# Patient Record
Sex: Female | Born: 1945
Health system: Southern US, Community
[De-identification: ages and names within clinical notes are randomized; demographics above are authoritative.]

## PROBLEM LIST (undated history)

## (undated) DIAGNOSIS — R7303 Prediabetes: Secondary | ICD-10-CM

## (undated) DIAGNOSIS — I219 Acute myocardial infarction, unspecified: Secondary | ICD-10-CM

## (undated) DIAGNOSIS — F419 Anxiety disorder, unspecified: Secondary | ICD-10-CM

## (undated) DIAGNOSIS — S92901A Unspecified fracture of right foot, initial encounter for closed fracture: Secondary | ICD-10-CM

## (undated) DIAGNOSIS — R42 Dizziness and giddiness: Secondary | ICD-10-CM

## (undated) DIAGNOSIS — G4730 Sleep apnea, unspecified: Secondary | ICD-10-CM

## (undated) DIAGNOSIS — K219 Gastro-esophageal reflux disease without esophagitis: Secondary | ICD-10-CM

## (undated) DIAGNOSIS — M1990 Unspecified osteoarthritis, unspecified site: Secondary | ICD-10-CM

## (undated) DIAGNOSIS — E039 Hypothyroidism, unspecified: Secondary | ICD-10-CM

## (undated) DIAGNOSIS — E079 Disorder of thyroid, unspecified: Secondary | ICD-10-CM

## (undated) DIAGNOSIS — I2510 Atherosclerotic heart disease of native coronary artery without angina pectoris: Secondary | ICD-10-CM

## (undated) DIAGNOSIS — H04123 Dry eye syndrome of bilateral lacrimal glands: Secondary | ICD-10-CM

## (undated) DIAGNOSIS — E785 Hyperlipidemia, unspecified: Secondary | ICD-10-CM

## (undated) DIAGNOSIS — Z9889 Other specified postprocedural states: Secondary | ICD-10-CM

## (undated) DIAGNOSIS — Z8639 Personal history of other endocrine, nutritional and metabolic disease: Secondary | ICD-10-CM

## (undated) DIAGNOSIS — I10 Essential (primary) hypertension: Secondary | ICD-10-CM

## (undated) DIAGNOSIS — M545 Low back pain: Secondary | ICD-10-CM

## (undated) DIAGNOSIS — J439 Emphysema, unspecified: Secondary | ICD-10-CM

## (undated) DIAGNOSIS — N200 Calculus of kidney: Secondary | ICD-10-CM

## (undated) DIAGNOSIS — Z98811 Dental restoration status: Secondary | ICD-10-CM

## (undated) DIAGNOSIS — M5136 Other intervertebral disc degeneration, lumbar region: Secondary | ICD-10-CM

## (undated) DIAGNOSIS — T7840XA Allergy, unspecified, initial encounter: Secondary | ICD-10-CM

## (undated) DIAGNOSIS — M797 Fibromyalgia: Secondary | ICD-10-CM

## (undated) DIAGNOSIS — M5432 Sciatica, left side: Secondary | ICD-10-CM

## (undated) DIAGNOSIS — M069 Rheumatoid arthritis, unspecified: Secondary | ICD-10-CM

## (undated) DIAGNOSIS — G8929 Other chronic pain: Secondary | ICD-10-CM

## (undated) DIAGNOSIS — M7581 Other shoulder lesions, right shoulder: Secondary | ICD-10-CM

## (undated) HISTORY — PX: DILATION AND CURETTAGE OF UTERUS: SHX78

## (undated) HISTORY — DX: Dry eye syndrome of bilateral lacrimal glands: H04.123

## (undated) HISTORY — DX: Rheumatoid arthritis, unspecified: M06.9

## (undated) HISTORY — DX: Calculus of kidney: N20.0

## (undated) HISTORY — PX: ABDOMINAL HYSTERECTOMY: SHX81

## (undated) HISTORY — PX: BREAST BIOPSY: SHX20

## (undated) HISTORY — DX: Hyperlipidemia, unspecified: E78.5

## (undated) HISTORY — PX: BREAST CYST ASPIRATION: SHX578

## (undated) HISTORY — PX: TOOTH EXTRACTION: SUR596

## (undated) HISTORY — DX: Emphysema, unspecified: J43.9

## (undated) HISTORY — DX: Disorder of thyroid, unspecified: E07.9

## (undated) HISTORY — PX: EYE SURGERY: SHX253

## (undated) HISTORY — DX: Other intervertebral disc degeneration, lumbar region: M51.36

## (undated) HISTORY — PX: BREAST EXCISIONAL BIOPSY: SUR124

## (undated) HISTORY — PX: APPENDECTOMY: SHX54

## (undated) HISTORY — PX: OTHER SURGICAL HISTORY: SHX169

## (undated) HISTORY — DX: Acute myocardial infarction, unspecified: I21.9

## (undated) HISTORY — DX: Other shoulder lesions, right shoulder: M75.81

## (undated) HISTORY — DX: Gastro-esophageal reflux disease without esophagitis: K21.9

## (undated) HISTORY — DX: Unspecified osteoarthritis, unspecified site: M19.90

## (undated) HISTORY — PX: BUNIONECTOMY: SHX129

## (undated) HISTORY — DX: Other chronic pain: G89.29

## (undated) HISTORY — DX: Fibromyalgia: M79.7

## (undated) HISTORY — DX: Other specified postprocedural states: Z98.890

## (undated) HISTORY — DX: Allergy, unspecified, initial encounter: T78.40XA

## (undated) HISTORY — DX: Essential (primary) hypertension: I10

## (undated) HISTORY — DX: Low back pain: M54.5

## (undated) HISTORY — DX: Unspecified fracture of right foot, initial encounter for closed fracture: S92.901A

---
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
## 2005-04-29 ENCOUNTER — Ambulatory Visit: Payer: Self-pay | Admitting: Internal Medicine

## 2006-04-15 HISTORY — PX: CORONARY ANGIOPLASTY WITH STENT PLACEMENT: SHX49

## 2006-04-22 ENCOUNTER — Other Ambulatory Visit: Payer: Self-pay

## 2006-04-22 ENCOUNTER — Emergency Department: Payer: Self-pay | Admitting: Emergency Medicine

## 2006-04-22 DIAGNOSIS — I219 Acute myocardial infarction, unspecified: Secondary | ICD-10-CM

## 2006-04-22 HISTORY — DX: Acute myocardial infarction, unspecified: I21.9

## 2006-07-12 ENCOUNTER — Encounter: Payer: Self-pay | Admitting: Internal Medicine

## 2008-03-15 HISTORY — PX: COLONOSCOPY: SHX174

## 2008-03-16 LAB — HM COLONOSCOPY: HM COLON: NORMAL

## 2008-04-27 ENCOUNTER — Observation Stay: Payer: Self-pay | Admitting: Internal Medicine

## 2009-07-08 ENCOUNTER — Ambulatory Visit: Payer: Self-pay | Admitting: Internal Medicine

## 2009-11-11 ENCOUNTER — Ambulatory Visit: Payer: Self-pay | Admitting: Internal Medicine

## 2009-11-27 ENCOUNTER — Ambulatory Visit: Payer: Self-pay | Admitting: Pain Medicine

## 2009-12-03 ENCOUNTER — Ambulatory Visit: Payer: Self-pay | Admitting: Pain Medicine

## 2010-01-08 ENCOUNTER — Ambulatory Visit: Payer: Self-pay | Admitting: Pain Medicine

## 2010-01-19 ENCOUNTER — Ambulatory Visit: Payer: Self-pay | Admitting: Pain Medicine

## 2010-02-16 ENCOUNTER — Ambulatory Visit: Payer: Self-pay | Admitting: Pain Medicine

## 2011-01-11 ENCOUNTER — Ambulatory Visit: Payer: Self-pay | Admitting: Pain Medicine

## 2011-02-11 ENCOUNTER — Ambulatory Visit: Payer: Self-pay | Admitting: Pain Medicine

## 2011-02-22 ENCOUNTER — Ambulatory Visit: Payer: Self-pay | Admitting: Pain Medicine

## 2011-05-10 DIAGNOSIS — R21 Rash and other nonspecific skin eruption: Secondary | ICD-10-CM | POA: Diagnosis not present

## 2011-05-10 DIAGNOSIS — L0100 Impetigo, unspecified: Secondary | ICD-10-CM | POA: Diagnosis not present

## 2011-06-07 DIAGNOSIS — R21 Rash and other nonspecific skin eruption: Secondary | ICD-10-CM | POA: Diagnosis not present

## 2011-06-07 DIAGNOSIS — L708 Other acne: Secondary | ICD-10-CM | POA: Diagnosis not present

## 2011-06-07 DIAGNOSIS — L408 Other psoriasis: Secondary | ICD-10-CM | POA: Diagnosis not present

## 2011-06-07 DIAGNOSIS — L723 Sebaceous cyst: Secondary | ICD-10-CM | POA: Diagnosis not present

## 2011-09-01 DIAGNOSIS — I10 Essential (primary) hypertension: Secondary | ICD-10-CM | POA: Diagnosis not present

## 2011-09-01 DIAGNOSIS — E782 Mixed hyperlipidemia: Secondary | ICD-10-CM | POA: Diagnosis not present

## 2011-09-01 DIAGNOSIS — Z0000 Encounter for general adult medical examination without abnormal findings: Secondary | ICD-10-CM | POA: Diagnosis not present

## 2011-09-08 DIAGNOSIS — Z23 Encounter for immunization: Secondary | ICD-10-CM | POA: Diagnosis not present

## 2011-09-08 DIAGNOSIS — G894 Chronic pain syndrome: Secondary | ICD-10-CM | POA: Diagnosis not present

## 2011-09-08 DIAGNOSIS — M19049 Primary osteoarthritis, unspecified hand: Secondary | ICD-10-CM | POA: Diagnosis not present

## 2011-09-08 DIAGNOSIS — Z0000 Encounter for general adult medical examination without abnormal findings: Secondary | ICD-10-CM | POA: Diagnosis not present

## 2011-11-22 DIAGNOSIS — R0602 Shortness of breath: Secondary | ICD-10-CM | POA: Diagnosis not present

## 2011-11-22 DIAGNOSIS — I2510 Atherosclerotic heart disease of native coronary artery without angina pectoris: Secondary | ICD-10-CM | POA: Diagnosis not present

## 2011-11-22 DIAGNOSIS — I252 Old myocardial infarction: Secondary | ICD-10-CM | POA: Diagnosis not present

## 2011-11-22 DIAGNOSIS — I119 Hypertensive heart disease without heart failure: Secondary | ICD-10-CM | POA: Diagnosis not present

## 2011-12-02 DIAGNOSIS — R0602 Shortness of breath: Secondary | ICD-10-CM | POA: Diagnosis not present

## 2011-12-20 DIAGNOSIS — I059 Rheumatic mitral valve disease, unspecified: Secondary | ICD-10-CM | POA: Diagnosis not present

## 2011-12-20 DIAGNOSIS — I2510 Atherosclerotic heart disease of native coronary artery without angina pectoris: Secondary | ICD-10-CM | POA: Diagnosis not present

## 2011-12-20 DIAGNOSIS — E782 Mixed hyperlipidemia: Secondary | ICD-10-CM | POA: Diagnosis not present

## 2011-12-20 DIAGNOSIS — I10 Essential (primary) hypertension: Secondary | ICD-10-CM | POA: Diagnosis not present

## 2012-01-31 DIAGNOSIS — H16229 Keratoconjunctivitis sicca, not specified as Sjogren's, unspecified eye: Secondary | ICD-10-CM | POA: Diagnosis not present

## 2012-01-31 DIAGNOSIS — H43399 Other vitreous opacities, unspecified eye: Secondary | ICD-10-CM | POA: Diagnosis not present

## 2012-06-14 DIAGNOSIS — H16299 Other keratoconjunctivitis, unspecified eye: Secondary | ICD-10-CM | POA: Diagnosis not present

## 2012-06-19 DIAGNOSIS — H16229 Keratoconjunctivitis sicca, not specified as Sjogren's, unspecified eye: Secondary | ICD-10-CM | POA: Diagnosis not present

## 2012-06-27 DIAGNOSIS — H16209 Unspecified keratoconjunctivitis, unspecified eye: Secondary | ICD-10-CM | POA: Diagnosis not present

## 2012-07-10 DIAGNOSIS — I119 Hypertensive heart disease without heart failure: Secondary | ICD-10-CM | POA: Diagnosis not present

## 2012-07-10 DIAGNOSIS — I2510 Atherosclerotic heart disease of native coronary artery without angina pectoris: Secondary | ICD-10-CM | POA: Diagnosis not present

## 2012-07-10 DIAGNOSIS — E782 Mixed hyperlipidemia: Secondary | ICD-10-CM | POA: Diagnosis not present

## 2012-07-18 ENCOUNTER — Ambulatory Visit: Payer: Self-pay | Admitting: Family Medicine

## 2012-07-18 DIAGNOSIS — M25579 Pain in unspecified ankle and joints of unspecified foot: Secondary | ICD-10-CM | POA: Diagnosis not present

## 2012-07-18 DIAGNOSIS — S93409A Sprain of unspecified ligament of unspecified ankle, initial encounter: Secondary | ICD-10-CM | POA: Diagnosis not present

## 2012-07-24 DIAGNOSIS — H16109 Unspecified superficial keratitis, unspecified eye: Secondary | ICD-10-CM | POA: Diagnosis not present

## 2012-08-09 DIAGNOSIS — H16109 Unspecified superficial keratitis, unspecified eye: Secondary | ICD-10-CM | POA: Diagnosis not present

## 2012-08-22 DIAGNOSIS — H16109 Unspecified superficial keratitis, unspecified eye: Secondary | ICD-10-CM | POA: Diagnosis not present

## 2012-09-08 DIAGNOSIS — E039 Hypothyroidism, unspecified: Secondary | ICD-10-CM | POA: Insufficient documentation

## 2012-09-20 DIAGNOSIS — E039 Hypothyroidism, unspecified: Secondary | ICD-10-CM | POA: Diagnosis not present

## 2012-09-22 DIAGNOSIS — Z23 Encounter for immunization: Secondary | ICD-10-CM | POA: Diagnosis not present

## 2012-09-22 DIAGNOSIS — E039 Hypothyroidism, unspecified: Secondary | ICD-10-CM | POA: Diagnosis not present

## 2012-09-22 DIAGNOSIS — M19049 Primary osteoarthritis, unspecified hand: Secondary | ICD-10-CM | POA: Insufficient documentation

## 2012-09-22 DIAGNOSIS — E782 Mixed hyperlipidemia: Secondary | ICD-10-CM | POA: Insufficient documentation

## 2012-09-22 DIAGNOSIS — I2510 Atherosclerotic heart disease of native coronary artery without angina pectoris: Secondary | ICD-10-CM | POA: Diagnosis not present

## 2012-09-22 DIAGNOSIS — Z0000 Encounter for general adult medical examination without abnormal findings: Secondary | ICD-10-CM | POA: Diagnosis not present

## 2012-09-22 DIAGNOSIS — E785 Hyperlipidemia, unspecified: Secondary | ICD-10-CM | POA: Diagnosis not present

## 2012-09-24 DIAGNOSIS — M1810 Unilateral primary osteoarthritis of first carpometacarpal joint, unspecified hand: Secondary | ICD-10-CM | POA: Insufficient documentation

## 2012-10-11 ENCOUNTER — Ambulatory Visit: Payer: Self-pay | Admitting: Internal Medicine

## 2012-10-11 DIAGNOSIS — Z1231 Encounter for screening mammogram for malignant neoplasm of breast: Secondary | ICD-10-CM | POA: Diagnosis not present

## 2013-01-02 DIAGNOSIS — I10 Essential (primary) hypertension: Secondary | ICD-10-CM | POA: Diagnosis not present

## 2013-01-02 DIAGNOSIS — I059 Rheumatic mitral valve disease, unspecified: Secondary | ICD-10-CM | POA: Diagnosis not present

## 2013-01-02 DIAGNOSIS — I2510 Atherosclerotic heart disease of native coronary artery without angina pectoris: Secondary | ICD-10-CM | POA: Diagnosis not present

## 2013-01-02 DIAGNOSIS — E785 Hyperlipidemia, unspecified: Secondary | ICD-10-CM | POA: Diagnosis not present

## 2013-07-03 DIAGNOSIS — H6120 Impacted cerumen, unspecified ear: Secondary | ICD-10-CM | POA: Diagnosis not present

## 2013-07-17 DIAGNOSIS — IMO0001 Reserved for inherently not codable concepts without codable children: Secondary | ICD-10-CM | POA: Diagnosis not present

## 2013-07-17 DIAGNOSIS — M159 Polyosteoarthritis, unspecified: Secondary | ICD-10-CM | POA: Diagnosis not present

## 2013-07-17 DIAGNOSIS — E559 Vitamin D deficiency, unspecified: Secondary | ICD-10-CM | POA: Diagnosis not present

## 2013-07-17 DIAGNOSIS — E039 Hypothyroidism, unspecified: Secondary | ICD-10-CM | POA: Diagnosis not present

## 2013-08-29 DIAGNOSIS — J018 Other acute sinusitis: Secondary | ICD-10-CM | POA: Diagnosis not present

## 2013-08-29 DIAGNOSIS — R05 Cough: Secondary | ICD-10-CM | POA: Diagnosis not present

## 2013-08-29 DIAGNOSIS — R059 Cough, unspecified: Secondary | ICD-10-CM | POA: Diagnosis not present

## 2013-09-25 DIAGNOSIS — M19049 Primary osteoarthritis, unspecified hand: Secondary | ICD-10-CM | POA: Diagnosis not present

## 2013-09-25 DIAGNOSIS — M2550 Pain in unspecified joint: Secondary | ICD-10-CM | POA: Diagnosis not present

## 2013-09-25 DIAGNOSIS — G8929 Other chronic pain: Secondary | ICD-10-CM | POA: Diagnosis not present

## 2013-11-12 DIAGNOSIS — H2510 Age-related nuclear cataract, unspecified eye: Secondary | ICD-10-CM | POA: Diagnosis not present

## 2013-11-12 DIAGNOSIS — H5210 Myopia, unspecified eye: Secondary | ICD-10-CM | POA: Diagnosis not present

## 2013-11-28 DIAGNOSIS — I10 Essential (primary) hypertension: Secondary | ICD-10-CM | POA: Diagnosis not present

## 2013-11-28 DIAGNOSIS — I2510 Atherosclerotic heart disease of native coronary artery without angina pectoris: Secondary | ICD-10-CM | POA: Diagnosis not present

## 2013-11-28 DIAGNOSIS — E785 Hyperlipidemia, unspecified: Secondary | ICD-10-CM | POA: Diagnosis not present

## 2013-12-18 DIAGNOSIS — I2510 Atherosclerotic heart disease of native coronary artery without angina pectoris: Secondary | ICD-10-CM | POA: Diagnosis not present

## 2013-12-18 DIAGNOSIS — E785 Hyperlipidemia, unspecified: Secondary | ICD-10-CM | POA: Diagnosis not present

## 2013-12-18 DIAGNOSIS — K219 Gastro-esophageal reflux disease without esophagitis: Secondary | ICD-10-CM | POA: Diagnosis not present

## 2013-12-18 DIAGNOSIS — N6019 Diffuse cystic mastopathy of unspecified breast: Secondary | ICD-10-CM | POA: Diagnosis not present

## 2013-12-18 DIAGNOSIS — M19041 Primary osteoarthritis, right hand: Secondary | ICD-10-CM | POA: Diagnosis not present

## 2013-12-18 DIAGNOSIS — M797 Fibromyalgia: Secondary | ICD-10-CM | POA: Diagnosis not present

## 2013-12-18 DIAGNOSIS — E890 Postprocedural hypothyroidism: Secondary | ICD-10-CM | POA: Diagnosis not present

## 2013-12-18 DIAGNOSIS — Z23 Encounter for immunization: Secondary | ICD-10-CM | POA: Diagnosis not present

## 2013-12-18 DIAGNOSIS — I10 Essential (primary) hypertension: Secondary | ICD-10-CM | POA: Diagnosis not present

## 2013-12-25 DIAGNOSIS — M150 Primary generalized (osteo)arthritis: Secondary | ICD-10-CM | POA: Diagnosis not present

## 2013-12-25 DIAGNOSIS — M797 Fibromyalgia: Secondary | ICD-10-CM | POA: Diagnosis not present

## 2014-01-02 DIAGNOSIS — M25512 Pain in left shoulder: Secondary | ICD-10-CM | POA: Diagnosis not present

## 2014-01-09 DIAGNOSIS — M6281 Muscle weakness (generalized): Secondary | ICD-10-CM | POA: Diagnosis not present

## 2014-01-09 DIAGNOSIS — M25512 Pain in left shoulder: Secondary | ICD-10-CM | POA: Diagnosis not present

## 2014-01-09 DIAGNOSIS — M25612 Stiffness of left shoulder, not elsewhere classified: Secondary | ICD-10-CM | POA: Diagnosis not present

## 2014-01-14 DIAGNOSIS — M6281 Muscle weakness (generalized): Secondary | ICD-10-CM | POA: Diagnosis not present

## 2014-01-14 DIAGNOSIS — M25512 Pain in left shoulder: Secondary | ICD-10-CM | POA: Diagnosis not present

## 2014-01-14 DIAGNOSIS — M25612 Stiffness of left shoulder, not elsewhere classified: Secondary | ICD-10-CM | POA: Diagnosis not present

## 2014-01-17 DIAGNOSIS — M25512 Pain in left shoulder: Secondary | ICD-10-CM | POA: Diagnosis not present

## 2014-01-17 DIAGNOSIS — M6281 Muscle weakness (generalized): Secondary | ICD-10-CM | POA: Diagnosis not present

## 2014-01-17 DIAGNOSIS — M25612 Stiffness of left shoulder, not elsewhere classified: Secondary | ICD-10-CM | POA: Diagnosis not present

## 2014-01-22 DIAGNOSIS — M6281 Muscle weakness (generalized): Secondary | ICD-10-CM | POA: Diagnosis not present

## 2014-01-22 DIAGNOSIS — M25512 Pain in left shoulder: Secondary | ICD-10-CM | POA: Diagnosis not present

## 2014-01-22 DIAGNOSIS — M25612 Stiffness of left shoulder, not elsewhere classified: Secondary | ICD-10-CM | POA: Diagnosis not present

## 2014-01-25 DIAGNOSIS — M6281 Muscle weakness (generalized): Secondary | ICD-10-CM | POA: Diagnosis not present

## 2014-01-25 DIAGNOSIS — M25612 Stiffness of left shoulder, not elsewhere classified: Secondary | ICD-10-CM | POA: Diagnosis not present

## 2014-01-25 DIAGNOSIS — M25512 Pain in left shoulder: Secondary | ICD-10-CM | POA: Diagnosis not present

## 2014-01-28 DIAGNOSIS — M25612 Stiffness of left shoulder, not elsewhere classified: Secondary | ICD-10-CM | POA: Diagnosis not present

## 2014-01-28 DIAGNOSIS — M6281 Muscle weakness (generalized): Secondary | ICD-10-CM | POA: Diagnosis not present

## 2014-01-28 DIAGNOSIS — M25512 Pain in left shoulder: Secondary | ICD-10-CM | POA: Diagnosis not present

## 2014-01-30 DIAGNOSIS — E785 Hyperlipidemia, unspecified: Secondary | ICD-10-CM | POA: Diagnosis not present

## 2014-01-30 DIAGNOSIS — K219 Gastro-esophageal reflux disease without esophagitis: Secondary | ICD-10-CM | POA: Diagnosis not present

## 2014-01-30 DIAGNOSIS — E890 Postprocedural hypothyroidism: Secondary | ICD-10-CM | POA: Diagnosis not present

## 2014-01-30 DIAGNOSIS — Z0000 Encounter for general adult medical examination without abnormal findings: Secondary | ICD-10-CM | POA: Diagnosis not present

## 2014-01-30 DIAGNOSIS — I10 Essential (primary) hypertension: Secondary | ICD-10-CM | POA: Diagnosis not present

## 2014-01-30 DIAGNOSIS — Z23 Encounter for immunization: Secondary | ICD-10-CM | POA: Diagnosis not present

## 2014-01-30 LAB — LIPID PANEL
Cholesterol: 148 mg/dL (ref 0–200)
HDL: 46 mg/dL (ref 35–70)
LDL CALC: 73 mg/dL
TRIGLYCERIDES: 143 mg/dL (ref 40–160)

## 2014-01-30 LAB — BASIC METABOLIC PANEL
BUN: 23 mg/dL — AB (ref 4–21)
CREATININE: 0.9 mg/dL (ref <=1.1)

## 2014-01-30 LAB — CBC AND DIFFERENTIAL: Hemoglobin: 13.3 g/dL (ref 12.0–16.0)

## 2014-01-30 LAB — TSH: TSH: 1.52 u[IU]/mL (ref <=5.90)

## 2014-01-31 DIAGNOSIS — M25612 Stiffness of left shoulder, not elsewhere classified: Secondary | ICD-10-CM | POA: Diagnosis not present

## 2014-01-31 DIAGNOSIS — M25512 Pain in left shoulder: Secondary | ICD-10-CM | POA: Diagnosis not present

## 2014-02-05 DIAGNOSIS — M6281 Muscle weakness (generalized): Secondary | ICD-10-CM | POA: Diagnosis not present

## 2014-02-05 DIAGNOSIS — M25512 Pain in left shoulder: Secondary | ICD-10-CM | POA: Diagnosis not present

## 2014-02-13 DIAGNOSIS — R05 Cough: Secondary | ICD-10-CM | POA: Diagnosis not present

## 2014-02-13 DIAGNOSIS — J329 Chronic sinusitis, unspecified: Secondary | ICD-10-CM | POA: Diagnosis not present

## 2014-02-13 DIAGNOSIS — A499 Bacterial infection, unspecified: Secondary | ICD-10-CM | POA: Diagnosis not present

## 2014-03-20 ENCOUNTER — Ambulatory Visit: Payer: Self-pay | Admitting: Internal Medicine

## 2014-03-20 DIAGNOSIS — Z1231 Encounter for screening mammogram for malignant neoplasm of breast: Secondary | ICD-10-CM | POA: Diagnosis not present

## 2014-03-20 LAB — HM MAMMOGRAPHY: HM MAMMO: NORMAL

## 2014-05-07 DIAGNOSIS — B9689 Other specified bacterial agents as the cause of diseases classified elsewhere: Secondary | ICD-10-CM | POA: Diagnosis not present

## 2014-05-07 DIAGNOSIS — J329 Chronic sinusitis, unspecified: Secondary | ICD-10-CM | POA: Diagnosis not present

## 2014-06-05 DIAGNOSIS — L0291 Cutaneous abscess, unspecified: Secondary | ICD-10-CM | POA: Diagnosis not present

## 2014-06-05 DIAGNOSIS — L720 Epidermal cyst: Secondary | ICD-10-CM | POA: Diagnosis not present

## 2014-06-05 DIAGNOSIS — L300 Nummular dermatitis: Secondary | ICD-10-CM | POA: Diagnosis not present

## 2014-06-11 DIAGNOSIS — M150 Primary generalized (osteo)arthritis: Secondary | ICD-10-CM | POA: Diagnosis not present

## 2014-06-11 DIAGNOSIS — M797 Fibromyalgia: Secondary | ICD-10-CM | POA: Diagnosis not present

## 2014-07-01 DIAGNOSIS — I10 Essential (primary) hypertension: Secondary | ICD-10-CM | POA: Insufficient documentation

## 2014-07-09 DIAGNOSIS — I6523 Occlusion and stenosis of bilateral carotid arteries: Secondary | ICD-10-CM | POA: Diagnosis not present

## 2014-07-09 DIAGNOSIS — E782 Mixed hyperlipidemia: Secondary | ICD-10-CM | POA: Diagnosis not present

## 2014-07-09 DIAGNOSIS — I6529 Occlusion and stenosis of unspecified carotid artery: Secondary | ICD-10-CM | POA: Insufficient documentation

## 2014-07-09 DIAGNOSIS — I10 Essential (primary) hypertension: Secondary | ICD-10-CM | POA: Diagnosis not present

## 2014-07-09 DIAGNOSIS — I2510 Atherosclerotic heart disease of native coronary artery without angina pectoris: Secondary | ICD-10-CM | POA: Diagnosis not present

## 2014-07-16 DIAGNOSIS — L300 Nummular dermatitis: Secondary | ICD-10-CM | POA: Diagnosis not present

## 2014-07-16 DIAGNOSIS — L720 Epidermal cyst: Secondary | ICD-10-CM | POA: Diagnosis not present

## 2014-07-16 DIAGNOSIS — D485 Neoplasm of uncertain behavior of skin: Secondary | ICD-10-CM | POA: Diagnosis not present

## 2014-07-30 DIAGNOSIS — J019 Acute sinusitis, unspecified: Secondary | ICD-10-CM | POA: Diagnosis not present

## 2014-07-30 DIAGNOSIS — M150 Primary generalized (osteo)arthritis: Secondary | ICD-10-CM | POA: Diagnosis not present

## 2014-07-30 DIAGNOSIS — M797 Fibromyalgia: Secondary | ICD-10-CM | POA: Diagnosis not present

## 2014-08-28 ENCOUNTER — Telehealth: Payer: Self-pay | Admitting: Pain Medicine

## 2014-08-28 NOTE — Telephone Encounter (Signed)
Left vm fpr pt to call to [facility] with dr crisp. Pt papers in crisp vm folder

## 2014-10-02 ENCOUNTER — Other Ambulatory Visit: Payer: Self-pay | Admitting: Pain Medicine

## 2014-10-02 ENCOUNTER — Encounter: Payer: Self-pay | Admitting: Pain Medicine

## 2014-10-02 ENCOUNTER — Ambulatory Visit: Payer: Self-pay | Admitting: Pain Medicine

## 2014-10-02 ENCOUNTER — Ambulatory Visit: Payer: Medicare Other | Attending: Pain Medicine | Admitting: Pain Medicine

## 2014-10-02 VITALS — BP 111/62 | HR 77 | Temp 97.5°F | Resp 14 | Ht 68.0 in | Wt 172.0 lb

## 2014-10-02 DIAGNOSIS — M5430 Sciatica, unspecified side: Secondary | ICD-10-CM | POA: Diagnosis not present

## 2014-10-02 DIAGNOSIS — E039 Hypothyroidism, unspecified: Secondary | ICD-10-CM | POA: Diagnosis not present

## 2014-10-02 DIAGNOSIS — M5416 Radiculopathy, lumbar region: Secondary | ICD-10-CM | POA: Diagnosis not present

## 2014-10-02 DIAGNOSIS — M069 Rheumatoid arthritis, unspecified: Secondary | ICD-10-CM | POA: Insufficient documentation

## 2014-10-02 DIAGNOSIS — K219 Gastro-esophageal reflux disease without esophagitis: Secondary | ICD-10-CM | POA: Diagnosis not present

## 2014-10-02 DIAGNOSIS — M51369 Other intervertebral disc degeneration, lumbar region without mention of lumbar back pain or lower extremity pain: Secondary | ICD-10-CM

## 2014-10-02 DIAGNOSIS — M47817 Spondylosis without myelopathy or radiculopathy, lumbosacral region: Secondary | ICD-10-CM | POA: Diagnosis not present

## 2014-10-02 DIAGNOSIS — G588 Other specified mononeuropathies: Secondary | ICD-10-CM | POA: Insufficient documentation

## 2014-10-02 DIAGNOSIS — M797 Fibromyalgia: Secondary | ICD-10-CM | POA: Diagnosis not present

## 2014-10-02 DIAGNOSIS — M47816 Spondylosis without myelopathy or radiculopathy, lumbar region: Secondary | ICD-10-CM | POA: Insufficient documentation

## 2014-10-02 DIAGNOSIS — M5136 Other intervertebral disc degeneration, lumbar region: Secondary | ICD-10-CM

## 2014-10-02 DIAGNOSIS — M792 Neuralgia and neuritis, unspecified: Secondary | ICD-10-CM | POA: Diagnosis not present

## 2014-10-02 DIAGNOSIS — I252 Old myocardial infarction: Secondary | ICD-10-CM | POA: Diagnosis not present

## 2014-10-02 DIAGNOSIS — M1990 Unspecified osteoarthritis, unspecified site: Secondary | ICD-10-CM | POA: Insufficient documentation

## 2014-10-02 DIAGNOSIS — M533 Sacrococcygeal disorders, not elsewhere classified: Secondary | ICD-10-CM | POA: Insufficient documentation

## 2014-10-02 HISTORY — DX: Other intervertebral disc degeneration, lumbar region without mention of lumbar back pain or lower extremity pain: M51.369

## 2014-10-02 HISTORY — DX: Other intervertebral disc degeneration, lumbar region: M51.36

## 2014-10-02 NOTE — Progress Notes (Signed)
Subjective:    Patient ID: Carrie Myers, female    DOB: 1946/01/10, 69 y.o.   MRN: [id number]  HPI  The patient is 69 year old female who comes to pain management Center at request of Dr. Sunday Shams further evaluation and treatment of pain involving multiple regions. The patient has diagnosis of fibromyalgia as well as arthritis and has known degenerative changes of the lumbar spine with prior history of sciatica. Patient states that her most bothersome pain involves multiple regions which she attributes to her fibromyalgia and arthritis as well as component of lower back and lower extremity pain which appeared to be due to her dentist degenerative disc disease of the lumbar spine. The patient described her pain as aching constant deep disabling exhausting pain that she she felt was becoming more sick severe. The patient stated the pain was nagging sharp shooting tender throbbing uncomfortable sensation that awakens patient from sleep and interferes with ability to go to sleep. Patient stated the pain also associated with numbness in ability to concentrate and fatigue. The patient stated the pain increased with motion sitting standing squatting stooping walking bending climbing kneeling lifting. Patient stated the pain decreased to some degree with resting sitting sleeping lying down and medications. We discussed patient's condition and will review additional records and consider patient for modifications of medications as well as interventional treatment. We will also consider further consultations. Patient stated that she had significant pain involving the right hand which involves the thumb. We discussed patient undergoing evaluation by hand surgeon to address this issue. At the present time we will consider performing lumbar facet, medial branch nerve, blocks at time of return appointment. The patient was understanding and in agreement status treatment plan.    Review of  Systems Cardiovascular Myocardial infarction 2008  Pulmonary Patient has been told she snores  Neurological Unremarkable  Psychological Unremarkable  Gastrointestinal  Gastroesophageal reflux disease  Genitourinary Kidney stones  Hematological Easy bruisability Patient denies bleeding disorder  Endocrine Hypothyroidism  Rheumatological Osteoarthritis Rheumatoid arthritis Fibromyalgia  Musculoskeletal Unremarkable  Other significant Unremarkable        Objective:   Physical Exam  There was tends to palpation of paraspinal musculature region cervical region cervical facet region palpation of which reproduced pain of mild to moderate degree. There was mild to moderate tends to palpation of the splenius capitis and occipitalis musculature region. Palpation over the thoracic facet thoracic paraspinal musculature region reproduced moderate discomfort. No crepitus of the thoracic region was noted. Palpation over the lower thoracic region was with moderate tends to palpation. Patient appeared to be with unremarkable Spurling's maneuver. Tinel and Phalen's maneuver associated with increased pain with nodules at the distal interphalangeal joints of the hand without increased warmth or erythema there was subluxation of joints with involvement of the thumb with tenderness to palpation at base of thumb warmth or erythema noted. There was decreased grip strength on the right especially compared to the left. Patient over the lumbar facet lumbar paraspinal musculature region was associated increased pain of moderate to moderately severe degree with lateral bending and rotation reproducing moderately severe discomfort. Palpation over the region of the PSIS and PII S region reproduced moderate discomfort. Palpation of the gluteal and piriformis musculature region reproduced mild discomfort. There was moderate tends to palpation of the greater trochanteric region iliotibial band region.  Straight leg raising tolerates approximately 30 without a definite increase of pain with dorsiflexion noted. There was negative clonus negative Homans. No definite sensory deficit of dermatomal  distribution detected. Abdomen nontender with no costovertebral angle tenderness noted      Assessment & Plan:   Degenerative disc disease lumbar spine  Lumbar facet syndrome  Sciatica (history of)  Fibromyalgia  Osteoarthritis  Rheumatoid arthritis   Plan  Continue present medications Ultram, diclofenac sodium misoprostol, Flexeril, Valium, Celexa and other medications as prescribed at this time   F/U PCP Dr. Halina Maidens for evaliation of  BP and general medical  Condition.  F/U rheumatological evaluation with Dr. Sunday Shams  F/U surgical evaluation. We discussed surgical evaluations including evaluations of the lumbar lower extremity region as well as evaluation of the hands   F/U neurological evaluation  May consider radiofrequency rhizolysis or intraspinal procedures pending response to present treatment and F/U evaluation.  Patient to call Pain Management Center should patient have concerns prior to scheduled return appointment.   Nephrolithiasis

## 2014-10-02 NOTE — Patient Instructions (Addendum)
Continue present medications tramadol, cyclobenzaprine, Celexa, and diclofenac  Lumbar facet, medial branch nerve, blocks to be performed at time of return appointment 10/23/2014  F/U PCP Dr.Belhorn   for evaliation of  BP and general medical  condition.  F/U surgical evaluation  F/u Dr Nehemiah Massed  F/U neurological evaluation as discussed  Rheumatological evaluation as discussed  May consider radiofrequency rhizolysis or intraspinal procedures pending response to present treatment and F/U evaluation.  Patient to call Pain Management Center should patient have concerns prior to scheduled return appointment.

## 2014-10-02 NOTE — Progress Notes (Signed)
Safety precautions to be maintained throughout the outpatient stay will include: orient to surroundings, keep bed in low position, maintain call bell within reach at all times, provide assistance with transfer out of bed and ambulation.  

## 2014-10-17 ENCOUNTER — Other Ambulatory Visit: Payer: Self-pay | Admitting: Pain Medicine

## 2014-10-22 DIAGNOSIS — H16223 Keratoconjunctivitis sicca, not specified as Sjogren's, bilateral: Secondary | ICD-10-CM | POA: Diagnosis not present

## 2014-10-23 ENCOUNTER — Ambulatory Visit: Payer: Medicare Other | Attending: Pain Medicine | Admitting: Pain Medicine

## 2014-10-23 ENCOUNTER — Encounter: Payer: Self-pay | Admitting: Pain Medicine

## 2014-10-23 VITALS — BP 129/78 | HR 78 | Temp 98.3°F | Resp 16 | Ht 68.0 in | Wt 173.0 lb

## 2014-10-23 DIAGNOSIS — M1990 Unspecified osteoarthritis, unspecified site: Secondary | ICD-10-CM

## 2014-10-23 DIAGNOSIS — G588 Other specified mononeuropathies: Secondary | ICD-10-CM

## 2014-10-23 DIAGNOSIS — M545 Low back pain: Secondary | ICD-10-CM | POA: Diagnosis present

## 2014-10-23 DIAGNOSIS — M47816 Spondylosis without myelopathy or radiculopathy, lumbar region: Secondary | ICD-10-CM | POA: Insufficient documentation

## 2014-10-23 DIAGNOSIS — M533 Sacrococcygeal disorders, not elsewhere classified: Secondary | ICD-10-CM

## 2014-10-23 DIAGNOSIS — M79605 Pain in left leg: Secondary | ICD-10-CM | POA: Diagnosis present

## 2014-10-23 DIAGNOSIS — M79604 Pain in right leg: Secondary | ICD-10-CM | POA: Diagnosis present

## 2014-10-23 DIAGNOSIS — M5136 Other intervertebral disc degeneration, lumbar region: Secondary | ICD-10-CM

## 2014-10-23 DIAGNOSIS — M797 Fibromyalgia: Secondary | ICD-10-CM

## 2014-10-23 DIAGNOSIS — M47817 Spondylosis without myelopathy or radiculopathy, lumbosacral region: Secondary | ICD-10-CM | POA: Diagnosis not present

## 2014-10-23 MED ORDER — MIDAZOLAM HCL 5 MG/5ML IJ SOLN
INTRAMUSCULAR | Status: AC
  Administered 2014-10-23: 2 mg via INTRAVENOUS
  Filled 2014-10-23: qty 5

## 2014-10-23 MED ORDER — DIAZEPAM 5 MG PO TABS
ORAL_TABLET | ORAL | Status: DC
Start: 2014-10-23 — End: 2014-12-03

## 2014-10-23 MED ORDER — ORPHENADRINE CITRATE 30 MG/ML IJ SOLN
INTRAMUSCULAR | Status: AC
  Administered 2014-10-23: 60 mg
  Filled 2014-10-23: qty 2

## 2014-10-23 MED ORDER — FENTANYL CITRATE (PF) 100 MCG/2ML IJ SOLN
INTRAMUSCULAR | Status: AC
  Administered 2014-10-23: 100 ug via INTRAVENOUS
  Filled 2014-10-23: qty 2

## 2014-10-23 MED ORDER — BUPIVACAINE HCL (PF) 0.25 % IJ SOLN
INTRAMUSCULAR | Status: AC
  Administered 2014-10-23: 30 mL
  Filled 2014-10-23: qty 30

## 2014-10-23 MED ORDER — TRIAMCINOLONE ACETONIDE 40 MG/ML IJ SUSP
INTRAMUSCULAR | Status: AC
  Administered 2014-10-23: 40 mg
  Filled 2014-10-23: qty 1

## 2014-10-23 MED ORDER — HYDROCODONE-ACETAMINOPHEN 5-325 MG PO TABS: ORAL_TABLET | ORAL | Status: DC

## 2014-10-23 NOTE — Progress Notes (Signed)
Subjective:    Patient ID: Carrie Myers, female    DOB: 1945/11/07, 69 y.o.   MRN: [id number]  HPI  PROCEDURE:  Block of nerves to the sacroiliac joint.   NOTE:  The patient is a 69 y.o. female who returns to the [facility] for further evaluation and treatment of pain involving the lower back and lower extremity region with pain in the region of the buttocks as well. Prior studies remultilevel degenerative changes of the lumbar spine. Patient is with reproduction of severe pain with palpation over the PSIS and PII S regions and is with evidence of positive Patrick's maneuver.   There is concern regarding a significant component of the patient's pain being due to sacroiliac joint dysfunction The risks, benefits, expectations of the procedure have been discussed and explained to the patient who is understanding and willing to proceed with interventional treatment in attempt to decrease severity of patient's symptoms, minimize the risk of medication escalation and  hopefully retard the progression of the patient's symptoms. We will proceed with what is felt to be a medically necessary procedure, block of nerves to the sacroiliac joint.   DESCRIPTION OF PROCEDURE:  Block of nerves to the sacroiliac joint.   The patient was taken to the fluoroscopy suite. With the patient in the prone position with EKG, blood pressure, pulse and pulse oximetry monitoring, IV Versed, IV fentanyl conscious sedation, Betadine prep of proposed entry site was performed.   Block of nerves at the L5 vertebral body level.   With the patient in prone position, under fluoroscopic guidance, a 22 -gauge needle was inserted at the L5 vertebral body level on thle left side. With 15 degrees oblique orientation a 22 -gauge needle was inserted in the region known as Burton's eye or eye of the Scotty dog. Following documentation of needle placement in the area of Burton's eye or eye of the Scotty dog under  fluoroscopic guidance, needle placement was then accomplished at the sacral ala level on left side.   Needle placement at the sacral ala.   With the patient in prone position under fluoroscopic guidance with AP view of the lumbosacral spine, a 22 -gauge needle was inserted in the region known as the sacral ala on thleft side. Following documentation of needle placement on the left side under fluoroscopic guidance needle placement was then accomplished at the S1 foramen level.   Needle placement at the S1 foramen level.   With the patient in prone position under fluoroscopic guidance with AP view of the lumbosacral spine and cephalad orientation, a 22 -gauge needle was inserted at the superior and lateral border of the S1 foramen on thleft side. Following documentation of needle placement at the S1 foramen level on the left side, needle placement was then accomplished at the S2 foramen level on thleft side.   Needle placement at the S2 foramen level.   With the patient in prone position with AP view of the lumbosacral spine with cephalad orientation, a 22 - gauge needle was inserted at the superior and lateral border of the S2 foramen under fluoroscopic guidance on thleft side. Following needle placement at the L5 vertebral body level, sacral ala, S1 foramen and S2 foramen on the left side, needle placement was verified on lateral view under fluoroscopic guidance.  Following needle placement documentation on lateral view, each needle was injected with 1 mL of 0.25% bupivacaine and Kenalog.   BLOCK OF THE NERVES TO SACROILIAC JOINT ON THE RIGHT  SIDE The procedure was performed on the right side at the same levels as was performed on the left side and utilizing the same technique as on the left side and was performed under fluoroscopic guidance as on the left side   A total of 10mg  of Kenalog was utilized for the procedure.   PLAN:  1. Medications: The patient will continue presently prescribed  medications the patient was given prescriptions for hydrocodone acetaminophen and Valium on today's visit.  2. The patient will be considered for modification of treatment regimen pending response to the procedure performed on today's visit.  3. The patient is to follow-up with primary care physician Dr. Army Melia  for evaluation of blood pressure and general medical condition following the procedure performed on today's visit.  4. Surgical evaluation as discussed.  5. Neurological evaluation as discussed.  6. The patient may be a candidate for radiofrequency procedures, implantation devices and other treatment pending response to treatment performed on today's visit and follow-up evaluation.  7. The patient has been advised to adhere to proper body mechanics and to avoid activities which may exacerbate the patient's symptoms.   Return appointment to Pain Management Center as scheduled.      Review of Systems     Objective:   Physical Exam        Assessment & Plan:

## 2014-10-23 NOTE — Progress Notes (Signed)
Safety precautions to be maintained throughout the outpatient stay will include: orient to surroundings, keep bed in low position, maintain call bell within reach at all times, provide assistance with transfer out of bed and ambulation.  

## 2014-10-23 NOTE — Patient Instructions (Addendum)
Continue present medications. I will have nurses verify your hydrocodone acetaminophen and Valium doses as well as obtain permission to prescribe your medications and will prescribe these medications today once we obtain permission and verify the doses of the medications  F/U PCP Dr.Berglund  for evaliation of  BP and general medical  condition  F/U surgical evaluation  F/U neurological evaluation  May consider radiofrequency rhizolysis or intraspinal procedures pending response to present treatment and F/U evaluation   Patient to call Pain Management Center should patient have concerns prior to scheduled return appointmen. Pain Management Discharge Instructions  General Discharge Instructions :  If you need to reach your doctor call: Monday-Friday 8:00 am - 4:00 pm at [phone] or toll free [phone].  After clinic hours [phone] to have operator reach doctor.  Bring all of your medication bottles to all your appointments in the pain clinic.  To cancel or reschedule your appointment with Pain Management please remember to call 24 hours in advance to avoid a fee.  Refer to the educational materials which you have been given on: General Risks, I had my Procedure. Discharge Instructions, Post Sedation.  Post Procedure Instructions:  The drugs you were given will stay in your system until tomorrow, so for the next 24 hours you should not drive, make any legal decisions or drink any alcoholic beverages.  You may eat anything you prefer, but it is better to start with liquids then soups and crackers, and gradually work up to solid foods.  Please notify your doctor immediately if you have any unusual bleeding, trouble breathing or pain that is not related to your normal pain.  Depending on the type of procedure that was done, some parts of your body may feel week and/or numb.  This usually clears up by tonight or the next day.  Walk with the use of an assistive device or  accompanied by an adult for the 24 hours.  You may use ice on the affected area for the first 24 hours.  Put ice in a Ziploc bag and cover with a towel and place against area 15 minutes on 15 minutes off.  You may switch to heat after 24 hours.GENERAL RISKS AND COMPLICATIONS  What are the risk, side effects and possible complications? Generally speaking, most procedures are safe.  However, with any procedure there are risks, side effects, and the possibility of complications.  The risks and complications are dependent upon the sites that are lesioned, or the type of nerve block to be performed.  The closer the procedure is to the spine, the more serious the risks are.  Great care is taken when placing the radio frequency needles, block needles or lesioning probes, but sometimes complications can occur. 1. Infection: Any time there is an injection through the skin, there is a risk of infection.  This is why sterile conditions are used for these blocks.  There are four possible types of infection. 1. Localized skin infection. 2. Central Nervous System Infection-This can be in the form of Meningitis, which can be deadly. 3. Epidural Infections-This can be in the form of an epidural abscess, which can cause pressure inside of the spine, causing compression of the spinal cord with subsequent paralysis. This would require an emergency surgery to decompress, and there are no guarantees that the patient would recover from the paralysis. 4. Discitis-This is an infection of the intervertebral discs.  It occurs in about 1% of discography procedures.  It is difficult to treat and it  may lead to surgery.        2. Pain: the needles have to go through skin and soft tissues, will cause soreness.       3. Damage to internal structures:  The nerves to be lesioned may be near blood vessels or    other nerves which can be potentially damaged.       4. Bleeding: Bleeding is more common if the patient is taking blood  thinners such as  aspirin, Coumadin, Ticiid, Plavix, etc., or if he/she have some genetic predisposition  such as hemophilia. Bleeding into the spinal canal can cause compression of the spinal  cord with subsequent paralysis.  This would require an emergency surgery to  decompress and there are no guarantees that the patient would recover from the  paralysis.       5. Pneumothorax:  Puncturing of a lung is a possibility, every time a needle is introduced in  the area of the chest or upper back.  Pneumothorax refers to free air around the  collapsed lung(s), inside of the thoracic cavity (chest cavity).  Another two possible  complications related to a similar event would include: Hemothorax and Chylothorax.   These are variations of the Pneumothorax, where instead of air around the collapsed  lung(s), you may have blood or chyle, respectively.       6. Spinal headaches: They may occur with any procedures in the area of the spine.       7. Persistent CSF (Cerebro-Spinal Fluid) leakage: This is a rare problem, but may occur  with prolonged intrathecal or epidural catheters either due to the formation of a fistulous  track or a dural tear.       8. Nerve damage: By working so close to the spinal cord, there is always a possibility of  nerve damage, which could be as serious as a permanent spinal cord injury with  paralysis.       9. Death:  Although rare, severe deadly allergic reactions known as "Anaphylactic  reaction" can occur to any of the medications used.      10. Worsening of the symptoms:  We can always make thing worse.  What are the chances of something like this happening? Chances of any of this occuring are extremely low.  By statistics, you have more of a chance of getting killed in a motor vehicle accident: while driving to the hospital than any of the above occurring .  Nevertheless, you should be aware that they are possibilities.  In general, it is similar to taking a shower.  Everybody knows  that you can slip, hit your head and get killed.  Does that mean that you should not shower again?  Nevertheless always keep in mind that statistics do not mean anything if you happen to be on the wrong side of them.  Even if a procedure has a 1 (one) in a 1,000,000 (million) chance of going wrong, it you happen to be that one..Also, keep in mind that by statistics, you have more of a chance of having something go wrong when taking medications.  Who should not have this procedure? If you are on a blood thinning medication (e.g. Coumadin, Plavix, see list of "Blood Thinners"), or if you have an active infection going on, you should not have the procedure.  If you are taking any blood thinners, please inform your physician.  How should I prepare for this procedure?  Do not eat or drink anything at  least six hours prior to the procedure.  Bring a driver with you .  It cannot be a taxi.  Come accompanied by an adult that can drive you back, and that is strong enough to help you if your legs get weak or numb from the local anesthetic.  Take all of your medicines the morning of the procedure with just enough water to swallow them.  If you have diabetes, make sure that you are scheduled to have your procedure done first thing in the morning, whenever possible.  If you have diabetes, take only half of your insulin dose and notify our nurse that you have done so as soon as you arrive at the clinic.  If you are diabetic, but only take blood sugar pills (oral hypoglycemic), then do not take them on the morning of your procedure.  You may take them after you have had the procedure.  Do not take aspirin or any aspirin-containing medications, at least eleven (11) days prior to the procedure.  They may prolong bleeding.  Wear loose fitting clothing that may be easy to take off and that you would not mind if it got stained with Betadine or blood.  Do not wear any jewelry or perfume  Remove any nail  coloring.  It will interfere with some of our monitoring equipment.  NOTE: Remember that this is not meant to be interpreted as a complete list of all possible complications.  Unforeseen problems may occur.  BLOOD THINNERS The following drugs contain aspirin or other products, which can cause increased bleeding during surgery and should not be taken for 2 weeks prior to and 1 week after surgery.  If you should need take something for relief of minor pain, you may take acetaminophen which is found in Tylenol,m Datril, Anacin-3 and Panadol. It is not blood thinner. The products listed below are.  Do not take any of the products listed below in addition to any listed on your instruction sheet.  A.P.C or A.P.C with Codeine Codeine Phosphate Capsules #3 Ibuprofen Ridaura  ABC compound Congesprin Imuran rimadil  Advil Cope Indocin Robaxisal  Alka-Seltzer Effervescent Pain Reliever and Antacid Coricidin or Coricidin-D  Indomethacin Rufen  Alka-Seltzer plus Cold Medicine Cosprin Ketoprofen S-A-C Tablets  Anacin Analgesic Tablets or Capsules Coumadin Korlgesic Salflex  Anacin Extra Strength Analgesic tablets or capsules CP-2 Tablets Lanoril Salicylate  Anaprox Cuprimine Capsules Levenox Salocol  Anexsia-D Dalteparin Magan Salsalate  Anodynos Darvon compound Magnesium Salicylate Sine-off  Ansaid Dasin Capsules Magsal Sodium Salicylate  Anturane Depen Capsules Marnal Soma  APF Arthritis pain formula Dewitt's Pills Measurin Stanback  Argesic Dia-Gesic Meclofenamic Sulfinpyrazone  Arthritis Bayer Timed Release Aspirin Diclofenac Meclomen Sulindac  Arthritis pain formula Anacin Dicumarol Medipren Supac  Analgesic (Safety coated) Arthralgen Diffunasal Mefanamic Suprofen  Arthritis Strength Bufferin Dihydrocodeine Mepro Compound Suprol  Arthropan liquid Dopirydamole Methcarbomol with Aspirin Synalgos  ASA tablets/Enseals Disalcid Micrainin Tagament  Ascriptin Doan's Midol Talwin  Ascriptin A/D Dolene  Mobidin Tanderil  Ascriptin Extra Strength Dolobid Moblgesic Ticlid  Ascriptin with Codeine Doloprin or Doloprin with Codeine Momentum Tolectin  Asperbuf Duoprin Mono-gesic Trendar  Aspergum Duradyne Motrin or Motrin IB Triminicin  Aspirin plain, buffered or enteric coated Durasal Myochrisine Trigesic  Aspirin Suppositories Easprin Nalfon Trillsate  Aspirin with Codeine Ecotrin Regular or Extra Strength Naprosyn Uracel  Atromid-S Efficin Naproxen Ursinus  Auranofin Capsules Elmiron Neocylate Vanquish  Axotal Emagrin Norgesic Verin  Azathioprine Empirin or Empirin with Codeine Normiflo Vitamin E  Azolid Emprazil Nuprin Voltaren  [employer]  Aspirin plain, buffered or children's or timed BC Tablets or powders Encaprin Orgaran Warfarin Sodium  Buff-a-Comp Enoxaparin Orudis Zorpin  Buff-a-Comp with Codeine Equegesic Os-Cal-Gesic   Buffaprin Excedrin plain, buffered or Extra Strength Oxalid   Bufferin Arthritis Strength Feldene Oxphenbutazone   Bufferin plain or Extra Strength Feldene Capsules Oxycodone with Aspirin   Bufferin with Codeine Fenoprofen Fenoprofen Pabalate or Pabalate-SF   Buffets II Flogesic Panagesic   Buffinol plain or Extra Strength Florinal or Florinal with Codeine Panwarfarin   Buf-Tabs Flurbiprofen Penicillamine   Butalbital Compound Four-way cold tablets Penicillin   Butazolidin Fragmin Pepto-Bismol   Carbenicillin Geminisyn Percodan   Carna Arthritis Reliever Geopen Persantine   Carprofen Gold's salt Persistin   Chloramphenicol Goody's Phenylbutazone   Chloromycetin Haltrain Piroxlcam   Clmetidine heparin Plaquenil   Cllnoril Hyco-pap Ponstel   Clofibrate Hydroxy chloroquine Propoxyphen         Before stopping any of these medications, be sure to consult the physician who ordered them.  Some, such as Coumadin (Warfarin) are ordered to prevent or treat serious conditions such as "deep thrombosis", "pumonary embolisms", and other heart problems.  The amount of time that  you may need off of the medication may also vary with the medication and the reason for which you were taking it.  If you are taking any of these medications, please make sure you notify your pain physician before you undergo any procedures.  Prescription given for hydrocodone and valium

## 2014-10-24 ENCOUNTER — Telehealth: Payer: Self-pay

## 2014-10-24 NOTE — Telephone Encounter (Signed)
Patient states she is doing great.  

## 2014-11-05 ENCOUNTER — Encounter: Payer: Self-pay | Admitting: Pain Medicine

## 2014-11-05 ENCOUNTER — Ambulatory Visit: Payer: Medicare Other | Attending: Pain Medicine | Admitting: Pain Medicine

## 2014-11-05 VITALS — BP 134/72 | HR 97 | Temp 97.9°F | Resp 16 | Ht 68.0 in | Wt 170.0 lb

## 2014-11-05 DIAGNOSIS — M7550 Bursitis of unspecified shoulder: Secondary | ICD-10-CM | POA: Insufficient documentation

## 2014-11-05 DIAGNOSIS — M5416 Radiculopathy, lumbar region: Secondary | ICD-10-CM | POA: Diagnosis not present

## 2014-11-05 DIAGNOSIS — M791 Myalgia: Secondary | ICD-10-CM | POA: Diagnosis not present

## 2014-11-05 DIAGNOSIS — M5136 Other intervertebral disc degeneration, lumbar region: Secondary | ICD-10-CM

## 2014-11-05 DIAGNOSIS — M19012 Primary osteoarthritis, left shoulder: Secondary | ICD-10-CM

## 2014-11-05 DIAGNOSIS — M150 Primary generalized (osteo)arthritis: Secondary | ICD-10-CM | POA: Diagnosis not present

## 2014-11-05 DIAGNOSIS — M542 Cervicalgia: Secondary | ICD-10-CM | POA: Diagnosis present

## 2014-11-05 DIAGNOSIS — M19019 Primary osteoarthritis, unspecified shoulder: Secondary | ICD-10-CM | POA: Insufficient documentation

## 2014-11-05 DIAGNOSIS — M797 Fibromyalgia: Secondary | ICD-10-CM | POA: Diagnosis not present

## 2014-11-05 DIAGNOSIS — M47817 Spondylosis without myelopathy or radiculopathy, lumbosacral region: Secondary | ICD-10-CM | POA: Diagnosis not present

## 2014-11-05 DIAGNOSIS — M533 Sacrococcygeal disorders, not elsewhere classified: Secondary | ICD-10-CM

## 2014-11-05 DIAGNOSIS — M47816 Spondylosis without myelopathy or radiculopathy, lumbar region: Secondary | ICD-10-CM

## 2014-11-05 DIAGNOSIS — M5030 Other cervical disc degeneration, unspecified cervical region: Secondary | ICD-10-CM

## 2014-11-05 DIAGNOSIS — M461 Sacroiliitis, not elsewhere classified: Secondary | ICD-10-CM | POA: Diagnosis not present

## 2014-11-05 DIAGNOSIS — M069 Rheumatoid arthritis, unspecified: Secondary | ICD-10-CM | POA: Insufficient documentation

## 2014-11-05 DIAGNOSIS — M5481 Occipital neuralgia: Secondary | ICD-10-CM

## 2014-11-05 DIAGNOSIS — M51369 Other intervertebral disc degeneration, lumbar region without mention of lumbar back pain or lower extremity pain: Secondary | ICD-10-CM

## 2014-11-05 DIAGNOSIS — M19011 Primary osteoarthritis, right shoulder: Secondary | ICD-10-CM

## 2014-11-05 DIAGNOSIS — M1990 Unspecified osteoarthritis, unspecified site: Secondary | ICD-10-CM

## 2014-11-05 DIAGNOSIS — G588 Other specified mononeuropathies: Secondary | ICD-10-CM

## 2014-11-05 MED ORDER — DIAZEPAM 5 MG PO TABS: ORAL_TABLET | ORAL | Status: DC

## 2014-11-05 MED ORDER — HYDROCODONE-ACETAMINOPHEN 5-325 MG PO TABS: ORAL_TABLET | ORAL | Status: DC

## 2014-11-05 NOTE — Progress Notes (Signed)
   Subjective:    Patient ID: Carrie Myers, female    DOB: [birth year], 69 y.o.   MRN: [id number]  HPI  Patient is 69 year old female returns to pain management for further evaluation and treatment of pain involving the neck and shoulders entire back upper and lower extremity regions. Patient has had significant improvement of her pain following [facility] which time patient underwent block of nerves to the sacroiliac joint. Patient states that she has pain involving the region of the shoulder upper back and neck at this time. We discussed patient's condition and will consider patient for interventional treatment at time return appointment consisting of shoulder injection. We will continue medications as prescribed and remain available to consider modification of treatment pending response to treatment and follow-up evaluation. The patient was with understanding and agreed suggested treatment plan.   Review of Systems     Objective:   Physical Exam  There was tenderness of the splenius capitis and occipitalis region of moderate degree. Palpation of the acromioclavicular and glenohumeral joint region reproduced moderate discomfort especially on the right. Patient was with minimal difficulty performing the drop test. There was question decreased grip strength on the right compared to left. Palpation of the acromioclavicular and glenohumeral joint regions reproduced pain on the right greater than the left as mentioned and with mild difficulty performing the drop test as mentioned there was muscle spasms noted in the paraspinal muscular region of the thoracic region without crepitus of the thoracic region noted. Palpation over the lumbar paraspinal muscles region lumbar facet region was with minimal discomfort. Lateral bending and rotation and extension to palpation of the lumbar facets reproduce mild discomfort. There was mild tennis to palpation of the  PSIS and PII S regions. There was mild tends to palpation of the greater trochanteric region and iliotibial band region. Palpation of the gluteal and piriformis musketry reproduced mild discomfort. Straight leg raising tolerates approximately 30 without an increase of pain with dorsiflexion noted.there was negative clonus and negative Homans Abdomen was soft nontender and no costovertebral angle tenderness was noted.      Assessment & Plan:    Degenerative disc disease lumbar spine  Lumbar facet syndrome  Degenerative joint disease of shoulder  Bursitis of shoulder  Sciatica (history of)  Fibromyalgia  Osteoarthritis  Rheumatoid arthritis    Plan   Continue present medications hydrocodone acetaminophen and Valium  Shoulder injection to be performed at time of return appointment as discussed with patient on today's visit  F/U PCP   Dr. Freda Munro  for evaliation of  BP and general medical  condition  F/U surgical evaluation The patient prefers to avoid at this time  F/U neurological evaluation . May be considered pending further evaluation  May consider radiofrequency rhizolysis or intraspinal procedures pending response to present treatment and F/U evaluation   [facility] to call [facility] should patient have concerns prior to scheduled return appointmen.

## 2014-11-05 NOTE — Patient Instructions (Addendum)
Continue present medications hydrocodone acetaminophen and diazepam  Shoulder injection to be performed at time return appointment as discussed  F/U PCP Dr.Berglund  for evaliation of  BP and general medical  condition  F/U surgical evaluation  F/U neurological evaluation  May consider radiofrequency rhizolysis or intraspinal procedures pending response to present treatment and F/U evaluation   Patient to call [facility] should patient have concerns prior to scheduled return appointmen. GENERAL RISKS AND COMPLICATIONS  What are the risk, side effects and possible complications? Generally speaking, most procedures are safe.  However, with any procedure there are risks, side effects, and the possibility of complications.  The risks and complications are dependent upon the sites that are lesioned, or the type of nerve block to be performed.  The closer the procedure is to the spine, the more serious the risks are.  Great care is taken when placing the radio frequency needles, block needles or lesioning probes, but sometimes complications can occur. 1. Infection: Any time there is an injection through the skin, there is a risk of infection.  This is why sterile conditions are used for these blocks.  There are four possible types of infection. 1. Localized skin infection. 2. Central Nervous System Infection-This can be in the form of Meningitis, which can be deadly. 3. Epidural Infections-This can be in the form of an epidural abscess, which can cause pressure inside of the spine, causing compression of the spinal cord with subsequent paralysis. This would require an emergency surgery to decompress, and there are no guarantees that the patient would recover from the paralysis. 4. Discitis-This is an infection of the intervertebral discs.  It occurs in about 1% of discography procedures.  It is difficult to treat and it may lead to surgery.        2. Pain: the needles have to go through  skin and soft tissues, will cause soreness.       3. Damage to internal structures:  The nerves to be lesioned may be near blood vessels or    other nerves which can be potentially damaged.       4. Bleeding: Bleeding is more common if the patient is taking blood thinners such as  aspirin, Coumadin, Ticiid, Plavix, etc., or if he/she have some genetic predisposition  such as hemophilia. Bleeding into the spinal canal can cause compression of the spinal  cord with subsequent paralysis.  This would require an emergency surgery to  decompress and there are no guarantees that the patient would recover from the  paralysis.       5. Pneumothorax:  Puncturing of a lung is a possibility, every time a needle is introduced in  the area of the chest or upper back.  Pneumothorax refers to free air around the  collapsed lung(s), inside of the thoracic cavity (chest cavity).  Another two possible  complications related to a similar event would include: Hemothorax and Chylothorax.   These are variations of the Pneumothorax, where instead of air around the collapsed  lung(s), you may have blood or chyle, respectively.       6. Spinal headaches: They may occur with any procedures in the area of the spine.       7. Persistent CSF (Cerebro-Spinal Fluid) leakage: This is a rare problem, but may occur  with prolonged intrathecal or epidural catheters either due to the formation of a fistulous  track or a dural tear.       8. Nerve damage: By  working so close to the spinal cord, there is always a possibility of  nerve damage, which could be as serious as a permanent spinal cord injury with  paralysis.       9. Death:  Although rare, severe deadly allergic reactions known as "Anaphylactic  reaction" can occur to any of the medications used.      10. Worsening of the symptoms:  We can always make thing worse.  What are the chances of something like this happening? Chances of any of this occuring are extremely low.  By  statistics, you have more of a chance of getting killed in a motor vehicle accident: while driving to the hospital than any of the above occurring .  Nevertheless, you should be aware that they are possibilities.  In general, it is similar to taking a shower.  Everybody knows that you can slip, hit your head and get killed.  Does that mean that you should not shower again?  Nevertheless always keep in mind that statistics do not mean anything if you happen to be on the wrong side of them.  Even if a procedure has a 1 (one) in a 1,000,000 (million) chance of going wrong, it you happen to be that one..Also, keep in mind that by statistics, you have more of a chance of having something go wrong when taking medications.  Who should not have this procedure? If you are on a blood thinning medication (e.g. Coumadin, Plavix, see list of "Blood Thinners"), or if you have an active infection going on, you should not have the procedure.  If you are taking any blood thinners, please inform your physician.  How should I prepare for this procedure?  Do not eat or drink anything at least six hours prior to the procedure.  Bring a driver with you .  It cannot be a taxi.  Come accompanied by an adult that can drive you back, and that is strong enough to help you if your legs get weak or numb from the local anesthetic.  Take all of your medicines the morning of the procedure with just enough water to swallow them.  If you have diabetes, make sure that you are scheduled to have your procedure done first thing in the morning, whenever possible.  If you have diabetes, take only half of your insulin dose and notify our nurse that you have done so as soon as you arrive at the clinic.  If you are diabetic, but only take blood sugar pills (oral hypoglycemic), then do not take them on the morning of your procedure.  You may take them after you have had the procedure.  Do not take aspirin or any aspirin-containing  medications, at least eleven (11) days prior to the procedure.  They may prolong bleeding.  Wear loose fitting clothing that may be easy to take off and that you would not mind if it got stained with Betadine or blood.  Do not wear any jewelry or perfume  Remove any nail coloring.  It will interfere with some of our monitoring equipment.  NOTE: Remember that this is not meant to be interpreted as a complete list of all possible complications.  Unforeseen problems may occur.  BLOOD THINNERS The following drugs contain aspirin or other products, which can cause increased bleeding during surgery and should not be taken for 2 weeks prior to and 1 week after surgery.  If you should need take something for relief of minor pain, you may take  acetaminophen which is found in Tylenol,m Datril, Anacin-3 and Panadol. It is not blood thinner. The products listed below are.  Do not take any of the products listed below in addition to any listed on your instruction sheet.  A.P.C or A.P.C with Codeine Codeine Phosphate Capsules #3 Ibuprofen Ridaura  ABC compound Congesprin Imuran rimadil  Advil Cope Indocin Robaxisal  Alka-Seltzer Effervescent Pain Reliever and Antacid Coricidin or Coricidin-D  Indomethacin Rufen  Alka-Seltzer plus Cold Medicine Cosprin Ketoprofen S-A-C Tablets  Anacin Analgesic Tablets or Capsules Coumadin Korlgesic Salflex  Anacin Extra Strength Analgesic tablets or capsules CP-2 Tablets Lanoril Salicylate  Anaprox Cuprimine Capsules Levenox Salocol  Anexsia-D Dalteparin Magan Salsalate  Anodynos Darvon compound Magnesium Salicylate Sine-off  Ansaid Dasin Capsules Magsal Sodium Salicylate  Anturane Depen Capsules Marnal Soma  APF Arthritis pain formula Dewitt's Pills Measurin Stanback  Argesic Dia-Gesic Meclofenamic Sulfinpyrazone  Arthritis Bayer Timed Release Aspirin Diclofenac Meclomen Sulindac  Arthritis pain formula Anacin Dicumarol Medipren Supac  Analgesic (Safety coated)  Arthralgen Diffunasal Mefanamic Suprofen  Arthritis Strength Bufferin Dihydrocodeine Mepro Compound Suprol  Arthropan liquid Dopirydamole Methcarbomol with Aspirin Synalgos  ASA tablets/Enseals Disalcid Micrainin Tagament  Ascriptin Doan's Midol Talwin  Ascriptin A/D Dolene Mobidin Tanderil  Ascriptin Extra Strength Dolobid Moblgesic Ticlid  Ascriptin with Codeine Doloprin or Doloprin with Codeine Momentum Tolectin  Asperbuf Duoprin Mono-gesic Trendar  Aspergum Duradyne Motrin or Motrin IB Triminicin  Aspirin plain, buffered or enteric coated Durasal Myochrisine Trigesic  Aspirin Suppositories Easprin Nalfon Trillsate  Aspirin with Codeine Ecotrin Regular or Extra Strength Naprosyn Uracel  Atromid-S Efficin Naproxen Ursinus  Auranofin Capsules Elmiron Neocylate Vanquish  Axotal Emagrin Norgesic Verin  Azathioprine Empirin or Empirin with Codeine Normiflo Vitamin E  Azolid Emprazil Nuprin Voltaren  Bayer Aspirin plain, buffered or children's or timed BC Tablets or powders Encaprin Orgaran Warfarin Sodium  Buff-a-Comp Enoxaparin Orudis Zorpin  Buff-a-Comp with Codeine Equegesic Os-Cal-Gesic   Buffaprin Excedrin plain, buffered or Extra Strength Oxalid   Bufferin Arthritis Strength Feldene Oxphenbutazone   Bufferin plain or Extra Strength Feldene Capsules Oxycodone with Aspirin   Bufferin with Codeine Fenoprofen Fenoprofen Pabalate or Pabalate-SF   Buffets II Flogesic Panagesic   Buffinol plain or Extra Strength Florinal or Florinal with Codeine Panwarfarin   Buf-Tabs Flurbiprofen Penicillamine   Butalbital Compound Four-way cold tablets Penicillin   Butazolidin Fragmin Pepto-Bismol   Carbenicillin Geminisyn Percodan   Carna Arthritis Reliever Geopen Persantine   Carprofen Gold's salt Persistin   Chloramphenicol Goody's Phenylbutazone   Chloromycetin Haltrain Piroxlcam   Clmetidine heparin Plaquenil   Cllnoril Hyco-pap Ponstel   Clofibrate Hydroxy chloroquine Propoxyphen          Before stopping any of these medications, be sure to consult the physician who ordered them.  Some, such as Coumadin (Warfarin) are ordered to prevent or treat serious conditions such as "deep thrombosis", "pumonary embolisms", and other heart problems.  The amount of time that you may need off of the medication may also vary with the medication and the reason for which you were taking it.  If you are taking any of these medications, please make sure you notify your pain physician before you undergo any procedures.         Trigger Point Injection Trigger points are areas where you have muscle pain. A trigger point injection is a shot given in the trigger point to relieve that pain. A trigger point might feel like a knot in your muscle. It hurts to press on a trigger point. Sometimes  the pain spreads out (radiates) to other parts of the body. For example, pressing on a trigger point in your shoulder might cause pain in your arm or neck. You might have one trigger point. Or, you might have more than one. People often have trigger points in their upper back and lower back. They also occur often in the neck and shoulders. Pain from a trigger point lasts for a long time. It can make it hard to keep moving. You might not be able to do the exercise or physical therapy that could help you deal with the pain. A trigger point injection may help. It does not work for everyone. But, it may relieve your pain for a few days or a few months. A trigger point injection does not cure long-lasting (chronic) pain. LET YOUR CAREGIVER KNOW ABOUT: 2. Any allergies (especially to latex, lidocaine, or steroids). 3. Blood-thinning medicines that you take. These drugs can lead to bleeding or bruising after an injection. They include: 1. Aspirin. 2. Ibuprofen. 3. Clopidogrel. 4. Warfarin. 4. Other medicines you take. This includes all vitamins, herbs, eyedrops, over-the-counter medicines, and creams. 5. Use of  steroids. 6. Recent infections. 7. Past problems with numbing medicines. 8. Bleeding problems. 9. Surgeries you have had. 10. Other health problems. RISKS AND COMPLICATIONS A trigger point injection is a safe treatment. However, problems may develop, such as:  Minor side effects usually go away in 1 to 2 days. These may include:  Soreness.  Bruising.  Stiffness.  More serious problems are rare. But, they may include:  Bleeding under the skin (hematoma).  Skin infection.  Breaking off of the needle under your skin.  Lung puncture.  The trigger point injection may not work for you. BEFORE THE PROCEDURE You may need to stop taking any medicine that thins your blood. This is to prevent bleeding and bruising. Usually these medicines are stopped several days before the injection. No other preparation is needed. PROCEDURE  A trigger point injection can be given in your caregiver's office or in a clinic. Each injection takes 2 minutes or less.  Your caregiver will feel for trigger points. The caregiver may use a marker to circle the area for the injection.  The skin over the trigger point will be washed with a germ-killing (antiseptic) solution.  The caregiver pinches the spot for the injection.  Then, a very thin needle is used for the shot. You may feel pain or a twitching feeling when the needle enters the trigger point.  A numbing solution may be injected into the trigger point. Sometimes a drug to keep down swelling, redness, and warmth (inflammation) is also injected.  Your caregiver moves the needle around the trigger zone until the tightness and twitching goes away.  After the injection, your caregiver may put gentle pressure over the injection site.  Then it is covered with a bandage. AFTER THE PROCEDURE  You can go right home after the injection.  The bandage can be taken off after a few hours.  You may feel sore and stiff for 1 to 2 days.  Go back to your  regular activities slowly. Your caregiver may ask you to stretch your muscles. Do not do anything that takes extra energy for a few days.  Follow your caregiver's instructions to manage and treat other pain. Document Released: 02/18/2011 Document Revised: 06/26/2012 Document Reviewed: 02/18/2011 [facility] Patient Information 2015 [place], [place]. This information is not intended to replace advice given to you by your health care  provider. Make sure you discuss any questions you have with your health care provider.

## 2014-11-05 NOTE — Progress Notes (Signed)
Safety precautions to be maintained throughout the outpatient stay will include: orient to surroundings, keep bed in low position, maintain call bell within reach at all times, provide assistance with transfer out of bed and ambulation.  

## 2014-11-13 ENCOUNTER — Encounter: Payer: Self-pay | Admitting: Pain Medicine

## 2014-11-13 ENCOUNTER — Ambulatory Visit: Payer: Medicare Other | Attending: Pain Medicine | Admitting: Pain Medicine

## 2014-11-13 VITALS — BP 119/69 | HR 79 | Temp 98.0°F | Resp 14 | Ht 68.0 in | Wt 170.0 lb

## 2014-11-13 DIAGNOSIS — M19011 Primary osteoarthritis, right shoulder: Secondary | ICD-10-CM

## 2014-11-13 DIAGNOSIS — G588 Other specified mononeuropathies: Secondary | ICD-10-CM

## 2014-11-13 DIAGNOSIS — M19019 Primary osteoarthritis, unspecified shoulder: Secondary | ICD-10-CM | POA: Diagnosis not present

## 2014-11-13 DIAGNOSIS — M19012 Primary osteoarthritis, left shoulder: Secondary | ICD-10-CM

## 2014-11-13 DIAGNOSIS — M25512 Pain in left shoulder: Secondary | ICD-10-CM | POA: Diagnosis present

## 2014-11-13 DIAGNOSIS — M797 Fibromyalgia: Secondary | ICD-10-CM

## 2014-11-13 DIAGNOSIS — M47816 Spondylosis without myelopathy or radiculopathy, lumbar region: Secondary | ICD-10-CM

## 2014-11-13 DIAGNOSIS — M25511 Pain in right shoulder: Secondary | ICD-10-CM | POA: Diagnosis present

## 2014-11-13 DIAGNOSIS — M533 Sacrococcygeal disorders, not elsewhere classified: Secondary | ICD-10-CM

## 2014-11-13 DIAGNOSIS — M1990 Unspecified osteoarthritis, unspecified site: Secondary | ICD-10-CM

## 2014-11-13 DIAGNOSIS — M5136 Other intervertebral disc degeneration, lumbar region: Secondary | ICD-10-CM

## 2014-11-13 MED ORDER — METHYLPREDNISOLONE ACETATE 40 MG/ML IJ SUSP
INTRAMUSCULAR | Status: AC
  Administered 2014-11-13: 10 mg
  Filled 2014-11-13: qty 1

## 2014-11-13 MED ORDER — DOXYCYCLINE HYCLATE 100 MG PO TABS: 100.0000 mg | ORAL_TABLET | Freq: Two times a day (BID) | ORAL | Status: DC

## 2014-11-13 MED ORDER — TRIAMCINOLONE ACETONIDE 40 MG/ML IJ SUSP
INTRAMUSCULAR | Status: AC
  Filled 2014-11-13: qty 1

## 2014-11-13 MED ORDER — SODIUM CHLORIDE 0.9 % IJ SOLN
INTRAMUSCULAR | Status: AC
  Filled 2014-11-13: qty 20

## 2014-11-13 MED ORDER — BUPIVACAINE HCL (PF) 0.25 % IJ SOLN
INTRAMUSCULAR | Status: AC
  Administered 2014-11-13: 10 mL
  Filled 2014-11-13: qty 30

## 2014-11-13 MED ORDER — ORPHENADRINE CITRATE 30 MG/ML IJ SOLN
INTRAMUSCULAR | Status: AC
  Filled 2014-11-13: qty 2

## 2014-11-13 NOTE — Patient Instructions (Addendum)
PLAN   Continue present medications hydrocodone acetaminophen and diazepam Please obtain your doxycycline antibiotic and began taking the doxycycline antibiotic today as prescribed  F/U PCP Dr.Berglund  for evaliation of  BP and general medical  condition  F/U surgical evaluation. May consider pending response to treatment and follow-up evaluation  F/U neurological evaluation. May consider [facility] EMG studies  May consider radiofrequency rhizolysis or intraspinal procedures pending response to present treatment and F/U evaluation   Patient to call [facility] should patient have concerns prior to scheduled return appointment. GENERAL RISKS AND COMPLICATIONS  What are the risk, side effects and possible complications? Generally speaking, most procedures are safe.  However, with any procedure there are risks, side effects, and the possibility of complications.  The risks and complications are dependent upon the sites that are lesioned, or the type of nerve block to be performed.  The closer the procedure is to the spine, the more serious the risks are.  Great care is taken when placing the radio frequency needles, block needles or lesioning probes, but sometimes complications can occur. 1. Infection: Any time there is an injection through the skin, there is a risk of infection.  This is why sterile conditions are used for these blocks.  There are four possible types of infection. 1. Localized skin infection. 2. Central Nervous System Infection-This can be in the form of Meningitis, which can be deadly. 3. Epidural Infections-This can be in the form of an epidural abscess, which can cause pressure inside of the spine, causing compression of the spinal cord with subsequent paralysis. This would require an emergency surgery to decompress, and there are no guarantees that the patient would recover from the paralysis. 4. Discitis-This is an infection of the intervertebral discs.  It occurs in  about 1% of discography procedures.  It is difficult to treat and it may lead to surgery.        2. Pain: the needles have to go through skin and soft tissues, will cause soreness.       3. Damage to internal structures:  The nerves to be lesioned may be near blood vessels or    other nerves which can be potentially damaged.       4. Bleeding: Bleeding is more common if the patient is taking blood thinners such as  aspirin, Coumadin, Ticiid, Plavix, etc., or if he/she have some genetic predisposition  such as hemophilia. Bleeding into the spinal canal can cause compression of the spinal  cord with subsequent paralysis.  This would require an emergency surgery to  decompress and there are no guarantees that the patient would recover from the  paralysis.       5. Pneumothorax:  Puncturing of a lung is a possibility, every time a needle is introduced in  the area of the chest or upper back.  Pneumothorax refers to free air around the  collapsed lung(s), inside of the thoracic cavity (chest cavity).  Another two possible  complications related to a similar event would include: Hemothorax and Chylothorax.   These are variations of the Pneumothorax, where instead of air around the collapsed  lung(s), you may have blood or chyle, respectively.       6. Spinal headaches: They may occur with any procedures in the area of the spine.       7. Persistent CSF (Cerebro-Spinal Fluid) leakage: This is a rare problem, but may occur  with prolonged intrathecal or epidural catheters either due to the formation  of a fistulous  track or a dural tear.       8. Nerve damage: By working so close to the spinal cord, there is always a possibility of  nerve damage, which could be as serious as a permanent spinal cord injury with  paralysis.       9. Death:  Although rare, severe deadly allergic reactions known as "Anaphylactic  reaction" can occur to any of the medications used.      10. Worsening of the symptoms:  We can always  make thing worse.  What are the chances of something like this happening? Chances of any of this occuring are extremely low.  By statistics, you have more of a chance of getting killed in a motor vehicle accident: while driving to the hospital than any of the above occurring .  Nevertheless, you should be aware that they are possibilities.  In general, it is similar to taking a shower.  Everybody knows that you can slip, hit your head and get killed.  Does that mean that you should not shower again?  Nevertheless always keep in mind that statistics do not mean anything if you happen to be on the wrong side of them.  Even if a procedure has a 1 (one) in a 1,000,000 (million) chance of going wrong, it you happen to be that one..Also, keep in mind that by statistics, you have more of a chance of having something go wrong when taking medications.  Who should not have this procedure? If you are on a blood thinning medication (e.g. Coumadin, Plavix, see list of "Blood Thinners"), or if you have an active infection going on, you should not have the procedure.  If you are taking any blood thinners, please inform your physician.  How should I prepare for this procedure?  Do not eat or drink anything at least six hours prior to the procedure.  Bring a driver with you .  It cannot be a taxi.  Come accompanied by an adult that can drive you back, and that is strong enough to help you if your legs get weak or numb from the local anesthetic.  Take all of your medicines the morning of the procedure with just enough water to swallow them.  If you have diabetes, make sure that you are scheduled to have your procedure done first thing in the morning, whenever possible.  If you have diabetes, take only half of your insulin dose and notify our nurse that you have done so as soon as you arrive at the clinic.  If you are diabetic, but only take blood sugar pills (oral hypoglycemic), then do not take them on the  morning of your procedure.  You may take them after you have had the procedure.  Do not take aspirin or any aspirin-containing medications, at least eleven (11) days prior to the procedure.  They may prolong bleeding.  Wear loose fitting clothing that may be easy to take off and that you would not mind if it got stained with Betadine or blood.  Do not wear any jewelry or perfume  Remove any nail coloring.  It will interfere with some of our monitoring equipment.  NOTE: Remember that this is not meant to be interpreted as a complete list of all possible complications.  Unforeseen problems may occur.  BLOOD THINNERS The following drugs contain aspirin or other products, which can cause increased bleeding during surgery and should not be taken for 2 weeks prior to and  1 week after surgery.  If you should need take something for relief of minor pain, you may take acetaminophen which is found in Tylenol,m Datril, Anacin-3 and Panadol. It is not blood thinner. The products listed below are.  Do not take any of the products listed below in addition to any listed on your instruction sheet.  A.P.C or A.P.C with Codeine Codeine Phosphate Capsules #3 Ibuprofen Ridaura  ABC compound Congesprin Imuran rimadil  Advil Cope Indocin Robaxisal  Alka-Seltzer Effervescent Pain Reliever and Antacid Coricidin or Coricidin-D  Indomethacin Rufen  Alka-Seltzer plus Cold Medicine Cosprin Ketoprofen S-A-C Tablets  Anacin Analgesic Tablets or Capsules Coumadin Korlgesic Salflex  Anacin Extra Strength Analgesic tablets or capsules CP-2 Tablets Lanoril Salicylate  Anaprox Cuprimine Capsules Levenox Salocol  Anexsia-D Dalteparin Magan Salsalate  Anodynos Darvon compound Magnesium Salicylate Sine-off  Ansaid Dasin Capsules Magsal Sodium Salicylate  Anturane Depen Capsules Marnal Soma  APF Arthritis pain formula Dewitt's Pills Measurin Stanback  Argesic Dia-Gesic Meclofenamic Sulfinpyrazone  Arthritis Bayer Timed  Release Aspirin Diclofenac Meclomen Sulindac  Arthritis pain formula Anacin Dicumarol Medipren Supac  Analgesic (Safety coated) Arthralgen Diffunasal Mefanamic Suprofen  Arthritis Strength Bufferin Dihydrocodeine Mepro Compound Suprol  Arthropan liquid Dopirydamole Methcarbomol with Aspirin Synalgos  ASA tablets/Enseals Disalcid Micrainin Tagament  Ascriptin Doan's Midol Talwin  Ascriptin A/D Dolene Mobidin Tanderil  Ascriptin Extra Strength Dolobid Moblgesic Ticlid  Ascriptin with Codeine Doloprin or Doloprin with Codeine Momentum Tolectin  Asperbuf Duoprin Mono-gesic Trendar  Aspergum Duradyne Motrin or Motrin IB Triminicin  Aspirin plain, buffered or enteric coated Durasal Myochrisine Trigesic  Aspirin Suppositories Easprin Nalfon Trillsate  Aspirin with Codeine Ecotrin Regular or Extra Strength Naprosyn Uracel  Atromid-S Efficin Naproxen Ursinus  Auranofin Capsules Elmiron Neocylate Vanquish  Axotal Emagrin Norgesic Verin  Azathioprine Empirin or Empirin with Codeine Normiflo Vitamin E  Azolid Emprazil Nuprin Voltaren  Bayer Aspirin plain, buffered or children's or timed BC Tablets or powders Encaprin Orgaran Warfarin Sodium  Buff-a-Comp Enoxaparin Orudis Zorpin  Buff-a-Comp with Codeine Equegesic Os-Cal-Gesic   Buffaprin Excedrin plain, buffered or Extra Strength Oxalid   Bufferin Arthritis Strength Feldene Oxphenbutazone   Bufferin plain or Extra Strength Feldene Capsules Oxycodone with Aspirin   Bufferin with Codeine Fenoprofen Fenoprofen Pabalate or Pabalate-SF   Buffets II Flogesic Panagesic   Buffinol plain or Extra Strength Florinal or Florinal with Codeine Panwarfarin   Buf-Tabs Flurbiprofen Penicillamine   Butalbital Compound Four-way cold tablets Penicillin   Butazolidin Fragmin Pepto-Bismol   Carbenicillin Geminisyn Percodan   Carna Arthritis Reliever Geopen Persantine   Carprofen Gold's salt Persistin   Chloramphenicol Goody's Phenylbutazone   Chloromycetin  [place] Piroxlcam   Clmetidine heparin Plaquenil   Cllnoril Hyco-pap Ponstel   Clofibrate Hydroxy chloroquine Propoxyphen         Before stopping any of these medications, be sure to consult the physician who ordered them.  Some, such as Coumadin (Warfarin) are ordered to prevent or treat serious conditions such as "deep thrombosis", "pumonary embolisms

## 2014-11-13 NOTE — Progress Notes (Signed)
Safety precautions to be maintained throughout the outpatient stay will include: orient to surroundings, keep bed in low position, maintain call bell within reach at all times, provide assistance with transfer out of bed and ambulation.  

## 2014-11-13 NOTE — Progress Notes (Signed)
   Subjective:    Patient ID: Carrie Myers, female    DOB: February 25, 1946, 69 y.o.   MRN: [id number]  HPI                                                        SHOULDER INJECTION   The patient is a 69 year old female who returns to [facility] for further evaluation and treatment of pain involving the region of the shoulder. The patient is with severe pain involving the region of the right shoulder. The patient states the pain is aggravated by reaching lifting pushing pulling maneuvers. The patient is without any definite trauma to contributing to patient's symptomatology. The patient is with known degenerative joint disease of the shoulder. We will proceed with interventional treatment at this time an attempt to decrease severity of patient's symptoms, minimize progression of symptoms, and avoid the need for more involved treatment. The patient was understanding and agreement with suggested treatment plan.   DESCRIPTION OF PROCEDURE:  Right Shoulder Injection  Patient was in sitting position in the procedure room. EKG, blood pressure, pulse, and pulse oximetry monitoring all in place. Betadine prep of proposed entry site was accomplished. Following Betadine prep of proposed entry site and identification of landmarks for shoulder injection procedure was performed  Right shoulder injection anterior approach. Following identification of landmarks for right shoulder injection 22-gauge needle was inserted in the region of the right shoulder anteriorly and a total of 2 cc of 0.25% bupivacaine with Depo-Medrol was injected for right shoulder injection anterior approach  Right shoulder injection posterior approach. Following identification of landmarks for right shoulder injection 22-gauge needle was inserted in the region of the right shoulder posteriorly and a total of 2 cc of 0.25% bupivacaine with Depo-Medrol was injected for right shoulder injection posterior approach  Myoneural  block injections of the deltoid musculature region Following Betadine prep proposed entry site 22-gauge needle was inserted in the region of the deltoid musculature region and following negative aspiration 2 cc of quarter percent bupivacaine with Depo-Medrol was injected for myoneural block injection of the deltoid musculature region. Needle was removed   The patient tolerated procedure well A total of 40 mg of Depo-Medrol was utilized for the procedure   PLAN   Continue present medications hydrocodone acetaminophen and diazepam  F/U PCP  Dr.Berglund  for evaliation of  BP and general medical  condition  F/U surgical evaluation to be considered  F/U neurological evaluation May be considered pending further evaluation  May consider radiofrequency rhizolysis or intraspinal procedures pending response to present treatment and F/U evaluation   Patient to call [facility] should patient have concerns prior to scheduled return appointment.      Review of Systems     Objective:   Physical Exam        Assessment & Plan:

## 2014-11-14 ENCOUNTER — Telehealth: Payer: Self-pay

## 2014-11-14 NOTE — Telephone Encounter (Signed)
Patient states she is doing fine.   

## 2014-12-03 ENCOUNTER — Ambulatory Visit: Payer: Medicare Other | Attending: Pain Medicine | Admitting: Pain Medicine

## 2014-12-03 ENCOUNTER — Encounter: Payer: Self-pay | Admitting: Pain Medicine

## 2014-12-03 VITALS — BP 128/79 | HR 83 | Temp 98.0°F | Resp 18

## 2014-12-03 DIAGNOSIS — M47816 Spondylosis without myelopathy or radiculopathy, lumbar region: Secondary | ICD-10-CM

## 2014-12-03 DIAGNOSIS — M7060 Trochanteric bursitis, unspecified hip: Secondary | ICD-10-CM | POA: Diagnosis not present

## 2014-12-03 DIAGNOSIS — M797 Fibromyalgia: Secondary | ICD-10-CM | POA: Diagnosis not present

## 2014-12-03 DIAGNOSIS — M19012 Primary osteoarthritis, left shoulder: Secondary | ICD-10-CM

## 2014-12-03 DIAGNOSIS — M533 Sacrococcygeal disorders, not elsewhere classified: Secondary | ICD-10-CM | POA: Diagnosis not present

## 2014-12-03 DIAGNOSIS — M5136 Other intervertebral disc degeneration, lumbar region: Secondary | ICD-10-CM | POA: Insufficient documentation

## 2014-12-03 DIAGNOSIS — M1990 Unspecified osteoarthritis, unspecified site: Secondary | ICD-10-CM

## 2014-12-03 DIAGNOSIS — G588 Other specified mononeuropathies: Secondary | ICD-10-CM

## 2014-12-03 DIAGNOSIS — M19011 Primary osteoarthritis, right shoulder: Secondary | ICD-10-CM

## 2014-12-03 DIAGNOSIS — M5416 Radiculopathy, lumbar region: Secondary | ICD-10-CM | POA: Diagnosis not present

## 2014-12-03 DIAGNOSIS — M542 Cervicalgia: Secondary | ICD-10-CM | POA: Diagnosis present

## 2014-12-03 DIAGNOSIS — M7550 Bursitis of unspecified shoulder: Secondary | ICD-10-CM | POA: Insufficient documentation

## 2014-12-03 DIAGNOSIS — M546 Pain in thoracic spine: Secondary | ICD-10-CM | POA: Diagnosis present

## 2014-12-03 DIAGNOSIS — M5481 Occipital neuralgia: Secondary | ICD-10-CM

## 2014-12-03 DIAGNOSIS — M461 Sacroiliitis, not elsewhere classified: Secondary | ICD-10-CM | POA: Diagnosis not present

## 2014-12-03 DIAGNOSIS — M545 Low back pain: Secondary | ICD-10-CM | POA: Diagnosis present

## 2014-12-03 DIAGNOSIS — M19019 Primary osteoarthritis, unspecified shoulder: Secondary | ICD-10-CM | POA: Insufficient documentation

## 2014-12-03 DIAGNOSIS — M069 Rheumatoid arthritis, unspecified: Secondary | ICD-10-CM | POA: Insufficient documentation

## 2014-12-03 DIAGNOSIS — M47817 Spondylosis without myelopathy or radiculopathy, lumbosacral region: Secondary | ICD-10-CM | POA: Diagnosis not present

## 2014-12-03 DIAGNOSIS — M5030 Other cervical disc degeneration, unspecified cervical region: Secondary | ICD-10-CM

## 2014-12-03 MED ORDER — HYDROCODONE-ACETAMINOPHEN 5-325 MG PO TABS: ORAL_TABLET | ORAL | Status: DC

## 2014-12-03 MED ORDER — DIAZEPAM 5 MG PO TABS: ORAL_TABLET | ORAL | Status: DC

## 2014-12-03 NOTE — Progress Notes (Signed)
Safety precautions to be maintained throughout the outpatient stay will include: orient to surroundings, keep bed in low position, maintain call bell within reach at all times, provide assistance with transfer out of bed and ambulation.  

## 2014-12-03 NOTE — Patient Instructions (Addendum)
PLAN   Continue present medication diazepam and hydrocodone acetaminophen  Block of nerves to sacroiliac joint to be performed at time of return appointment  Ask Carrie Myers date of MRI of the shoulder  F/U PCP Dr.Berglund  for evaliation of  BP and general medical  condition  F/U surgical evaluation. May consider pending follow-up evaluations  F/U neurological evaluation. May consider pending follow-up evaluations  May consider radiofrequency rhizolysis or intraspinal procedures pending response to present treatment and F/U evaluation   Patient to call [facility] should patient have concerns prior to scheduled return appointment. Sacroiliac (SI) Joint Injection Patient Information  Description: The sacroiliac joint connects the scrum (very low back and tailbone) to the ilium (a pelvic bone which also forms half of the hip joint).  Normally this joint experiences very little motion.  When this joint becomes inflamed or unstable low back and or hip and pelvis pain may result.  Injection of this joint with local anesthetics (numbing medicines) and steroids can provide diagnostic information and reduce pain.  This injection is performed with the aid of x-ray guidance into the tailbone area while you are lying on your stomach.   You may experience an electrical sensation down the leg while this is being done.  You may also experience numbness.  We also may ask if we are reproducing your normal pain during the injection.  Conditions which may be treated SI injection:   Low back, buttock, hip or leg pain  Preparation for the Injection:  1. Do not eat any solid food or dairy products within 6 hours of your appointment.  2. You may drink clear liquids up to 2 hours before appointment.  Clear liquids include water, black coffee, juice or soda.  No milk or cream please. 3. You may take your regular medications, including pain medications with a sip of water before your appointment.   Diabetics should hold regular insulin (if take separately) and take 1/2 normal NPH dose the morning of the procedure.  Carry some sugar containing items with you to your appointment. 4. A driver must accompany you and be prepared to drive you home after your procedure. 5. Bring all of your current medications with you. 6. An IV may be inserted and sedation may be given at the discretion of the physician. 7. A blood pressure cuff, EKG and other monitors will often be applied during the procedure.  Some patients may need to have extra oxygen administered for a short period.  8. You will be asked to provide medical information, including your allergies, prior to the procedure.  We must know immediately if you are taking blood thinners (like Coumadin/Warfarin) or if you are allergic to IV iodine contrast (dye).  We must know if you could possible be pregnant.  Possible side effects:   Bleeding from needle site  Infection (rare, may require surgery)  Nerve injury (rare)  Numbness & tingling (temporary)  A brief convulsion or seizure  Light-headedness (temporary)  Pain at injection site (several days)  Decreased blood pressure (temporary)  Weakness in the leg (temporary)   Call if you experience:   New onset weakness or numbness of an extremity below the injection site that last more than 8 hours.  Hives or difficulty breathing ( go to the emergency room)  Inflammation or drainage at the injection site  Any new symptoms which are concerning to you  Please note:  Although the local anesthetic injected can often make your back/ hip/ buttock/ leg  feel good for several hours after the injections, the pain will likely return.  It takes 3-7 days for steroids to work in the sacroiliac area.  You may not notice any pain relief for at least that one week.  If effective, we will often do a series of three injections spaced 3-6 weeks apart to maximally decrease your pain.  After the  initial series, we generally will wait some months before a repeat injection of the same type.  If you have any questions, please call [phone] [facility]

## 2014-12-03 NOTE — Progress Notes (Signed)
   Subjective:    Patient ID: Carrie Myers, female    DOB: 01-23-1946, 69 y.o.   MRN: [id number]  HPI  Patient is 69 year old female who [facility] and treatment of pain involving the neck upper mid and lower back and lower extremity regions. Patient states that the pain involving the upper mid back region is a moderate degree. Patient states most bothersome pain involves the lower back lower extremity regions. Patient has had significant improvement of pain with prior interventional treatment performed in [facility]. Patient return of pain of significant degree at this time. Patient states the pain involving the lower back and lower extremity region and is aggravated by standing and walking. Twisting and turning maneuvers also aggravate patient's pain. Patient has difficulty lying on her side in bed as well. We discussed patient's condition and will continue present medicationsIncluding diazepam and hydrocodone acetaminophen. We will proceed with block of nerves to the sacroiliac joint at time return appointment in attempt to decrease severity of symptoms, minimize progression of symptoms, and avoid the need for more involved treatment. The patient is understanding and agrees with suggested treatment plan      Review of Systems     Objective:   Physical Exam  There was mild tends to palpation of the splenius capitis and occipitalis musculature regions. Palpation of the acromioclavicular and glenohumeral joint regions reproduced mild to moderate discomfort. There was tends to palpation of the right shoulder greater than the left shoulder. Palpation over the region of thecervical facet cervical paraspinal muscular region and the thoracic facet thoracic paraspinal muscles region reproduced mild to moderate discomfort. There was tends to palpation of the thoracic region with no crepitus of the thoracic region noted. Region was with  without significant increase of pain with Tinel and Phalen's maneuver. Palpation over the lumbar paraspinal muscles lumbar facet region was associated with symptoms palpation of moderate degree. There was moderate moderately severe tends to palpation over the PSIS and PII S regions. There was moderate to moderately severe tends to palpation of the gluteal and piriformis musculature region as well as the greater trochanteric region and iliotibial band region. Straight leg raising was limited approximately 30 without an increase of pain with dorsiflexion noted. There was negative clonus and negative Homans. DTRs were difficult to elicit patient had difficulty relaxing. There was negative clonus negative Homans. Abdomen was nontender with no costovertebral angle tenderness noted.      Assessment & Plan:     Degenerative disc disease lumbar spine  Lumbar facet syndrome  Degenerative joint disease of shoulder  Sacroiliac joint dysfunction  Bursitis of shoulder  Sciatica (history of)  Greater trochanteric bursitis  Fibromyalgia  Osteoarthritis  Rheumatoid arthritis     PLAN   Continue present medication diazepam and hydrocodone acetaminophen  Block of nerves to the sacroiliac joint to be performed at time return appointment  F/U PCP  Dr.F Humphrey Rolls  for evaliation of  BP and general medical  condition  F/U surgical evaluation. May consider pending follow-up evaluations  F/U neurological evaluation. May consider pending follow-up evaluations  MRI of shoulder to be considered to be obtained as discussed pending follow-up evaluation  May consider radiofrequency rhizolysis or intraspinal procedures pending response to present treatment and F/U evaluation   Patient to call [facility] should patient have concerns prior to scheduled return appointment.

## 2014-12-16 ENCOUNTER — Encounter: Payer: Self-pay | Admitting: Pain Medicine

## 2014-12-16 ENCOUNTER — Ambulatory Visit: Payer: Medicare Other | Attending: Pain Medicine | Admitting: Pain Medicine

## 2014-12-16 VITALS — BP 156/85 | HR 66 | Temp 98.2°F | Resp 16 | Ht 68.0 in | Wt 173.0 lb

## 2014-12-16 DIAGNOSIS — M47816 Spondylosis without myelopathy or radiculopathy, lumbar region: Secondary | ICD-10-CM

## 2014-12-16 DIAGNOSIS — G588 Other specified mononeuropathies: Secondary | ICD-10-CM

## 2014-12-16 DIAGNOSIS — M79605 Pain in left leg: Secondary | ICD-10-CM | POA: Diagnosis present

## 2014-12-16 DIAGNOSIS — M545 Low back pain: Secondary | ICD-10-CM | POA: Diagnosis present

## 2014-12-16 DIAGNOSIS — M19011 Primary osteoarthritis, right shoulder: Secondary | ICD-10-CM | POA: Diagnosis not present

## 2014-12-16 DIAGNOSIS — M79604 Pain in right leg: Secondary | ICD-10-CM | POA: Diagnosis present

## 2014-12-16 DIAGNOSIS — M5136 Other intervertebral disc degeneration, lumbar region: Secondary | ICD-10-CM

## 2014-12-16 DIAGNOSIS — M533 Sacrococcygeal disorders, not elsewhere classified: Secondary | ICD-10-CM

## 2014-12-16 DIAGNOSIS — M797 Fibromyalgia: Secondary | ICD-10-CM

## 2014-12-16 DIAGNOSIS — M1990 Unspecified osteoarthritis, unspecified site: Secondary | ICD-10-CM

## 2014-12-16 DIAGNOSIS — M5481 Occipital neuralgia: Secondary | ICD-10-CM

## 2014-12-16 DIAGNOSIS — M5030 Other cervical disc degeneration, unspecified cervical region: Secondary | ICD-10-CM

## 2014-12-16 DIAGNOSIS — M19012 Primary osteoarthritis, left shoulder: Secondary | ICD-10-CM

## 2014-12-16 MED ORDER — FENTANYL CITRATE (PF) 100 MCG/2ML IJ SOLN: 100.0000 ug | Freq: Once | INTRAMUSCULAR | Status: DC

## 2014-12-16 MED ORDER — TRIAMCINOLONE ACETONIDE 40 MG/ML IJ SUSP: 40.0000 mg | Freq: Once | INTRAMUSCULAR | Status: DC

## 2014-12-16 MED ORDER — BUPIVACAINE HCL (PF) 0.25 % IJ SOLN
INTRAMUSCULAR | Status: AC
  Administered 2014-12-16: 12:00:00
  Filled 2014-12-16: qty 30

## 2014-12-16 MED ORDER — ORPHENADRINE CITRATE 30 MG/ML IJ SOLN
INTRAMUSCULAR | Status: AC
  Administered 2014-12-16: 60 mg via INTRAMUSCULAR
  Filled 2014-12-16: qty 2

## 2014-12-16 MED ORDER — MIDAZOLAM HCL 5 MG/5ML IJ SOLN: 5.0000 mg | Freq: Once | INTRAMUSCULAR | Status: DC

## 2014-12-16 MED ORDER — TRIAMCINOLONE ACETONIDE 40 MG/ML IJ SUSP
INTRAMUSCULAR | Status: AC
  Administered 2014-12-16: 12:00:00
  Filled 2014-12-16: qty 1

## 2014-12-16 MED ORDER — LACTATED RINGERS IV SOLN: 1000.0000 mL | INTRAVENOUS | Status: DC

## 2014-12-16 MED ORDER — SODIUM CHLORIDE 0.9 % IJ SOLN: 20.0000 mL | Freq: Once | INTRAMUSCULAR | Status: DC

## 2014-12-16 MED ORDER — FENTANYL CITRATE (PF) 100 MCG/2ML IJ SOLN
INTRAMUSCULAR | Status: AC
  Administered 2014-12-16: 100 ug via INTRAVENOUS
  Filled 2014-12-16: qty 2

## 2014-12-16 MED ORDER — MIDAZOLAM HCL 5 MG/5ML IJ SOLN
INTRAMUSCULAR | Status: AC
  Administered 2014-12-16: 4 mg via INTRAVENOUS
  Filled 2014-12-16: qty 5

## 2014-12-16 MED ORDER — LIDOCAINE HCL (PF) 1 % IJ SOLN: 10.0000 mL | Freq: Once | INTRAMUSCULAR | Status: DC

## 2014-12-16 MED ORDER — BUPIVACAINE HCL (PF) 0.25 % IJ SOLN: 30.0000 mL | Freq: Once | INTRAMUSCULAR | Status: DC

## 2014-12-16 MED ORDER — ORPHENADRINE CITRATE 30 MG/ML IJ SOLN
60.0000 mg | Freq: Once | INTRAMUSCULAR | Status: AC
Start: 2014-12-16 — End: 2014-12-16
  Administered 2014-12-16: 60 mg via INTRAMUSCULAR

## 2014-12-16 NOTE — Progress Notes (Signed)
   Subjective:    Patient ID: Carrie Myers, female    DOB: 02-21-1946, 69 y.o.   MRN: [id number]  HPI    Review of Systems     Objective:   Physical Exam        Assessment & Plan:

## 2014-12-16 NOTE — Progress Notes (Signed)
Subjective:    Patient ID: Carrie Myers, female    DOB: 1945/05/29, 69 y.o.   MRN: [id number]  HPI  NOTE:  The patient is a 69 y.o. female who returns to the [facility] for further evaluation and treatment of pain involving the lumbar and lower extremity region with pain involving the region of the hip and buttocks as well.  Prior studies reveal patient to be with evidence of MRI with multilevel degenerative changes of the lumbar spine. Patient is with reproduction of severe pain with palpation over the PSIS and PII S regions and is with positive Patrick's maneuver is well as tenderness to palpation of the greater trochanteric regions .  There is concern regarding patient's pain being due to significant component of sacroiliac joint dysfunction. We have discussed patient's condition.  The risks, benefits, and expectations of the procedure have been discussed and explained to the patient who is understanding and wishes to proceed with interventional treatment as planned.    PROCEDURE: Sacroiliac joint on the left side injection with IV Versed, IV Fentanyl conscious sedation, EKG, blood pressure, pulse, pulse oximetry monitoring.  Procedure was performed with patient in prone position under fluoroscopic guidance.    Inferior pole sacroiliac joint injection on the left side.  With patient in prone position and Betadine prep of proposed entry site of accomplished, 22 -gauge needle was inserted at the inferior pole of the sacroiliac joint on the left under fluoroscopic guidance.  Following documentation of needle placement at the inferior pole of the sacroiliac joint on the left, a total of 1cc 0.25% bupivacaine with kenalog was injected for left inferior pole sacroiliac joint injection.  Needle was removed.    Superior pole sacroiliac joint injection on the left side.  With patient in prone position and Betadine prep of proposed entry site of accomplished, 22-gauge needle was inserted  at the superior pole of the sacroiliac joint on the left under fluoroscopic guidance.  Following documentation of needle placement at the inferior pole of the sacroiliac joint on the left, a total of 1cc 0.25% bupivacaine with kenalog was injected for left superior pole sacroiliac joint injection.  Needle was removed.   SACROILIAC JOINT INJECTION ON THE RIGHT SIDE  The procedure was performed on the right side exactly as was performed on the left side utilizing the same technique and under fluoroscopic guidance for both superior pole sacroiliac joint injections and inferior pole sacroiliac joint injections on the right side  Myoneural block injections of the gluteal musculature region Following Betadine prep of proposed entry site a 22-gauge needle was inserted in the gluteal musculature region and following negative aspiration for cc of 0.25% bupivacaine with Norflex was injected for myoneural block injection of the gluteal musculature region 2     Patient tolerated procedure well.  A total of 10 mg Kenalog was utilized for the entire procedure.  PLAN:    1. Medications: Will continue presently prescribed medications Valium and hydrocodone acetaminophen. 2. Patient to follow up with primary care physician  Dr.Berglund for evaluation of blood pressure and general medical condition status post sacroiliac joint injection performed on today's visit. 3. Surgical evaluation is discussed. 4. Neurological evaluation with PNCV/EMG studies as discussed. 5. Patient may be candidate for radiofrequency procedures, Botox injections, implantation-type procedures, and other treatment pending response to treatment and follow-up evaluation. 6. Patient has been advised to adhere to proper body mechanics and to call Pain Management Center prior to scheduled return appointment should there  be significant change in condition or patient have other concerns regarding condition prior to scheduled return appointment.     Patient with understanding and in agreement with suggested treatment plan.    Review of Systems     Objective:   Physical Exam        Assessment & Plan:

## 2014-12-16 NOTE — Patient Instructions (Addendum)
PLAN  Continue present medication Valium and hydrocodone acetaminophen and begin taking antibiotic doxycycline as prescribed. Please obtain your antibiotic today and begin taking antibiotic today  F/U PCP Dr.Berglund  for evaliation of  BP and general medical  condition.  F/U surgical evaluation. May consider pending follow-up evaluations  F/U neurological evaluation. May consider pending follow-up evaluations  May consider radiofrequency rhizolysis or intraspinal procedures pending response to present treatment and F/U evaluation.  Patient to call Pain Management Center should patient have concerns prior to scheduled return appointment.  Pain Management Discharge Instructions  General Discharge Instructions :  If you need to reach your doctor call: Monday-Friday 8:00 am - 4:00 pm at [phone] or toll free [phone].  After clinic hours [phone] to have operator reach doctor.  Bring all of your medication bottles to all your appointments in the pain clinic.  To cancel or reschedule your appointment with Pain Management please remember to call 24 hours in advance to avoid a fee.  Refer to the educational materials which you have been given on: General Risks, I had my Procedure. Discharge Instructions, Post Sedation.  Post Procedure Instructions:  The drugs you were given will stay in your system until tomorrow, so for the next 24 hours you should not drive, make any legal decisions or drink any alcoholic beverages.  You may eat anything you prefer, but it is better to start with liquids then soups and crackers, and gradually work up to solid foods.  Please notify your doctor immediately if you have any unusual bleeding, trouble breathing or pain that is not related to your normal pain.  Depending on the type of procedure that was done, some parts of your body may feel week and/or numb.  This usually clears up by tonight or the next day.  Walk with the use of an assistive  device or accompanied by an adult for the 24 hours.  You may use ice on the affected area for the first 24 hours.  Put ice in a Ziploc bag and cover with a towel and place against area 15 minutes on 15 minutes off.  You may switch to heat after 24 hours.GENERAL RISKS AND COMPLICATIONS  What are the risk, side effects and possible complications? Generally speaking, most procedures are safe.  However, with any procedure there are risks, side effects, and the possibility of complications.  The risks and complications are dependent upon the sites that are lesioned, or the type of nerve block to be performed.  The closer the procedure is to the spine, the more serious the risks are.  Great care is taken when placing the radio frequency needles, block needles or lesioning probes, but sometimes complications can occur. 1. Infection: Any time there is an injection through the skin, there is a risk of infection.  This is why sterile conditions are used for these blocks.  There are four possible types of infection. 1. Localized skin infection. 2. Central Nervous System Infection-This can be in the form of Meningitis, which can be deadly. 3. Epidural Infections-This can be in the form of an epidural abscess, which can cause pressure inside of the spine, causing compression of the spinal cord with subsequent paralysis. This would require an emergency surgery to decompress, and there are no guarantees that the patient would recover from the paralysis. 4. Discitis-This is an infection of the intervertebral discs.  It occurs in about 1% of discography procedures.  It is difficult to treat and it may lead to surgery.  2. Pain: the needles have to go through skin and soft tissues, will cause soreness.       3. Damage to internal structures:  The nerves to be lesioned may be near blood vessels or    other nerves which can be potentially damaged.       4. Bleeding: Bleeding is more common if the patient is taking  blood thinners such as  aspirin, Coumadin, Ticiid, Plavix, etc., or if he/she have some genetic predisposition  such as hemophilia. Bleeding into the spinal canal can cause compression of the spinal  cord with subsequent paralysis.  This would require an emergency surgery to  decompress and there are no guarantees that the patient would recover from the  paralysis.       5. Pneumothorax:  Puncturing of a lung is a possibility, every time a needle is introduced in  the area of the chest or upper back.  Pneumothorax refers to free air around the  collapsed lung(s), inside of the thoracic cavity (chest cavity).  Another two possible  complications related to a similar event would include: Hemothorax and Chylothorax.   These are variations of the Pneumothorax, where instead of air around the collapsed  lung(s), you may have blood or chyle, respectively.       6. Spinal headaches: They may occur with any procedures in the area of the spine.       7. Persistent CSF (Cerebro-Spinal Fluid) leakage: This is a rare problem, but may occur  with prolonged intrathecal or epidural catheters either due to the formation of a fistulous  track or a dural tear.       8. Nerve damage: By working so close to the spinal cord, there is always a possibility of  nerve damage, which could be as serious as a permanent spinal cord injury with  paralysis.       9. Death:  Although rare, severe deadly allergic reactions known as "Anaphylactic  reaction" can occur to any of the medications used.      10. Worsening of the symptoms:  We can always make thing worse.  What are the chances of something like this happening? Chances of any of this occuring are extremely low.  By statistics, you have more of a chance of getting killed in a motor vehicle accident: while driving to the hospital than any of the above occurring .  Nevertheless, you should be aware that they are possibilities.  In general, it is similar to taking a shower.  Everybody  knows that you can slip, hit your head and get killed.  Does that mean that you should not shower again?  Nevertheless always keep in mind that statistics do not mean anything if you happen to be on the wrong side of them.  Even if a procedure has a 1 (one) in a 1,000,000 (million) chance of going wrong, it you happen to be that one..Also, keep in mind that by statistics, you have more of a chance of having something go wrong when taking medications.  Who should not have this procedure? If you are on a blood thinning medication (e.g. Coumadin, Plavix, see list of "Blood Thinners"), or if you have an active infection going on, you should not have the procedure.  If you are taking any blood thinners, please inform your physician.  How should I prepare for this procedure?  Do not eat or drink anything at least six hours prior to the procedure.  Bring a driver  with you .  It cannot be a taxi.  Come accompanied by an adult that can drive you back, and that is strong enough to help you if your legs get weak or numb from the local anesthetic.  Take all of your medicines the morning of the procedure with just enough water to swallow them.  If you have diabetes, make sure that you are scheduled to have your procedure done first thing in the morning, whenever possible.  If you have diabetes, take only half of your insulin dose and notify our nurse that you have done so as soon as you arrive at the clinic.  If you are diabetic, but only take blood sugar pills (oral hypoglycemic), then do not take them on the morning of your procedure.  You may take them after you have had the procedure.  Do not take aspirin or any aspirin-containing medications, at least eleven (11) days prior to the procedure.  They may prolong bleeding.  Wear loose fitting clothing that may be easy to take off and that you would not mind if it got stained with Betadine or blood.  Do not wear any jewelry or perfume  Remove any nail  coloring.  It will interfere with some of our monitoring equipment.  NOTE: Remember that this is not meant to be interpreted as a complete list of all possible complications.  Unforeseen problems may occur.  BLOOD THINNERS The following drugs contain aspirin or other products, which can cause increased bleeding during surgery and should not be taken for 2 weeks prior to and 1 week after surgery.  If you should need take something for relief of minor pain, you may take acetaminophen which is found in Tylenol,m Datril, Anacin-3 and Panadol. It is not blood thinner. The products listed below are.  Do not take any of the products listed below in addition to any listed on your instruction sheet.  A.P.C or A.P.C with Codeine Codeine Phosphate Capsules #3 Ibuprofen Ridaura  ABC compound Congesprin Imuran rimadil  Advil Cope Indocin Robaxisal  Alka-Seltzer Effervescent Pain Reliever and Antacid Coricidin or Coricidin-D  Indomethacin Rufen  Alka-Seltzer plus Cold Medicine Cosprin Ketoprofen S-A-C Tablets  Anacin Analgesic Tablets or Capsules Coumadin Korlgesic Salflex  Anacin Extra Strength Analgesic tablets or capsules CP-2 Tablets Lanoril Salicylate  Anaprox Cuprimine Capsules Levenox Salocol  Anexsia-D Dalteparin Magan Salsalate  Anodynos Darvon compound Magnesium Salicylate Sine-off  Ansaid Dasin Capsules Magsal Sodium Salicylate  Anturane Depen Capsules Marnal Soma  APF Arthritis pain formula Dewitt's Pills Measurin Stanback  Argesic Dia-Gesic Meclofenamic Sulfinpyrazone  Arthritis Bayer Timed Release Aspirin Diclofenac Meclomen Sulindac  Arthritis pain formula Anacin Dicumarol Medipren Supac  Analgesic (Safety coated) Arthralgen Diffunasal Mefanamic Suprofen  Arthritis Strength Bufferin Dihydrocodeine Mepro Compound Suprol  Arthropan liquid Dopirydamole Methcarbomol with Aspirin Synalgos  ASA tablets/Enseals Disalcid Micrainin Tagament  Ascriptin Doan's Midol Talwin  Ascriptin A/D Dolene  Mobidin Tanderil  Ascriptin Extra Strength Dolobid Moblgesic Ticlid  Ascriptin with Codeine Doloprin or Doloprin with Codeine Momentum Tolectin  Asperbuf Duoprin Mono-gesic Trendar  Aspergum Duradyne Motrin or Motrin IB Triminicin  Aspirin plain, buffered or enteric coated Durasal Myochrisine Trigesic  Aspirin Suppositories Easprin Nalfon Trillsate  Aspirin with Codeine Ecotrin Regular or Extra Strength Naprosyn Uracel  Atromid-S Efficin Naproxen Ursinus  Auranofin Capsules Elmiron Neocylate Vanquish  Axotal Emagrin Norgesic Verin  Azathioprine Empirin or Empirin with Codeine Normiflo Vitamin E  Azolid Emprazil Nuprin Voltaren  Bayer Aspirin plain, buffered or children's or timed BC Tablets or powders  Encaprin Orgaran Warfarin Sodium  Buff-a-Comp Enoxaparin Orudis Zorpin  Buff-a-Comp with Codeine Equegesic Os-Cal-Gesic   Buffaprin Excedrin plain, buffered or Extra Strength Oxalid   Bufferin Arthritis Strength Feldene Oxphenbutazone   Bufferin plain or Extra Strength Feldene Capsules Oxycodone with Aspirin   Bufferin with Codeine Fenoprofen Fenoprofen Pabalate or Pabalate-SF   Buffets II Flogesic Panagesic   Buffinol plain or Extra Strength Florinal or Florinal with Codeine Panwarfarin   Buf-Tabs Flurbiprofen Penicillamine   Butalbital Compound Four-way cold tablets Penicillin   Butazolidin Fragmin Pepto-Bismol   Carbenicillin Geminisyn Percodan   Carna Arthritis Reliever Geopen Persantine   Carprofen Gold's salt Persistin   Chloramphenicol Goody's Phenylbutazone   Chloromycetin Haltrain Piroxlcam   Clmetidine heparin Plaquenil   Cllnoril Hyco-pap Ponstel   Clofibrate Hydroxy chloroquine Propoxyphen         Before stopping any of these medications, be sure to consult the physician who ordered them.  Some, such as Coumadin (Warfarin) are ordered to prevent or treat serious conditions such as "deep thrombosis", "pumonary embolisms", and other heart problems.  The amount of time that  you may need off of the medication may also vary with the medication and the reason for which you were taking it.  If you are taking any of these medications, please make sure you notify your pain physician before you undergo any procedures.

## 2014-12-16 NOTE — Progress Notes (Signed)
Safety precautions to be maintained throughout the outpatient stay will include: orient to surroundings, keep bed in low position, maintain call bell within reach at all times, provide assistance with transfer out of bed and ambulation.  

## 2014-12-17 ENCOUNTER — Telehealth: Payer: Self-pay | Admitting: *Deleted

## 2014-12-17 ENCOUNTER — Telehealth: Payer: Self-pay | Admitting: Pain Medicine

## 2014-12-17 NOTE — Telephone Encounter (Signed)
Nurses and [name] Please call patient immediately for me to speak with patient I dailed  patient's number [phone] twice at 12:35 PM today and could not get number to go through Please call patient now and get patient on telephone so that I can speak with patient

## 2014-12-17 NOTE — Telephone Encounter (Signed)
Non working number listed

## 2014-12-17 NOTE — Telephone Encounter (Signed)
Pt wants to talk to you about how she was discharged. Pt thinks Dr Primus Bravo should have come in to see pt before she left . She states she could not stand on legs and her legs were "mush" . Pt states the discharge nurse didn't check her legs or see if ok for her leave with her legs being numb. Pt would like to talk with Dr Primus Bravo directly.  Thanks

## 2014-12-17 NOTE — Telephone Encounter (Signed)
Patient spoke with Dr. Primus Bravo and the situation was explained. Patient aware that blocks can cause transient weakness and numbness. Denies any complications post procedure.

## 2014-12-23 ENCOUNTER — Ambulatory Visit: Payer: Medicare Other | Admitting: Pain Medicine

## 2014-12-23 DIAGNOSIS — I6523 Occlusion and stenosis of bilateral carotid arteries: Secondary | ICD-10-CM | POA: Diagnosis not present

## 2014-12-23 DIAGNOSIS — I10 Essential (primary) hypertension: Secondary | ICD-10-CM | POA: Diagnosis not present

## 2014-12-23 DIAGNOSIS — I214 Non-ST elevation (NSTEMI) myocardial infarction: Secondary | ICD-10-CM | POA: Diagnosis not present

## 2014-12-23 DIAGNOSIS — E782 Mixed hyperlipidemia: Secondary | ICD-10-CM | POA: Diagnosis not present

## 2014-12-29 ENCOUNTER — Ambulatory Visit
Admission: EM | Admit: 2014-12-29 | Discharge: 2014-12-29 | Disposition: A | Discharge status: Home / Self Care | Payer: Medicare Other | Attending: Family Medicine | Admitting: Family Medicine

## 2014-12-29 ENCOUNTER — Encounter: Payer: Self-pay | Admitting: Gynecology

## 2014-12-29 ENCOUNTER — Ambulatory Visit: Payer: Medicare Other

## 2014-12-29 DIAGNOSIS — I2510 Atherosclerotic heart disease of native coronary artery without angina pectoris: Secondary | ICD-10-CM | POA: Insufficient documentation

## 2014-12-29 DIAGNOSIS — S92301A Fracture of unspecified metatarsal bone(s), right foot, initial encounter for closed fracture: Secondary | ICD-10-CM

## 2014-12-29 DIAGNOSIS — Z7982 Long term (current) use of aspirin: Secondary | ICD-10-CM | POA: Insufficient documentation

## 2014-12-29 DIAGNOSIS — S92351A Displaced fracture of fifth metatarsal bone, right foot, initial encounter for closed fracture: Secondary | ICD-10-CM | POA: Diagnosis not present

## 2014-12-29 DIAGNOSIS — E785 Hyperlipidemia, unspecified: Secondary | ICD-10-CM | POA: Insufficient documentation

## 2014-12-29 DIAGNOSIS — M797 Fibromyalgia: Secondary | ICD-10-CM | POA: Diagnosis not present

## 2014-12-29 DIAGNOSIS — K219 Gastro-esophageal reflux disease without esophagitis: Secondary | ICD-10-CM | POA: Insufficient documentation

## 2014-12-29 DIAGNOSIS — I10 Essential (primary) hypertension: Secondary | ICD-10-CM | POA: Insufficient documentation

## 2014-12-29 DIAGNOSIS — I252 Old myocardial infarction: Secondary | ICD-10-CM | POA: Insufficient documentation

## 2014-12-29 DIAGNOSIS — X58XXXA Exposure to other specified factors, initial encounter: Secondary | ICD-10-CM | POA: Diagnosis not present

## 2014-12-29 DIAGNOSIS — Z87891 Personal history of nicotine dependence: Secondary | ICD-10-CM | POA: Diagnosis not present

## 2014-12-29 DIAGNOSIS — M069 Rheumatoid arthritis, unspecified: Secondary | ICD-10-CM | POA: Diagnosis not present

## 2014-12-29 DIAGNOSIS — S99921A Unspecified injury of right foot, initial encounter: Secondary | ICD-10-CM | POA: Diagnosis present

## 2014-12-29 MED ORDER — HYDROCODONE-ACETAMINOPHEN 5-325 MG PO TABS: 1.0000 | ORAL_TABLET | Freq: Four times a day (QID) | ORAL | Status: DC | PRN

## 2014-12-29 NOTE — ED Provider Notes (Signed)
CSN: [id number]     Arrival date & time 12/29/14  3300 History   First MD Initiated Contact with Patient 12/29/14 1011     Chief Complaint  Patient presents with  . Foot Injury   (Consider location/radiation/quality/duration/timing/severity/associated sxs/prior Treatment) HPI   69 year old female presents with a twisted right foot that occurred yesterday while she was trying on wedge high heels. She states that her foot twisted sideways into inversion and having pain over the lateral foot mostly at the base of the fifth metatarsal where she states that initially was a large egg has since subsided with elevation ice. However she still has significant pain and is finding that is on enteral to bear any weight on that foot and has been using 2 canes at home to around. She is also found that she if she drags her foot on a wooden floor also easier to move.  Past Medical History  Diagnosis Date  . Allergy   . Arthritis   . GERD (gastroesophageal reflux disease)   . Hyperlipidemia   . Hypertension   . Fibromyalgia   . Osteoarthritis   . Thyroid disease   . Kidney stones   . Myocardial infarction ([place]) 2008  . Rheumatoid arthritis [facility])    Past Surgical History  Procedure Laterality Date  . Coronary angioplasty with stent placement    . Appendectomy    . Cesarean section    . Abdominal hysterectomy    . Bunionectomy Bilateral   . Dental implant      [age]   Family History  Problem Relation Age of Onset  . Stroke Mother   . Heart disease Father   . Cancer Sister   . Diabetes Sister    Social History  Substance Use Topics  . Smoking status: Former Smoker    Types: Cigarettes  . Smokeless tobacco: None  . Alcohol Use: No   OB History    No data available     Review of Systems  Constitutional: Positive for activity change. Negative for fever, chills and fatigue.  Musculoskeletal: Positive for joint swelling and gait problem.  Skin: Positive for color change.  All other  systems reviewed and are negative.   Allergies  Augmentin; Codeine; Shellfish allergy; and Sulfa antibiotics  Home Medications   Prior to Admission medications   Medication Sig Start Date End Date Taking? Authorizing Provider  aspirin EC 81 MG tablet Take 81 mg by mouth daily.   Yes Historical Provider, MD  citalopram (CELEXA) 20 MG tablet Take 20 mg by mouth daily.   Yes Historical Provider, MD  cyclobenzaprine (FLEXERIL) 10 MG tablet Take 10 mg by mouth at bedtime.    Yes Historical Provider, MD  diazepam (VALIUM) 5 MG tablet Limit one tablet by mouth per day if tolerated 11/05/14  Yes Mohammed Kindle, MD  diazepam (VALIUM) 5 MG tablet Limit one tab by mouth per day or twice per day if tolerated 12/03/14  Yes Mohammed Kindle, MD  Diclofenac-Misoprostol 75-0.2 MG TBEC Take 1 tablet by mouth 2 (two) times daily.   Yes Historical Provider, MD  doxycycline (VIBRA-TABS) 100 MG tablet Take 1 tablet (100 mg total) by mouth 2 (two) times daily. 11/13/14  Yes Mohammed Kindle, MD  esomeprazole (NEXIUM) 20 MG capsule Take 40 mg by mouth daily.   Yes Historical Provider, MD  HYDROcodone-acetaminophen (NORCO/VICODIN) 5-325 MG tablet Take 1 tablet by mouth every 6 (six) hours as needed for moderate pain.   Yes Historical Provider, MD  levothyroxine (  SYNTHROID, LEVOTHROID) 88 MCG tablet Take 88 mcg by mouth daily before breakfast.   Yes Historical Provider, MD  losartan (COZAAR) 50 MG tablet Take 50 mg by mouth daily.   Yes Historical Provider, MD  Multiple Vitamins-Minerals (CENTRUM SILVER ADULT 50+) TABS Take 1 tablet by mouth daily.   Yes Historical Provider, MD  pravastatin (PRAVACHOL) 80 MG tablet Take 80 mg by mouth daily.   Yes Historical Provider, MD  traMADol (ULTRAM) 50 MG tablet Take 50 mg by mouth 2 (two) times daily.    Yes Historical Provider, MD  HYDROcodone-acetaminophen (NORCO/VICODIN) 5-325 MG tablet Take 1-2 tablets by mouth every 6 (six) hours as needed for severe pain. 12/29/14   Lorin Picket, PA-C   Meds Ordered and Administered this Visit  Medications - No data to display  BP 129/92 mmHg  Pulse 74  Temp(Src) 97.9 F (36.6 C) (Oral)  Resp 16  Ht 5\' 8"  (1.727 m)  Wt 175 lb (79.379 kg)  BMI 26.61 kg/m2  SpO2 98% No data found.   Physical Exam  Constitutional: She is oriented to person, place, and time. She appears well-developed and well-nourished. No distress.  HENT:  Head: Normocephalic and atraumatic.  Eyes: Pupils are equal, round, and reactive to light.  Neck: Neck supple.  Musculoskeletal:  Examination of the right foot shows some swelling at the base of the fifth metatarsal. Tenderness is localized to this area. She has no midfoot tenderness no malleoli tenderness and has good range of motion of her ankle. Distal metatarsals and tarsals are not painful. His well-healed scars over the lateral great toes from previous bunionectomies  Neurological: She is alert and oriented to person, place, and time.  Skin: Skin is warm and dry. She is not diaphoretic.  Psychiatric: She has a normal mood and affect. Her behavior is normal. Judgment and thought content normal.  Nursing note and vitals reviewed.   ED Course  Procedures (including critical care time)  Labs Review Labs Reviewed - No data to display  Imaging Review Dg Foot Complete Right  12/29/2014  CLINICAL DATA:  Pt with right foot pain at the base of the 5th after falling while wearing a pair of wedge shoes yesterday. Pt has hx of trauma to right ankle and foot 43 years ago. Pt states that the lateral side of her right foot had bone piec.*comment was truncated* EXAM: RIGHT FOOT COMPLETE - 3+ VIEW COMPARISON:  None. FINDINGS: Prior osteotomy involving the first metatarsal. Mild osteoarthritis involving the first metatarsal phalangeal joint. Nondisplaced fracture at the base of the fifth metatarsal. IMPRESSION: Fracture at the base of fifth metatarsal. Electronically Signed   By: Abigail Miyamoto M.D.   On:  12/29/2014 11:04     Visual Acuity Review  Right Eye Distance:   Left Eye Distance:   Bilateral Distance:    Right Eye Near:   Left Eye Near:    Bilateral Near:      CAM walker boot applied and crutches provided.    MDM   1. Fracture of 5th metatarsal, right, closed, initial encounter    Discharge Medication List as of 12/29/2014 11:27 AM    Plan: 1. Test/x-ray results and diagnosis reviewed with patient 2. rx as per orders; risks, benefits, potential side effects reviewed with patient 3. Recommend supportive treatment with elevation/ice. 4. F/u Dr. Vickki Muff next week   Lorin Picket, PA-C 12/29/14 1714

## 2014-12-29 NOTE — ED Notes (Signed)
Patient c/o twisted right foot ankle x yesterday while  trying on a high wedge shoe at the store. Per patient painfull to walk on and swollen.

## 2014-12-29 NOTE — Discharge Instructions (Signed)
Cast or Splint Care °Casts and splints support injured limbs and keep bones from moving while they heal. It is important to care for your cast or splint at home.   °HOME CARE INSTRUCTIONS °· Keep the cast or splint uncovered during the drying period. It can take 24 to 48 hours to dry if it is made of plaster. A fiberglass cast will dry in less than 1 hour. °· Do not rest the cast on anything harder than a pillow for the first 24 hours. °· Do not put weight on your injured limb or apply pressure to the cast until your health care provider gives you permission. °· Keep the cast or splint dry. Wet casts or splints can lose their shape and may not support the limb as well. A wet cast that has lost its shape can also create harmful pressure on your skin when it dries. Also, wet skin can become infected. °· Cover the cast or splint with a plastic bag when bathing or when out in the rain or snow. If the cast is on the trunk of the body, take sponge baths until the cast is removed. °· If your cast does become wet, dry it with a towel or a blow dryer on the cool setting only. °· Keep your cast or splint clean. Soiled casts may be wiped with a moistened cloth. °· Do not place any hard or soft foreign objects under your cast or splint, such as cotton, toilet paper, lotion, or powder. °· Do not try to scratch the skin under the cast with any object. The object could get stuck inside the cast. Also, scratching could lead to an infection. If itching is a problem, use a blow dryer on a cool setting to relieve discomfort. °· Do not trim or cut your cast or remove padding from inside of it. °· Exercise all joints next to the injury that are not immobilized by the cast or splint. For example, if you have a long leg cast, exercise the hip joint and toes. If you have an arm cast or splint, exercise the shoulder, elbow, thumb, and fingers. °· Elevate your injured arm or leg on 1 or 2 pillows for the first 1 to 3 days to decrease  swelling and pain. It is best if you can comfortably elevate your cast so it is higher than your heart. °SEEK MEDICAL CARE IF:  °· Your cast or splint cracks. °· Your cast or splint is too tight or too loose. °· You have unbearable itching inside the cast. °· Your cast becomes wet or develops a soft spot or area. °· You have a bad smell coming from inside your cast. °· You get an object stuck under your cast. °· Your skin around the cast becomes red or raw. °· You have new pain or worsening pain after the cast has been applied. °SEEK IMMEDIATE MEDICAL CARE IF:  °· You have fluid leaking through the cast. °· You are unable to move your fingers or toes. °· You have discolored (blue or white), cool, painful, or very swollen fingers or toes beyond the cast. °· You have tingling or numbness around the injured area. °· You have severe pain or pressure under the cast. °· You have any difficulty with your breathing or have shortness of breath. °· You have chest pain. °  °This information is not intended to replace advice given to you by your health care provider. Make sure you discuss any questions you have with your health care   provider.   Document Released: 02/27/2000 Document Revised: 12/20/2012 Document Reviewed: 09/07/2012 Elsevier Interactive Patient Education 2016 [employer]  Metatarsal Fracture A metatarsal fracture is a break in a metatarsal bone. Metatarsal bones connect your toe bones to your ankle bones. CAUSES This type of fracture may be caused by:  A sudden twisting of your foot.  A fall onto your foot.  Overuse or repetitive exercise. RISK FACTORS This condition is more likely to develop in people who:  Play contact sports.  Have a bone disease.  Have a low calcium level. SYMPTOMS Symptoms of this condition include:  Pain that is worse when walking or standing.  Pain when pressing on the foot or moving the toes.  Swelling.  Bruising on the top or bottom of the foot.  A  foot that appears shorter than the other one. DIAGNOSIS This condition is diagnosed with a physical exam. You may also have imaging tests, such as:  X-rays.  A CT scan.  MRI. TREATMENT Treatment for this condition depends on its severity and whether a bone has moved out of place. Treatment may involve:  Rest.  Wearing foot support such as a cast, splint, or boot for several weeks.  Using crutches.  Surgery to move bones back into the right position. Surgery is usually needed if there are many pieces of broken bone or bones that are very out of place (displaced fracture).  Physical therapy. This may be needed to help you regain full movement and strength in your foot. You will need to return to your health care provider to have X-rays taken until your bones heal. Your health care provider will look at the X-rays to make sure that your foot is healing well. HOME CARE INSTRUCTIONS  If You Have a Cast:  Do not stick anything inside the cast to scratch your skin. Doing that increases your risk of infection.  Check the skin around the cast every day. Report any concerns to your health care provider. You may put lotion on dry skin around the edges of the cast. Do not apply lotion to the skin underneath the cast.  Keep the cast clean and dry. If You Have a Splint or a Supportive Boot:  Wear it as directed by your health care provider. Remove it only as directed by your health care provider.  Loosen it if your toes become numb and tingle, or if they turn cold and blue.  Keep it clean and dry. Bathing  Do not take baths, swim, or use a hot tub until your health care provider approves. Ask your health care provider if you can take showers. You may only be allowed to take sponge baths for bathing.  If your health care provider approves bathing and showering, cover the cast or splint with a watertight plastic bag to protect it from water. Do not let the cast or splint get wet. Managing  Pain, Stiffness, and Swelling  If directed, apply ice to the injured area (if you have a splint, not a cast).  Put ice in a plastic bag.  Place a towel between your skin and the bag.  Leave the ice on for 20 minutes, 2-3 times per day.  Move your toes often to avoid stiffness and to lessen swelling.  Raise (elevate) the injured area above the level of your heart while you are sitting or lying down. Driving  Do not drive or operate heavy machinery while taking pain medicine.  Do not drive while wearing foot support  on a foot that you use for driving. Activity  Return to your normal activities as directed by your health care provider. Ask your health care provider what activities are safe for you.  Perform exercises as directed by your health care provider or physical therapist. Safety  Do not use the injured foot to support your body weight until your health care provider says that you can. Use crutches as directed by your health care provider. General Instructions  Do not put pressure on any part of the cast or splint until it is fully hardened. This may take several hours.  Do not use any tobacco products, including cigarettes, chewing tobacco, or e-cigarettes. Tobacco can delay bone healing. If you need help quitting, ask your health care provider.  Take medicines only as directed by your health care provider.  Keep all follow-up visits as directed by your health care provider. This is important. SEEK MEDICAL CARE IF:  You have a fever.  Your cast, splint, or boot is too loose or too tight.  Your cast, splint, or boot is damaged.  Your pain medicine is not helping.  You have pain, tingling, or numbness in your foot that is not going away. SEEK IMMEDIATE MEDICAL CARE IF:  You have severe pain.  You have tingling or numbness in your foot that is getting worse.  Your foot feels cold or becomes numb.  Your foot changes color.   This information is not intended to  replace advice given to you by your health care provider. Make sure you discuss any questions you have with your health care provider.   Document Released: 11/21/2001 Document Revised: 07/16/2014 Document Reviewed: 12/26/2013 Elsevier Interactive Patient Education [employer].

## 2014-12-30 DIAGNOSIS — S92354A Nondisplaced fracture of fifth metatarsal bone, right foot, initial encounter for closed fracture: Secondary | ICD-10-CM | POA: Diagnosis not present

## 2015-01-14 ENCOUNTER — Ambulatory Visit: Payer: Medicare Other | Attending: Pain Medicine | Admitting: Pain Medicine

## 2015-01-14 ENCOUNTER — Encounter: Payer: Self-pay | Admitting: Pain Medicine

## 2015-01-14 VITALS — BP 108/70 | HR 84 | Temp 98.1°F | Resp 15 | Ht 68.0 in | Wt 175.0 lb

## 2015-01-14 DIAGNOSIS — M7060 Trochanteric bursitis, unspecified hip: Secondary | ICD-10-CM | POA: Diagnosis not present

## 2015-01-14 DIAGNOSIS — M533 Sacrococcygeal disorders, not elsewhere classified: Secondary | ICD-10-CM | POA: Insufficient documentation

## 2015-01-14 DIAGNOSIS — S92901A Unspecified fracture of right foot, initial encounter for closed fracture: Secondary | ICD-10-CM

## 2015-01-14 DIAGNOSIS — M545 Low back pain: Secondary | ICD-10-CM | POA: Diagnosis present

## 2015-01-14 DIAGNOSIS — M5136 Other intervertebral disc degeneration, lumbar region: Secondary | ICD-10-CM | POA: Insufficient documentation

## 2015-01-14 DIAGNOSIS — M79605 Pain in left leg: Secondary | ICD-10-CM | POA: Diagnosis present

## 2015-01-14 DIAGNOSIS — X58XXXA Exposure to other specified factors, initial encounter: Secondary | ICD-10-CM | POA: Insufficient documentation

## 2015-01-14 DIAGNOSIS — G588 Other specified mononeuropathies: Secondary | ICD-10-CM

## 2015-01-14 DIAGNOSIS — M47816 Spondylosis without myelopathy or radiculopathy, lumbar region: Secondary | ICD-10-CM

## 2015-01-14 DIAGNOSIS — M19011 Primary osteoarthritis, right shoulder: Secondary | ICD-10-CM | POA: Diagnosis not present

## 2015-01-14 DIAGNOSIS — M19019 Primary osteoarthritis, unspecified shoulder: Secondary | ICD-10-CM | POA: Insufficient documentation

## 2015-01-14 DIAGNOSIS — M5430 Sciatica, unspecified side: Secondary | ICD-10-CM | POA: Diagnosis not present

## 2015-01-14 DIAGNOSIS — M797 Fibromyalgia: Secondary | ICD-10-CM

## 2015-01-14 DIAGNOSIS — S92909A Unspecified fracture of unspecified foot, initial encounter for closed fracture: Secondary | ICD-10-CM | POA: Diagnosis not present

## 2015-01-14 DIAGNOSIS — M47817 Spondylosis without myelopathy or radiculopathy, lumbosacral region: Secondary | ICD-10-CM | POA: Diagnosis not present

## 2015-01-14 DIAGNOSIS — M79604 Pain in right leg: Secondary | ICD-10-CM | POA: Diagnosis present

## 2015-01-14 DIAGNOSIS — M7550 Bursitis of unspecified shoulder: Secondary | ICD-10-CM | POA: Insufficient documentation

## 2015-01-14 DIAGNOSIS — M5481 Occipital neuralgia: Secondary | ICD-10-CM

## 2015-01-14 DIAGNOSIS — M5030 Other cervical disc degeneration, unspecified cervical region: Secondary | ICD-10-CM

## 2015-01-14 DIAGNOSIS — M5416 Radiculopathy, lumbar region: Secondary | ICD-10-CM | POA: Diagnosis not present

## 2015-01-14 HISTORY — DX: Unspecified fracture of right foot, initial encounter for closed fracture: S92.901A

## 2015-01-14 MED ORDER — DIAZEPAM 5 MG PO TABS: ORAL_TABLET | ORAL | Status: DC

## 2015-01-14 MED ORDER — HYDROCODONE-ACETAMINOPHEN 5-325 MG PO TABS: ORAL_TABLET | ORAL | Status: DC

## 2015-01-14 NOTE — Patient Instructions (Signed)
PLAN   Continue present medication diazepam and hydrocodone acetaminophen  Ask [name] date of MRI of the shoulder  F/U PCP Dr.Berglund  for evaliation of  BP and general medical  condition  F/U surgical evaluation. Follow-up evaluation of fracture of right foot  F/U neurological evaluation. May consider pending follow-up evaluations

## 2015-01-14 NOTE — Progress Notes (Signed)
   Subjective:    Patient ID: Carrie Myers, female    DOB: Apr 28, 1945, 69 y.o.   MRN: [id number]  HPI   The patient is 69 year old female who returns to pain management for further evaluation and treatment of pain involving the lower back and lower extremity region.. The patient has significant improvement of her pain following previous block of nerves to the sacroiliac joint. Patient has done remarkably well. Patient has had a fall when patient was trying on a with shoe at which time patient failed fracturing her foot. We discussed patient's condition and will avoid additional interventional treatment at this time. We will continue medications as prescribed and will remain available to consider modification of treatment pending follow-up evaluation. Patient has had some pain involving the lower back which is been of less degree than pain had been prior to procedure. Patient states that overall she is doing fairly well considering the fracture of the foot. We will continue medications and will consider modification of treatment regimen pending follow-up evaluation. The patient was understanding and in agreement with suggested treatment plan.         Review of Systems     Objective:   Physical Exam  There was tenderness to palpation of paraspinal musculature region cervical region cervical facet region a minimal degree. There was mild tinnitus of the splenius capitis and occipitalis region with mild tenderness over the region of the trapezius levator scapula and rhomboid musculature regions. Palpation over the thoracic facet thoracic paraspinal musculature region was with mild discomfort. Patient appeared to be with bilaterally equal grip strength and was with unremarkable Spurling's maneuver and Tinel and Phalen's maneuver without increased pain of significant degree. There was some tenderness to palpation of the acromioclavicular and glenohumeral joint region. Palpation over the lumbar  paraspinal muscular region lumbar facet region was a tends to palpation with lateral bending and rotation extension and palpation of the lumbar facets reproducing mild to moderate discomfort. There was mild to moderate t tenderness o palpation over the PSIS and PII S region. Straight leg raising was tolerates approximately 30 without a definite increase of pain with dorsiflexion of the left foot. The right foot was with Velcro boot. DTRs were difficult to elicit patient had difficulty relaxing. There was mild tenderness of the PSIS and PII S region. There was mild tinnitus of the greater trochanteric region and iliotibial band region. No sensory deficit of dermatomal distribution of the lower extremities noted. Abdomen soft nontender with no costovertebral angle tenderness noted      Assessment & Plan:   Degenerative disc disease lumbar spine  Lumbar facet syndrome  Degenerative joint disease of shoulder  Sacroiliac joint dysfunction  Bursitis of shoulder  Sciatica (history of)  Greater trochanteric bursitis  Fracture of foot     PLAN   Continue present medication diazepam and hydrocodone acetaminophen  F/U PCP  Dr.F Humphrey Rolls  for evaliation of  BP and general medical  condition  F/U surgical evaluation. Follow-up evaluation of fracture of foot as discussed  F/U neurological evaluation. May consider pending follow-up evaluations  MRI of shoulder to be considered to be obtained as discussed pending follow-up evaluation  May consider radiofrequency rhizolysis or intraspinal procedures pending response to present treatment and F/U evaluation

## 2015-01-14 NOTE — Progress Notes (Signed)
Safety precautions to be maintained throughout the outpatient stay will include: orient to surroundings, keep bed in low position, maintain call bell within reach at all times, provide assistance with transfer out of bed and ambulation.  

## 2015-01-18 ENCOUNTER — Encounter: Payer: Self-pay | Admitting: Internal Medicine

## 2015-01-18 ENCOUNTER — Other Ambulatory Visit: Payer: Self-pay | Admitting: Internal Medicine

## 2015-01-18 DIAGNOSIS — I2510 Atherosclerotic heart disease of native coronary artery without angina pectoris: Secondary | ICD-10-CM | POA: Insufficient documentation

## 2015-01-18 DIAGNOSIS — K219 Gastro-esophageal reflux disease without esophagitis: Secondary | ICD-10-CM | POA: Insufficient documentation

## 2015-01-18 DIAGNOSIS — E890 Postprocedural hypothyroidism: Secondary | ICD-10-CM | POA: Insufficient documentation

## 2015-01-18 DIAGNOSIS — N6019 Diffuse cystic mastopathy of unspecified breast: Secondary | ICD-10-CM | POA: Insufficient documentation

## 2015-01-20 DIAGNOSIS — S92354D Nondisplaced fracture of fifth metatarsal bone, right foot, subsequent encounter for fracture with routine healing: Secondary | ICD-10-CM | POA: Diagnosis not present

## 2015-01-20 DIAGNOSIS — M79671 Pain in right foot: Secondary | ICD-10-CM | POA: Diagnosis not present

## 2015-02-10 DIAGNOSIS — S92354D Nondisplaced fracture of fifth metatarsal bone, right foot, subsequent encounter for fracture with routine healing: Secondary | ICD-10-CM | POA: Diagnosis not present

## 2015-02-10 DIAGNOSIS — M79671 Pain in right foot: Secondary | ICD-10-CM | POA: Diagnosis not present

## 2015-02-11 ENCOUNTER — Encounter: Payer: Medicare Other | Admitting: Pain Medicine

## 2015-02-15 ENCOUNTER — Ambulatory Visit
Admission: EM | Admit: 2015-02-15 | Discharge: 2015-02-15 | Disposition: A | Discharge status: Home / Self Care | Payer: Medicare Other | Attending: Family Medicine | Admitting: Family Medicine

## 2015-02-15 ENCOUNTER — Encounter: Payer: Self-pay | Admitting: *Deleted

## 2015-02-15 DIAGNOSIS — J0100 Acute maxillary sinusitis, unspecified: Secondary | ICD-10-CM

## 2015-02-15 DIAGNOSIS — R059 Cough, unspecified: Secondary | ICD-10-CM

## 2015-02-15 DIAGNOSIS — R05 Cough: Secondary | ICD-10-CM | POA: Diagnosis not present

## 2015-02-15 MED ORDER — LEVOFLOXACIN 500 MG PO TABS: 500.0000 mg | ORAL_TABLET | Freq: Every day | ORAL | Status: DC

## 2015-02-15 MED ORDER — HYDROCODONE-HOMATROPINE 5-1.5 MG/5ML PO SYRP: 5.0000 mL | ORAL_SOLUTION | Freq: Four times a day (QID) | ORAL | Status: DC | PRN

## 2015-02-15 NOTE — ED Notes (Signed)
Pt states that she has had a cough, chest pain, green mucus and raspy voice since for about 3 weeks

## 2015-02-15 NOTE — ED Provider Notes (Signed)
CSN: [id number]     Arrival date & time 02/15/15  0845 History   First MD Initiated Contact with Patient 02/15/15 [phone]     Chief Complaint  Patient presents with  . Cough   (Consider location/radiation/quality/duration/timing/severity/associated sxs/prior Treatment) HPI 69 year old female with a complicated past medical history including CAD status post MI, hypertension, hyperlipidemia presents to urgent care today with complaints of sinus congestion, sinus pain/pressure, and productive cough.  Patient reports that she's had sinus pressure and pain as well as congestion for 3 weeks. She reports that when she blows her nose the mucus is dark green in color. She states that her sinus pressure and pain is quite severe. She's had no relief with over-the-counter remedies at home. No associated fevers or chills. Additionally, earlier this week she developed cough as well. Cough is productive and unrelenting. Cough is also quite severe. She reports associated chest tightness and back pain from all the coughing. No known inciting factor. No relieving factors. No known exacerbating factors.   Past Medical History  Diagnosis Date  . Allergy   . Arthritis   . GERD (gastroesophageal reflux disease)   . Hyperlipidemia   . Hypertension   . Fibromyalgia   . Osteoarthritis   . Thyroid disease   . Kidney stones   . Myocardial infarction ([place]) 2008  . Rheumatoid arthritis [facility])    Past Surgical History  Procedure Laterality Date  . Coronary angioplasty with stent placement  2007  . Appendectomy    . Cesarean section    . Abdominal hysterectomy      total  . Bunionectomy Bilateral   . Dental implant      [age]  . Colonoscopy  2010    normal   Family History  Problem Relation Age of Onset  . Stroke Mother   . Heart disease Father   . Pancreatic cancer Sister   . Diabetes Sister    Social History  Substance Use Topics  . Smoking status: Former Smoker    Types: Cigarettes  . Smokeless  tobacco: None  . Alcohol Use: No   OB History    No data available     Review of Systems  Constitutional: Negative for fever.  HENT: Positive for congestion, sinus pressure and sore throat.   Respiratory: Positive for chest tightness.   Musculoskeletal: Positive for back pain.   Allergies  Augmentin; Codeine; Shellfish allergy; and Sulfa antibiotics  Home Medications   Prior to Admission medications   Medication Sig Start Date End Date Taking? Authorizing Provider  aspirin 81 MG tablet Take 1 tablet by mouth daily.    Historical Provider, MD  citalopram (CELEXA) 20 MG tablet Take 20 mg by mouth daily.    Historical Provider, MD  cyclobenzaprine (FLEXERIL) 10 MG tablet Take 10 mg by mouth at bedtime.     Historical Provider, MD  diazepam (VALIUM) 5 MG tablet Limit one tablet by mouth per day if tolerated 11/05/14   Mohammed Kindle, MD  Diclofenac-Misoprostol 75-0.2 MG TBEC Take 1 tablet by mouth 2 (two) times daily.    Historical Provider, MD  esomeprazole (NEXIUM) 20 MG capsule Take 40 mg by mouth daily.    Historical Provider, MD  HYDROcodone-acetaminophen (NORCO/VICODIN) 5-325 MG tablet Limit 3-6 tablets by mouth per day if tolerated 01/14/15   Mohammed Kindle, MD  levothyroxine (SYNTHROID, LEVOTHROID) 88 MCG tablet Take 88 mcg by mouth daily before breakfast.    Historical Provider, MD  losartan (COZAAR) 50 MG tablet Take 50  mg by mouth daily.    Historical Provider, MD  Multiple Vitamins-Minerals (CENTRUM SILVER ADULT 50+) TABS Take 1 tablet by mouth daily.    Historical Provider, MD  pravastatin (PRAVACHOL) 80 MG tablet Take 80 mg by mouth daily.    Historical Provider, MD  traMADol (ULTRAM) 50 MG tablet Take 50 mg by mouth 2 (two) times daily.     Historical Provider, MD   Meds Ordered and Administered this Visit  Medications - No data to display  BP 134/74 mmHg  Pulse 87  Temp(Src) 98.7 F (37.1 C) (Oral)  Ht 5\' 8"  (1.727 m)  Wt 177 lb (80.287 kg)  BMI 26.92 kg/m2  SpO2  98% No data found.  Physical Exam  Constitutional: She appears well-developed. No distress.  Appears fatigued.  HENT:  Head: Normocephalic and atraumatic.  Right Ear: External ear normal.  Left Ear: External ear normal.  Nose: Mucosal edema present.  Normal TMs bilaterally. Oropharynx with mild erythema. No exudate. Maxillary sinuses exquisitely tender to palpation.  Eyes: Conjunctivae are normal.  Neck: Neck supple.  Cardiovascular: Normal rate and regular rhythm.   No murmur heard. Pulmonary/Chest: Effort normal and breath sounds normal. No respiratory distress. She has no wheezes. She has no rales.  Psychiatric: She has a normal mood and affect.  Vitals reviewed.   ED Course  Procedures (including critical care time)  Labs Review Labs Reviewed - No data to display  Imaging Review No results found.   MDM   1. Acute maxillary sinusitis, recurrence not specified   2. Cough    69 year old female presents to urgent care with history and exam consistent with acute sinusitis (New problem). Additionally, patient also has a productive cough. Treating with Levaquin as it has coverage for both upper & lower respiratory tract pathogens (patient allergic to Augmentin). Treating associated cough with Hycodan.    Coral Spikes, DO 02/15/15 [phone]

## 2015-02-15 NOTE — Discharge Instructions (Signed)
Take the antibiotic daily as indicated.  Use the cough medicine as needed.  Follow up as needed  Take care  Dr. Lacinda Axon

## 2015-02-24 ENCOUNTER — Encounter: Payer: Medicare Other | Admitting: Pain Medicine

## 2015-03-04 ENCOUNTER — Ambulatory Visit: Payer: Medicare Other | Attending: Pain Medicine | Admitting: Pain Medicine

## 2015-03-04 ENCOUNTER — Encounter: Payer: Self-pay | Admitting: Pain Medicine

## 2015-03-04 VITALS — BP 125/77 | HR 82 | Resp 18 | Ht 68.0 in | Wt 178.0 lb

## 2015-03-04 DIAGNOSIS — M7550 Bursitis of unspecified shoulder: Secondary | ICD-10-CM | POA: Insufficient documentation

## 2015-03-04 DIAGNOSIS — M5416 Radiculopathy, lumbar region: Secondary | ICD-10-CM | POA: Diagnosis not present

## 2015-03-04 DIAGNOSIS — M47817 Spondylosis without myelopathy or radiculopathy, lumbosacral region: Secondary | ICD-10-CM | POA: Diagnosis not present

## 2015-03-04 DIAGNOSIS — M5136 Other intervertebral disc degeneration, lumbar region: Secondary | ICD-10-CM | POA: Insufficient documentation

## 2015-03-04 DIAGNOSIS — M79605 Pain in left leg: Secondary | ICD-10-CM | POA: Diagnosis present

## 2015-03-04 DIAGNOSIS — M19019 Primary osteoarthritis, unspecified shoulder: Secondary | ICD-10-CM | POA: Diagnosis not present

## 2015-03-04 DIAGNOSIS — M5481 Occipital neuralgia: Secondary | ICD-10-CM

## 2015-03-04 DIAGNOSIS — M7060 Trochanteric bursitis, unspecified hip: Secondary | ICD-10-CM | POA: Diagnosis not present

## 2015-03-04 DIAGNOSIS — M533 Sacrococcygeal disorders, not elsewhere classified: Secondary | ICD-10-CM | POA: Diagnosis not present

## 2015-03-04 DIAGNOSIS — S92901A Unspecified fracture of right foot, initial encounter for closed fracture: Secondary | ICD-10-CM

## 2015-03-04 DIAGNOSIS — M19011 Primary osteoarthritis, right shoulder: Secondary | ICD-10-CM

## 2015-03-04 DIAGNOSIS — M79604 Pain in right leg: Secondary | ICD-10-CM | POA: Diagnosis present

## 2015-03-04 DIAGNOSIS — M797 Fibromyalgia: Secondary | ICD-10-CM | POA: Insufficient documentation

## 2015-03-04 DIAGNOSIS — M5030 Other cervical disc degeneration, unspecified cervical region: Secondary | ICD-10-CM

## 2015-03-04 DIAGNOSIS — M47816 Spondylosis without myelopathy or radiculopathy, lumbar region: Secondary | ICD-10-CM

## 2015-03-04 DIAGNOSIS — G588 Other specified mononeuropathies: Secondary | ICD-10-CM

## 2015-03-04 DIAGNOSIS — M545 Low back pain: Secondary | ICD-10-CM | POA: Diagnosis present

## 2015-03-04 MED ORDER — LEVOFLOXACIN 500 MG PO TABS: 500.0000 mg | ORAL_TABLET | Freq: Every day | ORAL | Status: DC

## 2015-03-04 MED ORDER — DIAZEPAM 5 MG PO TABS: ORAL_TABLET | ORAL | Status: DC

## 2015-03-04 MED ORDER — HYDROCODONE-ACETAMINOPHEN 5-325 MG PO TABS: ORAL_TABLET | ORAL | Status: DC

## 2015-03-04 NOTE — Progress Notes (Signed)
Safety precautions to be maintained throughout the outpatient stay will include: orient to surroundings, keep bed in low position, maintain call bell within reach at all times, provide assistance with transfer out of bed and ambulation.  

## 2015-03-04 NOTE — Progress Notes (Signed)
Subjective:    Patient ID: Carrie Myers, female    DOB: 02/01/1946, 69 y.o.   MRN: [id number]  HPI  The patient is a 69 year old female who returns to pain management for further evaluation and treatment of pain involving the lower back lower extremity region predominantly with pain occurring in the neck upper mid lower back and lower extremity region. Patient carries diagnosis of fibromyalgia and is with multiple arthralgias and myalgias. The patient states she has had significant improvement of prior procedures performed in pain management Center with block of nerves to the sacroiliac joint providing patient with significant relief of pain. Patient continues medication hydrocodone acetaminophen and diazepam. We will continue medications as prescribed this time and we'll remain available to consider additional modifications of treatment regimen pending response to treatment and follow-up evaluation. The patient was with understanding and agreed to suggested treatment plan. Patient also has had pain involving the region of the shoulder we will proceed with scheduling MRI of the shoulder as previously had been scheduled and will consider patient for modification of treatment regimen pending follow-up evaluation. We will also discuss surgical evaluation of the shoulder and patient will proceed with surgical evaluation pending follow-up evaluation results of MRI of the shoulder as discussed. The patient was with understanding and agreed with suggested treatment plan.     Review of Systems     Objective:   Physical Exam  There was tends to palpation over the paraspinal musculatures and cervical region cervical facet region palpation which reproduces pain of mild-to-moderate degree. There was tenderness to palpation of the acromioclavicular and glenohumeral joint regions of mild to moderate degree. Palpation of the splenius capitis and occipitalis musculature region reproduced mild to moderate  discomfort. There was tends to palpation of the trapezius levator scapula and rhomboid musculature regions a moderate degree with moderate muscle spasms noted in the paraspinal muscular region of the cervical thoracic region. There was tenderness over the thoracic facet thoracic paraspinal musculature region with no crepitus of the thoracic region noted. Palpation over the lumbar paraspinal musculature region lumbar facet region was attends to palpation of moderate degree with lateral bending rotation extension and palpation over the lumbar facets reproducing moderate discomfort. Palpation over the PSIS and PII S region as well as the gluteal and piriformis musculature regions reproduce mild to moderate discomfort. There was tends to palpation of the greater trochanteric region iliotibial band region a moderate degree. Straight leg raising was tolerates approximately 20 without increased pain with dorsiflexion noted. There was negative clonus negative Homans. No definite sensory deficit or dermatomal dystrophy detected. Abdomen nontender with no costovertebral tenderness noted.      Assessment & Plan:    Degenerative disc disease lumbar spine  Lumbar facet syndrome  Degenerative joint disease of shoulder  Sacroiliac joint dysfunction  Bursitis of shoulder  Sciatica (history of)  Greater trochanteric bursitis  Fibromyalgia   PLAN    Continue present medication diazepam and hydrocodone acetaminophen and begin Levaquin as prescribe and follow-up with Dr.Berglund for further evaluation of sinus condition   Ask receptionist date of MRI of the shoulder  F/U PCP Dr.Berglund  for evaliation of  BP sinus condition and general medical  condition  F/U surgical evaluation. May consider pending follow-up evaluations  F/U neurological evaluation. May consider pending follow-up evaluations  May consider radiofrequency rhizolysis or intraspinal procedures pending response to present treatment  and F/U evaluation   Patient to call Pain Management Center should patient have concerns prior to  scheduled return appointment.

## 2015-03-04 NOTE — Patient Instructions (Addendum)
PLAN    Continue present medication diazepam and hydrocodone acetaminophen and begin Levaquin as prescribe and follow-up with Dr.Berglund for further evaluation of sinus condition   Ask receptionist date of MRI of the shoulder  F/U PCP Dr.Berglund  for evaliation of  BP sinus condition and general medical  condition  F/U surgical evaluation. May consider pending follow-up evaluations  F/U neurological evaluation. May consider pending follow-up evaluations  May consider radiofrequency rhizolysis or intraspinal procedures pending response to present treatment and F/U evaluation   Patient to call Pain Management Center should patient have concerns prior to scheduled return appointment.

## 2015-03-21 ENCOUNTER — Other Ambulatory Visit: Payer: Self-pay | Admitting: Pain Medicine

## 2015-03-27 ENCOUNTER — Telehealth: Payer: Self-pay | Admitting: *Deleted

## 2015-03-27 ENCOUNTER — Ambulatory Visit
Admission: RE | Admit: 2015-03-27 | Discharge: 2015-03-27 | Disposition: A | Discharge status: Home / Self Care | Payer: Medicare Other | Source: Ambulatory Visit | Attending: Pain Medicine | Admitting: Pain Medicine

## 2015-03-27 DIAGNOSIS — M5136 Other intervertebral disc degeneration, lumbar region: Secondary | ICD-10-CM

## 2015-03-27 DIAGNOSIS — M533 Sacrococcygeal disorders, not elsewhere classified: Secondary | ICD-10-CM

## 2015-03-27 DIAGNOSIS — M25511 Pain in right shoulder: Secondary | ICD-10-CM | POA: Diagnosis not present

## 2015-03-27 DIAGNOSIS — M25411 Effusion, right shoulder: Secondary | ICD-10-CM | POA: Diagnosis not present

## 2015-03-27 DIAGNOSIS — M19011 Primary osteoarthritis, right shoulder: Secondary | ICD-10-CM

## 2015-03-27 DIAGNOSIS — G588 Other specified mononeuropathies: Secondary | ICD-10-CM

## 2015-03-27 DIAGNOSIS — M5030 Other cervical disc degeneration, unspecified cervical region: Secondary | ICD-10-CM

## 2015-03-27 DIAGNOSIS — M797 Fibromyalgia: Secondary | ICD-10-CM

## 2015-03-27 DIAGNOSIS — S92901A Unspecified fracture of right foot, initial encounter for closed fracture: Secondary | ICD-10-CM

## 2015-03-27 DIAGNOSIS — M5481 Occipital neuralgia: Secondary | ICD-10-CM

## 2015-03-27 DIAGNOSIS — M47816 Spondylosis without myelopathy or radiculopathy, lumbar region: Secondary | ICD-10-CM

## 2015-03-27 DIAGNOSIS — M51369 Other intervertebral disc degeneration, lumbar region without mention of lumbar back pain or lower extremity pain: Secondary | ICD-10-CM

## 2015-03-27 NOTE — Progress Notes (Signed)
Left message for patient to call re MRI results.

## 2015-03-27 NOTE — Progress Notes (Signed)
Left voicemail for patient to call re; her MRI results and plan per Dr Primus Bravo

## 2015-03-28 ENCOUNTER — Other Ambulatory Visit: Payer: Self-pay

## 2015-03-28 DIAGNOSIS — M25511 Pain in right shoulder: Secondary | ICD-10-CM

## 2015-03-28 NOTE — Telephone Encounter (Signed)
Notified patient of Mri results and recommendations from Dr Primus Bravo.  Pt. States that she would like a surgical eval asap with Dr Marry Guan.  Dr crisp notified of patient wishes and he instructed me to put in a surgical referral to Dr Marry Guan.

## 2015-04-01 ENCOUNTER — Ambulatory Visit: Payer: Medicare Other | Admitting: Pain Medicine

## 2015-04-03 ENCOUNTER — Encounter: Payer: Self-pay | Admitting: Pain Medicine

## 2015-04-03 ENCOUNTER — Ambulatory Visit: Payer: Medicare Other | Attending: Pain Medicine | Admitting: Pain Medicine

## 2015-04-03 VITALS — BP 144/71 | HR 81 | Temp 97.8°F | Resp 16 | Ht 68.0 in | Wt 180.0 lb

## 2015-04-03 DIAGNOSIS — M47817 Spondylosis without myelopathy or radiculopathy, lumbosacral region: Secondary | ICD-10-CM | POA: Diagnosis not present

## 2015-04-03 DIAGNOSIS — M5136 Other intervertebral disc degeneration, lumbar region: Secondary | ICD-10-CM

## 2015-04-03 DIAGNOSIS — M5481 Occipital neuralgia: Secondary | ICD-10-CM

## 2015-04-03 DIAGNOSIS — M5030 Other cervical disc degeneration, unspecified cervical region: Secondary | ICD-10-CM

## 2015-04-03 DIAGNOSIS — M533 Sacrococcygeal disorders, not elsewhere classified: Secondary | ICD-10-CM | POA: Diagnosis not present

## 2015-04-03 DIAGNOSIS — M19012 Primary osteoarthritis, left shoulder: Secondary | ICD-10-CM

## 2015-04-03 DIAGNOSIS — M25419 Effusion, unspecified shoulder: Secondary | ICD-10-CM | POA: Diagnosis not present

## 2015-04-03 DIAGNOSIS — M19019 Primary osteoarthritis, unspecified shoulder: Secondary | ICD-10-CM | POA: Diagnosis not present

## 2015-04-03 DIAGNOSIS — M7060 Trochanteric bursitis, unspecified hip: Secondary | ICD-10-CM | POA: Diagnosis not present

## 2015-04-03 DIAGNOSIS — M19011 Primary osteoarthritis, right shoulder: Secondary | ICD-10-CM

## 2015-04-03 DIAGNOSIS — S92901A Unspecified fracture of right foot, initial encounter for closed fracture: Secondary | ICD-10-CM

## 2015-04-03 DIAGNOSIS — M5416 Radiculopathy, lumbar region: Secondary | ICD-10-CM | POA: Diagnosis not present

## 2015-04-03 DIAGNOSIS — G588 Other specified mononeuropathies: Secondary | ICD-10-CM

## 2015-04-03 DIAGNOSIS — M25519 Pain in unspecified shoulder: Secondary | ICD-10-CM | POA: Diagnosis present

## 2015-04-03 DIAGNOSIS — M47816 Spondylosis without myelopathy or radiculopathy, lumbar region: Secondary | ICD-10-CM

## 2015-04-03 DIAGNOSIS — M797 Fibromyalgia: Secondary | ICD-10-CM

## 2015-04-03 MED ORDER — HYDROCODONE-ACETAMINOPHEN 5-325 MG PO TABS: ORAL_TABLET | ORAL | Status: DC

## 2015-04-03 MED ORDER — DIAZEPAM 5 MG PO TABS: ORAL_TABLET | ORAL | Status: DC

## 2015-04-03 NOTE — Progress Notes (Signed)
Subjective:    Patient ID: Carrie Myers, female    DOB: April 11, 1945, 70 y.o.   MRN: [id number]  HPI  The patient is a 69 year old female who returns to pain management Center for further evaluation and treatment of pain. The patient has had significant pain involving the region of the shoulder and has had exacerbation of pain of the lower back lower extremity region. The patient states that she was pushing her grandchildren and shopping cart and had exacerbation of pain of the lower back lower extremity region and shoulder. We reviewed MRI findings of the shoulder and following evaluation of patient's lumbar lower extremity pain noted patient to be with significant pain occurring in the region of the sacroiliac joint and the greater trochanteric iliotibial band region. We will discussed patient's condition and decision was made to proceed with interventional treatment at time return appointment in attempt to decrease severity disabling symptoms. We have discussed patient undergoing follow-up surgical evaluation and patient will proceed with further surgical evaluation of the shoulder as discussed. The patient was with understanding and in agreement with suggested treatment plan.  Review of Systems     Objective:   Physical Exam   There was tends to palpation of paraspinal musculature region of mild degree with mild tenderness of the splenius capitis and occipitalis musculature region. Palpation of the acromioclavicular and glenohumeral joint regions reproduce moderate to moderately severe discomfort. There was severe tenderness to palpation on the right acromioclavicular and glenohumeral joint region with limited range of motion of the right shoulder compared to the left shoulder with compression a decreased grip strength on the right compared to the left and unremarkable Spurling's maneuver. There was tenderness of the trapezius levator scapula rhomboid musculature region left greater than the  right. No crepitus of the thoracic region was noted. Phalen's maneuver were without increased pain of significant degree. Palpation over the thoracic facet thoracic paraspinal musculature region was evidence of muscle spasms in the lower thoracic region on the left as well as on the right. Palpation over the lumbar paraspinal must reason lumbar facet region was with increased pain with lateral bending rotation extension and palpation of the lumbar facets reproducing mild to moderate discomfort. There was moderate to moderately severe tenderness to palpation in the PSIS and PII S regions. There was moderate to moderately severe tenderness to palpation of the greater trochanteric region iliotibial band region. Straight leg raise was tolerates approximately 30 without increased pain with dorsiflexion noted. DTRs were difficult to elicit patient had difficulty relaxing. No sensory deficit or dermatomal dystrophy detected. Negative clonus negative Homans. Abdomen nontender with no costovertebral maintenance noted.     Assessment & Plan:   Sacroiliac joint dysfunction  Degenerative disc disease lumbar spine  Lumbar facet syndrome  Degenerative joint disease of shoulder 1. Mild to moderate supraspinatus tendinopathy and small shoulder joint effusion. 2. Moderate degenerative glenohumeral arthropathy and mild degenerative AC joint arthropathy.  Degenerative joint disease of shoulder  Sciatica (history of)  Greater trochanteric bursitis    PLAN   Continue present medication diazepam and hydrocodone acetaminophen   Block of nerves to the sacroiliac joint to be performed at time return appointment  F/U PCP Dr.Berglund  for evaliation of  BP and general medical  condition  F/U surgical evaluation. Evaluation of shoulder as discussed  Ask nurses and receptionist the date of your appointment with Dr.Poggi for evaluation of shoulder   F/U neurological evaluation. May consider pending  follow-up evaluations  May consider  radiofrequency rhizolysis or intraspinal procedures pending response to present treatment and F/U evaluation   Patient to call Pain Management Center should patient have concerns prior to scheduled return appointment Fibromyalgia

## 2015-04-03 NOTE — Progress Notes (Signed)
Safety precautions to be maintained throughout the outpatient stay will include: orient to surroundings, keep bed in low position, maintain call bell within reach at all times, provide assistance with transfer out of bed and ambulation.  

## 2015-04-03 NOTE — Patient Instructions (Addendum)
Continue present medication diazepam and hydrocodone acetaminophen   Block of nerves to the sacroiliac joint to be performed at time return appointment  F/U PCP Dr.Berglund  for evaliation of  BP and general medical  condition  F/U surgical evaluation. Evaluation of shoulder as discussed  Ask nurses and receptionist the date of your appointment with Dr.Poggi for evaluation of shoulder   F/U neurological evaluation. May consider pending follow-up evaluations  May consider radiofrequency rhizolysis or intraspinal procedures pending response to present treatment and F/U evaluation   Patient to call Pain Management Center should patient have concerns prior to scheduled return appointment.Pain Management Discharge Instructions  General Discharge Instructions :  If you need to reach your doctor call: Monday-Friday 8:00 am - 4:00 pm at [phone] or toll free [phone].  After clinic hours [phone] to have operator reach doctor.  Bring all of your medication bottles to all your appointments in the pain clinic.  To cancel or reschedule your appointment with Pain Management please remember to call 24 hours in advance to avoid a fee.  Refer to the educational materials which you have been given on: General Risks, I had my Procedure. Discharge Instructions, Post Sedation.  Post Procedure Instructions:  The drugs you were given will stay in your system until tomorrow, so for the next 24 hours you should not drive, make any legal decisions or drink any alcoholic beverages.  You may eat anything you prefer, but it is better to start with liquids then soups and crackers, and gradually work up to solid foods.  Please notify your doctor immediately if you have any unusual bleeding, trouble breathing or pain that is not related to your normal pain.  Depending on the type of procedure that was done, some parts of your body may feel week and/or numb.  This usually clears up by tonight or  the next day.  Walk with the use of an assistive device or accompanied by an adult for the 24 hours.  You may use ice on the affected area for the first 24 hours.  Put ice in a Ziploc bag and cover with a towel and place against area 15 minutes on 15 minutes off.  You may switch to heat after 24 hours.Sacroiliac (SI) Joint Injection Patient Information  Description: The sacroiliac joint connects the scrum (very low back and tailbone) to the ilium (a pelvic bone which also forms half of the hip joint).  Normally this joint experiences very little motion.  When this joint becomes inflamed or unstable low back and or hip and pelvis pain may result.  Injection of this joint with local anesthetics (numbing medicines) and steroids can provide diagnostic information and reduce pain.  This injection is performed with the aid of x-ray guidance into the tailbone area while you are lying on your stomach.   You may experience an electrical sensation down the leg while this is being done.  You may also experience numbness.  We also may ask if we are reproducing your normal pain during the injection.  Conditions which may be treated SI injection:   Low back, buttock, hip or leg pain  Preparation for the Injection:  1. Do not eat any solid food or dairy products within 6 hours of your appointment.  2. You may drink clear liquids up to 2 hours before appointment.  Clear liquids include water, black coffee, juice or soda.  No milk or cream please. 3. You may take your regular medications, including pain medications with  a sip of water before your appointment.  Diabetics should hold regular insulin (if take separately) and take 1/2 normal NPH dose the morning of the procedure.  Carry some sugar containing items with you to your appointment. 4. A driver must accompany you and be prepared to drive you home after your procedure. 5. Bring all of your current medications with you. 6. An IV may be inserted and  sedation may be given at the discretion of the physician. 7. A blood pressure cuff, EKG and other monitors will often be applied during the procedure.  Some patients may need to have extra oxygen administered for a short period.  8. You will be asked to provide medical information, including your allergies, prior to the procedure.  We must know immediately if you are taking blood thinners (like Coumadin/Warfarin) or if you are allergic to IV iodine contrast (dye).  We must know if you could possible be pregnant.  Possible side effects:   Bleeding from needle site  Infection (rare, may require surgery)  Nerve injury (rare)  Numbness & tingling (temporary)  A brief convulsion or seizure  Light-headedness (temporary)  Pain at injection site (several days)  Decreased blood pressure (temporary)  Weakness in the leg (temporary)   Call if you experience:   New onset weakness or numbness of an extremity below the injection site that last more than 8 hours.  Hives or difficulty breathing ( go to the emergency room)  Inflammation or drainage at the injection site  Any new symptoms which are concerning to you  Please note:  Although the local anesthetic injected can often make your back/ hip/ buttock/ leg feel good for several hours after the injections, the pain will likely return.  It takes 3-7 days for steroids to work in the sacroiliac area.  You may not notice any pain relief for at least that one week.  If effective, we will often do a series of three injections spaced 3-6 weeks apart to maximally decrease your pain.  After the initial series, we generally will wait some months before a repeat injection of the same type.  If you have any questions, please call [phone] [place]  What are the risk, side effects and possible complications? Generally speaking, most procedures are safe.  However, with  any procedure there are risks, side effects, and the possibility of complications.  The risks and complications are dependent upon the sites that are lesioned, or the type of nerve block to be performed.  The closer the procedure is to the spine, the more serious the risks are.  Great care is taken when placing the radio frequency needles, block needles or lesioning probes, but sometimes complications can occur. 1. Infection: Any time there is an injection through the skin, there is a risk of infection.  This is why sterile conditions are used for these blocks.  There are four possible types of infection. 1. Localized skin infection. 2. Central Nervous System Infection-This can be in the form of Meningitis, which can be deadly. 3. Epidural Infections-This can be in the form of an epidural abscess, which can cause pressure inside of the spine, causing compression of the spinal cord with subsequent paralysis. This would require an emergency surgery to decompress, and there are no guarantees that the patient would recover from the paralysis. 4. Discitis-This is an infection of the intervertebral discs.  It occurs in about 1% of discography procedures.  It is difficult to treat and it may lead to surgery.        2. Pain: the needles have to go through skin and soft tissues, will cause soreness.       3. Damage to internal structures:  The nerves to be lesioned may be near blood vessels or    other nerves which can be potentially damaged.       4. Bleeding: Bleeding is more common if the patient is taking blood thinners such as  aspirin, Coumadin, Ticiid, Plavix, etc., or if he/she have some genetic predisposition  such as hemophilia. Bleeding into the spinal canal can cause compression of the spinal  cord with subsequent paralysis.  This would require an emergency surgery to  decompress and there are no guarantees that the patient would recover from the  paralysis.       5. Pneumothorax:  Puncturing of a lung  is a possibility, every time a needle is introduced in  the area of the chest or upper back.  Pneumothorax refers to free air around the  collapsed lung(s), inside of the thoracic cavity (chest cavity).  Another two possible  complications related to a similar event would include: Hemothorax and Chylothorax.   These are variations of the Pneumothorax, where instead of air around the collapsed  lung(s), you may have blood or chyle, respectively.       6. Spinal headaches: They may occur with any procedures in the area of the spine.       7. Persistent CSF (Cerebro-Spinal Fluid) leakage: This is a rare problem, but may occur  with prolonged intrathecal or epidural catheters either due to the formation of a fistulous  track or a dural tear.       8. Nerve damage: By working so close to the spinal cord, there is always a possibility of  nerve damage, which could be as serious as a permanent spinal cord injury with  paralysis.       9. Death:  Although rare, severe deadly allergic reactions known as "Anaphylactic  reaction" can occur to any of the medications used.      10. Worsening of the symptoms:  We can always make thing worse.  What are the chances of something like this happening? Chances of any of this occuring are extremely low.  By statistics, you have more of a chance of getting killed in a motor vehicle accident: while driving to the hospital than any of the above occurring .  Nevertheless, you should be aware that they are possibilities.  In general, it is similar to taking a shower.  Everybody knows that you can slip, hit your head and get killed.  Does that mean that you should not shower again?  Nevertheless always keep in mind that statistics do not mean anything if you happen to be on the wrong side of them.  Even if a procedure has a 1 (one) in a 1,000,000 (million) chance of going wrong, it you happen to be that one..Also, keep in mind that by statistics, you have more of a chance of having  something go wrong when taking medications.  Who should not have this procedure? If you are on a blood thinning medication (e.g. Coumadin, Plavix, see list of "Blood Thinners"), or if you have an active infection going on, you should not have the procedure.  If you are taking any blood thinners, please inform your physician.  How should I prepare for this procedure?  Do not eat or drink anything at least six hours prior to the procedure.  Bring a driver with you .  It cannot be a taxi.  Come accompanied by an adult that can drive you back, and that is strong enough to help you if your legs get weak or numb from the local anesthetic.  Take all of your medicines the morning of the procedure with just enough water to swallow them.  If you have diabetes, make sure that you are scheduled to have your procedure done first thing in the morning, whenever possible.  If you have diabetes, take only half of your insulin dose and notify our nurse that you have done so as soon as you arrive at the clinic.  If you are diabetic, but only take blood sugar pills (oral hypoglycemic), then do not take them on the morning of your procedure.  You may take them after you have had the procedure.  Do not take aspirin or any aspirin-containing medications, at least eleven (11) days prior to the procedure.  They may prolong bleeding.  Wear loose fitting clothing that may be easy to take off and that you would not mind if it got stained with Betadine or blood.  Do not wear any jewelry or perfume  Remove any nail coloring.  It will interfere with some of our monitoring equipment.  NOTE: Remember that this is not meant to be interpreted as a complete list of all possible complications.  Unforeseen problems may occur.  BLOOD THINNERS The following drugs contain aspirin or other products, which can cause increased bleeding during surgery and should not be taken for 2 weeks prior to and 1 week after surgery.  If you  should need take something for relief of minor pain, you may take acetaminophen which is found in Tylenol,m Datril, Anacin-3 and Panadol. It is not blood thinner. The products listed below are.  Do not take any of the products listed below in addition to any listed on your instruction sheet.  A.P.C or A.P.C with Codeine Codeine Phosphate Capsules #3 Ibuprofen Ridaura  ABC compound Congesprin Imuran rimadil  Advil Cope Indocin Robaxisal  Alka-Seltzer Effervescent Pain Reliever and Antacid Coricidin or Coricidin-D  Indomethacin Rufen  Alka-Seltzer plus Cold Medicine Cosprin Ketoprofen S-A-C Tablets  Anacin Analgesic Tablets or Capsules Coumadin Korlgesic Salflex  Anacin Extra Strength Analgesic tablets or capsules CP-2 Tablets Lanoril Salicylate  Anaprox Cuprimine Capsules Levenox Salocol  Anexsia-D Dalteparin Magan Salsalate  Anodynos Darvon compound Magnesium Salicylate Sine-off  Ansaid Dasin Capsules Magsal Sodium Salicylate  Anturane Depen Capsules Marnal Soma  APF Arthritis pain formula Dewitt's Pills Measurin Stanback  Argesic Dia-Gesic Meclofenamic Sulfinpyrazone  Arthritis Bayer Timed Release Aspirin Diclofenac Meclomen Sulindac  Arthritis pain formula Anacin Dicumarol Medipren Supac  Analgesic (Safety coated) Arthralgen Diffunasal Mefanamic Suprofen  Arthritis Strength Bufferin Dihydrocodeine Mepro Compound Suprol  Arthropan liquid Dopirydamole Methcarbomol with Aspirin Synalgos  ASA tablets/Enseals Disalcid Micrainin Tagament  Ascriptin Doan's Midol Talwin  Ascriptin A/D Dolene Mobidin Tanderil  Ascriptin Extra Strength Dolobid Moblgesic Ticlid  Ascriptin with Codeine Doloprin or Doloprin with Codeine Momentum Tolectin  Asperbuf Duoprin Mono-gesic Trendar  Aspergum Duradyne Motrin or Motrin IB Triminicin  Aspirin plain, buffered or enteric coated Durasal Myochrisine Trigesic  Aspirin Suppositories Easprin Nalfon Trillsate  Aspirin with Codeine Ecotrin Regular or Extra Strength  Naprosyn Uracel  Atromid-S Efficin Naproxen Ursinus  Auranofin Capsules Elmiron Neocylate Vanquish  Axotal Emagrin Norgesic Verin  Azathioprine Empirin or Empirin with Codeine Normiflo Vitamin E  Azolid Emprazil Nuprin Voltaren  Bayer Aspirin plain, buffered or children's or timed BC Tablets or powders Encaprin Orgaran Warfarin Sodium  Buff-a-Comp Enoxaparin Orudis Zorpin  Buff-a-Comp with Codeine Equegesic Os-Cal-Gesic   Buffaprin Excedrin plain, buffered or Extra Strength Oxalid   Bufferin Arthritis Strength Feldene Oxphenbutazone   Bufferin plain or Extra Strength Feldene Capsules Oxycodone with Aspirin   Bufferin with Codeine Fenoprofen Fenoprofen Pabalate or Pabalate-SF   Buffets II Flogesic Panagesic   Buffinol plain or Extra Strength Florinal or Florinal with Codeine Panwarfarin   Buf-Tabs Flurbiprofen Penicillamine   Butalbital Compound Four-way cold tablets Penicillin   Butazolidin Fragmin Pepto-Bismol   Carbenicillin Geminisyn Percodan   Carna Arthritis Reliever Geopen Persantine   Carprofen Gold's salt Persistin   Chloramphenicol Goody's Phenylbutazone   Chloromycetin Haltrain Piroxlcam   Clmetidine heparin Plaquenil   Cllnoril Hyco-pap Ponstel   Clofibrate Hydroxy chloroquine Propoxyphen         Before stopping any of these medications, be sure to consult the physician who ordered them.  Some, such as Coumadin (Warfarin) are ordered to prevent or treat serious conditions such as "deep thrombosis", "pumonary embolisms", and other heart problems.  The amount of time that you may need off of the medication may also vary with the medication and the reason for which you were taking it.  If you are taking any of these medications, please make sure you notify your pain physician before you undergo any procedures.

## 2015-04-07 ENCOUNTER — Encounter: Payer: Self-pay | Admitting: Pain Medicine

## 2015-04-07 ENCOUNTER — Ambulatory Visit: Payer: Medicare Other | Attending: Pain Medicine | Admitting: Pain Medicine

## 2015-04-07 VITALS — BP 157/103 | HR 81 | Temp 97.9°F | Resp 16 | Ht 68.0 in | Wt 180.0 lb

## 2015-04-07 DIAGNOSIS — M5030 Other cervical disc degeneration, unspecified cervical region: Secondary | ICD-10-CM

## 2015-04-07 DIAGNOSIS — M79606 Pain in leg, unspecified: Secondary | ICD-10-CM | POA: Diagnosis present

## 2015-04-07 DIAGNOSIS — M5481 Occipital neuralgia: Secondary | ICD-10-CM

## 2015-04-07 DIAGNOSIS — M47816 Spondylosis without myelopathy or radiculopathy, lumbar region: Secondary | ICD-10-CM

## 2015-04-07 DIAGNOSIS — M533 Sacrococcygeal disorders, not elsewhere classified: Secondary | ICD-10-CM | POA: Diagnosis not present

## 2015-04-07 DIAGNOSIS — M47817 Spondylosis without myelopathy or radiculopathy, lumbosacral region: Secondary | ICD-10-CM | POA: Diagnosis not present

## 2015-04-07 DIAGNOSIS — M545 Low back pain: Secondary | ICD-10-CM | POA: Insufficient documentation

## 2015-04-07 DIAGNOSIS — G588 Other specified mononeuropathies: Secondary | ICD-10-CM

## 2015-04-07 DIAGNOSIS — M19011 Primary osteoarthritis, right shoulder: Secondary | ICD-10-CM | POA: Diagnosis not present

## 2015-04-07 DIAGNOSIS — M19012 Primary osteoarthritis, left shoulder: Secondary | ICD-10-CM

## 2015-04-07 DIAGNOSIS — M797 Fibromyalgia: Secondary | ICD-10-CM

## 2015-04-07 DIAGNOSIS — S92901A Unspecified fracture of right foot, initial encounter for closed fracture: Secondary | ICD-10-CM

## 2015-04-07 DIAGNOSIS — M5136 Other intervertebral disc degeneration, lumbar region: Secondary | ICD-10-CM | POA: Diagnosis not present

## 2015-04-07 DIAGNOSIS — M5416 Radiculopathy, lumbar region: Secondary | ICD-10-CM | POA: Diagnosis not present

## 2015-04-07 MED ORDER — ORPHENADRINE CITRATE 30 MG/ML IJ SOLN: 60.0000 mg | Freq: Once | INTRAMUSCULAR | Status: DC

## 2015-04-07 MED ORDER — BUPIVACAINE HCL (PF) 0.25 % IJ SOLN: 30.0000 mL | Freq: Once | INTRAMUSCULAR | Status: DC

## 2015-04-07 MED ORDER — FENTANYL CITRATE (PF) 100 MCG/2ML IJ SOLN
INTRAMUSCULAR | Status: AC
  Administered 2015-04-07: 100 ug via INTRAVENOUS
  Filled 2015-04-07: qty 2

## 2015-04-07 MED ORDER — MIDAZOLAM HCL 5 MG/5ML IJ SOLN: 5.0000 mg | Freq: Once | INTRAMUSCULAR | Status: DC

## 2015-04-07 MED ORDER — BUPIVACAINE HCL (PF) 0.25 % IJ SOLN
INTRAMUSCULAR | Status: AC
  Administered 2015-04-07: 12:00:00
  Filled 2015-04-07: qty 30

## 2015-04-07 MED ORDER — FENTANYL CITRATE (PF) 100 MCG/2ML IJ SOLN: 100.0000 ug | Freq: Once | INTRAMUSCULAR | Status: DC

## 2015-04-07 MED ORDER — SODIUM CHLORIDE 0.9 % IJ SOLN: 20.0000 mL | Freq: Once | INTRAMUSCULAR | Status: DC

## 2015-04-07 MED ORDER — DOXYCYCLINE HYCLATE 100 MG PO TABS: 100.0000 mg | ORAL_TABLET | Freq: Two times a day (BID) | ORAL | Status: DC

## 2015-04-07 MED ORDER — ORPHENADRINE CITRATE 30 MG/ML IJ SOLN
INTRAMUSCULAR | Status: AC
  Administered 2015-04-07: 12:00:00
  Filled 2015-04-07: qty 2

## 2015-04-07 MED ORDER — LACTATED RINGERS IV SOLN
1000.0000 mL | INTRAVENOUS | Status: DC
Start: 2015-04-07 — End: 2015-05-02

## 2015-04-07 MED ORDER — TRIAMCINOLONE ACETONIDE 40 MG/ML IJ SUSP
INTRAMUSCULAR | Status: AC
  Administered 2015-04-07: 12:00:00
  Filled 2015-04-07: qty 1

## 2015-04-07 MED ORDER — TRIAMCINOLONE ACETONIDE 40 MG/ML IJ SUSP: 40.0000 mg | Freq: Once | INTRAMUSCULAR | Status: DC

## 2015-04-07 MED ORDER — MIDAZOLAM HCL 5 MG/5ML IJ SOLN
INTRAMUSCULAR | Status: AC
  Administered 2015-04-07: 5 mg via INTRAVENOUS
  Filled 2015-04-07: qty 5

## 2015-04-07 MED ORDER — SODIUM CHLORIDE 0.9 % IJ SOLN
INTRAMUSCULAR | Status: AC
  Administered 2015-04-07: 12:00:00
  Filled 2015-04-07: qty 10

## 2015-04-07 NOTE — Progress Notes (Signed)
Safety precautions to be maintained throughout the outpatient stay will include: orient to surroundings, keep bed in low position, maintain call bell within reach at all times, provide assistance with transfer out of bed and ambulation.  

## 2015-04-07 NOTE — Progress Notes (Signed)
Subjective:    Patient ID: Carrie Myers, female    DOB: [birth year], 70 y.o.   MRN: [id number]  HPI PROCEDURE:  Block of nerves to the sacroiliac joint.   NOTE:  The patient is a 70 y.o. female who returns to the [facility] for further evaluation and treatment of pain involving the lower back and lower extremity region with pain in the region of the buttocks as well. Prior  MRI studies reveal Degenerative disc disease lumbar spine. The patient has severe tnederness of the PSIS and PIIS regions.   There is concern regarding a significant component of the patient's pain being due to sacroiliac joint dysfunction The risks, benefits, expectations of the procedure have been discussed and explained to the patient who is understanding and willing to proceed with interventional treatment in attempt to decrease severity of patient's symptoms, minimize the risk of medication escalation and  hopefully retard the progression of the patient's symptoms. We will proceed with what is felt to be a medically necessary procedure, block of nerves to the sacroiliac joint.   DESCRIPTION OF PROCEDURE:  Block of nerves to the sacroiliac joint.   The patient was taken to the fluoroscopy suite. With the patient in the prone position with EKG, blood pressure, pulse and pulse oximetry monitoring, IV Versed, IV fentanyl conscious sedation, Betadine prep of proposed entry site was performed.   Block of nerves at the L5 vertebral body level.   With the patient in prone position, under fluoroscopic guidance, a 22 -gauge needle was inserted at the L5 vertebral body level on the left side. With 15 degrees oblique orientation a 22 -gauge needle was inserted in the region known as Burton's eye or eye of the Scotty dog. Following documentation of needle placement in the area of Burton's eye or eye of the Scotty dog under fluoroscopic guidance, needle placement was then accomplished at the sacral ala level on the   lleftside.   Needle placement at the sacral ala.   With the patient in prone position under fluoroscopic guidance with AP view of the lumbosacral spine, a 22 -gauge needle was inserted in the region known as the sacral ala on the left side. Following documentation of needle placement on the  left side under fluoroscopic guidance needle placement was then accomplished at the S1 foramen level.   Needle placement at the S1 foramen level.   With the patient in prone position under fluoroscopic guidance with AP view of the lumbosacral spine and cephalad orientation, a 22 -gauge needle was inserted at the superior and lateral border of the S1 foramen on the  left side. Following documentation of needle placement at the S1 foramen level on the left side, needle placement was then accomplished at the S2 foramen level on the  Left  side.   Needle placement at the S2 foramen level.   With the patient in prone position with AP view of the lumbosacral spine with cephalad orientation, a 22 - gauge needle was inserted at the superior and lateral border of the S2 foramen under fluoroscopic guidance on the  Left side. Following needle placement at the L5 vertebral body level, sacral ala, S1 foramen and S2 foramen on the left side, needle placement was verified on lateral view under fluoroscopic guidance.  Following needle placement documentation on lateral view, each needle was injected with 1 mL of preservative-free normal saline and Kenalog.   BLOCK OF THE NERVES TO SACROILIAC JOINT ON THE RIGHT SIDE The  procedure was performed on the right side at the same levels as was performed on the left side and utilizing the same technique as on the left side and was performed under fluoroscopic guidance as on the left side  Myoneural block injections of the gluteal musculature region Following Betadine prep of proposed entry site a 22-gauge needle was inserted in the gluteal musculature region and following negative  aspiration 2 cc of 0.25% bupivacaine with Norflex was injected for myoneural block injection of the gluteal musculature region 4  The patient tolerated procedure well    A total of 10mg  of Kenalog was utilized for the procedure.   PLAN:  1. Medications: The patient will continue presently prescribed medications. Hydrocodone acetaminophen and Valium  2. The patient will be considered for modification of treatment regimen pending response to the procedure performed on today's visit.  3. The patient is to follow-up with primary care physician Dr.Berglund  for evaluation of blood pressure and general medical condition following the procedure performed on today's visit.  4. Surgical evaluation as discussed.  5. Neurological evaluation as discussed.  6. The patient may be a candidate for radiofrequency procedures, implantation devices and other treatment pending response to treatment performed on today's visit and follow-up evaluation.  7. The patient has been advised to adhere to proper body mechanics and to avoid activities which may exacerbate the patient's symptoms.   Return appointment to [facility] as scheduled.     Review of Systems     Objective:   Physical Exam        Assessment & Plan:

## 2015-04-07 NOTE — Patient Instructions (Addendum)
PLAN  Continue present medication Valium and hydrocodone acetaminophen and begin taking antibiotic doxycycline as prescribed. Please obtain your antibiotic today and begin taking antibiotic today  F/U PCP Dr.Berglund  for evaliation of  BP and general medical  condition.  F/U surgical evaluation. May consider pending follow-up evaluations  F/U neurological evaluation. May consider pending follow-up evaluations  May consider radiofrequency rhizolysis or intraspinal procedures pending response to present treatment and F/U evaluation.  Patient to call Pain Management Center should patient have concerns prior to scheduled return appointment. Selective Nerve Root Block Patient Information  Description: Specific nerve roots exit the spinal canal and these nerves can be compressed and inflamed by a bulging disc and bone spurs.  By injecting steroids on the nerve root, we can potentially decrease the inflammation surrounding these nerves, which often leads to decreased pain.  Also, by injecting local anesthesia on the nerve root, this can provide [place] helpful information to give to your referring doctor if it decreases your pain.  Selective nerve root blocks can be done along the spine from the neck to the low back depending on the location of your pain.   After numbing the skin with local anesthesia, a small needle is passed to the nerve root and the position of the needle is verified using x-ray pictures.  After the needle is in correct position, we then deposit the medication.  You may experience a pressure sensation while this is being done.  The entire block usually lasts less than 15 minutes.  Conditions that may be treated with selective nerve root blocks:  Low back and leg pain  Spinal stenosis  Diagnostic block prior to potential surgery  Neck and arm pain  Post laminectomy syndrome  Preparation for the injection:  1. Do not eat any solid food or dairy products within 6 hours of your  appointment. 2. You may drink clear liquids up to 2 hours before an appointment.  Clear liquids include water, black coffee, juice or soda.  No milk or cream please. 3. You may take your regular medications, including pain medications, with a sip of water before your appointment.  Diabetics should hold regular insulin (if taken separately) and take 1/2 normal NPH dose the morning of the procedure.  Carry some sugar containing items with you to your appointment. 4. A driver must accompany you and be prepared to drive you home after your procedure. 5. Bring all your current medications with you. 6. An IV may be inserted and sedation may be given at the discretion of the physician. 7. A blood pressure cuff, EKG, and other monitors will often be applied during the procedure.  Some patients may need to have extra oxygen administered for a short period. 8. You will be asked to provide medical information, including allergies, prior to the procedure.  We must know immediately if you are taking blood  Thinners (like Coumadin) or if you are allergic to IV iodine contrast (dye).  Possible side-effects: All are usually temporary  Bleeding from needle site  Light headedness  Numbness and tingling  Decreased blood pressure  Weakness in arms/legs  Pressure sensation in back/neck  Pain at injection site (several days)  Possible complications: All are extremely rare  Infection  Nerve injury  Spinal headache (a headache wore with upright position)  Call if you experience:  Fever/chills associated with headache or increased back/neck pain  Headache worsened by an upright position  New onset weakness or numbness of an extremity below the injection  site  Hives or difficulty breathing (go to the emergency room)  Inflammation or drainage at the injection site(s)  Severe back/neck pain greater than usual  New symptoms which are concerning to you  Please note:  Although the local  anesthetic injected can often make your back or neck feel good for several hours after the injection the pain will likely return.  It takes 3-5 days for steroids to work on the nerve root. You may not notice any pain relief for at least one week.  If effective, we will often do a series of 3 injections spaced 3-6 weeks apart to maximally decrease your pain.    If you have any questions, please call [phone] [facility] Discharge Inst[facility]  General Discharge Instructions :  If you need to reach your doctor call: Monday-Friday 8:00 am - 4:00 pm at [phone] or toll free [phone].  After clinic hours [phone] to have operator reach doctor.  Bring all of your medication bottles to all your appointments in the pain clinic.  To cancel or reschedule your appointment with Pain Management please remember to call 24 hours in advance to avoid a fee.  Refer to the educational materials which you have been given on: General Risks, I had my Procedure. Discharge Instructions, Post Sedation.  Post Procedure Instructions:  The drugs you were given will stay in your system until tomorrow, so for the next 24 hours you should not drive, make any legal decisions or drink any alcoholic beverages.  You may eat anything you prefer, but it is better to start with liquids then soups and crackers, and gradually work up to solid foods.  Please notify your doctor immediately if you have any unusual bleeding, trouble breathing or pain that is not related to your normal pain.  Depending on the type of procedure that was done, some parts of your body may feel week and/or numb.  This usually clears up by tonight or the next day.  Walk with the use of an assistive device or accompanied by an adult for the 24 hours.  You may use ice on the affected area for the first 24 hours.  Put ice in a Ziploc bag and cover with a towel and place against area 15  minutes on 15 minutes off.  You may switch to heat after 24 hours.

## 2015-04-08 ENCOUNTER — Telehealth: Payer: Self-pay | Admitting: *Deleted

## 2015-04-08 NOTE — Telephone Encounter (Signed)
"  call cannot be completed as dialed"

## 2015-04-17 DIAGNOSIS — M7541 Impingement syndrome of right shoulder: Secondary | ICD-10-CM | POA: Insufficient documentation

## 2015-04-17 DIAGNOSIS — M7581 Other shoulder lesions, right shoulder: Secondary | ICD-10-CM

## 2015-04-17 HISTORY — DX: Other shoulder lesions, right shoulder: M75.81

## 2015-04-30 ENCOUNTER — Encounter: Payer: Self-pay | Admitting: Pain Medicine

## 2015-04-30 ENCOUNTER — Ambulatory Visit: Payer: Medicare Other | Attending: Pain Medicine | Admitting: Pain Medicine

## 2015-04-30 VITALS — BP 132/70 | HR 81 | Temp 98.0°F | Resp 17 | Ht 68.0 in | Wt 180.0 lb

## 2015-04-30 DIAGNOSIS — M79606 Pain in leg, unspecified: Secondary | ICD-10-CM | POA: Diagnosis present

## 2015-04-30 DIAGNOSIS — M25419 Effusion, unspecified shoulder: Secondary | ICD-10-CM | POA: Insufficient documentation

## 2015-04-30 DIAGNOSIS — M7060 Trochanteric bursitis, unspecified hip: Secondary | ICD-10-CM | POA: Diagnosis not present

## 2015-04-30 DIAGNOSIS — M1990 Unspecified osteoarthritis, unspecified site: Secondary | ICD-10-CM

## 2015-04-30 DIAGNOSIS — M47817 Spondylosis without myelopathy or radiculopathy, lumbosacral region: Secondary | ICD-10-CM | POA: Diagnosis not present

## 2015-04-30 DIAGNOSIS — M5416 Radiculopathy, lumbar region: Secondary | ICD-10-CM | POA: Diagnosis not present

## 2015-04-30 DIAGNOSIS — G588 Other specified mononeuropathies: Secondary | ICD-10-CM

## 2015-04-30 DIAGNOSIS — S92901A Unspecified fracture of right foot, initial encounter for closed fracture: Secondary | ICD-10-CM

## 2015-04-30 DIAGNOSIS — M5030 Other cervical disc degeneration, unspecified cervical region: Secondary | ICD-10-CM

## 2015-04-30 DIAGNOSIS — M5136 Other intervertebral disc degeneration, lumbar region: Secondary | ICD-10-CM | POA: Diagnosis not present

## 2015-04-30 DIAGNOSIS — M533 Sacrococcygeal disorders, not elsewhere classified: Secondary | ICD-10-CM | POA: Diagnosis not present

## 2015-04-30 DIAGNOSIS — M5481 Occipital neuralgia: Secondary | ICD-10-CM

## 2015-04-30 DIAGNOSIS — M19019 Primary osteoarthritis, unspecified shoulder: Secondary | ICD-10-CM | POA: Insufficient documentation

## 2015-04-30 DIAGNOSIS — M7580 Other shoulder lesions, unspecified shoulder: Secondary | ICD-10-CM | POA: Diagnosis not present

## 2015-04-30 DIAGNOSIS — M19012 Primary osteoarthritis, left shoulder: Secondary | ICD-10-CM

## 2015-04-30 DIAGNOSIS — M47816 Spondylosis without myelopathy or radiculopathy, lumbar region: Secondary | ICD-10-CM

## 2015-04-30 DIAGNOSIS — M797 Fibromyalgia: Secondary | ICD-10-CM

## 2015-04-30 DIAGNOSIS — M19011 Primary osteoarthritis, right shoulder: Secondary | ICD-10-CM

## 2015-04-30 DIAGNOSIS — M545 Low back pain: Secondary | ICD-10-CM | POA: Diagnosis present

## 2015-04-30 DIAGNOSIS — M791 Myalgia: Secondary | ICD-10-CM | POA: Diagnosis not present

## 2015-04-30 MED ORDER — HYDROCODONE-ACETAMINOPHEN 5-325 MG PO TABS: ORAL_TABLET | ORAL | Status: DC

## 2015-04-30 MED ORDER — DIAZEPAM 5 MG PO TABS: ORAL_TABLET | ORAL | Status: DC

## 2015-04-30 NOTE — Patient Instructions (Addendum)
PLAN  Continue present medication Valium and hydrocodone acetaminophen   F/U PCP Dr.Berglund  for evaliation of  BP and general medical  condition.  F/U surgical evaluation. Surgery of shoulder as planned with Dr. Roland Rack  F/U neurological evaluation. May consider pending follow-up evaluations  May consider radiofrequency rhizolysis or intraspinal procedures pending response to present treatment and F/U evaluation.  Patient to call Pain Management Center should patient have concerns prior to scheduled return appointment.

## 2015-04-30 NOTE — Progress Notes (Signed)
   Subjective:    Patient ID: Carrie Myers, female    DOB: [birth year], 70 y.o.   MRN: [id number]  HPI The patient is a 70 year old female who returns to pain management for further evaluation and treatment of pain involving the region of the lower back lower extremity region and region of the left shoulder. The patient states that the present time she is planning to undergo surgery of the shoulder with Dr.Poggi . We will avoid interventional treatment at this time and may consider patient for interventional treatment of pain of the lumbar lower extremity region was patient has recovered from her surgery of the shoulder. The patient will continue medications consisting of diazepam and hydrocodone acetaminophen at this time. The patient is to call pain management should there be significant change in condition prior to scheduled return appointment. The patient agreed to suggested treatment plan. The patient denied any recent trauma change in events of daily living the call significant change in symptomatology.    Review of Systems     Objective:   Physical Exam  Palpation of the splenius capitis and occipitalis musculature region reproduced pain of moderate degree. There was moderate tenderness to moderately severe tenderness of the acromioclavicular renal humeral joint region. The patient was with significantly decreased range of motion of the shoulder. Tinel and Phalen's maneuver were without increase of pain of significant degree.. The patient was with unremarkable Spurling's maneuver. The patient was with what appeared to be decreased grip strength on the left compared to the right. Palpation over the thoracic facet thoracic paraspinal must reason was associated with muscle spasms in the thoracic region. No crepitus of the thoracic region was noted. Palpation of the lumbar paraspinal must reason lumbar facet region was with moderate tenderness to palpation with lateral bending rotation extension  and palpation of the lumbar facets reproducing moderate discomfort on the right greater than on the left. There was tenderness of the PSIS and PII S region a mild to moderate degree. Palpation of the gluteal and piriformis muscular region associated with mild to moderate discomfort. There was tends to palpation of the greater trochanteric region and iliotibial band region of moderate degree as well. EHL strength appeared to be slightly decreased. Straight leg raise was tolerates approximately 30 without a definite increase of pain with dorsiflexion noted. There was negative clonus negative Homans. Abdomen nontender with no costovertebral angle tenderness noted      Assessment & Plan:    Sacroiliac joint dysfunction  Degenerative disc disease lumbar spine  Lumbar facet syndrome  Degenerative joint disease of shoulder 1. Mild to moderate supraspinatus tendinopathy and small shoulder joint effusion. 2. Moderate degenerative glenohumeral arthropathy and mild degenerative AC joint arthropathy.  Degenerative joint disease of shoulder  Sciatica (history of)  Greater trochanteric bursitis     PLAN  Continue present medication Valium and hydrocodone acetaminophen   F/U PCP Dr.Berglund  for evaliation of  BP and general medical  condition.  F/U surgical evaluation. Surgery of shoulder as planned with Dr. Roland Rack  F/U neurological evaluation. May consider pending follow-up evaluations  May consider radiofrequency rhizolysis or intraspinal procedures pending response to present treatment and F/U evaluation.  Patient to call Pain Management Center should patient have concerns prior to scheduled return appointment.

## 2015-04-30 NOTE — Progress Notes (Signed)
Safety precautions to be maintained throughout the outpatient stay will include: orient to surroundings, keep bed in low position, maintain call bell within reach at all times, provide assistance with transfer out of bed and ambulation.  

## 2015-05-02 ENCOUNTER — Encounter: Payer: Self-pay | Admitting: *Deleted

## 2015-05-05 ENCOUNTER — Encounter: Payer: Self-pay | Admitting: Anesthesiology

## 2015-05-07 ENCOUNTER — Encounter
Admission: RE | Disposition: A | Discharge status: Home / Self Care | Payer: Self-pay | Source: Ambulatory Visit | Attending: Surgery

## 2015-05-07 ENCOUNTER — Ambulatory Visit
Admission: RE | Admit: 2015-05-07 | Discharge: 2015-05-07 | Disposition: A | Discharge status: Home / Self Care | Payer: Medicare Other | Source: Ambulatory Visit | Attending: Surgery | Admitting: Surgery

## 2015-05-07 ENCOUNTER — Ambulatory Visit: Payer: Medicare Other | Admitting: Anesthesiology

## 2015-05-07 DIAGNOSIS — M19041 Primary osteoarthritis, right hand: Secondary | ICD-10-CM | POA: Diagnosis not present

## 2015-05-07 DIAGNOSIS — Z881 Allergy status to other antibiotic agents status: Secondary | ICD-10-CM | POA: Diagnosis not present

## 2015-05-07 DIAGNOSIS — M19011 Primary osteoarthritis, right shoulder: Secondary | ICD-10-CM | POA: Diagnosis not present

## 2015-05-07 DIAGNOSIS — M7541 Impingement syndrome of right shoulder: Secondary | ICD-10-CM | POA: Insufficient documentation

## 2015-05-07 DIAGNOSIS — Z79899 Other long term (current) drug therapy: Secondary | ICD-10-CM | POA: Insufficient documentation

## 2015-05-07 DIAGNOSIS — I10 Essential (primary) hypertension: Secondary | ICD-10-CM | POA: Diagnosis not present

## 2015-05-07 DIAGNOSIS — M4800 Spinal stenosis, site unspecified: Secondary | ICD-10-CM | POA: Insufficient documentation

## 2015-05-07 DIAGNOSIS — Z91013 Allergy to seafood: Secondary | ICD-10-CM | POA: Diagnosis not present

## 2015-05-07 DIAGNOSIS — M24111 Other articular cartilage disorders, right shoulder: Secondary | ICD-10-CM | POA: Diagnosis not present

## 2015-05-07 DIAGNOSIS — M94211 Chondromalacia, right shoulder: Secondary | ICD-10-CM | POA: Insufficient documentation

## 2015-05-07 DIAGNOSIS — Z87891 Personal history of nicotine dependence: Secondary | ICD-10-CM | POA: Diagnosis not present

## 2015-05-07 DIAGNOSIS — Z90710 Acquired absence of both cervix and uterus: Secondary | ICD-10-CM | POA: Diagnosis not present

## 2015-05-07 DIAGNOSIS — M7581 Other shoulder lesions, right shoulder: Secondary | ICD-10-CM | POA: Diagnosis not present

## 2015-05-07 DIAGNOSIS — Z8249 Family history of ischemic heart disease and other diseases of the circulatory system: Secondary | ICD-10-CM | POA: Diagnosis not present

## 2015-05-07 DIAGNOSIS — Z882 Allergy status to sulfonamides status: Secondary | ICD-10-CM | POA: Diagnosis not present

## 2015-05-07 DIAGNOSIS — I2510 Atherosclerotic heart disease of native coronary artery without angina pectoris: Secondary | ICD-10-CM | POA: Diagnosis not present

## 2015-05-07 DIAGNOSIS — Z823 Family history of stroke: Secondary | ICD-10-CM | POA: Insufficient documentation

## 2015-05-07 DIAGNOSIS — Z8261 Family history of arthritis: Secondary | ICD-10-CM | POA: Diagnosis not present

## 2015-05-07 DIAGNOSIS — Z885 Allergy status to narcotic agent status: Secondary | ICD-10-CM | POA: Insufficient documentation

## 2015-05-07 DIAGNOSIS — Z955 Presence of coronary angioplasty implant and graft: Secondary | ICD-10-CM | POA: Insufficient documentation

## 2015-05-07 DIAGNOSIS — Z7982 Long term (current) use of aspirin: Secondary | ICD-10-CM | POA: Diagnosis not present

## 2015-05-07 DIAGNOSIS — Z888 Allergy status to other drugs, medicaments and biological substances status: Secondary | ICD-10-CM | POA: Diagnosis not present

## 2015-05-07 DIAGNOSIS — M19071 Primary osteoarthritis, right ankle and foot: Secondary | ICD-10-CM | POA: Diagnosis not present

## 2015-05-07 DIAGNOSIS — M1990 Unspecified osteoarthritis, unspecified site: Secondary | ICD-10-CM | POA: Diagnosis present

## 2015-05-07 DIAGNOSIS — M797 Fibromyalgia: Secondary | ICD-10-CM | POA: Insufficient documentation

## 2015-05-07 DIAGNOSIS — M7521 Bicipital tendinitis, right shoulder: Secondary | ICD-10-CM | POA: Diagnosis not present

## 2015-05-07 DIAGNOSIS — E039 Hypothyroidism, unspecified: Secondary | ICD-10-CM | POA: Insufficient documentation

## 2015-05-07 DIAGNOSIS — I252 Old myocardial infarction: Secondary | ICD-10-CM | POA: Diagnosis not present

## 2015-05-07 HISTORY — PX: SHOULDER ARTHROSCOPY: SHX128

## 2015-05-07 HISTORY — DX: Dental restoration status: Z98.811

## 2015-05-07 HISTORY — DX: Anxiety disorder, unspecified: F41.9

## 2015-05-07 SURGERY — ARTHROSCOPY, SHOULDER
Anesthesia: Regional | Laterality: Right | Wound class: Clean

## 2015-05-07 MED ORDER — FENTANYL CITRATE (PF) 100 MCG/2ML IJ SOLN: 50.0000 ug | INTRAMUSCULAR | Status: DC | PRN

## 2015-05-07 MED ORDER — ONDANSETRON HCL 4 MG/2ML IJ SOLN: 4.0000 mg | Freq: Four times a day (QID) | INTRAMUSCULAR | Status: DC | PRN

## 2015-05-07 MED ORDER — OXYCODONE HCL 5 MG PO TABS
5.0000 mg | ORAL_TABLET | ORAL | Status: DC | PRN
Start: 2015-05-07 — End: 2015-05-20

## 2015-05-07 MED ORDER — LACTATED RINGERS IV SOLN
INTRAVENOUS | Status: DC
  Administered 2015-05-07: 14:00:00 via INTRAVENOUS

## 2015-05-07 MED ORDER — OXYCODONE HCL 5 MG PO TABS: 5.0000 mg | ORAL_TABLET | ORAL | Status: DC | PRN

## 2015-05-07 MED ORDER — LIDOCAINE HCL (CARDIAC) 20 MG/ML IV SOLN
INTRAVENOUS | Status: DC | PRN
  Administered 2015-05-07: 40 mg via INTRATRACHEAL

## 2015-05-07 MED ORDER — DEXAMETHASONE SODIUM PHOSPHATE 4 MG/ML IJ SOLN
INTRAMUSCULAR | Status: DC | PRN
  Administered 2015-05-07: 4 mg via INTRAVENOUS

## 2015-05-07 MED ORDER — EPHEDRINE SULFATE 50 MG/ML IJ SOLN
INTRAMUSCULAR | Status: DC | PRN
  Administered 2015-05-07: 10 mg via INTRAVENOUS
  Administered 2015-05-07: 5 mg via INTRAVENOUS

## 2015-05-07 MED ORDER — MIDAZOLAM HCL 2 MG/2ML IJ SOLN: 1.0000 mg | INTRAMUSCULAR | Status: DC | PRN

## 2015-05-07 MED ORDER — POTASSIUM CHLORIDE IN NACL 20-0.9 MEQ/L-% IV SOLN: INTRAVENOUS | Status: DC

## 2015-05-07 MED ORDER — TRIAMCINOLONE ACETONIDE 40 MG/ML IJ SUSP
INTRAMUSCULAR | Status: DC | PRN
  Administered 2015-05-07: 2.5 mL

## 2015-05-07 MED ORDER — PROPOFOL 10 MG/ML IV BOLUS
INTRAVENOUS | Status: DC | PRN
  Administered 2015-05-07: 160 mg via INTRAVENOUS

## 2015-05-07 MED ORDER — METOCLOPRAMIDE HCL 5 MG/ML IJ SOLN: 5.0000 mg | Freq: Three times a day (TID) | INTRAMUSCULAR | Status: DC | PRN

## 2015-05-07 MED ORDER — SODIUM CHLORIDE 0.9 % IV SOLN
900.0000 mg | Freq: Once | INTRAVENOUS | Status: AC
  Administered 2015-05-07: 900 mg via INTRAVENOUS

## 2015-05-07 MED ORDER — ONDANSETRON HCL 4 MG/2ML IJ SOLN
INTRAMUSCULAR | Status: DC | PRN
  Administered 2015-05-07: 4 mg via INTRAVENOUS

## 2015-05-07 MED ORDER — FENTANYL CITRATE (PF) 100 MCG/2ML IJ SOLN: 25.0000 ug | INTRAMUSCULAR | Status: DC | PRN

## 2015-05-07 MED ORDER — ONDANSETRON HCL 4 MG PO TABS: 4.0000 mg | ORAL_TABLET | Freq: Four times a day (QID) | ORAL | Status: DC | PRN

## 2015-05-07 MED ORDER — METOCLOPRAMIDE HCL 5 MG PO TABS: 5.0000 mg | ORAL_TABLET | Freq: Three times a day (TID) | ORAL | Status: DC | PRN

## 2015-05-07 SURGICAL SUPPLY — 34 items
BIT DRILL JUGRKNT W/NDL BIT2.9 (DRILL) IMPLANT
BLADE FULL RADIUS 3.5 (BLADE) ×3 IMPLANT
BUR ACROMIONIZER 4.0 (BURR) ×3 IMPLANT
CHLORAPREP W/TINT 26ML (MISCELLANEOUS) ×6 IMPLANT
COVER LIGHT HANDLE UNIVERSAL (MISCELLANEOUS) ×6 IMPLANT
COVER MAYO STAND STRL (DRAPES) ×3 IMPLANT
DRAPE IMP U-DRAPE 54X76 (DRAPES) ×6 IMPLANT
DRILL JUGGERKNOT W/NDL BIT 2.9 (DRILL)
GAUZE PETRO XEROFOAM 1X8 (MISCELLANEOUS) ×3 IMPLANT
GAUZE SPONGE 4X4 12PLY STRL (GAUZE/BANDAGES/DRESSINGS) ×3 IMPLANT
GLOVE BIO SURGEON STRL SZ8 (GLOVE) ×6 IMPLANT
GLOVE INDICATOR 8.0 STRL GRN (GLOVE) ×3 IMPLANT
GOWN STRL REUS W/ TWL LRG LVL3 (GOWN DISPOSABLE) ×1 IMPLANT
GOWN STRL REUS W/ TWL XL LVL3 (GOWN DISPOSABLE) ×1 IMPLANT
GOWN STRL REUS W/TWL LRG LVL3 (GOWN DISPOSABLE) ×3
GOWN STRL REUS W/TWL XL LVL3 (GOWN DISPOSABLE) ×3
IV LACTATED RINGER IRRG 3000ML (IV SOLUTION) ×6
IV LR IRRIG 3000ML ARTHROMATIC (IV SOLUTION) ×2 IMPLANT
KIT CANNULA 8X76-LX IN CANNULA (CANNULA) ×3 IMPLANT
KIT ROOM TURNOVER OR (KITS) ×3 IMPLANT
MANIFOLD 4PT FOR NEPTUNE1 (MISCELLANEOUS) ×3 IMPLANT
MAT BLUE FLOOR 46X72 FLO (MISCELLANEOUS) ×3 IMPLANT
NEEDLE HYPO 21X1.5 SAFETY (NEEDLE) ×3 IMPLANT
PACK ARTHROSCOPY SHOULDER (MISCELLANEOUS) ×3 IMPLANT
PAD GROUND ADULT SPLIT (MISCELLANEOUS) IMPLANT
SLING ARM M TX990204 (SOFTGOODS) ×3 IMPLANT
STAPLER SKIN PROX 35W (STAPLE) ×3 IMPLANT
STRAP BODY AND KNEE 60X3 (MISCELLANEOUS) ×6 IMPLANT
SUT ETHIBOND 0 MO6 C/R (SUTURE) IMPLANT
SUT VIC AB 2-0 CT1 27 (SUTURE)
SUT VIC AB 2-0 CT1 TAPERPNT 27 (SUTURE) IMPLANT
TAPE MICROFOAM 4IN (TAPE) ×3 IMPLANT
TUBING ARTHRO INFLOW-ONLY STRL (TUBING) ×3 IMPLANT
WAND HAND CNTRL MULTIVAC 90 (MISCELLANEOUS) ×3 IMPLANT

## 2015-05-07 NOTE — Op Note (Signed)
05/07/2015  2:59 PM  Patient:   Carrie Myers  Pre-Op Diagnosis:   1.  Impingement/tendinopathy with probable biceps tendinopathy, right shoulder.    2.  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  Postoperative diagnosis: 1.  Impingement/tendinopathy with degenerative labral fraying and moderate degenerative joint disease, right shoulder.                                                 2.  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  Procedure: 1.  Extensive arthroscopic debridement and arthroscopic subacromial decompression, right shoulder.                        2.  Steroid injection right thumb CMC joint.  Anesthesia: General LMA with interscalene block placed preoperatively by the anesthesiologist.  Surgeon:   Pascal Lux, MD  Assistant:   None  Findings: As above. There were extensive grade 3 chondromalacial changes involving both the glenoid and humeral head. The rotator cuff was in satisfactory condition, other than some minor articular surface fraying of the anterior insertional fibers of the supraspinatus involving less than 10% of the footprint. The biceps tendon itself was in satisfactory condition. There was extensive degenerative fraying of the labrum anteriorly, superiorly, and postero-superiorly.  Complications: None  Fluids:   850 cc  Estimated blood loss: 10 cc  Tourniquet time: None  Drains: None  Closure: Staples   Brief clinical note: The patient is a 70 year old female with a history of right shoulder pain. The patient's symptoms have progressed despite medications, activity modification, etc. The patient's history and examination are consistent with impingement/tendinopathy. An MRI scan confirms these findings but showed no obvious tearing of the rotator cuff. The patient presents at this time for definitive management of her shoulder symptoms.  Procedure: The patient underwent placement of an  interscalene block by the anesthesiologist in the preoperative holding area before she was brought into the operating room and lain in the supine position. The patient then underwent general laryngeal mask anesthesia before being repositioned in the beach chair position using the beach chair positioner. The right shoulder and upper extremity were prepped with ChloraPrep solution before being draped sterilely. Preoperative antibiotics were administered. A timeout was performed to confirm the proper surgical site before the expected portal sites and incision site were injected with 0.5% Sensorcaine with epinephrine. A posterior portal was created and the glenohumeral joint thoroughly inspected with the findings as described above. An anterior portal was created using an outside-in technique. The labrum and rotator cuff were further probed, again confirming the above-noted findings. The areas of loose articular cartilage on both the glenoid and humeral head were debrided back to stable margins using the full-radius resector. In addition, the areas of labral fraying, as well as the area of degenerative changes of the articular surface of the anterior insertional fibers of the supraspinatus were debrided back to stable margins using the full-radius resector. Finally, areas of extensive synovitis also were debrided using the full-radius resector. The biceps tendon itself was inspected and found to be in good condition without evidence for significant tendinopathy changes or tenosynovitis. The ArthroCare wand was inserted and used to obtain hemostasis as well as to "anneal" the labrum superiorly and anteriorly. The instruments were removed from the joint after suctioning the excess  fluid.  The camera was repositioned through the posterior portal into the subacromial space. A separate lateral portal was created using an outside-in technique. The 3.5 mm full-radius resector was introduced and used to perform a subtotal  bursectomy. The ArthroCare wand was then inserted and used to remove the periosteal tissue off the undersurface of the anterior third of the acromion as well as to recess the coracoacromial ligament from its attachment along the anterior and lateral margins of the acromion. The 4.0 mm acromionizing bur was introduced and used to complete the decompression by removing the undersurface of the anterior third of the acromion. The full radius resector was reintroduced to remove any residual bony debris before the ArthroCare wand was reintroduced to obtain hemostasis. The instruments were then removed from the subacromial space after suctioning the excess fluid.  The portal sites were closed using staples. A sterile bulky dressing was applied to the shoulder before the arm was placed into a shoulder sling. The drapes were then removed. The right thumb CMC joint was injected using a solution of 0.5 cc of Kenalog-40 (20 mg) and 1.5 cc of 0.5% Sensorcaine. The patient was then awakened, extubated, and returned to the recovery room in satisfactory condition after tolerating the procedures well.

## 2015-05-07 NOTE — Anesthesia Preprocedure Evaluation (Addendum)
Anesthesia Evaluation  Patient identified by MRN, date of birth, ID band  Airway Mallampati: II  TM Distance: >3 FB     Dental   Pulmonary former smoker,    breath sounds clear to auscultation       Cardiovascular hypertension, Pt. on medications + CAD, + Past MI and + Peripheral Vascular Disease   Rate:Normal  Pt is seen by cardiology every 6 months, last office visit reviewed. Pt optimized.   Neuro/Psych Anxiety    GI/Hepatic GERD  ,  Endo/Other  Hypothyroidism   Renal/GU Renal disease     Musculoskeletal  (+) Arthritis , Osteoarthritis,  Fibromyalgia -  Abdominal   Peds  Hematology   Anesthesia Other Findings   Reproductive/Obstetrics                          Anesthesia Physical Anesthesia Plan  ASA: III  Anesthesia Plan: General and Regional   Post-op Pain Management: GA combined w/ Regional for post-op pain   Induction: Intravenous  Airway Management Planned: LMA  Additional Equipment:   Intra-op Plan:   Post-operative Plan:   Informed Consent: I have reviewed the patients History and Physical, chart, labs and discussed the procedure including the risks, benefits and alternatives for the proposed anesthesia with the patient or authorized representative who has indicated his/her understanding and acceptance.     Plan Discussed with: CRNA  Anesthesia Plan Comments:         Anesthesia Quick Evaluation

## 2015-05-07 NOTE — Transfer of Care (Signed)
Immediate Anesthesia Transfer of Care Note  Patient: Carrie Myers  Procedure(s) Performed: Procedure(s): ARTHROSCOPY SHOULDER WITH DEBRIDEMENT DECOMPRESSION AND POSSIBLE BICEPS TENODESIS (Right)  Patient Location: PACU  Anesthesia Type: General, Regional  Level of Consciousness: awake, alert  and patient cooperative  Airway and Oxygen Therapy: Patient Spontanous Breathing and Patient connected to supplemental oxygen  Post-op Assessment: Post-op Vital signs reviewed, Patient's Cardiovascular Status Stable, Respiratory Function Stable, Patent Airway and No signs of Nausea or vomiting  Post-op Vital Signs: Reviewed and stable  Complications: No apparent anesthesia complications

## 2015-05-07 NOTE — Discharge Instructions (Signed)
General Anesthesia, Adult, Care After Refer to this sheet in the next few weeks. These instructions provide you with information on caring for yourself after your procedure. Your health care provider may also give you more specific instructions. Your treatment has been planned according to current medical practices, but problems sometimes occur. Call your health care provider if you have any problems or questions after your procedure. WHAT TO EXPECT AFTER THE PROCEDURE After the procedure, it is typical to experience:  Sleepiness.  Nausea and vomiting. HOME CARE INSTRUCTIONS  For the first 24 hours after general anesthesia:  Have a responsible person with you.  Do not drive a car. If you are alone, do not take public transportation.  Do not drink alcohol.  Do not take medicine that has not been prescribed by your health care provider.  Do not sign important papers or make important decisions.  You may resume a normal diet and activities as directed by your health care provider.  Change bandages (dressings) as directed.  If you have questions or problems that seem related to general anesthesia, call the hospital and ask for the anesthetist or anesthesiologist on call. SEEK MEDICAL CARE IF:  You have nausea and vomiting that continue the day after anesthesia.  You develop a rash. SEEK IMMEDIATE MEDICAL CARE IF:   You have difficulty breathing.  You have chest pain.  You have any allergic problems.   This information is not intended to replace advice given to you by your health care provider. Make sure you discuss any questions you have with your health care provider.   Document Released: 06/07/2000 Document Revised: 03/22/2014 Document Reviewed: 06/30/2011 Elsevier Interactive Patient Education 2016 [employer].  Keep dressing dry and intact.  May shower after dressing changed on post-op day #4 (Sunday).  Cover staples with Band-Aids after drying off. Apply ice  frequently to shoulder. Keep shoulder immobilizer on at all times except may remove for bathing purposes. Follow-up in 10-14 days or as scheduled.

## 2015-05-07 NOTE — H&P (Signed)
Paper H&P to be scanned into permanent record. H&P reviewed. No changes. 

## 2015-05-07 NOTE — Anesthesia Postprocedure Evaluation (Signed)
Anesthesia Post Note  Patient: Carrie Myers  Procedure(s) Performed: Procedure(s) (LRB): Extensive arthroscopic debridement and arthroscopic subacromial decompression, right shoulder.  2. Steroid injection right thumb CMC joint. (Right)  Patient location during evaluation: PACU Anesthesia Type: General and Regional Level of consciousness: awake and alert Pain management: pain level controlled Vital Signs Assessment: post-procedure vital signs reviewed and stable Respiratory status: spontaneous breathing, nonlabored ventilation and respiratory function stable Cardiovascular status: blood pressure returned to baseline and stable Postop Assessment: no signs of nausea or vomiting Anesthetic complications: no    DANIEL D KOVACS

## 2015-05-07 NOTE — Anesthesia Procedure Notes (Signed)
Procedure Name: LMA Insertion Performed by: Nat Christen Pre-anesthesia Checklist: Patient identified, Emergency Drugs available, Suction available and Patient being monitored Patient Re-evaluated:Patient Re-evaluated prior to inductionOxygen Delivery Method: Circle system utilized Preoxygenation: Pre-oxygenation with 100% oxygen Intubation Type: IV induction LMA: LMA inserted LMA Size: 4.0 Number of attempts: 1 Placement Confirmation: positive ETCO2 and breath sounds checked- equal and bilateral Tube secured with: Tape

## 2015-05-08 ENCOUNTER — Encounter: Payer: Self-pay | Admitting: Surgery

## 2015-05-09 ENCOUNTER — Encounter: Payer: Self-pay | Admitting: Surgery

## 2015-05-09 DIAGNOSIS — S43439A Superior glenoid labrum lesion of unspecified shoulder, initial encounter: Secondary | ICD-10-CM | POA: Insufficient documentation

## 2015-05-13 DIAGNOSIS — G8929 Other chronic pain: Secondary | ICD-10-CM | POA: Diagnosis not present

## 2015-05-13 DIAGNOSIS — M24111 Other articular cartilage disorders, right shoulder: Secondary | ICD-10-CM | POA: Diagnosis not present

## 2015-05-13 DIAGNOSIS — M6281 Muscle weakness (generalized): Secondary | ICD-10-CM | POA: Diagnosis not present

## 2015-05-13 DIAGNOSIS — M25511 Pain in right shoulder: Secondary | ICD-10-CM | POA: Diagnosis not present

## 2015-05-13 DIAGNOSIS — M25611 Stiffness of right shoulder, not elsewhere classified: Secondary | ICD-10-CM | POA: Diagnosis not present

## 2015-05-15 DIAGNOSIS — M25511 Pain in right shoulder: Secondary | ICD-10-CM | POA: Diagnosis not present

## 2015-05-15 DIAGNOSIS — G8929 Other chronic pain: Secondary | ICD-10-CM | POA: Diagnosis not present

## 2015-05-20 ENCOUNTER — Other Ambulatory Visit: Payer: Self-pay

## 2015-05-20 ENCOUNTER — Encounter: Payer: Self-pay | Admitting: Internal Medicine

## 2015-05-20 ENCOUNTER — Ambulatory Visit (INDEPENDENT_AMBULATORY_CARE_PROVIDER_SITE_OTHER): Payer: Medicare Other | Admitting: Internal Medicine

## 2015-05-20 VITALS — BP 156/90 | HR 85 | Temp 98.1°F | Ht 68.0 in | Wt 180.0 lb

## 2015-05-20 DIAGNOSIS — Z1231 Encounter for screening mammogram for malignant neoplasm of breast: Secondary | ICD-10-CM | POA: Diagnosis not present

## 2015-05-20 DIAGNOSIS — J0140 Acute pansinusitis, unspecified: Secondary | ICD-10-CM

## 2015-05-20 DIAGNOSIS — I219 Acute myocardial infarction, unspecified: Secondary | ICD-10-CM | POA: Insufficient documentation

## 2015-05-20 MED ORDER — LEVOFLOXACIN 500 MG PO TABS: 500.0000 mg | ORAL_TABLET | Freq: Every day | ORAL | Status: DC

## 2015-05-20 NOTE — Patient Instructions (Signed)
Breast Self-Awareness Practicing breast self-awareness may pick up problems early, prevent significant medical complications, and possibly save your life. By practicing breast self-awareness, you can become familiar with how your breasts look and feel and if your breasts are changing. This allows you to notice changes early. It can also offer you some reassurance that your breast health is good. One way to learn what is normal for your breasts and whether your breasts are changing is to do a breast self-exam. If you find a lump or something that was not present in the past, it is best to contact your caregiver right away. Other findings that should be evaluated by your caregiver include nipple discharge, especially if it is bloody; skin changes or reddening; areas where the skin seems to be pulled in (retracted); or new lumps and bumps. Breast pain is seldom associated with cancer (malignancy), but should also be evaluated by a caregiver. HOW TO PERFORM A BREAST SELF-EXAM The best time to examine your breasts is 5-7 days after your menstrual period is over. During menstruation, the breasts are lumpier, and it may be more difficult to pick up changes. If you do not menstruate, have reached menopause, or had your uterus removed (hysterectomy), you should examine your breasts at regular intervals, such as monthly. If you are breastfeeding, examine your breasts after a feeding or after using a breast pump. Breast implants do not decrease the risk for lumps or tumors, so continue to perform breast self-exams as recommended. Talk to your caregiver about how to determine the difference between the implant and breast tissue. Also, talk about the amount of pressure you should use during the exam. Over time, you will become more familiar with the variations of your breasts and more comfortable with the exam. A breast self-exam requires you to remove all your clothes above the waist. 1. Look at your breasts and nipples.  Stand in front of a mirror in a room with good lighting. With your hands on your hips, push your hands firmly downward. Look for a difference in shape, contour, and size from one breast to the other (asymmetry). Asymmetry includes puckers, dips, or bumps. Also, look for skin changes, such as reddened or scaly areas on the breasts. Look for nipple changes, such as discharge, dimpling, repositioning, or redness. 2. Carefully feel your breasts. This is best done either in the shower or tub while using soapy water or when flat on your back. Place the arm (on the side of the breast you are examining) above your head. Use the pads (not the fingertips) of your three middle fingers on your opposite hand to feel your breasts. Start in the underarm area and use  inch (2 cm) overlapping circles to feel your breast. Use 3 different levels of pressure (light, medium, and firm pressure) at each circle before moving to the next circle. The light pressure is needed to feel the tissue closest to the skin. The medium pressure will help to feel breast tissue a little deeper, while the firm pressure is needed to feel the tissue close to the ribs. Continue the overlapping circles, moving downward over the breast until you feel your ribs below your breast. Then, move one finger-width towards the center of the body. Continue to use the  inch (2 cm) overlapping circles to feel your breast as you move slowly up toward the collar bone (clavicle) near the base of the neck. Continue the up and down exam using all 3 pressures until you reach the   middle of the chest. Do this with each breast, carefully feeling for lumps or changes. 3.  Keep a written record with breast changes or normal findings for each breast. By writing this information down, you do not need to depend only on memory for size, tenderness, or location. Write down where you are in your menstrual cycle, if you are still menstruating. Breast tissue can have some lumps or  thick tissue. However, see your caregiver if you find anything that concerns you.  SEEK MEDICAL CARE IF:  You see a change in shape, contour, or size of your breasts or nipples.   You see skin changes, such as reddened or scaly areas on the breasts or nipples.   You have an unusual discharge from your nipples.   You feel a new lump or unusually thick areas.    This information is not intended to replace advice given to you by your health care provider. Make sure you discuss any questions you have with your health care provider.   Document Released: 03/01/2005 Document Revised: 02/16/2012 Document Reviewed: 06/16/2011 Elsevier Interactive Patient Education 2016 [employer]vier Inc[employer]  

## 2015-05-20 NOTE — Progress Notes (Signed)
Date:  05/20/2015   Name:  Carrie Myers   DOB:  [birth year]   MRN:  [id number]   Chief Complaint: Sinusitis and Cough Sinusitis This is a new problem. The current episode started in the past 7 days. The problem has been gradually worsening since onset. There has been no fever. She is experiencing no pain. Associated symptoms include congestion and coughing. Pertinent negatives include no chills or sore throat. Past treatments include oral decongestants. The treatment provided mild relief.  Cough This is a new problem. The current episode started in the past 7 days. The problem has been unchanged. The problem occurs hourly. Pertinent negatives include no chest pain, chills, fever, sore throat or wheezing.      Review of Systems  Constitutional: Negative for fever, chills and fatigue.  HENT: Positive for congestion. Negative for sore throat, tinnitus, trouble swallowing and voice change.   Respiratory: Positive for cough. Negative for chest tightness and wheezing.   Cardiovascular: Negative for chest pain and palpitations.    Patient Active Problem List   Diagnosis Date Noted  . Myocardial infarction ([place]) 05/20/2015  . Impingement syndrome of right shoulder 04/17/2015  . Other shoulder lesions, right shoulder 04/17/2015  . CAD in native artery 01/18/2015  . Fibrocystic breast 01/18/2015  . Acid reflux 01/18/2015  . Hypothyroidism, postablative 01/18/2015  . Fracture of right foot 01/14/2015  . Arteriosclerosis of coronary artery 12/29/2014  . DJD of shoulder 11/13/2014  . Fibromyalgia 10/02/2014  . Intercostal neuralgia 10/02/2014  . DDD (degenerative disc disease), lumbar 10/02/2014  . Facet syndrome, lumbar 10/02/2014  . Sacroiliac joint disease 10/02/2014  . Carotid artery narrowing 07/09/2014  . Benign essential HTN 07/01/2014  . HLD (hyperlipidemia) 09/22/2012  . Arthritis of hand, degenerative 09/22/2012    Prior to Admission medications   Medication Sig Start  Date End Date Taking? Authorizing Provider  citalopram (CELEXA) 20 MG tablet Take 20 mg by mouth daily.   Yes Historical Provider, MD  cyclobenzaprine (FLEXERIL) 10 MG tablet  07/17/13  Yes Historical Provider, MD  diazepam (VALIUM) 5 MG tablet Limit one tablet by mouth per day if tolerated 04/30/15  Yes Mohammed Kindle, MD  Diclofenac-Misoprostol 75-0.2 MG TBEC Take 1 tablet by mouth 2 (two) times daily.   Yes Historical Provider, MD  esomeprazole (NEXIUM) 20 MG capsule Take 40 mg by mouth daily.   Yes Historical Provider, MD  HYDROcodone-acetaminophen (NORCO/VICODIN) 5-325 MG tablet Limit 3-6 tablets by mouth per day if tolerated 04/30/15  Yes Mohammed Kindle, MD  levothyroxine (SYNTHROID, LEVOTHROID) 88 MCG tablet Take 88 mcg by mouth daily before breakfast.   Yes Historical Provider, MD  losartan (COZAAR) 50 MG tablet Take 50 mg by mouth daily.   Yes Historical Provider, MD  Multiple Vitamins-Minerals (CENTRUM SILVER ADULT 50+) TABS Take 1 tablet by mouth daily.   Yes Historical Provider, MD  Nutritional Supplements (NUTRITIONAL DRINK PO) Take by mouth. herba life   Yes Historical Provider, MD  pravastatin (PRAVACHOL) 80 MG tablet Take 80 mg by mouth daily.   Yes Historical Provider, MD  traMADol (ULTRAM) 50 MG tablet Take 50 mg by mouth 2 (two) times daily.    Yes Historical Provider, MD    Allergies  Allergen Reactions  . Augmentin [Amoxicillin-Pot Clavulanate] Nausea And Vomiting  . Codeine Nausea And Vomiting  . Shellfish Allergy Nausea And Vomiting    No issues with iodine  . Sulfa Antibiotics Nausea And Vomiting    Past Surgical History  Procedure  Laterality Date  . Appendectomy    . Cesarean section    . Abdominal hysterectomy      total  . Bunionectomy Bilateral   . Dental implant      [age]  . Colonoscopy  2010    normal  . Coronary angioplasty with stent placement  04/2006  . Shoulder arthroscopy Right 05/07/2015    Procedure: Extensive arthroscopic debridement and  arthroscopic subacromial decompression, right shoulder.  2. Steroid injection right thumb CMC joint.;  Surgeon: Corky Mull, MD;  Location: [facility];  Service: Orthopedics;  Laterality: Right;    Social History  Substance Use Topics  . Smoking status: Former Smoker    Types: Cigarettes  . Smokeless tobacco: Former Systems developer    Quit date: 04/22/2006  . Alcohol Use: No     Medication list has been reviewed and updated.   Physical Exam  Constitutional: She is oriented to person, place, and time. She appears well-developed and well-nourished.  HENT:  Right Ear: External ear and ear canal normal. Tympanic membrane is not erythematous and not retracted.  Left Ear: External ear and ear canal normal. Tympanic membrane is not erythematous and not retracted.  Nose: Right sinus exhibits maxillary sinus tenderness and frontal sinus tenderness. Left sinus exhibits maxillary sinus tenderness and frontal sinus tenderness.  Mouth/Throat: Uvula is midline and mucous membranes are normal. No oral lesions. Posterior oropharyngeal erythema present. No oropharyngeal exudate.  Cardiovascular: Normal rate, regular rhythm and normal heart sounds.   Pulmonary/Chest: Breath sounds normal. She has no wheezes. She has no rales.  Lymphadenopathy:    She has no cervical adenopathy.  Neurological: She is alert and oriented to person, place, and time.    BP 156/90 mmHg  Pulse 85  Temp(Src) 98.1 F (36.7 C) (Oral)  Ht 5\' 8"  (1.727 m)  Wt 180 lb (81.647 kg)  BMI 27.38 kg/m2  SpO2 96%  Assessment and Plan: 1. Acute pansinusitis, recurrence not specified Continue claritin; resume Flonase - levofloxacin (LEVAQUIN) 500 MG tablet; Take 1 tablet (500 mg total) by mouth daily.  Dispense: 10 tablet; Refill: 0  2. Encounter for screening mammogram for breast cancer - MM DIGITAL SCREENING BILATERAL; Future   Halina Maidens, MD [facility]  05/20/2015

## 2015-05-21 ENCOUNTER — Other Ambulatory Visit: Payer: Self-pay | Admitting: Internal Medicine

## 2015-05-22 DIAGNOSIS — M25511 Pain in right shoulder: Secondary | ICD-10-CM | POA: Diagnosis not present

## 2015-05-22 DIAGNOSIS — M24111 Other articular cartilage disorders, right shoulder: Secondary | ICD-10-CM | POA: Diagnosis not present

## 2015-05-22 DIAGNOSIS — M25611 Stiffness of right shoulder, not elsewhere classified: Secondary | ICD-10-CM | POA: Diagnosis not present

## 2015-05-22 DIAGNOSIS — G8929 Other chronic pain: Secondary | ICD-10-CM | POA: Diagnosis not present

## 2015-05-26 DIAGNOSIS — M6281 Muscle weakness (generalized): Secondary | ICD-10-CM | POA: Diagnosis not present

## 2015-05-26 DIAGNOSIS — G8929 Other chronic pain: Secondary | ICD-10-CM | POA: Diagnosis not present

## 2015-05-26 DIAGNOSIS — M25611 Stiffness of right shoulder, not elsewhere classified: Secondary | ICD-10-CM | POA: Diagnosis not present

## 2015-05-26 DIAGNOSIS — M24111 Other articular cartilage disorders, right shoulder: Secondary | ICD-10-CM | POA: Diagnosis not present

## 2015-05-26 DIAGNOSIS — M25511 Pain in right shoulder: Secondary | ICD-10-CM | POA: Diagnosis not present

## 2015-05-27 ENCOUNTER — Ambulatory Visit: Payer: Medicare Other | Attending: Pain Medicine | Admitting: Pain Medicine

## 2015-05-27 ENCOUNTER — Encounter: Payer: Self-pay | Admitting: Pain Medicine

## 2015-05-27 VITALS — BP 107/85 | HR 88 | Temp 97.9°F | Resp 16 | Ht 68.0 in | Wt 180.0 lb

## 2015-05-27 DIAGNOSIS — M47816 Spondylosis without myelopathy or radiculopathy, lumbar region: Secondary | ICD-10-CM

## 2015-05-27 DIAGNOSIS — M19011 Primary osteoarthritis, right shoulder: Secondary | ICD-10-CM

## 2015-05-27 DIAGNOSIS — M25419 Effusion, unspecified shoulder: Secondary | ICD-10-CM | POA: Insufficient documentation

## 2015-05-27 DIAGNOSIS — M47817 Spondylosis without myelopathy or radiculopathy, lumbosacral region: Secondary | ICD-10-CM | POA: Diagnosis not present

## 2015-05-27 DIAGNOSIS — M5030 Other cervical disc degeneration, unspecified cervical region: Secondary | ICD-10-CM

## 2015-05-27 DIAGNOSIS — Z9889 Other specified postprocedural states: Secondary | ICD-10-CM | POA: Insufficient documentation

## 2015-05-27 DIAGNOSIS — M5136 Other intervertebral disc degeneration, lumbar region: Secondary | ICD-10-CM | POA: Diagnosis not present

## 2015-05-27 DIAGNOSIS — M5416 Radiculopathy, lumbar region: Secondary | ICD-10-CM | POA: Diagnosis not present

## 2015-05-27 DIAGNOSIS — M791 Myalgia: Secondary | ICD-10-CM | POA: Diagnosis not present

## 2015-05-27 DIAGNOSIS — M549 Dorsalgia, unspecified: Secondary | ICD-10-CM | POA: Diagnosis present

## 2015-05-27 DIAGNOSIS — M1990 Unspecified osteoarthritis, unspecified site: Secondary | ICD-10-CM | POA: Insufficient documentation

## 2015-05-27 DIAGNOSIS — G588 Other specified mononeuropathies: Secondary | ICD-10-CM

## 2015-05-27 DIAGNOSIS — M25519 Pain in unspecified shoulder: Secondary | ICD-10-CM | POA: Diagnosis present

## 2015-05-27 DIAGNOSIS — M533 Sacrococcygeal disorders, not elsewhere classified: Secondary | ICD-10-CM

## 2015-05-27 DIAGNOSIS — M5481 Occipital neuralgia: Secondary | ICD-10-CM

## 2015-05-27 DIAGNOSIS — M797 Fibromyalgia: Secondary | ICD-10-CM

## 2015-05-27 DIAGNOSIS — M19012 Primary osteoarthritis, left shoulder: Secondary | ICD-10-CM

## 2015-05-27 DIAGNOSIS — S92901A Unspecified fracture of right foot, initial encounter for closed fracture: Secondary | ICD-10-CM

## 2015-05-27 HISTORY — DX: Other specified postprocedural states: Z98.890

## 2015-05-27 MED ORDER — HYDROCODONE-ACETAMINOPHEN 5-325 MG PO TABS: ORAL_TABLET | ORAL | Status: DC

## 2015-05-27 MED ORDER — DIAZEPAM 5 MG PO TABS: ORAL_TABLET | ORAL | Status: DC

## 2015-05-27 NOTE — Progress Notes (Signed)
   Subjective:    Patient ID: Carrie Myers, female    DOB: May 03, 1945, 70 y.o.   MRN: [id number]  HPI    The patient is a 70 year old female who returns to pain management for further evaluation and treatment of pain involving shoulder entire back upper and lower extremity region. The patient is status post surgery of the shoulder at the present time is with pain back and lower extremity region fairly well-controlled. We discussed patient's condition and will avoid modification of medications at this time. We explained to patient that if there is any increased pain of the shoulder that we will for to avoid masking the pain by providing patient with additional medications. The patient will continue medications as prescribed and we will notify her surgeon as well as pain management should there be exacerbation of her pain. We will continue medications as discussed. The patient was in agreement with suggested treatment plan.  Review of Systems     Objective:   Physical Exam  There was tenderness of the splenius capitis and occipitalis musculature regions of mild degree with mild tenderness of the cervical facet cervical paraspinal musculature region. Palpation of the acromioclavicular and glenohumeral joint region was attends to palpation of moderate degree on the right compared to the left. There was decreased grip strength on the right compared to the left. Tinel and Phalen's maneuver were without increase of pain of significant degree. Palpation of the cervical facet cervical paraspinal musculature region was increased on the right compared to the left with tenderness of the trapezius levator scapula and rhomboid musculature regions a more significant degree on the right compared to the left. Palpation over the thoracic region was with no crepitus of the thoracic region noted. Palpation over the lumbar paraspinal must reason lumbar facet region was attends to palpation of mild to moderate degree  with lateral bending rotation extension and palpation over the lumbar facets reproducing mild to moderate discomfort. Straight leg raising was tolerated to 30 without increased pain with dorsiflexion noted. Patient with mild to moderate difficulty attempted to stand on tiptoes and heels. There was tenderness of the PSIS and PII S region a moderate degree. There was negative clonus negative Homans. No definite sensory deficit or dermatomal distribution detected. There was mild tenderness of the greater trochanteric region and iliotibial band region. Abdomen nontender with no costovertebral tenderness noted.      Assessment & Plan:     Sacroiliac joint dysfunction  Degenerative disc disease lumbar spine  Lumbar facet syndrome  Degenerative joint disease of shoulder 1. Mild to moderate supraspinatus tendinopathy and small shoulder joint effusion. 2. Moderate degenerative glenohumeral arthropathy and mild degenerative AC joint arthropathy.  Status post surgery of shoulder  Degenerative joint disease of shoulder  Sciatica (history of)     PLAN  Continue present medication Valium and hydrocodone acetaminophen   F/U PCP Dr.Berglund  for evaliation of  BP and general medical  condition.  F/U surgical evaluation with Dr. Roland Rack as discussed and as planned  F/U neurological evaluation. May consider PNCV/EMG studies and other studies pending follow-up evaluations  May consider radiofrequency rhizolysis or intraspinal procedures pending response to present treatment and F/U evaluation.. We will avoid such procedures at this time  Patient to call [facility] should patient have concerns prior to scheduled return appointment.

## 2015-05-27 NOTE — Progress Notes (Signed)
Patient here for medication management, refills.   Safety precautions to be maintained throughout the outpatient stay will include: orient to surroundings, keep bed in low position, maintain call bell within reach at all times, provide assistance with transfer out of bed and ambulation.

## 2015-05-27 NOTE — Patient Instructions (Addendum)
PLAN  Continue present medication Valium and hydrocodone acetaminophen   F/U PCP Dr.Berglund  for evaliation of  BP and general medical  condition.  F/U surgical evaluation with Dr. Roland Rack as discussed and as planned  F/U neurological evaluation. May consider PNCV/EMG studies and other studies pending follow-up evaluations  May consider radiofrequency rhizolysis or intraspinal procedures pending response to present treatment and F/U evaluation.. We will avoid such procedures at this time  Patient to call [facility] Center should patient have concerns prior to scheduled return appointment.

## 2015-05-28 DIAGNOSIS — M25511 Pain in right shoulder: Secondary | ICD-10-CM | POA: Diagnosis not present

## 2015-05-28 DIAGNOSIS — M25611 Stiffness of right shoulder, not elsewhere classified: Secondary | ICD-10-CM | POA: Diagnosis not present

## 2015-05-28 DIAGNOSIS — G8929 Other chronic pain: Secondary | ICD-10-CM | POA: Diagnosis not present

## 2015-05-28 DIAGNOSIS — M6281 Muscle weakness (generalized): Secondary | ICD-10-CM | POA: Diagnosis not present

## 2015-06-02 DIAGNOSIS — M6281 Muscle weakness (generalized): Secondary | ICD-10-CM | POA: Diagnosis not present

## 2015-06-02 DIAGNOSIS — M25611 Stiffness of right shoulder, not elsewhere classified: Secondary | ICD-10-CM | POA: Diagnosis not present

## 2015-06-02 DIAGNOSIS — G8929 Other chronic pain: Secondary | ICD-10-CM | POA: Diagnosis not present

## 2015-06-02 DIAGNOSIS — M25511 Pain in right shoulder: Secondary | ICD-10-CM | POA: Diagnosis not present

## 2015-06-04 DIAGNOSIS — M7581 Other shoulder lesions, right shoulder: Secondary | ICD-10-CM | POA: Diagnosis not present

## 2015-06-04 DIAGNOSIS — G8929 Other chronic pain: Secondary | ICD-10-CM | POA: Diagnosis not present

## 2015-06-04 DIAGNOSIS — M25611 Stiffness of right shoulder, not elsewhere classified: Secondary | ICD-10-CM | POA: Diagnosis not present

## 2015-06-04 DIAGNOSIS — M6281 Muscle weakness (generalized): Secondary | ICD-10-CM | POA: Diagnosis not present

## 2015-06-04 DIAGNOSIS — M25511 Pain in right shoulder: Secondary | ICD-10-CM | POA: Diagnosis not present

## 2015-06-09 DIAGNOSIS — M25611 Stiffness of right shoulder, not elsewhere classified: Secondary | ICD-10-CM | POA: Diagnosis not present

## 2015-06-09 DIAGNOSIS — M24111 Other articular cartilage disorders, right shoulder: Secondary | ICD-10-CM | POA: Diagnosis not present

## 2015-06-09 DIAGNOSIS — M6281 Muscle weakness (generalized): Secondary | ICD-10-CM | POA: Diagnosis not present

## 2015-06-09 DIAGNOSIS — G8929 Other chronic pain: Secondary | ICD-10-CM | POA: Diagnosis not present

## 2015-06-09 DIAGNOSIS — M25511 Pain in right shoulder: Secondary | ICD-10-CM | POA: Diagnosis not present

## 2015-06-11 DIAGNOSIS — M25511 Pain in right shoulder: Secondary | ICD-10-CM | POA: Diagnosis not present

## 2015-06-11 DIAGNOSIS — M25611 Stiffness of right shoulder, not elsewhere classified: Secondary | ICD-10-CM | POA: Diagnosis not present

## 2015-06-11 DIAGNOSIS — G8929 Other chronic pain: Secondary | ICD-10-CM | POA: Diagnosis not present

## 2015-06-11 DIAGNOSIS — M6281 Muscle weakness (generalized): Secondary | ICD-10-CM | POA: Diagnosis not present

## 2015-06-24 ENCOUNTER — Ambulatory Visit: Payer: Medicare Other | Attending: Pain Medicine | Admitting: Pain Medicine

## 2015-06-24 ENCOUNTER — Encounter: Payer: Self-pay | Admitting: Pain Medicine

## 2015-06-24 VITALS — BP 92/67 | HR 89 | Temp 97.7°F | Resp 18 | Ht 68.0 in | Wt 183.0 lb

## 2015-06-24 DIAGNOSIS — M25511 Pain in right shoulder: Secondary | ICD-10-CM | POA: Diagnosis present

## 2015-06-24 DIAGNOSIS — M25512 Pain in left shoulder: Secondary | ICD-10-CM | POA: Diagnosis present

## 2015-06-24 DIAGNOSIS — M4328 Fusion of spine, sacral and sacrococcygeal region: Secondary | ICD-10-CM | POA: Diagnosis not present

## 2015-06-24 DIAGNOSIS — M1288 Other specific arthropathies, not elsewhere classified, other specified site: Secondary | ICD-10-CM | POA: Insufficient documentation

## 2015-06-24 DIAGNOSIS — M19011 Primary osteoarthritis, right shoulder: Secondary | ICD-10-CM

## 2015-06-24 DIAGNOSIS — M5136 Other intervertebral disc degeneration, lumbar region: Secondary | ICD-10-CM | POA: Insufficient documentation

## 2015-06-24 DIAGNOSIS — M1990 Unspecified osteoarthritis, unspecified site: Secondary | ICD-10-CM | POA: Diagnosis not present

## 2015-06-24 DIAGNOSIS — M797 Fibromyalgia: Secondary | ICD-10-CM

## 2015-06-24 DIAGNOSIS — M25419 Effusion, unspecified shoulder: Secondary | ICD-10-CM | POA: Diagnosis not present

## 2015-06-24 DIAGNOSIS — Z9889 Other specified postprocedural states: Secondary | ICD-10-CM | POA: Diagnosis not present

## 2015-06-24 DIAGNOSIS — S92901A Unspecified fracture of right foot, initial encounter for closed fracture: Secondary | ICD-10-CM

## 2015-06-24 DIAGNOSIS — M545 Low back pain: Secondary | ICD-10-CM | POA: Diagnosis not present

## 2015-06-24 DIAGNOSIS — M5030 Other cervical disc degeneration, unspecified cervical region: Secondary | ICD-10-CM | POA: Diagnosis not present

## 2015-06-24 DIAGNOSIS — M5416 Radiculopathy, lumbar region: Secondary | ICD-10-CM | POA: Diagnosis not present

## 2015-06-24 DIAGNOSIS — M533 Sacrococcygeal disorders, not elsewhere classified: Secondary | ICD-10-CM | POA: Diagnosis not present

## 2015-06-24 DIAGNOSIS — M5481 Occipital neuralgia: Secondary | ICD-10-CM | POA: Diagnosis not present

## 2015-06-24 DIAGNOSIS — M47817 Spondylosis without myelopathy or radiculopathy, lumbosacral region: Secondary | ICD-10-CM | POA: Diagnosis not present

## 2015-06-24 DIAGNOSIS — M791 Myalgia: Secondary | ICD-10-CM | POA: Diagnosis not present

## 2015-06-24 DIAGNOSIS — M47816 Spondylosis without myelopathy or radiculopathy, lumbar region: Secondary | ICD-10-CM

## 2015-06-24 DIAGNOSIS — M19019 Primary osteoarthritis, unspecified shoulder: Secondary | ICD-10-CM | POA: Diagnosis not present

## 2015-06-24 DIAGNOSIS — G548 Other nerve root and plexus disorders: Secondary | ICD-10-CM | POA: Diagnosis not present

## 2015-06-24 DIAGNOSIS — G588 Other specified mononeuropathies: Secondary | ICD-10-CM

## 2015-06-24 DIAGNOSIS — M19012 Primary osteoarthritis, left shoulder: Secondary | ICD-10-CM | POA: Diagnosis not present

## 2015-06-24 MED ORDER — HYDROCODONE-ACETAMINOPHEN 5-325 MG PO TABS: ORAL_TABLET | ORAL | Status: DC

## 2015-06-24 MED ORDER — DIAZEPAM 5 MG PO TABS: ORAL_TABLET | ORAL | Status: DC

## 2015-06-24 NOTE — Progress Notes (Signed)
   Subjective:    Patient ID: Carrie Myers, female    DOB: 1945-03-23, 70 y.o.   MRN: [id number]  HPI  The patient is 70 year old female who returns to pain management for further evaluation and treatment of pain involving the region of the shoulder as well as the lower back and lower extremity region. The patient is status post surgery of the right shoulder and is with significant improvement. The patient is continuing exercising with caution to avoid aggravation of symptomatology. The patient will follow-up with Dr.Poggi for further orthopedic evaluation and treatment of shoulder. We discussed patient's pain of the lower back and lower extremity region as well as pain involving the region of the shoulder and remain available to consider patient for interventional treatment should settle to be necessary. The patient is without significant pain interfering with activities of daily living involving the shoulder of the lower back and lower extremity region at this time. We will continue hydrocodone acetaminophen and Valium and will consider modification of treatment regimen should they be significant change in patient's condition. All agreed to suggested treatment plan.      Review of Systems     Objective:   Physical Exam  There was tenderness of the splenius capitis and occipitalis musculature region a mild degree. Palpation over the trapezius levator scapula and rhomboid musculature region reproduces moderate discomfort on the right compared to the left. There appeared to be tenderness of the acromioclavicular and glenohumeral joint region a moderate degree on the right compared to the left and patient appeared to be with minimal difficulty performing drop test. There was tenderness to palpation over the region of the lower thoracic paraspinal musculature region of mild to moderate degree with mild to moderate muscle spasms. There was slightly decreased grip strength and Tinel and Phalen's  maneuver were without increased pain of significant degree. Palpation over the lumbar paraspinal musculatures and lumbar facet region was with mild discomfort with mild tends to palpation over the PSIS and PII S region as well as the gluteal and piriformis musculature region. Patient was with mild difficulty attending to stand on tiptoes and heels. Lateral bending rotation extension and palpation of the lumbar facets reproduce mild discomfort. There was mild tenderness of the greater trochanteric region and iliotibial band region. Straight leg raising was tolerated to 30 without increase of pain with dorsiflexion noted. There was negative clonus negative Homans. DTRs were difficult to elicit patient had difficulty relaxing.Abdomen nontender with no costovertebral tenderness noted      Assessment & Plan:     Degenerative disc disease lumbar spine  Lumbar facet syndrome  Sacroiliac joint dysfunction  Degenerative joint disease of shoulder 1. Mild to moderate supraspinatus tendinopathy and small shoulder joint effusion. 2. Moderate degenerative glenohumeral arthropathy and mild degenerative AC joint arthropathy.  Status post surgery of shoulder       PLAN  Continue present medication Valium and hydrocodone acetaminophen   F/U PCP Dr.Berglund  for evaliation of  BP and general medical  condition.  F/U surgical evaluation. Surgery of shoulder as planned with Dr. Roland Rack  F/U neurological evaluation. May consider pending follow-up evaluations  May consider radiofrequency rhizolysis or intraspinal procedures pending response to present treatment and F/U evaluation.  Patient to call [facility] Management Center should patient have concerns prior to scheduled return appointment.     Degenerative joint disease of shoulder

## 2015-06-24 NOTE — Progress Notes (Signed)
Safety precautions to be maintained throughout the outpatient stay will include: orient to surroundings, keep bed in low position, maintain call bell within reach at all times, provide assistance with transfer out of bed and ambulation.  

## 2015-06-24 NOTE — Patient Instructions (Addendum)
PLAN  Continue present medication Valium and hydrocodone acetaminophen   F/U PCP Dr.Berglund  for evaliation of  BP and general medical  condition.  F/U surgical evaluation. Surgery of shoulder as planned with Dr. Roland Rack  F/U neurological evaluation. May consider pending follow-up evaluations  May consider radiofrequency rhizolysis or intraspinal procedures pending response to present treatment and F/U evaluation.  Patient to call Pain Management Center should patient have concerns prior to scheduled return appointment.

## 2015-06-28 LAB — TOXASSURE SELECT 13 (MW), URINE: PDF: 0

## 2015-07-08 DIAGNOSIS — M6281 Muscle weakness (generalized): Secondary | ICD-10-CM | POA: Diagnosis not present

## 2015-07-08 DIAGNOSIS — M25511 Pain in right shoulder: Secondary | ICD-10-CM | POA: Diagnosis not present

## 2015-07-08 DIAGNOSIS — M25611 Stiffness of right shoulder, not elsewhere classified: Secondary | ICD-10-CM | POA: Diagnosis not present

## 2015-07-08 DIAGNOSIS — G8929 Other chronic pain: Secondary | ICD-10-CM | POA: Diagnosis not present

## 2015-07-16 DIAGNOSIS — M6281 Muscle weakness (generalized): Secondary | ICD-10-CM | POA: Diagnosis not present

## 2015-07-16 DIAGNOSIS — M25511 Pain in right shoulder: Secondary | ICD-10-CM | POA: Diagnosis not present

## 2015-07-16 DIAGNOSIS — G8929 Other chronic pain: Secondary | ICD-10-CM | POA: Diagnosis not present

## 2015-07-16 DIAGNOSIS — M25611 Stiffness of right shoulder, not elsewhere classified: Secondary | ICD-10-CM | POA: Diagnosis not present

## 2015-07-18 DIAGNOSIS — M25611 Stiffness of right shoulder, not elsewhere classified: Secondary | ICD-10-CM | POA: Diagnosis not present

## 2015-07-18 DIAGNOSIS — G8929 Other chronic pain: Secondary | ICD-10-CM | POA: Diagnosis not present

## 2015-07-18 DIAGNOSIS — M6281 Muscle weakness (generalized): Secondary | ICD-10-CM | POA: Diagnosis not present

## 2015-07-18 DIAGNOSIS — M25511 Pain in right shoulder: Secondary | ICD-10-CM | POA: Diagnosis not present

## 2015-07-22 ENCOUNTER — Ambulatory Visit: Payer: Medicare Other | Attending: Pain Medicine | Admitting: Pain Medicine

## 2015-07-22 ENCOUNTER — Encounter: Payer: Self-pay | Admitting: Pain Medicine

## 2015-07-22 VITALS — BP 135/61 | HR 91 | Temp 98.0°F | Resp 16 | Ht 68.0 in | Wt 182.0 lb

## 2015-07-22 DIAGNOSIS — M47817 Spondylosis without myelopathy or radiculopathy, lumbosacral region: Secondary | ICD-10-CM | POA: Diagnosis not present

## 2015-07-22 DIAGNOSIS — M47816 Spondylosis without myelopathy or radiculopathy, lumbar region: Secondary | ICD-10-CM

## 2015-07-22 DIAGNOSIS — M1990 Unspecified osteoarthritis, unspecified site: Secondary | ICD-10-CM

## 2015-07-22 DIAGNOSIS — M25419 Effusion, unspecified shoulder: Secondary | ICD-10-CM | POA: Insufficient documentation

## 2015-07-22 DIAGNOSIS — Z9889 Other specified postprocedural states: Secondary | ICD-10-CM | POA: Diagnosis not present

## 2015-07-22 DIAGNOSIS — M5030 Other cervical disc degeneration, unspecified cervical region: Secondary | ICD-10-CM

## 2015-07-22 DIAGNOSIS — M19019 Primary osteoarthritis, unspecified shoulder: Secondary | ICD-10-CM | POA: Diagnosis not present

## 2015-07-22 DIAGNOSIS — M19012 Primary osteoarthritis, left shoulder: Secondary | ICD-10-CM

## 2015-07-22 DIAGNOSIS — M62838 Other muscle spasm: Secondary | ICD-10-CM | POA: Insufficient documentation

## 2015-07-22 DIAGNOSIS — M25511 Pain in right shoulder: Secondary | ICD-10-CM | POA: Diagnosis present

## 2015-07-22 DIAGNOSIS — M533 Sacrococcygeal disorders, not elsewhere classified: Secondary | ICD-10-CM | POA: Insufficient documentation

## 2015-07-22 DIAGNOSIS — M797 Fibromyalgia: Secondary | ICD-10-CM

## 2015-07-22 DIAGNOSIS — G588 Other specified mononeuropathies: Secondary | ICD-10-CM

## 2015-07-22 DIAGNOSIS — M5481 Occipital neuralgia: Secondary | ICD-10-CM

## 2015-07-22 DIAGNOSIS — M791 Myalgia: Secondary | ICD-10-CM | POA: Diagnosis not present

## 2015-07-22 DIAGNOSIS — M5136 Other intervertebral disc degeneration, lumbar region: Secondary | ICD-10-CM | POA: Diagnosis not present

## 2015-07-22 DIAGNOSIS — M19011 Primary osteoarthritis, right shoulder: Secondary | ICD-10-CM

## 2015-07-22 DIAGNOSIS — S92901A Unspecified fracture of right foot, initial encounter for closed fracture: Secondary | ICD-10-CM

## 2015-07-22 DIAGNOSIS — M5416 Radiculopathy, lumbar region: Secondary | ICD-10-CM | POA: Diagnosis not present

## 2015-07-22 DIAGNOSIS — M25512 Pain in left shoulder: Secondary | ICD-10-CM | POA: Diagnosis present

## 2015-07-22 MED ORDER — HYDROCODONE-ACETAMINOPHEN 5-325 MG PO TABS: ORAL_TABLET | ORAL | Status: DC

## 2015-07-22 MED ORDER — DIAZEPAM 5 MG PO TABS: ORAL_TABLET | ORAL | Status: DC

## 2015-07-22 NOTE — Progress Notes (Signed)
   Subjective:    Patient ID: Carrie Myers, female    DOB: Jul 02, 1945, 70 y.o.   MRN: [id number]  HPI   The patient is a 70 year old female who returns to pain management for further evaluation and treatment of pain involving the shoulders entire back upper and lower extremity regions. The patient stated that she had to do some mild lifting after her shoulder surgery. The patient is status post surgery of the shoulder by Dr.Poggi . The patient will follow-up with Dr.Poggi as planned. The patient also is with lower back lower extremity pain. We have discussed additional modifications of treatment regimen and we'll remain available to consider interventional treatment should there be persistent pain despite noninterventional treatment measures. We have caution patient regarding her activity to avoid aggravation of symptomatology. We will consider additional treatment modifications as discussed pending follow-up evaluation and we'll continue medications as prescribed at this time. We will also discussed decreasing and eliminating diazepam. The patient call pain management for any concerns regarding condition prior to scheduled return appointment. All agreed to suggested treatment plan     Review of Systems     Objective:   Physical Exam  There was tenderness to palpation of the paraspinal muscular treat and cervical region cervical facet region palpation which reproduces pain of mild-to-moderate degree with mild to moderate tenderness of the trapezius levator scapula and rhomboid musculature regions. There was tends to palpation of the acromioclavicular and glenohumeral joint regions a moderate degree and patient was with some limited range of motion of the shoulders. The patient was able to perform drop test without severe difficulty. Grip strength appeared to be equal with Tinel and Phalen's maneuver reproducing minimal discomfort. There was tenderness over the thoracic facet thoracic paraspinal  musculature region with no crepitus of the thoracic region noted. Palpation of the lower thoracic paraspinal musculature region was with tenderness to palpation evidence of muscle spasms as well. Palpation of the PSIS and PII S region reproduces moderate discomfort with moderate tenderness of the greater trochanteric region iliotibial band region. There was straight leg raising tolerate to 30 without increased pain with dorsiflexion noted. There was negative clonus negative Homans. No sensory deficit or dermatomal distribution detected. Abdomen nontender with no costovertebral tenderness noted      Assessment & Plan:      Degenerative disc disease lumbar spine  Lumbar facet syndrome  Sacroiliac joint dysfunction  Degenerative joint disease of shoulder 1. Mild to moderate supraspinatus tendinopathy and small shoulder joint effusion. 2. Moderate degenerative glenohumeral arthropathy and mild degenerative AC joint arthropathy.  Status post surgery of shoulder      PLAN  Continue present medication Valium and hydrocodone acetaminophen   F/U PCP Dr.Berglund  for evaliation of  BP and general medical  condition.  F/U surgical evaluation. Patient will follow-up with  Dr. Roland Rack status post surgery of shoulder as discussed  F/U neurological evaluation. May consider pending follow-up evaluations  May consider radiofrequency rhizolysis or intraspinal procedures pending response to present treatment and F/U evaluation.  Patient to call Pain Management Center should patient have concerns prior to scheduled return appointment.

## 2015-07-22 NOTE — Patient Instructions (Addendum)
PLAN  Continue present medication Valium and hydrocodone acetaminophen   F/U PCP Dr.Berglund  for evaliation of  BP and general medical  condition.  F/U surgical evaluation. Surgery of shoulder as planned with Dr. Roland Rack  F/U neurological evaluation. May consider pending follow-up evaluations  May consider radiofrequency rhizolysis or intraspinal procedures pending response to present treatment and F/U evaluation.  Patient to call Pain Management Center should patient have concerns prior to scheduled return appointment. Pain Management Discharge Instructions  General Discharge Instructions :  If you need to reach your doctor call: Monday-Friday 8:00 am - 4:00 pm at [phone] or toll free [phone].  After clinic hours [phone] to have operator reach doctor.  Bring all of your medication bottles to all your appointments in the pain clinic.  To cancel or reschedule your appointment with Pain Management please remember to call 24 hours in advance to avoid a fee.  Refer to the educational materials which you have been given on: General Risks, I had my Procedure. Discharge Instructions, Post Sedation.  Post Procedure Instructions:  The drugs you were given will stay in your system until tomorrow, so for the next 24 hours you should not drive, make any legal decisions or drink any alcoholic beverages.  You may eat anything you prefer, but it is better to start with liquids then soups and crackers, and gradually work up to solid foods.  Please notify your doctor immediately if you have any unusual bleeding, trouble breathing or pain that is not related to your normal pain.  Depending on the type of procedure that was done, some parts of your body may feel week and/or numb.  This usually clears up by tonight or the next day.  Walk with the use of an assistive device or accompanied by an adult for the 24 hours.  You may use ice on the affected area for the first 24 hours.  Put  ice in a Ziploc bag and cover with a towel and place against area 15 minutes on 15 minutes off.  You may switch to heat after 24 hours.

## 2015-07-22 NOTE — Progress Notes (Signed)
Safety precautions to be maintained throughout the outpatient stay will include: orient to surroundings, keep bed in low position, maintain call bell within reach at all times, provide assistance with transfer out of bed and ambulation.  

## 2015-07-23 DIAGNOSIS — G8929 Other chronic pain: Secondary | ICD-10-CM | POA: Diagnosis not present

## 2015-07-23 DIAGNOSIS — M6281 Muscle weakness (generalized): Secondary | ICD-10-CM | POA: Diagnosis not present

## 2015-07-23 DIAGNOSIS — M25511 Pain in right shoulder: Secondary | ICD-10-CM | POA: Diagnosis not present

## 2015-07-23 DIAGNOSIS — M25611 Stiffness of right shoulder, not elsewhere classified: Secondary | ICD-10-CM | POA: Diagnosis not present

## 2015-07-25 DIAGNOSIS — M25511 Pain in right shoulder: Secondary | ICD-10-CM | POA: Diagnosis not present

## 2015-07-25 DIAGNOSIS — G8929 Other chronic pain: Secondary | ICD-10-CM | POA: Diagnosis not present

## 2015-07-25 DIAGNOSIS — M25611 Stiffness of right shoulder, not elsewhere classified: Secondary | ICD-10-CM | POA: Diagnosis not present

## 2015-07-25 DIAGNOSIS — M6281 Muscle weakness (generalized): Secondary | ICD-10-CM | POA: Diagnosis not present

## 2015-07-29 ENCOUNTER — Ambulatory Visit (INDEPENDENT_AMBULATORY_CARE_PROVIDER_SITE_OTHER): Payer: Medicare Other | Admitting: Internal Medicine

## 2015-07-29 ENCOUNTER — Encounter: Payer: Self-pay | Admitting: Internal Medicine

## 2015-07-29 VITALS — BP 128/86 | HR 74 | Temp 98.9°F | Resp 16 | Ht 68.0 in | Wt 179.0 lb

## 2015-07-29 DIAGNOSIS — J0100 Acute maxillary sinusitis, unspecified: Secondary | ICD-10-CM

## 2015-07-29 MED ORDER — LEVOFLOXACIN 500 MG PO TABS: 500.0000 mg | ORAL_TABLET | Freq: Every day | ORAL | Status: DC

## 2015-07-29 NOTE — Patient Instructions (Signed)

## 2015-07-29 NOTE — Progress Notes (Signed)
Date:  07/29/2015   Name:  Carrie Myers   DOB:  March 01, 1946   MRN:  [id number]   Chief Complaint: Sinus Problem Sinus Problem This is a new problem. The current episode started yesterday. The problem has been rapidly worsening since onset. There has been no fever. Associated symptoms include chills, congestion, coughing, a hoarse voice, sinus pressure and a sore throat. Past treatments include oral decongestants (and flonase). The treatment provided mild relief.      Review of Systems  Constitutional: Positive for chills.  HENT: Positive for congestion, hoarse voice, postnasal drip, sinus pressure and sore throat.   Eyes: Negative for visual disturbance.  Respiratory: Positive for cough. Negative for chest tightness and wheezing.   Cardiovascular: Negative for chest pain, palpitations and leg swelling.    Patient Active Problem List   Diagnosis Date Noted  . Status post shoulder surgery 05/27/2015  . Myocardial infarction ([place]) 05/20/2015  . Glenoid labral tear 05/09/2015  . Impingement syndrome of right shoulder 04/17/2015  . Other shoulder lesions, right shoulder 04/17/2015  . CAD in native artery 01/18/2015  . Fibrocystic breast 01/18/2015  . Acid reflux 01/18/2015  . Hypothyroidism, postablative 01/18/2015  . Fracture of right foot 01/14/2015  . Arteriosclerosis of coronary artery 12/29/2014  . DJD of shoulder 11/13/2014  . Fibromyalgia 10/02/2014  . Intercostal neuralgia 10/02/2014  . DDD (degenerative disc disease), lumbar 10/02/2014  . Facet syndrome, lumbar 10/02/2014  . Sacroiliac joint disease 10/02/2014  . Carotid artery narrowing 07/09/2014  . Benign essential HTN 07/01/2014  . HLD (hyperlipidemia) 09/22/2012  . Arthritis of hand, degenerative 09/22/2012    Prior to Admission medications   Medication Sig Start Date End Date Taking? Authorizing Provider  citalopram (CELEXA) 20 MG tablet Take 20 mg by mouth daily.   Yes Historical Provider, MD    cyclobenzaprine (FLEXERIL) 10 MG tablet Reported on 05/27/2015 07/17/13  Yes Historical Provider, MD  diazepam (VALIUM) 5 MG tablet Limit one tablet by mouth per day if tolerated 07/22/15  Yes Mohammed Kindle, MD  Diclofenac-Misoprostol 75-0.2 MG TBEC Take 1 tablet by mouth 2 (two) times daily.   Yes Historical Provider, MD  esomeprazole (NEXIUM) 20 MG capsule Take 40 mg by mouth daily.   Yes Historical Provider, MD  HYDROcodone-acetaminophen (NORCO/VICODIN) 5-325 MG tablet Limit 3-6 tablets by mouth per day if tolerated 07/22/15  Yes Mohammed Kindle, MD  levothyroxine (SYNTHROID, LEVOTHROID) 88 MCG tablet Take 1 tablet by mouth  daily 05/21/15  Yes [name], MD  losartan (COZAAR) 50 MG tablet Take 50 mg by mouth daily.   Yes Historical Provider, MD  Multiple Vitamins-Minerals (CENTRUM SILVER ADULT 50+) TABS Take 1 tablet by mouth daily.   Yes Historical Provider, MD  Nutritional Supplements (NUTRITIONAL DRINK PO) Take by mouth. herba life   Yes Historical Provider, MD  pravastatin (PRAVACHOL) 80 MG tablet Take 80 mg by mouth daily.   Yes Historical Provider, MD  traMADol (ULTRAM) 50 MG tablet Take 50 mg by mouth 2 (two) times daily.    Yes Historical Provider, MD    Allergies  Allergen Reactions  . Augmentin [Amoxicillin-Pot Clavulanate] Nausea And Vomiting  . Codeine Nausea And Vomiting  . Sulfa Antibiotics Nausea And Vomiting  . Shellfish Allergy Nausea And Vomiting, Nausea Only and Other (See Comments)    No issues with iodine No issues with iodine    Past Surgical History  Procedure Laterality Date  . Appendectomy    . Cesarean section    .  Abdominal hysterectomy      total  . Bunionectomy Bilateral   . Dental implant      [age]  . Colonoscopy  2010    normal  . Coronary angioplasty with stent placement  04/2006  . Shoulder arthroscopy Right 05/07/2015    Procedure: Extensive arthroscopic debridement and arthroscopic subacromial decompression, right shoulder.   2. Steroid injection right thumb CMC joint.;  Surgeon: Corky Mull, MD;  Location: [facility];  Service: Orthopedics;  Laterality: Right;    Social History  Substance Use Topics  . Smoking status: Former Smoker    Types: Cigarettes  . Smokeless tobacco: Former Systems developer    Quit date: 04/22/2006  . Alcohol Use: No     Medication list has been reviewed and updated.   Physical Exam  Constitutional: She is oriented to person, place, and time. She appears well-developed and well-nourished.  HENT:  Right Ear: External ear and ear canal normal. Tympanic membrane is not erythematous and not retracted.  Left Ear: External ear and ear canal normal. Tympanic membrane is not erythematous and not retracted.  Nose: Right sinus exhibits maxillary sinus tenderness and frontal sinus tenderness. Left sinus exhibits maxillary sinus tenderness and frontal sinus tenderness.  Mouth/Throat: Uvula is midline and mucous membranes are normal. No oral lesions. No oropharyngeal exudate or posterior oropharyngeal erythema.  Cardiovascular: Normal rate, regular rhythm and normal heart sounds.   Pulmonary/Chest: Breath sounds normal. She has no wheezes. She has no rales.  Lymphadenopathy:    She has no cervical adenopathy.  Neurological: She is alert and oriented to person, place, and time.    BP 128/86 mmHg  Pulse 74  Temp(Src) 98.9 F (37.2 C) (Oral)  Resp 16  Ht 5\' 8"  (1.727 m)  Wt 179 lb (81.194 kg)  BMI 27.22 kg/m2  SpO2 95%  Assessment and Plan: 1. Acute maxillary sinusitis, recurrence not specified Continue Flonase NS and otc cough syrup as needed - levofloxacin (LEVAQUIN) 500 MG tablet; Take 1 tablet (500 mg total) by mouth daily.  Dispense: 10 tablet; Refill: 0   Halina Maidens, MD [facility]  07/29/2015

## 2015-08-06 DIAGNOSIS — M25511 Pain in right shoulder: Secondary | ICD-10-CM | POA: Diagnosis not present

## 2015-08-06 DIAGNOSIS — G8929 Other chronic pain: Secondary | ICD-10-CM | POA: Diagnosis not present

## 2015-08-06 DIAGNOSIS — M25611 Stiffness of right shoulder, not elsewhere classified: Secondary | ICD-10-CM | POA: Diagnosis not present

## 2015-08-06 DIAGNOSIS — M6281 Muscle weakness (generalized): Secondary | ICD-10-CM | POA: Diagnosis not present

## 2015-08-18 ENCOUNTER — Ambulatory Visit: Payer: Medicare Other | Attending: Pain Medicine | Admitting: Pain Medicine

## 2015-08-18 ENCOUNTER — Encounter: Payer: Self-pay | Admitting: Pain Medicine

## 2015-08-18 VITALS — BP 135/79 | HR 87 | Temp 96.8°F | Resp 16 | Ht 68.0 in | Wt 183.0 lb

## 2015-08-18 DIAGNOSIS — M5136 Other intervertebral disc degeneration, lumbar region: Secondary | ICD-10-CM | POA: Diagnosis not present

## 2015-08-18 DIAGNOSIS — M19011 Primary osteoarthritis, right shoulder: Secondary | ICD-10-CM

## 2015-08-18 DIAGNOSIS — Z9889 Other specified postprocedural states: Secondary | ICD-10-CM

## 2015-08-18 DIAGNOSIS — M1288 Other specific arthropathies, not elsewhere classified, other specified site: Secondary | ICD-10-CM | POA: Diagnosis not present

## 2015-08-18 DIAGNOSIS — M5481 Occipital neuralgia: Secondary | ICD-10-CM

## 2015-08-18 DIAGNOSIS — M7580 Other shoulder lesions, unspecified shoulder: Secondary | ICD-10-CM | POA: Insufficient documentation

## 2015-08-18 DIAGNOSIS — M19019 Primary osteoarthritis, unspecified shoulder: Secondary | ICD-10-CM | POA: Diagnosis not present

## 2015-08-18 DIAGNOSIS — M51369 Other intervertebral disc degeneration, lumbar region without mention of lumbar back pain or lower extremity pain: Secondary | ICD-10-CM

## 2015-08-18 DIAGNOSIS — S92901A Unspecified fracture of right foot, initial encounter for closed fracture: Secondary | ICD-10-CM

## 2015-08-18 DIAGNOSIS — M19012 Primary osteoarthritis, left shoulder: Secondary | ICD-10-CM

## 2015-08-18 DIAGNOSIS — G588 Other specified mononeuropathies: Secondary | ICD-10-CM

## 2015-08-18 DIAGNOSIS — M533 Sacrococcygeal disorders, not elsewhere classified: Secondary | ICD-10-CM

## 2015-08-18 DIAGNOSIS — M5416 Radiculopathy, lumbar region: Secondary | ICD-10-CM | POA: Diagnosis not present

## 2015-08-18 DIAGNOSIS — M47817 Spondylosis without myelopathy or radiculopathy, lumbosacral region: Secondary | ICD-10-CM | POA: Diagnosis not present

## 2015-08-18 DIAGNOSIS — M797 Fibromyalgia: Secondary | ICD-10-CM

## 2015-08-18 DIAGNOSIS — M791 Myalgia: Secondary | ICD-10-CM | POA: Diagnosis not present

## 2015-08-18 DIAGNOSIS — M5030 Other cervical disc degeneration, unspecified cervical region: Secondary | ICD-10-CM

## 2015-08-18 DIAGNOSIS — M1990 Unspecified osteoarthritis, unspecified site: Secondary | ICD-10-CM

## 2015-08-18 DIAGNOSIS — M47816 Spondylosis without myelopathy or radiculopathy, lumbar region: Secondary | ICD-10-CM

## 2015-08-18 MED ORDER — DIAZEPAM 5 MG PO TABS: ORAL_TABLET | ORAL | Status: DC

## 2015-08-18 MED ORDER — HYDROCODONE-ACETAMINOPHEN 5-325 MG PO TABS: ORAL_TABLET | ORAL | Status: DC

## 2015-08-18 NOTE — Patient Instructions (Signed)
PLAN  Continue present medication Valium and hydrocodone acetaminophen . Most decrease and discontinue Valium as discussed  F/U PCP Dr.Berglund  for evaliation of  BP and general medical  condition.  F/U surgical evaluation. Surgery of shoulder as planned with Dr. Roland Rack  F/U neurological evaluation. May consider pending follow-up evaluations  May consider radiofrequency rhizolysis or intraspinal procedures pending response to present treatment and F/U evaluation.  Patient to call Pain Management Center should patient have concerns prior to scheduled return appointment.

## 2015-08-18 NOTE — Progress Notes (Signed)
   Subjective:    Patient ID: Carrie Myers, female    DOB: 1946-02-14, 70 y.o.   MRN: [id number]  HPI  Patient is 70 year old female who returns to pain management for further evaluation and treatment of pain involving the shoulder entire back upper and lower extremity regions. The patient is pain involving the lower back with pain radiating to the buttocks. The patient states that the pain is without significant interference of activities of daily living at this time. We discussed patient's condition and will continue present medications with a decrease in plan to discontinue Valium. Patient will continue hydrocodone acetaminophen as discussed. The patient denies trauma change in events of daily living the call significant change in symptomatology. The patient states that she has been exercising and has had improved range of motion of the shoulder and that the lower back and lower extremity pain is fairly well-controlled as well we will continue present medications with a decrease in the eventual discontinuation of Valium as discussed and as explained to patient on today's visit who was understanding and in agreement with suggested treatment plan       Review of Systems     Objective:   Physical Exam  There was tends to palpation of paraspinal musculature region of the cervical region cervical facet region palpation which reproduces mild discomfort with mild tenderness of the splenius capitis and occipitalis musculature regions. There was mild tenderness over the thoracic facet thoracic paraspinal musculature region is well. Palpation over the acromioclavicular and glenohumeral joint regions reproduce moderate discomfort with limited range of motion of the shoulder noted Tinel and Phalen's maneuver were without increased pain of significant degree and patient was with slightly decreased grip strength the patient was with mild difficulty performing drop test. Palpation of the thoracic region  was without crepitus of the thoracic region noted. Palpation over the lumbar paraspinal must reason lumbar facet region was with increased pain with lateral bending rotation extension and palpation of the lumbar facets reproducing mild to moderate discomfort. There was mild to moderate tenderness of the PSIS and PII S region as well as the gluteal and piriformis musculature region with mild tenderness along the greater trochanteric region iliotibial band region. No sensory deficit or dermatomal distribution was detected. Straight leg raise was tolerated to 30 without increased pain with dorsiflexion noted. There was negative clonus negative Homans. Abdomen nontender with no costovertebral tenderness noted      Assessment & Plan:     Degenerative disc disease lumbar spine  Lumbar facet syndrome  Sacroiliac joint dysfunction  Degenerative joint disease of shoulder 1. Mild to moderate supraspinatus tendinopathy and small shoulder joint effusion. 2. Moderate degenerative glenohumeral arthropathy and mild degenerative AC joint arthropathy.  Status post surgery of shoulder       PLAN  Continue present medication Valium and hydrocodone acetaminophen . Most decrease and discontinue Valium as discussed  F/U PCP Dr.Berglund  for evaliation of  BP and general medical  condition.  F/U surgical evaluation. Surgery of shoulder as planned with Dr. Roland Rack  F/U neurological evaluation. May consider pending follow-up evaluations  May consider radiofrequency rhizolysis or intraspinal procedures pending response to present treatment and F/U evaluation.  Patient to call Pain Management Center should patient have concerns prior to scheduled return appointment.

## 2015-08-19 ENCOUNTER — Ambulatory Visit: Payer: Medicare Other | Admitting: Pain Medicine

## 2015-09-18 ENCOUNTER — Ambulatory Visit: Payer: Medicare Other | Admitting: Pain Medicine

## 2015-09-19 ENCOUNTER — Encounter: Payer: Self-pay | Admitting: Pain Medicine

## 2015-09-19 ENCOUNTER — Ambulatory Visit: Payer: Medicare Other | Attending: Pain Medicine | Admitting: Pain Medicine

## 2015-09-19 VITALS — BP 110/73 | HR 89 | Temp 98.0°F | Resp 16 | Ht 68.0 in | Wt 180.0 lb

## 2015-09-19 DIAGNOSIS — G588 Other specified mononeuropathies: Secondary | ICD-10-CM

## 2015-09-19 DIAGNOSIS — M5136 Other intervertebral disc degeneration, lumbar region: Secondary | ICD-10-CM | POA: Diagnosis not present

## 2015-09-19 DIAGNOSIS — M79604 Pain in right leg: Secondary | ICD-10-CM | POA: Diagnosis present

## 2015-09-19 DIAGNOSIS — M5030 Other cervical disc degeneration, unspecified cervical region: Secondary | ICD-10-CM

## 2015-09-19 DIAGNOSIS — M545 Low back pain: Secondary | ICD-10-CM | POA: Diagnosis present

## 2015-09-19 DIAGNOSIS — M1288 Other specific arthropathies, not elsewhere classified, other specified site: Secondary | ICD-10-CM | POA: Diagnosis not present

## 2015-09-19 DIAGNOSIS — M19012 Primary osteoarthritis, left shoulder: Secondary | ICD-10-CM

## 2015-09-19 DIAGNOSIS — M533 Sacrococcygeal disorders, not elsewhere classified: Secondary | ICD-10-CM

## 2015-09-19 DIAGNOSIS — M51369 Other intervertebral disc degeneration, lumbar region without mention of lumbar back pain or lower extremity pain: Secondary | ICD-10-CM

## 2015-09-19 DIAGNOSIS — M1990 Unspecified osteoarthritis, unspecified site: Secondary | ICD-10-CM

## 2015-09-19 DIAGNOSIS — M19019 Primary osteoarthritis, unspecified shoulder: Secondary | ICD-10-CM | POA: Insufficient documentation

## 2015-09-19 DIAGNOSIS — M25419 Effusion, unspecified shoulder: Secondary | ICD-10-CM | POA: Insufficient documentation

## 2015-09-19 DIAGNOSIS — M5481 Occipital neuralgia: Secondary | ICD-10-CM

## 2015-09-19 DIAGNOSIS — M797 Fibromyalgia: Secondary | ICD-10-CM

## 2015-09-19 DIAGNOSIS — Z9889 Other specified postprocedural states: Secondary | ICD-10-CM | POA: Diagnosis not present

## 2015-09-19 DIAGNOSIS — M79605 Pain in left leg: Secondary | ICD-10-CM | POA: Diagnosis present

## 2015-09-19 DIAGNOSIS — S92901A Unspecified fracture of right foot, initial encounter for closed fracture: Secondary | ICD-10-CM

## 2015-09-19 DIAGNOSIS — M47817 Spondylosis without myelopathy or radiculopathy, lumbosacral region: Secondary | ICD-10-CM | POA: Diagnosis not present

## 2015-09-19 DIAGNOSIS — M47816 Spondylosis without myelopathy or radiculopathy, lumbar region: Secondary | ICD-10-CM

## 2015-09-19 DIAGNOSIS — M5416 Radiculopathy, lumbar region: Secondary | ICD-10-CM | POA: Diagnosis not present

## 2015-09-19 DIAGNOSIS — M791 Myalgia: Secondary | ICD-10-CM | POA: Diagnosis not present

## 2015-09-19 DIAGNOSIS — M19011 Primary osteoarthritis, right shoulder: Secondary | ICD-10-CM

## 2015-09-19 MED ORDER — HYDROCODONE-ACETAMINOPHEN 5-325 MG PO TABS: ORAL_TABLET | ORAL | Status: DC

## 2015-09-19 MED ORDER — DIAZEPAM 5 MG PO TABS: ORAL_TABLET | ORAL | Status: DC

## 2015-09-19 NOTE — Progress Notes (Signed)
Safety precautions to be maintained throughout the outpatient stay will include: orient to surroundings, keep bed in low position, maintain call bell within reach at all times, provide assistance with transfer out of bed and ambulation.  

## 2015-09-19 NOTE — Progress Notes (Signed)
   Subjective:    Patient ID: Carrie Myers, female    DOB: [birth year], 70 y.o.   MRN: [id number]  HPI   The patient is a 70 year old gentleman who returns to pain management for further evaluation and treatment of pain involving the region of the lumbar and lower extremity regions.. The patient is status post surgery of the shoulder and has been able to swim and perform of activities without any significant pain. The patient is without significant pain involving the neck mid back lower back lower extremity regions. We will continue patient's hydrocodone acetaminophen and patient will call pain management should there be any change in condition or have other concerns regarding condition prior to scheduled return appointment. All agreed to suggested treatment plan  Review of Systems     Objective:   Physical Exam  There was tenderness of the splenius capitis and occipitalis region a moderate degree with moderate tenderness over the cervical facet cervical paraspinal musculature region. Palpation of the acromioclavicular and glenohumeral joint regions reproduced pain reproduced pain of moderate discomfort. The patient was able to perform drop test without significant difficulty. There was tenderness of the thoracic region without crepitus of the thoracic region with tenderness over the lumbar region lumbar facet region reproducing mild to moderate discomfort. Lateral bending rotation extension and palpation over the lumbar facets reproduce mild to moderate discomfort as well. Palpation of the PSIS and PII S region reproduced pain of moderate degree Straight leg raising was tolerated to 30 without increased pain with dorsiflexion noted. DTRs appeared to be trace at the knees. There was no sensory deficit or dermatomal distribution detected. There was negative clonus negative Homans. Abdomen nontender with no costovertebral tenderness noted    Assessment & Plan:     Degenerative disc disease  lumbar spine  Lumbar facet syndrome  Sacroiliac joint dysfunction  Degenerative joint disease of shoulder 1. Mild to moderate supraspinatus tendinopathy and small shoulder joint effusion. 2. Moderate degenerative glenohumeral arthropathy and mild degenerative AC joint arthropathy.  Status post surgery of shoulder      PLAN  Continue present medication Valium and hydrocodone acetaminophen . Must decrease and discontinue Valium as discussed  F/U PCP Dr.Berglund  for evaliation of  BP and general medical  condition.  F/U surgical evaluation with Dr. Roland Rack  F/U neurological evaluation. May consider pending follow-up evaluations  Exercise with caution to avoid aggravation of shoulder and other symptoms  May consider radiofrequency rhizolysis or intraspinal procedures pending response to present treatment and F/U evaluation.  Patient to call Pain Management Center should patient have concerns prior to scheduled return appointment.

## 2015-09-19 NOTE — Patient Instructions (Signed)
PLAN  Continue present medication Valium and hydrocodone acetaminophen . Must decrease and discontinue Valium as discussed  F/U PCP Dr.Berglund  for evaliation of  BP and general medical  condition.  F/U surgical evaluation with Dr. Roland Rack  F/U neurological evaluation. May consider pending follow-up evaluations  Exercise with caution to avoid aggravation of shoulder and other symptoms  May consider radiofrequency rhizolysis or intraspinal procedures pending response to present treatment and F/U evaluation.  Patient to call Pain Management Center should patient have concerns prior to scheduled return appointment.

## 2015-10-09 ENCOUNTER — Encounter: Payer: Self-pay | Admitting: Internal Medicine

## 2015-10-10 ENCOUNTER — Encounter: Payer: Self-pay | Admitting: Internal Medicine

## 2015-10-10 ENCOUNTER — Ambulatory Visit (INDEPENDENT_AMBULATORY_CARE_PROVIDER_SITE_OTHER): Payer: Medicare Other | Admitting: Internal Medicine

## 2015-10-10 VITALS — BP 118/78 | HR 97 | Resp 16 | Ht 68.0 in | Wt 176.0 lb

## 2015-10-10 DIAGNOSIS — Z1239 Encounter for other screening for malignant neoplasm of breast: Secondary | ICD-10-CM | POA: Diagnosis not present

## 2015-10-10 DIAGNOSIS — E890 Postprocedural hypothyroidism: Secondary | ICD-10-CM | POA: Diagnosis not present

## 2015-10-10 DIAGNOSIS — M797 Fibromyalgia: Secondary | ICD-10-CM

## 2015-10-10 DIAGNOSIS — E785 Hyperlipidemia, unspecified: Secondary | ICD-10-CM

## 2015-10-10 DIAGNOSIS — I10 Essential (primary) hypertension: Secondary | ICD-10-CM

## 2015-10-10 MED ORDER — DICLOFENAC-MISOPROSTOL 75-0.2 MG PO TBEC: 1.0000 | DELAYED_RELEASE_TABLET | Freq: Two times a day (BID) | ORAL | 1 refills | Status: DC

## 2015-10-10 MED ORDER — CITALOPRAM HYDROBROMIDE 20 MG PO TABS: 20.0000 mg | ORAL_TABLET | Freq: Every day | ORAL | 1 refills | Status: DC

## 2015-10-10 NOTE — Patient Instructions (Signed)
Breast Self-Awareness Practicing breast self-awareness may pick up problems early, prevent significant medical complications, and possibly save your life. By practicing breast self-awareness, you can become familiar with how your breasts look and feel and if your breasts are changing. This allows you to notice changes early. It can also offer you some reassurance that your breast health is good. One way to learn what is normal for your breasts and whether your breasts are changing is to do a breast self-exam. If you find a lump or something that was not present in the past, it is best to contact your caregiver right away. Other findings that should be evaluated by your caregiver include nipple discharge, especially if it is bloody; skin changes or reddening; areas where the skin seems to be pulled in (retracted); or new lumps and bumps. Breast pain is seldom associated with cancer (malignancy), but should also be evaluated by a caregiver. HOW TO PERFORM A BREAST SELF-EXAM The best time to examine your breasts is 5-7 days after your menstrual period is over. During menstruation, the breasts are lumpier, and it may be more difficult to pick up changes. If you do not menstruate, have reached menopause, or had your uterus removed (hysterectomy), you should examine your breasts at regular intervals, such as monthly. If you are breastfeeding, examine your breasts after a feeding or after using a breast pump. Breast implants do not decrease the risk for lumps or tumors, so continue to perform breast self-exams as recommended. Talk to your caregiver about how to determine the difference between the implant and breast tissue. Also, talk about the amount of pressure you should use during the exam. Over time, you will become more familiar with the variations of your breasts and more comfortable with the exam. A breast self-exam requires you to remove all your clothes above the waist. 1. Look at your breasts and nipples.  Stand in front of a mirror in a room with good lighting. With your hands on your hips, push your hands firmly downward. Look for a difference in shape, contour, and size from one breast to the other (asymmetry). Asymmetry includes puckers, dips, or bumps. Also, look for skin changes, such as reddened or scaly areas on the breasts. Look for nipple changes, such as discharge, dimpling, repositioning, or redness. 2. Carefully feel your breasts. This is best done either in the shower or tub while using soapy water or when flat on your back. Place the arm (on the side of the breast you are examining) above your head. Use the pads (not the fingertips) of your three middle fingers on your opposite hand to feel your breasts. Start in the underarm area and use  inch (2 cm) overlapping circles to feel your breast. Use 3 different levels of pressure (light, medium, and firm pressure) at each circle before moving to the next circle. The light pressure is needed to feel the tissue closest to the skin. The medium pressure will help to feel breast tissue a little deeper, while the firm pressure is needed to feel the tissue close to the ribs. Continue the overlapping circles, moving downward over the breast until you feel your ribs below your breast. Then, move one finger-width towards the center of the body. Continue to use the  inch (2 cm) overlapping circles to feel your breast as you move slowly up toward the collar bone (clavicle) near the base of the neck. Continue the up and down exam using all 3 pressures until you reach the   middle of the chest. Do this with each breast, carefully feeling for lumps or changes. 3.  Keep a written record with breast changes or normal findings for each breast. By writing this information down, you do not need to depend only on memory for size, tenderness, or location. Write down where you are in your menstrual cycle, if you are still menstruating. Breast tissue can have some lumps or  thick tissue. However, see your caregiver if you find anything that concerns you.  SEEK MEDICAL CARE IF:  You see a change in shape, contour, or size of your breasts or nipples.   You see skin changes, such as reddened or scaly areas on the breasts or nipples.   You have an unusual discharge from your nipples.   You feel a new lump or unusually thick areas.    This information is not intended to replace advice given to you by your health care provider. Make sure you discuss any questions you have with your health care provider.   Document Released: 03/01/2005 Document Revised: 02/16/2012 Document Reviewed: 06/16/2011 Elsevier Interactive Patient Education 2016 [employer]vier Inc[employer]  

## 2015-10-10 NOTE — Progress Notes (Signed)
Date:  10/10/2015   Name:  Carrie Myers   DOB:  1946/02/02   MRN:  [id number]   Chief Complaint: Hyperlipidemia and Hypertension Hyperlipidemia  This is a chronic problem. The current episode started more than 1 year ago. Associated symptoms include myalgias. Pertinent negatives include no chest pain or shortness of breath. Current antihyperlipidemic treatment includes statins. The current treatment provides moderate improvement of lipids.  Hypertension  This is a chronic problem. The current episode started more than 1 year ago. The problem is controlled. Pertinent negatives include no chest pain, headaches, palpitations or shortness of breath.  Fibromyalgia - had been going to Dr. Sunday Shams.  She is on tramadol and arthrotec as well as citalpram.  She wants to come here for PCP which Dr. Sunday Shams has been doing. CAD - followed by Cardiology. Back pain - followed by Dr. Primus Bravo - he prescribes Norco and valium.  We discussed asking him to also prescribe the Tramadol.  Review of Systems  Constitutional: Negative for unexpected weight change.  HENT: Negative for tinnitus, trouble swallowing and voice change.   Respiratory: Negative for chest tightness, shortness of breath and wheezing.   Cardiovascular: Negative for chest pain, palpitations and leg swelling.  Gastrointestinal: Negative for abdominal pain and blood in stool.  Genitourinary: Negative for dysuria and frequency.  Musculoskeletal: Positive for arthralgias, back pain and myalgias.  Skin: Negative for rash.  Neurological: Negative for dizziness, tremors, weakness and headaches.  Hematological: Negative for adenopathy.  Psychiatric/Behavioral: Positive for sleep disturbance. Negative for decreased concentration, dysphoric mood and hallucinations.    Patient Active Problem List   Diagnosis Date Noted  . Status post shoulder surgery 05/27/2015  . Myocardial infarction ([place]) 05/20/2015  . Glenoid labral tear 05/09/2015  .  Impingement syndrome of right shoulder 04/17/2015  . Other shoulder lesions, right shoulder 04/17/2015  . CAD in native artery 01/18/2015  . Fibrocystic breast 01/18/2015  . Acid reflux 01/18/2015  . Hypothyroidism, postablative 01/18/2015  . Fracture of right foot 01/14/2015  . Arteriosclerosis of coronary artery 12/29/2014  . DJD of shoulder 11/13/2014  . Fibromyalgia 10/02/2014  . Intercostal neuralgia 10/02/2014  . DDD (degenerative disc disease), lumbar 10/02/2014  . Facet syndrome, lumbar 10/02/2014  . Sacroiliac joint disease 10/02/2014  . Carotid artery narrowing 07/09/2014  . Benign essential HTN 07/01/2014  . HLD (hyperlipidemia) 09/22/2012  . Arthritis of hand, degenerative 09/22/2012    Prior to Admission medications   Medication Sig Start Date End Date Taking? Authorizing Provider  aspirin EC 81 MG tablet Take by mouth.   Yes Historical Provider, MD  diazepam (VALIUM) 5 MG tablet Limit  1/2  tablet by mouth per day or every other day if tolerated 09/19/15  Yes Mohammed Kindle, MD  Diclofenac-Misoprostol 75-0.2 MG TBEC  07/17/13  Yes Historical Provider, MD  esomeprazole (NEXIUM) 20 MG capsule Take 40 mg by mouth daily. Over the Counter   Yes Historical Provider, MD  Eszopiclone 3 MG TABS  09/14/13  Yes Historical Provider, MD  HYDROcodone-acetaminophen (NORCO/VICODIN) 5-325 MG tablet Limit 3-6 tablets by mouth per day if tolerated 09/19/15  Yes Mohammed Kindle, MD  levothyroxine (SYNTHROID, LEVOTHROID) 88 MCG tablet Take 1 tablet by mouth  daily 05/21/15  Yes [name], MD  losartan (COZAAR) 50 MG tablet Take 50 mg by mouth daily.   Yes Historical Provider, MD  pravastatin (PRAVACHOL) 80 MG tablet Take 80 mg by mouth daily.   Yes Historical Provider, MD  traMADol Veatrice Bourbon) 50  MG tablet Take 50 mg by mouth 2 (two) times daily.    Yes Historical Provider, MD    Allergies  Allergen Reactions  . Augmentin [Amoxicillin-Pot Clavulanate] Nausea And Vomiting  . Codeine Nausea And  Vomiting  . Sulfa Antibiotics Nausea And Vomiting  . Shellfish Allergy Nausea And Vomiting, Nausea Only and Other (See Comments)    No issues with iodine     Past Surgical History:  Procedure Laterality Date  . ABDOMINAL HYSTERECTOMY     total  . APPENDECTOMY    . BUNIONECTOMY Bilateral   . CESAREAN SECTION    . COLONOSCOPY  2010   normal  . CORONARY ANGIOPLASTY WITH STENT PLACEMENT  04/2006  . dental implant     [age]  . SHOULDER ARTHROSCOPY Right 05/07/2015   Procedure: Extensive arthroscopic debridement and arthroscopic subacromial decompression, right shoulder.  2. Steroid injection right thumb CMC joint.;  Surgeon: Corky Mull, MD;  Location: [facility];  Service: Orthopedics;  Laterality: Right;    Social History  Substance Use Topics  . Smoking status: Former Smoker    Types: Cigarettes  . Smokeless tobacco: Former Systems developer    Quit date: 04/22/2006  . Alcohol use No     Medication list has been reviewed and updated.   Physical Exam  Constitutional: She is oriented to person, place, and time. She appears well-developed. No distress.  HENT:  Head: Normocephalic and atraumatic.  Neck: Normal range of motion. No thyromegaly present.  Cardiovascular: Normal rate, regular rhythm and normal heart sounds.   Pulmonary/Chest: Effort normal and breath sounds normal. No respiratory distress.  Musculoskeletal: She exhibits tenderness. She exhibits no edema.  Tender over upper back, SI region, lateral hips and lower legs  Lymphadenopathy:    She has no cervical adenopathy.  Neurological: She is alert and oriented to person, place, and time.  Skin: Skin is warm and dry. No rash noted.  Psychiatric: She has a normal mood and affect. Her speech is normal and behavior is normal. Thought content normal. Cognition and memory are normal.  Nursing note and vitals reviewed.   BP 118/78 (BP Location: Right Arm, Patient Position: Sitting, Cuff Size: Normal)    Pulse 97   Resp 16   Ht 5\' 8"  (1.727 m)   Wt 176 lb (79.8 kg)   SpO2 99%   BMI 26.76 kg/m   Assessment and Plan: 1. Benign essential HTN Controlled; followed by Cardiology  2. HLD (hyperlipidemia) meds prescribed by Cardiology  3. Hypothyroidism, postablative supplemented  4. Fibromyalgia Will prescribe the meds below but explained that I can not prescribe Tramadol since it is controlled and for chronic pain management - should discuss this with Dr. Primus Bravo - citalopram (CELEXA) 20 MG tablet; Take 1 tablet (20 mg total) by mouth daily.  Dispense: 90 tablet; Refill: 1 - Diclofenac-Misoprostol 75-0.2 MG TBEC; Take 1 tablet by mouth 2 (two) times daily.  Dispense: 180 tablet; Refill: 1  5. Breast cancer screening Orders placed - patient will schedule   Halina Maidens, MD [facility]  10/10/2015

## 2015-10-14 ENCOUNTER — Encounter: Payer: Self-pay | Admitting: Pain Medicine

## 2015-10-14 ENCOUNTER — Ambulatory Visit: Payer: Medicare Other | Attending: Pain Medicine | Admitting: Pain Medicine

## 2015-10-14 VITALS — BP 125/81 | HR 86 | Temp 98.3°F | Resp 16 | Ht 68.0 in | Wt 180.0 lb

## 2015-10-14 DIAGNOSIS — M25419 Effusion, unspecified shoulder: Secondary | ICD-10-CM | POA: Insufficient documentation

## 2015-10-14 DIAGNOSIS — G588 Other specified mononeuropathies: Secondary | ICD-10-CM

## 2015-10-14 DIAGNOSIS — Z9889 Other specified postprocedural states: Secondary | ICD-10-CM | POA: Diagnosis not present

## 2015-10-14 DIAGNOSIS — M5136 Other intervertebral disc degeneration, lumbar region: Secondary | ICD-10-CM | POA: Insufficient documentation

## 2015-10-14 DIAGNOSIS — M19011 Primary osteoarthritis, right shoulder: Secondary | ICD-10-CM

## 2015-10-14 DIAGNOSIS — M542 Cervicalgia: Secondary | ICD-10-CM | POA: Diagnosis present

## 2015-10-14 DIAGNOSIS — M47817 Spondylosis without myelopathy or radiculopathy, lumbosacral region: Secondary | ICD-10-CM | POA: Diagnosis not present

## 2015-10-14 DIAGNOSIS — M19019 Primary osteoarthritis, unspecified shoulder: Secondary | ICD-10-CM | POA: Insufficient documentation

## 2015-10-14 DIAGNOSIS — M791 Myalgia: Secondary | ICD-10-CM | POA: Diagnosis not present

## 2015-10-14 DIAGNOSIS — S92901D Unspecified fracture of right foot, subsequent encounter for fracture with routine healing: Secondary | ICD-10-CM

## 2015-10-14 DIAGNOSIS — M533 Sacrococcygeal disorders, not elsewhere classified: Secondary | ICD-10-CM | POA: Diagnosis not present

## 2015-10-14 DIAGNOSIS — M47816 Spondylosis without myelopathy or radiculopathy, lumbar region: Secondary | ICD-10-CM

## 2015-10-14 DIAGNOSIS — M5416 Radiculopathy, lumbar region: Secondary | ICD-10-CM | POA: Diagnosis not present

## 2015-10-14 DIAGNOSIS — M19012 Primary osteoarthritis, left shoulder: Secondary | ICD-10-CM

## 2015-10-14 MED ORDER — DIAZEPAM 5 MG PO TABS: ORAL_TABLET | ORAL | 0 refills | Status: DC

## 2015-10-14 MED ORDER — HYDROCODONE-ACETAMINOPHEN 5-325 MG PO TABS: ORAL_TABLET | ORAL | 0 refills | Status: DC

## 2015-10-14 MED ORDER — TRAMADOL HCL 50 MG PO TABS: ORAL_TABLET | ORAL | Status: DC

## 2015-10-14 NOTE — Progress Notes (Signed)
    The patient is a 70 year old female who returns to pain management for further evaluation and treatment of pain involving the region of the neck shoulders into back upper and lower extremity regions. The patient is with surgical intervention of the shoulder with significant improvement of pain involving the shoulder. The patient is status post surgery of the shoulder by Dr.Poggi . The patient has engaged in swimming and has significantly improved range of motion of the shoulder without aggravation of pain with range of motion maneuvers of the shoulder being performed. The patient continues medications without excessive drowsiness confusion and other side effects. We discussed patient's overall condition and we will continue presently prescribed medications and patient is to decrease and discontinue the use of Valium over gradual period of time. The patient was with understanding and agreed to suggested treatment plan. We will continue patient's tramadol hydrocodone acetaminophen and we we'll continue tapering the use of Valium as discussed. All agreed to suggested treatment plan.     Physical examination  There was tenderness of the splenius capitis and occipitalis region palpation which reproduces mild to moderate discomfort. Palpation of the acromioclavicular and glenohumeral joint regions reproduce mild discomfort and patient was able to perform drop test without significant difficulty.  The patient appeared to be with bilaterally equal grip strength without increase of pain of significant degree with Tinel and Phalen's maneuver. Palpation over the region of the thoracic region was attends to palpation with crepitus of the thoracic region. Palpation over the lumbar region was with tenderness to palpation with lateral bending rotation extension and palpation over the lumbar facets reproducing mild to moderate discomfort. There was mild to moderate tenderness over the greater trochanteric region  iliotibial band region as well as the PSIS and PII S regions. Straight leg raise was tolerates approximately 30 without a definite increase of pain with dorsiflexion noted. There was negative clonus negative Homans. The knees were with crepitus of the knees with negative anterior and posterior drawer signs. Abdomen was nontender with no costovertebral tenderness noted.      Assessment    Degenerative disc disease lumbar spine  Lumbar facet syndrome  Sacroiliac joint dysfunction  Degenerative joint disease of shoulder 1. Mild to moderate supraspinatus tendinopathy and small shoulder joint effusion. 2. Moderate degenerative glenohumeral arthropathy and mild degenerative AC joint arthropathy.  Status post surgery of shoulder   PLAN  Continue present medications tramadol, hydrocodone acetaminophen, and Valium .Must decrease and discontinue Valium as discussed    CAUTION    Medications can cause respiratory depression and cause you to stop breathing, cause excessive sedation, cause confusion and other side effects.  Exercise extreme caution when taking medication and call EMS or go to the Emergency Department immediately if you develop any of these symptoms   F/U PCP Dr.Berglund  for evaliation of  BP and general medical  condition.  F/U surgical evaluation with Dr. Roland Rack  F/U neurological evaluation. May consider pending follow-up evaluations  Exercise with caution to avoid aggravation of shoulder and other symptoms  May consider radiofrequency rhizolysis or intraspinal procedures pending response to present treatment and F/U evaluation.  Patient to call Pain Management Center should patient have concerns prior to scheduled return appointment.

## 2015-10-14 NOTE — Patient Instructions (Addendum)
PLAN  Continue present medications tramadol, hydrocodone acetaminophen, and Valium . Must decrease and discontinue Valium as discussed    CAUTION    Medications can cause respiratory depression and cause you to stop breathing, cause excessive sedation, cause confusion and other side effects.  Exercise extreme caution when taking medication and call EMS or go to the Emergency Department immediately if you develop any of these symptoms   F/U PCP Dr.Berglund  for evaliation of  BP and general medical  condition.  F/U surgical evaluation with Dr. Roland Rack  F/U neurological evaluation. May consider pending follow-up evaluations  Exercise with caution to avoid aggravation of shoulder and other symptoms  May consider radiofrequency rhizolysis or intraspinal procedures pending response to present treatment and F/U evaluation.  Patient to call Pain Management Center should patient have concerns prior to scheduled return appointment.

## 2015-10-16 ENCOUNTER — Encounter: Payer: Medicare Other | Admitting: Pain Medicine

## 2015-10-28 DIAGNOSIS — I2510 Atherosclerotic heart disease of native coronary artery without angina pectoris: Secondary | ICD-10-CM | POA: Diagnosis not present

## 2015-10-28 DIAGNOSIS — I6523 Occlusion and stenosis of bilateral carotid arteries: Secondary | ICD-10-CM | POA: Diagnosis not present

## 2015-10-28 DIAGNOSIS — I10 Essential (primary) hypertension: Secondary | ICD-10-CM | POA: Diagnosis not present

## 2015-10-28 DIAGNOSIS — K219 Gastro-esophageal reflux disease without esophagitis: Secondary | ICD-10-CM | POA: Diagnosis not present

## 2015-11-11 ENCOUNTER — Encounter: Payer: Self-pay | Admitting: Pain Medicine

## 2015-11-11 ENCOUNTER — Ambulatory Visit: Payer: Medicare Other | Attending: Pain Medicine | Admitting: Pain Medicine

## 2015-11-11 VITALS — BP 149/89 | HR 79 | Temp 98.1°F | Resp 16 | Ht 68.0 in | Wt 180.0 lb

## 2015-11-11 DIAGNOSIS — M79604 Pain in right leg: Secondary | ICD-10-CM | POA: Diagnosis present

## 2015-11-11 DIAGNOSIS — M5136 Other intervertebral disc degeneration, lumbar region: Secondary | ICD-10-CM | POA: Diagnosis not present

## 2015-11-11 DIAGNOSIS — M62830 Muscle spasm of back: Secondary | ICD-10-CM | POA: Insufficient documentation

## 2015-11-11 DIAGNOSIS — Z9889 Other specified postprocedural states: Secondary | ICD-10-CM | POA: Insufficient documentation

## 2015-11-11 DIAGNOSIS — M19042 Primary osteoarthritis, left hand: Secondary | ICD-10-CM

## 2015-11-11 DIAGNOSIS — M12819 Other specific arthropathies, not elsewhere classified, unspecified shoulder: Secondary | ICD-10-CM | POA: Diagnosis not present

## 2015-11-11 DIAGNOSIS — M797 Fibromyalgia: Secondary | ICD-10-CM

## 2015-11-11 DIAGNOSIS — M25419 Effusion, unspecified shoulder: Secondary | ICD-10-CM | POA: Diagnosis not present

## 2015-11-11 DIAGNOSIS — M19041 Primary osteoarthritis, right hand: Secondary | ICD-10-CM

## 2015-11-11 DIAGNOSIS — G588 Other specified mononeuropathies: Secondary | ICD-10-CM

## 2015-11-11 DIAGNOSIS — M546 Pain in thoracic spine: Secondary | ICD-10-CM | POA: Diagnosis present

## 2015-11-11 DIAGNOSIS — M47817 Spondylosis without myelopathy or radiculopathy, lumbosacral region: Secondary | ICD-10-CM | POA: Diagnosis not present

## 2015-11-11 DIAGNOSIS — M533 Sacrococcygeal disorders, not elsewhere classified: Secondary | ICD-10-CM | POA: Insufficient documentation

## 2015-11-11 DIAGNOSIS — S92901D Unspecified fracture of right foot, subsequent encounter for fracture with routine healing: Secondary | ICD-10-CM

## 2015-11-11 DIAGNOSIS — M19019 Primary osteoarthritis, unspecified shoulder: Secondary | ICD-10-CM | POA: Diagnosis not present

## 2015-11-11 DIAGNOSIS — M19011 Primary osteoarthritis, right shoulder: Secondary | ICD-10-CM

## 2015-11-11 DIAGNOSIS — M79605 Pain in left leg: Secondary | ICD-10-CM | POA: Diagnosis present

## 2015-11-11 DIAGNOSIS — S43431D Superior glenoid labrum lesion of right shoulder, subsequent encounter: Secondary | ICD-10-CM

## 2015-11-11 DIAGNOSIS — M19012 Primary osteoarthritis, left shoulder: Secondary | ICD-10-CM

## 2015-11-11 DIAGNOSIS — M791 Myalgia: Secondary | ICD-10-CM | POA: Diagnosis not present

## 2015-11-11 DIAGNOSIS — M5416 Radiculopathy, lumbar region: Secondary | ICD-10-CM | POA: Diagnosis not present

## 2015-11-11 MED ORDER — HYDROCODONE-ACETAMINOPHEN 5-325 MG PO TABS: ORAL_TABLET | ORAL | 0 refills | Status: DC

## 2015-11-11 MED ORDER — DIAZEPAM 5 MG PO TABS: ORAL_TABLET | ORAL | 0 refills | Status: DC

## 2015-11-11 NOTE — Progress Notes (Signed)
The patient is a 70 year old female who returns to pain management for further evaluation and treatment of pain involving the neck shoulders entire back upper and lower extremity regions. The patient states that overall the pain is well-controlled at this time. The patient is status post surgery of the shoulder by Dr.Poggi it appears to be with pain of the shoulder well-controlled at this time. The patient states that the lower back lower extremity pain is also fairly well-controlled. The patient is with pain occurring at the base of the right thumb which interferes with activities of daily living. We discussed patient's condition and will consider interventional treatment in the region of the thumb should patient symptoms persist and patient wished to proceed with such treatment. At the present time we will continue presently prescribed medications and we will remain available to consider modification of treatment regimen pending follow-up evaluation. We also can palpation consider topical application of medications to the area of the hand to determine if this will be beneficial to the patient in addition to consider interventional treatment. At the present time we'll continue hydrocodone acetaminophen and diazepam. The patient denies trauma change in events of daily living the call significant change in symptomatology.     Physical examination   There was tenderness to palpation of the paraspinal muscular treat the cervical region cervical facet region of mild to moderate degree with mild to moderate tenderness of the splenius capitis and occipitalis region. There was unremarkable Spurling's maneuver and palpation of the acromioclavicular and glenohumeral joint regions reproduces minimal discomfort. Palpation of the thoracic region was attends to palpation in moderate muscle spasm with no crepitus of the thoracic region noted. The patient appeared to be with decreased grip strength on the right  compared to the left with tenderness to palpation at the base of the right thumb with no increased warmth and erythema in the region of the thumb. Tinel and Phalen's maneuver reproduces moderate discomfort. There was tenderness over the lumbar paraspinal musculature region lumbar facet region palpation which reproduces pain of moderate degree with lateral bending rotation extension and palpation over the lumbar facets reproducing moderate discomfort. Palpation over the PSIS and PII S region reproduced mild to moderate discomfort with moderate tenderness to palpation of the greater trochanteric region iliotibial band region. Straight leg raise was tolerated to 30 without increased pain with dorsiflexion noted. No definite sensory deficit or dermatomal distribution detected. There was negative clonus negative Homans. Abdomen without excessive tenderness to palpation and no costovertebral angle tenderness noted     Assessment    Degenerative disc disease lumbar spine  Lumbar facet syndrome  Sacroiliac joint dysfunction  Degenerative joint disease of shoulder 1. Mild to moderate supraspinatus tendinopathy and small shoulder joint effusion. 2. Moderate degenerative glenohumeral arthropathy and mild degenerative AC joint arthropathy.  Status post surgery of shoulder      PLAN  Continue present medications tramadol, hydrocodone acetaminophen, and Valium . Must decrease and discontinue Valium as discussed    CAUTION    Medications can cause respiratory depression and cause you to stop breathing, cause excessive sedation, cause confusion and other side effects.  Exercise extreme caution when taking medication and call EMS or go to the Emergency Department immediately if you develop any of these symptoms   We will consider injection of the thumb as discussed  F/U PCP Dr.Berglund  for evaliation of  BP and general medical  condition.  F/U surgical evaluation with Dr. Roland Rack status post  surgery  of shoulder as needed  F/U neurological evaluation. May consider pending follow-up evaluations. We will avoid such studies at this time  Exercise with caution to avoid aggravation of shoulder and other symptoms  May consider radiofrequency rhizolysis or intraspinal procedures pending response to present treatment and F/U evaluation.  Patient to call Pain Management Center should patient have concerns prior to scheduled return appointment.

## 2015-11-11 NOTE — Progress Notes (Signed)
Safety precautions to be maintained throughout the outpatient stay will include: orient to surroundings, keep bed in low position, maintain call bell within reach at all times, provide assistance with transfer out of bed and ambulation.  

## 2015-11-11 NOTE — Patient Instructions (Addendum)
PLAN  Continue present medications tramadol, hydrocodone acetaminophen, and Valium . Must decrease and discontinue Valium as discussed    CAUTION    Medications can cause respiratory depression and cause you to stop breathing, cause excessive sedation, cause confusion and other side effects.  Exercise extreme caution when taking medication and call EMS or go to the Emergency Department immediately if you develop any of these symptoms   We will consider injection of the thumb as discussed  F/U PCP Dr.Berglund  for evaliation of  BP and general medical  condition.  F/U surgical evaluation with Dr. Roland Rack status post surgery of shoulder as needed  F/U neurological evaluation. May consider pending follow-up evaluations. We will avoid such studies at this time  Exercise with caution to avoid aggravation of shoulder and other symptoms  May consider radiofrequency rhizolysis or intraspinal procedures pending response to present treatment and F/U evaluation.  Patient to call Pain Management Center should patient have concerns prior to scheduled return appointment.

## 2015-11-12 ENCOUNTER — Telehealth: Payer: Self-pay

## 2015-11-12 ENCOUNTER — Other Ambulatory Visit: Payer: Self-pay | Admitting: Pain Medicine

## 2015-11-12 MED ORDER — TRAMADOL HCL 50 MG PO TABS: ORAL_TABLET | ORAL | Status: DC

## 2015-11-12 NOTE — Telephone Encounter (Signed)
Dr. Primus Bravo notified, Tramadol called in.

## 2015-11-12 NOTE — Telephone Encounter (Signed)
Pt says Dr Primus Bravo did not give her a prescription for tramadol and she needs it.

## 2015-12-02 ENCOUNTER — Encounter: Payer: Self-pay | Admitting: Internal Medicine

## 2015-12-02 ENCOUNTER — Ambulatory Visit (INDEPENDENT_AMBULATORY_CARE_PROVIDER_SITE_OTHER): Payer: Medicare Other | Admitting: Internal Medicine

## 2015-12-02 VITALS — BP 128/82 | HR 95 | Temp 98.4°F | Resp 16 | Ht 68.0 in | Wt 173.0 lb

## 2015-12-02 DIAGNOSIS — J0100 Acute maxillary sinusitis, unspecified: Secondary | ICD-10-CM

## 2015-12-02 MED ORDER — LEVOFLOXACIN 500 MG PO TABS: 500.0000 mg | ORAL_TABLET | Freq: Every day | ORAL | 0 refills | Status: DC

## 2015-12-02 NOTE — Patient Instructions (Signed)

## 2015-12-02 NOTE — Progress Notes (Signed)
Date:  12/02/2015   Name:  Carrie Myers   DOB:  04-12-1945   MRN:  [id number]   Chief Complaint: Sinusitis Sinusitis  This is a new problem. The current episode started in the past 7 days. The problem has been gradually worsening since onset. There has been no fever. The pain is moderate. Associated symptoms include coughing, sinus pressure, a sore throat and swollen glands. Pertinent negatives include no chills, ear pain or headaches. Past treatments include oral decongestants. The treatment provided mild relief.      Review of Systems  Constitutional: Negative for chills, fatigue and fever.  HENT: Positive for sinus pressure and sore throat. Negative for ear pain, trouble swallowing and voice change.   Respiratory: Positive for cough and chest tightness.   Cardiovascular: Negative for chest pain and palpitations.  Gastrointestinal: Positive for nausea. Negative for abdominal pain.  Neurological: Negative for dizziness and headaches.  Hematological: Negative for adenopathy.    Patient Active Problem List   Diagnosis Date Noted  . Status post shoulder surgery 05/27/2015  . Myocardial infarction ([place]) 05/20/2015  . Glenoid labral tear 05/09/2015  . Impingement syndrome of right shoulder 04/17/2015  . Other shoulder lesions, right shoulder 04/17/2015  . CAD in native artery 01/18/2015  . Fibrocystic breast 01/18/2015  . Gastroesophageal reflux disease without esophagitis 01/18/2015  . Hypothyroidism, postablative 01/18/2015  . Fracture of right foot 01/14/2015  . Arteriosclerosis of coronary artery 12/29/2014  . DJD of shoulder 11/13/2014  . Fibromyalgia 10/02/2014  . Intercostal neuralgia 10/02/2014  . DDD (degenerative disc disease), lumbar 10/02/2014  . Facet syndrome, lumbar 10/02/2014  . Sacroiliac joint disease 10/02/2014  . Carotid artery narrowing 07/09/2014  . Benign essential HTN 07/01/2014  . HLD (hyperlipidemia) 09/22/2012  . Arthritis of hand,  degenerative 09/22/2012    Prior to Admission medications   Medication Sig Start Date End Date Taking? Authorizing Provider  citalopram (CELEXA) 20 MG tablet Take 1 tablet (20 mg total) by mouth daily. 10/10/15   [name], MD  diazepam (VALIUM) 5 MG tablet Limit  1/2  tablet by mouth per day or every other day if tolerated 11/11/15   Mohammed Kindle, MD  Diclofenac-Misoprostol 75-0.2 MG TBEC Take 1 tablet by mouth 2 (two) times daily. 10/10/15   [name], MD  esomeprazole (NEXIUM) 20 MG capsule Take 40 mg by mouth daily. Over the Counter    Historical Provider, MD  HYDROcodone-acetaminophen (NORCO/VICODIN) 5-325 MG tablet Limit 3-6 tablets by mouth per day if tolerated 11/11/15   Mohammed Kindle, MD  levothyroxine (SYNTHROID, LEVOTHROID) 88 MCG tablet Take 1 tablet by mouth  daily 05/21/15   [name], MD  losartan (COZAAR) 50 MG tablet Take 50 mg by mouth daily.    Historical Provider, MD  pravastatin (PRAVACHOL) 80 MG tablet Take 80 mg by mouth daily.    Historical Provider, MD  traMADol (ULTRAM) 50 MG tablet Limit 1 tablet by mouth per day or twice per day for breakthrough pain while taking hydrocodone acetaminophen if tolerated 11/12/15   Mohammed Kindle, MD    Allergies  Allergen Reactions  . Augmentin [Amoxicillin-Pot Clavulanate] Nausea And Vomiting  . Codeine Nausea And Vomiting  . Sulfa Antibiotics Nausea And Vomiting  . Shellfish Allergy Nausea And Vomiting, Nausea Only and Other (See Comments)    No issues with iodine     Past Surgical History:  Procedure Laterality Date  . ABDOMINAL HYSTERECTOMY     total  . APPENDECTOMY    .  BUNIONECTOMY Bilateral   . CESAREAN SECTION    . COLONOSCOPY  2010   normal  . CORONARY ANGIOPLASTY WITH STENT PLACEMENT  04/2006  . dental implant     [age]  . SHOULDER ARTHROSCOPY Right 05/07/2015   Procedure: Extensive arthroscopic debridement and arthroscopic subacromial decompression, right shoulder.  2.  Steroid injection right thumb CMC joint.;  Surgeon: Corky Mull, MD;  Location: [facility];  Service: Orthopedics;  Laterality: Right;    Social History  Substance Use Topics  . Smoking status: Former Smoker    Types: Cigarettes  . Smokeless tobacco: Former Systems developer    Quit date: 04/22/2006  . Alcohol use No     Medication list has been reviewed and updated.   Physical Exam  Constitutional: She is oriented to person, place, and time. She appears well-developed and well-nourished.  HENT:  Right Ear: External ear and ear canal normal. Tympanic membrane is not erythematous and not retracted.  Left Ear: External ear and ear canal normal. Tympanic membrane is not erythematous and not retracted.  Nose: Right sinus exhibits maxillary sinus tenderness and frontal sinus tenderness. Left sinus exhibits maxillary sinus tenderness and frontal sinus tenderness.  Mouth/Throat: Uvula is midline and mucous membranes are normal. No oral lesions. Posterior oropharyngeal erythema present. No oropharyngeal exudate.  Neck: Normal range of motion. Neck supple. No thyromegaly present.  Cardiovascular: Normal rate, regular rhythm and normal heart sounds.   Pulmonary/Chest: Breath sounds normal. She has no wheezes. She has no rales.  Abdominal: Soft.  Lymphadenopathy:    She has no cervical adenopathy.  Neurological: She is alert and oriented to person, place, and time.    BP 128/82   Pulse 95   Temp 98.4 F (36.9 C) (Oral)   Resp 16   Ht 5\' 8"  (1.727 m)   Wt 173 lb (78.5 kg)   SpO2 97%   BMI 26.30 kg/m   Assessment and Plan: 1. Acute maxillary sinusitis, recurrence not specified Begin flonase and increase fluids - levofloxacin (LEVAQUIN) 500 MG tablet; Take 1 tablet (500 mg total) by mouth daily.  Dispense: 10 tablet; Refill: 0   Halina Maidens, MD [facility]  12/02/2015

## 2015-12-11 ENCOUNTER — Other Ambulatory Visit: Payer: Self-pay | Admitting: Pain Medicine

## 2016-01-11 ENCOUNTER — Other Ambulatory Visit: Payer: Self-pay | Admitting: Pain Medicine

## 2016-01-14 ENCOUNTER — Encounter: Payer: Self-pay | Admitting: Internal Medicine

## 2016-01-14 ENCOUNTER — Ambulatory Visit (INDEPENDENT_AMBULATORY_CARE_PROVIDER_SITE_OTHER): Payer: Medicare Other | Admitting: Internal Medicine

## 2016-01-14 VITALS — BP 120/82 | HR 84 | Temp 98.0°F | Resp 16 | Ht 68.0 in | Wt 177.0 lb

## 2016-01-14 DIAGNOSIS — J40 Bronchitis, not specified as acute or chronic: Secondary | ICD-10-CM | POA: Diagnosis not present

## 2016-01-14 MED ORDER — CEFDINIR 300 MG PO CAPS: 300.0000 mg | ORAL_CAPSULE | Freq: Two times a day (BID) | ORAL | 0 refills | Status: DC

## 2016-01-14 NOTE — Progress Notes (Signed)
Date:  01/14/2016   Name:  Carrie Myers   DOB:  10-10-1945   MRN:  [id number]   Chief Complaint: Cough (Started last week at beach. Has had coughing and wheezing and feels cold all week. ) Cough  This is a new problem. The current episode started in the past 7 days. The problem has been gradually worsening. The problem occurs hourly. The cough is non-productive. Associated symptoms include chills, a fever, nasal congestion and shortness of breath. Pertinent negatives include no chest pain, headaches or wheezing. She has tried OTC cough suppressant for the symptoms.    Review of Systems  Constitutional: Positive for chills and fever. Negative for fatigue.  Eyes: Negative for visual disturbance.  Respiratory: Positive for cough and shortness of breath. Negative for chest tightness and wheezing.   Cardiovascular: Negative for chest pain, palpitations and leg swelling.  Gastrointestinal: Negative for abdominal pain.  Neurological: Negative for dizziness and headaches.    Patient Active Problem List   Diagnosis Date Noted  . Status post shoulder surgery 05/27/2015  . Myocardial infarction 05/20/2015  . Glenoid labral tear 05/09/2015  . Impingement syndrome of right shoulder 04/17/2015  . Other shoulder lesions, right shoulder 04/17/2015  . CAD in native artery 01/18/2015  . Fibrocystic breast 01/18/2015  . Gastroesophageal reflux disease without esophagitis 01/18/2015  . Hypothyroidism, postablative 01/18/2015  . Fracture of right foot 01/14/2015  . Arteriosclerosis of coronary artery 12/29/2014  . DJD of shoulder 11/13/2014  . Fibromyalgia 10/02/2014  . Intercostal neuralgia 10/02/2014  . DDD (degenerative disc disease), lumbar 10/02/2014  . Facet syndrome, lumbar 10/02/2014  . Sacroiliac joint disease 10/02/2014  . Carotid artery narrowing 07/09/2014  . Benign essential HTN 07/01/2014  . HLD (hyperlipidemia) 09/22/2012  . Arthritis of hand, degenerative 09/22/2012     Prior to Admission medications   Medication Sig Start Date End Date Taking? Authorizing Provider  citalopram (CELEXA) 20 MG tablet Take 1 tablet (20 mg total) by mouth daily. 10/10/15   [name], MD  diazepam (VALIUM) 5 MG tablet Limit  1/2  tablet by mouth per day or every other day if tolerated 11/11/15   Mohammed Kindle, MD  Diclofenac-Misoprostol 75-0.2 MG TBEC Take 1 tablet by mouth 2 (two) times daily. 10/10/15   [name], MD  esomeprazole (NEXIUM) 20 MG capsule Take 40 mg by mouth daily. Over the Counter    Historical Provider, MD  HYDROcodone-acetaminophen (NORCO/VICODIN) 5-325 MG tablet Limit 3-6 tablets by mouth per day if tolerated 11/11/15   Mohammed Kindle, MD  levofloxacin (LEVAQUIN) 500 MG tablet Take 1 tablet (500 mg total) by mouth daily. 12/02/15   [name], MD  levothyroxine (SYNTHROID, LEVOTHROID) 37 MCG tablet Take 1 tablet by mouth  daily 05/21/15   [name], MD  losartan (COZAAR) 50 MG tablet Take 50 mg by mouth daily.    Historical Provider, MD  pravastatin (PRAVACHOL) 80 MG tablet Take 80 mg by mouth daily.    Historical Provider, MD  traMADol (ULTRAM) 50 MG tablet Limit 1 tablet by mouth per day or twice per day for breakthrough pain while taking hydrocodone acetaminophen if tolerated 11/12/15   Mohammed Kindle, MD    Allergies  Allergen Reactions  . Augmentin [Amoxicillin-Pot Clavulanate] Nausea And Vomiting  . Codeine Nausea And Vomiting  . Sulfa Antibiotics Nausea And Vomiting  . Shellfish Allergy Nausea And Vomiting, Nausea Only and Other (See Comments)    No issues with iodine  Past Surgical History:  Procedure Laterality Date  . ABDOMINAL HYSTERECTOMY     total  . APPENDECTOMY    . BUNIONECTOMY Bilateral   . CESAREAN SECTION    . COLONOSCOPY  2010   normal  . CORONARY ANGIOPLASTY WITH STENT PLACEMENT  04/2006  . dental implant     [age]  . SHOULDER ARTHROSCOPY Right 05/07/2015   Procedure: Extensive arthroscopic debridement  and arthroscopic subacromial decompression, right shoulder.  2. Steroid injection right thumb CMC joint.;  Surgeon: Corky Mull, MD;  Location: [facility];  Service: Orthopedics;  Laterality: Right;    Social History  Substance Use Topics  . Smoking status: Former Smoker    Types: Cigarettes  . Smokeless tobacco: Former Systems developer    Quit date: 04/22/2006  . Alcohol use No     Medication list has been reviewed and updated.   Physical Exam  Constitutional: She is oriented to person, place, and time. She appears well-developed. No distress.  HENT:  Head: Normocephalic and atraumatic.  Right Ear: Tympanic membrane is not erythematous and not retracted.  Left Ear: Tympanic membrane is not erythematous and not retracted.  Nose: Right sinus exhibits no maxillary sinus tenderness. Left sinus exhibits no maxillary sinus tenderness.  Mouth/Throat: Oropharynx is clear and moist.  Cardiovascular: Normal rate, regular rhythm and normal heart sounds.   Pulmonary/Chest: Effort normal. No respiratory distress. She has decreased breath sounds. She has no wheezes. She has rhonchi.  Musculoskeletal: Normal range of motion.  Neurological: She is alert and oriented to person, place, and time.  Skin: Skin is warm and dry. No rash noted.  Psychiatric: She has a normal mood and affect. Her speech is normal and behavior is normal. Thought content normal.  Nursing note and vitals reviewed.   BP 120/82   Pulse 84   Temp 98 F (36.7 C) (Oral)   Resp 16   Ht 5\' 8"  (1.727 m)   Wt 177 lb (80.3 kg)   SpO2 95%   BMI 26.91 kg/m   Assessment and Plan: 1. Bronchitis Continue Tussin and Tessalon perles - cefdinir (OMNICEF) 300 MG capsule; Take 1 capsule (300 mg total) by mouth 2 (two) times daily.  Dispense: 20 capsule; Refill: 0   Halina Maidens, MD [facility]  01/14/2016

## 2016-01-14 NOTE — Patient Instructions (Signed)

## 2016-01-16 ENCOUNTER — Telehealth: Payer: Self-pay | Admitting: Internal Medicine

## 2016-01-16 NOTE — Telephone Encounter (Signed)
Patient has been seeing Dr. Primus Bravo for pain management but he is moving practice to [place]. Pt had an appt on Monday but was at the beach so did not get her Tramadol refilled. Her next appt with the new pain MD is 11/27.  She spoke to [facility] at Dr. Primus Bravo office who said that it would be okay for me to write her Tramadol rx once time until her appointment.

## 2016-01-16 NOTE — Telephone Encounter (Signed)
Patient to call them and get them to call us or send [place] message in portal that says they will still see her on 11/27 if we write the Rx./jh

## 2016-01-17 ENCOUNTER — Other Ambulatory Visit: Payer: Self-pay | Admitting: Internal Medicine

## 2016-01-17 DIAGNOSIS — M797 Fibromyalgia: Secondary | ICD-10-CM

## 2016-01-20 ENCOUNTER — Other Ambulatory Visit: Payer: Self-pay | Admitting: Internal Medicine

## 2016-01-20 ENCOUNTER — Telehealth: Payer: Self-pay

## 2016-01-20 MED ORDER — TRAMADOL HCL 50 MG PO TABS: ORAL_TABLET | ORAL | Status: DC

## 2016-01-20 NOTE — Telephone Encounter (Signed)
Until seen by new Dr at Pain clinic Dr. Primus Bravo and no one else at clinic will be willing to write anything related to if they are ok with [place] filling pain meds.She explained why we need this and that we can not do pain contracts or write for more than acute issues. Patient wants to know can we write for even just a week to help until she can see them again. Nov 27 is next appt.

## 2016-02-09 ENCOUNTER — Encounter: Payer: Self-pay | Admitting: Anesthesiology

## 2016-02-09 ENCOUNTER — Ambulatory Visit: Payer: Medicare Other | Attending: Anesthesiology | Admitting: Anesthesiology

## 2016-02-09 VITALS — BP 124/74 | HR 78 | Temp 97.8°F | Resp 16 | Ht 68.0 in | Wt 180.0 lb

## 2016-02-09 DIAGNOSIS — M19012 Primary osteoarthritis, left shoulder: Secondary | ICD-10-CM | POA: Diagnosis not present

## 2016-02-09 DIAGNOSIS — M797 Fibromyalgia: Secondary | ICD-10-CM | POA: Diagnosis not present

## 2016-02-09 DIAGNOSIS — M533 Sacrococcygeal disorders, not elsewhere classified: Secondary | ICD-10-CM

## 2016-02-09 DIAGNOSIS — M5386 Other specified dorsopathies, lumbar region: Secondary | ICD-10-CM | POA: Insufficient documentation

## 2016-02-09 DIAGNOSIS — R52 Pain, unspecified: Secondary | ICD-10-CM | POA: Diagnosis present

## 2016-02-09 DIAGNOSIS — M2550 Pain in unspecified joint: Secondary | ICD-10-CM | POA: Diagnosis present

## 2016-02-09 DIAGNOSIS — M19042 Primary osteoarthritis, left hand: Secondary | ICD-10-CM | POA: Diagnosis not present

## 2016-02-09 DIAGNOSIS — M19041 Primary osteoarthritis, right hand: Secondary | ICD-10-CM

## 2016-02-09 DIAGNOSIS — M19011 Primary osteoarthritis, right shoulder: Secondary | ICD-10-CM | POA: Diagnosis not present

## 2016-02-09 DIAGNOSIS — M5382 Other specified dorsopathies, cervical region: Secondary | ICD-10-CM | POA: Insufficient documentation

## 2016-02-09 DIAGNOSIS — M25552 Pain in left hip: Secondary | ICD-10-CM | POA: Diagnosis present

## 2016-02-09 DIAGNOSIS — M5136 Other intervertebral disc degeneration, lumbar region: Secondary | ICD-10-CM | POA: Diagnosis not present

## 2016-02-09 DIAGNOSIS — M51369 Other intervertebral disc degeneration, lumbar region without mention of lumbar back pain or lower extremity pain: Secondary | ICD-10-CM

## 2016-02-09 DIAGNOSIS — M47812 Spondylosis without myelopathy or radiculopathy, cervical region: Secondary | ICD-10-CM

## 2016-02-09 DIAGNOSIS — M1288 Other specific arthropathies, not elsewhere classified, other specified site: Secondary | ICD-10-CM

## 2016-02-09 DIAGNOSIS — M47816 Spondylosis without myelopathy or radiculopathy, lumbar region: Secondary | ICD-10-CM

## 2016-02-09 MED ORDER — HYDROCODONE-ACETAMINOPHEN 5-325 MG PO TABS: 1.0000 | ORAL_TABLET | Freq: Two times a day (BID) | ORAL | 0 refills | Status: DC

## 2016-02-09 MED ORDER — TRAMADOL HCL 50 MG PO TABS: ORAL_TABLET | ORAL | 3 refills | Status: DC

## 2016-02-09 MED ORDER — DIAZEPAM 5 MG PO TABS
ORAL_TABLET | ORAL | 0 refills | Status: DC
Start: 2016-02-09 — End: 2016-04-14

## 2016-02-09 NOTE — Patient Instructions (Signed)

## 2016-02-11 NOTE — Progress Notes (Signed)
Subjective:  Patient ID: Carrie Myers, female    DOB: [birth year]  Age: 70 y.o. MRN: [id number]  CC: Joint Pain (arthritis); Hip Pain (left); and Generalized Body Aches (fibromyalgia)      PROCEDURE:None  HPI Carrie Myers presents for a new patient evaluation. She is a previous patient of Dr. Belenda Cruise crisp. She desires to keep her pain management here in town and has asked to be referred to me for evaluation. She has a long-standing history of low back pain and diffuse systemic body pain for 17 years and is on medication management for that. She describes pain in the low back which is her primary pain and diffuse shoulder posterior shoulder hip buttock and lower extremity pain consistent with fibromyalgia as well. Her low back pain radiates into the left lower extremity and she has had previous epidural steroid injections for that which has effectively relieved her sciatica. She has some of the symptoms now but not severe and currently manageable. She also has intermittent neck and shoulder pain and has had previous injection therapy for that without significant relief. She describes pain that's active throughout the day and aggravated by bending kneeling lifting motion and standing. Twisting also aggravates the pain and alleviating factors include rest and medication management and epidural steroids for her back pain. The pain is agonizing generally consistent throughout the day and only managed well with her medication management and occasionally with some of her exercises. She has associated intermittent weakness to the lower extremities as well. As mentioned she has had previous epidural steroid injections and a previous MRI with no significant changes in her symptoms since her MRI.  History Cambry has a past medical history of Allergy; Anxiety; Arthritis; Bilateral dry eyes; Dental crowns present; Fibromyalgia; GERD (gastroesophageal reflux disease); Hyperlipidemia; Hypertension;  Kidney stones; Myocardial infarction (04/22/2006); Osteoarthritis; Rheumatoid arthritis ([place]); and Thyroid disease.   She has a past surgical history that includes Appendectomy; Cesarean section; Abdominal hysterectomy; Bunionectomy (Bilateral); dental implant; Colonoscopy (2010); Coronary angioplasty with stent (04/2006); and Shoulder arthroscopy (Right, 05/07/2015).   Her family history includes Diabetes in her sister; Heart disease in her father; Pancreatic cancer in her sister; Stroke in her mother.She reports that she has quit smoking. Her smoking use included Cigarettes. She quit smokeless tobacco use about 9 years ago. She reports that she does not drink alcohol or use drugs.  Results for orders placed in visit on 11/11/09  MR [place] W/O Cm   Narrative * PRIOR REPORT IMPORTED FROM AN EXTERNAL SYSTEM *   PRIOR REPORT IMPORTED FROM THE [facility] WORKFLOW SYSTEM   REASON FOR EXAM:    sciatica pain  COMMENTS:   PROCEDURE:     MMR - MMR LUMBAR SPINE WO CONTRAST  - Nov 11 2009  9:50AM   RESULT:     MRI LUMBAR SPINE WITHOUT CONTRAST   HISTORY: Sciatic pain   COMPARISON: None   TECHNIQUE: Multiplanar and multisequence MRI of the lumbar spine were  obtained, without administration of IV contrast.   FINDINGS:   The vertebral body heights are maintained. There is grade 1  anterolisthesis  of L4 on L5. There is normal bone marrow signal demonstrated throughout  the  vertebra. The intervertebral disc spaces are well-maintained.   The spinal cord is of normal volume and contour. The cord terminates  normally at L1 . The nerve roots of the cauda equina and the filum  terminale  have the usual appearance.   The visualized portions  of the SI joints are unremarkable.   The imaged intra-abdominal contents are unremarkable.   T11-T12: There is a small left paracentral disc protrusion.   T12-L1: No significant disc bulge. No evidence of neural foraminal or  central stenosis.   L1-L2:  No significant disc bulge. No evidence of neural foraminal or  central  stenosis.   L2-L3: No significant disc bulge. No evidence of neural foraminal or  central  stenosis.   L3-L4: Mild broad-based disc bulge with an annular tear. No evidence of  neural foraminal or central stenosis.   L4-L5: Mild broad-based disc bulge. Severe bilateral facet arthropathy  with  ligamentum flavum infolding resulting in moderate spinal stenosis. There  is  moderate left foraminal stenosis.   L5-S1: A tiny central disc protrusion without significant impression on  the  ventral thecal sac. No evidence of neural foraminal or central stenosis.   IMPRESSION:   1. Lumbar spine spondylosis as described above.        ToxAssure Select 13  Date Value Ref Range Status  06/24/2015 FINAL  Final    Comment:    ==================================================================== TOXASSURE SELECT 13 (MW) ==================================================================== Test                             Result       Flag       Units Drug Present and Declared for Prescription Verification   Desmethyldiazepam              477          EXPECTED   ng/mg creat   Oxazepam                       >3425        EXPECTED   ng/mg creat   Temazepam                      737          EXPECTED   ng/mg creat    Desmethyldiazepam, oxazepam, and temazepam are expected    metabolites of diazepam. Desmethyldiazepam and oxazepam are also    expected metabolites of other drugs, including chlordiazepoxide,    prazepam, clorazepate, and halazepam. Oxazepam is an expected    metabolite of temazepam. Oxazepam and temazepam are also    available as scheduled prescription medications.   Hydrocodone                    208          EXPECTED   ng/mg creat   Norhydrocodone                 84           EXPECTED   ng/mg creat    Sources of hydrocodone include scheduled prescription    medications. Norhydrocodone is an expected  metabolite of    hydrocodone.   Tramadol                       PRESENT      EXPECTED   O-Desmethyltramadol            PRESENT      EXPECTED   N-Desmethyltramadol            PRESENT      EXPECTED    Source of tramadol is a prescription medication.  O-desmethyltramadol and N-desmethyltramadol are expected    metabolites of tramadol. ==================================================================== Test                      Result    Flag   Units      Ref Range   Creatinine              146              mg/dL      >=20 ==================================================================== Declared Medications:  The flagging and interpretation on this report are based on the  following declared medications.  Unexpected results may arise from  inaccuracies in the declared medications.  **Note: The testing scope of this panel includes these medications:  Diazepam (Valium)  Hydrocodone (Norco)  Hydrocodone (Vicodin)  Tramadol (Ultram)  **Note: The testing scope of this panel does not include following  reported medications:  Acetaminophen (Norco)  Acetaminophen (Vicodin)  Citalopram (Celexa)  Cyclobenzaprine (Flexeril)  Diclofenac (Diclofenac-Misoprostol)  Levofloxacin (Levaquin)  Levothyroxine (Synthroid)  Losartan (Cozaar)  Misoprostol (Diclofenac-Misoprostol)  Multivitamin (Centrum Silver)  Omeprazole (Nexium)  Pravastatin (Pravachol)  Supplement  Supplement (Centrum Silver) ==================================================================== For clinical consultation, please call [phone]. ====================================================================     Outpatient Medications Prior to Visit  Medication Sig Dispense Refill  . citalopram (CELEXA) 20 MG tablet TAKE 1 TABLET BY MOUTH  DAILY 90 tablet 3  . Diclofenac-Misoprostol 75-0.2 MG TBEC TAKE 1 TABLET BY MOUTH TWO  TIMES DAILY 180 tablet 3  . esomeprazole (NEXIUM) 20 MG capsule Take 40 mg by mouth daily.  Over the Counter    . levothyroxine (SYNTHROID, LEVOTHROID) 88 MCG tablet Take 1 tablet by mouth  daily 90 tablet 3  . losartan (COZAAR) 50 MG tablet Take 50 mg by mouth daily.    . pravastatin (PRAVACHOL) 80 MG tablet Take 80 mg by mouth daily.    . diazepam (VALIUM) 5 MG tablet Limit  1/2  tablet by mouth per day or every other day if tolerated 10 tablet 0  . HYDROcodone-acetaminophen (NORCO/VICODIN) 5-325 MG tablet Limit 3-6 tablets by mouth per day if tolerated 160 tablet 0  . traMADol (ULTRAM) 50 MG tablet Limit 1 tablet by mouth per day or twice per day for breakthrough pain while taking hydrocodone acetaminophen if tolerated 40 tablet No refill  . cefdinir (OMNICEF) 300 MG capsule Take 1 capsule (300 mg total) by mouth 2 (two) times daily. (Patient not taking: Reported on 02/09/2016) 20 capsule 0   No facility-administered medications prior to visit.    Lab Results  Component Value Date   HGB 13.3 01/30/2014   CHOL 148 01/30/2014   TRIG 143 01/30/2014   HDL 46 01/30/2014   LDLCALC 73 01/30/2014   CREATININE 0.9 01/30/2014   BUN 23 (A) 01/30/2014   TSH 1.52 01/30/2014    --------------------------------------------------------------------------------------------------------------------- No results found.     ---------------------------------------------------------------------------------------------------------------------- Past Medical History:  Diagnosis Date  . Allergy   . Anxiety   . Arthritis    neck, hands, lower back  . Bilateral dry eyes   . Dental crowns present    Implants  . Fibromyalgia   . GERD (gastroesophageal reflux disease)   . Hyperlipidemia   . Hypertension   . Kidney stones   . Myocardial infarction 04/22/2006  . Osteoarthritis   . Rheumatoid arthritis ([place])   . Thyroid disease     Past Surgical History:  Procedure Laterality Date  . ABDOMINAL HYSTERECTOMY     total  .  APPENDECTOMY    . BUNIONECTOMY Bilateral   . CESAREAN SECTION    .  COLONOSCOPY  2010   normal  . CORONARY ANGIOPLASTY WITH STENT PLACEMENT  04/2006  . dental implant     [age]  . SHOULDER ARTHROSCOPY Right 05/07/2015   Procedure: Extensive arthroscopic debridement and arthroscopic subacromial decompression, right shoulder.  2. Steroid injection right thumb CMC joint.;  Surgeon: Corky Mull, MD;  Location: [facility];  Service: Orthopedics;  Laterality: Right;    Family History  Problem Relation Age of Onset  . Stroke Mother   . Heart disease Father   . Pancreatic cancer Sister   . Diabetes Sister     Social History  Substance Use Topics  . Smoking status: Former Smoker    Types: Cigarettes  . Smokeless tobacco: Former Systems developer    Quit date: 04/22/2006  . Alcohol use No    ---------------------------------------------------------------------------------------------------------------------- Social History   Social History  . Marital status: Widowed    Spouse name: N/A  . Number of children: N/A  . Years of education: N/A   Social History Main Topics  . Smoking status: Former Smoker    Types: Cigarettes  . Smokeless tobacco: Former Systems developer    Quit date: 04/22/2006  . Alcohol use No  . Drug use: No  . Sexual activity: Not Asked   Other Topics Concern  . None   Social History Narrative  . None    Scheduled Meds: Continuous Infusions: PRN Meds:.   BP 124/74 (BP Location: Left Arm, Patient Position: Sitting, Cuff Size: Normal)   Pulse 78   Temp 97.8 F (36.6 C) (Oral)   Resp 16   Ht 5' 8"  (1.727 m)   Wt 180 lb (81.6 kg)   SpO2 94%   BMI 27.37 kg/m    BP Readings from Last 3 Encounters:  02/09/16 124/74  01/14/16 120/82  12/02/15 128/82     Wt Readings from Last 3 Encounters:  02/09/16 180 lb (81.6 kg)  01/14/16 177 lb (80.3 kg)  12/02/15 173 lb (78.5 kg)      ----------------------------------------------------------------------------------------------------------------------  ROS Review of Systems  Cardiac: High blood pressure previous heart attack in 2008 and previous stent. Pulmonary: Bronchitis Neurologic: As above GI: Reflux Endocrine: Thyroid Rheumatologic: Rheumatoid arthritis fibromyalgia   Objective:  BP 124/74 (BP Location: Left Arm, Patient Position: Sitting, Cuff Size: Normal)   Pulse 78   Temp 97.8 F (36.6 C) (Oral)   Resp 16   Ht 5' 8"  (1.727 m)   Wt 180 lb (81.6 kg)   SpO2 94%   BMI 27.37 kg/m   Physical Exam Patient is alert oriented cooperative compliant and a good historian. Her heart is regular rate and rhythm without murmur Lungs are clear  Inspection of the neck reveals some mild paraspinous muscle tenderness and tenderness in the trapezius but no trigger points. She has some paraspinous muscle tenderness in the lumbar region. Some pain with extension and left and right lateral rotation in the supine position she has a positive straight leg raise on the left side negative on the right but her muscle tone and bulk is good and strength appears to be intact.     Assessment & Plan:   Derita was seen today for joint pain, hip pain and generalized body aches.  Diagnoses and all orders for this visit:  Fibromyalgia  DDD (degenerative disc disease), lumbar  Sacroiliac joint disease  Primary osteoarthritis of both shoulders  Primary osteoarthritis of both  hands  Facet syndrome, lumbar  Cervical facet joint syndrome  Other orders -     HYDROcodone-acetaminophen (NORCO/VICODIN) 5-325 MG tablet; Take 1 tablet by mouth 2 (two) times daily at 10 AM and 5 PM. Limit 1-2 tablets by mouth per day if tolerated -     traMADol (ULTRAM) 50 MG tablet; Limit 1 tablet by mouth up to three times per day for breakthrough pain while taking hydrocodone acetaminophen as needed for severe pain -     diazepam (VALIUM)  5 MG tablet; Limit  1/2  tablet by mouth per day or every other day if tolerated     ----------------------------------------------------------------------------------------------------------------------  Problem List Items Addressed This Visit      Musculoskeletal and Integument   Arthritis of hand, degenerative   Relevant Medications   HYDROcodone-acetaminophen (NORCO/VICODIN) 5-325 MG tablet   traMADol (ULTRAM) 50 MG tablet   DDD (degenerative disc disease), lumbar   Relevant Medications   HYDROcodone-acetaminophen (NORCO/VICODIN) 5-325 MG tablet   traMADol (ULTRAM) 50 MG tablet   DJD of shoulder   Relevant Medications   HYDROcodone-acetaminophen (NORCO/VICODIN) 5-325 MG tablet   traMADol (ULTRAM) 50 MG tablet   Facet syndrome, lumbar   Relevant Medications   HYDROcodone-acetaminophen (NORCO/VICODIN) 5-325 MG tablet   traMADol (ULTRAM) 50 MG tablet   Sacroiliac joint disease     Other   Fibromyalgia - Primary    Other Visit Diagnoses    Cervical facet joint syndrome          ----------------------------------------------------------------------------------------------------------------------  1. Fibromyalgia Based on her discussion today she would like to increase her Ultram 2 3 times a day dosing. Previously she was at one up to 2 per day and reduce her Vicodin dosing to 1 or 2 a day maximum. I think this is reasonable and have made per prescription is for this with a return to clinic for evaluation one month.  2. DDD (degenerative disc disease), lumbar We may ultimately consider an epidural steroid for any refractory sciatica  3. Sacroiliac joint disease   4. Primary osteoarthritis of both shoulders   5. Primary osteoarthritis of both hands   6. Facet syndrome, lumbar   7. Cervical facet joint syndrome     ----------------------------------------------------------------------------------------------------------------------  I have changed Ms.  Alves's HYDROcodone-acetaminophen and traMADol. I am also having her maintain her esomeprazole, losartan, pravastatin, levothyroxine, cefdinir, citalopram, Diclofenac-Misoprostol, and diazepam.   Meds ordered this encounter  Medications  . HYDROcodone-acetaminophen (NORCO/VICODIN) 5-325 MG tablet    Sig: Take 1 tablet by mouth 2 (two) times daily at 10 AM and 5 PM. Limit 1-2 tablets by mouth per day if tolerated    Dispense:  45 tablet    Refill:  0  . traMADol (ULTRAM) 50 MG tablet    Sig: Limit 1 tablet by mouth up to three times per day for breakthrough pain while taking hydrocodone acetaminophen as needed for severe pain    Dispense:  90 tablet    Refill:  3  . diazepam (VALIUM) 5 MG tablet    Sig: Limit  1/2  tablet by mouth per day or every other day if tolerated    Dispense:  15 tablet    Refill:  0       Follow-up: Return in about 1 month (around 03/10/2016) for evaluation, med refill.    Molli Barrows, MD  This dictation was performed utilizing Dragon voice recognition software.  Please excuse any unintentional or mistaken typographical errors as a result of its unedited utilization.

## 2016-03-09 ENCOUNTER — Other Ambulatory Visit: Payer: Self-pay | Admitting: Internal Medicine

## 2016-03-10 ENCOUNTER — Ambulatory Visit (INDEPENDENT_AMBULATORY_CARE_PROVIDER_SITE_OTHER): Payer: Medicare Other | Admitting: Internal Medicine

## 2016-03-10 ENCOUNTER — Encounter: Payer: Self-pay | Admitting: Internal Medicine

## 2016-03-10 ENCOUNTER — Ambulatory Visit: Payer: Medicare Other | Admitting: Anesthesiology

## 2016-03-10 VITALS — BP 146/88 | HR 101 | Temp 100.2°F | Wt 180.0 lb

## 2016-03-10 DIAGNOSIS — J101 Influenza due to other identified influenza virus with other respiratory manifestations: Secondary | ICD-10-CM

## 2016-03-10 LAB — POCT INFLUENZA A/B
INFLUENZA A, POC: POSITIVE — AB
Influenza B, POC: POSITIVE — AB

## 2016-03-10 MED ORDER — HYDROCODONE-HOMATROPINE 5-1.5 MG/5ML PO SYRP: 5.0000 mL | ORAL_SOLUTION | Freq: Four times a day (QID) | ORAL | 0 refills | Status: DC | PRN

## 2016-03-10 MED ORDER — OSELTAMIVIR PHOSPHATE 75 MG PO CAPS: 75.0000 mg | ORAL_CAPSULE | Freq: Two times a day (BID) | ORAL | 0 refills | Status: DC

## 2016-03-10 NOTE — Progress Notes (Signed)
Date:  03/10/2016   Name:  Carrie Myers   DOB:  1945-05-13   MRN:  [id number]   Chief Complaint: Headache (sinus pressure, fever, body ache, vomiting for 2 days) Headache   This is a new problem. The current episode started yesterday. The problem occurs constantly. The problem has been unchanged. Associated symptoms include coughing, drainage, a fever, muscle aches, nausea, sinus pressure and vomiting. She has tried nothing for the symptoms.    Review of Systems  Constitutional: Positive for chills, fatigue and fever.  HENT: Positive for sinus pressure.   Respiratory: Positive for cough and shortness of breath. Negative for chest tightness and wheezing.   Cardiovascular: Negative for chest pain and palpitations.  Gastrointestinal: Positive for nausea and vomiting.  Genitourinary: Negative for dysuria.  Musculoskeletal: Positive for myalgias.  Neurological: Positive for headaches.    Patient Active Problem List   Diagnosis Date Noted  . Status post shoulder surgery 05/27/2015  . Impingement syndrome of right shoulder 04/17/2015  . Other shoulder lesions, right shoulder 04/17/2015  . Fibrocystic breast 01/18/2015  . Gastroesophageal reflux disease without esophagitis 01/18/2015  . Hypothyroidism, postablative 01/18/2015  . Fracture of right foot 01/14/2015  . Arteriosclerosis of coronary artery 12/29/2014  . DJD of shoulder 11/13/2014  . Fibromyalgia 10/02/2014  . Intercostal neuralgia 10/02/2014  . DDD (degenerative disc disease), lumbar 10/02/2014  . Facet syndrome, lumbar 10/02/2014  . Sacroiliac joint disease 10/02/2014  . Carotid artery narrowing 07/09/2014  . Benign essential HTN 07/01/2014  . HLD (hyperlipidemia) 09/22/2012  . Arthritis of hand, degenerative 09/22/2012    Prior to Admission medications   Medication Sig Start Date End Date Taking? Authorizing Provider  citalopram (CELEXA) 20 MG tablet TAKE 1 TABLET BY MOUTH  DAILY 01/18/16  Yes [name], MD  diazepam (VALIUM) 5 MG tablet Limit  1/2  tablet by mouth per day or every other day if tolerated 02/09/16  Yes Molli Barrows, MD  Diclofenac-Misoprostol 75-0.2 MG TBEC TAKE 1 TABLET BY MOUTH TWO  TIMES DAILY 01/18/16  Yes [name], MD  esomeprazole (NEXIUM) 20 MG capsule Take 40 mg by mouth daily. Over the Counter   Yes Historical Provider, MD  HYDROcodone-acetaminophen (NORCO/VICODIN) 5-325 MG tablet Take 1 tablet by mouth 2 (two) times daily at 10 AM and 5 PM. Limit 1-2 tablets by mouth per day if tolerated 02/09/16  Yes Molli Barrows, MD  levothyroxine (SYNTHROID, LEVOTHROID) 88 MCG tablet Take 1 tablet by mouth  daily 05/21/15  Yes [name], MD  losartan (COZAAR) 50 MG tablet Take 50 mg by mouth daily.   Yes Historical Provider, MD  pravastatin (PRAVACHOL) 80 MG tablet Take 80 mg by mouth daily.   Yes Historical Provider, MD  traMADol (ULTRAM) 50 MG tablet Limit 1 tablet by mouth up to three times per day for breakthrough pain while taking hydrocodone acetaminophen as needed for severe pain 02/09/16  Yes Molli Barrows, MD    Allergies  Allergen Reactions  . Augmentin [Amoxicillin-Pot Clavulanate] Nausea And Vomiting  . Codeine Nausea And Vomiting  . Sulfa Antibiotics Nausea And Vomiting  . Shellfish Allergy Nausea And Vomiting, Nausea Only and Other (See Comments)    No issues with iodine     Past Surgical History:  Procedure Laterality Date  . ABDOMINAL HYSTERECTOMY     total  . APPENDECTOMY    . BUNIONECTOMY Bilateral   . CESAREAN SECTION    . COLONOSCOPY  2010  normal  . CORONARY ANGIOPLASTY WITH STENT PLACEMENT  04/2006  . dental implant     [age]  . SHOULDER ARTHROSCOPY Right 05/07/2015   Procedure: Extensive arthroscopic debridement and arthroscopic subacromial decompression, right shoulder.  2. Steroid injection right thumb CMC joint.;  Surgeon: Corky Mull, MD;  Location: [facility];  Service: Orthopedics;   Laterality: Right;    Social History  Substance Use Topics  . Smoking status: Former Smoker    Types: Cigarettes  . Smokeless tobacco: Former Systems developer    Quit date: 04/22/2006  . Alcohol use No     Medication list has been reviewed and updated.   Physical Exam  Constitutional: She is oriented to person, place, and time. She appears well-developed. She has a sickly appearance.  HENT:  Head: Normocephalic and atraumatic.  Right Ear: Tympanic membrane and ear canal normal.  Left Ear: Tympanic membrane and ear canal normal.  Nose: Right sinus exhibits no maxillary sinus tenderness. Left sinus exhibits no maxillary sinus tenderness.  Mouth/Throat: No posterior oropharyngeal edema or posterior oropharyngeal erythema.  Cardiovascular: Normal rate and regular rhythm.   Pulmonary/Chest: Effort normal and breath sounds normal. No respiratory distress. She has no decreased breath sounds. She has no wheezes.  Abdominal: Soft. Normal appearance. There is no tenderness.  Musculoskeletal: Normal range of motion.  Neurological: She is alert and oriented to person, place, and time.  Skin: Skin is warm and dry. No rash noted.  Psychiatric: She has a normal mood and affect. Her behavior is normal. Thought content normal.  Nursing note and vitals reviewed.   BP (!) 146/88   Pulse (!) 101   Temp 100.2 F (37.9 C)   Wt 180 lb (81.6 kg)   SpO2 97%   BMI 27.37 kg/m   Assessment and Plan: 1. Influenza A Take tylenol every 4-6 hours Increase fluids - POCT Influenza A/B - oseltamivir (TAMIFLU) 75 MG capsule; Take 1 capsule (75 mg total) by mouth 2 (two) times daily.  Dispense: 10 capsule; Refill: 0 - HYDROcodone-homatropine (HYCODAN) 5-1.5 MG/5ML syrup; Take 5 mLs by mouth every 6 (six) hours as needed for cough.  Dispense: 180 mL; Refill: 0   Halina Maidens, MD [facility]  03/10/2016

## 2016-03-10 NOTE — Patient Instructions (Signed)

## 2016-03-11 ENCOUNTER — Telehealth: Payer: Self-pay | Admitting: Anesthesiology

## 2016-03-11 NOTE — Telephone Encounter (Signed)
Patient went to pcp and she has Type A flu, no appts available to reschedule her, can she pick up a script Friday or next week until we can get her in to clinic for appt.

## 2016-03-11 NOTE — Telephone Encounter (Signed)
No answer and no way to leave voicemail.

## 2016-03-11 NOTE — Telephone Encounter (Signed)
Patient called and informed that she could pick up script for hydrocodone today before 4 or tomorrow ( if staff here) patient to call before picking up to ensure that staff would be here.

## 2016-03-11 NOTE — Telephone Encounter (Signed)
Patient diagnosed with flu at PCP yesterday. Will ask Dr. Andree Elk if he will cover her meds until she can reschedule.

## 2016-03-15 HISTORY — PX: OTHER SURGICAL HISTORY: SHX169

## 2016-03-16 ENCOUNTER — Ambulatory Visit (INDEPENDENT_AMBULATORY_CARE_PROVIDER_SITE_OTHER): Payer: Medicare Other | Admitting: Internal Medicine

## 2016-03-16 ENCOUNTER — Encounter: Payer: Self-pay | Admitting: Internal Medicine

## 2016-03-16 VITALS — BP 142/86 | HR 88 | Temp 97.7°F | Ht 68.0 in | Wt 177.0 lb

## 2016-03-16 DIAGNOSIS — E890 Postprocedural hypothyroidism: Secondary | ICD-10-CM | POA: Diagnosis not present

## 2016-03-16 DIAGNOSIS — J101 Influenza due to other identified influenza virus with other respiratory manifestations: Secondary | ICD-10-CM

## 2016-03-16 DIAGNOSIS — M797 Fibromyalgia: Secondary | ICD-10-CM | POA: Diagnosis not present

## 2016-03-16 DIAGNOSIS — Z0001 Encounter for general adult medical examination with abnormal findings: Secondary | ICD-10-CM

## 2016-03-16 DIAGNOSIS — I10 Essential (primary) hypertension: Secondary | ICD-10-CM

## 2016-03-16 DIAGNOSIS — E782 Mixed hyperlipidemia: Secondary | ICD-10-CM

## 2016-03-16 DIAGNOSIS — K219 Gastro-esophageal reflux disease without esophagitis: Secondary | ICD-10-CM | POA: Diagnosis not present

## 2016-03-16 DIAGNOSIS — Z1159 Encounter for screening for other viral diseases: Secondary | ICD-10-CM | POA: Diagnosis not present

## 2016-03-16 DIAGNOSIS — Z0000 Encounter for general adult medical examination without abnormal findings: Secondary | ICD-10-CM

## 2016-03-16 LAB — POCT URINALYSIS DIPSTICK
Bilirubin, UA: NEGATIVE
Blood, UA: NEGATIVE
Glucose, UA: NEGATIVE
KETONES UA: NEGATIVE
Leukocytes, UA: NEGATIVE
Nitrite, UA: NEGATIVE
PH UA: 5
PROTEIN UA: NEGATIVE
SPEC GRAV UA: 1.015
Urobilinogen, UA: 0.2

## 2016-03-16 MED ORDER — PREDNISONE 10 MG PO TABS: ORAL_TABLET | ORAL | 0 refills | Status: DC

## 2016-03-16 NOTE — Patient Instructions (Signed)
Health Maintenance  Topic Date Due  . Hepatitis C Screening  [birth year]  . ZOSTAVAX  04/27/2007  . DEXA SCAN  04/26/2012  . INFLUENZA VACCINE  11/14/2016 (Originally 10/14/2015)  . MAMMOGRAM  03/20/2016  . COLONOSCOPY  03/16/2018  . TETANUS/TDAP  09/07/2021  . PNA vac Low Risk Adult  Completed

## 2016-03-16 NOTE — Progress Notes (Signed)
Patient: Carrie Myers, Female    DOB: 05-Apr-1945, 71 y.o.   MRN: [id number] Visit Date: 03/16/2016  Today's Provider: Halina Maidens, MD   Chief Complaint  Patient presents with  . Medicare Wellness   Subjective:    Annual wellness visit Carrie Myers is a 70 y.o. female who presents today for her Subsequent Annual Wellness Visit. She feels fairly well - recovering from the flu. She reports exercising some. She reports she is sleeping fairly well.  She still has some cough, sinus pressure and fatigue.  Tamiflu is completed.  Fever has resolved. ----------------------------------------------------------- Hypertension  The problem is controlled. Pertinent negatives include no chest pain, headaches, palpitations or shortness of breath. Past treatments include angiotensin blockers. The current treatment provides significant improvement. Hypertensive end-organ damage includes a thyroid problem.  Gastroesophageal Reflux  She complains of coughing (recovering from flu), heartburn and wheezing. She reports no abdominal pain, no chest pain, no dysphagia or no sore throat. The heartburn is of moderate intensity. Pertinent negatives include no fatigue. She has tried a PPI for the symptoms. The treatment provided significant relief.  Hyperlipidemia  The problem is controlled. Associated symptoms include myalgias. Pertinent negatives include no chest pain or shortness of breath. Current antihyperlipidemic treatment includes statins.  Thyroid Problem  Presents for follow-up visit. Patient reports no anxiety, constipation, diarrhea, fatigue, palpitations or tremors. The symptoms have been stable. Her past medical history is significant for hyperlipidemia.  Mood disorder - she is on Celexa for fibromyalgia.  Her best friend has a stroke last year and her personality has changed significantly.  Carrie Myers is having trouble coping with the change and loss of her old friend.  Review of Systems    Constitutional: Negative for chills, fatigue and fever.  HENT: Positive for congestion and postnasal drip. Negative for hearing loss, sore throat, tinnitus, trouble swallowing and voice change.   Eyes: Negative for visual disturbance.  Respiratory: Positive for cough (recovering from flu) and wheezing. Negative for chest tightness and shortness of breath.   Cardiovascular: Negative for chest pain, palpitations and leg swelling.  Gastrointestinal: Positive for heartburn. Negative for abdominal pain, constipation, diarrhea, dysphagia and vomiting.  Endocrine: Negative for polydipsia and polyuria.  Genitourinary: Negative for dysuria, frequency, genital sores, vaginal bleeding and vaginal discharge.  Musculoskeletal: Positive for back pain, gait problem and myalgias. Negative for joint swelling.  Skin: Negative for color change and rash.  Neurological: Negative for dizziness, tremors, light-headedness and headaches.  Hematological: Negative for adenopathy. Does not bruise/bleed easily.  Psychiatric/Behavioral: Positive for dysphoric mood. Negative for sleep disturbance and suicidal ideas. The patient is not nervous/anxious.     Social History   Social History  . Marital status: Widowed    Spouse name: N/A  . Number of children: N/A  . Years of education: N/A   Occupational History  . Not on file.   Social History Main Topics  . Smoking status: Former Smoker    Types: Cigarettes  . Smokeless tobacco: Former Systems developer    Quit date: 04/22/2006  . Alcohol use No  . Drug use: No  . Sexual activity: Not on file   Other Topics Concern  . Not on file   Social History Narrative  . No narrative on file    Patient Active Problem List   Diagnosis Date Noted  . Status post shoulder surgery 05/27/2015  . Impingement syndrome of right shoulder 04/17/2015  . Other shoulder lesions, right shoulder 04/17/2015  . Fibrocystic breast 01/18/2015  .  Gastroesophageal reflux disease without  esophagitis 01/18/2015  . Hypothyroidism, postablative 01/18/2015  . Fracture of right foot 01/14/2015  . Arteriosclerosis of coronary artery 12/29/2014  . DJD of shoulder 11/13/2014  . Fibromyalgia 10/02/2014  . Intercostal neuralgia 10/02/2014  . DDD (degenerative disc disease), lumbar 10/02/2014  . Facet syndrome, lumbar 10/02/2014  . Sacroiliac joint disease 10/02/2014  . Carotid artery narrowing 07/09/2014  . Benign essential HTN 07/01/2014  . Mixed hyperlipidemia 09/22/2012  . Arthritis of hand, degenerative 09/22/2012    Past Surgical History:  Procedure Laterality Date  . ABDOMINAL HYSTERECTOMY     total  . APPENDECTOMY    . BUNIONECTOMY Bilateral   . CESAREAN SECTION    . COLONOSCOPY  2010   normal  . CORONARY ANGIOPLASTY WITH STENT PLACEMENT  04/2006  . dental implant     [age]  . SHOULDER ARTHROSCOPY Right 05/07/2015   Procedure: Extensive arthroscopic debridement and arthroscopic subacromial decompression, right shoulder.  2. Steroid injection right thumb CMC joint.;  Surgeon: Corky Mull, MD;  Location: [facility];  Service: Orthopedics;  Laterality: Right;    Her family history includes Diabetes in her sister; Heart disease in her father; Pancreatic cancer in her sister; Stroke in her mother.     Previous Medications   CITALOPRAM (CELEXA) 20 MG TABLET    TAKE 1 TABLET BY MOUTH  DAILY   DIAZEPAM (VALIUM) 5 MG TABLET    Limit  1/2  tablet by mouth per day or every other day if tolerated   DICLOFENAC-MISOPROSTOL 75-0.2 MG TBEC    TAKE 1 TABLET BY MOUTH TWO  TIMES DAILY   ESOMEPRAZOLE (NEXIUM) 20 MG CAPSULE    Take 40 mg by mouth daily. Over the Counter   HYDROCODONE-ACETAMINOPHEN (NORCO/VICODIN) 5-325 MG TABLET    Take 1 tablet by mouth 2 (two) times daily at 10 AM and 5 PM. Limit 1-2 tablets by mouth per day if tolerated   HYDROCODONE-HOMATROPINE (HYCODAN) 5-1.5 MG/5ML SYRUP    Take 5 mLs by mouth every 6 (six) hours as needed for  cough.   LEVOTHYROXINE (SYNTHROID, LEVOTHROID) 88 MCG TABLET    Take 1 tablet by mouth  daily   LOSARTAN (COZAAR) 50 MG TABLET    Take 50 mg by mouth daily.   OSELTAMIVIR (TAMIFLU) 75 MG CAPSULE    Take 1 capsule (75 mg total) by mouth 2 (two) times daily.   PRAVASTATIN (PRAVACHOL) 80 MG TABLET    Take 80 mg by mouth daily.   TRAMADOL (ULTRAM) 50 MG TABLET    Limit 1 tablet by mouth up to three times per day for breakthrough pain while taking hydrocodone acetaminophen as needed for severe pain    Patient Care Team: [name], MD as PCP - General (Family Medicine) Hoyle Sauer, MD as Referring Physician (Rheumatology) Corey Skains, MD as Consulting Physician (Cardiology) Mohammed Kindle, MD as Attending Physician (Pain Medicine) Molli Barrows, MD as Consulting Physician (Pain Medicine)      Objective:   Vitals: BP (!) 142/86   Pulse 88   Temp 97.7 F (36.5 C)   Ht 5\' 8"  (1.727 m)   Wt 177 lb (80.3 kg)   SpO2 97%   BMI 26.91 kg/m   Physical Exam  Constitutional: She is oriented to person, place, and time. She appears well-developed and well-nourished. No distress.  HENT:  Head: Normocephalic and atraumatic.  Right Ear: Tympanic membrane and ear canal normal.  Left Ear: Tympanic membrane and ear  canal normal.  Nose: Right sinus exhibits no maxillary sinus tenderness. Left sinus exhibits no maxillary sinus tenderness.  Mouth/Throat: Uvula is midline and oropharynx is clear and moist.  Eyes: Conjunctivae and EOM are normal. Right eye exhibits no discharge. Left eye exhibits no discharge. No scleral icterus.  Neck: Normal range of motion. Carotid bruit is not present. No erythema present. No thyromegaly present.  Cardiovascular: Normal rate, regular rhythm, normal heart sounds and normal pulses.   Pulmonary/Chest: Effort normal. No respiratory distress. She has no wheezes. Right breast exhibits no mass, no nipple discharge, no skin change and no tenderness. Left breast  exhibits no mass, no nipple discharge, no skin change and no tenderness.  Abdominal: Soft. Bowel sounds are normal. There is no hepatosplenomegaly. There is no tenderness. There is no CVA tenderness.  Musculoskeletal: Normal range of motion.  General MSK tenderness without active inflammation noted  Lymphadenopathy:    She has no cervical adenopathy.    She has no axillary adenopathy.  Neurological: She is alert and oriented to person, place, and time. She has normal reflexes. No cranial nerve deficit or sensory deficit.  Skin: Skin is warm, dry and intact. No rash noted.  Psychiatric: She has a normal mood and affect. Her speech is normal and behavior is normal. Thought content normal.  Nursing note and vitals reviewed.   Activities of Daily Living In your present state of health, do you have any difficulty performing the following activities: 03/16/2016 07/29/2015  Hearing? N N  Vision? Y N  Difficulty concentrating or making decisions? N N  Walking or climbing stairs? N N  Dressing or bathing? N N  Doing errands, shopping? N N  Preparing Food and eating ? Y -  Using the Toilet? Y -  In the past six months, have you accidently leaked urine? N -  Do you have problems with loss of bowel control? N -  Managing your Medications? Y -  Managing your Finances? Y -  Housekeeping or managing your Housekeeping? Y -  Some recent data might be hidden    Fall Risk Assessment Fall Risk  03/16/2016 02/09/2016 11/11/2015 09/19/2015 08/18/2015  Falls in the past year? No No No No No  Number falls in past yr: - - - - -  Injury with Fall? - - - - -  Follow up - - - - -      Depression Screen PHQ 2/9 Scores 03/16/2016 02/09/2016 11/11/2015 09/19/2015  PHQ - 2 Score 2 0 0 1  PHQ- 9 Score 6 - - -  Exception Documentation - - - -   6CIT Screen 03/16/2016  What Year? 0 points  What month? 0 points  What time? 0 points  Count back from 20 0 points  Months in reverse 0 points  Repeat phrase 0 points   Total Score 0    Medicare Annual Wellness Visit Summary:  Reviewed patient's Family Medical History Reviewed and updated list of patient's medical providers Assessment of cognitive impairment was done Assessed patient's functional ability Established a written schedule for health screening [place] Completed and Reviewed  Exercise Activities and Dietary recommendations Goals    . Weight (lb) < 200 lb (90.7 kg)       Immunization History  Administered Date(s) Administered  . Pneumococcal Conjugate-13 01/30/2014  . Pneumococcal Polysaccharide-23 09/22/2012  . Tdap 09/08/2011    Health Maintenance  Topic Date Due  . Hepatitis C Screening  May 08, 1945  . ZOSTAVAX  04/27/2007  .  DEXA SCAN  04/26/2012  . INFLUENZA VACCINE  11/14/2016 (Originally 10/14/2015)  . MAMMOGRAM  03/20/2016  . COLONOSCOPY  03/16/2018  . TETANUS/TDAP  09/07/2021  . PNA vac Low Risk Adult  Completed    Discussed health benefits of physical activity, and encouraged her to engage in regular exercise appropriate for her age and condition.    ------------------------------------------------------------------------------------------------------------  Assessment & Plan:  1. Medicare annual wellness visit, subsequent Measures satisfied Mammogram ordered for 03/23/16 Will give Rx for Zostavax Recommend support group or similar for grief issues - POCT urinalysis dipstick  2. Benign essential HTN Elevated today due to illness/cough - Comprehensive metabolic panel  3. Gastroesophageal reflux disease without esophagitis Stable in PPI - CBC with Differential/Platelet  4. Hypothyroidism, postablative Supplemented; change dose if needed - TSH  5. Mixed hyperlipidemia On statin therapy - Lipid panel  6. Need for hepatitis C screening test - Hepatitis C antibody  7. Fibromyalgia Followed by Rheumatology On pain medications and SSRI  8. Influenza A Resolving but with  residual chest sx Will give prednisone taper  Halina Maidens, MD [facility]  03/16/2016

## 2016-03-17 LAB — CBC WITH DIFFERENTIAL/PLATELET
BASOS ABS: 0 10*3/uL (ref 0.0–0.2)
Basos: 1 %
EOS (ABSOLUTE): 0.1 10*3/uL (ref 0.0–0.4)
Eos: 2 %
Hematocrit: 39.4 % (ref 34.0–46.6)
Hemoglobin: 13 g/dL (ref 11.1–15.9)
IMMATURE GRANS (ABS): 0 10*3/uL (ref 0.0–0.1)
IMMATURE GRANULOCYTES: 0 %
LYMPHS: 32 %
Lymphocytes Absolute: 1.9 10*3/uL (ref 0.7–3.1)
MCH: 28.9 pg (ref 26.6–33.0)
MCHC: 33 g/dL (ref 31.5–35.7)
MCV: 88 fL (ref 79–97)
Monocytes Absolute: 0.5 10*3/uL (ref 0.1–0.9)
Monocytes: 8 %
NEUTROS ABS: 3.3 10*3/uL (ref 1.4–7.0)
NEUTROS PCT: 57 %
PLATELETS: 215 10*3/uL (ref 150–379)
RBC: 4.5 x10E6/uL (ref 3.77–5.28)
RDW: 13.3 % (ref 12.3–15.4)
WBC: 5.8 10*3/uL (ref 3.4–10.8)

## 2016-03-17 LAB — COMPREHENSIVE METABOLIC PANEL
A/G RATIO: 1.4 (ref 1.2–2.2)
ALK PHOS: 114 IU/L (ref 39–117)
ALT: 30 IU/L (ref 0–32)
AST: 27 IU/L (ref 0–40)
Albumin: 4.4 g/dL (ref 3.6–4.8)
BUN/Creatinine Ratio: 17 (ref 12–28)
BUN: 15 mg/dL (ref 8–27)
Bilirubin Total: 0.4 mg/dL (ref 0.0–1.2)
CALCIUM: 9.2 mg/dL (ref 8.7–10.3)
CO2: 27 mmol/L (ref 18–29)
Chloride: 99 mmol/L (ref 96–106)
Creatinine, Ser: 0.86 mg/dL (ref 0.57–1.00)
GFR calc Af Amer: 80 mL/min/{1.73_m2} (ref >59)
GFR, EST NON AFRICAN AMERICAN: 70 mL/min/{1.73_m2} (ref >59)
Globulin, Total: 3.2 g/dL (ref 1.5–4.5)
Glucose: 92 mg/dL (ref 65–99)
POTASSIUM: 4.6 mmol/L (ref 3.5–5.2)
Sodium: 141 mmol/L (ref 134–144)
Total Protein: 7.6 g/dL (ref 6.0–8.5)

## 2016-03-17 LAB — LIPID PANEL
CHOL/HDL RATIO: 2.9 ratio (ref 0.0–4.4)
CHOLESTEROL TOTAL: 119 mg/dL (ref 100–199)
HDL: 41 mg/dL (ref >39)
LDL Calculated: 47 mg/dL (ref 0–99)
TRIGLYCERIDES: 154 mg/dL — AB (ref 0–149)
VLDL Cholesterol Cal: 31 mg/dL (ref 5–40)

## 2016-03-17 LAB — TSH: TSH: 6.14 u[IU]/mL — ABNORMAL HIGH (ref 0.450–4.500)

## 2016-03-17 LAB — HEPATITIS C ANTIBODY: Hep C Virus Ab: <0.1 s/co ratio (ref 0.0–0.9)

## 2016-03-22 ENCOUNTER — Telehealth: Payer: Self-pay | Admitting: Anesthesiology

## 2016-03-22 NOTE — Telephone Encounter (Signed)
Spoke back with patient to let her know that after having discussion with Anderson Malta and the information that was given between her and patient during last phone call, we will be unable to prescribe Valium at this time.  Dr Andree Elk is also aware of phone call from last refill of Hydrocodone-APAP and is in agreement that he will need to see the patient prior to prescribing additional valium for her. Patient verbalizes u/o information.

## 2016-03-22 NOTE — Telephone Encounter (Signed)
Patient will be out of valium or ultram, no appts available sooner than 04-20-16

## 2016-03-22 NOTE — Telephone Encounter (Signed)
Spoke with patient re; her request for medications.  Patient educated that she has refills left on her Tramadol of which she was not aware.  She still would like to have her Valium called in for her.  Her request is that she takes, Valium 5 mg 1/2 tab qhs for sleep.  This was last prescribed on 02/09/16.  Told her I would speak with Dr Andree Elk this afternoon and inform her of his response.

## 2016-03-23 ENCOUNTER — Ambulatory Visit
Admission: RE | Admit: 2016-03-23 | Discharge: 2016-03-23 | Disposition: A | Discharge status: Home / Self Care | Payer: Medicare Other | Source: Ambulatory Visit | Attending: Internal Medicine | Admitting: Internal Medicine

## 2016-03-23 DIAGNOSIS — Z1231 Encounter for screening mammogram for malignant neoplasm of breast: Secondary | ICD-10-CM | POA: Diagnosis not present

## 2016-03-26 ENCOUNTER — Other Ambulatory Visit: Payer: Self-pay | Admitting: Internal Medicine

## 2016-04-05 ENCOUNTER — Encounter: Payer: Self-pay | Admitting: *Deleted

## 2016-04-05 ENCOUNTER — Ambulatory Visit
Admission: EM | Admit: 2016-04-05 | Discharge: 2016-04-05 | Disposition: A | Discharge status: Home / Self Care | Payer: Medicare Other | Attending: Family Medicine | Admitting: Family Medicine

## 2016-04-05 DIAGNOSIS — J0100 Acute maxillary sinusitis, unspecified: Secondary | ICD-10-CM

## 2016-04-05 MED ORDER — CEFDINIR 300 MG PO CAPS: 300.0000 mg | ORAL_CAPSULE | Freq: Two times a day (BID) | ORAL | 0 refills | Status: DC

## 2016-04-05 NOTE — ED Provider Notes (Signed)
[facility]    CSN: [id number] Arrival date & time: 04/05/16  1412     History   Chief Complaint Chief Complaint  Patient presents with  . Nasal Congestion  . Facial Pain  . Headache    HPI Carrie Myers is a 71 y.o. female.    Headache  Associated symptoms: congestion, facial pain and URI   URI  Presenting symptoms: congestion and facial pain   Severity:  Moderate Onset quality:  Sudden Duration:  7 days Timing:  Constant Progression:  Worsening Chronicity:  New Relieved by:  Nothing Ineffective treatments:  OTC medications Associated symptoms: headaches   Risk factors: being elderly, chronic cardiac disease and sick contacts   Risk factors: no immunosuppression     Past Medical History:  Diagnosis Date  . Allergy   . Anxiety   . Arthritis    neck, hands, lower back  . Bilateral dry eyes   . Dental crowns present    Implants  . Fibromyalgia   . GERD (gastroesophageal reflux disease)   . Hyperlipidemia   . Hypertension   . Kidney stones   . Myocardial infarction 04/22/2006  . Osteoarthritis   . Rheumatoid arthritis ([place])   . Thyroid disease     Patient Active Problem List   Diagnosis Date Noted  . Status post shoulder surgery 05/27/2015  . Impingement syndrome of right shoulder 04/17/2015  . Other shoulder lesions, right shoulder 04/17/2015  . Fibrocystic breast 01/18/2015  . Gastroesophageal reflux disease without esophagitis 01/18/2015  . Hypothyroidism, postablative 01/18/2015  . Fracture of right foot 01/14/2015  . Arteriosclerosis of coronary artery 12/29/2014  . DJD of shoulder 11/13/2014  . Fibromyalgia 10/02/2014  . Intercostal neuralgia 10/02/2014  . DDD (degenerative disc disease), lumbar 10/02/2014  . Facet syndrome, lumbar 10/02/2014  . Sacroiliac joint disease 10/02/2014  . Carotid artery narrowing 07/09/2014  . Benign essential HTN 07/01/2014  . Mixed hyperlipidemia 09/22/2012  . Arthritis of hand, degenerative  09/22/2012    Past Surgical History:  Procedure Laterality Date  . ABDOMINAL HYSTERECTOMY     total  . APPENDECTOMY    . BREAST BIOPSY Right    neg  . BREAST CYST ASPIRATION Left    neg  . BUNIONECTOMY Bilateral   . CESAREAN SECTION    . COLONOSCOPY  2010   normal  . CORONARY ANGIOPLASTY WITH STENT PLACEMENT  04/2006  . dental implant     [age]  . SHOULDER ARTHROSCOPY Right 05/07/2015   Procedure: Extensive arthroscopic debridement and arthroscopic subacromial decompression, right shoulder.  2. Steroid injection right thumb CMC joint.;  Surgeon: Corky Mull, MD;  Location: [facility];  Service: Orthopedics;  Laterality: Right;    OB History    No data available       Home Medications    Prior to Admission medications   Medication Sig Start Date End Date Taking? Authorizing Provider  citalopram (CELEXA) 20 MG tablet TAKE 1 TABLET BY MOUTH  DAILY 01/18/16  Yes [name], MD  Diclofenac-Misoprostol 75-0.2 MG TBEC TAKE 1 TABLET BY MOUTH TWO  TIMES DAILY 01/18/16  Yes [name], MD  esomeprazole (NEXIUM) 20 MG capsule Take 40 mg by mouth daily. Over the Counter   Yes Historical Provider, MD  HYDROcodone-acetaminophen (NORCO/VICODIN) 5-325 MG tablet Take 1 tablet by mouth 2 (two) times daily at 10 AM and 5 PM. Limit 1-2 tablets by mouth per day if tolerated 02/09/16  Yes Molli Barrows, MD  HYDROcodone-homatropine (HYCODAN) 5-1.5 MG/5ML syrup Take 5 mLs by mouth every 6 (six) hours as needed for cough. 03/10/16  Yes [name], MD  levothyroxine (SYNTHROID, LEVOTHROID) 88 MCG tablet TAKE 1 TABLET BY MOUTH  DAILY 03/26/16  Yes [name], MD  losartan (COZAAR) 50 MG tablet Take 50 mg by mouth daily.   Yes Historical Provider, MD  pravastatin (PRAVACHOL) 80 MG tablet Take 80 mg by mouth daily.   Yes Historical Provider, MD  traMADol (ULTRAM) 50 MG tablet Limit 1 tablet by mouth up to three times per day for breakthrough pain while  taking hydrocodone acetaminophen as needed for severe pain 02/09/16  Yes Molli Barrows, MD  cefdinir (OMNICEF) 300 MG capsule Take 1 capsule (300 mg total) by mouth 2 (two) times daily. 04/05/16   Norval Gable, MD  diazepam (VALIUM) 5 MG tablet Limit  1/2  tablet by mouth per day or every other day if tolerated 02/09/16   Molli Barrows, MD  oseltamivir (TAMIFLU) 75 MG capsule Take 1 capsule (75 mg total) by mouth 2 (two) times daily. 03/10/16   [name], MD  predniSONE (DELTASONE) 10 MG tablet Take 6 on day 1, 5 on day 2, 4 on day 3, 3 on day 4, 2 on day 5 and 1 on day 1 then stop. 03/16/16   [name], MD    Family History Family History  Problem Relation Age of Onset  . Stroke Mother   . Heart disease Father   . Pancreatic cancer Sister   . Diabetes Sister   . Breast cancer Neg Hx     Social History Social History  Substance Use Topics  . Smoking status: Former Smoker    Types: Cigarettes  . Smokeless tobacco: Former Systems developer    Quit date: 04/22/2006  . Alcohol use No     Allergies   Augmentin [amoxicillin-pot clavulanate]; Codeine; Sulfa antibiotics; and Shellfish allergy   Review of Systems Review of Systems  HENT: Positive for congestion.   Neurological: Positive for headaches.     Physical Exam Triage Vital Signs ED Triage Vitals  Enc Vitals Group     BP 04/05/16 1435 131/88     Pulse Rate 04/05/16 1435 82     Resp 04/05/16 1435 16     Temp 04/05/16 1435 98.2 F (36.8 C)     Temp Source 04/05/16 1435 Oral     SpO2 04/05/16 1435 96 %     Weight 04/05/16 1438 180 lb (81.6 kg)     Height 04/05/16 1438 5\' 8"  (1.727 m)     Head Circumference --      Peak Flow --      Pain Score --      Pain Loc --      Pain Edu? --      Excl. in [place]? --    No data found.   Updated Vital Signs BP 131/88 (BP Location: Left Arm)   Pulse 82   Temp 98.2 F (36.8 C) (Oral)   Resp 16   Ht 5\' 8"  (1.727 m)   Wt 180 lb (81.6 kg)   SpO2 96%   BMI 27.37 kg/m    Visual Acuity Right Eye Distance:   Left Eye Distance:   Bilateral Distance:    Right Eye Near:   Left Eye Near:    Bilateral Near:     Physical Exam  Constitutional: She appears well-developed and well-nourished. No distress.  HENT:  Head: Normocephalic and atraumatic.  Right Ear: Tympanic membrane, external ear and ear canal normal.  Left Ear: Tympanic membrane, external ear and ear canal normal.  Nose: Mucosal edema and rhinorrhea present. No nose lacerations, sinus tenderness, nasal deformity, septal deviation or nasal septal hematoma. No epistaxis.  No foreign bodies. Right sinus exhibits maxillary sinus tenderness and frontal sinus tenderness. Left sinus exhibits maxillary sinus tenderness and frontal sinus tenderness.  Mouth/Throat: Uvula is midline, oropharynx is clear and moist and mucous membranes are normal. No oropharyngeal exudate.  Eyes: Conjunctivae and EOM are normal. Pupils are equal, round, and reactive to light. Right eye exhibits no discharge. Left eye exhibits no discharge. No scleral icterus.  Neck: Normal range of motion. Neck supple. No thyromegaly present.  Cardiovascular: Normal rate, regular rhythm and normal heart sounds.   Pulmonary/Chest: Effort normal and breath sounds normal. No respiratory distress. She has no wheezes. She has no rales.  Lymphadenopathy:    She has no cervical adenopathy.  Skin: She is not diaphoretic.  Nursing note and vitals reviewed.    UC Treatments / Results  Labs (all labs ordered are listed, but only abnormal results are displayed) Labs Reviewed - No data to display  EKG  EKG Interpretation None       Radiology No results found.  Procedures Procedures (including critical care time)  Medications Ordered in UC Medications - No data to display   Initial Impression / Assessment and Plan / UC Course  I have reviewed the triage vital signs and the nursing notes.  Pertinent labs & imaging results that were  available during my care of the patient were reviewed by me and considered in my medical decision making (see chart for details).       Final Clinical Impressions(s) / UC Diagnoses   Final diagnoses:  Acute maxillary sinusitis, recurrence not specified    New Prescriptions New Prescriptions   CEFDINIR (OMNICEF) 300 MG CAPSULE    Take 1 capsule (300 mg total) by mouth 2 (two) times daily.   1. diagnosis reviewed with patient 2. rx as per orders above; reviewed possible side effects, interactions, risks and benefits  3. Follow-up prn if symptoms worsen or don't improve   Norval Gable, MD 04/05/16 1530

## 2016-04-05 NOTE — ED Triage Notes (Signed)
Multiple URI problems recently. Today c/o nasal discharge- green, facial pain, headaches, x4-6 days.

## 2016-04-13 ENCOUNTER — Ambulatory Visit: Payer: Medicare Other | Admitting: Anesthesiology

## 2016-04-14 ENCOUNTER — Ambulatory Visit: Payer: Medicare Other | Attending: Anesthesiology | Admitting: Anesthesiology

## 2016-04-14 ENCOUNTER — Encounter: Payer: Self-pay | Admitting: Anesthesiology

## 2016-04-14 VITALS — BP 144/78 | HR 91 | Temp 97.7°F | Resp 18 | Ht 68.0 in | Wt 180.0 lb

## 2016-04-14 DIAGNOSIS — M47816 Spondylosis without myelopathy or radiculopathy, lumbar region: Secondary | ICD-10-CM

## 2016-04-14 DIAGNOSIS — M5030 Other cervical disc degeneration, unspecified cervical region: Secondary | ICD-10-CM | POA: Diagnosis not present

## 2016-04-14 DIAGNOSIS — M2550 Pain in unspecified joint: Secondary | ICD-10-CM | POA: Insufficient documentation

## 2016-04-14 DIAGNOSIS — M5136 Other intervertebral disc degeneration, lumbar region: Secondary | ICD-10-CM | POA: Diagnosis not present

## 2016-04-14 DIAGNOSIS — Z79899 Other long term (current) drug therapy: Secondary | ICD-10-CM | POA: Insufficient documentation

## 2016-04-14 DIAGNOSIS — E079 Disorder of thyroid, unspecified: Secondary | ICD-10-CM | POA: Insufficient documentation

## 2016-04-14 DIAGNOSIS — M1288 Other specific arthropathies, not elsewhere classified, other specified site: Secondary | ICD-10-CM

## 2016-04-14 DIAGNOSIS — M19012 Primary osteoarthritis, left shoulder: Secondary | ICD-10-CM | POA: Insufficient documentation

## 2016-04-14 DIAGNOSIS — I252 Old myocardial infarction: Secondary | ICD-10-CM | POA: Diagnosis not present

## 2016-04-14 DIAGNOSIS — I10 Essential (primary) hypertension: Secondary | ICD-10-CM | POA: Diagnosis not present

## 2016-04-14 DIAGNOSIS — G588 Other specified mononeuropathies: Secondary | ICD-10-CM | POA: Insufficient documentation

## 2016-04-14 DIAGNOSIS — M25559 Pain in unspecified hip: Secondary | ICD-10-CM | POA: Diagnosis not present

## 2016-04-14 DIAGNOSIS — M19011 Primary osteoarthritis, right shoulder: Secondary | ICD-10-CM | POA: Insufficient documentation

## 2016-04-14 DIAGNOSIS — M549 Dorsalgia, unspecified: Secondary | ICD-10-CM | POA: Diagnosis present

## 2016-04-14 DIAGNOSIS — Z87891 Personal history of nicotine dependence: Secondary | ICD-10-CM | POA: Diagnosis not present

## 2016-04-14 DIAGNOSIS — M47812 Spondylosis without myelopathy or radiculopathy, cervical region: Secondary | ICD-10-CM

## 2016-04-14 DIAGNOSIS — K219 Gastro-esophageal reflux disease without esophagitis: Secondary | ICD-10-CM | POA: Insufficient documentation

## 2016-04-14 DIAGNOSIS — E785 Hyperlipidemia, unspecified: Secondary | ICD-10-CM | POA: Diagnosis not present

## 2016-04-14 DIAGNOSIS — M797 Fibromyalgia: Secondary | ICD-10-CM | POA: Insufficient documentation

## 2016-04-14 DIAGNOSIS — M069 Rheumatoid arthritis, unspecified: Secondary | ICD-10-CM | POA: Diagnosis not present

## 2016-04-14 DIAGNOSIS — F419 Anxiety disorder, unspecified: Secondary | ICD-10-CM | POA: Insufficient documentation

## 2016-04-14 DIAGNOSIS — G2581 Restless legs syndrome: Secondary | ICD-10-CM | POA: Insufficient documentation

## 2016-04-14 MED ORDER — HYDROCODONE-ACETAMINOPHEN 5-325 MG PO TABS: 1.0000 | ORAL_TABLET | Freq: Two times a day (BID) | ORAL | 0 refills | Status: DC

## 2016-04-14 MED ORDER — DIAZEPAM 5 MG PO TABS: ORAL_TABLET | ORAL | 0 refills | Status: DC

## 2016-04-16 NOTE — Progress Notes (Signed)
Subjective:  Patient ID: Carrie Myers, female    DOB: 1945-06-10  Age: 71 y.o. MRN: [id number]  CC: Back Pain; Hip Pain; Neck Pain; and Joint Pain   Service Provided on Last Visit: Med Refill  PROCEDURE:None  HPI Carrie Myers presents for reevaluation. She was last seen approximately 2 months ago and has been doing reasonably well. The quality characteristic and distribution of her pain remains stable with no significant changes in lower extremity strength or function. Her back pain has been at baseline. For that, she is using Vicodin approximately one tablet per day on average. She tries to space these out to take the medication only on her worst days. In the past she has had 45 tablets per month but is trying to reduce that usage. She also has had Valium for bedtime at night to help with restless leg syndrome and takes one half of a 5 mg tablet and generally this keeps the pain from her low back spasms and restless leg irritation to a minimum. No change in bowel or bladder function is noted at this time  By history she presents as a previous patient of Dr. Belenda Cruise crisp. She desires to keep her pain management here in town and has asked to be referred to me for evaluation. She has a long-standing history of low back pain and diffuse systemic body pain for 17 years and is on medication management for that. She describes pain in the low back which is her primary pain and diffuse shoulder posterior shoulder hip buttock and lower extremity pain consistent with fibromyalgia as well. Her low back pain radiates into the left lower extremity and she has had previous epidural steroid injections for that which has effectively relieved her sciatica. She has some of the symptoms now but not severe and currently manageable. She also has intermittent neck and shoulder pain and has had previous injection therapy for that without significant relief. She describes pain that's active throughout the day  and aggravated by bending kneeling lifting motion and standing. Twisting also aggravates the pain and alleviating factors include rest and medication management and epidural steroids for her back pain. The pain is agonizing generally consistent throughout the day and only managed well with her medication management and occasionally with some of her exercises. She has associated intermittent weakness to the lower extremities as well. As mentioned she has had previous epidural steroid injections and a previous MRI with no significant changes in her symptoms since her MRI.  History Carrie Myers has a past medical history of Allergy; Anxiety; Arthritis; Bilateral dry eyes; Dental crowns present; Fibromyalgia; GERD (gastroesophageal reflux disease); Hyperlipidemia; Hypertension; Kidney stones; Myocardial infarction (04/22/2006); Osteoarthritis; Rheumatoid arthritis ([place]); and Thyroid disease.   She has a past surgical history that includes Appendectomy; Cesarean section; Abdominal hysterectomy; Bunionectomy (Bilateral); dental implant; Colonoscopy (2010); Coronary angioplasty with stent (04/2006); Shoulder arthroscopy (Right, 05/07/2015); Breast biopsy (Right); and Breast cyst aspiration (Left).   Her family history includes Diabetes in her sister; Heart disease in her father; Pancreatic cancer in her sister; Stroke in her mother.She reports that she has quit smoking. Her smoking use included Cigarettes. She quit smokeless tobacco use about 9 years ago. She reports that she does not drink alcohol or use drugs.  Results for orders placed in visit on 11/11/09  MR [place] W/O Cm   Narrative * PRIOR REPORT IMPORTED FROM AN EXTERNAL SYSTEM *   PRIOR REPORT IMPORTED FROM THE [facility]  FOR EXAM:    sciatica pain  COMMENTS:   PROCEDURE:     MMR - MMR LUMBAR SPINE WO CONTRAST  - Nov 11 2009  9:50AM   RESULT:     MRI LUMBAR SPINE WITHOUT CONTRAST   HISTORY: Sciatic pain   COMPARISON: None    TECHNIQUE: Multiplanar and multisequence MRI of the lumbar spine were  obtained, without administration of IV contrast.   FINDINGS:   The vertebral body heights are maintained. There is grade 1  anterolisthesis  of L4 on L5. There is normal bone marrow signal demonstrated throughout  the  vertebra. The intervertebral disc spaces are well-maintained.   The spinal cord is of normal volume and contour. The cord terminates  normally at L1 . The nerve roots of the cauda equina and the filum  terminale  have the usual appearance.   The visualized portions of the SI joints are unremarkable.   The imaged intra-abdominal contents are unremarkable.   T11-T12: There is a small left paracentral disc protrusion.   T12-L1: No significant disc bulge. No evidence of neural foraminal or  central stenosis.   L1-L2: No significant disc bulge. No evidence of neural foraminal or  central  stenosis.   L2-L3: No significant disc bulge. No evidence of neural foraminal or  central  stenosis.   L3-L4: Mild broad-based disc bulge with an annular tear. No evidence of  neural foraminal or central stenosis.   L4-L5: Mild broad-based disc bulge. Severe bilateral facet arthropathy  with  ligamentum flavum infolding resulting in moderate spinal stenosis. There  is  moderate left foraminal stenosis.   L5-S1: A tiny central disc protrusion without significant impression on  the  ventral thecal sac. No evidence of neural foraminal or central stenosis.   IMPRESSION:   1. Lumbar spine spondylosis as described above.        Tox[employer] Select 13  Date Value Ref Range Status  06/24/2015 FINAL  Final    Comment:    ==================================================================== TOX[employer] SELECT 13 (MW) ==================================================================== Test                             Result       Flag       Units Drug Present and Declared for Prescription Verification    Desmethyldiazepam              477          EXPECTED   ng/mg creat   Oxazepam                       >3425        EXPECTED   ng/mg creat   Temazepam                      737          EXPECTED   ng/mg creat    Desmethyldiazepam, oxazepam, and temazepam are expected    metabolites of diazepam. Desmethyldiazepam and oxazepam are also    expected metabolites of other drugs, including chlordiazepoxide,    prazepam, clorazepate, and halazepam. Oxazepam is an expected    metabolite of temazepam. Oxazepam and temazepam are also    available as scheduled prescription medications.   Hydrocodone                    208  EXPECTED   ng/mg creat   Norhydrocodone                 84           EXPECTED   ng/mg creat    Sources of hydrocodone include scheduled prescription    medications. Norhydrocodone is an expected metabolite of    hydrocodone.   Tramadol                       PRESENT      EXPECTED   O-Desmethyltramadol            PRESENT      EXPECTED   N-Desmethyltramadol            PRESENT      EXPECTED    Source of tramadol is a prescription medication.    O-desmethyltramadol and N-desmethyltramadol are expected    metabolites of tramadol. ==================================================================== Test                      Result    Flag   Units      Ref Range   Creatinine              146              mg/dL      >=20 ==================================================================== Declared Medications:  The flagging and interpretation on this report are based on the  following declared medications.  Unexpected results may arise from  inaccuracies in the declared medications.  **Note: The testing scope of this panel includes these medications:  Diazepam (Valium)  Hydrocodone (Norco)  Hydrocodone (Vicodin)  Tramadol (Ultram)  **Note: The testing scope of this panel does not include following  reported medications:  Acetaminophen (Norco)  Acetaminophen (Vicodin)   Citalopram (Celexa)  Cyclobenzaprine (Flexeril)  Diclofenac (Diclofenac-Misoprostol)  Levofloxacin (Levaquin)  Levothyroxine (Synthroid)  Losartan (Cozaar)  Misoprostol (Diclofenac-Misoprostol)  Multivitamin (Centrum Silver)  Omeprazole (Nexium)  Pravastatin (Pravachol)  Supplement  Supplement (Centrum Silver) ==================================================================== For clinical consultation, please call [phone]. ====================================================================     Outpatient Medications Prior to Visit  Medication Sig Dispense Refill  . citalopram (CELEXA) 20 MG tablet TAKE 1 TABLET BY MOUTH  DAILY 90 tablet 3  . Diclofenac-Misoprostol 75-0.2 MG TBEC TAKE 1 TABLET BY MOUTH TWO  TIMES DAILY 180 tablet 3  . esomeprazole (NEXIUM) 20 MG capsule Take 40 mg by mouth daily. Over the Counter    . levothyroxine (SYNTHROID, LEVOTHROID) 88 MCG tablet TAKE 1 TABLET BY MOUTH  DAILY 90 tablet 3  . losartan (COZAAR) 50 MG tablet Take 50 mg by mouth daily.    . pravastatin (PRAVACHOL) 80 MG tablet Take 80 mg by mouth daily.    . traMADol (ULTRAM) 50 MG tablet Limit 1 tablet by mouth up to three times per day for breakthrough pain while taking hydrocodone acetaminophen as needed for severe pain 90 tablet 3  . HYDROcodone-acetaminophen (NORCO/VICODIN) 5-325 MG tablet Take 1 tablet by mouth 2 (two) times daily at 10 AM and 5 PM. Limit 1-2 tablets by mouth per day if tolerated 45 tablet 0  . cefdinir (OMNICEF) 300 MG capsule Take 1 capsule (300 mg total) by mouth 2 (two) times daily. (Patient not taking: Reported on 04/14/2016) 20 capsule 0  . HYDROcodone-homatropine (HYCODAN) 5-1.5 MG/5ML syrup Take 5 mLs by mouth every 6 (six) hours as needed for cough. (Patient not taking: Reported on 04/14/2016) 180 mL 0  . oseltamivir (  TAMIFLU) 75 MG capsule Take 1 capsule (75 mg total) by mouth 2 (two) times daily. (Patient not taking: Reported on 04/14/2016) 10 capsule 0  .  predniSONE (DELTASONE) 10 MG tablet Take 6 on day 1, 5 on day 2, 4 on day 3, 3 on day 4, 2 on day 5 and 1 on day 1 then stop. (Patient not taking: Reported on 04/14/2016) 21 tablet 0  . diazepam (VALIUM) 5 MG tablet Limit  1/2  tablet by mouth per day or every other day if tolerated (Patient not taking: Reported on 04/14/2016) 15 tablet 0   No facility-administered medications prior to visit.    Lab Results  Component Value Date   WBC 5.8 03/16/2016   HGB 13.3 01/30/2014   HCT 39.4 03/16/2016   PLT 215 03/16/2016   GLUCOSE 92 03/16/2016   CHOL 119 03/16/2016   TRIG 154 (H) 03/16/2016   HDL 41 03/16/2016   LDLCALC 47 03/16/2016   ALT 30 03/16/2016   AST 27 03/16/2016   NA 141 03/16/2016   K 4.6 03/16/2016   CL 99 03/16/2016   CREATININE 0.86 03/16/2016   BUN 15 03/16/2016   CO2 27 03/16/2016   TSH 6.140 (H) 03/16/2016    --------------------------------------------------------------------------------------------------------------------- No results found.     ---------------------------------------------------------------------------------------------------------------------- Past Medical History:  Diagnosis Date  . Allergy   . Anxiety   . Arthritis    neck, hands, lower back  . Bilateral dry eyes   . Dental crowns present    Implants  . Fibromyalgia   . GERD (gastroesophageal reflux disease)   . Hyperlipidemia   . Hypertension   . Kidney stones   . Myocardial infarction 04/22/2006  . Osteoarthritis   . Rheumatoid arthritis ([place])   . Thyroid disease     Past Surgical History:  Procedure Laterality Date  . ABDOMINAL HYSTERECTOMY     total  . APPENDECTOMY    . BREAST BIOPSY Right    neg  . BREAST CYST ASPIRATION Left    neg  . BUNIONECTOMY Bilateral   . CESAREAN SECTION    . COLONOSCOPY  2010   normal  . CORONARY ANGIOPLASTY WITH STENT PLACEMENT  04/2006  . dental implant     [age]  . SHOULDER ARTHROSCOPY Right 05/07/2015   Procedure: Extensive  arthroscopic debridement and arthroscopic subacromial decompression, right shoulder.  2. Steroid injection right thumb CMC joint.;  Surgeon: Corky Mull, MD;  Location: [facility];  Service: Orthopedics;  Laterality: Right;    Family History  Problem Relation Age of Onset  . Stroke Mother   . Heart disease Father   . Pancreatic cancer Sister   . Diabetes Sister   . Breast cancer Neg Hx     Social History  Substance Use Topics  . Smoking status: Former Smoker    Types: Cigarettes  . Smokeless tobacco: Former Systems developer    Quit date: 04/22/2006  . Alcohol use No    ---------------------------------------------------------------------------------------------------------------------- Social History   Social History  . Marital status: Widowed    Spouse name: N/A  . Number of children: N/A  . Years of education: N/A   Social History Main Topics  . Smoking status: Former Smoker    Types: Cigarettes  . Smokeless tobacco: Former Systems developer    Quit date: 04/22/2006  . Alcohol use No  . Drug use: No  . Sexual activity: Not Asked   Other Topics Concern  . None   Social History Narrative  . None  Scheduled Meds: Continuous Infusions: PRN Meds:.   BP (!) 144/78 (BP Location: Left Arm, Patient Position: Sitting, Cuff Size: Normal)   Pulse 91   Temp 97.7 F (36.5 C) (Oral)   Resp 18   Ht _0  (1.727 m)   Wt 180 lb (81.6 kg)   SpO2 97%   BMI 27.37 kg/m    BP Readings from Last 3 Encounters:  04/14/16 (!) 144/78  04/05/16 131/88  03/16/16 (!) 142/86     Wt Readings from Last 3 Encounters:  04/14/16 180 lb (81.6 kg)  04/05/16 180 lb (81.6 kg)  03/16/16 177 lb (80.3 kg)     ----------------------------------------------------------------------------------------------------------------------  ROS Review of Systems  Cardiac: High blood pressure previous heart attack in 2008 and previous stent. GI: Minimal constipation  Objective:  BP  (!) 144/78 (BP Location: Left Arm, Patient Position: Sitting, Cuff Size: Normal)   Pulse 91   Temp 97.7 F (36.5 C) (Oral)   Resp 18   Ht _1  (1.727 m)   Wt 180 lb (81.6 kg)   SpO2 97%   BMI 27.37 kg/m   Physical Exam Patient is alert oriented cooperative compliant and a good historian. Her heart is regular rate and rhythm without murmur Lungs are clear  Inspection of the neck reveals some mild paraspinous muscle tenderness and tenderness in the trapezius but no trigger points. She continues to have some mild lumbar paraspinous spasm but otherwise her exam is unchanged   Assessment & Plan:   Carrie Myers was seen today for back pain, hip pain, neck pain and joint pain.  Diagnoses and all orders for this visit:  Fibromyalgia  Facet syndrome, lumbar  Cervical facet joint syndrome  Primary osteoarthritis of right shoulder  DDD (degenerative disc disease), cervical  Intercostal neuralgia  Other orders -     HYDROcodone-acetaminophen (NORCO/VICODIN) 5-325 MG tablet; Take 1 tablet by mouth 2 (two) times daily at 10 AM and 5 PM. Limit 1-2 tablets by mouth per day if tolerated -     diazepam (VALIUM) 5 MG tablet; Limit  1/2  tablet by mouth per day or every other day if tolerated     ----------------------------------------------------------------------------------------------------------------------  Problem List Items Addressed This Visit      Nervous and Auditory   Intercostal neuralgia   Relevant Medications   diazepam (VALIUM) 5 MG tablet     Musculoskeletal and Integument   DJD of shoulder   Relevant Medications   HYDROcodone-acetaminophen (NORCO/VICODIN) 5-325 MG tablet   Facet syndrome, lumbar   Relevant Medications   HYDROcodone-acetaminophen (NORCO/VICODIN) 5-325 MG tablet     Other   Fibromyalgia - Primary    Other Visit Diagnoses    Cervical facet joint syndrome       DDD (degenerative disc disease), cervical       Relevant Medications    HYDROcodone-acetaminophen (NORCO/VICODIN) 5-325 MG tablet      ----------------------------------------------------------------------------------------------------------------------  1. Fibromyalgia She can continue her Ultram and has asked that we reduce her Vicodin to 30 tablets per month. Based on her narcotic assessment sheet this seems to be working well and we have reviewed the practitioner database and it is appropriate.  2. DDD (degenerative disc disease), lumbar We may ultimately consider an epidural steroid for any refractory sciatica  3. Sacroiliac joint disease   4. Primary osteoarthritis of both shoulders   5. Primary osteoarthritis of both hands   6. Facet syndrome, lumbar   7. Cervical facet joint syndrome  8.  Restless leg syndrome.  I'm going to prescribe Valium 5 mg tablets one half daily at bedtime to assist. She is to return to clinic in 1-2 months for reevaluation.     ----------------------------------------------------------------------------------------------------------------------  I am having Carrie Myers maintain her esomeprazole, losartan, pravastatin, citalopram, Diclofenac-Misoprostol, traMADol, oseltamivir, HYDROcodone-homatropine, predniSONE, levothyroxine, cefdinir, HYDROcodone-acetaminophen, and diazepam.   Meds ordered this encounter  Medications  . HYDROcodone-acetaminophen (NORCO/VICODIN) 5-325 MG tablet    Sig: Take 1 tablet by mouth 2 (two) times daily at 10 AM and 5 PM. Limit 1-2 tablets by mouth per day if tolerated    Dispense:  30 tablet    Refill:  0  . diazepam (VALIUM) 5 MG tablet    Sig: Limit  1/2  tablet by mouth per day or every other day if tolerated    Dispense:  15 tablet    Refill:  0       Follow-up: Return in about 1 month (around 05/12/2016) for evaluation, med refill.    Molli Barrows, MD  This dictation was performed utilizing Dragon voice recognition software.  Please excuse any unintentional or  mistaken typographical errors as a result of its unedited utilization.

## 2016-04-20 ENCOUNTER — Ambulatory Visit: Payer: Medicare Other | Admitting: Anesthesiology

## 2016-05-12 ENCOUNTER — Encounter: Payer: Self-pay | Admitting: Anesthesiology

## 2016-05-12 ENCOUNTER — Ambulatory Visit: Payer: Medicare Other | Attending: Anesthesiology | Admitting: Anesthesiology

## 2016-05-12 VITALS — BP 141/79 | Temp 97.5°F | Resp 16 | Ht 68.0 in | Wt 180.0 lb

## 2016-05-12 DIAGNOSIS — M5136 Other intervertebral disc degeneration, lumbar region: Secondary | ICD-10-CM | POA: Diagnosis not present

## 2016-05-12 DIAGNOSIS — M19041 Primary osteoarthritis, right hand: Secondary | ICD-10-CM | POA: Diagnosis not present

## 2016-05-12 DIAGNOSIS — M797 Fibromyalgia: Secondary | ICD-10-CM | POA: Insufficient documentation

## 2016-05-12 DIAGNOSIS — Z87891 Personal history of nicotine dependence: Secondary | ICD-10-CM | POA: Diagnosis not present

## 2016-05-12 DIAGNOSIS — M19012 Primary osteoarthritis, left shoulder: Secondary | ICD-10-CM | POA: Insufficient documentation

## 2016-05-12 DIAGNOSIS — M19011 Primary osteoarthritis, right shoulder: Secondary | ICD-10-CM | POA: Insufficient documentation

## 2016-05-12 DIAGNOSIS — M19042 Primary osteoarthritis, left hand: Secondary | ICD-10-CM | POA: Diagnosis not present

## 2016-05-12 DIAGNOSIS — Z79899 Other long term (current) drug therapy: Secondary | ICD-10-CM | POA: Diagnosis not present

## 2016-05-12 DIAGNOSIS — G2581 Restless legs syndrome: Secondary | ICD-10-CM | POA: Insufficient documentation

## 2016-05-12 MED ORDER — HYDROCODONE-ACETAMINOPHEN 5-325 MG PO TABS: 1.0000 | ORAL_TABLET | Freq: Two times a day (BID) | ORAL | 0 refills | Status: DC

## 2016-05-12 MED ORDER — DIAZEPAM 5 MG PO TABS: ORAL_TABLET | ORAL | 0 refills | Status: DC

## 2016-05-12 NOTE — Progress Notes (Signed)
Safety precautions to be maintained throughout the outpatient stay will include: orient to surroundings, keep bed in low position, maintain call bell within reach at all times, provide assistance with transfer out of bed and ambulation.  Patient informed that she will need to bring pill bottles for count from now on.

## 2016-05-12 NOTE — Progress Notes (Signed)
Subjective:  Patient ID: Carrie Myers, female    DOB: Jul 13, 1945  Age: 71 y.o. MRN: [id number]  CC: Back Pain (low); Arm Pain (bilateral); and Neck Pain (left side is worse)   Service Provided on Last Visit: Med Refill, Evaluation  PROCEDURE:None  HPI ZORAIDA HAVRILLA presents for reevaluation last seen in January. Since that time her pain has persisted but has been under reasonable control with her medication management. She currently takes a hydrocodone tablet once a day with some when necessary Ultram and this combination seems to work well to keep her low back and upper extremity and neck pain under control. She has recently had some problems with some muscular type pain involving the left hip that is worse when she goes from seated to standing but no changes in strength affecting the lower extremities or problems with bowel or bladder dysfunction. The pain that she has in the left hip is primarily a spasming pain occasionally gnawing in nature and also improving with the medications.  By history she presents as a previous patient of Dr. Belenda Cruise crisp. She desires to keep her pain management here in town and has asked to be referred to me for evaluation. She has a long-standing history of low back pain and diffuse systemic body pain for 17 years and is on medication management for that. She describes pain in the low back which is her primary pain and diffuse shoulder posterior shoulder hip buttock and lower extremity pain consistent with fibromyalgia as well. Her low back pain radiates into the left lower extremity and she has had previous epidural steroid injections for that which has effectively relieved her sciatica. She has some of the symptoms now but not severe and currently manageable. She also has intermittent neck and shoulder pain and has had previous injection therapy for that without significant relief. She describes pain that's active throughout the day and aggravated by  bending kneeling lifting motion and standing. Twisting also aggravates the pain and alleviating factors include rest and medication management and epidural steroids for her back pain. The pain is agonizing generally consistent throughout the day and only managed well with her medication management and occasionally with some of her exercises. She has associated intermittent weakness to the lower extremities as well. As mentioned she has had previous epidural steroid injections and a previous MRI with no significant changes in her symptoms since her MRI.  History Damita has a past medical history of Allergy; Anxiety; Arthritis; Bilateral dry eyes; Dental crowns present; Fibromyalgia; GERD (gastroesophageal reflux disease); Hyperlipidemia; Hypertension; Kidney stones; Myocardial infarction (04/22/2006); Osteoarthritis; Rheumatoid arthritis ([place]); and Thyroid disease.   She has a past surgical history that includes Appendectomy; Cesarean section; Abdominal hysterectomy; Bunionectomy (Bilateral); dental implant; Colonoscopy (2010); Coronary angioplasty with stent (04/2006); Shoulder arthroscopy (Right, 05/07/2015); Breast biopsy (Right); and Breast cyst aspiration (Left).   Her family history includes Diabetes in her sister; Heart disease in her father; Pancreatic cancer in her sister; Stroke in her mother.She reports that she has quit smoking. Her smoking use included Cigarettes. She quit smokeless tobacco use about 10 years ago. She reports that she does not drink alcohol or use drugs.  Results for orders placed in visit on 11/11/09  MR [place] W/O Cm   Narrative * PRIOR REPORT IMPORTED FROM AN EXTERNAL SYSTEM *   PRIOR REPORT IMPORTED FROM THE [facility] WORKFLOW SYSTEM   REASON FOR EXAM:    sciatica pain  COMMENTS:   PROCEDURE:  MMR - MMR LUMBAR SPINE WO CONTRAST  - Nov 11 2009  9:50AM   RESULT:     MRI LUMBAR SPINE WITHOUT CONTRAST   HISTORY: Sciatic pain   COMPARISON: None   TECHNIQUE:  Multiplanar and multisequence MRI of the lumbar spine were  obtained, without administration of IV contrast.   FINDINGS:   The vertebral body heights are maintained. There is grade 1  anterolisthesis  of L4 on L5. There is normal bone marrow signal demonstrated throughout  the  vertebra. The intervertebral disc spaces are well-maintained.   The spinal cord is of normal volume and contour. The cord terminates  normally at L1 . The nerve roots of the cauda equina and the filum  terminale  have the usual appearance.   The visualized portions of the SI joints are unremarkable.   The imaged intra-abdominal contents are unremarkable.   T11-T12: There is a small left paracentral disc protrusion.   T12-L1: No significant disc bulge. No evidence of neural foraminal or  central stenosis.   L1-L2: No significant disc bulge. No evidence of neural foraminal or  central  stenosis.   L2-L3: No significant disc bulge. No evidence of neural foraminal or  central  stenosis.   L3-L4: Mild broad-based disc bulge with an annular tear. No evidence of  neural foraminal or central stenosis.   L4-L5: Mild broad-based disc bulge. Severe bilateral facet arthropathy  with  ligamentum flavum infolding resulting in moderate spinal stenosis. There  is  moderate left foraminal stenosis.   L5-S1: A tiny central disc protrusion without significant impression on  the  ventral thecal sac. No evidence of neural foraminal or central stenosis.   IMPRESSION:   1. Lumbar spine spondylosis as described above.        [employer] Select 13  Date Value Ref Range Status  06/24/2015 FINAL  Final    Comment:    ==================================================================== [employer] SELECT 13 (MW) ==================================================================== Test                             Result       Flag       Units Drug Present and Declared for Prescription Verification    Desmethyldiazepam              477          EXPECTED   ng/mg creat   Oxazepam                       >3425        EXPECTED   ng/mg creat   Temazepam                      737          EXPECTED   ng/mg creat    Desmethyldiazepam, oxazepam, and temazepam are expected    metabolites of diazepam. Desmethyldiazepam and oxazepam are also    expected metabolites of other drugs, including chlordiazepoxide,    prazepam, clorazepate, and halazepam. Oxazepam is an expected    metabolite of temazepam. Oxazepam and temazepam are also    available as scheduled prescription medications.   Hydrocodone                    208          EXPECTED   ng/mg creat   Norhydrocodone  84           EXPECTED   ng/mg creat    Sources of hydrocodone include scheduled prescription    medications. Norhydrocodone is an expected metabolite of    hydrocodone.   Tramadol                       PRESENT      EXPECTED   O-Desmethyltramadol            PRESENT      EXPECTED   N-Desmethyltramadol            PRESENT      EXPECTED    Source of tramadol is a prescription medication.    O-desmethyltramadol and N-desmethyltramadol are expected    metabolites of tramadol. ==================================================================== Test                      Result    Flag   Units      Ref Range   Creatinine              146              mg/dL      >=20 ==================================================================== Declared Medications:  The flagging and interpretation on this report are based on the  following declared medications.  Unexpected results may arise from  inaccuracies in the declared medications.  **Note: The testing scope of this panel includes these medications:  Diazepam (Valium)  Hydrocodone (Norco)  Hydrocodone (Vicodin)  Tramadol (Ultram)  **Note: The testing scope of this panel does not include following  reported medications:  Acetaminophen (Norco)  Acetaminophen (Vicodin)   Citalopram (Celexa)  Cyclobenzaprine (Flexeril)  Diclofenac (Diclofenac-Misoprostol)  Levofloxacin (Levaquin)  Levothyroxine (Synthroid)  Losartan (Cozaar)  Misoprostol (Diclofenac-Misoprostol)  Multivitamin (Centrum Silver)  Omeprazole (Nexium)  Pravastatin (Pravachol)  Supplement  Supplement (Centrum Silver) ==================================================================== For clinical consultation, please call [phone]. ====================================================================     Outpatient Medications Prior to Visit  Medication Sig Dispense Refill  . citalopram (CELEXA) 20 MG tablet TAKE 1 TABLET BY MOUTH  DAILY 90 tablet 3  . Diclofenac-Misoprostol 75-0.2 MG TBEC TAKE 1 TABLET BY MOUTH TWO  TIMES DAILY 180 tablet 3  . esomeprazole (NEXIUM) 20 MG capsule Take 40 mg by mouth daily. Over the Counter    . HYDROcodone-homatropine (HYCODAN) 5-1.5 MG/5ML syrup Take 5 mLs by mouth every 6 (six) hours as needed for cough. 180 mL 0  . levothyroxine (SYNTHROID, LEVOTHROID) 88 MCG tablet TAKE 1 TABLET BY MOUTH  DAILY 90 tablet 3  . losartan (COZAAR) 50 MG tablet Take 50 mg by mouth daily.    Marland Kitchen oseltamivir (TAMIFLU) 75 MG capsule Take 1 capsule (75 mg total) by mouth 2 (two) times daily. 10 capsule 0  . pravastatin (PRAVACHOL) 80 MG tablet Take 80 mg by mouth daily.    . predniSONE (DELTASONE) 10 MG tablet Take 6 on day 1, 5 on day 2, 4 on day 3, 3 on day 4, 2 on day 5 and 1 on day 1 then stop. 21 tablet 0  . traMADol (ULTRAM) 50 MG tablet Limit 1 tablet by mouth up to three times per day for breakthrough pain while taking hydrocodone acetaminophen as needed for severe pain 90 tablet 3  . diazepam (VALIUM) 5 MG tablet Limit  1/2  tablet by mouth per day or every other day if tolerated 15 tablet 0  . HYDROcodone-acetaminophen (NORCO/VICODIN) 5-325 MG tablet Take 1 tablet  by mouth 2 (two) times daily at 10 AM and 5 PM. Limit 1-2 tablets by mouth per day if tolerated 30  tablet 0  . cefdinir (OMNICEF) 300 MG capsule Take 1 capsule (300 mg total) by mouth 2 (two) times daily. (Patient not taking: Reported on 04/14/2016) 20 capsule 0   No facility-administered medications prior to visit.    Lab Results  Component Value Date   WBC 5.8 03/16/2016   HGB 13.3 01/30/2014   HCT 39.4 03/16/2016   PLT 215 03/16/2016   GLUCOSE 92 03/16/2016   CHOL 119 03/16/2016   TRIG 154 (H) 03/16/2016   HDL 41 03/16/2016   LDLCALC 47 03/16/2016   ALT 30 03/16/2016   AST 27 03/16/2016   NA 141 03/16/2016   K 4.6 03/16/2016   CL 99 03/16/2016   CREATININE 0.86 03/16/2016   BUN 15 03/16/2016   CO2 27 03/16/2016   TSH 6.140 (H) 03/16/2016    --------------------------------------------------------------------------------------------------------------------- No results found.     ---------------------------------------------------------------------------------------------------------------------- Past Medical History:  Diagnosis Date  . Allergy   . Anxiety   . Arthritis    neck, hands, lower back  . Bilateral dry eyes   . Dental crowns present    Implants  . Fibromyalgia   . GERD (gastroesophageal reflux disease)   . Hyperlipidemia   . Hypertension   . Kidney stones   . Myocardial infarction 04/22/2006  . Osteoarthritis   . Rheumatoid arthritis ([place])   . Thyroid disease     Past Surgical History:  Procedure Laterality Date  . ABDOMINAL HYSTERECTOMY     total  . APPENDECTOMY    . BREAST BIOPSY Right    neg  . BREAST CYST ASPIRATION Left    neg  . BUNIONECTOMY Bilateral   . CESAREAN SECTION    . COLONOSCOPY  2010   normal  . CORONARY ANGIOPLASTY WITH STENT PLACEMENT  04/2006  . dental implant     [age]  . SHOULDER ARTHROSCOPY Right 05/07/2015   Procedure: Extensive arthroscopic debridement and arthroscopic subacromial decompression, right shoulder.  2. Steroid injection right thumb CMC joint.;  Surgeon: Corky Mull, MD;   Location: [facility];  Service: Orthopedics;  Laterality: Right;    Family History  Problem Relation Age of Onset  . Stroke Mother   . Heart disease Father   . Pancreatic cancer Sister   . Diabetes Sister   . Breast cancer Neg Hx     Social History  Substance Use Topics  . Smoking status: Former Smoker    Types: Cigarettes  . Smokeless tobacco: Former Systems developer    Quit date: 04/22/2006  . Alcohol use No    ---------------------------------------------------------------------------------------------------------------------- Social History   Social History  . Marital status: Widowed    Spouse name: N/A  . Number of children: N/A  . Years of education: N/A   Social History Main Topics  . Smoking status: Former Smoker    Types: Cigarettes  . Smokeless tobacco: Former Systems developer    Quit date: 04/22/2006  . Alcohol use No  . Drug use: No  . Sexual activity: Not Asked   Other Topics Concern  . None   Social History Narrative  . None    Scheduled Meds: Continuous Infusions: PRN Meds:.   BP (!) 141/79   Temp 97.5 F (36.4 C) (Oral)   Resp 16   Ht 5' 8"  (1.727 m)   Wt 180 lb (81.6 kg)   SpO2 98%   BMI 27.37  kg/m    BP Readings from Last 3 Encounters:  05/12/16 (!) 141/79  04/14/16 (!) 144/78  04/05/16 131/88     Wt Readings from Last 3 Encounters:  05/12/16 180 lb (81.6 kg)  04/14/16 180 lb (81.6 kg)  04/05/16 180 lb (81.6 kg)     ----------------------------------------------------------------------------------------------------------------------  ROS Review of Systems  Cardiac: High blood pressure previous heart attack in 2008 and previous stent. GI: Minimal constipation  Objective:  BP (!) 141/79   Temp 97.5 F (36.4 C) (Oral)   Resp 16   Ht 5' 8"  (1.727 m)   Wt 180 lb (81.6 kg)   SpO2 98%   BMI 27.37 kg/m   Physical Exam Patient is alert oriented cooperative compliant and a good historian. Her heart is regular rate and rhythm without  murmur Lungs are clear To auscultation with no rales or wheezing. Inspection of the neck reveals some mild paraspinous muscle tenderness and tenderness in the trapezius but no trigger points. She continues to have some mild lumbar paraspinous spasm but otherwise her exam is unchanged.. She does have mild pain with abduction and abduction moves about the left hip joint.   Assessment & Plan:   There are no diagnoses linked to this encounter.   ----------------------------------------------------------------------------------------------------------------------  Problem List Items Addressed This Visit    None      ----------------------------------------------------------------------------------------------------------------------  1. Fibromyalgia We have reviewed the practitioner database and it is appropriate we will refill her medication for her Vicodin and Ultram with a return to clinic in 1 month. She has had a recent upper respiratory infection but her lungs are clear and I have encouraged her to follow-up with her primary care physician for further management.  2. DDD (degenerative disc disease), lumbar We may ultimately consider an epidural steroid for any refractory sciatica  3. Sacroiliac joint disease   4. Primary osteoarthritis of both shoulders I've given her a few exercises for her left hip but informed her that she needs to follow-up with an orthopedic doctor for possible x-rays if this is not better within a month.   5. Primary osteoarthritis of both hands   6. Facet syndrome, lumbar   7. Cervical facet joint syndrome  8.  Restless leg syndrome. I'm going to prescribe Valium 5 mg tablets one half daily at bedtime to assist. She is to return to clinic in 1-2 months for reevaluation.     ----------------------------------------------------------------------------------------------------------------------  I am having Ms. Jersey maintain her esomeprazole,  losartan, pravastatin, citalopram, Diclofenac-Misoprostol, traMADol, oseltamivir, HYDROcodone-homatropine, predniSONE, levothyroxine, cefdinir, HYDROcodone-acetaminophen, and diazepam.   Meds ordered this encounter  Medications  . HYDROcodone-acetaminophen (NORCO/VICODIN) 5-325 MG tablet    Sig: Take 1 tablet by mouth 2 (two) times daily at 10 AM and 5 PM. Limit 1-2 tablets by mouth per day if tolerated    Dispense:  30 tablet    Refill:  0    Do not fill until [id number]  . diazepam (VALIUM) 5 MG tablet    Sig: Limit  1/2  tablet by mouth per day or every other day if tolerated    Dispense:  15 tablet    Refill:  0       Follow-up: Return in about 1 month (around 06/09/2016) for evaluation, med refill.    Molli Barrows, MD  This dictation was performed utilizing Dragon voice recognition software.  Please excuse any unintentional or mistaken typographical errors as a result of its unedited utilization.

## 2016-05-20 LAB — TOXASSURE SELECT 13 (MW), URINE

## 2016-06-09 ENCOUNTER — Ambulatory Visit: Payer: Medicare Other | Attending: Anesthesiology | Admitting: Anesthesiology

## 2016-06-09 ENCOUNTER — Encounter: Payer: Self-pay | Admitting: Anesthesiology

## 2016-06-09 VITALS — BP 120/78 | HR 100 | Temp 98.0°F | Resp 16 | Ht 68.0 in | Wt 180.0 lb

## 2016-06-09 DIAGNOSIS — M1288 Other specific arthropathies, not elsewhere classified, other specified site: Secondary | ICD-10-CM | POA: Diagnosis not present

## 2016-06-09 DIAGNOSIS — M19012 Primary osteoarthritis, left shoulder: Secondary | ICD-10-CM | POA: Insufficient documentation

## 2016-06-09 DIAGNOSIS — Z87891 Personal history of nicotine dependence: Secondary | ICD-10-CM | POA: Diagnosis not present

## 2016-06-09 DIAGNOSIS — M19041 Primary osteoarthritis, right hand: Secondary | ICD-10-CM | POA: Diagnosis not present

## 2016-06-09 DIAGNOSIS — M19042 Primary osteoarthritis, left hand: Secondary | ICD-10-CM | POA: Insufficient documentation

## 2016-06-09 DIAGNOSIS — J09X2 Influenza due to identified novel influenza A virus with other respiratory manifestations: Secondary | ICD-10-CM | POA: Diagnosis not present

## 2016-06-09 DIAGNOSIS — J101 Influenza due to other identified influenza virus with other respiratory manifestations: Secondary | ICD-10-CM | POA: Diagnosis not present

## 2016-06-09 DIAGNOSIS — M47816 Spondylosis without myelopathy or radiculopathy, lumbar region: Secondary | ICD-10-CM

## 2016-06-09 DIAGNOSIS — M19011 Primary osteoarthritis, right shoulder: Secondary | ICD-10-CM | POA: Insufficient documentation

## 2016-06-09 DIAGNOSIS — M797 Fibromyalgia: Secondary | ICD-10-CM

## 2016-06-09 DIAGNOSIS — Z79899 Other long term (current) drug therapy: Secondary | ICD-10-CM | POA: Diagnosis not present

## 2016-06-09 DIAGNOSIS — G2581 Restless legs syndrome: Secondary | ICD-10-CM | POA: Insufficient documentation

## 2016-06-09 DIAGNOSIS — M5136 Other intervertebral disc degeneration, lumbar region: Secondary | ICD-10-CM | POA: Insufficient documentation

## 2016-06-09 MED ORDER — DIAZEPAM 5 MG PO TABS: ORAL_TABLET | ORAL | 0 refills | Status: DC

## 2016-06-09 MED ORDER — TRAMADOL HCL 50 MG PO TABS: ORAL_TABLET | ORAL | 3 refills | Status: DC

## 2016-06-09 MED ORDER — HYDROCODONE-ACETAMINOPHEN 5-325 MG PO TABS: 1.0000 | ORAL_TABLET | Freq: Two times a day (BID) | ORAL | 0 refills | Status: DC

## 2016-06-09 NOTE — Progress Notes (Signed)
Safety precautions to be maintained throughout the outpatient stay will include: orient to surroundings, keep bed in low position, maintain call bell within reach at all times, provide assistance with transfer out of bed and ambulation.  

## 2016-06-09 NOTE — Progress Notes (Signed)
Patient reminded that she will need to bring pain medicine for count at next visit.

## 2016-06-11 NOTE — Progress Notes (Signed)
Subjective:  Patient ID: Carrie Myers, female    DOB: [birth year]  Age: 71 y.o. MRN: [id number]  CC: Back Pain (lower ); Joint Pain (arthritis); and Fibromyalgia   Service Provided on Last Visit: Med Refill  PROCEDURE:None  HPI Carrie Myers reports for reevaluation today. She has been doing well with the regimen and has been L to reduce her Vicodin usage. She is presently utilizing Vicodin approximately once a day and uses half of a Valium tablet daily. She relies on Ultram to 2 mg tablets 3 times a day to help with her diffuse pain and fibromyalgia. This regimen seems to be working well for her. Based on her narcotic assessment sheet she is deriving good benefit and relief with this. We have reviewed to [facility] practitioner database and it is appropriate.   By history she presents as a previous patient of Dr. Belenda Cruise crisp. She desires to keep her pain management here in town and has asked to be referred to me for evaluation. She has a long-standing history of low back pain and diffuse systemic body pain for 17 years and is on medication management for that. She describes pain in the low back which is her primary pain and diffuse shoulder posterior shoulder hip buttock and lower extremity pain consistent with fibromyalgia as well. Her low back pain radiates into the left lower extremity and she has had previous epidural steroid injections for that which has effectively relieved her sciatica. She has some of the symptoms now but not severe and currently manageable. She also has intermittent neck and shoulder pain and has had previous injection therapy for that without significant relief. She describes pain that's active throughout the day and aggravated by bending kneeling lifting motion and standing. Twisting also aggravates the pain and alleviating factors include rest and medication management and epidural steroids for her back pain. The pain is agonizing generally consistent  throughout the day and only managed well with her medication management and occasionally with some of her exercises. She has associated intermittent weakness to the lower extremities as well. As mentioned she has had previous epidural steroid injections and a previous MRI with no significant changes in her symptoms since her MRI.  History Carrie Myers has a past medical history of Allergy; Anxiety; Arthritis; Bilateral dry eyes; Dental crowns present; Fibromyalgia; GERD (gastroesophageal reflux disease); Hyperlipidemia; Hypertension; Kidney stones; Myocardial infarction (04/22/2006); Osteoarthritis; Rheumatoid arthritis ([place]); and Thyroid disease.   She has a past surgical history that includes Appendectomy; Cesarean section; Abdominal hysterectomy; Bunionectomy (Bilateral); dental implant; Colonoscopy (2010); Coronary angioplasty with stent (04/2006); Shoulder arthroscopy (Right, 05/07/2015); Breast biopsy (Right); and Breast cyst aspiration (Left).   Her family history includes Diabetes in her sister; Heart disease in her father; Pancreatic cancer in her sister; Stroke in her mother.She reports that she has quit smoking. Her smoking use included Cigarettes. She quit smokeless tobacco use about 10 years ago. She reports that she does not drink alcohol or use drugs.  Results for orders placed in visit on 11/11/09  MR Spreckels W/O Cm   Narrative * PRIOR REPORT IMPORTED FROM AN EXTERNAL SYSTEM *   PRIOR REPORT IMPORTED FROM THE SYNGO WORKFLOW SYSTEM   REASON FOR EXAM:    sciatica pain  COMMENTS:   PROCEDURE:     MMR - MMR LUMBAR SPINE WO CONTRAST  - Nov 11 2009  9:50AM   RESULT:     MRI LUMBAR SPINE WITHOUT CONTRAST   HISTORY: Sciatic pain  COMPARISON: None   TECHNIQUE: Multiplanar and multisequence MRI of the lumbar spine were  obtained, without administration of IV contrast.   FINDINGS:   The vertebral body heights are maintained. There is grade 1  anterolisthesis  of L4 on L5. There is  normal bone marrow signal demonstrated throughout  the  vertebra. The intervertebral disc spaces are well-maintained.   The spinal cord is of normal volume and contour. The cord terminates  normally at L1 . The nerve roots of the cauda equina and the filum  terminale  have the usual appearance.   The visualized portions of the SI joints are unremarkable.   The imaged intra-abdominal contents are unremarkable.   T11-T12: There is a small left paracentral disc protrusion.   T12-L1: No significant disc bulge. No evidence of neural foraminal or  central stenosis.   L1-L2: No significant disc bulge. No evidence of neural foraminal or  central  stenosis.   L2-L3: No significant disc bulge. No evidence of neural foraminal or  central  stenosis.   L3-L4: Mild broad-based disc bulge with an annular tear. No evidence of  neural foraminal or central stenosis.   L4-L5: Mild broad-based disc bulge. Severe bilateral facet arthropathy  with  ligamentum flavum infolding resulting in moderate spinal stenosis. There  is  moderate left foraminal stenosis.   L5-S1: A tiny central disc protrusion without significant impression on  the  ventral thecal sac. No evidence of neural foraminal or central stenosis.   IMPRESSION:   1. Lumbar spine spondylosis as described above.        ToxAssure Select 13  Date Value Ref Range Status  05/12/2016 FINAL  Final    Comment:    ==================================================================== TOXASSURE SELECT 13 (MW) ==================================================================== Test                             Result       Flag       Units Drug Present and Declared for Prescription Verification   Desmethyldiazepam              220          EXPECTED   ng/mg creat   Oxazepam                       417          EXPECTED   ng/mg creat   Temazepam                      286          EXPECTED   ng/mg creat    Desmethyldiazepam, oxazepam, and  temazepam are expected    metabolites of diazepam. Desmethyldiazepam and oxazepam are also    expected metabolites of other drugs, including chlordiazepoxide,    prazepam, clorazepate, and halazepam. Oxazepam is an expected    metabolite of temazepam. Oxazepam and temazepam are also    available as scheduled prescription medications.   Tramadol                       PRESENT      EXPECTED   O-Desmethyltramadol            PRESENT      EXPECTED   N-Desmethyltramadol            PRESENT      EXPECTED    Source  of tramadol is a prescription medication.    O-desmethyltramadol and N-desmethyltramadol are expected    metabolites of tramadol. Drug Absent but Declared for Prescription Verification   Hydrocodone                    Not Detected UNEXPECTED ng/mg creat ==================================================================== Test                      Result    Flag   Units      Ref Range   Creatinine              35               mg/dL      >=20 ==================================================================== Declared Medications:  The flagging and interpretation on this report are based on the  following declared medications.  Unexpected results may arise from  inaccuracies in the declared medications.  **Note: The testing scope of this panel includes these medications:  Diazepam (Valium)  Hydrocodone (Hycodan)  Hydrocodone (Hydrocodone-Acetaminophen)  Tramadol  **Note: The testing scope of this panel does not include following  reported medications:  Acetaminophen (Hydrocodone-Acetaminophen)  Cefdinir  Citalopram (Celexa)  Diclofenac  Homatropine (Hycodan)  Levothyroxine  Losartan (Cozaar)  Omeprazole (Nexium)  Oseltamivir (Tamiflu)  Pravastatin  Prednisone (Deltasone) ==================================================================== For clinical consultation, please call [phone]. ====================================================================      Outpatient Medications Prior to Visit  Medication Sig Dispense Refill  . citalopram (CELEXA) 20 MG tablet TAKE 1 TABLET BY MOUTH  DAILY 90 tablet 3  . Diclofenac-Misoprostol 75-0.2 MG TBEC TAKE 1 TABLET BY MOUTH TWO  TIMES DAILY 180 tablet 3  . esomeprazole (NEXIUM) 20 MG capsule Take 40 mg by mouth daily. Over the Counter    . HYDROcodone-homatropine (HYCODAN) 5-1.5 MG/5ML syrup Take 5 mLs by mouth every 6 (six) hours as needed for cough. 180 mL 0  . levothyroxine (SYNTHROID, LEVOTHROID) 88 MCG tablet TAKE 1 TABLET BY MOUTH  DAILY 90 tablet 3  . losartan (COZAAR) 50 MG tablet Take 50 mg by mouth daily.    Marland Kitchen oseltamivir (TAMIFLU) 75 MG capsule Take 1 capsule (75 mg total) by mouth 2 (two) times daily. 10 capsule 0  . pravastatin (PRAVACHOL) 80 MG tablet Take 80 mg by mouth daily.    . predniSONE (DELTASONE) 10 MG tablet Take 6 on day 1, 5 on day 2, 4 on day 3, 3 on day 4, 2 on day 5 and 1 on day 1 then stop. 21 tablet 0  . diazepam (VALIUM) 5 MG tablet Limit  1/2  tablet by mouth per day or every other day if tolerated 15 tablet 0  . HYDROcodone-acetaminophen (NORCO/VICODIN) 5-325 MG tablet Take 1 tablet by mouth 2 (two) times daily at 10 AM and 5 PM. Limit 1-2 tablets by mouth per day if tolerated 30 tablet 0  . traMADol (ULTRAM) 50 MG tablet Limit 1 tablet by mouth up to three times per day for breakthrough pain while taking hydrocodone acetaminophen as needed for severe pain 90 tablet 3  . cefdinir (OMNICEF) 300 MG capsule Take 1 capsule (300 mg total) by mouth 2 (two) times daily. (Patient not taking: Reported on 04/14/2016) 20 capsule 0   No facility-administered medications prior to visit.    Lab Results  Component Value Date   WBC 5.8 03/16/2016   HGB 13.3 01/30/2014   HCT 39.4 03/16/2016   PLT 215 03/16/2016   GLUCOSE 92 03/16/2016   CHOL  119 03/16/2016   TRIG 154 (H) 03/16/2016   HDL 41 03/16/2016   LDLCALC 47 03/16/2016   ALT 30 03/16/2016   AST 27 03/16/2016   NA 141  03/16/2016   K 4.6 03/16/2016   CL 99 03/16/2016   CREATININE 0.86 03/16/2016   BUN 15 03/16/2016   CO2 27 03/16/2016   TSH 6.140 (H) 03/16/2016    --------------------------------------------------------------------------------------------------------------------- No results found.     ---------------------------------------------------------------------------------------------------------------------- Past Medical History:  Diagnosis Date  . Allergy   . Anxiety   . Arthritis    neck, hands, lower back  . Bilateral dry eyes   . Dental crowns present    Implants  . Fibromyalgia   . GERD (gastroesophageal reflux disease)   . Hyperlipidemia   . Hypertension   . Kidney stones   . Myocardial infarction 04/22/2006  . Osteoarthritis   . Rheumatoid arthritis ([place])   . Thyroid disease     Past Surgical History:  Procedure Laterality Date  . ABDOMINAL HYSTERECTOMY     total  . APPENDECTOMY    . BREAST BIOPSY Right    neg  . BREAST CYST ASPIRATION Left    neg  . BUNIONECTOMY Bilateral   . CESAREAN SECTION    . COLONOSCOPY  2010   normal  . CORONARY ANGIOPLASTY WITH STENT PLACEMENT  04/2006  . dental implant     [age]  . SHOULDER ARTHROSCOPY Right 05/07/2015   Procedure: Extensive arthroscopic debridement and arthroscopic subacromial decompression, right shoulder.  2. Steroid injection right thumb CMC joint.;  Surgeon: Corky Mull, MD;  Location: [facility];  Service: Orthopedics;  Laterality: Right;    Family History  Problem Relation Age of Onset  . Stroke Mother   . Heart disease Father   . Pancreatic cancer Sister   . Diabetes Sister   . Breast cancer Neg Hx     Social History  Substance Use Topics  . Smoking status: Former Smoker    Types: Cigarettes  . Smokeless tobacco: Former Systems developer    Quit date: 04/22/2006  . Alcohol use No     ---------------------------------------------------------------------------------------------------------------------- Social History   Social History  . Marital status: Widowed    Spouse name: N/A  . Number of children: N/A  . Years of education: N/A   Social History Main Topics  . Smoking status: Former Smoker    Types: Cigarettes  . Smokeless tobacco: Former Systems developer    Quit date: 04/22/2006  . Alcohol use No  . Drug use: No  . Sexual activity: Not Asked   Other Topics Concern  . None   Social History Narrative  . None    Scheduled Meds: Continuous Infusions: PRN Meds:.   BP 120/78 (BP Location: Right Arm, Patient Position: Sitting, Cuff Size: Normal)   Pulse 100   Temp 98 F (36.7 C) (Oral)   Resp 16   Ht '5\' 8"'$  (1.727 m)   Wt 180 lb (81.6 kg)   SpO2 96%   BMI 27.37 kg/m    BP Readings from Last 3 Encounters:  06/09/16 120/78  05/12/16 (!) 141/79  04/14/16 (!) 144/78     Wt Readings from Last 3 Encounters:  06/09/16 180 lb (81.6 kg)  05/12/16 180 lb (81.6 kg)  04/14/16 180 lb (81.6 kg)     ----------------------------------------------------------------------------------------------------------------------  ROS Review of Systems  Cardiac: High blood pressure previous heart attack in 2008 and previous stent. GI: Minimal constipation  Objective:  BP 120/78 (BP Location: Right Arm,  Patient Position: Sitting, Cuff Size: Normal)   Pulse 100   Temp 98 F (36.7 C) (Oral)   Resp 16   Ht '5\' 8"'$  (1.727 m)   Wt 180 lb (81.6 kg)   SpO2 96%   BMI 27.37 kg/m   Physical Exam Patient is alert oriented cooperative compliant and a good historian. Her heart is regular rate and rhythm without murmur  she continues to have some mild lumbar paraspinous spasm but otherwise her exam is unchanged.. She does have mild pain with abduction and abduction moves about the left hip joint.   Assessment & Plan:   Carrie Myers was seen today for back pain, joint pain and  fibromyalgia.  Diagnoses and all orders for this visit:  DDD (degenerative disc disease), lumbar  Fibromyalgia  Facet syndrome, lumbar  Influenza A  Other orders -     traMADol (ULTRAM) 50 MG tablet; Limit 1 tablet by mouth up to three times per day for breakthrough pain while taking hydrocodone acetaminophen as needed for severe pain -     diazepam (VALIUM) 5 MG tablet; Limit  1/2  tablet by mouth per day or every other day if tolerated -     HYDROcodone-acetaminophen (NORCO/VICODIN) 5-325 MG tablet; Take 1 tablet by mouth 2 (two) times daily at 10 AM and 5 PM. Limit 1-2 tablets by mouth per day if tolerated     ----------------------------------------------------------------------------------------------------------------------  Problem List Items Addressed This Visit      Musculoskeletal and Integument   DDD (degenerative disc disease), lumbar - Primary   Relevant Medications   traMADol (ULTRAM) 50 MG tablet   HYDROcodone-acetaminophen (NORCO/VICODIN) 5-325 MG tablet   Facet syndrome, lumbar   Relevant Medications   traMADol (ULTRAM) 50 MG tablet   HYDROcodone-acetaminophen (NORCO/VICODIN) 5-325 MG tablet     Other   Fibromyalgia    Other Visit Diagnoses    Influenza A          ----------------------------------------------------------------------------------------------------------------------  1. Fibromyalgia We will keep her on the same medication regimen today. She is doing well with the Valium at one half tablet per day and her combination of medication seems to keep her pain under good control. She is to return to clinic in 1 month and we may ultimately consider a repeat series of epidural steroids in approximately 3 months. These have helped to keep her pain under generally good control and reduce her muscle spasms. She has these series periodically and she does report a mild worsening of her symptoms however she does not feel she is in need of initiating this  therapy yet. 2. DDD (degenerative disc disease), lumbar We may ultimately consider an epidural steroid for any refractory sciatica  3. Sacroiliac joint disease   4. Primary osteoarthritis of both shoulders I've given her a few exercises for her left hip but informed her that she needs to follow-up with an orthopedic doctor for possible x-rays if this is not better within a month.   5. Primary osteoarthritis of both hands   6. Facet syndrome, lumbar   7. Cervical facet joint syndrome  8.  Restless leg syndrome. I'm going to prescribe Valium 5 mg tablets one half daily at bedtime to assist. She is to return to clinic in 1-2 months for reevaluation.     ----------------------------------------------------------------------------------------------------------------------  I am having Carrie Myers maintain her esomeprazole, losartan, pravastatin, citalopram, Diclofenac-Misoprostol, oseltamivir, HYDROcodone-homatropine, predniSONE, levothyroxine, cefdinir, traMADol, diazepam, and HYDROcodone-acetaminophen.   Meds ordered this encounter  Medications  . traMADol (ULTRAM) 50  MG tablet    Sig: Limit 1 tablet by mouth up to three times per day for breakthrough pain while taking hydrocodone acetaminophen as needed for severe pain    Dispense:  90 tablet    Refill:  3  . diazepam (VALIUM) 5 MG tablet    Sig: Limit  1/2  tablet by mouth per day or every other day if tolerated    Dispense:  15 tablet    Refill:  0  . HYDROcodone-acetaminophen (NORCO/VICODIN) 5-325 MG tablet    Sig: Take 1 tablet by mouth 2 (two) times daily at 10 AM and 5 PM. Limit 1-2 tablets by mouth per day if tolerated    Dispense:  30 tablet    Refill:  0       Follow-up: Return in about 1 month (around 07/10/2016) for evaluation.    Molli Barrows, MD   The [facility] practitioner database for opioids was reviewed today and her findings were appropriate.  This dictation was performed utilizing Actor.  Please excuse any unintentional or mistaken typographical errors as a result of its unedited utilization.

## 2016-06-24 ENCOUNTER — Other Ambulatory Visit: Payer: Self-pay | Admitting: Anesthesiology

## 2016-06-28 ENCOUNTER — Telehealth: Payer: Self-pay | Admitting: Anesthesiology

## 2016-06-28 NOTE — Telephone Encounter (Signed)
Pharmacy pulled up wrong script for refill. Back before she was coming to see Dr. Andree Elk, she received meds from Dr. Army Melia. Pharmacy had sent fax to Dr. Army Melia for a refill and they called patient. She wanted to make sure we knew she was not trying to fill this script and the pharmacy is supposed to call Dr. Oneal Deputy office and tell them is was a mistake. Just Fyi

## 2016-06-29 ENCOUNTER — Ambulatory Visit: Payer: Medicare Other | Admitting: Anesthesiology

## 2016-07-02 ENCOUNTER — Ambulatory Visit: Payer: Medicare Other | Attending: Anesthesiology | Admitting: Anesthesiology

## 2016-07-02 ENCOUNTER — Encounter: Payer: Self-pay | Admitting: Anesthesiology

## 2016-07-02 VITALS — BP 139/73 | HR 78 | Temp 97.5°F | Resp 16 | Ht 68.0 in | Wt 180.0 lb

## 2016-07-02 DIAGNOSIS — Z87891 Personal history of nicotine dependence: Secondary | ICD-10-CM | POA: Insufficient documentation

## 2016-07-02 DIAGNOSIS — M545 Low back pain: Secondary | ICD-10-CM | POA: Insufficient documentation

## 2016-07-02 DIAGNOSIS — M19011 Primary osteoarthritis, right shoulder: Secondary | ICD-10-CM | POA: Diagnosis not present

## 2016-07-02 DIAGNOSIS — R531 Weakness: Secondary | ICD-10-CM | POA: Diagnosis not present

## 2016-07-02 DIAGNOSIS — M5136 Other intervertebral disc degeneration, lumbar region: Secondary | ICD-10-CM | POA: Diagnosis not present

## 2016-07-02 DIAGNOSIS — M19042 Primary osteoarthritis, left hand: Secondary | ICD-10-CM | POA: Insufficient documentation

## 2016-07-02 DIAGNOSIS — M533 Sacrococcygeal disorders, not elsewhere classified: Secondary | ICD-10-CM | POA: Diagnosis not present

## 2016-07-02 DIAGNOSIS — Z79899 Other long term (current) drug therapy: Secondary | ICD-10-CM | POA: Diagnosis not present

## 2016-07-02 DIAGNOSIS — M069 Rheumatoid arthritis, unspecified: Secondary | ICD-10-CM | POA: Insufficient documentation

## 2016-07-02 DIAGNOSIS — M4692 Unspecified inflammatory spondylopathy, cervical region: Secondary | ICD-10-CM

## 2016-07-02 DIAGNOSIS — M19012 Primary osteoarthritis, left shoulder: Secondary | ICD-10-CM | POA: Insufficient documentation

## 2016-07-02 DIAGNOSIS — M539 Dorsopathy, unspecified: Secondary | ICD-10-CM

## 2016-07-02 DIAGNOSIS — M19041 Primary osteoarthritis, right hand: Secondary | ICD-10-CM | POA: Insufficient documentation

## 2016-07-02 DIAGNOSIS — G2581 Restless legs syndrome: Secondary | ICD-10-CM | POA: Diagnosis not present

## 2016-07-02 DIAGNOSIS — M5387 Other specified dorsopathies, lumbosacral region: Secondary | ICD-10-CM

## 2016-07-02 DIAGNOSIS — M797 Fibromyalgia: Secondary | ICD-10-CM | POA: Insufficient documentation

## 2016-07-02 DIAGNOSIS — M47812 Spondylosis without myelopathy or radiculopathy, cervical region: Secondary | ICD-10-CM

## 2016-07-02 MED ORDER — DIAZEPAM 5 MG PO TABS: ORAL_TABLET | ORAL | 1 refills | Status: DC

## 2016-07-02 MED ORDER — HYDROCODONE-ACETAMINOPHEN 5-325 MG PO TABS: 1.0000 | ORAL_TABLET | Freq: Two times a day (BID) | ORAL | 0 refills | Status: DC

## 2016-07-02 NOTE — Progress Notes (Signed)
Subjective:  Patient ID: Carrie Myers, female    DOB: November 07, 1945  Age: 71 y.o. MRN: [id number]  CC: Back Pain; Neck Pain; Hip Pain (bilateral); and Fibromyalgia      PROCEDURE:None  HPI VANCE BELCOURT this is shown some intensification recently. She's taking her medications as prescribed and doing well with the regimen that combines Vicodin with Ultram for breakthrough pain and one half of a Valium tablet at bedtime. This keeps much of her low back spasming and pain under control. Otherwise she is in her usual state of health with no new changes in lower extremity strength or bowel or bladder dysfunction.   By history she presents as a previous patient of Dr. Belenda Cruise crisp. She desires to keep her pain management here in town and has asked to be referred to me for evaluation. She has a long-standing history of low back pain and diffuse systemic body pain for 17 years and is on medication management for that. She describes pain in the low back which is her primary pain and diffuse shoulder posterior shoulder hip buttock and lower extremity pain consistent with fibromyalgia as well. Her low back pain radiates into the left lower extremity and she has had previous epidural steroid injections for that which has effectively relieved her sciatica. She has some of the symptoms now but not severe and currently manageable. She also has intermittent neck and shoulder pain and has had previous injection therapy for that without significant relief. She describes pain that's active throughout the day and aggravated by bending kneeling lifting motion and standing. Twisting also aggravates the pain and alleviating factors include rest and medication management and epidural steroids for her back pain. The pain is agonizing generally consistent throughout the day and only managed well with her medication management and occasionally with some of her exercises. She has associated intermittent weakness to the  lower extremities as well. As mentioned she has had previous epidural steroid injections and a previous MRI with no significant changes in her symptoms since her MRI.  History Wynne has a past medical history of Allergy; Anxiety; Arthritis; Bilateral dry eyes; Dental crowns present; Fibromyalgia; GERD (gastroesophageal reflux disease); Hyperlipidemia; Hypertension; Kidney stones; Myocardial infarction ([place]) (04/22/2006); Osteoarthritis; Rheumatoid arthritis ([place]); and Thyroid disease.   She has a past surgical history that includes Appendectomy; Cesarean section; Abdominal hysterectomy; Bunionectomy (Bilateral); dental implant; Colonoscopy (2010); Coronary angioplasty with stent (04/2006); Shoulder arthroscopy (Right, 05/07/2015); Breast biopsy (Right); and Breast cyst aspiration (Left).   Her family history includes Diabetes in her sister; Heart disease in her father; Pancreatic cancer in her sister; Stroke in her mother.She reports that she has quit smoking. Her smoking use included Cigarettes. She quit smokeless tobacco use about 10 years ago. She reports that she does not drink alcohol or use drugs.  Results for orders placed in visit on 11/11/09  MR [place] W/O Cm   Narrative * PRIOR REPORT IMPORTED FROM AN EXTERNAL SYSTEM *   PRIOR REPORT IMPORTED FROM THE [facility] WORKFLOW SYSTEM   REASON FOR EXAM:    sciatica pain  COMMENTS:   PROCEDURE:     MMR - MMR LUMBAR SPINE WO CONTRAST  - Nov 11 2009  9:50AM   RESULT:     MRI LUMBAR SPINE WITHOUT CONTRAST   HISTORY: Sciatic pain   COMPARISON: None   TECHNIQUE: Multiplanar and multisequence MRI of the lumbar spine were  obtained, without administration of IV contrast.   FINDINGS:   The vertebral  body heights are maintained. There is grade 1  anterolisthesis  of L4 on L5. There is normal bone marrow signal demonstrated throughout  the  vertebra. The intervertebral disc spaces are well-maintained.   The spinal cord is of normal volume  and contour. The cord terminates  normally at L1 . The nerve roots of the cauda equina and the filum  terminale  have the usual appearance.   The visualized portions of the SI joints are unremarkable.   The imaged intra-abdominal contents are unremarkable.   T11-T12: There is a small left paracentral disc protrusion.   T12-L1: No significant disc bulge. No evidence of neural foraminal or  central stenosis.   L1-L2: No significant disc bulge. No evidence of neural foraminal or  central  stenosis.   L2-L3: No significant disc bulge. No evidence of neural foraminal or  central  stenosis.   L3-L4: Mild broad-based disc bulge with an annular tear. No evidence of  neural foraminal or central stenosis.   L4-L5: Mild broad-based disc bulge. Severe bilateral facet arthropathy  with  ligamentum flavum infolding resulting in moderate spinal stenosis. There  is  moderate left foraminal stenosis.   L5-S1: A tiny central disc protrusion without significant impression on  the  ventral thecal sac. No evidence of neural foraminal or central stenosis.   IMPRESSION:   1. Lumbar spine spondylosis as described above.        [employer]ox[employer] Select 13  Date Value Ref Range Status  05/12/2016 FINAL  Final    Comment:    ==================================================================== TOX[employer]SU[employer] SELECT 13 (MW) ==================================================================== Test                             Result       Flag       Units Drug Present and Declared for Prescription Verification   Desmethyldiazepam              220          EXPECTED   ng/mg creat   Oxazepam                       417          EXPECTED   ng/mg creat   Temazepam                      286          EXPECTED   ng/mg creat    Desmethyldiazepam, oxazepam, and temazepam are expected    metabolites of diazepam. Desmethyldiazepam and oxazepam are also    expected metabolites of other drugs, including  chlordiazepoxide,    prazepam, clorazepate, and halazepam. Oxazepam is an expected    metabolite of temazepam. Oxazepam and temazepam are also    available as scheduled prescription medications.   Tramadol                       PRESENT      EXPECTED   O-Desmethyltramadol            PRESENT      EXPECTED   N-Desmethyltramadol            PRESENT      EXPECTED    Source of tramadol is a prescription medication.    O-desmethyltramadol and N-desmethyltramadol are expected    metabolites of tramadol. Drug Absent but Declared for Prescription Verification  Hydrocodone                    Not Detected UNEXPECTED ng/mg creat ==================================================================== Test                      Result    Flag   Units      Ref Range   Creatinine              35               mg/dL      >=20 ==================================================================== Declared Medications:  The flagging and interpretation on this report are based on the  following declared medications.  Unexpected results may arise from  inaccuracies in the declared medications.  **Note: The testing scope of this panel includes these medications:  Diazepam (Valium)  Hydrocodone (Hycodan)  Hydrocodone (Hydrocodone-Acetaminophen)  Tramadol  **Note: The testing scope of this panel does not include following  reported medications:  Acetaminophen (Hydrocodone-Acetaminophen)  Cefdinir  Citalopram (Celexa)  Diclofenac  Homatropine (Hycodan)  Levothyroxine  Losartan (Cozaar)  Omeprazole (Nexium)  Oseltamivir (Tamiflu)  Pravastatin  Prednisone (Deltasone) ==================================================================== For clinical consultation, please call [phone]. ====================================================================     Outpatient Medications Prior to Visit  Medication Sig Dispense Refill  . citalopram (CELEXA) 20 MG tablet TAKE 1 TABLET BY MOUTH  DAILY 90 tablet  3  . Diclofenac-Misoprostol 75-0.2 MG TBEC TAKE 1 TABLET BY MOUTH TWO  TIMES DAILY 180 tablet 3  . esomeprazole (NEXIUM) 20 MG capsule Take 40 mg by mouth daily. Over the Counter    . HYDROcodone-homatropine (HYCODAN) 5-1.5 MG/5ML syrup Take 5 mLs by mouth every 6 (six) hours as needed for cough. 180 mL 0  . levothyroxine (SYNTHROID, LEVOTHROID) 88 MCG tablet TAKE 1 TABLET BY MOUTH  DAILY 90 tablet 3  . losartan (COZAAR) 50 MG tablet Take 50 mg by mouth daily.    . pravastatin (PRAVACHOL) 80 MG tablet Take 80 mg by mouth daily.    . predniSONE (DELTASONE) 10 MG tablet Take 6 on day 1, 5 on day 2, 4 on day 3, 3 on day 4, 2 on day 5 and 1 on day 1 then stop. 21 tablet 0  . traMADol (ULTRAM) 50 MG tablet TAKE 1 TABLET BY MOUTH UP TO THREE TIMES DAILY AS NEEDED FOR BREAKTHROUGH PAIN WHILE TAKING HYDROCODONE 90 tablet 0  . diazepam (VALIUM) 5 MG tablet Limit  1/2  tablet by mouth per day or every other day if tolerated 15 tablet 0  . HYDROcodone-acetaminophen (NORCO/VICODIN) 5-325 MG tablet Take 1 tablet by mouth 2 (two) times daily at 10 AM and 5 PM. Limit 1-2 tablets by mouth per day if tolerated 30 tablet 0  . cefdinir (OMNICEF) 300 MG capsule Take 1 capsule (300 mg total) by mouth 2 (two) times daily. (Patient not taking: Reported on 04/14/2016) 20 capsule 0  . oseltamivir (TAMIFLU) 75 MG capsule Take 1 capsule (75 mg total) by mouth 2 (two) times daily. (Patient not taking: Reported on 07/02/2016) 10 capsule 0   No facility-administered medications prior to visit.    Lab Results  Component Value Date   WBC 5.8 03/16/2016   HGB 13.3 01/30/2014   HCT 39.4 03/16/2016   PLT 215 03/16/2016   GLUCOSE 92 03/16/2016   CHOL 119 03/16/2016   TRIG 154 (H) 03/16/2016   HDL 41 03/16/2016   LDLCALC 47 03/16/2016   ALT 30 03/16/2016   AST 27 03/16/2016  NA 141 03/16/2016   K 4.6 03/16/2016   CL 99 03/16/2016   CREATININE 0.86 03/16/2016   BUN 15 03/16/2016   CO2 27 03/16/2016   TSH 6.140 (H)  03/16/2016    --------------------------------------------------------------------------------------------------------------------- No results found.     ---------------------------------------------------------------------------------------------------------------------- Past Medical History:  Diagnosis Date  . Allergy   . Anxiety   . Arthritis    neck, hands, lower back  . Bilateral dry eyes   . Dental crowns present    Implants  . Fibromyalgia   . GERD (gastroesophageal reflux disease)   . Hyperlipidemia   . Hypertension   . Kidney stones   . Myocardial infarction ([place]) 04/22/2006  . Osteoarthritis   . Rheumatoid arthritis ([place])   . Thyroid disease     Past Surgical History:  Procedure Laterality Date  . ABDOMINAL HYSTERECTOMY     total  . APPENDECTOMY    . BREAST BIOPSY Right    neg  . BREAST CYST ASPIRATION Left    neg  . BUNIONECTOMY Bilateral   . CESAREAN SECTION    . COLONOSCOPY  2010   normal  . CORONARY ANGIOPLASTY WITH STENT PLACEMENT  04/2006  . dental implant     [age]  . SHOULDER ARTHROSCOPY Right 05/07/2015   Procedure: Extensive arthroscopic debridement and arthroscopic subacromial decompression, right shoulder.  2. Steroid injection right thumb CMC joint.;  Surgeon: Corky Mull, MD;  Location: [facility];  Service: Orthopedics;  Laterality: Right;    Family History  Problem Relation Age of Onset  . Stroke Mother   . Heart disease Father   . Pancreatic cancer Sister   . Diabetes Sister   . Breast cancer Neg Hx     Social History  Substance Use Topics  . Smoking status: Former Smoker    Types: Cigarettes  . Smokeless tobacco: Former Systems developer    Quit date: 04/22/2006  . Alcohol use No    ---------------------------------------------------------------------------------------------------------------------- Social History   Social History  . Marital status: Widowed    Spouse name: N/A  . Number of children:  N/A  . Years of education: N/A   Social History Main Topics  . Smoking status: Former Smoker    Types: Cigarettes  . Smokeless tobacco: Former Systems developer    Quit date: 04/22/2006  . Alcohol use No  . Drug use: No  . Sexual activity: Not Asked   Other Topics Concern  . None   Social History Narrative  . None    Scheduled Meds: Continuous Infusions: PRN Meds:.   BP 139/73 (BP Location: Left Arm, Patient Position: Sitting, Cuff Size: Normal)   Pulse 78   Temp 97.5 F (36.4 C) (Oral)   Resp 16   Ht 5' 8"  (1.727 m)   Wt 180 lb (81.6 kg)   SpO2 99%   BMI 27.37 kg/m    BP Readings from Last 3 Encounters:  07/02/16 139/73  06/09/16 120/78  05/12/16 (!) 141/79     Wt Readings from Last 3 Encounters:  07/02/16 180 lb (81.6 kg)  06/09/16 180 lb (81.6 kg)  05/12/16 180 lb (81.6 kg)     ----------------------------------------------------------------------------------------------------------------------  ROS Review of Systems  Cardiac: High blood pressure previous heart attack in 2008 and previous stent. GI: Minimal constipation  Objective:  BP 139/73 (BP Location: Left Arm, Patient Position: Sitting, Cuff Size: Normal)   Pulse 78   Temp 97.5 F (36.4 C) (Oral)   Resp 16   Ht 5' 8"  (1.727 m)  Wt 180 lb (81.6 kg)   SpO2 99%   BMI 27.37 kg/m   Physical Exam Patient is alert oriented cooperative compliant and a good historian. Her heart is regular rate and rhythm without murmurShe ambulates with a minimally antalgic gait but her strength appears to be well-preserved   Assessment & Plan:   Danayah was seen today for back pain, neck pain, hip pain and fibromyalgia.  Diagnoses and all orders for this visit:  DDD (degenerative disc disease), lumbar -     Lumbar Epidural Injection; Future  Fibromyalgia  Cervical facet joint syndrome (HCC)  Primary osteoarthritis of both shoulders  Sacroiliac joint disease  Primary osteoarthritis of both hands  Sciatica  associated with disorder of lumbosacral spine -     Lumbar Epidural Injection; Future  Other orders -     diazepam (VALIUM) 5 MG tablet; Limit  1/2  tablet by mouth per day or every other day if tolerated -     Discontinue: HYDROcodone-acetaminophen (NORCO/VICODIN) 5-325 MG tablet; Take 1 tablet by mouth 2 (two) times daily at 10 AM and 5 PM. Limit 1-2 tablets by mouth per day if tolerated -     HYDROcodone-acetaminophen (NORCO/VICODIN) 5-325 MG tablet; Take 1 tablet by mouth 2 (two) times daily at 10 AM and 5 PM. Limit 1-2 tablets by mouth per day if tolerated     ----------------------------------------------------------------------------------------------------------------------  Problem List Items Addressed This Visit      Musculoskeletal and Integument   Arthritis of hand, degenerative   Relevant Medications   HYDROcodone-acetaminophen (NORCO/VICODIN) 5-325 MG tablet   DDD (degenerative disc disease), lumbar - Primary   Relevant Medications   HYDROcodone-acetaminophen (NORCO/VICODIN) 5-325 MG tablet   Other Relevant Orders   Lumbar Epidural Injection   DJD of shoulder   Relevant Medications   HYDROcodone-acetaminophen (NORCO/VICODIN) 5-325 MG tablet   Sacroiliac joint disease     Other   Fibromyalgia    Other Visit Diagnoses    Cervical facet joint syndrome (HCC)       Relevant Medications   HYDROcodone-acetaminophen (NORCO/VICODIN) 5-325 MG tablet   Sciatica associated with disorder of lumbosacral spine       Relevant Medications   diazepam (VALIUM) 5 MG tablet   Other Relevant Orders   Lumbar Epidural Injection      ----------------------------------------------------------------------------------------------------------------------  1. Fibromyalgia Refills for her medications were given today. We will have her return to clinic in 2 months at which point we will schedule her for an epidural steroid injection for her degenerative disc disease and L5 bilateral  radiculitis. Gone over the risks and benefits of the procedure with her in detail and her questions about answered. We have also reviewed the practitioner database per [facility] and it is appropriate. 2. DDD (degenerative disc disease), lumbar As above  3. Sacroiliac joint disease   4. Primary osteoarthritis of both shoulders  5. Primary osteoarthritis of both hands   6. Facet syndrome, lumbar   7. Cervical facet joint syndrome  8.  Restless leg syndrome. I'm going to prescribe Valium 5 mg tablets one half daily at bedtime to assist. She is to return to clinic in 1-2 months for reevaluation.     ----------------------------------------------------------------------------------------------------------------------  I am having Ms. Weimer maintain her esomeprazole, losartan, pravastatin, citalopram, Diclofenac-Misoprostol, oseltamivir, HYDROcodone-homatropine, predniSONE, levothyroxine, cefdinir, traMADol, diazepam, and HYDROcodone-acetaminophen.   Meds ordered this encounter  Medications  . diazepam (VALIUM) 5 MG tablet    Sig: Limit  1/2  tablet by mouth per day or  every other day if tolerated    Dispense:  15 tablet    Refill:  1    Do not fill until [id number]  . DISCONTD: HYDROcodone-acetaminophen (NORCO/VICODIN) 5-325 MG tablet    Sig: Take 1 tablet by mouth 2 (two) times daily at 10 AM and 5 PM. Limit 1-2 tablets by mouth per day if tolerated    Dispense:  30 tablet    Refill:  0    [id number]  . HYDROcodone-acetaminophen (NORCO/VICODIN) 5-325 MG tablet    Sig: Take 1 tablet by mouth 2 (two) times daily at 10 AM and 5 PM. Limit 1-2 tablets by mouth per day if tolerated    Dispense:  30 tablet    Refill:  0    Do not fill until [id number]       Follow-up: Return in about 2 months (around 09/01/2016) for evaluation, procedure.    Molli Barrows, MD   The [facility] practitioner database for opioids was reviewed today and her findings were  appropriate.  This dictation was performed utilizing Systems analyst.  Please excuse any unintentional or mistaken typographical errors as a result of its unedited utilization.

## 2016-07-02 NOTE — Patient Instructions (Signed)
Epidural Steroid Injection Patient Information  Description: The epidural space surrounds the nerves as they exit the spinal cord.  In some patients, the nerves can be compressed and inflamed by a bulging disc or a tight spinal canal (spinal stenosis).  By injecting steroids into the epidural space, we can bring irritated nerves into direct contact with a potentially helpful medication.  These steroids act directly on the irritated nerves and can reduce swelling and inflammation which often leads to decreased pain.  Epidural steroids may be injected anywhere along the spine and from the neck to the low back depending upon the location of your pain.   After numbing the skin with local anesthetic (like Novocaine), a small needle is passed into the epidural space slowly.  You may experience a sensation of pressure while this is being done.  The entire block usually last less than 10 minutes.  Conditions which may be treated by epidural steroids:   Low back and leg pain  Neck and arm pain  Spinal stenosis  Post-laminectomy syndrome  Herpes zoster (shingles) pain  Pain from compression fractures  Preparation for the injection:  1. Do not eat any solid food or dairy products within 8 hours of your appointment.  2. You may drink clear liquids up to 3 hours before appointment.  Clear liquids include water, black coffee, juice or soda.  No milk or cream please. 3. You may take your regular medication, including pain medications, with a sip of water before your appointment  Diabetics should hold regular insulin (if taken separately) and take 1/2 normal NPH dos the morning of the procedure.  Carry some sugar containing items with you to your appointment. 4. A driver must accompany you and be prepared to drive you home after your procedure.  5. Bring all your current medications with your. 6. An IV may be inserted and sedation may be given at the discretion of the physician.   7. A blood pressure  cuff, EKG and other monitors will often be applied during the procedure.  Some patients may need to have extra oxygen administered for a short period. 8. You will be asked to provide medical information, including your allergies, prior to the procedure.  We must know immediately if you are taking blood thinners (like Coumadin/Warfarin)  Or if you are allergic to IV iodine contrast (dye). We must know if you could possible be pregnant.  Possible side-effects:  Bleeding from needle site  Infection (rare, may require surgery)  Nerve injury (rare)  Numbness & tingling (temporary)  Difficulty urinating (rare, temporary)  Spinal headache ( a headache worse with upright posture)  Light -headedness (temporary)  Pain at injection site (several days)  Decreased blood pressure (temporary)  Weakness in arm/leg (temporary)  Pressure sensation in back/neck (temporary)  Call if you experience:  Fever/chills associated with headache or increased back/neck pain.  Headache worsened by an upright position.  New onset weakness or numbness of an extremity below the injection site  Hives or difficulty breathing (go to the emergency room)  Inflammation or drainage at the infection site  Severe back/neck pain  Any new symptoms which are concerning to you  Please note:  Although the local anesthetic injected can often make your back or neck feel good for several hours after the injection, the pain will likely return.  It takes 3-7 days for steroids to work in the epidural space.  You may not notice any pain relief for at least that one week.    If effective, we will often do a series of three injections spaced 3-6 weeks apart to maximally decrease your pain.  After the initial series, we generally will wait several months before considering a repeat injection of the same type.  If you have any questions, please call [phone] [facility] Clinic[facility] RISKS AND  COMPLICATIONS  What are the risk, side effects and possible complications? Generally speaking, most procedures are safe.  However, with any procedure there are risks, side effects, and the possibility of complications.  The risks and complications are dependent upon the sites that are lesioned, or the type of nerve block to be performed.  The closer the procedure is to the spine, the more serious the risks are.  Great care is taken when placing the radio frequency needles, block needles or lesioning probes, but sometimes complications can occur. 1. Infection: Any time there is an injection through the skin, there is a risk of infection.  This is why sterile conditions are used for these blocks.  There are four possible types of infection. 1. Localized skin infection. 2. Central Nervous System Infection-This can be in the form of Meningitis, which can be deadly. 3. Epidural Infections-This can be in the form of an epidural abscess, which can cause pressure inside of the spine, causing compression of the spinal cord with subsequent paralysis. This would require an emergency surgery to decompress, and there are no guarantees that the patient would recover from the paralysis. 4. Discitis-This is an infection of the intervertebral discs.  It occurs in about 1% of discography procedures.  It is difficult to treat and it may lead to surgery.        2. Pain: the needles have to go through skin and soft tissues, will cause soreness.       3. Damage to internal structures:  The nerves to be lesioned may be near blood vessels or    other nerves which can be potentially damaged.       4. Bleeding: Bleeding is more common if the patient is taking blood thinners such as  aspirin, Coumadin, Ticiid, Plavix, etc., or if he/she have some genetic predisposition  such as hemophilia. Bleeding into the spinal canal can cause compression of the spinal  cord with subsequent paralysis.  This would require an emergency surgery  to  decompress and there are no guarantees that the patient would recover from the  paralysis.       5. Pneumothorax:  Puncturing of a lung is a possibility, every time a needle is introduced in  the area of the chest or upper back.  Pneumothorax refers to free air around the  collapsed lung(s), inside of the thoracic cavity (chest cavity).  Another two possible  complications related to a similar event would include: Hemothorax and Chylothorax.   These are variations of the Pneumothorax, where instead of air around the collapsed  lung(s), you may have blood or chyle, respectively.       6. Spinal headaches: They may occur with any procedures in the area of the spine.       7. Persistent CSF (Cerebro-Spinal Fluid) leakage: This is a rare problem, but may occur  with prolonged intrathecal or epidural catheters either due to the formation of a fistulous  track or a dural tear.       8. Nerve damage: By working so close to the spinal cord, there is always a possibility of  nerve damage, which could be   as serious as a permanent spinal cord injury with  paralysis.       9. Death:  Although rare, severe deadly allergic reactions known as "Anaphylactic  reaction" can occur to any of the medications used.      10. Worsening of the symptoms:  We can always make thing worse.  What are the chances of something like this happening? Chances of any of this occuring are extremely low.  By statistics, you have more of a chance of getting killed in a motor vehicle accident: while driving to the hospital than any of the above occurring .  Nevertheless, you should be aware that they are possibilities.  In general, it is similar to taking a shower.  Everybody knows that you can slip, hit your head and get killed.  Does that mean that you should not shower again?  Nevertheless always keep in mind that statistics do not mean anything if you happen to be on the wrong side of them.  Even if a procedure has a 1 (one) in a 1,000,000  (million) chance of going wrong, it you happen to be that one..Also, keep in mind that by statistics, you have more of a chance of having something go wrong when taking medications.  Who should not have this procedure? If you are on a blood thinning medication (e.g. Coumadin, Plavix, see list of "Blood Thinners"), or if you have an active infection going on, you should not have the procedure.  If you are taking any blood thinners, please inform your physician.  How should I prepare for this procedure?  Do not eat or drink anything at least six hours prior to the procedure.  Bring a driver with you .  It cannot be a taxi.  Come accompanied by an adult that can drive you back, and that is strong enough to help you if your legs get weak or numb from the local anesthetic.  Take all of your medicines the morning of the procedure with just enough water to swallow them.  If you have diabetes, make sure that you are scheduled to have your procedure done first thing in the morning, whenever possible.  If you have diabetes, take only half of your insulin dose and notify our nurse that you have done so as soon as you arrive at the clinic.  If you are diabetic, but only take blood sugar pills (oral hypoglycemic), then do not take them on the morning of your procedure.  You may take them after you have had the procedure.  Do not take aspirin or any aspirin-containing medications, at least eleven (11) days prior to the procedure.  They may prolong bleeding.  Wear loose fitting clothing that may be easy to take off and that you would not mind if it got stained with Betadine or blood.  Do not wear any jewelry or perfume  Remove any nail coloring.  It will interfere with some of our monitoring equipment.  NOTE: Remember that this is not meant to be interpreted as a complete list of all possible complications.  Unforeseen problems may occur.  BLOOD THINNERS The following drugs contain aspirin or other  products, which can cause increased bleeding during surgery and should not be taken for 2 weeks prior to and 1 week after surgery.  If you should need take something for relief of minor pain, you may take acetaminophen which is found in Tylenol,m Datril, Anacin-3 and Panadol. It is not blood thinner. The products listed below   are.  Do not take any of the products listed below in addition to any listed on your instruction sheet.  A.P.C or A.P.C with Codeine Codeine Phosphate Capsules #3 Ibuprofen Ridaura  ABC compound Congesprin Imuran rimadil  Advil Cope Indocin Robaxisal  Alka-Seltzer Effervescent Pain Reliever and Antacid Coricidin or Coricidin-D  Indomethacin Rufen  Alka-Seltzer plus Cold Medicine Cosprin Ketoprofen S-A-C Tablets  Anacin Analgesic Tablets or Capsules Coumadin Korlgesic Salflex  Anacin Extra Strength Analgesic tablets or capsules CP-2 Tablets Lanoril Salicylate  Anaprox Cuprimine Capsules Levenox Salocol  Anexsia-D Dalteparin Magan Salsalate  Anodynos Darvon compound Magnesium Salicylate Sine-off  Ansaid Dasin Capsules Magsal Sodium Salicylate  Anturane Depen Capsules Marnal Soma  APF Arthritis pain formula Dewitt's Pills Measurin Stanback  Argesic Dia-Gesic Meclofenamic Sulfinpyrazone  Arthritis Bayer Timed Release Aspirin Diclofenac Meclomen Sulindac  Arthritis pain formula Anacin Dicumarol Medipren Supac  Analgesic (Safety coated) Arthralgen Diffunasal Mefanamic Suprofen  Arthritis Strength Bufferin Dihydrocodeine Mepro Compound Suprol  Arthropan liquid Dopirydamole Methcarbomol with Aspirin Synalgos  ASA tablets/Enseals Disalcid Micrainin Tagament  Ascriptin Doan's Midol Talwin  Ascriptin A/D Dolene Mobidin Tanderil  Ascriptin Extra Strength Dolobid Moblgesic Ticlid  Ascriptin with Codeine Doloprin or Doloprin with Codeine Momentum Tolectin  Asperbuf Duoprin Mono-gesic Trendar  Aspergum Duradyne Motrin or Motrin IB Triminicin  Aspirin plain, buffered or enteric  coated Durasal Myochrisine Trigesic  Aspirin Suppositories Easprin Nalfon Trillsate  Aspirin with Codeine Ecotrin Regular or Extra Strength Naprosyn Uracel  Atromid-S Efficin Naproxen Ursinus  Auranofin Capsules Elmiron Neocylate Vanquish  Axotal Emagrin Norgesic Verin  Azathioprine Empirin or Empirin with Codeine Normiflo Vitamin E  Azolid Emprazil Nuprin Voltaren  Bayer Aspirin plain, buffered or children's or timed BC Tablets or powders Encaprin Orgaran Warfarin Sodium  Buff-a-Comp Enoxaparin Orudis Zorpin  Buff-a-Comp with Codeine Equegesic Os-Cal-Gesic   Buffaprin Excedrin plain, buffered or Extra Strength Oxalid   Bufferin Arthritis Strength Feldene Oxphenbutazone   Bufferin plain or Extra Strength Feldene Capsules Oxycodone with Aspirin   Bufferin with Codeine Fenoprofen Fenoprofen Pabalate or Pabalate-SF   Buffets II Flogesic Panagesic   Buffinol plain or Extra Strength Florinal or Florinal with Codeine Panwarfarin   Buf-Tabs Flurbiprofen Penicillamine   Butalbital Compound Four-way cold tablets Penicillin   Butazolidin Fragmin Pepto-Bismol   Carbenicillin Geminisyn Percodan   Carna Arthritis Reliever Geopen Persantine   Carprofen Gold's salt Persistin   Chloramphenicol Goody's Phenylbutazone   Chloromycetin Haltrain Piroxlcam   Clmetidine heparin Plaquenil   Cllnoril Hyco-pap Ponstel   Clofibrate Hydroxy chloroquine Propoxyphen         Before stopping any of these medications, be sure to consult the physician who ordered them.  Some, such as Coumadin (Warfarin) are ordered to prevent or treat serious conditions such as "deep thrombosis", "pumonary embolisms", and other heart problems.  The amount of time that you may need off of the medication may also vary with the medication and the reason for which you were taking it.  If you are taking any of these medications, please make sure you notify your pain physician before you undergo any procedures.          

## 2016-07-02 NOTE — Progress Notes (Signed)
Nursing Pain Medication Assessment:  Safety precautions to be maintained throughout the outpatient stay will include: orient to surroundings, keep bed in low position, maintain call bell within reach at all times, provide assistance with transfer out of bed and ambulation.  Medication Inspection Compliance: Pill count conducted under aseptic conditions, in front of the patient. Neither the pills nor the bottle was removed from the patient's sight at any time. Once count was completed pills were immediately returned to the patient in their original bottle.  Medication: Tramadol (Ultram) Pill/Patch Count: 26 of 90 pills remain Pill/Patch Appearance: Markings consistent with prescribed medication Bottle Appearance: Standard pharmacy container. Clearly labeled. Filled Date: 03/ / 30 / 2018 Last Medication intake:  Today

## 2016-07-13 DIAGNOSIS — H04123 Dry eye syndrome of bilateral lacrimal glands: Secondary | ICD-10-CM | POA: Diagnosis not present

## 2016-08-02 DIAGNOSIS — H2513 Age-related nuclear cataract, bilateral: Secondary | ICD-10-CM | POA: Diagnosis not present

## 2016-08-02 DIAGNOSIS — H1859 Other hereditary corneal dystrophies: Secondary | ICD-10-CM | POA: Diagnosis not present

## 2016-08-02 DIAGNOSIS — H524 Presbyopia: Secondary | ICD-10-CM | POA: Diagnosis not present

## 2016-08-03 DIAGNOSIS — I2510 Atherosclerotic heart disease of native coronary artery without angina pectoris: Secondary | ICD-10-CM | POA: Diagnosis not present

## 2016-08-03 DIAGNOSIS — I214 Non-ST elevation (NSTEMI) myocardial infarction: Secondary | ICD-10-CM | POA: Diagnosis not present

## 2016-08-03 DIAGNOSIS — I6523 Occlusion and stenosis of bilateral carotid arteries: Secondary | ICD-10-CM | POA: Diagnosis not present

## 2016-08-03 DIAGNOSIS — E782 Mixed hyperlipidemia: Secondary | ICD-10-CM | POA: Diagnosis not present

## 2016-08-03 DIAGNOSIS — I10 Essential (primary) hypertension: Secondary | ICD-10-CM | POA: Diagnosis not present

## 2016-08-03 DIAGNOSIS — R079 Chest pain, unspecified: Secondary | ICD-10-CM | POA: Diagnosis not present

## 2016-08-31 DIAGNOSIS — I2510 Atherosclerotic heart disease of native coronary artery without angina pectoris: Secondary | ICD-10-CM | POA: Diagnosis not present

## 2016-08-31 DIAGNOSIS — E782 Mixed hyperlipidemia: Secondary | ICD-10-CM | POA: Diagnosis not present

## 2016-08-31 DIAGNOSIS — R079 Chest pain, unspecified: Secondary | ICD-10-CM | POA: Diagnosis not present

## 2016-08-31 DIAGNOSIS — I10 Essential (primary) hypertension: Secondary | ICD-10-CM | POA: Diagnosis not present

## 2016-09-07 ENCOUNTER — Ambulatory Visit
Admission: RE | Admit: 2016-09-07 | Discharge: 2016-09-07 | Disposition: A | Discharge status: Home / Self Care | Payer: Medicare Other | Source: Ambulatory Visit | Attending: Anesthesiology | Admitting: Anesthesiology

## 2016-09-07 ENCOUNTER — Ambulatory Visit (HOSPITAL_BASED_OUTPATIENT_CLINIC_OR_DEPARTMENT_OTHER): Payer: Medicare Other | Admitting: Anesthesiology

## 2016-09-07 ENCOUNTER — Encounter: Payer: Self-pay | Admitting: Anesthesiology

## 2016-09-07 ENCOUNTER — Other Ambulatory Visit: Payer: Self-pay | Admitting: Anesthesiology

## 2016-09-07 VITALS — BP 134/84 | HR 87 | Temp 98.0°F | Resp 18 | Ht 68.0 in | Wt 180.0 lb

## 2016-09-07 DIAGNOSIS — M539 Dorsopathy, unspecified: Secondary | ICD-10-CM

## 2016-09-07 DIAGNOSIS — M5441 Lumbago with sciatica, right side: Secondary | ICD-10-CM | POA: Diagnosis not present

## 2016-09-07 DIAGNOSIS — M5136 Other intervertebral disc degeneration, lumbar region: Secondary | ICD-10-CM | POA: Diagnosis not present

## 2016-09-07 DIAGNOSIS — M5432 Sciatica, left side: Secondary | ICD-10-CM | POA: Insufficient documentation

## 2016-09-07 DIAGNOSIS — R52 Pain, unspecified: Secondary | ICD-10-CM

## 2016-09-07 DIAGNOSIS — M5387 Other specified dorsopathies, lumbosacral region: Secondary | ICD-10-CM

## 2016-09-07 MED ORDER — HYDROCODONE-ACETAMINOPHEN 5-325 MG PO TABS: 1.0000 | ORAL_TABLET | Freq: Two times a day (BID) | ORAL | 0 refills | Status: DC

## 2016-09-07 MED ORDER — DEXAMETHASONE SODIUM PHOSPHATE 10 MG/ML IJ SOLN
10.0000 mg | Freq: Once | INTRAMUSCULAR | Status: AC
  Administered 2016-09-07: 10 mg via INTRAMUSCULAR

## 2016-09-07 MED ORDER — ROPIVACAINE HCL 2 MG/ML IJ SOLN
10.0000 mL | Freq: Once | INTRAMUSCULAR | Status: AC
  Administered 2016-09-07: 10 mL via EPIDURAL

## 2016-09-07 MED ORDER — IOPAMIDOL (ISOVUE-M 200) INJECTION 41%: 20.0000 mL | Freq: Once | INTRAMUSCULAR | Status: DC | PRN

## 2016-09-07 MED ORDER — DIAZEPAM 5 MG PO TABS: ORAL_TABLET | ORAL | 1 refills | Status: DC

## 2016-09-07 MED ORDER — LIDOCAINE HCL (PF) 1 % IJ SOLN
INTRAMUSCULAR | Status: AC
  Filled 2016-09-07: qty 5

## 2016-09-07 MED ORDER — LIDOCAINE HCL (PF) 1 % IJ SOLN
5.0000 mL | Freq: Once | INTRAMUSCULAR | Status: AC
  Administered 2016-09-07: 5 mL via SUBCUTANEOUS

## 2016-09-07 MED ORDER — TRIAMCINOLONE ACETONIDE 40 MG/ML IJ SUSP
INTRAMUSCULAR | Status: AC
  Filled 2016-09-07: qty 1

## 2016-09-07 MED ORDER — TRIAMCINOLONE ACETONIDE 40 MG/ML IJ SUSP
40.0000 mg | Freq: Once | INTRAMUSCULAR | Status: AC
  Administered 2016-09-07: 40 mg

## 2016-09-07 MED ORDER — ROPIVACAINE HCL 2 MG/ML IJ SOLN
INTRAMUSCULAR | Status: AC
  Filled 2016-09-07: qty 10

## 2016-09-07 MED ORDER — DEXAMETHASONE SODIUM PHOSPHATE 10 MG/ML IJ SOLN
INTRAMUSCULAR | Status: AC
  Filled 2016-09-07: qty 1

## 2016-09-07 MED ORDER — TRAMADOL HCL 50 MG PO TABS: 50.0000 mg | ORAL_TABLET | Freq: Four times a day (QID) | ORAL | 5 refills | Status: DC | PRN

## 2016-09-07 MED ORDER — SODIUM CHLORIDE 0.9 % IJ SOLN
INTRAMUSCULAR | Status: AC
  Filled 2016-09-07: qty 10

## 2016-09-07 MED ORDER — SODIUM CHLORIDE 0.9% FLUSH
10.0000 mL | Freq: Once | INTRAVENOUS | Status: AC
  Administered 2016-09-07: 5 mL

## 2016-09-07 NOTE — Patient Instructions (Signed)
Epidural Steroid Injection Patient Information  Description: The epidural space surrounds the nerves as they exit the spinal cord.  In some patients, the nerves can be compressed and inflamed by a bulging disc or a tight spinal canal (spinal stenosis).  By injecting steroids into the epidural space, we can bring irritated nerves into direct contact with a potentially helpful medication.  These steroids act directly on the irritated nerves and can reduce swelling and inflammation which often leads to decreased pain.  Epidural steroids may be injected anywhere along the spine and from the neck to the low back depending upon the location of your pain.   After numbing the skin with local anesthetic (like Novocaine), a small needle is passed into the epidural space slowly.  You may experience a sensation of pressure while this is being done.  The entire block usually last less than 10 minutes.  Conditions which may be treated by epidural steroids:   Low back and leg pain  Neck and arm pain  Spinal stenosis  Post-laminectomy syndrome  Herpes zoster (shingles) pain  Pain from compression fractures  Preparation for the injection:  1. Do not eat any solid food or dairy products within 8 hours of your appointment.  2. You may drink clear liquids up to 3 hours before appointment.  Clear liquids include water, black coffee, juice or soda.  No milk or cream please. 3. You may take your regular medication, including pain medications, with a sip of water before your appointment  Diabetics should hold regular insulin (if taken separately) and take 1/2 normal NPH dos the morning of the procedure.  Carry some sugar containing items with you to your appointment. 4. A driver must accompany you and be prepared to drive you home after your procedure.  5. Bring all your current medications with your. 6. An IV may be inserted and sedation may be given at the discretion of the physician.   7. A blood pressure  cuff, EKG and other monitors will often be applied during the procedure.  Some patients may need to have extra oxygen administered for a short period. 8. You will be asked to provide medical information, including your allergies, prior to the procedure.  We must know immediately if you are taking blood thinners (like Coumadin/Warfarin)  Or if you are allergic to IV iodine contrast (dye). We must know if you could possible be pregnant.  Possible side-effects:  Bleeding from needle site  Infection (rare, may require surgery)  Nerve injury (rare)  Numbness & tingling (temporary)  Difficulty urinating (rare, temporary)  Spinal headache ( a headache worse with upright posture)  Light -headedness (temporary)  Pain at injection site (several days)  Decreased blood pressure (temporary)  Weakness in arm/leg (temporary)  Pressure sensation in back/neck (temporary)  Call if you experience:  Fever/chills associated with headache or increased back/neck pain.  Headache worsened by an upright position.  New onset weakness or numbness of an extremity below the injection site  Hives or difficulty breathing (go to the emergency room)  Inflammation or drainage at the infection site  Severe back/neck pain  Any new symptoms which are concerning to you  Please note:  Although the local anesthetic injected can often make your back or neck feel good for several hours after the injection, the pain will likely return.  It takes 3-7 days for steroids to work in the epidural space.  You may not notice any pain relief for at least that one week.    If effective, we will often do a series of three injections spaced 3-6 weeks apart to maximally decrease your pain.  After the initial series, we generally will wait several months before considering a repeat injection of the same type.  If you have any questions, please call [phone] [facility] Clinic[facility]  Discharge Instructions  General Discharge Instructions :  If you need to reach your doctor call: Monday-Friday 8:00 am - 4:00 pm at [phone] or toll free [phone].  After clinic hours [phone] to have operator reach doctor.  Bring all of your medication bottles to all your appointments in the pain clinic.  To cancel or reschedule your appointment with Pain Management please remember to call 24 hours in advance to avoid a fee.  Refer to the educational materials which you have been given on: General Risks, I had my Procedure. Discharge Instructions, Post Sedation.  Post Procedure Instructions:  The drugs you were given will stay in your system until tomorrow, so for the next 24 hours you should not drive, make any legal decisions or drink any alcoholic beverages.  You may eat anything you prefer, but it is better to start with liquids then soups and crackers, and gradually work up to solid foods.  Please notify your doctor immediately if you have any unusual bleeding, trouble breathing or pain that is not related to your normal pain.  Depending on the type of procedure that was done, some parts of your body may feel week and/or numb.  This usually clears up by tonight or the next day.  Walk with the use of an assistive device or accompanied by an adult for the 24 hours.  You may use ice on the affected area for the first 24 hours.  Put ice in a Ziploc bag and cover with a towel and place against area 15 minutes on 15 minutes off.  You may switch to heat after 24 hours. 

## 2016-09-07 NOTE — Progress Notes (Signed)
Subjective:  Patient ID: Carrie Myers, female    DOB: November 06, 1945  Age: 71 y.o. MRN: [id number]  CC: Fibromyalgia and Back Pain (arthritis)   Service Provided on Last Visit: Med Refill  PROCEDURE:L5-S1 epidural steroid under fluoroscopic guidance without sedation and right lumbar trigger point injection  HPI Carrie Myers presents for reevaluation last seen in April. She's been having more back spasming primarily in the right side with some left posterior and lateral leg pain. Otherwise she is been stable in regards to her symptom complex taking her Vicodin Valium and tramadol as prescribed without difficulty. Based on her narcotic assessment sheet she seems to be doing well with good improvement with the medications. No change in bowel or bladder function is noted.   By history she presents as a previous patient of Dr. Belenda Cruise crisp. She desires to keep her pain management here in town and has asked to be referred to me for evaluation. She has a long-standing history of low back pain and diffuse systemic body pain for 17 years and is on medication management for that. She describes pain in the low back which is her primary pain and diffuse shoulder posterior shoulder hip buttock and lower extremity pain consistent with fibromyalgia as well. Her low back pain radiates into the left lower extremity and she has had previous epidural steroid injections for that which has effectively relieved her sciatica. She has some of the symptoms now but not severe and currently manageable. She also has intermittent neck and shoulder pain and has had previous injection therapy for that without significant relief. She describes pain that's active throughout the day and aggravated by bending kneeling lifting motion and standing. Twisting also aggravates the pain and alleviating factors include rest and medication management and epidural steroids for her back pain. The pain is agonizing generally consistent  throughout the day and only managed well with her medication management and occasionally with some of her exercises. She has associated intermittent weakness to the lower extremities as well. As mentioned she has had previous epidural steroid injections and a previous MRI with no significant changes in her symptoms since her MRI.  History Carrie Myers has a past medical history of Allergy; Anxiety; Arthritis; Bilateral dry eyes; Dental crowns present; Fibromyalgia; GERD (gastroesophageal reflux disease); Hyperlipidemia; Hypertension; Kidney stones; Myocardial infarction ([place]) (04/22/2006); Osteoarthritis; Rheumatoid arthritis ([place]); and Thyroid disease.   She has a past surgical history that includes Appendectomy; Cesarean section; Abdominal hysterectomy; Bunionectomy (Bilateral); dental implant; Colonoscopy (2010); Coronary angioplasty with stent (04/2006); Shoulder arthroscopy (Right, 05/07/2015); Breast biopsy (Right); and Breast cyst aspiration (Left).   Her family history includes Diabetes in her sister; Heart disease in her father; Pancreatic cancer in her sister; Stroke in her mother.She reports that she has quit smoking. Her smoking use included Cigarettes. She quit smokeless tobacco use about 10 years ago. She reports that she does not drink alcohol or use drugs.  Results for orders placed in visit on 11/11/09  MR [place] W[facility] C[facility]   Narrative * PRIOR REPORT IMPORTED FROM AN EXTERNAL SYSTEM *   PRIOR REPORT IMPORTED FROM THE [facility] WORKFLOW SYSTEM   REASON FOR EXAM:    sciatica pain  COMMENTS:   PROCEDURE:     MMR - MMR LUMBAR SPINE WO CONTRAST  - Nov 11 2009  9:50AM   RESULT:     MRI LUMBAR SPINE WITHOUT CONTRAST   HISTORY: Sciatic pain   COMPARISON: None   TECHNIQUE: Multiplanar and multisequence MRI  of the lumbar spine were  obtained, without administration of IV contrast.   FINDINGS:   The vertebral body heights are maintained. There is grade 1  anterolisthesis  of L4 on L5. There  is normal bone marrow signal demonstrated throughout  the  vertebra. The intervertebral disc spaces are well-maintained.   The spinal cord is of normal volume and contour. The cord terminates  normally at L1 . The nerve roots of the cauda equina and the filum  terminale  have the usual appearance.   The visualized portions of the SI joints are unremarkable.   The imaged intra-abdominal contents are unremarkable.   T11-T12: There is a small left paracentral disc protrusion.   T12-L1: No significant disc bulge. No evidence of neural foraminal or  central stenosis.   L1-L2: No significant disc bulge. No evidence of neural foraminal or  central  stenosis.   L2-L3: No significant disc bulge. No evidence of neural foraminal or  central  stenosis.   L3-L4: Mild broad-based disc bulge with an annular tear. No evidence of  neural foraminal or central stenosis.   L4-L5: Mild broad-based disc bulge. Severe bilateral facet arthropathy  with  ligamentum flavum infolding resulting in moderate spinal stenosis. There  is  moderate left foraminal stenosis.   L5-S1: A tiny central disc protrusion without significant impression on  the  ventral thecal sac. No evidence of neural foraminal or central stenosis.   IMPRESSION:   1. Lumbar spine spondylosis as described above.        [employer]ox[employer] Select 13  Date Value Ref Range Status  05/12/2016 FINAL  Final    Comment:    ==================================================================== TOX[employer] SELECT 13 (MW) ==================================================================== Test                             Result       Flag       Units Drug Present and Declared for Prescription Verification   Desmethyldiazepam              220          EXPECTED   ng/mg creat   Oxazepam                       417          EXPECTED   ng/mg creat   Temazepam                      286          EXPECTED   ng/mg creat    Desmethyldiazepam, oxazepam,  and temazepam are expected    metabolites of diazepam. Desmethyldiazepam and oxazepam are also    expected metabolites of other drugs, including chlordiazepoxide,    prazepam, clorazepate, and halazepam. Oxazepam is an expected    metabolite of temazepam. Oxazepam and temazepam are also    available as scheduled prescription medications.   Tramadol                       PRESENT      EXPECTED   O-Desmethyltramadol            PRESENT      EXPECTED   N-Desmethyltramadol            PRESENT      EXPECTED    Source of tramadol is a prescription medication.  O-desmethyltramadol and N-desmethyltramadol are expected    metabolites of tramadol. Drug Absent but Declared for Prescription Verification   Hydrocodone                    Not Detected UNEXPECTED ng/mg creat ==================================================================== Test                      Result    Flag   Units      Ref Range   Creatinine              35               mg/dL      >=20 ==================================================================== Declared Medications:  The flagging and interpretation on this report are based on the  following declared medications.  Unexpected results may arise from  inaccuracies in the declared medications.  **Note: The testing scope of this panel includes these medications:  Diazepam (Valium)  Hydrocodone (Hycodan)  Hydrocodone (Hydrocodone-Acetaminophen)  Tramadol  **Note: The testing scope of this panel does not include following  reported medications:  Acetaminophen (Hydrocodone-Acetaminophen)  Cefdinir  Citalopram (Celexa)  Diclofenac  Homatropine (Hycodan)  Levothyroxine  Losartan (Cozaar)  Omeprazole (Nexium)  Oseltamivir (Tamiflu)  Pravastatin  Prednisone (Deltasone) ==================================================================== For clinical consultation, please call [phone]. ====================================================================      Outpatient Medications Prior to Visit  Medication Sig Dispense Refill  . citalopram (CELEXA) 20 MG tablet TAKE 1 TABLET BY MOUTH  DAILY 90 tablet 3  . Diclofenac-Misoprostol 75-0.2 MG TBEC TAKE 1 TABLET BY MOUTH TWO  TIMES DAILY 180 tablet 3  . esomeprazole (NEXIUM) 20 MG capsule Take 40 mg by mouth daily. Over the Counter    . levothyroxine (SYNTHROID, LEVOTHROID) 88 MCG tablet TAKE 1 TABLET BY MOUTH  DAILY 90 tablet 3  . losartan (COZAAR) 50 MG tablet Take 50 mg by mouth daily.    . pravastatin (PRAVACHOL) 80 MG tablet Take 80 mg by mouth daily.    . diazepam (VALIUM) 5 MG tablet Limit  1/2  tablet by mouth per day or every other day if tolerated 15 tablet 1  . HYDROcodone-acetaminophen (NORCO/VICODIN) 5-325 MG tablet Take 1 tablet by mouth 2 (two) times daily at 10 AM and 5 PM. Limit 1-2 tablets by mouth per day if tolerated 30 tablet 0  . traMADol (ULTRAM) 50 MG tablet TAKE 1 TABLET BY MOUTH UP TO THREE TIMES DAILY AS NEEDED FOR BREAKTHROUGH PAIN WHILE TAKING HYDROCODONE 90 tablet 0  . cefdinir (OMNICEF) 300 MG capsule Take 1 capsule (300 mg total) by mouth 2 (two) times daily. (Patient not taking: Reported on 04/14/2016) 20 capsule 0  . HYDROcodone-homatropine (HYCODAN) 5-1.5 MG/5ML syrup Take 5 mLs by mouth every 6 (six) hours as needed for cough. (Patient not taking: Reported on 09/07/2016) 180 mL 0  . oseltamivir (TAMIFLU) 75 MG capsule Take 1 capsule (75 mg total) by mouth 2 (two) times daily. (Patient not taking: Reported on 07/02/2016) 10 capsule 0  . predniSONE (DELTASONE) 10 MG tablet Take 6 on day 1, 5 on day 2, 4 on day 3, 3 on day 4, 2 on day 5 and 1 on day 1 then stop. (Patient not taking: Reported on 09/07/2016) 21 tablet 0   No facility-administered medications prior to visit.    Lab Results  Component Value Date   WBC 5.8 03/16/2016   HGB 13.0 03/16/2016   HCT 39.4 03/16/2016   PLT 215 03/16/2016   GLUCOSE 92  03/16/2016   CHOL 119 03/16/2016   TRIG 154 (H) 03/16/2016    HDL 41 03/16/2016   LDLCALC 47 03/16/2016   ALT 30 03/16/2016   AST 27 03/16/2016   NA 141 03/16/2016   K 4.6 03/16/2016   CL 99 03/16/2016   CREATININE 0.86 03/16/2016   BUN 15 03/16/2016   CO2 27 03/16/2016   TSH 6.140 (H) 03/16/2016    --------------------------------------------------------------------------------------------------------------------- No results found.     ---------------------------------------------------------------------------------------------------------------------- Past Medical History:  Diagnosis Date  . Allergy   . Anxiety   . Arthritis    neck, hands, lower back  . Bilateral dry eyes   . Dental crowns present    Implants  . Fibromyalgia   . GERD (gastroesophageal reflux disease)   . Hyperlipidemia   . Hypertension   . Kidney stones   . Myocardial infarction ([place]) 04/22/2006  . Osteoarthritis   . Rheumatoid arthritis ([place])   . Thyroid disease     Past Surgical History:  Procedure Laterality Date  . ABDOMINAL HYSTERECTOMY     total  . APPENDECTOMY    . BREAST BIOPSY Right    neg  . BREAST CYST ASPIRATION Left    neg  . BUNIONECTOMY Bilateral   . CESAREAN SECTION    . COLONOSCOPY  2010   normal  . CORONARY ANGIOPLASTY WITH STENT PLACEMENT  04/2006  . dental implant     [age]  . SHOULDER ARTHROSCOPY Right 05/07/2015   Procedure: Extensive arthroscopic debridement and arthroscopic subacromial decompression, right shoulder.  2. Steroid injection right thumb CMC joint.;  Surgeon: Corky Mull, MD;  Location: [facility];  Service: Orthopedics;  Laterality: Right;    Family History  Problem Relation Age of Onset  . Stroke Mother   . Heart disease Father   . Pancreatic cancer Sister   . Diabetes Sister   . Breast cancer Neg Hx     Social History  Substance Use Topics  . Smoking status: Former Smoker    Types: Cigarettes  . Smokeless tobacco: Former Systems developer    Quit date: 04/22/2006  . Alcohol use  No    ---------------------------------------------------------------------------------------------------------------------- Social History   Social History  . Marital status: Widowed    Spouse name: N/A  . Number of children: N/A  . Years of education: N/A   Social History Main Topics  . Smoking status: Former Smoker    Types: Cigarettes  . Smokeless tobacco: Former Systems developer    Quit date: 04/22/2006  . Alcohol use No  . Drug use: No  . Sexual activity: Not Asked   Other Topics Concern  . None   Social History Narrative  . None    BP 134/84   Pulse 87   Temp 98 F (36.7 C) (Oral)   Resp 18   Ht 5' 8"  (1.727 m)   Wt 180 lb (81.6 kg)   SpO2 97%   BMI 27.37 kg/m    BP Readings from Last 3 Encounters:  09/07/16 134/84  07/02/16 139/73  06/09/16 120/78     Wt Readings from Last 3 Encounters:  09/07/16 180 lb (81.6 kg)  07/02/16 180 lb (81.6 kg)  06/09/16 180 lb (81.6 kg)     ----------------------------------------------------------------------------------------------------------------------  ROS Review of Systems  Cardiac: High blood pressure previous heart attack in 2008 and previous stent. GI: Minimal constipation  Objective:  BP 134/84   Pulse 87   Temp 98 F (36.7 C) (Oral)   Resp 18   Ht  5' 8"  (1.727 m)   Wt 180 lb (81.6 kg)   SpO2 97%   BMI 27.37 kg/m   Physical Exam Patient is alert oriented cooperative compliant and a good historian.  Her heart is regular rate and rhythm without murmur She ambulates with a minimally antalgic gait but her strength appears to be well-preserved and she does have a trigger point in the right lumbar spinous musculature.   Assessment & Plan:   Carrie Myers was seen today for fibromyalgia and back pain.  Diagnoses and all orders for this visit:  DDD (degenerative disc disease), lumbar -     Lumbar Epidural Injection  Sciatica of left side -     triamcinolone acetonide (KENALOG-40) injection 40 mg; 1 mL (40 mg  total) by Other route once. -     sodium chloride flush (NS) 0.9 % injection 10 mL; 10 mLs by Other route once. -     ropivacaine (PF) 2 mg/mL (0.2%) (NAROPIN) injection 10 mL; 10 mLs by Epidural route once. -     lidocaine (PF) (XYLOCAINE) 1 % injection 5 mL; Inject 5 mLs into the skin once. -     iopamidol (ISOVUE-M) 41 % intrathecal injection 20 mL; 20 mLs by Other route once as needed for contrast. -     Lumbar Epidural Injection; Future  Acute right-sided low back pain with right-sided sciatica -     dexamethasone (DECADRON) injection 10 mg; Inject 1 mL (10 mg total) into the muscle once. -     Lumbar Epidural Injection; Future  Sciatica associated with disorder of lumbosacral spine -     Lumbar Epidural Injection  Other orders -     Discontinue: HYDROcodone-acetaminophen (NORCO/VICODIN) 5-325 MG tablet; Take 1 tablet by mouth 2 (two) times daily at 10 AM and 5 PM. Limit 1-2 tablets by mouth per day if tolerated -     HYDROcodone-acetaminophen (NORCO/VICODIN) 5-325 MG tablet; Take 1 tablet by mouth 2 (two) times daily at 10 AM and 5 PM. Limit 1-2 tablets by mouth per day if tolerated -     traMADol (ULTRAM) 50 MG tablet; Take 1 tablet (50 mg total) by mouth every 6 (six) hours as needed. -     diazepam (VALIUM) 5 MG tablet; Limit  1/2  tablet by mouth per day or every other day if tolerated     ----------------------------------------------------------------------------------------------------------------------  Problem List Items Addressed This Visit      Musculoskeletal and Integument   DDD (degenerative disc disease), lumbar - Primary   Relevant Medications   triamcinolone acetonide (KENALOG-40) injection 40 mg (Completed)   dexamethasone (DECADRON) injection 10 mg (Completed)   HYDROcodone-acetaminophen (NORCO/VICODIN) 5-325 MG tablet   traMADol (ULTRAM) 50 MG tablet    Other Visit Diagnoses    Sciatica of left side       Relevant Medications   triamcinolone acetonide  (KENALOG-40) injection 40 mg (Completed)   sodium chloride flush (NS) 0.9 % injection 10 mL (Completed)   ropivacaine (PF) 2 mg/mL (0.2%) (NAROPIN) injection 10 mL (Completed)   lidocaine (PF) (XYLOCAINE) 1 % injection 5 mL (Completed)   iopamidol (ISOVUE-M) 41 % intrathecal injection 20 mL   diazepam (VALIUM) 5 MG tablet   Other Relevant Orders   Lumbar Epidural Injection   Acute right-sided low back pain with right-sided sciatica       Relevant Medications   triamcinolone acetonide (KENALOG-40) injection 40 mg (Completed)   dexamethasone (DECADRON) injection 10 mg (Completed)   HYDROcodone-acetaminophen (NORCO/VICODIN) 5-325 MG  tablet   traMADol (ULTRAM) 50 MG tablet   Other Relevant Orders   Lumbar Epidural Injection   Sciatica associated with disorder of lumbosacral spine       Relevant Medications   diazepam (VALIUM) 5 MG tablet      ----------------------------------------------------------------------------------------------------------------------  1. Fibromyalgia Refills were given for her Ultram. Also her Valium was refilled for some of the muscle spasms and she's taking this sparingly. 2. DDD (degenerative disc disease), lumbar we will proceed with an L5-S1 lumbar epidural steroid secondary to the left posterior lateral radicular leg pain. She is to return to clinic in 2 months for reevaluation possible repeat injection at that time. Furthermore we will refill her Vicodin for sparing use for next 2 months. We have reviewed her narcotic assessment sheet and her [facility] practitioner database information and it is appropriate.  3. Sacroiliac joint disease   4. Primary osteoarthritis of both shoulders  5. Primary osteoarthritis of both hands   6. Facet syndrome, lumbar   7. Cervical facet joint syndrome  8.  Restless leg syndrome. I'm going to prescribe Valium 5 mg tablets one half daily at bedtime to assist. She is to return to clinic in 1-2 months for  reevaluation.     ----------------------------------------------------------------------------------------------------------------------  I have changed Carrie Myers's traMADol. I am also having her maintain her esomeprazole, losartan, pravastatin, citalopram, Diclofenac-Misoprostol, oseltamivir, HYDROcodone-homatropine, predniSONE, levothyroxine, cefdinir, HYDROcodone-acetaminophen, and diazepam. We administered triamcinolone acetonide, sodium chloride flush, ropivacaine (PF) 2 mg/mL (0.2%), lidocaine (PF), and dexamethasone.   Meds ordered this encounter  Medications  . triamcinolone acetonide (KENALOG-40) injection 40 mg  . sodium chloride flush (NS) 0.9 % injection 10 mL  . ropivacaine (PF) 2 mg/mL (0.2%) (NAROPIN) injection 10 mL  . lidocaine (PF) (XYLOCAINE) 1 % injection 5 mL  . iopamidol (ISOVUE-M) 41 % intrathecal injection 20 mL  . dexamethasone (DECADRON) injection 10 mg  . DISCONTD: HYDROcodone-acetaminophen (NORCO/VICODIN) 5-325 MG tablet    Sig: Take 1 tablet by mouth 2 (two) times daily at 10 AM and 5 PM. Limit 1-2 tablets by mouth per day if tolerated    Dispense:  30 tablet    Refill:  0    Do not fill until 41660630  . HYDROcodone-acetaminophen (NORCO/VICODIN) 5-325 MG tablet    Sig: Take 1 tablet by mouth 2 (two) times daily at 10 AM and 5 PM. Limit 1-2 tablets by mouth per day if tolerated    Dispense:  30 tablet    Refill:  0    Do not fill until [id number]  . traMADol (ULTRAM) 50 MG tablet    Sig: Take 1 tablet (50 mg total) by mouth every 6 (six) hours as needed.    Dispense:  90 tablet    Refill:  5  . diazepam (VALIUM) 5 MG tablet    Sig: Limit  1/2  tablet by mouth per day or every other day if tolerated    Dispense:  15 tablet    Refill:  1    Procedure: L5-S1 epidural steroid injection under fluoroscopic guidance without sedation   Procedure: L5-S1 LESI with fluoroscopic guidance   NOTE: The risks, benefits, and expectations of the procedure have  been discussed and explained to the patient who was understanding and in agreement with suggested treatment plan. No guarantees were made.  DESCRIPTION OF PROCEDURE: Lumbar epidural steroid injection with no Versed, EKG, blood pressure, pulse, and pulse oximetry monitoring. The procedure was performed with the patient in the prone position under  fluoroscopic guidance. I injected subcutaneous lidocaine overlying the L5-S1 site after its fluoroscopic identifictation.  Using strict aseptic technique, I then advanced an 18-gauge Tuohy epidural needle in the midline using interlaminar approach via loss-of-resistance to saline technique. There was negative aspiration for heme or  CSF.  I then confirmed position with both AP and Lateral fluoroscan. 0 cc of Isovue were injected and a  total of 5 mL of Preservative-Free normal saline mixed with 40 mg of Kenalog and 1cc Ropicaine 0.2 percent were injected incrementally via the  epidurally placed needle. The needle was removed. The patient tolerated the injection well and was convalesced and discharged to home in stable condition. Should the patient have any post procedure difficulty they have been instructed on how to contact us for assistance.   Right lumbar trigger point injection  Trigger point injection: The area overlying the aforementioned trigger points were prepped with alcohol. They were then injected with a 25-gauge needle with 9 cc of ropivacaine 0.2% and Decadron10 mg at each site after negative aspiration for heme. This was performed after informed consent was obtained and risks and benefits reviewed. She tolerated this procedure without difficulty and was convalesced and discharged to home in stable condition for follow-up as mentioned.  @Abisai Coble  Andree Elk, MD@    Follow-up: Return for evaluation, procedure.    Molli Barrows, MD   [facility] practitioner database for opioids was reviewed today and her findings were appropriate.  This  dictation was performed utilizing Systems analyst.  Please excuse any unintentional or mistaken typographical errors as a result of its unedited utilization.

## 2016-09-07 NOTE — Progress Notes (Deleted)
Safety precautions to be maintained throughout the outpatient stay will include: orient to surroundings, keep bed in low position, maintain call bell within reach at all times, provide assistance with transfer out of bed and ambulation.  

## 2016-09-07 NOTE — Progress Notes (Signed)
Nursing Pain Medication Assessment:  Safety precautions to be maintained throughout the outpatient stay will include: orient to surroundings, keep bed in low position, maintain call bell within reach at all times, provide assistance with transfer out of bed and ambulation.  Medication Inspection Compliance: Pill count conducted under aseptic conditions, in front of the patient. Neither the pills nor the bottle was removed from the patient's sight at any time. Once count was completed pills were immediately returned to the patient in their original bottle.  Medication #1: Hydrocodone/APAP Pill/Patch Count: 12 of 30 pills remain Pill/Patch Appearance: Markings consistent with prescribed medication Bottle Appearance: Standard pharmacy container. Clearly labeled. Filled Date: 06 / 07 / 2018 Last Medication intake:  Yesterday  Medication #2: Tramadol (Ultram) Pill/Patch Count: 36 of 90 pills remain Pill/Patch Appearance: Markings consistent with prescribed medication Bottle Appearance: Standard pharmacy container. Clearly labeled. Filled Date: 06 / 07 / 2018 Last Medication intake:  Today

## 2016-09-08 ENCOUNTER — Telehealth: Payer: Self-pay | Admitting: *Deleted

## 2016-09-08 NOTE — Telephone Encounter (Signed)
Denies any needs at this time. Instructed to call  If needed.

## 2016-09-08 NOTE — Telephone Encounter (Signed)
Spoke with patient re; procedure on yesterday states she didn't sleep well last evening because she was hurting and has to take more pain medicine than prescribed.  Told Shylie this was okay as long as she compensates for this the rest of the month.  Patient denies any bleeding, fever but does have slight headache.  States she feels that this is from applying ointment to legs and getting it around her nose and the odor from this was very strong.  Encouraged patient to eat and drink plenty of fluids today and to get some rest and see how the day goes.  She will call if she doesn't feel she is improving.

## 2016-09-11 ENCOUNTER — Encounter: Payer: Self-pay | Admitting: Emergency Medicine

## 2016-09-11 ENCOUNTER — Ambulatory Visit
Admission: EM | Admit: 2016-09-11 | Discharge: 2016-09-11 | Disposition: A | Discharge status: Home / Self Care | Payer: Medicare Other | Attending: Family Medicine | Admitting: Family Medicine

## 2016-09-11 DIAGNOSIS — J0100 Acute maxillary sinusitis, unspecified: Secondary | ICD-10-CM

## 2016-09-11 MED ORDER — LEVOFLOXACIN 500 MG PO TABS: 500.0000 mg | ORAL_TABLET | Freq: Every day | ORAL | 0 refills | Status: DC

## 2016-09-11 NOTE — ED Provider Notes (Signed)
[facility]    CSN: [id number] Arrival date & time: 09/11/16  1143     History   Chief Complaint Chief Complaint  Patient presents with  . sinus problem    HPI Carrie Myers is a 71 y.o. female.   The history is provided by the patient.  URI  Presenting symptoms: congestion, cough, ear pain, facial pain and rhinorrhea   Presenting symptoms: no fatigue, no fever and no sore throat   Congestion:    Location:  Nasal   Interferes with sleep: yes   Cough:    Cough characteristics:  Non-productive   Severity:  Mild Ear pain:    Location:  Right   Severity:  Mild   Chronicity:  New Rhinorrhea:    Quality:  Clear Severity:  Mild Onset quality:  Gradual Timing:  Intermittent Progression:  Waxing and waning Relieved by:  Nothing Associated symptoms: headaches and sinus pain   Associated symptoms: no arthralgias, no myalgias, no sneezing and no wheezing   Risk factors: being elderly     Past Medical History:  Diagnosis Date  . Allergy   . Anxiety   . Arthritis    neck, hands, lower back  . Bilateral dry eyes   . Dental crowns present    Implants  . Fibromyalgia   . GERD (gastroesophageal reflux disease)   . Hyperlipidemia   . Hypertension   . Kidney stones   . Myocardial infarction ([place]) 04/22/2006  . Osteoarthritis   . Rheumatoid arthritis ([place])   . Thyroid disease     Patient Active Problem List   Diagnosis Date Noted  . Status post shoulder surgery 05/27/2015  . Impingement syndrome of right shoulder 04/17/2015  . Other shoulder lesions, right shoulder 04/17/2015  . Fibrocystic breast 01/18/2015  . Gastroesophageal reflux disease without esophagitis 01/18/2015  . Hypothyroidism, postablative 01/18/2015  . Fracture of right foot 01/14/2015  . Arteriosclerosis of coronary artery 12/29/2014  . DJD of shoulder 11/13/2014  . Fibromyalgia 10/02/2014  . Intercostal neuralgia 10/02/2014  . DDD (degenerative disc disease), lumbar 10/02/2014    . Facet syndrome, lumbar ([place]) 10/02/2014  . Sacroiliac joint disease 10/02/2014  . Carotid artery narrowing 07/09/2014  . Benign essential HTN 07/01/2014  . Mixed hyperlipidemia 09/22/2012  . Arthritis of hand, degenerative 09/22/2012    Past Surgical History:  Procedure Laterality Date  . ABDOMINAL HYSTERECTOMY     total  . APPENDECTOMY    . BREAST BIOPSY Right    neg  . BREAST CYST ASPIRATION Left    neg  . BUNIONECTOMY Bilateral   . CESAREAN SECTION    . COLONOSCOPY  2010   normal  . CORONARY ANGIOPLASTY WITH STENT PLACEMENT  04/2006  . dental implant     [age]  . SHOULDER ARTHROSCOPY Right 05/07/2015   Procedure: Extensive arthroscopic debridement and arthroscopic subacromial decompression, right shoulder.  2. Steroid injection right thumb CMC joint.;  Surgeon: Corky Mull, MD;  Location: [place];  Service: Orthopedics;  Laterality: Right;    OB History    No data available       Home Medications    Prior to Admission medications   Medication Sig Start Date End Date Taking? Authorizing Provider  citalopram (CELEXA) 20 MG tablet TAKE 1 TABLET BY MOUTH  DAILY 01/18/16   [name], MD  diazepam (VALIUM) 5 MG tablet Limit  1/2  tablet by mouth per day or every other day if tolerated 09/07/16  Molli Barrows, MD  Diclofenac-Misoprostol 75-0.2 MG TBEC TAKE 1 TABLET BY MOUTH TWO  TIMES DAILY 01/18/16   [name], MD  esomeprazole (NEXIUM) 20 MG capsule Take 40 mg by mouth daily. Over the Counter    [provider]  HYDROcodone-acetaminophen (NORCO/VICODIN) 5-325 MG tablet Take 1 tablet by mouth 2 (two) times daily at 10 AM and 5 PM. Limit 1-2 tablets by mouth per day if tolerated 09/07/16   Molli Barrows, MD  levofloxacin (LEVAQUIN) 500 MG tablet Take 1 tablet (500 mg total) by mouth daily. 09/11/16   Juline Patch, MD  levothyroxine (SYNTHROID, LEVOTHROID) 88 MCG tablet TAKE 1 TABLET BY MOUTH  DAILY 03/26/16    [name], MD  losartan (COZAAR) 50 MG tablet Take 50 mg by mouth daily.    [provider]  pravastatin (PRAVACHOL) 80 MG tablet Take 80 mg by mouth daily.    [provider]  traMADol (ULTRAM) 50 MG tablet Take 1 tablet (50 mg total) by mouth every 6 (six) hours as needed. 09/07/16   Molli Barrows, MD    Family History Family History  Problem Relation Age of Onset  . Stroke Mother   . Heart disease Father   . Pancreatic cancer Sister   . Diabetes Sister   . Breast cancer Neg Hx     Social History Social History  Substance Use Topics  . Smoking status: Former Smoker    Types: Cigarettes  . Smokeless tobacco: Former Systems developer    Quit date: 04/22/2006  . Alcohol use No     Allergies   Augmentin [amoxicillin-pot clavulanate]; Codeine; Sulfa antibiotics; and Shellfish allergy   Review of Systems Review of Systems  Constitutional: Negative.  Negative for chills, diaphoresis, fatigue, fever and unexpected weight change.  HENT: Positive for congestion, ear pain, postnasal drip, rhinorrhea and sinus pain. Negative for ear discharge, sinus pressure, sneezing and sore throat.   Eyes: Negative for photophobia, pain, discharge, redness and itching.  Respiratory: Positive for cough. Negative for shortness of breath, wheezing and stridor.   Gastrointestinal: Negative for abdominal pain, blood in stool, constipation, diarrhea, nausea and vomiting.  Endocrine: Negative for cold intolerance, heat intolerance, polydipsia, polyphagia and polyuria.  Genitourinary: Negative for dysuria, flank pain, frequency, hematuria, menstrual problem, pelvic pain, urgency, vaginal bleeding and vaginal discharge.  Musculoskeletal: Negative for arthralgias, back pain and myalgias.  Skin: Negative for rash.  Allergic/Immunologic: Negative for environmental allergies and food allergies.  Neurological: Positive for headaches. Negative for dizziness, weakness, light-headedness and numbness.    Hematological: Negative for adenopathy. Does not bruise/bleed easily.  Psychiatric/Behavioral: Negative for dysphoric mood. The patient is not nervous/anxious.      Physical Exam Triage Vital Signs ED Triage Vitals  Enc Vitals Group     BP 09/11/16 1208 (!) 148/89     Pulse Rate 09/11/16 1208 85     Resp 09/11/16 1208 16     Temp 09/11/16 1208 98.5 F (36.9 C)     Temp Source 09/11/16 1208 Oral     SpO2 09/11/16 1208 97 %     Weight 09/11/16 1206 180 lb (81.6 kg)     Height 09/11/16 1206 5\' 8"  (1.727 m)     Head Circumference --      Peak Flow --      Pain Score 09/11/16 1206 5     Pain Loc --      Pain Edu? --      Excl. in  GC? --    No data found.   Updated Vital Signs BP (!) 148/89 (BP Location: Left Arm)   Pulse 85   Temp 98.5 F (36.9 C) (Oral)   Resp 16   Ht 5\' 8"  (1.727 m)   Wt 180 lb (81.6 kg)   SpO2 97%   BMI 27.37 kg/m   Visual Acuity Right Eye Distance:   Left Eye Distance:   Bilateral Distance:    Right Eye Near:   Left Eye Near:    Bilateral Near:     Physical Exam  Constitutional: No distress.  HENT:  Head: Normocephalic and atraumatic.  Right Ear: Tympanic membrane, external ear and ear canal normal.  Left Ear: Tympanic membrane, external ear and ear canal normal.  Nose: Mucosal edema present. Right sinus exhibits maxillary sinus tenderness. Left sinus exhibits maxillary sinus tenderness.  Mouth/Throat: Oropharynx is clear and moist. No oropharyngeal exudate, posterior oropharyngeal edema or posterior oropharyngeal erythema.  Eyes: Conjunctivae and EOM are normal. Pupils are equal, round, and reactive to light. Right eye exhibits no discharge. Left eye exhibits no discharge.  Neck: Normal range of motion. Neck supple. No JVD present. No thyromegaly present.  Cardiovascular: Normal rate, regular rhythm, normal heart sounds and intact distal pulses.  Exam reveals no gallop and no friction rub.   No murmur heard. Pulmonary/Chest: Effort  normal and breath sounds normal. She has no wheezes. She has no rales.  Abdominal: Soft. Bowel sounds are normal. She exhibits no mass. There is no tenderness. There is no guarding.  Musculoskeletal: Normal range of motion. She exhibits no edema.  Lymphadenopathy:    She has no cervical adenopathy.  Neurological: She is alert. She has normal reflexes.  Skin: Skin is warm and dry. She is not diaphoretic.  Nursing note and vitals reviewed.    UC Treatments / Results  Labs (all labs ordered are listed, but only abnormal results are displayed) Labs Reviewed - No data to display  EKG  EKG Interpretation None       Radiology No results found.  Procedures Procedures (including critical care time)  Medications Ordered in UC Medications - No data to display   Initial Impression / Assessment and Plan / UC Course  I have reviewed the triage vital signs and the nursing notes.  Pertinent labs & imaging results that were available during my care of the patient were reviewed by me and considered in my medical decision making (see chart for details).       Final Clinical Impressions(s) / UC Diagnoses   Final diagnoses:  Acute non-recurrent maxillary sinusitis    New Prescriptions New Prescriptions   LEVOFLOXACIN (LEVAQUIN) 500 MG TABLET    Take 1 tablet (500 mg total) by mouth daily.     Juline Patch, MD 09/11/16 [phone]

## 2016-09-11 NOTE — ED Triage Notes (Signed)
Patient c/o sinus pain and pressure and right ear pain that started on Thursday.

## 2016-10-08 ENCOUNTER — Encounter: Payer: Self-pay | Admitting: Physician Assistant

## 2016-10-08 ENCOUNTER — Ambulatory Visit (INDEPENDENT_AMBULATORY_CARE_PROVIDER_SITE_OTHER): Payer: Medicare Other | Admitting: Physician Assistant

## 2016-10-08 VITALS — BP 103/68 | HR 86 | Ht 68.0 in | Wt 181.0 lb

## 2016-10-08 DIAGNOSIS — N393 Stress incontinence (female) (male): Secondary | ICD-10-CM

## 2016-10-08 DIAGNOSIS — M797 Fibromyalgia: Secondary | ICD-10-CM | POA: Diagnosis not present

## 2016-10-08 DIAGNOSIS — R350 Frequency of micturition: Secondary | ICD-10-CM

## 2016-10-08 DIAGNOSIS — N3281 Overactive bladder: Secondary | ICD-10-CM | POA: Diagnosis not present

## 2016-10-08 LAB — POCT URINALYSIS DIPSTICK
Bilirubin, UA: NEGATIVE
Blood, UA: NEGATIVE
Glucose, UA: NEGATIVE
Ketones, UA: NEGATIVE
Nitrite, UA: NEGATIVE
Spec Grav, UA: 1.02 (ref 1.010–1.025)
Urobilinogen, UA: 0.2 E.U./dL
pH, UA: 5 (ref 5.0–8.0)

## 2016-10-08 NOTE — Progress Notes (Signed)
   Subjective:    Patient ID: Carrie Myers, female    DOB: Jun 27, 1945, 71 y.o.   MRN: [id number]  Carrie Myers is a 71 y.o. female presenting on 10/08/2016 for Urinary Frequency (Constantly having to go the bathroom all night and now all day. Having to wear a pad because she leaks small bits of urine throughout the day. This frequency has been going on for years but becoming worse than normal. Wakes up every two hours to use the bathroom. Not sleeping well because of this.  )   HPI   Patient is a 71 y/o woman presenting today with urinary frequency. She has had one child. She has a history of urinary incontinence since age 37, she has been wearing a pad since then. She has urinary incontinence especially when she coughs, sneezes, laughs. More recently, she has been having nocturia, going to the bathroom at night every 2 hours. This is disruptive towards her sleep and she feels poorly rested. She does not have any dysuria or hematuria. No SOB or lower extremity edema. She does have a history of fibromyalgia for which she takes Vicodin and tramadol chronically as prescribed by the pain clinic. Patient partakes in bathroom mapping and double voiding.   Social History  Substance Use Topics  . Smoking status: Former Smoker    Types: Cigarettes  . Smokeless tobacco: Former Systems developer    Quit date: 04/22/2006  . Alcohol use No    Review of Systems Per HPI unless specifically indicated above     Objective:    BP 103/68   Pulse 86   Ht 5\' 8"  (1.727 m)   Wt 181 lb (82.1 kg)   SpO2 96%   BMI 27.52 kg/m   Wt Readings from Last 3 Encounters:  10/08/16 181 lb (82.1 kg)  09/11/16 180 lb (81.6 kg)  09/07/16 180 lb (81.6 kg)    Physical Exam  Constitutional: She is oriented to person, place, and time. She appears well-developed and well-nourished.  Abdominal: Soft. Bowel sounds are normal. She exhibits no distension and no mass. There is no tenderness. There is no rebound, no guarding and  no CVA tenderness.  Musculoskeletal: She exhibits no edema.  Neurological: She is alert and oriented to person, place, and time.  Skin: Skin is warm and dry.  Psychiatric: She has a normal mood and affect. Her behavior is normal.   Results for orders placed or performed in visit on 05/12/16  ToxASSURE Select 13 (MW), Urine  Result Value Ref Range   ToxAssure Select 13 FINAL       Assessment & Plan:   1. Urinary frequency  Urine dipstick only with proteins and small leuks. Will send for culture. Patient with some stress incontinence and possible overactive bladder. Think this is compounded with her chronic opioid use causing some urinary retention. Hesitate to prescribe medication today for fear of worsening any baseline urinary retention. Will refer to urology.  - POCT Urinalysis Dipstick - Ambulatory referral to Urology - CULTURE, URINE COMPREHENSIVE  2. Stress incontinence  - Ambulatory referral to Urology  3. Overactive bladder  - Ambulatory referral to Urology  Return if symptoms worsen or fail to improve.    Carles Collet, PA-C  [facility] 10/08/2016, 2:09 PM

## 2016-10-08 NOTE — Patient Instructions (Signed)
Urinary Incontinence Urinary incontinence is the involuntary loss of urine from your bladder. What are the causes? There are many causes of urinary incontinence. They include:  Medicines.  Infections.  Prostatic enlargement, leading to overflow of urine from your bladder.  Surgery.  Neurological diseases.  Emotional factors.  What are the signs or symptoms? Urinary Incontinence can be divided into four types: 1. Urge incontinence. Urge incontinence is the involuntary loss of urine before you have the opportunity to go to the bathroom. There is a sudden urge to void but not enough time to reach a bathroom. 2. Stress incontinence. Stress incontinence is the sudden loss of urine with any activity that forces urine to pass. It is commonly caused by anatomical changes to the pelvis and sphincter areas of your body. 3. Overflow incontinence. Overflow incontinence is the loss of urine from an obstructed opening to your bladder. This results in a backup of urine and a resultant buildup of pressure within the bladder. When the pressure within the bladder exceeds the closing pressure of the sphincter, the urine overflows, which causes incontinence, similar to water overflowing a dam. 4. Total incontinence. Total incontinence is the loss of urine as a result of the inability to store urine within your bladder.  How is this diagnosed? Evaluating the cause of incontinence may require:  A thorough and complete medical and obstetric history.  A complete physical exam.  Laboratory tests such as a urine culture and sensitivities.  When additional tests are indicated, they can include:  An ultrasound exam.  Kidney and bladder X-rays.  Cystoscopy. This is an exam of the bladder using a narrow scope.  Urodynamic testing to test the nerve function to the bladder and sphincter areas.  How is this treated? Treatment for urinary incontinence depends on the cause:  For urge incontinence caused  by a bacterial infection, antibiotics will be prescribed. If the urge incontinence is related to medicines you take, your health care provider may have you change the medicine.  For stress incontinence, surgery to re-establish anatomical support to the bladder or sphincter, or both, will often correct the condition.  For overflow incontinence caused by an enlarged prostate, an operation to open the channel through the enlarged prostate will allow the flow of urine out of the bladder. In women with fibroids, a hysterectomy may be recommended.  For total incontinence, surgery on your urinary sphincter may help. An artificial urinary sphincter (an inflatable cuff placed around the urethra) may be required. In women who have developed a hole-like passage between their bladder and vagina (vesicovaginal fistula), surgery to close the fistula often is required.  Follow these instructions at home:  Normal daily hygiene and the use of pads or adult diapers that are changed regularly will help prevent odors and skin damage.  Avoid caffeine. It can overstimulate your bladder.  Use the bathroom regularly. Try about every 2-3 hours to go to the bathroom, even if you do not feel the need to do so. Take time to empty your bladder completely. After urinating, wait a minute. Then try to urinate again.  For causes involving nerve dysfunction, keep a log of the medicines you take and a journal of the times you go to the bathroom. Contact a health care provider if:  You experience worsening of pain instead of improvement in pain after your procedure.  Your incontinence becomes worse instead of better. Get help right away if:  You experience fever or shaking chills.  You are unable to   pass your urine.  You have redness spreading into your groin or down into your thighs. This information is not intended to replace advice given to you by your health care provider. Make sure you discuss any questions you have  with your health care provider. Document Released: 04/08/2004 Document Revised: 10/10/2015 Document Reviewed: 08/08/2012 [employer] Interactive Patient Education  2018 [employer]  

## 2016-10-12 LAB — CULTURE, URINE COMPREHENSIVE

## 2016-10-14 NOTE — Progress Notes (Signed)
10/15/2016 11:24 AM   Carrie Myers Mar 06, 1946 [id number]  Referring provider: [name], MD [address] [place],  [address]  Chief Complaint  Patient presents with  . New Patient (Initial Visit)    Urinary frequency referred by Dr. Seward Meth    HPI: Patient is a 71 -year-old Caucasian female who is referred to [place] by, Carrie Redwood, PA-C, for urinary incontinence.  Patient states that she has had urinary incontinence for years.  The night time symptoms over the last years.    Patient has incontinence with SUI and UI.   She is experiencing not a lot of  incontinent episodes during the day. She is experiencing incontinent episodes during the night when coughing.    Her incontinence volume is moderate.   She is wearing 1 to 3 pads/depends daily.    She is having associated urinary frequency x 2 hours, nocturia x 2-4, intermittency, hesitancy and straining to urinate.    She does not have a history of urinary tract infections, STI's or injury to the bladder.   She denies dysuria, gross hematuria, suprapubic pain, back pain, abdominal pain or flank pain.  She has not had any recent fevers, chills, nausea or vomiting.  She has not had any recent fevers, chills, nausea or vomiting.   She had kidney stones 20 years ago.  She does not have a history of GU surgery or GU trauma.   She is not sexually active.  She is post menopausal.  She admits to constipation and/or diarrhea.   She is not having pain with bladder filling.    She has not had any recent imaging studies.    She is drinking 10 to 16 ounces of water daily.   She is drinking 2 to 3 caffeinated beverages daily.  She is not drinking alcoholic beverages daily.    Her risk factors for incontinence are a family history of incontinence, age, caffeine, depression, fecal incontinence, vaginal atrophy and pelvic surgery.    She is taking benzo's, opioids, ACE inhibitors and antidepressants.  PVR is  18 mL.    PMH: Past Medical History:  Diagnosis Date  . Allergy   . Anxiety   . Arthritis    neck, hands, lower back  . Bilateral dry eyes   . Dental crowns present    Implants  . Fibromyalgia   . GERD (gastroesophageal reflux disease)   . Hyperlipidemia   . Hypertension   . Kidney stones   . Myocardial infarction ([place]) 04/22/2006  . Osteoarthritis   . Rheumatoid arthritis ([place])   . Thyroid disease     Surgical History: Past Surgical History:  Procedure Laterality Date  . ABDOMINAL HYSTERECTOMY     total  . APPENDECTOMY    . BREAST BIOPSY Right    neg  . BREAST CYST ASPIRATION Left    neg  . BUNIONECTOMY Bilateral   . CESAREAN SECTION    . COLONOSCOPY  2010   normal  . CORONARY ANGIOPLASTY WITH STENT PLACEMENT  04/2006  . dental implant     [age]  . SHOULDER ARTHROSCOPY Right 05/07/2015   Procedure: Extensive arthroscopic debridement and arthroscopic subacromial decompression, right shoulder.  2. Steroid injection right thumb CMC joint.;  Surgeon: Corky Mull, MD;  Location: [facility];  Service: Orthopedics;  Laterality: Right;    Home Medications:  Allergies as of 10/15/2016      Reactions   Augmentin [amoxicillin-pot Clavulanate] Nausea And Vomiting  Codeine Nausea And Vomiting   Sulfa Antibiotics Nausea And Vomiting   Shellfish Allergy Nausea And Vomiting, Nausea Only, Other (See Comments)   No issues with iodine      Medication List       Accurate as of 10/15/16 11:24 AM. Always use your most recent med list.          aspirin EC 81 MG tablet Take 81 mg by mouth daily.   citalopram 20 MG tablet Commonly known as:  CELEXA TAKE 1 TABLET BY MOUTH  DAILY   diazepam 5 MG tablet Commonly known as:  VALIUM Limit  1/2  tablet by mouth per day or every other day if tolerated   Diclofenac-Misoprostol 75-0.2 MG Tbec TAKE 1 TABLET BY MOUTH TWO  TIMES DAILY   esomeprazole 20 MG capsule Commonly known as:  NEXIUM Take 40 mg  by mouth daily. Over the Counter   HYDROcodone-acetaminophen 5-325 MG tablet Commonly known as:  NORCO/VICODIN Take 1 tablet by mouth 2 (two) times daily at 10 AM and 5 PM. Limit 1-2 tablets by mouth per day if tolerated   levothyroxine 88 MCG tablet Commonly known as:  SYNTHROID, LEVOTHROID TAKE 1 TABLET BY MOUTH  DAILY   losartan 50 MG tablet Commonly known as:  COZAAR Take 50 mg by mouth daily.   pravastatin 80 MG tablet Commonly known as:  PRAVACHOL Take 80 mg by mouth daily.   traMADol 50 MG tablet Commonly known as:  ULTRAM Take 1 tablet (50 mg total) by mouth every 6 (six) hours as needed.       Allergies:  Allergies  Allergen Reactions  . Augmentin [Amoxicillin-Pot Clavulanate] Nausea And Vomiting  . Codeine Nausea And Vomiting  . Sulfa Antibiotics Nausea And Vomiting  . Shellfish Allergy Nausea And Vomiting, Nausea Only and Other (See Comments)    No issues with iodine     Family History: Family History  Problem Relation Age of Onset  . Stroke Mother   . Heart disease Father   . Pancreatic cancer Sister   . Diabetes Sister   . Prostate cancer Brother   . Breast cancer Neg Hx   . Kidney cancer Neg Hx   . Bladder Cancer Neg Hx     Social History:  reports that she has quit smoking. Her smoking use included Cigarettes. She quit smokeless tobacco use about 10 years ago. She reports that she does not drink alcohol or use drugs.  ROS: UROLOGY Frequent Urination?: Yes Hard to postpone urination?: No Burning/pain with urination?: No Get up at night to urinate?: Yes Leakage of urine?: Yes Urine stream starts and stops?: Yes Trouble starting stream?: Yes Do you have to strain to urinate?: Yes Blood in urine?: No Urinary tract infection?: No Sexually transmitted disease?: No Injury to kidneys or bladder?: No Painful intercourse?: No Weak stream?: No Currently pregnant?: No Vaginal bleeding?: No Last menstrual period?: n  Gastrointestinal Nausea?:  No Vomiting?: No Indigestion/heartburn?: No Diarrhea?: No Constipation?: No  Constitutional Fever: No Night sweats?: No Weight loss?: No Fatigue?: Yes  Skin Skin rash/lesions?: No Itching?: No  Eyes Blurred vision?: No Double vision?: No  Ears/Nose/Throat Sore throat?: No Sinus problems?: Yes  Hematologic/Lymphatic Swollen glands?: No Easy bruising?: No  Cardiovascular Leg swelling?: No Chest pain?: No  Respiratory Cough?: No Shortness of breath?: No  Endocrine Excessive thirst?: No  Musculoskeletal Back pain?: Yes Joint pain?: Yes  Neurological Headaches?: No Dizziness?: No  Psychologic Depression?: No Anxiety?: No  Physical Exam: BP  122/74   Pulse 86   Ht 5\' 8"  (1.727 m)   Wt 181 lb (82.1 kg)   BMI 27.52 kg/m   Constitutional: Well nourished. Alert and oriented, No acute distress. HEENT: [place] AT, moist mucus membranes. Trachea midline, no masses. Cardiovascular: No clubbing, cyanosis, or edema. Respiratory: Normal respiratory effort, no increased work of breathing. GI: Abdomen is soft, non tender, non distended, no abdominal masses. Liver and spleen not palpable.  No hernias appreciated.  Stool sample for occult testing is not indicated.   GU: No CVA tenderness.  No bladder fullness or masses.  Atrophic external genitalia, normal pubic hair distribution, no lesions.  Normal urethral meatus, no lesions, no prolapse, no discharge.   No urethral masses, tenderness and/or tenderness. No bladder fullness, tenderness or masses. Pale vagina mucosa, good estrogen effect, no discharge, no lesions, good pelvic support, Grade II cystocele is noted.  Leakage with Valsalva.  No rectocele noted.  Cervix, uterus and adnexa are surgically absent.  Anus and perineum are without rashes or lesions.    Skin: No rashes, bruises or suspicious lesions. Lymph: No cervical or inguinal adenopathy. Neurologic: Grossly intact, no focal deficits, moving all 4  extremities. Psychiatric: Normal mood and affect.  Laboratory Data: Lab Results  Component Value Date   WBC 5.8 03/16/2016   HGB 13.0 03/16/2016   HCT 39.4 03/16/2016   MCV 88 03/16/2016   PLT 215 03/16/2016    Lab Results  Component Value Date   CREATININE 0.86 03/16/2016    Lab Results  Component Value Date   TSH 6.140 (H) 03/16/2016       Component Value Date/Time   CHOL 119 03/16/2016 1039   HDL 41 03/16/2016 1039   CHOLHDL 2.9 03/16/2016 1039   LDLCALC 47 03/16/2016 1039    Lab Results  Component Value Date   AST 27 03/16/2016   Lab Results  Component Value Date   ALT 30 03/16/2016    Urinalysis    Component Value Date/Time   BILIRUBINUR neg 10/08/2016 1437   PROTEINUR trace 10/08/2016 1437   UROBILINOGEN 0.2 10/08/2016 1437   NITRITE neg 10/08/2016 1437   LEUKOCYTESUR Negative 03/16/2016 1029    I have reviewed the labs.   Pertinent Imaging: Results for SHATONA, ANDUJAR (MRN [id number]) as of 10/15/2016 11:21  Ref. Range 10/15/2016 10:40  Scan Result Unknown 18   I have independently reviewed the films.    Assessment & Plan:    1. Stress incontinence  - offered behavioral therapies, bladder training and bladder control strategies  - pelvic floor muscle training - Kegel exercises  - fluid management - good water intake  - offered refer to gynecology for a pessary fitting - patient would like a referral  - RTC in one week for PVR and symptom recheck   2. Nocturia  - I explained to the patient that nocturia is often multi-factorial and difficult to treat.  Sleeping disorders, heart conditions, peripheral vascular disease, diabetes, an enlarged prostate for men, an urethral stricture causing bladder outlet obstruction and/or certain medications can contribute to nocturia.  - I have suggested that the patient avoid caffeine after noon and alcohol in the evening.  He or she may also benefit from fluid restrictions after 6:00 in the evening and  voiding just prior to bedtime.  - I have explained that research studies have showed that over 84% of patients with sleep apnea reported frequent nighttime urination.   With sleep apnea, oxygen decreases, carbon dioxide increases,  the blood become more acidic, the heart rate drops and blood vessels in the lung constrict.  The body is then alerted that something is very wrong. The sleeper must wake enough to reopen the airway. By this time, the heart is racing and experiences a false signal of fluid overload. The heart excretes a hormone-like protein that tells the body to get rid of sodium and water, resulting in nocturia.  -  I also informed the patient that a recent study noted that decreasing sodium intake to 2.3 grams daily, if they don't have issues with hyponatremia, can also reduce the number of nightly voids  - There is also an increased incidence in sleep apnea with menopause, symptoms include night sweats, daytime sleepiness, depressed mood, and cognitive complaints like poor concentration or problems with short-term memory   - The patient may benefit from a discussion with his or her primary care physician to see if he or she has risk factors for sleep apnea or other sleep disturbances and obtaining a sleep study.  3. Cystocele  - see above  Return in about 1 month (around 11/15/2016) for PVR and OAB questionnaire.  These notes generated with voice recognition software. I apologize for typographical errors.  Zara Council, [facility] [address], [place], [place] [address] [phone]

## 2016-10-15 ENCOUNTER — Encounter: Payer: Self-pay | Admitting: Urology

## 2016-10-15 ENCOUNTER — Ambulatory Visit (INDEPENDENT_AMBULATORY_CARE_PROVIDER_SITE_OTHER): Payer: Medicare Other | Admitting: Urology

## 2016-10-15 VITALS — BP 122/74 | HR 86 | Ht 68.0 in | Wt 181.0 lb

## 2016-10-15 DIAGNOSIS — R351 Nocturia: Secondary | ICD-10-CM | POA: Diagnosis not present

## 2016-10-15 DIAGNOSIS — N8111 Cystocele, midline: Secondary | ICD-10-CM

## 2016-10-15 DIAGNOSIS — N393 Stress incontinence (female) (male): Secondary | ICD-10-CM | POA: Diagnosis not present

## 2016-10-15 LAB — BLADDER SCAN AMB NON-IMAGING: SCAN RESULT: 18

## 2016-10-18 ENCOUNTER — Ambulatory Visit: Payer: Medicare Other | Admitting: Internal Medicine

## 2016-10-18 ENCOUNTER — Telehealth: Payer: Self-pay | Admitting: *Deleted

## 2016-10-18 NOTE — Telephone Encounter (Signed)
Spoke to patient re; procedure questions.  presedation instructions reviewed,  Patient verbalizes u/o information.

## 2016-10-28 DIAGNOSIS — H04123 Dry eye syndrome of bilateral lacrimal glands: Secondary | ICD-10-CM | POA: Diagnosis not present

## 2016-10-28 DIAGNOSIS — H1852 Epithelial (juvenile) corneal dystrophy: Secondary | ICD-10-CM | POA: Diagnosis not present

## 2016-10-28 DIAGNOSIS — H2513 Age-related nuclear cataract, bilateral: Secondary | ICD-10-CM | POA: Diagnosis not present

## 2016-10-28 DIAGNOSIS — H18833 Recurrent erosion of cornea, bilateral: Secondary | ICD-10-CM | POA: Diagnosis not present

## 2016-10-28 DIAGNOSIS — H018 Other specified inflammations of eyelid: Secondary | ICD-10-CM | POA: Diagnosis not present

## 2016-10-29 ENCOUNTER — Encounter: Payer: Self-pay | Admitting: Obstetrics & Gynecology

## 2016-10-29 ENCOUNTER — Ambulatory Visit (INDEPENDENT_AMBULATORY_CARE_PROVIDER_SITE_OTHER): Payer: Medicare Other | Admitting: Obstetrics & Gynecology

## 2016-10-29 VITALS — BP 130/80 | HR 86 | Ht 68.0 in | Wt 184.0 lb

## 2016-10-29 DIAGNOSIS — N8111 Cystocele, midline: Secondary | ICD-10-CM

## 2016-10-29 DIAGNOSIS — R32 Unspecified urinary incontinence: Secondary | ICD-10-CM | POA: Diagnosis not present

## 2016-10-29 NOTE — Progress Notes (Signed)
Cystocele/Rectocele Patient complains of a cystocele. Problem started several years ago. Symptoms include: urinary incontinence: moderate and discomfort: mild. Symptoms have gradually worsened. Pt mostly has NOCTURIA as her concern, also has frequency and dryness.  Not sexually active (10 yrs widow).  No recent UTI.  No vag bleeding.  Does not desire surgery if possible.  PMHx: She  has a past medical history of Allergy; Anxiety; Arthritis; Bilateral dry eyes; Dental crowns present; Fibromyalgia; GERD (gastroesophageal reflux disease); Hyperlipidemia; Hypertension; Kidney stones; Myocardial infarction ([place]) (04/22/2006); Osteoarthritis; Rheumatoid arthritis ([place]); and Thyroid disease. Also,  has a past surgical history that includes Appendectomy; Cesarean section; Abdominal hysterectomy; Bunionectomy (Bilateral); dental implant; Colonoscopy (2010); Coronary angioplasty with stent (04/2006); Shoulder arthroscopy (Right, 05/07/2015); Breast biopsy (Right); and Breast cyst aspiration (Left)., family history includes Diabetes in her sister; Heart disease in her father; Pancreatic cancer in her sister; Prostate cancer in her brother; Stroke in her mother.,  reports that she has quit smoking. Her smoking use included Cigarettes. She quit smokeless tobacco use about 10 years ago. She reports that she does not drink alcohol or use drugs.  She has a current medication list which includes the following prescription(s): aspirin ec, citalopram, diazepam, diclofenac-misoprostol, esomeprazole, hydrocodone-acetaminophen, levothyroxine, losartan, pravastatin, and tramadol. Also, is allergic to augmentin [amoxicillin-pot clavulanate]; codeine; sulfa antibiotics; and shellfish allergy.  Review of Systems  Constitutional: Negative for chills, fever and malaise/fatigue.  HENT: Negative for congestion, sinus pain and sore throat.   Eyes: Negative for blurred vision and pain.  Respiratory: Negative for cough and wheezing.     Cardiovascular: Negative for chest pain and leg swelling.  Gastrointestinal: Negative for abdominal pain, constipation, diarrhea, heartburn, nausea and vomiting.  Genitourinary: Negative for dysuria, frequency, hematuria and urgency.  Musculoskeletal: Negative for back pain, joint pain, myalgias and neck pain.  Skin: Negative for itching and rash.  Neurological: Negative for dizziness, tremors and weakness.  Endo/Heme/Allergies: Does not bruise/bleed easily.  Psychiatric/Behavioral: Negative for depression. The patient is not nervous/anxious and does not have insomnia.     Objective: BP 130/80   Pulse 86   Ht 5\' 8"  (1.727 m)   Wt 184 lb (83.5 kg)   BMI 27.98 kg/m  Physical Exam  Constitutional: She is oriented to person, place, and time. She appears well-developed and well-nourished. No distress.  Genitourinary: Vagina normal and uterus normal. Pelvic exam was performed with patient supine. There is no rash, tenderness or lesion on the right labia. There is no rash, tenderness or lesion on the left labia. No erythema or bleeding in the vagina. Right adnexum does not display mass and does not display tenderness. Left adnexum does not display mass and does not display tenderness. Cervix does not exhibit motion tenderness, discharge, polyp or nabothian cyst.   Uterus is mobile and midaxial. Uterus is not enlarged or exhibiting a mass.  Genitourinary Comments: Gr 1 cystocele Gr 2 rectocele  Abdominal: Soft. She exhibits no distension. There is no tenderness.  Musculoskeletal: Normal range of motion.  Neurological: She is alert and oriented to person, place, and time. No cranial nerve deficit.  Skin: Skin is warm and dry.  Psychiatric: She has a normal mood and affect.  Back- No CVAT  ASSESSMENT/PLAN:   Problem List Items Addressed This Visit        Visit Diagnoses    Cystocele, midline    -  Primary   Urinary incontinence, unspecified type        Pessary fitting today Ring #2  placed/  tolerated but fell out.  Will order ring #3. F/u one month May benefit from OAB meds which is being assessed by urology  Barnett Applebaum, MD, [facility], [facility] 10/29/2016  12:21 PM

## 2016-10-29 NOTE — Patient Instructions (Signed)
Pelvic Organ Prolapse Pelvic organ prolapse is the stretching, bulging, or dropping of pelvic organs into an abnormal position. It happens when the muscles and tissues that surround and support pelvic structures are stretched or weak. Pelvic organ prolapse can involve:  Vagina (vaginal prolapse).  Uterus (uterine prolapse).  Bladder (cystocele).  Rectum (rectocele).  Intestines (enterocele).  When organs other than the vagina are involved, they often bulge into the vagina or protrude from the vagina, depending on how severe the prolapse is. What are the causes? Causes of this condition include:  Pregnancy, labor, and childbirth.  Long-lasting (chronic) cough.  Chronic constipation.  Obesity.  Past pelvic surgery.  Aging. During and after menopause, a decreased production of the hormone estrogen can weaken pelvic ligaments and muscles.  Consistently lifting more than 50 lb (23 kg).  Buildup of fluid in the abdomen due to certain diseases and other conditions.  What are the signs or symptoms? Symptoms of this condition include:  Loss of bladder control when you cough, sneeze, strain, and exercise (stress incontinence). This may be worse immediately following childbirth, and it may gradually improve over time.  Feeling pressure in your pelvis or vagina. This pressure may increase when you cough or when you are having a bowel movement.  A bulge that protrudes from the opening of your vagina or against your vaginal wall. If your uterus protrudes through the opening of your vagina and rubs against your clothing, you may also experience soreness, ulcers, infection, pain, and bleeding.  Increased effort to have a bowel movement or urinate.  Pain in your low back.  Pain, discomfort, or disinterest in sexual intercourse.  Repeated bladder infections (urinary tract infections).  Difficulty inserting or inability to insert a tampon or applicator.  In some people, this  condition does not cause any symptoms. How is this diagnosed? Your health care provider may perform an internal and external vaginal and rectal exam. During the exam, you may be asked to cough and strain while you are lying down, sitting, and standing up. Your health care provider will determine if other tests are required, such as bladder function tests. How is this treated? In most cases, this condition needs to be treated only if it produces symptoms. No treatment is guaranteed to correct the prolapse or relieve the symptoms completely. Treatment may include:  Lifestyle changes, such as: ? Avoiding drinking beverages that contain caffeine. ? Increasing your intake of high-fiber foods. This can help to decrease constipation and straining during bowel movements. ? Emptying your bladder at scheduled times (bladder training therapy). This can help to reduce or avoid urinary incontinence. ? Losing weight if you are overweight or obese.  Estrogen. Estrogen may help mild prolapse by increasing the strength and tone of pelvic floor muscles.  Kegel exercises. These may help mild cases of prolapse by strengthening and tightening the muscles of the pelvic floor.  Pessary insertion. A pessary is a soft, flexible device that is placed into your vagina by your health care provider to help support the vaginal walls and keep pelvic organs in place.  Surgery. This is often the only form of treatment for severe prolapse. Different types of surgeries are available.  Follow these instructions at home:  Wear a sanitary pad or absorbent product if you have urinary incontinence.  Avoid heavy lifting and straining with exercise and work. Do not hold your breath when you perform mild to moderate lifting and exercise activities. Limit your activities as directed by your health care   provider.  Take medicines only as directed by your health care provider.  Perform Kegel exercises as directed by your health care  provider.  If you have a pessary, take care of it as directed by your health care provider. Contact a health care provider if:  Your symptoms interfere with your daily activities or sex life.  You need medicine to help with the discomfort.  You notice bleeding from the vagina that is not related to your period.  You have a fever.  You have pain or bleeding when you urinate.  You have bleeding when you have a bowel movement.  You lose urine when you have sex.  You have chronic constipation.  You have a pessary that falls out.  You have vaginal discharge that has a bad smell.  You have low abdominal pain or cramping that is unusual for you. This information is not intended to replace advice given to you by your health care provider. Make sure you discuss any questions you have with your health care provider. Document Released: 09/26/2013 Document Revised: 08/07/2015 Document Reviewed: 05/14/2013 Elsevier Interactive Patient Education  2018 [employer]  

## 2016-11-03 ENCOUNTER — Ambulatory Visit: Payer: Medicare Other | Admitting: Internal Medicine

## 2016-11-05 ENCOUNTER — Ambulatory Visit (INDEPENDENT_AMBULATORY_CARE_PROVIDER_SITE_OTHER): Payer: Medicare Other | Admitting: Internal Medicine

## 2016-11-05 ENCOUNTER — Encounter: Payer: Self-pay | Admitting: Internal Medicine

## 2016-11-05 VITALS — BP 108/62 | HR 80 | Ht 68.0 in | Wt 184.0 lb

## 2016-11-05 DIAGNOSIS — G4730 Sleep apnea, unspecified: Secondary | ICD-10-CM

## 2016-11-05 DIAGNOSIS — M47816 Spondylosis without myelopathy or radiculopathy, lumbar region: Secondary | ICD-10-CM

## 2016-11-05 DIAGNOSIS — M4696 Unspecified inflammatory spondylopathy, lumbar region: Secondary | ICD-10-CM | POA: Diagnosis not present

## 2016-11-05 DIAGNOSIS — I10 Essential (primary) hypertension: Secondary | ICD-10-CM | POA: Diagnosis not present

## 2016-11-05 NOTE — Progress Notes (Signed)
Date:  11/05/2016   Name:  Carrie Myers   DOB:  [birth year]   MRN:  [id number]   Chief Complaint: sleep study (Urology told her she needs sleep study. She has a bladder issue where she constantly goes to the bathroom at night and they want her to have a study. )  Frequent urination - being followed by urology. They're considering starting her on some new medication but wanted to rule out sleep apnea. Patient reports that she does snore but is not aware of gasping or pauses in breathing during sleep.  Snoring and fatigue - patient does suffer from snoring as well as fibromyalgia. Discussed well account for her fatigue but would like to rule out sleep apnea. She does score an 11 on the Epworth sleep scale.  Back pain -  This is chronic.  She takes chronic narcotic pain medications regularly. She is followed by the pain clinic. She recently had a steroid injection in her back.  Review of Systems  Constitutional: Positive for fatigue. Negative for chills and diaphoresis.  HENT: Negative for tinnitus.   Respiratory: Negative for chest tightness, shortness of breath and wheezing.        Snoring  Cardiovascular: Negative for chest pain, palpitations and leg swelling.  Gastrointestinal: Negative for abdominal pain.  Endocrine: Positive for polyuria.  Genitourinary: Positive for frequency and urgency.  Neurological: Negative for dizziness, tremors and headaches.  Psychiatric/Behavioral: Negative for dysphoric mood and sleep disturbance.    Patient Active Problem List   Diagnosis Date Noted  . Status post shoulder surgery 05/27/2015  . Impingement syndrome of right shoulder 04/17/2015  . Other shoulder lesions, right shoulder 04/17/2015  . Fibrocystic breast 01/18/2015  . Gastroesophageal reflux disease without esophagitis 01/18/2015  . Hypothyroidism, postablative 01/18/2015  . Fracture of right foot 01/14/2015  . Arteriosclerosis of coronary artery 12/29/2014  . DJD of shoulder  11/13/2014  . Fibromyalgia 10/02/2014  . Intercostal neuralgia 10/02/2014  . DDD (degenerative disc disease), lumbar 10/02/2014  . Facet syndrome, lumbar ([place]) 10/02/2014  . Sacroiliac joint disease 10/02/2014  . Carotid artery narrowing 07/09/2014  . Benign essential HTN 07/01/2014  . Mixed hyperlipidemia 09/22/2012  . Arthritis of hand, degenerative 09/22/2012    Prior to Admission medications   Medication Sig Start Date End Date Taking? Authorizing Provider  aspirin EC 81 MG tablet Take 81 mg by mouth daily.   Yes [provider]  citalopram (CELEXA) 20 MG tablet TAKE 1 TABLET BY MOUTH  DAILY 01/18/16  Yes [name], MD  diazepam (VALIUM) 5 MG tablet Limit  1/2  tablet by mouth per day or every other day if tolerated 09/07/16  Yes Molli Barrows, MD  Diclofenac-Misoprostol 75-0.2 MG TBEC TAKE 1 TABLET BY MOUTH TWO  TIMES DAILY 01/18/16  Yes [name], MD  esomeprazole (NEXIUM) 20 MG capsule Take 40 mg by mouth daily. Over the Counter   Yes [provider]  HYDROcodone-acetaminophen (NORCO/VICODIN) 5-325 MG tablet Take 1 tablet by mouth 2 (two) times daily at 10 AM and 5 PM. Limit 1-2 tablets by mouth per day if tolerated 09/07/16  Yes Molli Barrows, MD  levothyroxine (SYNTHROID, LEVOTHROID) 88 MCG tablet TAKE 1 TABLET BY MOUTH  DAILY 03/26/16  Yes [name], MD  losartan (COZAAR) 50 MG tablet Take 50 mg by mouth daily.   Yes [provider]  pravastatin (PRAVACHOL) 80 MG tablet Take 80 mg by mouth daily.   Yes [provider]  traMADol (ULTRAM) 50 MG tablet Take 1 tablet (50 mg total) by mouth every 6 (six) hours as needed. 09/07/16  Yes Molli Barrows, MD  RESTASIS 0.05 % ophthalmic emulsion Take 1 drop by mouth 2 (two) times daily. 10/28/16   [provider]    Allergies  Allergen Reactions  . Augmentin [Amoxicillin-Pot Clavulanate] Nausea And Vomiting  . Codeine Nausea And Vomiting  . Sulfa Antibiotics Nausea And  Vomiting  . Shellfish Allergy Nausea And Vomiting, Nausea Only and Other (See Comments)    No issues with iodine     Past Surgical History:  Procedure Laterality Date  . ABDOMINAL HYSTERECTOMY     total  . APPENDECTOMY    . BREAST BIOPSY Right    neg  . BREAST CYST ASPIRATION Left    neg  . BUNIONECTOMY Bilateral   . CESAREAN SECTION    . COLONOSCOPY  2010   normal  . CORONARY ANGIOPLASTY WITH STENT PLACEMENT  04/2006  . dental implant     [age]  . SHOULDER ARTHROSCOPY Right 05/07/2015   Procedure: Extensive arthroscopic debridement and arthroscopic subacromial decompression, right shoulder.  2. Steroid injection right thumb CMC joint.;  Surgeon: Corky Mull, MD;  Location: [facility];  Service: Orthopedics;  Laterality: Right;    Social History  Substance Use Topics  . Smoking status: Former Smoker    Types: Cigarettes  . Smokeless tobacco: Former Systems developer    Quit date: 04/22/2006     Comment: quit 8 years  . Alcohol use No     Medication list has been reviewed and updated.   Physical Exam  Constitutional: She is oriented to person, place, and time. She appears well-developed. No distress.  HENT:  Head: Normocephalic and atraumatic.  Cardiovascular: Normal rate, regular rhythm and normal heart sounds.   Pulmonary/Chest: Effort normal and breath sounds normal. No respiratory distress. She has no wheezes.  Musculoskeletal: Normal range of motion.  Neurological: She is alert and oriented to person, place, and time.  Skin: Skin is warm and dry. No rash noted.  Psychiatric: She has a normal mood and affect. Her behavior is normal. Thought content normal.  Nursing note and vitals reviewed.   BP 108/62   Pulse 80   Ht 5\' 8"  (1.727 m)   Wt 184 lb (83.5 kg)   SpO2 96%   BMI 27.98 kg/m   Assessment and Plan: 1. Sleep disorder breathing Epworth scale = 11 - Nocturnal polysomnography; Future  2. Benign essential HTN controlled  3.  Facet syndrome, lumbar ([place]) Followed by pain management   No orders of the defined types were placed in this encounter. Partially dictated using Editor, commissioning. Any errors are unintentional.   Halina Maidens, MD [facility]  11/05/2016

## 2016-11-17 ENCOUNTER — Ambulatory Visit
Admission: RE | Admit: 2016-11-17 | Discharge: 2016-11-17 | Disposition: A | Discharge status: Home / Self Care | Payer: Medicare Other | Source: Ambulatory Visit | Attending: Anesthesiology | Admitting: Anesthesiology

## 2016-11-17 ENCOUNTER — Ambulatory Visit (HOSPITAL_BASED_OUTPATIENT_CLINIC_OR_DEPARTMENT_OTHER): Payer: Medicare Other | Admitting: Anesthesiology

## 2016-11-17 ENCOUNTER — Other Ambulatory Visit: Payer: Self-pay | Admitting: Anesthesiology

## 2016-11-17 ENCOUNTER — Encounter: Payer: Self-pay | Admitting: Anesthesiology

## 2016-11-17 VITALS — BP 162/81 | HR 74 | Temp 97.7°F | Resp 16 | Ht 68.0 in | Wt 184.0 lb

## 2016-11-17 DIAGNOSIS — I10 Essential (primary) hypertension: Secondary | ICD-10-CM | POA: Diagnosis not present

## 2016-11-17 DIAGNOSIS — Z823 Family history of stroke: Secondary | ICD-10-CM | POA: Insufficient documentation

## 2016-11-17 DIAGNOSIS — M4696 Unspecified inflammatory spondylopathy, lumbar region: Secondary | ICD-10-CM

## 2016-11-17 DIAGNOSIS — M19011 Primary osteoarthritis, right shoulder: Secondary | ICD-10-CM | POA: Diagnosis not present

## 2016-11-17 DIAGNOSIS — M5441 Lumbago with sciatica, right side: Secondary | ICD-10-CM

## 2016-11-17 DIAGNOSIS — Z9889 Other specified postprocedural states: Secondary | ICD-10-CM | POA: Insufficient documentation

## 2016-11-17 DIAGNOSIS — Z8249 Family history of ischemic heart disease and other diseases of the circulatory system: Secondary | ICD-10-CM | POA: Diagnosis not present

## 2016-11-17 DIAGNOSIS — M5432 Sciatica, left side: Secondary | ICD-10-CM

## 2016-11-17 DIAGNOSIS — Z87442 Personal history of urinary calculi: Secondary | ICD-10-CM | POA: Diagnosis not present

## 2016-11-17 DIAGNOSIS — M5386 Other specified dorsopathies, lumbar region: Secondary | ICD-10-CM | POA: Insufficient documentation

## 2016-11-17 DIAGNOSIS — M797 Fibromyalgia: Secondary | ICD-10-CM | POA: Insufficient documentation

## 2016-11-17 DIAGNOSIS — Z800 Family history of malignant neoplasm of digestive organs: Secondary | ICD-10-CM | POA: Insufficient documentation

## 2016-11-17 DIAGNOSIS — M19012 Primary osteoarthritis, left shoulder: Secondary | ICD-10-CM | POA: Insufficient documentation

## 2016-11-17 DIAGNOSIS — Z7982 Long term (current) use of aspirin: Secondary | ICD-10-CM | POA: Insufficient documentation

## 2016-11-17 DIAGNOSIS — F419 Anxiety disorder, unspecified: Secondary | ICD-10-CM | POA: Insufficient documentation

## 2016-11-17 DIAGNOSIS — K219 Gastro-esophageal reflux disease without esophagitis: Secondary | ICD-10-CM | POA: Diagnosis not present

## 2016-11-17 DIAGNOSIS — E079 Disorder of thyroid, unspecified: Secondary | ICD-10-CM | POA: Insufficient documentation

## 2016-11-17 DIAGNOSIS — Z955 Presence of coronary angioplasty implant and graft: Secondary | ICD-10-CM | POA: Diagnosis not present

## 2016-11-17 DIAGNOSIS — M5136 Other intervertebral disc degeneration, lumbar region: Secondary | ICD-10-CM | POA: Insufficient documentation

## 2016-11-17 DIAGNOSIS — Z833 Family history of diabetes mellitus: Secondary | ICD-10-CM | POA: Diagnosis not present

## 2016-11-17 DIAGNOSIS — I219 Acute myocardial infarction, unspecified: Secondary | ICD-10-CM | POA: Insufficient documentation

## 2016-11-17 DIAGNOSIS — Z90710 Acquired absence of both cervix and uterus: Secondary | ICD-10-CM | POA: Insufficient documentation

## 2016-11-17 DIAGNOSIS — E785 Hyperlipidemia, unspecified: Secondary | ICD-10-CM | POA: Diagnosis not present

## 2016-11-17 DIAGNOSIS — G2581 Restless legs syndrome: Secondary | ICD-10-CM | POA: Diagnosis not present

## 2016-11-17 DIAGNOSIS — I252 Old myocardial infarction: Secondary | ICD-10-CM | POA: Insufficient documentation

## 2016-11-17 DIAGNOSIS — Z87891 Personal history of nicotine dependence: Secondary | ICD-10-CM | POA: Insufficient documentation

## 2016-11-17 DIAGNOSIS — R52 Pain, unspecified: Secondary | ICD-10-CM

## 2016-11-17 DIAGNOSIS — M5382 Other specified dorsopathies, cervical region: Secondary | ICD-10-CM | POA: Insufficient documentation

## 2016-11-17 DIAGNOSIS — Z79899 Other long term (current) drug therapy: Secondary | ICD-10-CM | POA: Insufficient documentation

## 2016-11-17 DIAGNOSIS — M47816 Spondylosis without myelopathy or radiculopathy, lumbar region: Secondary | ICD-10-CM

## 2016-11-17 DIAGNOSIS — M069 Rheumatoid arthritis, unspecified: Secondary | ICD-10-CM | POA: Diagnosis not present

## 2016-11-17 MED ORDER — LACTATED RINGERS IV SOLN
1000.0000 mL | INTRAVENOUS | Status: DC
  Administered 2016-11-17: 1000 mL via INTRAVENOUS

## 2016-11-17 MED ORDER — IOPAMIDOL (ISOVUE-M 200) INJECTION 41%
20.0000 mL | Freq: Once | INTRAMUSCULAR | Status: DC | PRN
  Administered 2016-11-17: 10 mL
  Filled 2016-11-17: qty 20

## 2016-11-17 MED ORDER — MIDAZOLAM HCL 5 MG/5ML IJ SOLN
5.0000 mg | Freq: Once | INTRAMUSCULAR | Status: DC
Start: 2016-11-17 — End: 2016-11-18
  Filled 2016-11-17: qty 5

## 2016-11-17 MED ORDER — SODIUM CHLORIDE 0.9% FLUSH
10.0000 mL | Freq: Once | INTRAVENOUS | Status: AC
  Administered 2016-11-17: 10 mL

## 2016-11-17 MED ORDER — ROPIVACAINE HCL 2 MG/ML IJ SOLN
10.0000 mL | Freq: Once | INTRAMUSCULAR | Status: AC
  Administered 2016-11-17: 10 mL via EPIDURAL
  Filled 2016-11-17: qty 10

## 2016-11-17 MED ORDER — LIDOCAINE HCL (PF) 1 % IJ SOLN
5.0000 mL | Freq: Once | INTRAMUSCULAR | Status: AC
  Administered 2016-11-17: 5 mL via SUBCUTANEOUS
  Filled 2016-11-17: qty 5

## 2016-11-17 MED ORDER — TRIAMCINOLONE ACETONIDE 40 MG/ML IJ SUSP
40.0000 mg | Freq: Once | INTRAMUSCULAR | Status: AC
  Administered 2016-11-17: 40 mg
  Filled 2016-11-17: qty 1

## 2016-11-17 MED ORDER — DIAZEPAM 5 MG PO TABS: ORAL_TABLET | ORAL | 1 refills | Status: DC

## 2016-11-17 MED ORDER — HYDROCODONE-ACETAMINOPHEN 5-325 MG PO TABS: 1.0000 | ORAL_TABLET | Freq: Two times a day (BID) | ORAL | 0 refills | Status: DC

## 2016-11-17 NOTE — Progress Notes (Signed)
Nursing Pain Medication Assessment:  ?Safety precautions to be maintained throughout the outpatient stay will include: orient to surroundings, keep bed in low position, maintain call bell within reach at all times, provide assistance with transfer out of bed and ambulation.  ?Medication Inspection Compliance: Carrie Myers did not comply with our request to bring her pills to be counted. She was reminded that bringing the medication bottles, even when empty, is a requirement. ? ?Medication: None brought in. ?Pill/Patch Count: None available to be counted. ?Bottle Appearance: No container available. Did not bring bottle(s) to appointment. ?Filled Date: N/A ?Last Medication intake:  Today ?

## 2016-11-17 NOTE — Patient Instructions (Signed)
Pain Management Discharge Instructions  General Discharge Instructions :  If you need to reach your doctor call: Monday-Friday 8:00 am - 4:00 pm at [phone] or toll free [phone].  After clinic hours [phone] to have operator reach doctor.  Bring all of your medication bottles to all your appointments in the pain clinic.  To cancel or reschedule your appointment with Pain Management please remember to call 24 hours in advance to avoid a fee.  Refer to the educational materials which you have been given on: General Risks, I had my Procedure. Discharge Instructions, Post Sedation.  Post Procedure Instructions:  The drugs you were given will stay in your system until tomorrow, so for the next 24 hours you should not drive, make any legal decisions or drink any alcoholic beverages.  You may eat anything you prefer, but it is better to start with liquids then soups and crackers, and gradually work up to solid foods.  Please notify your doctor immediately if you have any unusual bleeding, trouble breathing or pain that is not related to your normal pain.  Depending on the type of procedure that was done, some parts of your body may feel week and/or numb.  This usually clears up by tonight or the next day.  Walk with the use of an assistive device or accompanied by an adult for the 24 hours.  You may use ice on the affected area for the first 24 hours.  Put ice in a Ziploc bag and cover with a towel and place against area 15 minutes on 15 minutes off.  You may switch to heat after 24 hours.Epidural Steroid Injection An epidural steroid injection is a shot of steroid medicine and numbing medicine that is given into the space between the spinal cord and the bones in your back (epidural space). The shot helps relieve pain caused by an irritated or swollen nerve root. The amount of pain relief you get from the injection depends on what is causing the nerve to be swollen and irritated,  and how long your pain lasts. You are more likely to benefit from this injection if your pain is strong and comes on suddenly rather than if you have had pain for a long time. Tell a health care provider about:  Any allergies you have.  All medicines you are taking, including vitamins, herbs, eye drops, creams, and over-the-counter medicines.  Any problems you or family members have had with anesthetic medicines.  Any blood disorders you have.  Any surgeries you have had.  Any medical conditions you have.  Whether you are pregnant or may be pregnant. What are the risks? Generally, this is a safe procedure. However, problems may occur, including:  Headache.  Bleeding.  Infection.  Allergic reaction to medicines.  Damage to your nerves.  What happens before the procedure? Staying hydrated Follow instructions from your health care provider about hydration, which may include:  Up to 2 hours before the procedure - you may continue to drink clear liquids, such as water, clear fruit juice, black coffee, and plain tea.  Eating and drinking restrictions Follow instructions from your health care provider about eating and drinking, which may include:  8 hours before the procedure - stop eating heavy meals or foods such as meat, fried foods, or fatty foods.  6 hours before the procedure - stop eating light meals or foods, such as toast or cereal.  6 hours before the procedure - stop drinking milk or drinks that contain milk.    2 hours before the procedure - stop drinking clear liquids. Medicine  You may be given medicines to lower anxiety.  Ask your health care provider about:  Changing or stopping your regular medicines. This is especially important if you are taking diabetes medicines or blood thinners.  Taking medicines such as aspirin and ibuprofen. These medicines can thin your blood. Do not take these medicines before your procedure if your health care provider instructs  you not to. General instructions  Plan to have someone take you home from the hospital or clinic. What happens during the procedure?  You may receive a medicine to help you relax (sedative).  You will be asked to lie on your abdomen.  The injection site will be cleaned.  A numbing medicine (local anesthetic) will be used to numb the injection site.  A needle will be inserted through your skin into the epidural space. You may feel some discomfort when this happens. An X-ray machine will be used to make sure the needle is put as close as possible to the affected nerve.  A steroid medicine and a local anesthetic will be injected into the epidural space.  The needle will be removed.  A bandage (dressing) will be put over the injection site. What happens after the procedure?  Your blood pressure, heart rate, breathing rate, and blood oxygen level will be monitored until the medicines you were given have worn off.  Your arm or leg may feel weak or numb for a few hours.  The injection site may feel sore.  Do not drive for 24 hours if you received a sedative. This information is not intended to replace advice given to you by your health care provider. Make sure you discuss any questions you have with your health care provider. Document Released: 06/08/2007 Document Revised: 08/13/2015 Document Reviewed: 06/17/2015 [employer] Interactive Patient Education  2017 [employer]  

## 2016-11-18 ENCOUNTER — Telehealth: Payer: Self-pay

## 2016-11-18 NOTE — Progress Notes (Signed)
Subjective:  Patient ID: Carrie Myers, female    DOB: 04-09-1945  Age: 71 y.o. MRN: [id number]  CC: Back Pain (lower)   Service Provided on Last Visit: Procedure  PROCEDURE:L5-S1 lumbar epidural steroid under fluoroscopic guidance with moderate sedation  HPI this is Carrie Myers presents today for reevaluation. She was last seen a few months ago which point she had her first epidural steroid injection. She reports that she received approximately 50% reduction in her low back pain and lower extremity pain. At this point she is doing reasonably well and desires to proceed with a repeat injection. The quality characteristic and distribution of her pain remains stable in nature with no change in lower extremity strength or function. Her bowel and bladder function been stable as well. She still utilizing Vicodin for pain relief and breakthrough pain during the day which works well for her. Based on her narcotic assessment sheet she continues to derive good lifestyle functional improvement with medications as they help her sleep better at night and have less frequent and severe pain during the days. She is currently using 1 tablet a day on average and using Valium periodically at night to help with muscle relaxation and sleep. This is also been well tolerated with minimal side effects.   By history she presents as a previous patient of Dr. Belenda Cruise crisp. She desires to keep her pain management here in town and has asked to be referred to me for evaluation. She has a long-standing history of low back pain and diffuse systemic body pain for 17 years and is on medication management for that. She describes pain in the low back which is her primary pain and diffuse shoulder posterior shoulder hip buttock and lower extremity pain consistent with fibromyalgia as well. Her low back pain radiates into the left lower extremity and she has had previous epidural steroid injections for that which has effectively  relieved her sciatica. She has some of the symptoms now but not severe and currently manageable. She also has intermittent neck and shoulder pain and has had previous injection therapy for that without significant relief. She describes pain that's active throughout the day and aggravated by bending kneeling lifting motion and standing. Twisting also aggravates the pain and alleviating factors include rest and medication management and epidural steroids for her back pain. The pain is agonizing generally consistent throughout the day and only managed well with her medication management and occasionally with some of her exercises. She has associated intermittent weakness to the lower extremities as well. As mentioned she has had previous epidural steroid injections and a previous MRI with no significant changes in her symptoms since her MRI.  History Kindal has a past medical history of Allergy; Anxiety; Arthritis; Bilateral dry eyes; Dental crowns present; Fibromyalgia; GERD (gastroesophageal reflux disease); Hyperlipidemia; Hypertension; Kidney stones; Myocardial infarction ([place]) (04/22/2006); Osteoarthritis; Rheumatoid arthritis ([place]); and Thyroid disease.   She has a past surgical history that includes Appendectomy; Cesarean section; Abdominal hysterectomy; Bunionectomy (Bilateral); dental implant; Colonoscopy (2010); Coronary angioplasty with stent (04/2006); Shoulder arthroscopy (Right, 05/07/2015); Breast biopsy (Right); Breast cyst aspiration (Left); and epidural steroid injection (2018).   Her family history includes Diabetes in her sister; Heart disease in her father; Pancreatic cancer in her sister; Prostate cancer in her brother; Stroke in her mother.She reports that she has quit smoking. Her smoking use included Cigarettes. She quit smokeless tobacco use about 10 years ago. She reports that she does not drink alcohol or use drugs.  Results  for orders placed in visit on 11/11/09  MR [place] W/O Cm    Narrative * PRIOR REPORT IMPORTED FROM AN EXTERNAL SYSTEM *   PRIOR REPORT IMPORTED FROM THE [facility] WORKFLOW SYSTEM   REASON FOR EXAM:    sciatica pain  COMMENTS:   PROCEDURE:     MMR - MMR LUMBAR SPINE WO CONTRAST  - Nov 11 2009  9:50AM   RESULT:     MRI LUMBAR SPINE WITHOUT CONTRAST   HISTORY: Sciatic pain   COMPARISON: None   TECHNIQUE: Multiplanar and multisequence MRI of the lumbar spine were  obtained, without administration of IV contrast.   FINDINGS:   The vertebral body heights are maintained. There is grade 1  anterolisthesis  of L4 on L5. There is normal bone marrow signal demonstrated throughout  the  vertebra. The intervertebral disc spaces are well-maintained.   The spinal cord is of normal volume and contour. The cord terminates  normally at L1 . The nerve roots of the cauda equina and the filum  terminale  have the usual appearance.   The visualized portions of the SI joints are unremarkable.   The imaged intra-abdominal contents are unremarkable.   T11-T12: There is a small left paracentral disc protrusion.   T12-L1: No significant disc bulge. No evidence of neural foraminal or  central stenosis.   L1-L2: No significant disc bulge. No evidence of neural foraminal or  central  stenosis.   L2-L3: No significant disc bulge. No evidence of neural foraminal or  central  stenosis.   L3-L4: Mild broad-based disc bulge with an annular tear. No evidence of  neural foraminal or central stenosis.   L4-L5: Mild broad-based disc bulge. Severe bilateral facet arthropathy  with  ligamentum flavum infolding resulting in moderate spinal stenosis. There  is  moderate left foraminal stenosis.   L5-S1: A tiny central disc protrusion without significant impression on  the  ventral thecal sac. No evidence of neural foraminal or central stenosis.   IMPRESSION:   1. Lumbar spine spondylosis as described above.        ToxAssure Select 13  Date Value Ref  Range Status  05/12/2016 FINAL  Final    Comment:    ==================================================================== TOXASSURE SELECT 13 (MW) ==================================================================== Test                             Result       Flag       Units Drug Present and Declared for Prescription Verification   Desmethyldiazepam              220          EXPECTED   ng/mg creat   Oxazepam                       417          EXPECTED   ng/mg creat   Temazepam                      286          EXPECTED   ng/mg creat    Desmethyldiazepam, oxazepam, and temazepam are expected    metabolites of diazepam. Desmethyldiazepam and oxazepam are also    expected metabolites of other drugs, including chlordiazepoxide,    prazepam, clorazepate, and halazepam. Oxazepam is an expected    metabolite of temazepam. Oxazepam and temazepam  are also    available as scheduled prescription medications.   Tramadol                       PRESENT      EXPECTED   O-Desmethyltramadol            PRESENT      EXPECTED   N-Desmethyltramadol            PRESENT      EXPECTED    Source of tramadol is a prescription medication.    O-desmethyltramadol and N-desmethyltramadol are expected    metabolites of tramadol. Drug Absent but Declared for Prescription Verification   Hydrocodone                    Not Detected UNEXPECTED ng/mg creat ==================================================================== Test                      Result    Flag   Units      Ref Range   Creatinine              35               mg/dL      >=20 ==================================================================== Declared Medications:  The flagging and interpretation on this report are based on the  following declared medications.  Unexpected results may arise from  inaccuracies in the declared medications.  **Note: The testing scope of this panel includes these medications:  Diazepam (Valium)  Hydrocodone (Hycodan)   Hydrocodone (Hydrocodone-Acetaminophen)  Tramadol  **Note: The testing scope of this panel does not include following  reported medications:  Acetaminophen (Hydrocodone-Acetaminophen)  Cefdinir  Citalopram (Celexa)  Diclofenac  Homatropine (Hycodan)  Levothyroxine  Losartan (Cozaar)  Omeprazole (Nexium)  Oseltamivir (Tamiflu)  Pravastatin  Prednisone (Deltasone) ==================================================================== For clinical consultation, please call [phone]. ====================================================================     Outpatient Medications Prior to Visit  Medication Sig Dispense Refill  . aspirin EC 81 MG tablet Take 81 mg by mouth daily.    . citalopram (CELEXA) 20 MG tablet TAKE 1 TABLET BY MOUTH  DAILY 90 tablet 3  . Diclofenac-Misoprostol 75-0.2 MG TBEC TAKE 1 TABLET BY MOUTH TWO  TIMES DAILY 180 tablet 3  . esomeprazole (NEXIUM) 20 MG capsule Take 40 mg by mouth daily. Over the Counter    . levothyroxine (SYNTHROID, LEVOTHROID) 88 MCG tablet TAKE 1 TABLET BY MOUTH  DAILY 90 tablet 3  . losartan (COZAAR) 50 MG tablet Take 50 mg by mouth daily.    . pravastatin (PRAVACHOL) 80 MG tablet Take 80 mg by mouth daily.    . RESTASIS 0.05 % ophthalmic emulsion Take 1 drop by mouth 2 (two) times daily.  12  . traMADol (ULTRAM) 50 MG tablet Take 1 tablet (50 mg total) by mouth every 6 (six) hours as needed. 90 tablet 5  . diazepam (VALIUM) 5 MG tablet Limit  1/2  tablet by mouth per day or every other day if tolerated 15 tablet 1  . HYDROcodone-acetaminophen (NORCO/VICODIN) 5-325 MG tablet Take 1 tablet by mouth 2 (two) times daily at 10 AM and 5 PM. Limit 1-2 tablets by mouth per day if tolerated 30 tablet 0   No facility-administered medications prior to visit.    Lab Results  Component Value Date   WBC 5.8 03/16/2016   HGB 13.0 03/16/2016   HCT 39.4 03/16/2016   PLT 215 03/16/2016   GLUCOSE 92 03/16/2016  CHOL 119 03/16/2016   TRIG 154  (H) 03/16/2016   HDL 41 03/16/2016   LDLCALC 47 03/16/2016   ALT 30 03/16/2016   AST 27 03/16/2016   NA 141 03/16/2016   K 4.6 03/16/2016   CL 99 03/16/2016   CREATININE 0.86 03/16/2016   BUN 15 03/16/2016   CO2 27 03/16/2016   TSH 6.140 (H) 03/16/2016    --------------------------------------------------------------------------------------------------------------------- No results found.     ---------------------------------------------------------------------------------------------------------------------- Past Medical History:  Diagnosis Date  . Allergy   . Anxiety   . Arthritis    neck, hands, lower back  . Bilateral dry eyes   . Dental crowns present    Implants  . Fibromyalgia   . GERD (gastroesophageal reflux disease)   . Hyperlipidemia   . Hypertension   . Kidney stones   . Myocardial infarction ([place]) 04/22/2006  . Osteoarthritis   . Rheumatoid arthritis ([place])   . Thyroid disease     Past Surgical History:  Procedure Laterality Date  . ABDOMINAL HYSTERECTOMY     total  . APPENDECTOMY    . BREAST BIOPSY Right    neg  . BREAST CYST ASPIRATION Left    neg  . BUNIONECTOMY Bilateral   . CESAREAN SECTION    . COLONOSCOPY  2010   normal  . CORONARY ANGIOPLASTY WITH STENT PLACEMENT  04/2006  . dental implant     [age]  . epidural steroid injection  2018  . SHOULDER ARTHROSCOPY Right 05/07/2015   Procedure: Extensive arthroscopic debridement and arthroscopic subacromial decompression, right shoulder.  2. Steroid injection right thumb CMC joint.;  Surgeon: Corky Mull, MD;  Location: [place];  Service: Orthopedics;  Laterality: Right;    Family History  Problem Relation Age of Onset  . Stroke Mother   . Heart disease Father   . Pancreatic cancer Sister   . Diabetes Sister   . Prostate cancer Brother   . Breast cancer Neg Hx   . Kidney cancer Neg Hx   . Bladder Cancer Neg Hx     Social History  Substance Use  Topics  . Smoking status: Former Smoker    Types: Cigarettes  . Smokeless tobacco: Former Systems developer    Quit date: 04/22/2006     Comment: quit 8 years  . Alcohol use No    ---------------------------------------------------------------------------------------------------------------------- Social History   Social History  . Marital status: Widowed    Spouse name: N/A  . Number of children: N/A  . Years of education: N/A   Social History Main Topics  . Smoking status: Former Smoker    Types: Cigarettes  . Smokeless tobacco: Former Systems developer    Quit date: 04/22/2006     Comment: quit 8 years  . Alcohol use No  . Drug use: No  . Sexual activity: Not Asked   Other Topics Concern  . None   Social History Narrative  . None     BP (!) 162/81   Pulse 74   Temp 97.7 F (36.5 C) (Oral)   Resp 16   Ht 5' 8"  (1.727 m)   Wt 184 lb (83.5 kg)   SpO2 93%   BMI 27.98 kg/m    BP Readings from Last 3 Encounters:  11/17/16 (!) 162/81  11/05/16 108/62  10/29/16 130/80     Wt Readings from Last 3 Encounters:  11/17/16 184 lb (83.5 kg)  11/05/16 184 lb (83.5 kg)  10/29/16 184 lb (83.5 kg)     ----------------------------------------------------------------------------------------------------------------------  ROS  Review of Systems  Cardiac: High blood pressure previous heart attack in 2008 and previous stent. GI: Minimal constipation Pulmonary: No shortness of breath CNS: No sedation  Objective:  BP (!) 162/81   Pulse 74   Temp 97.7 F (36.5 C) (Oral)   Resp 16   Ht 5' 8"  (1.727 m)   Wt 184 lb (83.5 kg)   SpO2 93%   BMI 27.98 kg/m   Physical Exam Patient is alert oriented cooperative compliant and a good historian.  Her heart is regular rate and rhythm without murmur She ambulates with a minimally antalgic gait but her strength appears to be well-preserved.. she does have some mild paraspinous muscle tenderness bilaterally   Assessment & Plan:   Nioma was seen  today for back pain.  Diagnoses and all orders for this visit:  DDD (degenerative disc disease), lumbar -     Lumbar Epidural Injection; Future  Sciatica of left side -     Lumbar Epidural Injection -     Lumbar Epidural Injection; Future -     triamcinolone acetonide (KENALOG-40) injection 40 mg; 1 mL (40 mg total) by Other route once. -     sodium chloride flush (NS) 0.9 % injection 10 mL; 10 mLs by Other route once. -     ropivacaine (PF) 2 mg/mL (0.2%) (NAROPIN) injection 10 mL; 10 mLs by Epidural route once. -     midazolam (VERSED) 5 MG/5ML injection 5 mg; Inject 5 mLs (5 mg total) into the vein once. -     lidocaine (PF) (XYLOCAINE) 1 % injection 5 mL; Inject 5 mLs into the skin once. -     lactated ringers infusion 1,000 mL; Inject 1,000 mLs into the vein continuous. -     iopamidol (ISOVUE-M) 41 % intrathecal injection 20 mL; 20 mLs by Other route once as needed for contrast.  Fibromyalgia  Facet syndrome, lumbar (HCC)  Acute right-sided low back pain with right-sided sciatica -     Lumbar Epidural Injection  Other orders -     HYDROcodone-acetaminophen (NORCO/VICODIN) 5-325 MG tablet; Take 1 tablet by mouth 2 (two) times daily at 10 AM and 5 PM. Limit 1-2 tablets by mouth per day if tolerated -     diazepam (VALIUM) 5 MG tablet; Limit  1/2  tablet by mouth per day or every other day if tolerated     ----------------------------------------------------------------------------------------------------------------------  Problem List Items Addressed This Visit      Musculoskeletal and Integument   DDD (degenerative disc disease), lumbar - Primary   Relevant Medications   triamcinolone acetonide (KENALOG-40) injection 40 mg (Completed)   HYDROcodone-acetaminophen (NORCO/VICODIN) 5-325 MG tablet   Other Relevant Orders   Lumbar Epidural Injection   Facet syndrome, lumbar (HCC)   Relevant Medications   triamcinolone acetonide (KENALOG-40) injection 40 mg (Completed)    HYDROcodone-acetaminophen (NORCO/VICODIN) 5-325 MG tablet     Other   Fibromyalgia    Other Visit Diagnoses    Sciatica of left side       Relevant Medications   triamcinolone acetonide (KENALOG-40) injection 40 mg (Completed)   sodium chloride flush (NS) 0.9 % injection 10 mL (Completed)   ropivacaine (PF) 2 mg/mL (0.2%) (NAROPIN) injection 10 mL (Completed)   midazolam (VERSED) 5 MG/5ML injection 5 mg   lidocaine (PF) (XYLOCAINE) 1 % injection 5 mL (Completed)   lactated ringers infusion 1,000 mL   iopamidol (ISOVUE-M) 41 % intrathecal injection 20 mL   diazepam (VALIUM) 5 MG tablet  Other Relevant Orders   Lumbar Epidural Injection   Acute right-sided low back pain with right-sided sciatica       Relevant Medications   triamcinolone acetonide (KENALOG-40) injection 40 mg (Completed)   HYDROcodone-acetaminophen (NORCO/VICODIN) 5-325 MG tablet      ----------------------------------------------------------------------------------------------------------------------  1. Fibromyalgia Refills for her medications were given today.She is doing well with the Valium at nighttime. Seems to help with muscle relaxation and sleep with minimal side effects. Appears to be well tolerated and this will be refilled today. She is to return to clinic in 2 months for reevaluation and we have reviewed the [place] Affected [place] and It Is Appropriate.  2. DDD (degenerative disc disease), lumbar We will proceed with her second lumbar epidural steroid injection today. We have gone over the risks and benefits of the procedure once again and all questions have been answered. She is to return to clinic in 2 months for possible repeat injection for her sciatica.  3. Sacroiliac joint disease   4. Primary osteoarthritis of both shoulders  5. Primary osteoarthritis of both hands   6. Facet syndrome, lumbar continue with core stretching strengthening exercises   7. Cervical facet  joint syndrome  8.  Restless leg syndrome.     ----------------------------------------------------------------------------------------------------------------------  I am having Ms. Uselman maintain her esomeprazole, losartan, pravastatin, citalopram, Diclofenac-Misoprostol, levothyroxine, traMADol, aspirin EC, RESTASIS, HYDROcodone-acetaminophen, and diazepam. We administered triamcinolone acetonide, sodium chloride flush, ropivacaine (PF) 2 mg/mL (0.2%), lidocaine (PF), lactated ringers, and iopamidol.   Meds ordered this encounter  Medications  . triamcinolone acetonide (KENALOG-40) injection 40 mg  . sodium chloride flush (NS) 0.9 % injection 10 mL  . ropivacaine (PF) 2 mg/mL (0.2%) (NAROPIN) injection 10 mL  . midazolam (VERSED) 5 MG/5ML injection 5 mg  . lidocaine (PF) (XYLOCAINE) 1 % injection 5 mL  . lactated ringers infusion 1,000 mL  . iopamidol (ISOVUE-M) 41 % intrathecal injection 20 mL  . HYDROcodone-acetaminophen (NORCO/VICODIN) 5-325 MG tablet    Sig: Take 1 tablet by mouth 2 (two) times daily at 10 AM and 5 PM. Limit 1-2 tablets by mouth per day if tolerated    Dispense:  30 tablet    Refill:  0    Do not fill until [id number]  . diazepam (VALIUM) 5 MG tablet    Sig: Limit  1/2  tablet by mouth per day or every other day if tolerated    Dispense:  15 tablet    Refill:  1    Procedure: L5-S1 lumbar epidural steroid under fluoroscopic guidance with moderate sedation   Procedure: L5-S1 LESI with fluoroscopic guidance and moderate sedation  NOTE: The risks, benefits, and expectations of the procedure have been discussed and explained to the patient who was understanding and in agreement with suggested treatment plan. No guarantees were made.  DESCRIPTION OF PROCEDURE: Lumbar epidural steroid injection with IV Versed, EKG, blood pressure, pulse, and pulse oximetry monitoring. The procedure was performed with the patient in the prone position under fluoroscopic  guidance. I injected subcutaneous lidocaine overlying the L5-S1 site after its fluoroscopic identifictation.  Using strict aseptic technique, I then advanced an 18-gauge Tuohy epidural needle in the midline using interlaminar approach via loss-of-resistance to saline technique. There was negative aspiration for heme or  CSF.  I then confirmed position with both AP and Lateral fluoroscan. 2 cc of Isovue were injected and a  total of 5 mL of Preservative-Free normal saline mixed with 40 mg of Kenalog and 1cc Ropicaine 0.2  percent were injected incrementally via the  epidurally placed needle. The needle was removed. The patient tolerated the injection well and was convalesced and discharged to home in stable condition. Should the patient have any post procedure difficulty they have been instructed on how to contact us for assistance.     Follow-up: Return for evaluation, procedure.    Molli Barrows, MD   The [facility] practitioner database for opioids was reviewed today and her findings were appropriate.  This dictation was performed utilizing Systems analyst.  Please excuse any unintentional or mistaken typographical errors as a result of its unedited utilization.

## 2016-11-18 NOTE — Telephone Encounter (Signed)
No answer, left message  To call if needed.

## 2016-11-19 ENCOUNTER — Ambulatory Visit: Payer: Medicare Other | Admitting: Urology

## 2016-11-22 ENCOUNTER — Ambulatory Visit: Payer: Medicare Other | Attending: Anesthesiology

## 2016-11-22 ENCOUNTER — Telehealth: Payer: Self-pay | Admitting: Obstetrics & Gynecology

## 2016-11-22 NOTE — Telephone Encounter (Signed)
Called and left voice mail for patient to call back to be schedule °

## 2016-11-22 NOTE — Progress Notes (Unsigned)
Nursing Pain Medication Assessment:  Safety precautions to be maintained throughout the outpatient stay will include: orient to surroundings, keep bed in low position, maintain call bell within reach at all times, provide assistance with transfer out of bed and ambulation.  Medication Inspection Compliance: Pill count conducted under aseptic conditions, in front of the patient. Neither the pills nor the bottle was removed from the patient's sight at any time. Once count was completed pills were immediately returned to the patient in their original bottle.  Medication #1: Hydrocodone/APAP Pill/Patch Count: 0 of 30 pills remain Pill/Patch Appearance: Markings consistent with prescribed medication Bottle Appearance: Standard pharmacy container. Clearly labeled. Filled Date:08/07/ 2018 Last Medication intake:  Ran out of medicine more than 48 hours ago  Medication #2: Tramadol (Ultram) Pill/Patch Count: 14 of 90 pills remain Pill/Patch Appearance: Markings consistent with prescribed medication Bottle Appearance: Standard pharmacy container. Clearly labeled. Filled Date:08/07/ 2018 Last Medication intake:  Yesterday   Nurse visit for narcotic pill count and to receive prescription. Prescriptions for Hydrocodone and Valium given.

## 2016-11-22 NOTE — Telephone Encounter (Signed)
-----   Message from [place], LPN sent at 0/09/1217  3:51 PM EDT ----- Regarding: FW: pessary Both Pessaries requested are now in stock. Clarise Cruz please schedule pt for pessary placement apt.  Thanks! ----- Message ----- From: Gae Dry, MD Sent: 10/29/2016  12:38 PM To: Otila Kluver, LPN Subject: pessary                                        Plz order ring w support #3  And 4 to have in stock and will use one of these for this pt.  Plan to call patient once arrives for placement

## 2016-11-23 NOTE — Telephone Encounter (Signed)
Pt is schedule 9/21/18with [facility]PH

## 2016-11-30 ENCOUNTER — Ambulatory Visit: Payer: Medicare Other | Attending: Neurology

## 2016-11-30 DIAGNOSIS — F5101 Primary insomnia: Secondary | ICD-10-CM | POA: Diagnosis not present

## 2016-11-30 DIAGNOSIS — G4733 Obstructive sleep apnea (adult) (pediatric): Secondary | ICD-10-CM | POA: Insufficient documentation

## 2016-12-03 ENCOUNTER — Ambulatory Visit: Payer: Medicare Other | Admitting: Obstetrics & Gynecology

## 2016-12-06 ENCOUNTER — Encounter: Payer: Self-pay | Admitting: Obstetrics & Gynecology

## 2016-12-06 ENCOUNTER — Ambulatory Visit (INDEPENDENT_AMBULATORY_CARE_PROVIDER_SITE_OTHER): Payer: Medicare Other | Admitting: Obstetrics & Gynecology

## 2016-12-06 VITALS — BP 120/80 | HR 87 | Ht 68.0 in | Wt 183.0 lb

## 2016-12-06 DIAGNOSIS — N8111 Cystocele, midline: Secondary | ICD-10-CM | POA: Diagnosis not present

## 2016-12-06 DIAGNOSIS — R32 Unspecified urinary incontinence: Secondary | ICD-10-CM | POA: Diagnosis not present

## 2016-12-06 NOTE — Progress Notes (Signed)
  HPI:      [name] is a 71 y.o. No obstetric history on file. who presents today for her pessary follow up and examination related to her pelvic floor weakening.  Pt reports continued sx's.  Pessary #3 arrived and ready for placement.  PMHx: She  has a past medical history of Allergy; Anxiety; Arthritis; Bilateral dry eyes; Dental crowns present; Fibromyalgia; GERD (gastroesophageal reflux disease); Hyperlipidemia; Hypertension; Kidney stones; Myocardial infarction ([place]) (04/22/2006); Osteoarthritis; Rheumatoid arthritis ([place]); and Thyroid disease. Also,  has a past surgical history that includes Appendectomy; Cesarean section; Abdominal hysterectomy; Bunionectomy (Bilateral); dental implant; Colonoscopy (2010); Coronary angioplasty with stent (04/2006); Shoulder arthroscopy (Right, 05/07/2015); Breast biopsy (Right); Breast cyst aspiration (Left); and epidural steroid injection (2018)., family history includes Diabetes in her sister; Heart disease in her father; Pancreatic cancer in her sister; Prostate cancer in her brother; Stroke in her mother.,  reports that she has quit smoking. Her smoking use included Cigarettes. She quit smokeless tobacco use about 10 years ago. She reports that she does not drink alcohol or use drugs.  She has a current medication list which includes the following prescription(s): aspirin ec, citalopram, diazepam, diclofenac-misoprostol, esomeprazole, hydrocodone-acetaminophen, levothyroxine, losartan, pravastatin, restasis, and tramadol. Also, is allergic to augmentin [amoxicillin-pot clavulanate]; codeine; sulfa antibiotics; and shellfish allergy.  Review of Systems  All other systems reviewed and are negative.  Objective: BP 120/80   Pulse 87   Ht 5\' 8"  (1.727 m)   Wt 183 lb (83 kg)   BMI 27.83 kg/m  Physical Exam  Constitutional: She is oriented to person, place, and time. She appears well-developed and well-nourished. No distress.  Genitourinary: Vagina  normal. Pelvic exam was performed with patient supine. There is no rash, tenderness or lesion on the right labia. There is no rash, tenderness or lesion on the left labia. No erythema or bleeding in the vagina.  Genitourinary Comments: Cuff intact/ no lesions Gr 1 cystocele Absent uterus and cervix  Abdominal: Soft. She exhibits no distension. There is no tenderness.  Musculoskeletal: Normal range of motion.  Neurological: She is alert and oriented to person, place, and time. No cranial nerve deficit.  Skin: Skin is warm and dry.  Psychiatric: She has a normal mood and affect.   A/P: Pessary was placed today with good fit and no expulsion in room. Instructions given for care. Concerning symptoms to observe for are counseled to patient. Follow up scheduled for 1  months.  Barnett Applebaum, MD, [facility], [facility] 12/06/2016  2:21 PM

## 2016-12-22 ENCOUNTER — Encounter: Payer: Self-pay | Admitting: Nurse Practitioner

## 2016-12-22 ENCOUNTER — Ambulatory Visit: Payer: Medicare Other | Attending: Nurse Practitioner | Admitting: Nurse Practitioner

## 2016-12-22 VITALS — BP 121/70 | HR 85 | Temp 97.9°F | Resp 18 | Ht 68.0 in | Wt 183.0 lb

## 2016-12-22 DIAGNOSIS — Z880 Allergy status to penicillin: Secondary | ICD-10-CM | POA: Insufficient documentation

## 2016-12-22 DIAGNOSIS — I10 Essential (primary) hypertension: Secondary | ICD-10-CM | POA: Diagnosis not present

## 2016-12-22 DIAGNOSIS — F419 Anxiety disorder, unspecified: Secondary | ICD-10-CM | POA: Insufficient documentation

## 2016-12-22 DIAGNOSIS — M5136 Other intervertebral disc degeneration, lumbar region: Secondary | ICD-10-CM | POA: Insufficient documentation

## 2016-12-22 DIAGNOSIS — K219 Gastro-esophageal reflux disease without esophagitis: Secondary | ICD-10-CM | POA: Diagnosis not present

## 2016-12-22 DIAGNOSIS — I219 Acute myocardial infarction, unspecified: Secondary | ICD-10-CM | POA: Diagnosis not present

## 2016-12-22 DIAGNOSIS — Z9889 Other specified postprocedural states: Secondary | ICD-10-CM | POA: Insufficient documentation

## 2016-12-22 DIAGNOSIS — M189 Osteoarthritis of first carpometacarpal joint, unspecified: Secondary | ICD-10-CM | POA: Diagnosis not present

## 2016-12-22 DIAGNOSIS — G8929 Other chronic pain: Secondary | ICD-10-CM | POA: Diagnosis not present

## 2016-12-22 DIAGNOSIS — G894 Chronic pain syndrome: Secondary | ICD-10-CM | POA: Insufficient documentation

## 2016-12-22 DIAGNOSIS — I252 Old myocardial infarction: Secondary | ICD-10-CM | POA: Insufficient documentation

## 2016-12-22 DIAGNOSIS — M159 Polyosteoarthritis, unspecified: Secondary | ICD-10-CM

## 2016-12-22 DIAGNOSIS — M545 Low back pain, unspecified: Secondary | ICD-10-CM

## 2016-12-22 DIAGNOSIS — M797 Fibromyalgia: Secondary | ICD-10-CM | POA: Diagnosis not present

## 2016-12-22 DIAGNOSIS — M47816 Spondylosis without myelopathy or radiculopathy, lumbar region: Secondary | ICD-10-CM

## 2016-12-22 DIAGNOSIS — M19011 Primary osteoarthritis, right shoulder: Secondary | ICD-10-CM | POA: Diagnosis not present

## 2016-12-22 DIAGNOSIS — Z7982 Long term (current) use of aspirin: Secondary | ICD-10-CM | POA: Diagnosis not present

## 2016-12-22 DIAGNOSIS — Z823 Family history of stroke: Secondary | ICD-10-CM | POA: Insufficient documentation

## 2016-12-22 DIAGNOSIS — Z90710 Acquired absence of both cervix and uterus: Secondary | ICD-10-CM | POA: Insufficient documentation

## 2016-12-22 DIAGNOSIS — Z833 Family history of diabetes mellitus: Secondary | ICD-10-CM | POA: Diagnosis not present

## 2016-12-22 DIAGNOSIS — M069 Rheumatoid arthritis, unspecified: Secondary | ICD-10-CM | POA: Diagnosis not present

## 2016-12-22 DIAGNOSIS — Z79899 Other long term (current) drug therapy: Secondary | ICD-10-CM | POA: Insufficient documentation

## 2016-12-22 DIAGNOSIS — Z87891 Personal history of nicotine dependence: Secondary | ICD-10-CM | POA: Diagnosis not present

## 2016-12-22 DIAGNOSIS — Z87442 Personal history of urinary calculi: Secondary | ICD-10-CM | POA: Insufficient documentation

## 2016-12-22 DIAGNOSIS — Z885 Allergy status to narcotic agent status: Secondary | ICD-10-CM | POA: Diagnosis not present

## 2016-12-22 DIAGNOSIS — Z5181 Encounter for therapeutic drug level monitoring: Secondary | ICD-10-CM | POA: Insufficient documentation

## 2016-12-22 DIAGNOSIS — Z79891 Long term (current) use of opiate analgesic: Secondary | ICD-10-CM | POA: Insufficient documentation

## 2016-12-22 DIAGNOSIS — M19012 Primary osteoarthritis, left shoulder: Secondary | ICD-10-CM | POA: Diagnosis not present

## 2016-12-22 DIAGNOSIS — Z8249 Family history of ischemic heart disease and other diseases of the circulatory system: Secondary | ICD-10-CM | POA: Diagnosis not present

## 2016-12-22 MED ORDER — HYDROCODONE-ACETAMINOPHEN 5-325 MG PO TABS: 1.0000 | ORAL_TABLET | Freq: Two times a day (BID) | ORAL | 0 refills | Status: DC

## 2016-12-22 NOTE — Patient Instructions (Signed)

## 2016-12-22 NOTE — Progress Notes (Signed)
Patient's Name: Carrie Myers  MRN: [id number]  Referring Provider: [name], MD  DOB: Oct 24, 1945  PCP: [name], MD  DOS: 12/22/2016  Note by: Vevelyn Francois NP  Service setting: Ambulatory outpatient  Specialty: Interventional Pain Management  Location: [facility] (AMB) Pain Management Facility    Patient type: Established    Primary Reason(s) for Visit: Encounter for prescription drug management & post-procedure evaluation of chronic illness with mild to moderate exacerbation(Level of risk: moderate) CC: Back Pain (low)  HPI  Carrie Myers is a 71 y.o. year old, female patient, who comes today for a post-procedure evaluation and medication management. She has Fibromyalgia; Intercostal neuralgia; DDD (degenerative disc disease), lumbar; Facet syndrome, lumbar; Sacroiliac joint disease; DJD of shoulder; Benign essential HTN; Carotid artery narrowing; Arteriosclerosis of coronary artery; Mixed hyperlipidemia; Arthritis of hand, degenerative; Fracture of right foot; Fibrocystic breast; Gastroesophageal reflux disease without esophagitis; Hypothyroidism, postablative; Impingement syndrome of right shoulder; Other shoulder lesions, right shoulder; Status post shoulder surgery; Cystocele, midline; Urinary incontinence; Generalized osteoarthritis; Osteoarthritis of thumb; Chronic pain syndrome; and Chronic low back pain on her problem list. Her primarily concern today is the Back Pain (low)  Pain Assessment: Location: Lower Back Radiating: radiates into left hip at times Onset: More than a month ago Duration: Chronic pain Quality: Stabbing, Burning Severity: 5 /10 (self-reported pain score)  Note: Reported level is compatible with observation.                    Effect on ADL:   Timing: Constant Modifying factors: rest, medications  Carrie Myers was last seen on Visit date not found for a procedure. During today's appointment we reviewed Carrie Myers's post-procedure  results, as well as her outpatient medication regimen. She has constant back and hip pain. She admits that she has overall pain in fibromyalgia, and OA. She states that she feels like will always be in pain. She denies any numbness, tingling or weakness in any area. She denies any side effects of her medications. She feels like her pain is stable.   Further details on both, my assessment(s), as well as the proposed treatment plan, please see below.  Controlled Substance Pharmacotherapy Assessment REMS (Risk Evaluation and Mitigation Strategy)  Analgesic: Hydrocodone/APAP 5/394m 1-2 tabs daily prn + Tramadol 50 mg TID prn  MME/day: 30 mg/day.  TDewayne Shorter RN  12/22/2016  1:50 PM  Signed Nursing Pain Medication Assessment:  Safety precautions to be maintained throughout the outpatient stay will include: orient to surroundings, keep bed in low position, maintain call bell within reach at all times, provide assistance with transfer out of bed and ambulation.  Medication Inspection Compliance: Pill count conducted under aseptic conditions, in front of the patient. Neither the pills nor the bottle was removed from the patient's sight at any time. Once count was completed pills were immediately returned to the patient in their original bottle.  Medication #1: Hydrocodone/APAP Pill/Patch Count: 0 of 30 pills remain Pill/Patch Appearance: Markings consistent with prescribed medication Bottle Appearance: Standard pharmacy container. Clearly labeled. Filled Date: 09 / 10 / 2018 Last Medication intake:  Yesterday  Medication #2: Tramadol (Ultram) Pill/Patch Count: 0 of 90 pills remain Pill/Patch Appearance: Markings consistent with prescribed medication Bottle Appearance: Standard pharmacy container. Clearly labeled. Filled Date: 09 / 10 / 2018 Last Medication intake:  Today   Pharmacokinetics: Liberation and absorption (onset of action): WNL Distribution (time to peak effect): WNL Metabolism  and excretion (duration of action): WNL  Pharmacodynamics: Desired effects: Analgesia: Carrie Myers reports >50% benefit. Functional ability: Patient reports that medication allows her to accomplish basic ADLs Clinically meaningful improvement in function (CMIF): Sustained CMIF goals met Perceived effectiveness: Described as relatively effective, allowing for increase in activities of daily living (ADL) Undesirable effects: Side-effects or Adverse reactions: None reported Monitoring: [place] PMP: Online review of the past 98-monthperiod conducted. Compliant with practice rules and regulations Last UDS on record: No results found for: SUMMARY UDS interpretation: Compliant          Medication Assessment Form: Reviewed. Patient indicates being compliant with therapy Treatment compliance: Compliant Risk Assessment Profile: Aberrant behavior: See prior evaluations. None observed or detected today Comorbid factors increasing risk of overdose: See prior notes. No additional risks detected today Risk of substance use disorder (SUD): Low     Opioid Risk Tool - 11/17/16 1404      Family History of Substance Abuse   Alcohol Positive Female   Illegal Drugs Positive Female   Rx Drugs Negative     Personal History of Substance Abuse   Alcohol Negative   Illegal Drugs Negative   Rx Drugs Negative     Age   Age between 169-45years  No     History of Preadolescent Sexual Abuse   History of Preadolescent Sexual Abuse Negative or Female     Psychological Disease   Psychological Disease Negative   Depression Negative     Total Score   Opioid Risk Tool Scoring 3   Opioid Risk Interpretation Low Risk     ORT Scoring interpretation table:  Score <3 = Low Risk for SUD  Score between 4-7 = Moderate Risk for SUD  Score >8 = High Risk for Opioid Abuse   Risk Mitigation Strategies:  Patient Counseling: Covered Patient-Prescriber Agreement (PPA): Present and active  Notification to  other healthcare providers: Done  Pharmacologic Plan: No change in therapy, at this time  Post-Procedure Assessment  Visit date not found Procedure: 11/17/16 Lumbar ESI Pre-procedure pain score:        /10  Post-procedure pain score: 0/10         Influential Factors: BMI: 27.83 kg/m Intra-procedural challenges: None observed.         Assessment challenges: None detected.              Reported side-effects: None.        Post-procedural adverse reactions or complications: None reported         Sedation: Please see nurses note. When no sedatives are used, the analgesic levels obtained are directly associated to the effectiveness of the local anesthetics. However, when sedation is provided, the level of analgesia obtained during the initial 1 hour following the intervention, is believed to be the result of a combination of factors. These factors may include, but are not limited to: 1. The effectiveness of the local anesthetics used. 2. The effects of the analgesic(s) and/or anxiolytic(s) used. 3. The degree of discomfort experienced by the patient at the time of the procedure. 4. The patients ability and reliability in recalling and recording the events. 5. The presence and influence of possible secondary gains and/or psychosocial factors. Reported result: Relief experienced during the 1st hour after the procedure: 100 % (Ultra-Short Term Relief)            Interpretative annotation: Clinically appropriate result. Analgesia during this period is likely to be Local Anesthetic and/or IV Sedative (Analgesic/Anxiolytic) related.  Effects of local anesthetic: The analgesic effects attained during this period are directly associated to the localized infiltration of local anesthetics and therefore cary significant diagnostic value as to the etiological location, or anatomical origin, of the pain. Expected duration of relief is directly dependent on the pharmacodynamics of the local anesthetic  used. Long-acting (4-6 hours) anesthetics used.  Reported result: Relief during the next 4 to 6 hour after the procedure: 50 % (Short-Term Relief)            Interpretative annotation: Clinically appropriate result. Analgesia during this period is likely to be Local Anesthetic-related.          Long-term benefit: Defined as the period of time past the expected duration of local anesthetics (1 hour for short-acting and 4-6 hours for long-acting). With the possible exception of prolonged sympathetic blockade from the local anesthetics, benefits during this period are typically attributed to, or associated with, other factors such as analgesic sensory neuropraxia, antiinflammatory effects, or beneficial biochemical changes provided by agents other than the local anesthetics.  Reported result: Extended relief following procedure:  (50) (Long-Term Relief)            Interpretative annotation: Clinically appropriate result. Good relief. No permanent benefit expected. Inflammation plays a part in the etiology to the pain.          Current benefits: Defined as reported results that persistent at this point in time.   Analgesia: 25-50 %            Function: Somewhat improved ROM: Somewhat improved Interpretative annotation: Recurrence of symptoms. No permanent benefit expected. Effective diagnostic intervention. Benefit could be steroid-related.  Interpretation: Results would suggest that repeating the procedure may be necessary,                  Plan:  Please see "Plan of Care" for details.       Laboratory Chemistry  Inflammation Markers (CRP: Acute Phase) (ESR: Chronic Phase) No results found for: CRP, ESRSEDRATE               Renal Function Markers Lab Results  Component Value Date   BUN 15 03/16/2016   CREATININE 0.86 03/16/2016   GFRAA 80 03/16/2016   GFRNONAA 70 03/16/2016                 Hepatic Function Markers Lab Results  Component Value Date   AST 27 03/16/2016   ALT 30  03/16/2016   ALBUMIN 4.4 03/16/2016   ALKPHOS 114 03/16/2016                 Electrolytes Lab Results  Component Value Date   NA 141 03/16/2016   K 4.6 03/16/2016   CL 99 03/16/2016   CALCIUM 9.2 03/16/2016                 Neuropathy Markers No results found for: [id number]               Bone Pathology Markers Lab Results  Component Value Date   ALKPHOS 114 03/16/2016   CALCIUM 9.2 03/16/2016                 Coagulation Parameters Lab Results  Component Value Date   PLT 215 03/16/2016                 Cardiovascular Markers Lab Results  Component Value Date   HGB 13.0 03/16/2016   HCT 39.4 03/16/2016  Note: Lab results reviewed.  Recent Diagnostic Imaging Results  DG C-Arm 1-60 Min-No Report Fluoroscopy was utilized by the requesting physician.  No radiographic  interpretation.   Complexity Note: Imaging results reviewed. Results shared with Ms. Labarbera, using Layman's terms.                         Meds   Current Outpatient Prescriptions:  .  aspirin EC 81 MG tablet, Take 81 mg by mouth daily., Disp: , Rfl:  .  chlorhexidine (PERIDEX) 0.12 % solution, RINSE 1/2 OUNCE BID FOR 30 SECONDS, Disp: , Rfl: 0 .  citalopram (CELEXA) 20 MG tablet, TAKE 1 TABLET BY MOUTH  DAILY, Disp: 90 tablet, Rfl: 3 .  diazepam (VALIUM) 5 MG tablet, Limit  1/2  tablet by mouth per day or every other day if tolerated, Disp: 15 tablet, Rfl: 1 .  Diclofenac-Misoprostol 75-0.2 MG TBEC, TAKE 1 TABLET BY MOUTH TWO  TIMES DAILY, Disp: 180 tablet, Rfl: 3 .  esomeprazole (NEXIUM) 20 MG capsule, Take 40 mg by mouth daily. Over the Counter, Disp: , Rfl:  .  [START ON 01/21/2017] HYDROcodone-acetaminophen (NORCO/VICODIN) 5-325 MG tablet, Take 1 tablet by mouth 2 (two) times daily at 10 AM and 5 PM. Limit 1-2 tablets by mouth per day if tolerated, Disp: 30 tablet, Rfl: 0 .  levothyroxine (SYNTHROID, LEVOTHROID) 88 MCG tablet, TAKE 1 TABLET BY MOUTH  DAILY, Disp: 90 tablet, Rfl:  3 .  LORazepam (ATIVAN) 2 MG tablet, TK 1 T PO 2 HOURS PRIOR TO APPOINTMENT, Disp: , Rfl: 0 .  losartan (COZAAR) 50 MG tablet, Take 50 mg by mouth daily., Disp: , Rfl:  .  pravastatin (PRAVACHOL) 80 MG tablet, Take 80 mg by mouth daily., Disp: , Rfl:  .  RESTASIS 0.05 % ophthalmic emulsion, Take 1 drop by mouth 2 (two) times daily., Disp: , Rfl: 12 .  traMADol (ULTRAM) 50 MG tablet, Take 1 tablet (50 mg total) by mouth every 6 (six) hours as needed., Disp: 90 tablet, Rfl: 5  ROS  Constitutional: Denies any fever or chills Gastrointestinal: No reported hemesis, hematochezia, vomiting, or acute GI distress Musculoskeletal: Denies any acute onset joint swelling, redness, loss of ROM, or weakness Neurological: No reported episodes of acute onset apraxia, aphasia, dysarthria, agnosia, amnesia, paralysis, loss of coordination, or loss of consciousness  Allergies  Ms. Hartsough is allergic to augmentin [amoxicillin-pot clavulanate]; codeine; sulfa antibiotics; and shellfish allergy.  [place]  Drug: Ms. Telford  reports that she does not use drugs. Alcohol:  reports that she does not drink alcohol. Tobacco:  reports that she has quit smoking. Her smoking use included Cigarettes. She quit smokeless tobacco use about 10 years ago. Medical:  has a past medical history of Allergy; Anxiety; Arthritis; Bilateral dry eyes; Dental crowns present; Fibromyalgia; GERD (gastroesophageal reflux disease); Hyperlipidemia; Hypertension; Kidney stones; Myocardial infarction ([place]) (04/22/2006); Osteoarthritis; Rheumatoid arthritis ([place]); and Thyroid disease. Surgical: Ms. Lichtenwalner  has a past surgical history that includes Appendectomy; Cesarean section; Abdominal hysterectomy; Bunionectomy (Bilateral); dental implant; Colonoscopy (2010); Coronary angioplasty with stent (04/2006); Shoulder arthroscopy (Right, 05/07/2015); Breast biopsy (Right); Breast cyst aspiration (Left); and epidural steroid injection (2018). Family:  family history includes Diabetes in her sister; Heart disease in her father; Pancreatic cancer in her sister; Prostate cancer in her brother; Stroke in her mother.  Constitutional Exam  General appearance: Well nourished, well developed, and well hydrated. In no apparent acute distress Vitals:   12/22/16 1334  BP: 121/70  Pulse: 85  Resp: 18  Temp: 97.9 F (36.6 C)  SpO2: 96%  Weight: 183 lb (83 kg)  Height: 5' 8"  (1.727 m)   BMI Assessment: Estimated body mass index is 27.83 kg/m as calculated from the following:   Height as of this encounter: 5' 8"  (1.727 m).   Weight as of this encounter: 183 lb (83 kg).  Psych/Mental status: Alert, oriented x 3 (person, place, & time)       Eyes: PERLA Respiratory: No evidence of acute respiratory distress  Lumbar Spine Area Exam  Skin & Axial Inspection: No masses, redness, or swelling Alignment: Symmetrical Functional ROM: Unrestricted ROM      Stability: No instability detected Muscle Tone/Strength: Functionally intact. No obvious neuro-muscular anomalies detected. Sensory (Neurological): Unimpaired Palpation: Complains of area being tender to palpation       Provocative Tests: Lumbar Hyperextension and rotation test: Positive bilaterally for facet joint pain. Lumbar Lateral bending test: evaluation deferred today       Patrick's Maneuver: evaluation deferred today                    Gait & Posture Assessment  Ambulation: Unassisted Gait: Relatively normal for age and body habitus Posture: WNL   Lower Extremity Exam    Side: Right lower extremity  Side: Left lower extremity  Skin & Extremity Inspection: Skin color, temperature, and hair growth are WNL. No peripheral edema or cyanosis. No masses, redness, swelling, asymmetry, or associated skin lesions. No contractures.  Skin & Extremity Inspection: Skin color, temperature, and hair growth are WNL. No peripheral edema or cyanosis. No masses, redness, swelling, asymmetry, or  associated skin lesions. No contractures.  Functional ROM: Unrestricted ROM          Functional ROM: Unrestricted ROM          Muscle Tone/Strength: Able to Toe-walk & Heel-walk without problems  Muscle Tone/Strength: Able to Toe-walk & Heel-walk without problems  Sensory (Neurological): Unimpaired  Sensory (Neurological): Unimpaired  Palpation: No palpable anomalies  Palpation: No palpable anomalies   Assessment  Primary Diagnosis & Pertinent Problem List: The primary encounter diagnosis was Chronic bilateral low back pain without sciatica. Diagnoses of Generalized osteoarthritis, Chronic pain syndrome, DDD (degenerative disc disease), lumbar, Primary osteoarthritis of both shoulders, and Facet syndrome, lumbar were also pertinent to this visit.  Status Diagnosis  Controlled Controlled Controlled 1. Chronic bilateral low back pain without sciatica   2. Generalized osteoarthritis   3. Chronic pain syndrome   4. DDD (degenerative disc disease), lumbar   5. Primary osteoarthritis of both shoulders   6. Facet syndrome, lumbar     Problems updated and reviewed during this visit: Problem  Chronic Pain Syndrome  Chronic Low Back Pain  Generalized Osteoarthritis  Osteoarthritis of Thumb   Plan of Care  Pharmacotherapy (Medications Ordered): Meds ordered this encounter  Medications  . HYDROcodone-acetaminophen (NORCO/VICODIN) 5-325 MG tablet    Sig: Take 1 tablet by mouth 2 (two) times daily at 10 AM and 5 PM. Limit 1-2 tablets by mouth per day if tolerated    Dispense:  30 tablet    Refill:  0    May refill today    Order Specific Question:   Supervising Provider    Answer:   Milinda Pointer [phone]  . HYDROcodone-acetaminophen (NORCO/VICODIN) 5-325 MG tablet    Sig: Take 1 tablet by mouth 2 (two) times daily at 10 AM and 5 PM. Limit 1-2 tablets by mouth  per day if tolerated    Dispense:  30 tablet    Refill:  0    Do Not Fill Until 01/21/17    Order Specific Question:    Supervising Provider    Answer:   Milinda Pointer [phone]   New Prescriptions   No medications on file   Medications administered today: Ms. Meikle had no medications administered during this visit. Lab-work, procedure(s), and/or referral(s): No orders of the defined types were placed in this encounter.  Imaging and/or referral(s): None  Interventional therapies: Planned, scheduled, and/or pending:   Not at this time.   Provider-requested follow-up: Return in about 2 months (around 02/21/2017) for MedMgmt.  Future Appointments Date Time Provider [place]  12/31/2016 11:00 AM Gae Dry, MD [facility] None  02/21/2017 12:45 PM Vevelyn Francois, NP [facility] None  03/18/2017 9:30 AM [name], MD [facility] None   Primary Care Physician: [name], MD Location: [facility] Out[facility] Management Facility Note by: Vevelyn Francois NP Date: 12/22/2016; Time: 8:26 AM  Pain Score Disclaimer: We use the NRS-11 scale. This is a self-reported, subjective measurement of pain severity with only modest accuracy. It is used primarily to identify changes within a particular patient. It must be understood that outpatient pain scales are significantly less accurate that those used for research, where they can be applied under ideal controlled circumstances with minimal exposure to variables. In reality, the score is likely to be a combination of pain intensity and pain affect, where pain affect describes the degree of emotional arousal or changes in action readiness caused by the sensory experience of pain. Factors such as social and work situation, setting, emotional state, anxiety levels, expectation, and prior pain experience may influence pain perception and show large inter-individual differences that may also be affected by time variables.  Patient instructions provided during this appointment: Patient Instructions    ____________________________________________________________________________________________  Medication Rules  Applies to: All patients receiving prescriptions (written or electronic).  Pharmacy of record: Pharmacy where electronic prescriptions will be sent. If written prescriptions are taken to a different pharmacy, please inform the nursing staff. The pharmacy listed in the electronic medical record should be the one where you would like electronic prescriptions to be sent.  Prescription refills: Only during scheduled appointments. Applies to both, written and electronic prescriptions.  NOTE: The following applies primarily to controlled substances (Opioid* Pain Medications).   Patient's responsibilities: 1. Pain Pills: Bring all pain pills to every appointment (except for procedure appointments). 2. Pill Bottles: Bring pills in original pharmacy bottle. Always bring newest bottle. Bring bottle, even if empty. 3. Medication refills: You are responsible for knowing and keeping track of what medications you need refilled. The day before your appointment, write a list of all prescriptions that need to be refilled. Bring that list to your appointment and give it to the admitting nurse. Prescriptions will be written only during appointments. If you forget a medication, it will not be "Called in", "Faxed", or "electronically sent". You will need to get another appointment to get these prescribed. 4. Prescription Accuracy: You are responsible for carefully inspecting your prescriptions before leaving our office. Have the discharge nurse carefully go over each prescription with you, before taking them home. Make sure that your name is accurately spelled, that your address is correct. Check the name and dose of your medication to make sure it is accurate. Check the number of pills, and the written instructions to make sure they are clear and  accurate. Make sure that you are given enough medication to  last until your next medication refill appointment. 5. Taking Medication: Take medication as prescribed. Never take more pills than instructed. Never take medication more frequently than prescribed. Taking less pills or less frequently is permitted and encouraged, when it comes to controlled substances (written prescriptions).  6. Inform other Doctors: Always inform, all of your healthcare providers, of all the medications you take. 7. Pain Medication from other Providers: You are not allowed to accept any additional pain medication from any other Doctor or Healthcare provider. There are two exceptions to this rule. (see below) In the event that you require additional pain medication, you are responsible for notifying us, as stated below. 8. Medication Agreement: You are responsible for carefully reading and following our Medication Agreement. This must be signed before receiving any prescriptions from our practice. Safely store a copy of your signed Agreement. Violations to the Agreement will result in no further prescriptions. (Additional copies of our Medication Agreement are available upon request.) 9. Laws, Rules, & Regulations: All patients are expected to follow all Federal and [employer], [employer], Rules, [employer]. Ignorance of the Laws does not constitute a valid excuse. The use of any illegal substances is prohibited. 10. Adopted CDC guidelines & recommendations: Target dosing levels will be at or below 60 MME/day. Use of benzodiazepines** is not recommended.  Exceptions: There are only two exceptions to the rule of not receiving pain medications from other Healthcare Providers. 1. Exception #1 (Emergencies): In the event of an emergency (i.e.: accident requiring emergency care), you are allowed to receive additional pain medication. However, you are responsible for: As soon as you are able, call our office [phone], at any time of the day or night, and leave a message stating your  name, the date and nature of the emergency, and the name and dose of the medication prescribed. In the event that your call is answered by a member of our staff, make sure to document and save the date, time, and the name of the person that took your information.  2. Exception #2 (Planned Surgery): In the event that you are scheduled by another doctor or dentist to have any type of surgery or procedure, you are allowed (for a period no longer than 30 days), to receive additional pain medication, for the acute post-op pain. However, in this case, you are responsible for picking up a copy of our "Post-op Pain Management for Surgeons" handout, and giving it to your surgeon or dentist. This document is available at our office, and does not require an appointment to obtain it. Simply go to our office during business hours (Monday-Thursday from 8:00 AM to 4:00 PM) (Friday 8:00 AM to 12:00 Noon) or if you have a scheduled appointment with [place], prior to your surgery, and ask for it by name. In addition, you will need to provide [place] with your name, name of your surgeon, type of surgery, and date of procedure or surgery.  *Opioid medications include: morphine, codeine, oxycodone, oxymorphone, hydrocodone, hydromorphone, meperidine, tramadol, tapentadol, buprenorphine, fentanyl, methadone. **Benzodiazepine medications include: diazepam (Valium), alprazolam (Xanax), clonazepam (Klonopine), lorazepam (Ativan), clorazepate (Tranxene), chlordiazepoxide (Librium), estazolam (Prosom), oxazepam (Serax), temazepam (Restoril), triazolam (Halcion)  ____________________________________________________________________________________________

## 2016-12-22 NOTE — Progress Notes (Signed)
Nursing Pain Medication Assessment:  Safety precautions to be maintained throughout the outpatient stay will include: orient to surroundings, keep bed in low position, maintain call bell within reach at all times, provide assistance with transfer out of bed and ambulation.  Medication Inspection Compliance: Pill count conducted under aseptic conditions, in front of the patient. Neither the pills nor the bottle was removed from the patient's sight at any time. Once count was completed pills were immediately returned to the patient in their original bottle.  Medication #1: Hydrocodone/APAP Pill/Patch Count: 0 of 30 pills remain Pill/Patch Appearance: Markings consistent with prescribed medication Bottle Appearance: Standard pharmacy container. Clearly labeled. Filled Date: 09 / 10 / 2018 Last Medication intake:  Yesterday  Medication #2: Tramadol (Ultram) Pill/Patch Count: 0 of 90 pills remain Pill/Patch Appearance: Markings consistent with prescribed medication Bottle Appearance: Standard pharmacy container. Clearly labeled. Filled Date: 09 / 10 / 2018 Last Medication intake:  Today

## 2016-12-23 DIAGNOSIS — M159 Polyosteoarthritis, unspecified: Secondary | ICD-10-CM | POA: Insufficient documentation

## 2016-12-23 DIAGNOSIS — M545 Low back pain, unspecified: Secondary | ICD-10-CM

## 2016-12-23 DIAGNOSIS — G8929 Other chronic pain: Secondary | ICD-10-CM | POA: Insufficient documentation

## 2016-12-23 DIAGNOSIS — G894 Chronic pain syndrome: Secondary | ICD-10-CM | POA: Insufficient documentation

## 2016-12-23 HISTORY — DX: Low back pain, unspecified: M54.50

## 2016-12-28 ENCOUNTER — Ambulatory Visit: Payer: Medicare Other | Attending: Neurology

## 2016-12-28 DIAGNOSIS — F5101 Primary insomnia: Secondary | ICD-10-CM | POA: Insufficient documentation

## 2016-12-28 DIAGNOSIS — G4733 Obstructive sleep apnea (adult) (pediatric): Secondary | ICD-10-CM | POA: Diagnosis present

## 2016-12-28 DIAGNOSIS — G4730 Sleep apnea, unspecified: Secondary | ICD-10-CM | POA: Diagnosis not present

## 2016-12-28 DIAGNOSIS — F5102 Adjustment insomnia: Secondary | ICD-10-CM | POA: Insufficient documentation

## 2016-12-29 ENCOUNTER — Other Ambulatory Visit: Payer: Self-pay | Admitting: Internal Medicine

## 2016-12-29 DIAGNOSIS — M797 Fibromyalgia: Secondary | ICD-10-CM

## 2016-12-31 ENCOUNTER — Encounter: Payer: Self-pay | Admitting: Internal Medicine

## 2016-12-31 ENCOUNTER — Encounter: Payer: Self-pay | Admitting: Obstetrics & Gynecology

## 2016-12-31 ENCOUNTER — Ambulatory Visit (INDEPENDENT_AMBULATORY_CARE_PROVIDER_SITE_OTHER): Payer: Medicare Other | Admitting: Internal Medicine

## 2016-12-31 ENCOUNTER — Ambulatory Visit (INDEPENDENT_AMBULATORY_CARE_PROVIDER_SITE_OTHER): Payer: Medicare Other | Admitting: Obstetrics & Gynecology

## 2016-12-31 ENCOUNTER — Ambulatory Visit: Payer: Medicare Other | Admitting: Obstetrics & Gynecology

## 2016-12-31 VITALS — BP 108/64 | HR 80 | Ht 68.0 in | Wt 185.0 lb

## 2016-12-31 VITALS — BP 120/80 | HR 87 | Ht 68.0 in | Wt 185.0 lb

## 2016-12-31 DIAGNOSIS — N8111 Cystocele, midline: Secondary | ICD-10-CM

## 2016-12-31 DIAGNOSIS — M5090 Cervical disc disorder, unspecified, unspecified cervical region: Secondary | ICD-10-CM | POA: Diagnosis not present

## 2016-12-31 DIAGNOSIS — R32 Unspecified urinary incontinence: Secondary | ICD-10-CM | POA: Diagnosis not present

## 2016-12-31 DIAGNOSIS — Z23 Encounter for immunization: Secondary | ICD-10-CM

## 2016-12-31 DIAGNOSIS — G4733 Obstructive sleep apnea (adult) (pediatric): Secondary | ICD-10-CM | POA: Diagnosis not present

## 2016-12-31 MED ORDER — CYCLOBENZAPRINE HCL 10 MG PO TABS: 10.0000 mg | ORAL_TABLET | Freq: Three times a day (TID) | ORAL | 0 refills | Status: DC | PRN

## 2016-12-31 MED ORDER — ZOSTER VAC RECOMB ADJUVANTED 50 MCG/0.5ML IM SUSR: 0.5000 mL | Freq: Once | INTRAMUSCULAR | 1 refills | Status: AC

## 2016-12-31 MED ORDER — MIRABEGRON ER 25 MG PO TB24: 25.0000 mg | ORAL_TABLET | Freq: Every day | ORAL | 11 refills | Status: DC

## 2016-12-31 NOTE — Patient Instructions (Signed)
Mirabegron extended-release tablets What is this medicine? MIRABEGRON (MIR a BEG ron) is used to treat overactive bladder. This medicine reduces the amount of bathroom visits. It may also help to control wetting accidents. This medicine may be used for other purposes; ask your health care provider or pharmacist if you have questions. COMMON BRAND NAME(S): Myrbetriq What should I tell my health care provider before I take this medicine? They need to know if you have any of these conditions: -difficulty passing urine -high blood pressure -kidney disease -liver disease -an unusual or allergic reaction to mirabegron, other medicines, foods, dyes, or preservatives -pregnant or trying to get pregnant -breast-feeding How should I use this medicine? Take this medicine by mouth with a glass of water. Follow the directions on the prescription label. Do not cut, crush or chew this medicine. You can take it with or without food. If it upsets your stomach, take it with food. Take your medicine at regular intervals. Do not take it more often than directed. Do not stop taking except on your doctor's advice. Talk to your pediatrician regarding the use of this medicine in children. Special care may be needed. Overdosage: If you think you have taken too much of this medicine contact a poison control center or emergency room at once. NOTE: This medicine is only for you. Do not share this medicine with others. What if I miss a dose? If you miss a dose, take it as soon as you can. If it is almost time for your next dose, take only that dose. Do not take double or extra doses. What may interact with this medicine? -certain medicines for bladder problems like fesoterodine, oxybutynin, solifenacin, tolterodine -desipramine -digoxin -flecainide -ketoconazole -MAOIs like Carbex, Eldepryl, Marplan, Nardil, and Parnate -metoprolol -propafenone -thioridazine -warfarin This list may not describe all possible  interactions. Give your health care provider a list of all the medicines, herbs, non-prescription drugs, or dietary supplements you use. Also tell them if you smoke, drink alcohol, or use illegal drugs. Some items may interact with your medicine. What should I watch for while using this medicine? It may take 8 weeks to notice the full benefit from this medicine. You may need to limit your intake tea, coffee, caffeinated sodas, and alcohol. These drinks may make your symptoms worse. Visit your doctor or health care professional for regular checks on your progress. Check your blood pressure as directed. Ask your doctor or health care professional what your blood pressure should be and when you should contact him or her. What side effects may I notice from receiving this medicine? Side effects that you should report to your doctor or health care professional as soon as possible: -allergic reactions like skin rash, itching or hives, swelling of the face, lips, or tongue -chest pain or palpitations -severe or sudden headache -high blood pressure -fast, irregular heartbeat -redness, blistering, peeling or loosening of the skin, including inside the mouth -signs of infection like fever or chills; cough; sore throat; pain or difficulty passing urine -trouble passing urine or change in the amount of urine Side effects that usually do not require medical attention (report to your doctor or health care professional if they continue or are bothersome): -constipation -diarrhea -dizziness -dry eyes -joint pain -mild headache -nausea -runny nose This list may not describe all possible side effects. Call your doctor for medical advice about side effects. You may report side effects to FDA at 1-800-FDA-1088. Where should I keep my medicine? Keep out of the reach   of children. Store at room temperature between 15 and 30 degrees C (59 and 86 degrees F). Throw away any unused medicine after the expiration  date. NOTE: This sheet is a summary. It may not cover all possible information. If you have questions about this medicine, talk to your doctor, pharmacist, or health care provider.  2018 Elsevier/Gold Standard (2015-04-03 12:14:30)  

## 2016-12-31 NOTE — Progress Notes (Signed)
  HPI:      [name] is a 71 y.o. who presents today for her pessary follow up and examination related to her pelvic floor weakening.  Pt reports tolerating the pessary well with  no vaginal bleeding and some vaginal discharge.  Symptoms of pelvic floor weakening not improved.  She currently has a #3ring pessary.  She has urinary urgency and frequency and nocturia, LOU with these sx's and less so with stressful maneuvers.  Feels pessary has no benefit.  PMHx: She  has a past medical history of Allergy; Anxiety; Arthritis; Bilateral dry eyes; Dental crowns present; Fibromyalgia; GERD (gastroesophageal reflux disease); Hyperlipidemia; Hypertension; Kidney stones; Myocardial infarction ([place]) (04/22/2006); Osteoarthritis; Rheumatoid arthritis ([place]); and Thyroid disease. Also,  has a past surgical history that includes Appendectomy; Cesarean section; Abdominal hysterectomy; Bunionectomy (Bilateral); dental implant; Colonoscopy (2010); Coronary angioplasty with stent (04/2006); Shoulder arthroscopy (Right, 05/07/2015); Breast biopsy (Right); Breast cyst aspiration (Left); and epidural steroid injection (2018)., family history includes Diabetes in her sister; Heart disease in her father; Pancreatic cancer in her sister; Prostate cancer in her brother; Stroke in her mother.,  reports that she has quit smoking. Her smoking use included Cigarettes. She quit smokeless tobacco use about 10 years ago. She reports that she does not drink alcohol or use drugs.  She has a current medication list which includes the following prescription(s): aspirin ec, chlorhexidine, citalopram, diazepam, diclofenac-misoprostol, esomeprazole, hydrocodone-acetaminophen, levothyroxine, lorazepam, losartan, mirabegron er, pravastatin, restasis, and tramadol. Also, is allergic to augmentin [amoxicillin-pot clavulanate]; codeine; sulfa antibiotics; and shellfish allergy.  Review of Systems  All other systems reviewed and are  negative.  Objective: BP 120/80   Pulse 87   Ht 5\' 8"  (1.727 m)   Wt 185 lb (83.9 kg)   BMI 28.13 kg/m  Physical Exam  Constitutional: She is oriented to person, place, and time. She appears well-developed and well-nourished. No distress.  Genitourinary: Vagina normal and uterus normal. Pelvic exam was performed with patient supine. There is no rash, tenderness or lesion on the right labia. There is no rash, tenderness or lesion on the left labia. No erythema or bleeding in the vagina. Right adnexum does not display mass and does not display tenderness. Left adnexum does not display mass and does not display tenderness. Cervix does not exhibit motion tenderness, discharge, polyp or nabothian cyst.   Uterus is mobile and midaxial. Uterus is not enlarged or exhibiting a mass.  Abdominal: Soft. She exhibits no distension. There is no tenderness.  Musculoskeletal: Normal range of motion.  Neurological: She is alert and oriented to person, place, and time. No cranial nerve deficit.  Skin: Skin is warm and dry.  Psychiatric: She has a normal mood and affect.   Pessary Care Pessary removed.  A/P:   Cystocele, Urge incontinence  Pessary discontinued, not of much benefit Meds for OAB discussed and Rx Pros and cons and SE discussed F/u A total of 15 minutes were spent face-to-face with the patient during this encounter and over half of that time dealt with counseling and coordination of care.  Barnett Applebaum, MD, [facility], [facility] 12/31/2016  10:48 AM

## 2016-12-31 NOTE — Patient Instructions (Signed)
Take flexeril up to three times per day.  Use ice or heat on posterior neck and left shoulder.

## 2016-12-31 NOTE — Progress Notes (Signed)
Date:  12/31/2016   Name:  Carrie Myers   DOB:  Oct 05, 1945   MRN:  [id number]   Chief Complaint: Neck Pain (Has disc in neck area causing problems. Pain is radiating in lower head and down across shoulders. ) and Immunizations (High Dose. -) Neck Pain   This is a recurrent problem. The problem occurs intermittently. The problem has been waxing and waning. The pain is present in the midline. The pain is moderate. Pertinent negatives include no chest pain, fever, headaches, numbness or weakness.  she has a history of cervical disc disease. Believe she had an MRI for 5 years ago. She tried to epidural steroid injections without benefit. Currently has hydrocodone, tramadol, and when necessary Valium to help with sleep. She does not have a TENS unit.She has not been taking any muscle relaxant or using ice or heat.  OSA - just had CPAP titration so we should be getting an order for equipment soon.  She felt that she slept very well during the study and would have slept even better if neck was not painful.  Review of Systems  Constitutional: Negative for fatigue and fever.  Respiratory: Negative for shortness of breath and wheezing.   Cardiovascular: Negative for chest pain and palpitations.  Musculoskeletal: Positive for neck pain and neck stiffness.  Neurological: Negative for dizziness, tremors, seizures, weakness, numbness and headaches.  Psychiatric/Behavioral: Negative for dysphoric mood and sleep disturbance.    Patient Active Problem List   Diagnosis Date Noted  . Generalized osteoarthritis 12/23/2016  . Chronic pain syndrome 12/23/2016  . Chronic low back pain 12/23/2016  . Cystocele, midline 12/06/2016  . Urinary incontinence 12/06/2016  . Status post shoulder surgery 05/27/2015  . Impingement syndrome of right shoulder 04/17/2015  . Other shoulder lesions, right shoulder 04/17/2015  . Fibrocystic breast 01/18/2015  . Gastroesophageal reflux disease without esophagitis  01/18/2015  . Hypothyroidism, postablative 01/18/2015  . Fracture of right foot 01/14/2015  . Arteriosclerosis of coronary artery 12/29/2014  . DJD of shoulder 11/13/2014  . Fibromyalgia 10/02/2014  . Intercostal neuralgia 10/02/2014  . DDD (degenerative disc disease), lumbar 10/02/2014  . Facet syndrome, lumbar 10/02/2014  . Sacroiliac joint disease 10/02/2014  . Carotid artery narrowing 07/09/2014  . Benign essential HTN 07/01/2014  . Osteoarthritis of thumb 09/24/2012  . Mixed hyperlipidemia 09/22/2012  . Arthritis of hand, degenerative 09/22/2012    Prior to Admission medications   Medication Sig Start Date End Date Taking? Authorizing Provider  aspirin EC 81 MG tablet Take 81 mg by mouth daily.   Yes [provider]  chlorhexidine (PERIDEX) 0.12 % solution RINSE 1/2 OUNCE BID FOR 30 SECONDS 12/15/16  Yes [provider]  citalopram (CELEXA) 20 MG tablet TAKE 1 TABLET BY MOUTH  DAILY 01/18/16  Yes [name], MD  diazepam (VALIUM) 5 MG tablet Limit  1/2  tablet by mouth per day or every other day if tolerated 11/17/16  Yes Molli Barrows, MD  Diclofenac-Misoprostol 75-0.2 MG TBEC TAKE 1 TABLET BY MOUTH TWO  TIMES DAILY 12/29/16  Yes [name], MD  esomeprazole (NEXIUM) 20 MG capsule Take 40 mg by mouth daily. Over the Counter   Yes [provider]  HYDROcodone-acetaminophen (NORCO/VICODIN) 5-325 MG tablet Take 1 tablet by mouth 2 (two) times daily at 10 AM and 5 PM. Limit 1-2 tablets by mouth per day if tolerated 01/21/17 02/20/17 Yes King, Diona Foley, NP  levothyroxine (SYNTHROID, LEVOTHROID) 88 MCG tablet TAKE 1 TABLET  BY MOUTH  DAILY 03/26/16  Yes [name], MD  LORazepam (ATIVAN) 2 MG tablet TK 1 T PO 2 HOURS PRIOR TO APPOINTMENT 12/15/16  Yes [provider]  losartan (COZAAR) 50 MG tablet Take 50 mg by mouth daily.   Yes [provider]  mirabegron ER (MYRBETRIQ) 25 MG TB24 tablet Take 1 tablet (25 mg total) by mouth  daily. 12/31/16  Yes Gae Dry, MD  pravastatin (PRAVACHOL) 80 MG tablet Take 80 mg by mouth daily.   Yes [provider]  RESTASIS 0.05 % ophthalmic emulsion Take 1 drop by mouth 2 (two) times daily. 10/28/16  Yes [provider]  traMADol (ULTRAM) 50 MG tablet Take 1 tablet (50 mg total) by mouth every 6 (six) hours as needed. 09/07/16  Yes Molli Barrows, MD    Allergies  Allergen Reactions  . Augmentin [Amoxicillin-Pot Clavulanate] Nausea And Vomiting  . Codeine Nausea And Vomiting  . Sulfa Antibiotics Nausea And Vomiting  . Shellfish Allergy Nausea And Vomiting, Nausea Only and Other (See Comments)    No issues with iodine     Past Surgical History:  Procedure Laterality Date  . ABDOMINAL HYSTERECTOMY     total  . APPENDECTOMY    . BREAST BIOPSY Right    neg  . BREAST CYST ASPIRATION Left    neg  . BUNIONECTOMY Bilateral   . CESAREAN SECTION    . COLONOSCOPY  2010   normal  . CORONARY ANGIOPLASTY WITH STENT PLACEMENT  04/2006  . dental implant     [age]  . epidural steroid injection  2018  . SHOULDER ARTHROSCOPY Right 05/07/2015   Procedure: Extensive arthroscopic debridement and arthroscopic subacromial decompression, right shoulder.  2. Steroid injection right thumb CMC joint.;  Surgeon: Corky Mull, MD;  Location: [place];  Service: Orthopedics;  Laterality: Right;    Social History  Substance Use Topics  . Smoking status: Former Smoker    Types: Cigarettes  . Smokeless tobacco: Former Systems developer    Quit date: 04/22/2006     Comment: quit 8 years  . Alcohol use No     Medication list has been reviewed and updated.  PHQ 2/9 Scores 12/22/2016 11/17/2016 11/05/2016 09/07/2016  PHQ - 2 Score 0 0 - 0  PHQ- 9 Score - - - -  Exception Documentation - - Medical reason -    Physical Exam  Constitutional: She is oriented to person, place, and time. She appears well-developed. No distress.  HENT:  Head: Normocephalic  and atraumatic.  Cardiovascular: Normal rate, regular rhythm and normal heart sounds.   Pulmonary/Chest: Effort normal and breath sounds normal. No respiratory distress. She has no wheezes.  Musculoskeletal: Normal range of motion.       Cervical back: She exhibits tenderness and spasm.  Lymphadenopathy:    She has no axillary adenopathy.  Neurological: She is alert and oriented to person, place, and time. She has normal strength and normal reflexes. No cranial nerve deficit or sensory deficit.  Skin: Skin is warm and dry. No rash noted.  Psychiatric: She has a normal mood and affect. Her speech is normal and behavior is normal. Thought content normal.  Nursing note and vitals reviewed.   BP 108/64   Pulse 80   Ht 5\' 8"  (1.727 m)   Wt 185 lb (83.9 kg)   SpO2 98%   BMI 28.13 kg/m   Assessment and Plan: 1. Cervical disc disease Add heat and flexeril - cyclobenzaprine (  FLEXERIL) 10 MG tablet; Take 1 tablet (10 mg total) by mouth 3 (three) times daily as needed for muscle spasms.  Dispense: 30 tablet; Refill: 0  2. OSA (obstructive sleep apnea) Awaiting equipment orders  3. Need for influenza vaccination - Flu vaccine HIGH DOSE PF  4. Need for shingles vaccine - Zoster Vaccine Adjuvanted [facility]) injection; Inject 0.5 mLs into the muscle once.  Dispense: 0.5 mL; Refill: 1   Meds ordered this encounter  Medications  . cyclobenzaprine (FLEXERIL) 10 MG tablet    Sig: Take 1 tablet (10 mg total) by mouth 3 (three) times daily as needed for muscle spasms.    Dispense:  30 tablet    Refill:  0  . Zoster Vaccine Adjuvanted [facility]) injection    Sig: Inject 0.5 mLs into the muscle once.    Dispense:  0.5 mL    Refill:  1    Partially dictated using Editor, commissioning. Any errors are unintentional.  Halina Maidens, MD [facility]  12/31/2016

## 2017-01-05 ENCOUNTER — Telehealth: Payer: Self-pay | Admitting: Obstetrics & Gynecology

## 2017-01-05 ENCOUNTER — Other Ambulatory Visit: Payer: Self-pay | Admitting: Obstetrics & Gynecology

## 2017-01-05 MED ORDER — TOLTERODINE TARTRATE ER 4 MG PO CP24: 4.0000 mg | ORAL_CAPSULE | Freq: Every day | ORAL | 11 refills | Status: DC

## 2017-01-05 NOTE — Telephone Encounter (Signed)
lmtrc

## 2017-01-05 NOTE — Telephone Encounter (Signed)
Patient states Rx Gerald Stabs is too expensive for her (149$), she cannot use coupon given as Medicare disqualifies her.  She would like something else sent in to her pharmacy, [facility].

## 2017-01-05 NOTE — Telephone Encounter (Signed)
Change of medicine made by eRx (Detrol [place], daily)

## 2017-01-06 NOTE — Telephone Encounter (Signed)
Pt is returning missed call. Please advise °

## 2017-01-18 ENCOUNTER — Other Ambulatory Visit: Payer: Self-pay | Admitting: Internal Medicine

## 2017-01-18 DIAGNOSIS — M5090 Cervical disc disorder, unspecified, unspecified cervical region: Secondary | ICD-10-CM

## 2017-02-14 ENCOUNTER — Ambulatory Visit: Payer: Medicare Other

## 2017-02-18 ENCOUNTER — Encounter: Payer: Self-pay | Admitting: Internal Medicine

## 2017-02-18 ENCOUNTER — Ambulatory Visit (INDEPENDENT_AMBULATORY_CARE_PROVIDER_SITE_OTHER): Payer: Medicare Other | Admitting: Internal Medicine

## 2017-02-18 VITALS — BP 124/84 | HR 89 | Temp 97.9°F | Resp 16 | Ht 68.0 in | Wt 180.0 lb

## 2017-02-18 DIAGNOSIS — M5090 Cervical disc disorder, unspecified, unspecified cervical region: Secondary | ICD-10-CM | POA: Diagnosis not present

## 2017-02-18 DIAGNOSIS — J0100 Acute maxillary sinusitis, unspecified: Secondary | ICD-10-CM | POA: Diagnosis not present

## 2017-02-18 DIAGNOSIS — Z1231 Encounter for screening mammogram for malignant neoplasm of breast: Secondary | ICD-10-CM | POA: Diagnosis not present

## 2017-02-18 DIAGNOSIS — G4733 Obstructive sleep apnea (adult) (pediatric): Secondary | ICD-10-CM | POA: Insufficient documentation

## 2017-02-18 DIAGNOSIS — Z9989 Dependence on other enabling machines and devices: Secondary | ICD-10-CM | POA: Diagnosis not present

## 2017-02-18 MED ORDER — CYCLOBENZAPRINE HCL 10 MG PO TABS: 10.0000 mg | ORAL_TABLET | Freq: Three times a day (TID) | ORAL | 0 refills | Status: DC

## 2017-02-18 MED ORDER — LEVOFLOXACIN 500 MG PO TABS: 500.0000 mg | ORAL_TABLET | Freq: Every day | ORAL | 0 refills | Status: DC

## 2017-02-18 NOTE — Progress Notes (Signed)
Date:  02/18/2017   Name:  Carrie Myers   DOB:  October 09, 1945   MRN:  [id number]   Chief Complaint: Sinus Problem (nose started hurting 4 days ago and did just start CPAP 2 weeks ago.)  Sinus Problem  This is a new problem. The current episode started in the past 7 days. The problem has been gradually worsening since onset. There has been no fever. The pain is mild. Associated symptoms include congestion, neck pain and sinus pressure. Pertinent negatives include no chills, headaches, hoarse voice, shortness of breath or swollen glands.  Shoulder Pain   The pain is present in the left shoulder and neck. This is a recurrent problem. The problem occurs every several days. The problem has been waxing and waning. The quality of the pain is described as burning. The pain is moderate. Associated symptoms include stiffness. Pertinent negatives include no fever. Treatments tried: muscle relaxants. The treatment provided significant relief.     Review of Systems  Constitutional: Negative for chills, fatigue and fever.  HENT: Positive for congestion, sinus pressure and sinus pain. Negative for hoarse voice, mouth sores, postnasal drip, trouble swallowing and voice change.   Respiratory: Negative for chest tightness, shortness of breath and wheezing.   Cardiovascular: Negative for chest pain and palpitations.  Musculoskeletal: Positive for arthralgias, neck pain, neck stiffness and stiffness.  Neurological: Negative for headaches.    Patient Active Problem List   Diagnosis Date Noted  . OSA on CPAP 02/18/2017  . Cervical disc disease 12/31/2016  . Generalized osteoarthritis 12/23/2016  . Chronic pain syndrome 12/23/2016  . Chronic low back pain 12/23/2016  . Cystocele, midline 12/06/2016  . Urinary incontinence 12/06/2016  . Status post shoulder surgery 05/27/2015  . Impingement syndrome of right shoulder 04/17/2015  . Other shoulder lesions, right shoulder 04/17/2015  . Fibrocystic  breast 01/18/2015  . Gastroesophageal reflux disease without esophagitis 01/18/2015  . Hypothyroidism, postablative 01/18/2015  . Fracture of right foot 01/14/2015  . Arteriosclerosis of coronary artery 12/29/2014  . DJD of shoulder 11/13/2014  . Fibromyalgia 10/02/2014  . Intercostal neuralgia 10/02/2014  . DDD (degenerative disc disease), lumbar 10/02/2014  . Facet syndrome, lumbar 10/02/2014  . Sacroiliac joint disease 10/02/2014  . Carotid artery narrowing 07/09/2014  . Benign essential HTN 07/01/2014  . Osteoarthritis of thumb 09/24/2012  . Mixed hyperlipidemia 09/22/2012  . Arthritis of hand, degenerative 09/22/2012    Prior to Admission medications   Medication Sig Start Date End Date Taking? Authorizing Provider  aspirin EC 81 MG tablet Take 81 mg by mouth daily.   Yes [provider]  chlorhexidine (PERIDEX) 0.12 % solution RINSE 1/2 OUNCE BID FOR 30 SECONDS 12/15/16  Yes [provider]  citalopram (CELEXA) 20 MG tablet TAKE 1 TABLET BY MOUTH  DAILY 01/18/16  Yes [name], MD  cyclobenzaprine (FLEXERIL) 10 MG tablet TAKE 1 TABLET(10 MG) BY MOUTH THREE TIMES DAILY AS NEEDED FOR MUSCLE SPASMS 01/18/17  Yes [name], MD  diazepam (VALIUM) 5 MG tablet Limit  1/2  tablet by mouth per day or every other day if tolerated 11/17/16  Yes Molli Barrows, MD  Diclofenac-Misoprostol 75-0.2 MG TBEC TAKE 1 TABLET BY MOUTH TWO  TIMES DAILY 12/29/16  Yes [name], MD  esomeprazole (NEXIUM) 20 MG capsule Take 40 mg by mouth daily. Over the Counter   Yes [provider]  HYDROcodone-acetaminophen (NORCO/VICODIN) 5-325 MG tablet Take 1 tablet by mouth 2 (two) times daily at  10 AM and 5 PM. Limit 1-2 tablets by mouth per day if tolerated 01/21/17 02/20/17 Yes King, Diona Foley, NP  levothyroxine (SYNTHROID, LEVOTHROID) 88 MCG tablet TAKE 1 TABLET BY MOUTH  DAILY 03/26/16  Yes [name], MD  LORazepam (ATIVAN) 2 MG tablet TK 1 T PO 2 HOURS PRIOR TO  APPOINTMENT 12/15/16  Yes [provider]  losartan (COZAAR) 50 MG tablet Take 50 mg by mouth daily.   Yes [provider]  Multiple Vitamin (MULTI-VITAMINS) TABS Take by mouth.   Yes [provider]  pravastatin (PRAVACHOL) 80 MG tablet Take 80 mg by mouth daily.   Yes [provider]  predniSONE (DELTASONE) 5 MG tablet prednisone 5 mg tablets in a dose pack   Yes [provider]  RESTASIS 0.05 % ophthalmic emulsion Take 1 drop by mouth 2 (two) times daily. 10/28/16  Yes [provider]  tolterodine (DETROL LA) 4 MG 24 hr capsule Take 1 capsule (4 mg total) by mouth daily. 01/05/17  Yes Gae Dry, MD  traMADol (ULTRAM) 50 MG tablet Take 1 tablet (50 mg total) by mouth every 6 (six) hours as needed. 09/07/16  Yes Molli Barrows, MD    Allergies  Allergen Reactions  . Augmentin [Amoxicillin-Pot Clavulanate] Nausea And Vomiting  . Codeine Nausea And Vomiting  . Sulfa Antibiotics Nausea And Vomiting  . Shellfish Allergy Nausea And Vomiting, Nausea Only and Other (See Comments)    No issues with iodine     Past Surgical History:  Procedure Laterality Date  . ABDOMINAL HYSTERECTOMY     total  . APPENDECTOMY    . BREAST BIOPSY Right    neg  . BREAST CYST ASPIRATION Left    neg  . BUNIONECTOMY Bilateral   . CESAREAN SECTION    . COLONOSCOPY  2010   normal  . CORONARY ANGIOPLASTY WITH STENT PLACEMENT  04/2006  . dental implant     [age]  . epidural steroid injection  2018  . SHOULDER ARTHROSCOPY Right 05/07/2015   Procedure: Extensive arthroscopic debridement and arthroscopic subacromial decompression, right shoulder.  2. Steroid injection right thumb CMC joint.;  Surgeon: Corky Mull, MD;  Location: [place];  Service: Orthopedics;  Laterality: Right;    Social History   Tobacco Use  . Smoking status: Former Smoker    Types: Cigarettes  . Smokeless tobacco: Former Systems developer    Quit date:  04/22/2006  . Tobacco comment: quit 8 years  Substance Use Topics  . Alcohol use: No    Alcohol/week: 0.0 oz  . Drug use: No     Medication list has been reviewed and updated.  PHQ 2/9 Scores 12/22/2016 11/17/2016 11/05/2016 09/07/2016  PHQ - 2 Score 0 0 - 0  PHQ- 9 Score - - - -  Exception Documentation - - Medical reason -    Physical Exam  Constitutional: She is oriented to person, place, and time. She appears well-developed and well-nourished.  HENT:  Right Ear: External ear and ear canal normal. Tympanic membrane is erythematous and retracted.  Left Ear: Tympanic membrane, external ear and ear canal normal. Tympanic membrane is not erythematous and not retracted.  Nose: Mucosal edema and sinus tenderness present. No nasal deformity. No epistaxis. Right sinus exhibits maxillary sinus tenderness. Right sinus exhibits no frontal sinus tenderness. Left sinus exhibits maxillary sinus tenderness. Left sinus exhibits no frontal sinus tenderness.  Mouth/Throat: Uvula is midline and mucous membranes are normal. No oral lesions. Posterior oropharyngeal  erythema present. No oropharyngeal exudate.  Neck: Normal range of motion. Neck supple.  Cardiovascular: Normal rate, regular rhythm and normal heart sounds.  Pulmonary/Chest: Breath sounds normal. She has no wheezes. She has no rales. She exhibits no tenderness.  Lymphadenopathy:    She has no cervical adenopathy.  Neurological: She is alert and oriented to person, place, and time.    BP 124/84   Pulse 89   Temp 97.9 F (36.6 C)   Resp 16   Ht 5\' 8"  (1.727 m)   Wt 180 lb (81.6 kg)   SpO2 98%   BMI 27.37 kg/m   Assessment and Plan: 1. Acute non-recurrent maxillary sinusitis Use saline nasal - levofloxacin (LEVAQUIN) 500 MG tablet; Take 1 tablet (500 mg total) by mouth daily.  Dispense: 7 tablet; Refill: 0  2. OSA on CPAP Continue nightly use with humidifier  3. Cervical disc disease Continue flexeril PRN - cyclobenzaprine  (FLEXERIL) 10 MG tablet; Take 1 tablet (10 mg total) by mouth 3 (three) times daily.  Dispense: 270 tablet; Refill: 0   Meds ordered this encounter  Medications  . levofloxacin (LEVAQUIN) 500 MG tablet    Sig: Take 1 tablet (500 mg total) by mouth daily.    Dispense:  7 tablet    Refill:  0  . cyclobenzaprine (FLEXERIL) 10 MG tablet    Sig: Take 1 tablet (10 mg total) by mouth 3 (three) times daily.    Dispense:  270 tablet    Refill:  0    Partially dictated using Editor, commissioning. Any errors are unintentional.  Halina Maidens, MD [facility]  02/18/2017

## 2017-02-21 ENCOUNTER — Ambulatory Visit: Payer: Medicare Other | Admitting: Nurse Practitioner

## 2017-02-21 ENCOUNTER — Other Ambulatory Visit: Payer: Self-pay | Admitting: Internal Medicine

## 2017-02-22 ENCOUNTER — Encounter: Payer: Medicare Other | Admitting: Nurse Practitioner

## 2017-02-25 ENCOUNTER — Encounter: Payer: Self-pay | Admitting: Nurse Practitioner

## 2017-02-25 ENCOUNTER — Encounter: Payer: Medicare Other | Admitting: Nurse Practitioner

## 2017-02-25 ENCOUNTER — Ambulatory Visit: Payer: Medicare Other | Attending: Nurse Practitioner | Admitting: Nurse Practitioner

## 2017-02-25 ENCOUNTER — Other Ambulatory Visit: Payer: Self-pay

## 2017-02-25 VITALS — BP 154/79 | HR 92 | Temp 97.8°F | Resp 18 | Ht 68.0 in | Wt 190.0 lb

## 2017-02-25 DIAGNOSIS — Z885 Allergy status to narcotic agent status: Secondary | ICD-10-CM | POA: Insufficient documentation

## 2017-02-25 DIAGNOSIS — M5442 Lumbago with sciatica, left side: Secondary | ICD-10-CM | POA: Insufficient documentation

## 2017-02-25 DIAGNOSIS — G4733 Obstructive sleep apnea (adult) (pediatric): Secondary | ICD-10-CM | POA: Insufficient documentation

## 2017-02-25 DIAGNOSIS — M5090 Cervical disc disorder, unspecified, unspecified cervical region: Secondary | ICD-10-CM | POA: Insufficient documentation

## 2017-02-25 DIAGNOSIS — M159 Polyosteoarthritis, unspecified: Secondary | ICD-10-CM | POA: Insufficient documentation

## 2017-02-25 DIAGNOSIS — I252 Old myocardial infarction: Secondary | ICD-10-CM | POA: Insufficient documentation

## 2017-02-25 DIAGNOSIS — Z880 Allergy status to penicillin: Secondary | ICD-10-CM | POA: Diagnosis not present

## 2017-02-25 DIAGNOSIS — G894 Chronic pain syndrome: Secondary | ICD-10-CM

## 2017-02-25 DIAGNOSIS — E785 Hyperlipidemia, unspecified: Secondary | ICD-10-CM | POA: Insufficient documentation

## 2017-02-25 DIAGNOSIS — Z79899 Other long term (current) drug therapy: Secondary | ICD-10-CM | POA: Diagnosis not present

## 2017-02-25 DIAGNOSIS — Z79890 Hormone replacement therapy (postmenopausal): Secondary | ICD-10-CM | POA: Insufficient documentation

## 2017-02-25 DIAGNOSIS — Z5181 Encounter for therapeutic drug level monitoring: Secondary | ICD-10-CM | POA: Diagnosis not present

## 2017-02-25 DIAGNOSIS — M533 Sacrococcygeal disorders, not elsewhere classified: Secondary | ICD-10-CM | POA: Diagnosis not present

## 2017-02-25 DIAGNOSIS — I10 Essential (primary) hypertension: Secondary | ICD-10-CM | POA: Diagnosis not present

## 2017-02-25 DIAGNOSIS — E039 Hypothyroidism, unspecified: Secondary | ICD-10-CM | POA: Insufficient documentation

## 2017-02-25 DIAGNOSIS — Z91013 Allergy to seafood: Secondary | ICD-10-CM | POA: Insufficient documentation

## 2017-02-25 DIAGNOSIS — G8929 Other chronic pain: Secondary | ICD-10-CM | POA: Diagnosis not present

## 2017-02-25 DIAGNOSIS — Z7982 Long term (current) use of aspirin: Secondary | ICD-10-CM | POA: Insufficient documentation

## 2017-02-25 DIAGNOSIS — M797 Fibromyalgia: Secondary | ICD-10-CM | POA: Insufficient documentation

## 2017-02-25 DIAGNOSIS — M5441 Lumbago with sciatica, right side: Secondary | ICD-10-CM | POA: Insufficient documentation

## 2017-02-25 DIAGNOSIS — R32 Unspecified urinary incontinence: Secondary | ICD-10-CM | POA: Insufficient documentation

## 2017-02-25 DIAGNOSIS — K219 Gastro-esophageal reflux disease without esophagitis: Secondary | ICD-10-CM | POA: Diagnosis not present

## 2017-02-25 DIAGNOSIS — Z87891 Personal history of nicotine dependence: Secondary | ICD-10-CM | POA: Insufficient documentation

## 2017-02-25 DIAGNOSIS — M5136 Other intervertebral disc degeneration, lumbar region: Secondary | ICD-10-CM | POA: Diagnosis not present

## 2017-02-25 DIAGNOSIS — Z87442 Personal history of urinary calculi: Secondary | ICD-10-CM | POA: Insufficient documentation

## 2017-02-25 DIAGNOSIS — M069 Rheumatoid arthritis, unspecified: Secondary | ICD-10-CM | POA: Diagnosis not present

## 2017-02-25 DIAGNOSIS — F419 Anxiety disorder, unspecified: Secondary | ICD-10-CM | POA: Diagnosis not present

## 2017-02-25 MED ORDER — HYDROCODONE-ACETAMINOPHEN 5-325 MG PO TABS: 1.0000 | ORAL_TABLET | Freq: Two times a day (BID) | ORAL | 0 refills | Status: DC

## 2017-02-25 MED ORDER — TRAMADOL HCL 50 MG PO TABS: 50.0000 mg | ORAL_TABLET | Freq: Four times a day (QID) | ORAL | 1 refills | Status: DC | PRN

## 2017-02-25 MED ORDER — DIAZEPAM 5 MG PO TABS: ORAL_TABLET | ORAL | 1 refills | Status: DC

## 2017-02-25 NOTE — Progress Notes (Signed)
Nursing Pain Medication Assessment:  Safety precautions to be maintained throughout the outpatient stay will include: orient to surroundings, keep bed in low position, maintain call bell within reach at all times, provide assistance with transfer out of bed and ambulation.  Medication Inspection Compliance: Pill count conducted under aseptic conditions, in front of the patient. Neither the pills nor the bottle was removed from the patient's sight at any time. Once count was completed pills were immediately returned to the patient in their original bottle.  Medication #1: Tramadol (Ultram) Pill/Patch Count: 6 of 90 pills remain Pill/Patch Appearance: Markings consistent with prescribed medication Bottle Appearance: Standard pharmacy container. Clearly labeled. Filled Date:11/12/ 2018 Last Medication intake:  Today  Medication #2: Hydrocodone/APAP Pill/Patch Count: 1 of 30 pills remain Pill/Patch Appearance: Markings consistent with prescribed medication Bottle Appearance: Standard pharmacy container. Clearly labeled. Filled Date: 11/12 / 2018 Last Medication intake:  Today

## 2017-02-25 NOTE — Patient Instructions (Addendum)
____________________________________________________________________________________________  Medication Rules  Applies to: All patients receiving prescriptions (written or electronic).  Pharmacy of record: Pharmacy where electronic prescriptions will be sent. If written prescriptions are taken to a different pharmacy, please inform the nursing staff. The pharmacy listed in the electronic medical record should be the one where you would like electronic prescriptions to be sent.  Prescription refills: Only during scheduled appointments. Applies to both, written and electronic prescriptions.  NOTE: The following applies primarily to controlled substances (Opioid* Pain Medications).   Patient's responsibilities: 1. Pain Pills: Bring all pain pills to every appointment (except for procedure appointments). 2. Pill Bottles: Bring pills in original pharmacy bottle. Always bring newest bottle. Bring bottle, even if empty. 3. Medication refills: You are responsible for knowing and keeping track of what medications you need refilled. The day before your appointment, write a list of all prescriptions that need to be refilled. Bring that list to your appointment and give it to the admitting nurse. Prescriptions will be written only during appointments. If you forget a medication, it will not be "Called in", "Faxed", or "electronically sent". You will need to get another appointment to get these prescribed. 4. Prescription Accuracy: You are responsible for carefully inspecting your prescriptions before leaving our office. Have the discharge nurse carefully go over each prescription with you, before taking them home. Make sure that your name is accurately spelled, that your address is correct. Check the name and dose of your medication to make sure it is accurate. Check the number of pills, and the written instructions to make sure they are clear and accurate. Make sure that you are given enough medication to  last until your next medication refill appointment. 5. Taking Medication: Take medication as prescribed. Never take more pills than instructed. Never take medication more frequently than prescribed. Taking less pills or less frequently is permitted and encouraged, when it comes to controlled substances (written prescriptions).  6. Inform other Doctors: Always inform, all of your healthcare providers, of all the medications you take. 7. Pain Medication from other Providers: You are not allowed to accept any additional pain medication from any other Doctor or Healthcare provider. There are two exceptions to this rule. (see below) In the event that you require additional pain medication, you are responsible for notifying us, as stated below. 8. Medication Agreement: You are responsible for carefully reading and following our Medication Agreement. This must be signed before receiving any prescriptions from our practice. Safely store a copy of your signed Agreement. Violations to the Agreement will result in no further prescriptions. (Additional copies of our Medication Agreement are available upon request.) 9. Laws, Rules, & Regulations: All patients are expected to follow all Federal and State Laws, Statutes, Rules, & Regulations. Ignorance of the Laws does not constitute a valid excuse. The use of any illegal substances is prohibited. 10. Adopted CDC guidelines & recommendations: Target dosing levels will be at or below 60 MME/day. Use of benzodiazepines** is not recommended.  Exceptions: There are only two exceptions to the rule of not receiving pain medications from other Healthcare Providers. 1. Exception #1 (Emergencies): In the event of an emergency (i.e.: accident requiring emergency care), you are allowed to receive additional pain medication. However, you are responsible for: As soon as you are able, call our office [phone], at any time of the day or night, and leave a message stating your  name, the date and nature of the emergency, and the name and dose of the medication   prescribed. In the event that your call is answered by a member of our staff, make sure to document and save the date, time, and the name of the person that took your information.  2. Exception #2 (Planned Surgery): In the event that you are scheduled by another doctor or dentist to have any type of surgery or procedure, you are allowed (for a period no longer than 30 days), to receive additional pain medication, for the acute post-op pain. However, in this case, you are responsible for picking up a copy of our "Post-op Pain Management for Surgeons" handout, and giving it to your surgeon or dentist. This document is available at our office, and does not require an appointment to obtain it. Simply go to our office during business hours (Monday-Thursday from 8:00 AM to 4:00 PM) (Friday 8:00 AM to 12:00 Noon) or if you have a scheduled appointment with us, prior to your surgery, and ask for it by name. In addition, you will need to provide us with your name, name of your surgeon, type of surgery, and date of procedure or surgery.  *Opioid medications include: morphine, codeine, oxycodone, oxymorphone, hydrocodone, hydromorphone, meperidine, tramadol, tapentadol, buprenorphine, fentanyl, methadone. **Benzodiazepine medications include: diazepam (Valium), alprazolam (Xanax), clonazepam (Klonopine), lorazepam (Ativan), clorazepate (Tranxene), chlordiazepoxide (Librium), estazolam (Prosom), oxazepam (Serax), temazepam (Restoril), triazolam (Halcion)  ____________________________________________________________________________________________  ____________________________________________________________________________________________  Pain Scale  Introduction: The pain score used by this practice is the Verbal Numerical Rating Scale (VNRS-11). This is an 11-point scale. It is for adults and children 10 years or older. There are  significant differences in how the pain score is reported, used, and applied. Forget everything you learned in the past and learn this scoring system.  General Information: The scale should reflect your current level of pain. Unless you are specifically asked for the level of your worst pain, or your average pain. If you are asked for one of these two, then it should be understood that it is over the past 24 hours.  Basic Activities of Daily Living (ADL): Personal hygiene, dressing, eating, transferring, and using restroom.  Instructions: Most patients tend to report their level of pain as a combination of two factors, their physical pain and their psychosocial pain. This last one is also known as "suffering" and it is reflection of how physical pain affects you socially and psychologically. From now on, report them separately. From this point on, when asked to report your pain level, report only your physical pain. Use the following table for reference.  Pain Clinic Pain Levels (0-5/10)  Pain Level Score  Description  No Pain 0   Mild pain 1 Nagging, annoying, but does not interfere with basic activities of daily living (ADL). Patients are able to eat, bathe, get dressed, toileting (being able to get on and off the toilet and perform personal hygiene functions), transfer (move in and out of bed or a chair without assistance), and maintain continence (able to control bladder and bowel functions). Blood pressure and heart rate are unaffected. A normal heart rate for a healthy adult ranges from 60 to 100 bpm (beats per minute).   Mild to moderate pain 2 Noticeable and distracting. Impossible to hide from other people. More frequent flare-ups. Still possible to adapt and function close to normal. It can be very annoying and may have occasional stronger flare-ups. With discipline, patients may get used to it and adapt.   Moderate pain 3 Interferes significantly with activities of daily living (ADL). It  becomes difficult   to feed, bathe, get dressed, get on and off the toilet or to perform personal hygiene functions. Difficult to get in and out of bed or a chair without assistance. Very distracting. With effort, it can be ignored when deeply involved in activities.   Moderately severe pain 4 Impossible to ignore for more than a few minutes. With effort, patients may still be able to manage work or participate in some social activities. Very difficult to concentrate. Signs of autonomic nervous system discharge are evident: dilated pupils (mydriasis); mild sweating (diaphoresis); sleep interference. Heart rate becomes elevated (>115 bpm). Diastolic blood pressure (lower number) rises above 100 mmHg. Patients find relief in laying down and not moving.   Severe pain 5 Intense and extremely unpleasant. Associated with frowning face and frequent crying. Pain overwhelms the senses.  Ability to do any activity or maintain social relationships becomes significantly limited. Conversation becomes difficult. Pacing back and forth is common, as getting into a comfortable position is nearly impossible. Pain wakes you up from deep sleep. Physical signs will be obvious: pupillary dilation; increased sweating; goosebumps; brisk reflexes; cold, clammy hands and feet; nausea, vomiting or dry heaves; loss of appetite; significant sleep disturbance with inability to fall asleep or to remain asleep. When persistent, significant weight loss is observed due to the complete loss of appetite and sleep deprivation.  Blood pressure and heart rate becomes significantly elevated. Caution: If elevated blood pressure triggers a pounding headache associated with blurred vision, then the patient should immediately seek attention at an urgent or emergency care unit, as these may be signs of an impending stroke.    Emergency Department Pain Levels (6-10/10)  Emergency Room Pain 6 Severely limiting. Requires emergency care and should not be  seen or managed at an outpatient pain management facility. Communication becomes difficult and requires great effort. Assistance to reach the emergency department may be required. Facial flushing and profuse sweating along with potentially dangerous increases in heart rate and blood pressure will be evident.   Distressing pain 7 Self-care is very difficult. Assistance is required to transport, or use restroom. Assistance to reach the emergency department will be required. Tasks requiring coordination, such as bathing and getting dressed become very difficult.   Disabling pain 8 Self-care is no longer possible. At this level, pain is disabling. The individual is unable to do even the most "basic" activities such as walking, eating, bathing, dressing, transferring to a bed, or toileting. Fine motor skills are lost. It is difficult to think clearly.   Incapacitating pain 9 Pain becomes incapacitating. Thought processing is no longer possible. Difficult to remember your own name. Control of movement and coordination are lost.   The worst pain imaginable 10 At this level, most patients pass out from pain. When this level is reached, collapse of the autonomic nervous system occurs, leading to a sudden drop in blood pressure and heart rate. This in turn results in a temporary and dramatic drop in blood flow to the brain, leading to a loss of consciousness. Fainting is one of the body's self defense mechanisms. Passing out puts the brain in a calmed state and causes it to shut down for a while, in order to begin the healing process.    Summary: 1. Refer to this scale when providing us with your pain level. 2. Be accurate and careful when reporting your pain level. This will help with your care. 3. Over-reporting your pain level will lead to loss of credibility. 4. Even a level of   1/10 means that there is pain and will be treated at our facility. 5. High, inaccurate reporting will be documented as "Symptom  Exaggeration", leading to loss of credibility and suspicions of possible secondary gains such as obtaining more narcotics, or wanting to appear disabled, for fraudulent reasons. 6. Only pain levels of 5 or below will be seen at our facility. 7. Pain levels of 6 and above will be sent to the Emergency Department and the appointment cancelled. ____________________________________________________________________________________________    BMI Assessment: Estimated body mass index is 28.89 kg/m as calculated from the following:   Height as of this encounter: 5\' 8"  (1.727 m).   Weight as of this encounter: 190 lb (86.2 kg).  BMI interpretation table: BMI level Category Range association with higher incidence of chronic pain  <18 kg/m2 Underweight   18.5-24.9 kg/m2 Ideal body weight   25-29.9 kg/m2 Overweight Increased incidence by 20%  30-34.9 kg/m2 Obese (Class I) Increased incidence by 68%  35-39.9 kg/m2 Severe obesity (Class II) Increased incidence by 136%  >40 kg/m2 Extreme obesity (Class III) Increased incidence by 254%   BMI Readings from Last 4 Encounters:  02/25/17 28.89 kg/m  02/18/17 27.37 kg/m  12/31/16 28.13 kg/m  12/31/16 28.13 kg/m   Wt Readings from Last 4 Encounters:  02/25/17 190 lb (86.2 kg)  02/18/17 180 lb (81.6 kg)  12/31/16 185 lb (83.9 kg)  12/31/16 185 lb (83.9 kg)

## 2017-02-25 NOTE — Progress Notes (Signed)
Patient's Name: Carrie Myers  MRN: [id number]  Referring Provider: [name], MD  DOB: 1945/10/08  PCP: [name], MD  DOS: 02/25/2017  Note by: Vevelyn Francois NP  Service setting: Ambulatory outpatient  Specialty: Interventional Pain Management  Location: [facility] (AMB) Pain Management Facility    Patient type: Established    Primary Reason(s) for Visit: Encounter for prescription drug management. (Level of risk: moderate)  CC: Back Pain (lower)  HPI  Carrie Myers is a 71 y.o. year old, female patient, who comes today for a medication management evaluation. She has Fibromyalgia; Intercostal neuralgia; DDD (degenerative disc disease), lumbar; Facet syndrome, lumbar; Sacroiliac joint disease; DJD of shoulder; Benign essential HTN; Carotid artery narrowing; Arteriosclerosis of coronary artery; Mixed hyperlipidemia; Arthritis of hand, degenerative; Fracture of right foot; Fibrocystic breast; Gastroesophageal reflux disease without esophagitis; Hypothyroidism, postablative; Impingement syndrome of right shoulder; Other shoulder lesions, right shoulder; Status post shoulder surgery; Cystocele, midline; Urinary incontinence; Generalized osteoarthritis; Osteoarthritis of thumb; Chronic pain syndrome; Chronic low back pain; Cervical disc disease; and OSA on CPAP on their problem list. Her primarily concern today is the Back Pain (lower)  Pain Assessment: Location: Lower Back Radiating: n/a Onset: More than a month ago Duration: Chronic pain Quality: Constant Severity: 8 /10 (self-reported pain score)  Note: Reported level is compatible with observation. Clinically the patient looks like a 2/10 A 2/10 is viewed as "Mild to Moderate" and described as noticeable and distracting. Impossible to hide from other people. More frequent flare-ups. Still possible to adapt and function close to normal. It can be very annoying and may have occasional stronger flare-ups. With discipline, patients  may get used to it and adapt. Information on the proper use of the pain scale provided to the patient today. When using our objective Pain Scale, levels between 6 and 10/10 are said to belong in an emergency room, as it progressively worsens from a 6/10, described as severely limiting, requiring emergency care not usually available at an outpatient pain management facility. At a 6/10 level, communication becomes difficult and requires great effort. Assistance to reach the emergency department may be required. Facial flushing and profuse sweating along with potentially dangerous increases in heart rate and blood pressure will be evident. Effect on ADL:   Timing: Constant Modifying factors: resting  Carrie Myers was last scheduled for an appointment on 12/22/2016 for medication management. During today's appointment we reviewed Carrie Myers's chronic pain status, as well as her outpatient medication regimen. She states that her pain is across her back and going down her thighs. She states that the pain has been getting worse. She would like to try an ESI. She states that she is more irritable. She states it is related to her pain. She is using the Tramadol and Norco. She admits that she uses the Norco daily. She does have sleep apnea and uses her CPAP. She admits that she is sleeping better but the pain is not controlled. She denies any numbness, tingling or weakness. She admits that she has occasional burning in her toes; 1-3 on the left.   The patient  reports that she does not use drugs. Her body mass index is 28.89 kg/m.  Further details on both, my assessment(s), as well as the proposed treatment plan, please see below.  Controlled Substance Pharmacotherapy Assessment REMS (Risk Evaluation and Mitigation Strategy)  Analgesic: Hydrocodone/APAP 5/356m 1-2 tabs daily prn + Tramadol 50 mg TID prn  MME/day: 30 mg/day.    Carrie Romberg DLouisiana  L, RN  02/25/2017 11:23 AM  Sign at close encounter Nursing  Pain Medication Assessment:  Safety precautions to be maintained throughout the outpatient stay will include: orient to surroundings, keep bed in low position, maintain call bell within reach at all times, provide assistance with transfer out of bed and ambulation.  Medication Inspection Compliance: Pill count conducted under aseptic conditions, in front of the patient. Neither the pills nor the bottle was removed from the patient's sight at any time. Once count was completed pills were immediately returned to the patient in their original bottle.  Medication #1: Tramadol (Ultram) Pill/Patch Count: 6 of 90 pills remain Pill/Patch Appearance: Markings consistent with prescribed medication Bottle Appearance: Standard pharmacy container. Clearly labeled. Filled Date:11/12/ 2018 Last Medication intake:  Today  Medication #2: Hydrocodone/APAP Pill/Patch Count: 1 of 30 pills remain Pill/Patch Appearance: Markings consistent with prescribed medication Bottle Appearance: Standard pharmacy container. Clearly labeled. Filled Date: 11/12 / 2018 Last Medication intake:  Today   Pharmacokinetics: Liberation and absorption (onset of action): WNL Distribution (time to peak effect): WNL Metabolism and excretion (duration of action): WNL         Pharmacodynamics: Desired effects: Analgesia: Carrie Myers reports >50% benefit. Functional ability: Patient reports that medication allows her to accomplish basic ADLs Clinically meaningful improvement in function (CMIF): Sustained CMIF goals met Perceived effectiveness: Described as relatively effective, allowing for increase in activities of daily living (ADL) Undesirable effects: Side-effects or Adverse reactions: None reported Monitoring: [place] PMP: Online review of the past 27-monthperiod conducted. Compliant with practice rules and regulations Last UDS on record: No results found for: SUMMARY UDS interpretation: Compliant          Medication  Assessment Form: Reviewed. Patient indicates being compliant with therapy Treatment compliance: Compliant Risk Assessment Profile: Aberrant behavior: See prior evaluations. None observed or detected today Comorbid factors increasing risk of overdose: See prior notes. No additional risks detected today Risk of substance use disorder (SUD): Low  ORT Scoring interpretation table:  Score <3 = Low Risk for SUD  Score between 4-7 = Moderate Risk for SUD  Score >8 = High Risk for Opioid Abuse   Risk Mitigation Strategies:  Patient Counseling: Covered Patient-Prescriber Agreement (PPA): Present and active  Notification to other healthcare providers: Done  Pharmacologic Plan: No change in therapy, at this time  Laboratory Chemistry  Inflammation Markers (CRP: Acute Phase) (ESR: Chronic Phase) No results found for: CRP, ESRSEDRATE, LATICACIDVEN               Rheumatology Markers No results found for: RF, ANA, LTherisa Doyne [facility]             Renal Function Markers Lab Results  Component Value Date   BUN 15 03/16/2016   CREATININE 0.86 03/16/2016   GFRAA 80 03/16/2016   GFRNONAA 70 03/16/2016                 Hepatic Function Markers Lab Results  Component Value Date   AST 27 03/16/2016   ALT 30 03/16/2016   ALBUMIN 4.4 03/16/2016   ALKPHOS 114 03/16/2016                 Electrolytes Lab Results  Component Value Date   NA 141 03/16/2016   K 4.6 03/16/2016   CL 99 03/16/2016   CALCIUM 9.2 03/16/2016                 Neuropathy Markers No results found for: VITAMINB12, FOLATE,  HGBA1C, HIV               Bone Pathology Markers No results found for: VD25OH, [id number], [id number], [id number], 25OHVITD1, 25OHVITD2, 25OHVITD3, TESTOFREE, TESTOSTERONE               Coagulation Parameters Lab Results  Component Value Date   PLT 215 03/16/2016                 Cardiovascular Markers Lab Results  Component Value Date   HGB 13.0 03/16/2016   HCT 39.4  03/16/2016                 CA Markers No results found for: CEA, CA125, LABCA2               Note: Lab results reviewed.  Recent Diagnostic Imaging Results  DG C-Arm 1-60 Min-No Report Fluoroscopy was utilized by the requesting physician.  No radiographic  interpretation.   Complexity Note: Imaging results reviewed. Results shared with Ms. Terriquez, using Layman's terms.                         Meds   Current Outpatient Medications:  .  aspirin EC 81 MG tablet, Take 81 mg by mouth daily., Disp: , Rfl:  .  chlorhexidine (PERIDEX) 0.12 % solution, RINSE 1/2 OUNCE BID FOR 30 SECONDS, Disp: , Rfl: 0 .  citalopram (CELEXA) 20 MG tablet, TAKE 1 TABLET BY MOUTH  DAILY, Disp: 90 tablet, Rfl: 3 .  cyclobenzaprine (FLEXERIL) 10 MG tablet, Take 1 tablet (10 mg total) by mouth 3 (three) times daily., Disp: 270 tablet, Rfl: 0 .  diazepam (VALIUM) 5 MG tablet, Limit  1/2  tablet by mouth per day or every other day if tolerated, Disp: 15 tablet, Rfl: 1 .  Diclofenac-Misoprostol 75-0.2 MG TBEC, TAKE 1 TABLET BY MOUTH TWO  TIMES DAILY, Disp: 180 tablet, Rfl: 1 .  esomeprazole (NEXIUM) 20 MG capsule, Take 40 mg by mouth daily. Over the Counter, Disp: , Rfl:  .  levothyroxine (SYNTHROID, LEVOTHROID) 88 MCG tablet, TAKE 1 TABLET BY MOUTH  DAILY, Disp: 90 tablet, Rfl: 3 .  losartan (COZAAR) 50 MG tablet, Take 50 mg by mouth daily., Disp: , Rfl:  .  Multiple Vitamin (MULTI-VITAMINS) TABS, Take by mouth., Disp: , Rfl:  .  NON FORMULARY, , Disp: , Rfl:  .  pravastatin (PRAVACHOL) 80 MG tablet, Take 80 mg by mouth daily., Disp: , Rfl:  .  tolterodine (DETROL LA) 4 MG 24 hr capsule, Take 1 capsule (4 mg total) by mouth daily., Disp: 30 capsule, Rfl: 11 .  traMADol (ULTRAM) 50 MG tablet, Take 1 tablet (50 mg total) by mouth every 6 (six) hours as needed., Disp: 90 tablet, Rfl: 5 .  HYDROcodone-acetaminophen (NORCO/VICODIN) 5-325 MG tablet, Take 1 tablet by mouth 2 (two) times daily at 10 AM and 5 PM. Limit  1-2 tablets by mouth per day if tolerated, Disp: 30 tablet, Rfl: 0  ROS  Constitutional: Denies any fever or chills Gastrointestinal: No reported hemesis, hematochezia, vomiting, or acute GI distress Musculoskeletal: Denies any acute onset joint swelling, redness, loss of ROM, or weakness Neurological: No reported episodes of acute onset apraxia, aphasia, dysarthria, agnosia, amnesia, paralysis, loss of coordination, or loss of consciousness  Allergies  Ms. Manfredonia is allergic to augmentin [amoxicillin-pot clavulanate]; codeine; sulfa antibiotics; and shellfish allergy.  [place]  Drug: Ms. Witherspoon  reports that she does not use drugs. Alcohol:  reports  that she does not drink alcohol. Tobacco:  reports that she has quit smoking. Her smoking use included cigarettes. She quit smokeless tobacco use about 10 years ago. Medical:  has a past medical history of Allergy, Anxiety, Arthritis, Bilateral dry eyes, Dental crowns present, Fibromyalgia, GERD (gastroesophageal reflux disease), Hyperlipidemia, Hypertension, Kidney stones, Myocardial infarction ([place]) (04/22/2006), Osteoarthritis, Rheumatoid arthritis ([place]), and Thyroid disease. Surgical: Ms. Predmore  has a past surgical history that includes Appendectomy; Cesarean section; Abdominal hysterectomy; Bunionectomy (Bilateral); dental implant; Colonoscopy (2010); Coronary angioplasty with stent (04/2006); Shoulder arthroscopy (Right, 05/07/2015); Breast biopsy (Right); Breast cyst aspiration (Left); and epidural steroid injection (2018). Family: family history includes Diabetes in her sister; Heart disease in her father; Pancreatic cancer in her sister; Prostate cancer in her brother; Stroke in her mother.  Constitutional Exam  General appearance: Well nourished, well developed, and well hydrated. In no apparent acute distress Vitals:   02/25/17 1118  BP: (!) 154/79  Pulse: 92  Resp: 18  Temp: 97.8 F (36.6 C)  TempSrc: Oral  SpO2: 98%   Weight: 190 lb (86.2 kg)  Height: 5' 8"  (1.727 m)  Psych/Mental status: Alert, oriented x 3 (person, place, & time)       Eyes: PERLA Respiratory: No evidence of acute respiratory distress  Cervical Spine Area Exam  Skin & Axial Inspection: No masses, redness, edema, swelling, or associated skin lesions Alignment: Symmetrical Functional ROM: Unrestricted ROM      Stability: No instability detected Muscle Tone/Strength: Functionally intact. No obvious neuro-muscular anomalies detected. Sensory (Neurological): Unimpaired Palpation: No palpable anomalies              Upper Extremity (UE) Exam    Side: Right upper extremity  Side: Left upper extremity  Skin & Extremity Inspection: Skin color, temperature, and hair growth are WNL. No peripheral edema or cyanosis. No masses, redness, swelling, asymmetry, or associated skin lesions. No contractures.  Skin & Extremity Inspection: Skin color, temperature, and hair growth are WNL. No peripheral edema or cyanosis. No masses, redness, swelling, asymmetry, or associated skin lesions. No contractures.  Functional ROM: Unrestricted ROM          Functional ROM: Unrestricted ROM          Muscle Tone/Strength: Functionally intact. No obvious neuro-muscular anomalies detected.  Muscle Tone/Strength: Functionally intact. No obvious neuro-muscular anomalies detected.  Sensory (Neurological): Unimpaired          Sensory (Neurological): Unimpaired          Palpation: No palpable anomalies              Palpation: No palpable anomalies              Specialized Test(s): Deferred         Specialized Test(s): Deferred          Thoracic Spine Area Exam  Skin & Axial Inspection: No masses, redness, or swelling Alignment: Symmetrical Functional ROM: Unrestricted ROM Stability: No instability detected Muscle Tone/Strength: Functionally intact. No obvious neuro-muscular anomalies detected. Sensory (Neurological): Unimpaired Muscle strength & Tone: No palpable  anomalies  Lumbar Spine Area Exam  Skin & Axial Inspection: No masses, redness, or swelling Alignment: Symmetrical Functional ROM: Unrestricted ROM      Stability: No instability detected Muscle Tone/Strength: Functionally intact. No obvious neuro-muscular anomalies detected. Sensory (Neurological): Unimpaired Palpation: No palpable anomalies       Provocative Tests: Lumbar Hyperextension and rotation test: evaluation deferred today       Lumbar Lateral bending test:  evaluation deferred today       Patrick's Maneuver: evaluation deferred today                    Gait & Posture Assessment  Ambulation: Unassisted Gait: Relatively normal for age and body habitus Posture: WNL   Lower Extremity Exam    Side: Right lower extremity  Side: Left lower extremity  Skin & Extremity Inspection: Skin color, temperature, and hair growth are WNL. No peripheral edema or cyanosis. No masses, redness, swelling, asymmetry, or associated skin lesions. No contractures.  Skin & Extremity Inspection: Skin color, temperature, and hair growth are WNL. No peripheral edema or cyanosis. No masses, redness, swelling, asymmetry, or associated skin lesions. No contractures.  Functional ROM: Unrestricted ROM          Functional ROM: Unrestricted ROM          Muscle Tone/Strength: Functionally intact. No obvious neuro-muscular anomalies detected.  Muscle Tone/Strength: Functionally intact. No obvious neuro-muscular anomalies detected.  Sensory (Neurological): Unimpaired  Sensory (Neurological): Unimpaired  Palpation: No palpable anomalies  Palpation: No palpable anomalies   Assessment  Primary Diagnosis & Pertinent Problem List: The primary encounter diagnosis was Chronic bilateral low back pain with bilateral sciatica. Diagnoses of Sacroiliac joint disease, DDD (degenerative disc disease), lumbar, and Chronic pain syndrome were also pertinent to this visit.  Status Diagnosis  Controlled Controlled Controlled 1.  Chronic bilateral low back pain with bilateral sciatica   2. Sacroiliac joint disease   3. DDD (degenerative disc disease), lumbar   4. Chronic pain syndrome     Problems updated and reviewed during this visit: Problem  Ddd (Degenerative Disc Disease), Lumbar  Sacroiliac Joint Disease   Plan of Care  Pharmacotherapy (Medications Ordered): No orders of the defined types were placed in this encounter. This SmartLink is deprecated. Use AVSMEDLIST instead to display the medication list for a patient. Medications administered today: [name] had no medications administered during this visit. Lab-work, procedure(s), and/or referral(s): No orders of the defined types were placed in this encounter.  Imaging and/or referral(s): None  Interventional therapies: Planned, scheduled, and/or pending:   LESI bilateral   Provider-requested follow-up: Return in about 2 months (around 04/28/2017) for w/ Dr. Andree Elk.  Future Appointments  Date Time Provider [place]  03/18/2017  9:30 AM [name], MD [facility] None  03/24/2017 11:45 AM Molli Barrows, MD [facility] None  04/04/2017  1:30 PM Gae Dry, MD [facility] None   Primary Care Physician: [name], MD Location: [facility] Outpatient Pain Management Facility Note by: Vevelyn Francois NP Date: 02/25/2017; Time: 11:49 AM  Pain Score Disclaimer: We use the NRS-11 scale. This is a self-reported, subjective measurement of pain severity with only modest accuracy. It is used primarily to identify changes within a particular patient. It must be understood that outpatient pain scales are significantly less accurate that those used for research, where they can be applied under ideal controlled circumstances with minimal exposure to variables. In reality, the score is likely to be a combination of pain intensity and pain affect, where pain affect describes the degree of emotional arousal or changes in action readiness caused by  the sensory experience of pain. Factors such as social and work situation, setting, emotional state, anxiety levels, expectation, and prior pain experience may influence pain perception and show large inter-individual differences that may also be affected by time variables.  Patient instructions provided during this appointment: Patient Instructions  ____________________________________________________________________________________________  Medication Rules  Applies to: All patients receiving prescriptions (written or electronic).  Pharmacy of record: Pharmacy where electronic prescriptions will be sent. If written prescriptions are taken to a different pharmacy, please inform the nursing staff. The pharmacy listed in the electronic medical record should be the one where you would like electronic prescriptions to be sent.  Prescription refills: Only during scheduled appointments. Applies to both, written and electronic prescriptions.  NOTE: The following applies primarily to controlled substances (Opioid* Pain Medications).   Patient's responsibilities: 1. Pain Pills: Bring all pain pills to every appointment (except for procedure appointments). 2. Pill Bottles: Bring pills in original pharmacy bottle. Always bring newest bottle. Bring bottle, even if empty. 3. Medication refills: You are responsible for knowing and keeping track of what medications you need refilled. The day before your appointment, write a list of all prescriptions that need to be refilled. Bring that list to your appointment and give it to the admitting nurse. Prescriptions will be written only during appointments. If you forget a medication, it will not be "Called in", "Faxed", or "electronically sent". You will need to get another appointment to get these prescribed. 4. Prescription Accuracy: You are responsible for carefully inspecting your prescriptions before leaving our office. Have the discharge nurse carefully go  over each prescription with you, before taking them home. Make sure that your name is accurately spelled, that your address is correct. Check the name and dose of your medication to make sure it is accurate. Check the number of pills, and the written instructions to make sure they are clear and accurate. Make sure that you are given enough medication to last until your next medication refill appointment. 5. Taking Medication: Take medication as prescribed. Never take more pills than instructed. Never take medication more frequently than prescribed. Taking less pills or less frequently is permitted and encouraged, when it comes to controlled substances (written prescriptions).  6. Inform other Doctors: Always inform, all of your healthcare providers, of all the medications you take. 7. Pain Medication from other Providers: You are not allowed to accept any additional pain medication from any other Doctor or Healthcare provider. There are two exceptions to this rule. (see below) In the event that you require additional pain medication, you are responsible for notifying us, as stated below. 8. Medication Agreement: You are responsible for carefully reading and following our Medication Agreement. This must be signed before receiving any prescriptions from our practice. Safely store a copy of your signed Agreement. Violations to the Agreement will result in no further prescriptions. (Additional copies of our Medication Agreement are available upon request.) 9. Laws, Rules, & Regulations: All patients are expected to follow all Federal and [employer], [employer], Rules, [employer]. Ignorance of the Laws does not constitute a valid excuse. The use of any illegal substances is prohibited. 10. Adopted CDC guidelines & recommendations: Target dosing levels will be at or below 60 MME/day. Use of benzodiazepines** is not recommended.  Exceptions: There are only two exceptions to the rule of not receiving pain  medications from other Healthcare Providers. 1. Exception #1 (Emergencies): In the event of an emergency (i.e.: accident requiring emergency care), you are allowed to receive additional pain medication. However, you are responsible for: As soon as you are able, call our office [phone], at any time of the day or night, and leave a message stating your name, the date and nature of the emergency, and the name and dose of the medication  prescribed. In the event that your call is answered by a member of our staff, make sure to document and save the date, time, and the name of the person that took your information.  2. Exception #2 (Planned Surgery): In the event that you are scheduled by another doctor or dentist to have any type of surgery or procedure, you are allowed (for a period no longer than 30 days), to receive additional pain medication, for the acute post-op pain. However, in this case, you are responsible for picking up a copy of our "Post-op Pain Management for Surgeons" handout, and giving it to your surgeon or dentist. This document is available at our office, and does not require an appointment to obtain it. Simply go to our office during business hours (Monday-Thursday from 8:00 AM to 4:00 PM) (Friday 8:00 AM to 12:00 Noon) or if you have a scheduled appointment with [place], prior to your surgery, and ask for it by name. In addition, you will need to provide [place] with your name, name of your surgeon, type of surgery, and date of procedure or surgery.  *Opioid medications include: morphine, codeine, oxycodone, oxymorphone, hydrocodone, hydromorphone, meperidine, tramadol, tapentadol, buprenorphine, fentanyl, methadone. **Benzodiazepine medications include: diazepam (Valium), alprazolam (Xanax), clonazepam (Klonopine), lorazepam (Ativan), clorazepate (Tranxene), chlordiazepoxide (Librium), estazolam (Prosom), oxazepam (Serax), temazepam (Restoril), triazolam  (Halcion)  ____________________________________________________________________________________________  ____________________________________________________________________________________________  Pain Scale  Introduction: The pain score used by this practice is the Verbal Numerical Rating Scale (VNRS-11). This is an 11-point scale. It is for adults and children 10 years or older. There are significant differences in how the pain score is reported, used, and applied. Forget everything you learned in the past and learn this scoring system.  General Information: The scale should reflect your current level of pain. Unless you are specifically asked for the level of your worst pain, or your average pain. If you are asked for one of these two, then it should be understood that it is over the past 24 hours.  Basic Activities of Daily Living (ADL): Personal hygiene, dressing, eating, transferring, and using restroom.  Instructions: Most patients tend to report their level of pain as a combination of two factors, their physical pain and their psychosocial pain. This last one is also known as "suffering" and it is reflection of how physical pain affects you socially and psychologically. From now on, report them separately. From this point on, when asked to report your pain level, report only your physical pain. Use the following table for reference.  Pain Clinic Pain Levels (0-5/10)  Pain Level Score  Description  No Pain 0   Mild pain 1 Nagging, annoying, but does not interfere with basic activities of daily living (ADL). Patients are able to eat, bathe, get dressed, toileting (being able to get on and off the toilet and perform personal hygiene functions), transfer (move in and out of bed or a chair without assistance), and maintain continence (able to control bladder and bowel functions). Blood pressure and heart rate are unaffected. A normal heart rate for a healthy adult ranges from 60 to 100 bpm  (beats per minute).   Mild to moderate pain 2 Noticeable and distracting. Impossible to hide from other people. More frequent flare-ups. Still possible to adapt and function close to normal. It can be very annoying and may have occasional stronger flare-ups. With discipline, patients may get used to it and adapt.   Moderate pain 3 Interferes significantly with activities of daily living (ADL). It becomes difficult  to feed, bathe, get dressed, get on and off the toilet or to perform personal hygiene functions. Difficult to get in and out of bed or a chair without assistance. Very distracting. With effort, it can be ignored when deeply involved in activities.   Moderately severe pain 4 Impossible to ignore for more than a few minutes. With effort, patients may still be able to manage work or participate in some social activities. Very difficult to concentrate. Signs of autonomic nervous system discharge are evident: dilated pupils (mydriasis); mild sweating (diaphoresis); sleep interference. Heart rate becomes elevated (>115 bpm). Diastolic blood pressure (lower number) rises above 100 mmHg. Patients find relief in laying down and not moving.   Severe pain 5 Intense and extremely unpleasant. Associated with frowning face and frequent crying. Pain overwhelms the senses.  Ability to do any activity or maintain social relationships becomes significantly limited. Conversation becomes difficult. Pacing back and forth is common, as getting into a comfortable position is nearly impossible. Pain wakes you up from deep sleep. Physical signs will be obvious: pupillary dilation; increased sweating; goosebumps; brisk reflexes; cold, clammy hands and feet; nausea, vomiting or dry heaves; loss of appetite; significant sleep disturbance with inability to fall asleep or to remain asleep. When persistent, significant weight loss is observed due to the complete loss of appetite and sleep deprivation.  Blood pressure and heart  rate becomes significantly elevated. Caution: If elevated blood pressure triggers a pounding headache associated with blurred vision, then the patient should immediately seek attention at an urgent or emergency care unit, as these may be signs of an impending stroke.    Emergency Department Pain Levels (6-10/10)  Emergency Room Pain 6 Severely limiting. Requires emergency care and should not be seen or managed at an outpatient pain management facility. Communication becomes difficult and requires great effort. Assistance to reach the emergency department may be required. Facial flushing and profuse sweating along with potentially dangerous increases in heart rate and blood pressure will be evident.   Distressing pain 7 Self-care is very difficult. Assistance is required to transport, or use restroom. Assistance to reach the emergency department will be required. Tasks requiring coordination, such as bathing and getting dressed become very difficult.   Disabling pain 8 Self-care is no longer possible. At this level, pain is disabling. The individual is unable to do even the most "basic" activities such as walking, eating, bathing, dressing, transferring to a bed, or toileting. Fine motor skills are lost. It is difficult to think clearly.   Incapacitating pain 9 Pain becomes incapacitating. Thought processing is no longer possible. Difficult to remember your own name. Control of movement and coordination are lost.   The worst pain imaginable 10 At this level, most patients pass out from pain. When this level is reached, collapse of the autonomic nervous system occurs, leading to a sudden drop in blood pressure and heart rate. This in turn results in a temporary and dramatic drop in blood flow to the brain, leading to a loss of consciousness. Fainting is one of the body's self defense mechanisms. Passing out puts the brain in a calmed state and causes it to shut down for a while, in order to begin the  healing process.    Summary: 1. Refer to this scale when providing [place] with your pain level. 2. Be accurate and careful when reporting your pain level. This will help with your care. 3. Over-reporting your pain level will lead to loss of credibility. 4. Even a level  of 1/10 means that there is pain and will be treated at our facility. 5. High, inaccurate reporting will be documented as "Symptom Exaggeration", leading to loss of credibility and suspicions of possible secondary gains such as obtaining more narcotics, or wanting to appear disabled, for fraudulent reasons. 6. Only pain levels of 5 or below will be seen at our facility. 7. Pain levels of 6 and above will be sent to the Emergency Department and the appointment cancelled. ____________________________________________________________________________________________    BMI Assessment: Estimated body mass index is 28.89 kg/m as calculated from the following:   Height as of this encounter: 5' 8"  (1.727 m).   Weight as of this encounter: 190 lb (86.2 kg).  BMI interpretation table: BMI level Category Range association with higher incidence of chronic pain  <18 kg/m2 Underweight   18.5-24.9 kg/m2 Ideal body weight   25-29.9 kg/m2 Overweight Increased incidence by 20%  30-34.9 kg/m2 Obese (Class I) Increased incidence by 68%  35-39.9 kg/m2 Severe obesity (Class II) Increased incidence by 136%  >40 kg/m2 Extreme obesity (Class III) Increased incidence by 254%   BMI Readings from Last 4 Encounters:  02/25/17 28.89 kg/m  02/18/17 27.37 kg/m  12/31/16 28.13 kg/m  12/31/16 28.13 kg/m   Wt Readings from Last 4 Encounters:  02/25/17 190 lb (86.2 kg)  02/18/17 180 lb (81.6 kg)  12/31/16 185 lb (83.9 kg)  12/31/16 185 lb (83.9 kg)

## 2017-02-28 DIAGNOSIS — H18833 Recurrent erosion of cornea, bilateral: Secondary | ICD-10-CM | POA: Diagnosis not present

## 2017-02-28 DIAGNOSIS — H018 Other specified inflammations of eyelid: Secondary | ICD-10-CM | POA: Diagnosis not present

## 2017-02-28 DIAGNOSIS — H04123 Dry eye syndrome of bilateral lacrimal glands: Secondary | ICD-10-CM | POA: Diagnosis not present

## 2017-02-28 DIAGNOSIS — H1852 Epithelial (juvenile) corneal dystrophy: Secondary | ICD-10-CM | POA: Diagnosis not present

## 2017-03-18 ENCOUNTER — Ambulatory Visit (INDEPENDENT_AMBULATORY_CARE_PROVIDER_SITE_OTHER): Payer: Medicare Other | Admitting: Internal Medicine

## 2017-03-18 ENCOUNTER — Encounter: Payer: Self-pay | Admitting: Internal Medicine

## 2017-03-18 VITALS — BP 127/70 | HR 97 | Ht 68.0 in | Wt 186.0 lb

## 2017-03-18 DIAGNOSIS — M797 Fibromyalgia: Secondary | ICD-10-CM

## 2017-03-18 DIAGNOSIS — E782 Mixed hyperlipidemia: Secondary | ICD-10-CM

## 2017-03-18 DIAGNOSIS — E890 Postprocedural hypothyroidism: Secondary | ICD-10-CM

## 2017-03-18 DIAGNOSIS — Z0000 Encounter for general adult medical examination without abnormal findings: Secondary | ICD-10-CM | POA: Diagnosis not present

## 2017-03-18 DIAGNOSIS — I10 Essential (primary) hypertension: Secondary | ICD-10-CM | POA: Diagnosis not present

## 2017-03-18 DIAGNOSIS — M5136 Other intervertebral disc degeneration, lumbar region: Secondary | ICD-10-CM | POA: Diagnosis not present

## 2017-03-18 DIAGNOSIS — K219 Gastro-esophageal reflux disease without esophagitis: Secondary | ICD-10-CM | POA: Diagnosis not present

## 2017-03-18 DIAGNOSIS — G4733 Obstructive sleep apnea (adult) (pediatric): Secondary | ICD-10-CM

## 2017-03-18 DIAGNOSIS — Z9989 Dependence on other enabling machines and devices: Secondary | ICD-10-CM | POA: Diagnosis not present

## 2017-03-18 LAB — POCT URINALYSIS DIPSTICK
BILIRUBIN UA: NEGATIVE
Blood, UA: NEGATIVE
Glucose, UA: NEGATIVE
KETONES UA: NEGATIVE
Nitrite, UA: NEGATIVE
Protein, UA: NEGATIVE
Spec Grav, UA: 1.025 (ref 1.010–1.025)
Urobilinogen, UA: 0.2 E.U./dL
pH, UA: 6 (ref 5.0–8.0)

## 2017-03-18 MED ORDER — PREGABALIN 50 MG PO CAPS: 50.0000 mg | ORAL_CAPSULE | Freq: Three times a day (TID) | ORAL | 0 refills | Status: DC

## 2017-03-18 NOTE — Progress Notes (Signed)
Patient: Carrie Myers, Female    DOB: March 23, 1945, 72 y.o.   MRN: [id number] Visit Date: 03/18/2017  Today's Provider: Halina Maidens, MD   Chief Complaint  Patient presents with  . Medicare Wellness    Breast Exam.    Subjective:    Annual wellness visit Carrie Myers is a 72 y.o. female who presents today for her Subsequent Annual Wellness Visit. She feels poorly - complains of hurting all over. She reports exercising none. She reports she is sleeping well on CPAP. ----------------------------------------------------------- Hypertension  Pertinent negatives include no chest pain, headaches, palpitations or shortness of breath. Identifiable causes of hypertension include a thyroid problem.  Hyperlipidemia  Associated symptoms include myalgias. Pertinent negatives include no chest pain or shortness of breath.  Thyroid Problem  Patient reports no anxiety, constipation, diarrhea, fatigue, palpitations or tremors. Her past medical history is significant for hyperlipidemia.  Depression         Associated symptoms include myalgias.  Associated symptoms include no fatigue, no headaches and no suicidal ideas.  Past medical history includes thyroid problem.   OSA - using CPAP almost every night.  When she uses it she sleeps well and feels a bit more rested. Fibromyalgia - long standing pain that is worse recently.  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   DDD - seeing Dr. Andree Elk and having an injection next week.  She takes hydrocodone and tramadol routinely for pain.  Review of Systems  Constitutional: Negative for chills, fatigue, fever and unexpected weight change.  HENT: Positive for postnasal drip and sinus pressure. Negative for congestion, hearing loss, tinnitus, trouble swallowing and voice change.   Eyes: Negative for visual disturbance.  Respiratory: Negative for cough, chest tightness, shortness of breath and wheezing.   Cardiovascular:  Negative for chest pain, palpitations and leg swelling.  Gastrointestinal: Negative for abdominal pain, constipation, diarrhea and vomiting.  Endocrine: Negative for polydipsia and polyuria.  Genitourinary: Negative for dysuria, frequency, genital sores, vaginal bleeding and vaginal discharge.  Musculoskeletal: Positive for arthralgias, back pain and myalgias. Negative for gait problem and joint swelling.  Skin: Negative for color change and rash.  Allergic/Immunologic: Positive for environmental allergies.  Neurological: Negative for dizziness, tremors, weakness, light-headedness, numbness and headaches.  Hematological: Negative for adenopathy. Does not bruise/bleed easily.  Psychiatric/Behavioral: Positive for depression and dysphoric mood. Negative for sleep disturbance and suicidal ideas. The patient is not nervous/anxious.     Social History   Socioeconomic History  . Marital status: Widowed    Spouse name: Not on file  . Number of children: 1  . Years of education: Not on file  . Highest education level: Not on file  Social Needs  . Financial resource strain: Not hard at all  . Food insecurity - worry: Never true  . Food insecurity - inability: Never true  . Transportation needs - medical: No  . Transportation needs - non-medical: No  Occupational History  . Not on file  Tobacco Use  . Smoking status: Former Smoker    Types: Cigarettes  . Smokeless tobacco: Former Systems developer    Quit date: 04/22/2006  . Tobacco comment: quit 8 years  Substance and Sexual Activity  . Alcohol use: No    Alcohol/week: 0.0 oz  . Drug use: No  . Sexual activity: Not on file  Other Topics Concern  . Not on file  Social History Narrative  . Not on file    Patient Active Problem  List   Diagnosis Date Noted  . OSA on CPAP 02/18/2017  . Cervical disc disease 12/31/2016  . Generalized osteoarthritis 12/23/2016  . Chronic pain syndrome 12/23/2016  . Chronic low back pain 12/23/2016  . Cystocele,  midline 12/06/2016  . Urinary incontinence 12/06/2016  . Status post shoulder surgery 05/27/2015  . Impingement syndrome of right shoulder 04/17/2015  . Other shoulder lesions, right shoulder 04/17/2015  . Fibrocystic breast 01/18/2015  . Gastroesophageal reflux disease without esophagitis 01/18/2015  . Hypothyroidism, postablative 01/18/2015  . Fracture of right foot 01/14/2015  . Arteriosclerosis of coronary artery 12/29/2014  . DJD of shoulder 11/13/2014  . Fibromyalgia 10/02/2014  . Intercostal neuralgia 10/02/2014  . DDD (degenerative disc disease), lumbar 10/02/2014  . Facet syndrome, lumbar 10/02/2014  . Sacroiliac joint disease 10/02/2014  . Carotid artery narrowing 07/09/2014  . Benign essential HTN 07/01/2014  . Osteoarthritis of thumb 09/24/2012  . Mixed hyperlipidemia 09/22/2012  . Arthritis of hand, degenerative 09/22/2012    Past Surgical History:  Procedure Laterality Date  . ABDOMINAL HYSTERECTOMY     total  . APPENDECTOMY    . BREAST BIOPSY Right    neg  . BREAST CYST ASPIRATION Left    neg  . BUNIONECTOMY Bilateral   . CESAREAN SECTION    . COLONOSCOPY  2010   normal  . CORONARY ANGIOPLASTY WITH STENT PLACEMENT  04/2006  . dental implant     [age]  . epidural steroid injection  2018  . SHOULDER ARTHROSCOPY Right 05/07/2015   Procedure: Extensive arthroscopic debridement and arthroscopic subacromial decompression, right shoulder.  2. Steroid injection right thumb CMC joint.;  Surgeon: Corky Mull, MD;  Location: [facility];  Service: Orthopedics;  Laterality: Right;    Her family history includes Diabetes in her sister; Heart disease in her father; Pancreatic cancer in her sister; Prostate cancer in her brother; Stroke in her mother.     Current Meds  Medication Sig  . aspirin EC 81 MG tablet Take 81 mg by mouth daily.  . chlorhexidine (PERIDEX) 0.12 % solution RINSE 1/2 OUNCE BID FOR 30 SECONDS  . citalopram (CELEXA)  20 MG tablet TAKE 1 TABLET BY MOUTH  DAILY  . cyclobenzaprine (FLEXERIL) 10 MG tablet Take 1 tablet (10 mg total) by mouth 3 (three) times daily.  . diazepam (VALIUM) 5 MG tablet Limit  1/2  tablet by mouth per day or every other day if tolerated  . Diclofenac-Misoprostol 75-0.2 MG TBEC TAKE 1 TABLET BY MOUTH TWO  TIMES DAILY  . esomeprazole (NEXIUM) 20 MG capsule Take 40 mg by mouth daily. Over the Counter  . [START ON 03/27/2017] HYDROcodone-acetaminophen (NORCO/VICODIN) 5-325 MG tablet Take 1 tablet by mouth 2 (two) times daily at 10 AM and 5 PM. Limit 1-2 tablets by mouth per day if tolerated  . levothyroxine (SYNTHROID, LEVOTHROID) 88 MCG tablet TAKE 1 TABLET BY MOUTH  DAILY  . losartan (COZAAR) 50 MG tablet Take 50 mg by mouth daily.  . Multiple Vitamin (MULTI-VITAMINS) TABS Take by mouth.  . NON FORMULARY   . pravastatin (PRAVACHOL) 80 MG tablet Take 80 mg by mouth daily.  Marland Kitchen tolterodine (DETROL LA) 4 MG 24 hr capsule Take 1 capsule (4 mg total) by mouth daily.  . traMADol (ULTRAM) 50 MG tablet Take 1 tablet (50 mg total) by mouth every 6 (six) hours as needed.    Patient Care Team: [name], MD as PCP - General (Family Medicine) Hoyle Sauer, MD as Referring  Physician (Rheumatology) Corey Skains, MD as Consulting Physician (Cardiology) Mohammed Kindle, MD as Attending Physician (Pain Medicine) Molli Barrows, MD as Consulting Physician (Pain Medicine) Nori Riis, PA-C as Physician Assistant (Urology)       Objective:   Vitals: BP 127/70   Pulse 97   Ht 5\' 8"  (1.727 m)   Wt 186 lb (84.4 kg)   SpO2 96%   BMI 28.28 kg/m   Physical Exam  Constitutional: She is oriented to person, place, and time. She appears well-developed and well-nourished. No distress.  HENT:  Head: Normocephalic and atraumatic.  Right Ear: Tympanic membrane and ear canal normal.  Left Ear: Tympanic membrane and ear canal normal.  Nose: Right sinus exhibits no maxillary sinus  tenderness. Left sinus exhibits no maxillary sinus tenderness.  Mouth/Throat: Uvula is midline and oropharynx is clear and moist.  Eyes: Conjunctivae and EOM are normal. Right eye exhibits no discharge. Left eye exhibits no discharge. No scleral icterus.  Neck: Normal range of motion. Carotid bruit is not present. No erythema present. No thyromegaly present.  Cardiovascular: Normal rate, regular rhythm, normal heart sounds and normal pulses.  Pulmonary/Chest: Effort normal. No respiratory distress. She has no wheezes. She exhibits tenderness (multiple soft tissue regions). Right breast exhibits no mass, no nipple discharge, no skin change and no tenderness. Left breast exhibits no mass, no nipple discharge, no skin change and no tenderness.  Abdominal: Soft. Bowel sounds are normal. There is no hepatosplenomegaly. There is no tenderness. There is no CVA tenderness.  Musculoskeletal: Normal range of motion.  Tender around elbows, upper back and knees OA changes of fingers on both hands  Lymphadenopathy:    She has no cervical adenopathy.    She has no axillary adenopathy.  Neurological: She is alert and oriented to person, place, and time. She has normal reflexes. No cranial nerve deficit or sensory deficit.  Skin: Skin is warm, dry and intact. No rash noted.  Psychiatric: She has a normal mood and affect. Her speech is normal and behavior is normal. Thought content normal.  Nursing note and vitals reviewed.   Activities of Daily Living In your present state of health, do you have any difficulty performing the following activities: 03/18/2017 03/18/2017  Hearing? N N  Vision? N N  Difficulty concentrating or making decisions? N N  Walking or climbing stairs? N N  Dressing or bathing? N N  Doing errands, shopping? N N  Preparing Food and eating ? N -  Using the Toilet? N -  In the past six months, have you accidently leaked urine? Y -  Do you have problems with loss of bowel control? N -    Managing your Medications? N -  Managing your Finances? N -  Housekeeping or managing your Housekeeping? N -  Some recent data might be hidden    Fall Risk Assessment Fall Risk  03/18/2017 02/25/2017 12/22/2016 11/17/2016 09/07/2016  Falls in the past year? Yes No No No No  Comment - - - - -  Number falls in past yr: 1 - - - -  Comment - - - - -  Injury with Fall? No - - - -  Risk for fall due to : History of fall(s) - - - -  Follow up Falls evaluation completed - - - -     Depression Screen PHQ 2/9 Scores 03/18/2017 02/25/2017 12/22/2016 11/17/2016  PHQ - 2 Score 0 0 0 0  PHQ- 9 Score - - - -  Exception Documentation - - - -    6CIT Screen 03/18/2017 03/16/2016  What Year? 0 points 0 points  What month? 0 points 0 points  What time? 0 points 0 points  Count back from 20 0 points 0 points  Months in reverse 0 points 0 points  Repeat phrase 2 points 0 points  Total Score 2 0    Medicare Annual Wellness Visit Summary:  Reviewed patient's Family Medical History Reviewed and updated list of patient's medical providers Assessment of cognitive impairment was done Assessed patient's functional ability Established a written schedule for health screening [place] Completed and Reviewed  Exercise Activities and Dietary recommendations Goals    . Weight (lb) < 200 lb (90.7 kg)       Immunization History  Administered Date(s) Administered  . Influenza, High Dose Seasonal PF 12/31/2016  . Pneumococcal Conjugate-13 01/30/2014  . Pneumococcal Polysaccharide-23 09/22/2012  . Tdap 09/08/2011    Health Maintenance  Topic Date Due  . DEXA SCAN  04/26/2012  . COLONOSCOPY  03/16/2018  . MAMMOGRAM  03/23/2018  . TETANUS/TDAP  09/07/2021  . INFLUENZA VACCINE  Completed  . Hepatitis C Screening  Completed  . PNA vac Low Risk Adult  Completed    Discussed health benefits of physical activity, and encouraged her to engage in regular exercise appropriate for her  age and condition.    ------------------------------------------------------------------------------------------------------------  Assessment & Plan:   1. Medicare annual wellness visit, subsequent Measures satisfied  2. Benign essential HTN controlled - CBC with Differential/Platelet - Comprehensive metabolic panel - POCT urinalysis dipstick  3. OSA on CPAP Doing well  4. Gastroesophageal reflux disease without esophagitis controlled - CBC with Differential/Platelet  5. Hypothyroidism, postablative supplemented - TSH  6. DDD (degenerative disc disease), lumbar Continue with pain management  7. Mixed hyperlipidemia On statin therapy - Lipid panel  8. Fibromyalgia Add Lyrica - pregabalin (LYRICA) 50 MG capsule; Take 1 capsule (50 mg total) by mouth 3 (three) times daily.  Dispense: 84 capsule; Refill: 0   Meds ordered this encounter  Medications  . pregabalin (LYRICA) 50 MG capsule    Sig: Take 1 capsule (50 mg total) by mouth 3 (three) times daily.    Dispense:  84 capsule    Refill:  0    Partially dictated using Editor, commissioning. Any errors are unintentional.  Halina Maidens, MD [facility]  03/18/2017

## 2017-03-18 NOTE — Patient Instructions (Signed)
Lyrica 50 mg twice a day for 4 days then increase to three times per day.  Health Maintenance  Topic Date Due  . DEXA SCAN  04/26/2012  . MAMMOGRAM  03/23/2017  . COLONOSCOPY  03/16/2018  . TETANUS/TDAP  09/07/2021  . INFLUENZA VACCINE  Completed  . Hepatitis C Screening  Completed  . PNA vac Low Risk Adult  Completed

## 2017-03-19 LAB — TSH: TSH: 4.73 u[IU]/mL — ABNORMAL HIGH (ref 0.450–4.500)

## 2017-03-19 LAB — COMPREHENSIVE METABOLIC PANEL
ALBUMIN: 4.7 g/dL (ref 3.6–4.8)
ALK PHOS: 88 IU/L (ref 39–117)
ALT: 58 IU/L — ABNORMAL HIGH (ref 0–32)
AST: 53 IU/L — AB (ref 0–40)
Albumin/Globulin Ratio: 1.6 (ref 1.2–2.2)
BUN / CREAT RATIO: 22 (ref 12–28)
BUN: 19 mg/dL (ref 8–27)
Bilirubin Total: 0.3 mg/dL (ref 0.0–1.2)
CO2: 24 mmol/L (ref 20–29)
CREATININE: 0.87 mg/dL (ref 0.57–1.00)
Calcium: 9.9 mg/dL (ref 8.7–10.3)
Chloride: 103 mmol/L (ref 96–106)
GFR calc Af Amer: 79 mL/min/{1.73_m2} (ref >59)
GFR calc non Af Amer: 68 mL/min/{1.73_m2} (ref >59)
GLOBULIN, TOTAL: 2.9 g/dL (ref 1.5–4.5)
Glucose: 97 mg/dL (ref 65–99)
POTASSIUM: 4.3 mmol/L (ref 3.5–5.2)
SODIUM: 145 mmol/L — AB (ref 134–144)
Total Protein: 7.6 g/dL (ref 6.0–8.5)

## 2017-03-19 LAB — CBC WITH DIFFERENTIAL/PLATELET
BASOS: 1 %
Basophils Absolute: 0 10*3/uL (ref 0.0–0.2)
EOS (ABSOLUTE): 0.2 10*3/uL (ref 0.0–0.4)
EOS: 3 %
HEMATOCRIT: 39.4 % (ref 34.0–46.6)
HEMOGLOBIN: 13.4 g/dL (ref 11.1–15.9)
Immature Grans (Abs): 0 10*3/uL (ref 0.0–0.1)
Immature Granulocytes: 0 %
LYMPHS ABS: 1.2 10*3/uL (ref 0.7–3.1)
Lymphs: 20 %
MCH: 29.6 pg (ref 26.6–33.0)
MCHC: 34 g/dL (ref 31.5–35.7)
MCV: 87 fL (ref 79–97)
MONOCYTES: 11 %
MONOS ABS: 0.7 10*3/uL (ref 0.1–0.9)
Neutrophils Absolute: 3.9 10*3/uL (ref 1.4–7.0)
Neutrophils: 65 %
Platelets: 223 10*3/uL (ref 150–379)
RBC: 4.53 x10E6/uL (ref 3.77–5.28)
RDW: 13.6 % (ref 12.3–15.4)
WBC: 6 10*3/uL (ref 3.4–10.8)

## 2017-03-19 LAB — LIPID PANEL
CHOL/HDL RATIO: 2.9 ratio (ref 0.0–4.4)
CHOLESTEROL TOTAL: 122 mg/dL (ref 100–199)
HDL: 42 mg/dL (ref >39)
LDL CALC: 47 mg/dL (ref 0–99)
TRIGLYCERIDES: 163 mg/dL — AB (ref 0–149)
VLDL Cholesterol Cal: 33 mg/dL (ref 5–40)

## 2017-03-22 ENCOUNTER — Ambulatory Visit (INDEPENDENT_AMBULATORY_CARE_PROVIDER_SITE_OTHER): Payer: Medicare Other | Admitting: Internal Medicine

## 2017-03-22 ENCOUNTER — Ambulatory Visit: Payer: Medicare Other | Admitting: Internal Medicine

## 2017-03-22 ENCOUNTER — Encounter: Payer: Self-pay | Admitting: Internal Medicine

## 2017-03-22 VITALS — BP 110/78 | HR 62 | Temp 97.5°F | Ht 68.0 in | Wt 189.0 lb

## 2017-03-22 DIAGNOSIS — H6690 Otitis media, unspecified, unspecified ear: Secondary | ICD-10-CM

## 2017-03-22 DIAGNOSIS — J40 Bronchitis, not specified as acute or chronic: Secondary | ICD-10-CM

## 2017-03-22 MED ORDER — BENZONATATE 200 MG PO CAPS: 200.0000 mg | ORAL_CAPSULE | Freq: Three times a day (TID) | ORAL | 0 refills | Status: AC

## 2017-03-22 MED ORDER — LEVOFLOXACIN 500 MG PO TABS: 500.0000 mg | ORAL_TABLET | Freq: Every day | ORAL | 0 refills | Status: AC

## 2017-03-22 NOTE — Patient Instructions (Signed)
Resume flonase nasal spray.

## 2017-03-22 NOTE — Progress Notes (Signed)
Date:  03/22/2017   Name:  Carrie Myers   DOB:  [birth year]   MRN:  [id number]   Chief Complaint: Sore Throat (Been sick for a week now. Throat still very sore. Chest congestion- brown production when coughing, ) Cough  This is a new problem. The current episode started in the past 7 days. The problem has been rapidly worsening. The cough is productive of sputum and productive of brown sputum. Associated symptoms include a sore throat. Pertinent negatives include no chest pain, chills, fever, postnasal drip or wheezing.      Review of Systems  Constitutional: Negative for chills, diaphoresis and fever.  HENT: Positive for congestion and sore throat. Negative for postnasal drip and sinus pressure.   Respiratory: Positive for cough. Negative for chest tightness and wheezing.   Cardiovascular: Negative for chest pain and palpitations.    Patient Active Problem List   Diagnosis Date Noted  . OSA on CPAP 02/18/2017  . Cervical disc disease 12/31/2016  . Generalized osteoarthritis 12/23/2016  . Chronic pain syndrome 12/23/2016  . Chronic low back pain 12/23/2016  . Cystocele, midline 12/06/2016  . Urinary incontinence 12/06/2016  . Status post shoulder surgery 05/27/2015  . Impingement syndrome of right shoulder 04/17/2015  . Other shoulder lesions, right shoulder 04/17/2015  . Fibrocystic breast 01/18/2015  . Gastroesophageal reflux disease without esophagitis 01/18/2015  . Hypothyroidism, postablative 01/18/2015  . Fracture of right foot 01/14/2015  . Arteriosclerosis of coronary artery 12/29/2014  . DJD of shoulder 11/13/2014  . Fibromyalgia 10/02/2014  . Intercostal neuralgia 10/02/2014  . DDD (degenerative disc disease), lumbar 10/02/2014  . Facet syndrome, lumbar 10/02/2014  . Sacroiliac joint disease 10/02/2014  . Carotid artery narrowing 07/09/2014  . Benign essential HTN 07/01/2014  . Osteoarthritis of thumb 09/24/2012  . Mixed hyperlipidemia 09/22/2012  .  Arthritis of hand, degenerative 09/22/2012    Prior to Admission medications   Medication Sig Start Date End Date Taking? Authorizing Provider  aspirin EC 81 MG tablet Take 81 mg by mouth daily.   Yes [provider]  chlorhexidine (PERIDEX) 0.12 % solution RINSE 1/2 OUNCE BID FOR 30 SECONDS 12/15/16  Yes [provider]  citalopram (CELEXA) 20 MG tablet TAKE 1 TABLET BY MOUTH  DAILY 01/18/16  Yes [name], MD  cyclobenzaprine (FLEXERIL) 10 MG tablet Take 1 tablet (10 mg total) by mouth 3 (three) times daily. 02/18/17  Yes [name], MD  diazepam (VALIUM) 5 MG tablet Limit  1/2  tablet by mouth per day or every other day if tolerated 02/25/17  Yes Vevelyn Francois, NP  Diclofenac-Misoprostol 75-0.2 MG TBEC TAKE 1 TABLET BY MOUTH TWO  TIMES DAILY 12/29/16  Yes [name], MD  esomeprazole (NEXIUM) 20 MG capsule Take 40 mg by mouth daily. Over the Counter   Yes [provider]  HYDROcodone-acetaminophen (NORCO/VICODIN) 5-325 MG tablet Take 1 tablet by mouth 2 (two) times daily at 10 AM and 5 PM. Limit 1-2 tablets by mouth per day if tolerated 03/27/17 04/26/17 Yes King, Diona Foley, NP  levothyroxine (SYNTHROID, LEVOTHROID) 88 MCG tablet TAKE 1 TABLET BY MOUTH  DAILY 02/21/17  Yes [name], MD  losartan (COZAAR) 50 MG tablet Take 50 mg by mouth daily.   Yes [provider]  Multiple Vitamin (MULTI-VITAMINS) TABS Take by mouth.   Yes [provider]  NON FORMULARY    Yes [provider]  pravastatin (PRAVACHOL) 80 MG tablet Take 80 mg  by mouth daily.   Yes [provider]  pregabalin (LYRICA) 50 MG capsule Take 1 capsule (50 mg total) by mouth 3 (three) times daily. 03/18/17  Yes [name], MD  tolterodine (DETROL LA) 4 MG 24 hr capsule Take 1 capsule (4 mg total) by mouth daily. 01/05/17  Yes Gae Dry, MD  traMADol (ULTRAM) 50 MG tablet Take 1 tablet (50 mg total) by mouth every 6 (six) hours as  needed. 02/25/17  Yes Vevelyn Francois, NP    Allergies  Allergen Reactions  . Augmentin [Amoxicillin-Pot Clavulanate] Nausea And Vomiting  . Codeine Nausea And Vomiting  . Sulfa Antibiotics Nausea And Vomiting  . Shellfish Allergy Nausea And Vomiting, Nausea Only and Other (See Comments)    No issues with iodine     Past Surgical History:  Procedure Laterality Date  . ABDOMINAL HYSTERECTOMY     total  . APPENDECTOMY    . BREAST BIOPSY Right    neg  . BREAST CYST ASPIRATION Left    neg  . BUNIONECTOMY Bilateral   . CESAREAN SECTION    . COLONOSCOPY  2010   normal  . CORONARY ANGIOPLASTY WITH STENT PLACEMENT  04/2006  . dental implant     [age]  . epidural steroid injection  2018  . SHOULDER ARTHROSCOPY Right 05/07/2015   Procedure: Extensive arthroscopic debridement and arthroscopic subacromial decompression, right shoulder.  2. Steroid injection right thumb CMC joint.;  Surgeon: Corky Mull, MD;  Location: [facility];  Service: Orthopedics;  Laterality: Right;    Social History   Tobacco Use  . Smoking status: Former Smoker    Types: Cigarettes  . Smokeless tobacco: Former Systems developer    Quit date: 04/22/2006  . Tobacco comment: quit 8 years  Substance Use Topics  . Alcohol use: No    Alcohol/week: 0.0 oz  . Drug use: No     Medication list has been reviewed and updated.  PHQ 2/9 Scores 03/18/2017 02/25/2017 12/22/2016 11/17/2016  PHQ - 2 Score 0 0 0 0  PHQ- 9 Score - - - -  Exception Documentation - - - -    Physical Exam  Constitutional: She is oriented to person, place, and time. She appears well-developed. No distress.  HENT:  Head: Normocephalic and atraumatic.  Right Ear: Tympanic membrane and ear canal normal.  Left Ear: Tympanic membrane is erythematous and retracted.  Nose: Right sinus exhibits no maxillary sinus tenderness. Left sinus exhibits no maxillary sinus tenderness.  Mouth/Throat: Posterior oropharyngeal erythema  present. No oropharyngeal exudate or posterior oropharyngeal edema.  Cardiovascular: Normal rate, regular rhythm and normal heart sounds.  Pulmonary/Chest: Effort normal. No respiratory distress. She has decreased breath sounds. She has no wheezes. She has rhonchi (upper lungs).  Musculoskeletal: Normal range of motion.  Neurological: She is alert and oriented to person, place, and time.  Skin: Skin is warm and dry. No rash noted.  Psychiatric: She has a normal mood and affect. Her behavior is normal. Thought content normal.  Nursing note and vitals reviewed.   BP 110/78   Pulse 62   Temp (!) 97.5 F (36.4 C) (Oral)   Ht 5\' 8"  (1.727 m)   Wt 189 lb (85.7 kg)   BMI 28.74 kg/m   Assessment and Plan: 1. Bronchitis - levofloxacin (LEVAQUIN) 500 MG tablet; Take 1 tablet (500 mg total) by mouth daily for 10 days.  Dispense: 10 tablet; Refill: 0 - benzonatate (TESSALON) 200 MG capsule; Take 1 capsule (  200 mg total) by mouth 3 (three) times daily for 10 days.  Dispense: 30 capsule; Refill: 0  2. Acute otitis media, unspecified otitis media type Resume Flonase NS for congestion   Meds ordered this encounter  Medications  . levofloxacin (LEVAQUIN) 500 MG tablet    Sig: Take 1 tablet (500 mg total) by mouth daily for 10 days.    Dispense:  10 tablet    Refill:  0  . benzonatate (TESSALON) 200 MG capsule    Sig: Take 1 capsule (200 mg total) by mouth 3 (three) times daily for 10 days.    Dispense:  30 capsule    Refill:  0    Partially dictated using Editor, commissioning. Any errors are unintentional.  Halina Maidens, MD [facility]  03/22/2017

## 2017-03-24 ENCOUNTER — Ambulatory Visit: Payer: Medicare Other | Attending: Anesthesiology | Admitting: Anesthesiology

## 2017-03-24 ENCOUNTER — Other Ambulatory Visit: Payer: Self-pay

## 2017-03-24 ENCOUNTER — Encounter: Payer: Self-pay | Admitting: Anesthesiology

## 2017-03-24 VITALS — BP 114/85 | HR 82 | Temp 97.8°F | Resp 16 | Ht 68.0 in | Wt 190.0 lb

## 2017-03-24 DIAGNOSIS — G894 Chronic pain syndrome: Secondary | ICD-10-CM | POA: Insufficient documentation

## 2017-03-24 DIAGNOSIS — M545 Low back pain: Secondary | ICD-10-CM | POA: Diagnosis not present

## 2017-03-24 DIAGNOSIS — G8929 Other chronic pain: Secondary | ICD-10-CM | POA: Diagnosis not present

## 2017-03-24 DIAGNOSIS — M5441 Lumbago with sciatica, right side: Secondary | ICD-10-CM | POA: Diagnosis not present

## 2017-03-24 DIAGNOSIS — M47816 Spondylosis without myelopathy or radiculopathy, lumbar region: Secondary | ICD-10-CM

## 2017-03-24 DIAGNOSIS — Z79891 Long term (current) use of opiate analgesic: Secondary | ICD-10-CM | POA: Diagnosis not present

## 2017-03-24 DIAGNOSIS — M5442 Lumbago with sciatica, left side: Secondary | ICD-10-CM | POA: Diagnosis not present

## 2017-03-24 DIAGNOSIS — M797 Fibromyalgia: Secondary | ICD-10-CM | POA: Diagnosis not present

## 2017-03-24 DIAGNOSIS — M5136 Other intervertebral disc degeneration, lumbar region: Secondary | ICD-10-CM | POA: Insufficient documentation

## 2017-03-24 DIAGNOSIS — Z79899 Other long term (current) drug therapy: Secondary | ICD-10-CM | POA: Insufficient documentation

## 2017-03-24 DIAGNOSIS — M5432 Sciatica, left side: Secondary | ICD-10-CM | POA: Diagnosis not present

## 2017-03-24 DIAGNOSIS — Z7982 Long term (current) use of aspirin: Secondary | ICD-10-CM | POA: Insufficient documentation

## 2017-03-24 DIAGNOSIS — J209 Acute bronchitis, unspecified: Secondary | ICD-10-CM | POA: Insufficient documentation

## 2017-03-24 DIAGNOSIS — M533 Sacrococcygeal disorders, not elsewhere classified: Secondary | ICD-10-CM

## 2017-03-24 NOTE — Patient Instructions (Signed)
Epidural Steroid Injection Patient Information  Description: The epidural space surrounds the nerves as they exit the spinal cord.  In some patients, the nerves can be compressed and inflamed by a bulging disc or a tight spinal canal (spinal stenosis).  By injecting steroids into the epidural space, we can bring irritated nerves into direct contact with a potentially helpful medication.  These steroids act directly on the irritated nerves and can reduce swelling and inflammation which often leads to decreased pain.  Epidural steroids may be injected anywhere along the spine and from the neck to the low back depending upon the location of your pain.   After numbing the skin with local anesthetic (like Novocaine), a small needle is passed into the epidural space slowly.  You may experience a sensation of pressure while this is being done.  The entire block usually last less than 10 minutes.  Conditions which may be treated by epidural steroids:   Low back and leg pain  Neck and arm pain  Spinal stenosis  Post-laminectomy syndrome  Herpes zoster (shingles) pain  Pain from compression fractures  Preparation for the injection:  1. Do not eat any solid food or dairy products within 8 hours of your appointment.  2. You may drink clear liquids up to 3 hours before appointment.  Clear liquids include water, black coffee, juice or soda.  No milk or cream please. 3. You may take your regular medication, including pain medications, with a sip of water before your appointment  Diabetics should hold regular insulin (if taken separately) and take 1/2 normal NPH dos the morning of the procedure.  Carry some sugar containing items with you to your appointment. 4. A driver must accompany you and be prepared to drive you home after your procedure.  5. Bring all your current medications with your. 6. An IV may be inserted and sedation may be given at the discretion of the physician.   7. A blood pressure  cuff, EKG and other monitors will often be applied during the procedure.  Some patients may need to have extra oxygen administered for a short period. 8. You will be asked to provide medical information, including your allergies, prior to the procedure.  We must know immediately if you are taking blood thinners (like Coumadin/Warfarin)  Or if you are allergic to IV iodine contrast (dye). We must know if you could possible be pregnant.  Possible side-effects:  Bleeding from needle site  Infection (rare, may require surgery)  Nerve injury (rare)  Numbness & tingling (temporary)  Difficulty urinating (rare, temporary)  Spinal headache ( a headache worse with upright posture)  Light -headedness (temporary)  Pain at injection site (several days)  Decreased blood pressure (temporary)  Weakness in arm/leg (temporary)  Pressure sensation in back/neck (temporary)  Call if you experience:  Fever/chills associated with headache or increased back/neck pain.  Headache worsened by an upright position.  New onset weakness or numbness of an extremity below the injection site  Hives or difficulty breathing (go to the emergency room)  Inflammation or drainage at the infection site  Severe back/neck pain  Any new symptoms which are concerning to you  Please note:  Although the local anesthetic injected can often make your back or neck feel good for several hours after the injection, the pain will likely return.  It takes 3-7 days for steroids to work in the epidural space.  You may not notice any pain relief for at least that one week.    If effective, we will often do a series of three injections spaced 3-6 weeks apart to maximally decrease your pain.  After the initial series, we generally will wait several months before considering a repeat injection of the same type.  If you have any questions, please call [phone] [facility] 

## 2017-03-24 NOTE — Progress Notes (Signed)
Safety precautions to be maintained throughout the outpatient stay will include: orient to surroundings, keep bed in low position, maintain call bell within reach at all times, provide assistance with transfer out of bed and ambulation.  

## 2017-03-27 NOTE — Progress Notes (Signed)
Subjective:  Patient ID: Carrie Myers, female    DOB: Mar 29, 1945  Age: 72 y.o. MRN: [id number]  CC: Back Pain (left, lwoer)   Procedure: None  HPI Carrie Myers presents for evaluation.  She was scheduled for a procedure today however has had a recent bronchitis.  She has had some upper respiratory infections with a productive cough.  She is currently just initiating an antibiotic trial.  She is still feeling poorly with limited energy.  In regards to her pain symptom complex; there is little change reported.  She desires to proceed with an injection as soon as possible.  Outpatient Medications Prior to Visit  Medication Sig Dispense Refill  . aspirin EC 81 MG tablet Take 81 mg by mouth daily.    . benzonatate (TESSALON) 200 MG capsule Take 1 capsule (200 mg total) by mouth 3 (three) times daily for 10 days. 30 capsule 0  . citalopram (CELEXA) 20 MG tablet TAKE 1 TABLET BY MOUTH  DAILY 90 tablet 3  . cyclobenzaprine (FLEXERIL) 10 MG tablet Take 1 tablet (10 mg total) by mouth 3 (three) times daily. 270 tablet 0  . diazepam (VALIUM) 5 MG tablet Limit  1/2  tablet by mouth per day or every other day if tolerated 15 tablet 1  . Diclofenac-Misoprostol 75-0.2 MG TBEC TAKE 1 TABLET BY MOUTH TWO  TIMES DAILY 180 tablet 1  . esomeprazole (NEXIUM) 20 MG capsule Take 40 mg by mouth daily. Over the Counter    . HYDROcodone-acetaminophen (NORCO/VICODIN) 5-325 MG tablet Take 1 tablet by mouth 2 (two) times daily at 10 AM and 5 PM. Limit 1-2 tablets by mouth per day if tolerated 30 tablet 0  . levofloxacin (LEVAQUIN) 500 MG tablet Take 1 tablet (500 mg total) by mouth daily for 10 days. 10 tablet 0  . levothyroxine (SYNTHROID, LEVOTHROID) 88 MCG tablet TAKE 1 TABLET BY MOUTH  DAILY 90 tablet 3  . losartan (COZAAR) 50 MG tablet Take 50 mg by mouth daily.    . Multiple Vitamin (MULTI-VITAMINS) TABS Take by mouth.    . NON FORMULARY     . pravastatin (PRAVACHOL) 80 MG tablet Take 80 mg by mouth  daily.    . pregabalin (LYRICA) 50 MG capsule Take 1 capsule (50 mg total) by mouth 3 (three) times daily. 84 capsule 0  . tolterodine (DETROL LA) 4 MG 24 hr capsule Take 1 capsule (4 mg total) by mouth daily. 30 capsule 11  . traMADol (ULTRAM) 50 MG tablet Take 1 tablet (50 mg total) by mouth every 6 (six) hours as needed. 90 tablet 1  . chlorhexidine (PERIDEX) 0.12 % solution RINSE 1/2 OUNCE BID FOR 30 SECONDS  0   No facility-administered medications prior to visit.     Review of Systems CNS: No confusion or sedation Cardiac: No angina or palpitations GI: No abdominal pain or constipation Constitutional: No nausea vomiting but positive chills and recent fevers though afebrile today currently on antibiotic coverage  Objective:  BP 114/85   Pulse 82   Temp 97.8 F (36.6 C) (Oral)   Resp 16   Ht 5\' 8"  (1.727 m)   Wt 190 lb (86.2 kg)   SpO2 99%   BMI 28.89 kg/m    BP Readings from Last 3 Encounters:  03/24/17 114/85  03/22/17 110/78  03/18/17 127/70     Wt Readings from Last 3 Encounters:  03/24/17 190 lb (86.2 kg)  03/22/17 189 lb (85.7 kg)  03/18/17 186  lb (84.4 kg)     Physical Exam Pt is alert and oriented PERRL EOMI HEART IS RRR no murmur or rub LCTA no wheezing or rales   Labs  No results found for: HGBA1C Lab Results  Component Value Date   LDLCALC 47 03/18/2017   CREATININE 0.87 03/18/2017    -------------------------------------------------------------------------------------------------------------------- Lab Results  Component Value Date   WBC 6.0 03/18/2017   HGB 13.4 03/18/2017   HCT 39.4 03/18/2017   PLT 223 03/18/2017   GLUCOSE 97 03/18/2017   CHOL 122 03/18/2017   TRIG 163 (H) 03/18/2017   HDL 42 03/18/2017   LDLCALC 47 03/18/2017   ALT 58 (H) 03/18/2017   AST 53 (H) 03/18/2017   NA 145 (H) 03/18/2017   K 4.3 03/18/2017   CL 103 03/18/2017   CREATININE 0.87 03/18/2017   BUN 19 03/18/2017   CO2 24 03/18/2017   TSH 4.730 (H)  03/18/2017    --------------------------------------------------------------------------------------------------------------------- Dg C-arm 1-60 Min-no Report  Result Date: 11/17/2016 Fluoroscopy was utilized by the requesting physician.  No radiographic interpretation.     Assessment & Plan:   Carrie Myers was seen today for back pain.  Diagnoses and all orders for this visit:  Chronic bilateral low back pain with bilateral sciatica  Sacroiliac joint disease  DDD (degenerative disc disease), lumbar  Chronic pain syndrome  Chronic bilateral low back pain without sciatica  Facet syndrome, lumbar  Sciatica of left side  Fibromyalgia  Acute bronchitis, unspecified organism        ----------------------------------------------------------------------------------------------------------------------  Problem List Items Addressed This Visit      Unprioritized   Chronic low back pain - Primary (Chronic)   Chronic pain syndrome (Chronic)   DDD (degenerative disc disease), lumbar (Chronic)   Facet syndrome, lumbar   Fibromyalgia   Sacroiliac joint disease (Chronic)    Other Visit Diagnoses    Sciatica of left side       Acute bronchitis, unspecified organism            ----------------------------------------------------------------------------------------------------------------------  1. Chronic bilateral low back pain with bilateral sciatica Were going to reschedule her for return to clinic in a week.  Hopefully at that point she will be feeling better as she does appear to be feeling sickly today.  Want her to continue her current medication management and current core strengthening protocol  2. Sacroiliac joint disease As above  3. DDD (degenerative disc disease), lumbar As above  4. Chronic pain syndrome   5. Chronic bilateral low back pain without sciatica   6. Facet syndrome, lumbar   7. Sciatica of left side   8. Fibromyalgia   9. Acute  bronchitis, unspecified organism Continue antibiotic coverage with return to clinic as mentioned    ----------------------------------------------------------------------------------------------------------------------  I am having Carrie Myers maintain her esomeprazole, losartan, pravastatin, citalopram, aspirin EC, chlorhexidine, Diclofenac-Misoprostol, tolterodine, MULTI-VITAMINS, NON FORMULARY, cyclobenzaprine, levothyroxine, diazepam, traMADol, HYDROcodone-acetaminophen, pregabalin, levofloxacin, and benzonatate.   No orders of the defined types were placed in this encounter.  Patient's Medications  New Prescriptions   No medications on file  Previous Medications   ASPIRIN EC 81 MG TABLET    Take 81 mg by mouth daily.   BENZONATATE (TESSALON) 200 MG CAPSULE    Take 1 capsule (200 mg total) by mouth 3 (three) times daily for 10 days.   CHLORHEXIDINE (PERIDEX) 0.12 % SOLUTION    RINSE 1/2 OUNCE BID FOR 30 SECONDS   CITALOPRAM (CELEXA) 20 MG TABLET    TAKE 1 TABLET  BY MOUTH  DAILY   CYCLOBENZAPRINE (FLEXERIL) 10 MG TABLET    Take 1 tablet (10 mg total) by mouth 3 (three) times daily.   DIAZEPAM (VALIUM) 5 MG TABLET    Limit  1/2  tablet by mouth per day or every other day if tolerated   DICLOFENAC-MISOPROSTOL 75-0.2 MG TBEC    TAKE 1 TABLET BY MOUTH TWO  TIMES DAILY   ESOMEPRAZOLE (NEXIUM) 20 MG CAPSULE    Take 40 mg by mouth daily. Over the Counter   HYDROCODONE-ACETAMINOPHEN (NORCO/VICODIN) 5-325 MG TABLET    Take 1 tablet by mouth 2 (two) times daily at 10 AM and 5 PM. Limit 1-2 tablets by mouth per day if tolerated   LEVOFLOXACIN (LEVAQUIN) 500 MG TABLET    Take 1 tablet (500 mg total) by mouth daily for 10 days.   LEVOTHYROXINE (SYNTHROID, LEVOTHROID) 88 MCG TABLET    TAKE 1 TABLET BY MOUTH  DAILY   LOSARTAN (COZAAR) 50 MG TABLET    Take 50 mg by mouth daily.   MULTIPLE VITAMIN (MULTI-VITAMINS) TABS    Take by mouth.   NON FORMULARY       PRAVASTATIN (PRAVACHOL) 80 MG  TABLET    Take 80 mg by mouth daily.   PREGABALIN (LYRICA) 50 MG CAPSULE    Take 1 capsule (50 mg total) by mouth 3 (three) times daily.   TOLTERODINE (DETROL LA) 4 MG 24 HR CAPSULE    Take 1 capsule (4 mg total) by mouth daily.   TRAMADOL (ULTRAM) 50 MG TABLET    Take 1 tablet (50 mg total) by mouth every 6 (six) hours as needed.  Modified Medications   No medications on file  Discontinued Medications   No medications on file   ----------------------------------------------------------------------------------------------------------------------  Follow-up: Return in about 7 days (around 03/31/2017) for evaluation, procedure.    Molli Barrows, MD

## 2017-03-30 ENCOUNTER — Ambulatory Visit: Payer: Medicare Other | Admitting: Anesthesiology

## 2017-03-30 ENCOUNTER — Telehealth: Payer: Self-pay

## 2017-03-30 NOTE — Telephone Encounter (Signed)
Patient Left Message stating that she is still sick. Was seen 03/22/2017. Dx Bronchitis and is on last day of Abx. Advised if no fever, no wheezing, no SOB, and no other changes then try to wait 10 days after Abx. If anything changes needs to be seen. Patient reports no changes same symptoms and understood instructions.

## 2017-03-30 NOTE — Telephone Encounter (Signed)
She needs to finish the antibiotics if she not worse.

## 2017-04-04 ENCOUNTER — Ambulatory Visit: Payer: Medicare Other | Admitting: Obstetrics & Gynecology

## 2017-04-14 ENCOUNTER — Ambulatory Visit
Admission: RE | Admit: 2017-04-14 | Discharge: 2017-04-14 | Disposition: A | Discharge status: Home / Self Care | Payer: Medicare Other | Source: Ambulatory Visit | Attending: Anesthesiology | Admitting: Anesthesiology

## 2017-04-14 ENCOUNTER — Ambulatory Visit (HOSPITAL_BASED_OUTPATIENT_CLINIC_OR_DEPARTMENT_OTHER): Payer: Medicare Other | Admitting: Anesthesiology

## 2017-04-14 ENCOUNTER — Encounter: Payer: Self-pay | Admitting: Anesthesiology

## 2017-04-14 ENCOUNTER — Other Ambulatory Visit: Payer: Self-pay | Admitting: Anesthesiology

## 2017-04-14 ENCOUNTER — Other Ambulatory Visit: Payer: Self-pay

## 2017-04-14 VITALS — BP 138/76 | HR 88 | Temp 98.0°F | Resp 16 | Ht 68.0 in | Wt 190.0 lb

## 2017-04-14 DIAGNOSIS — M47816 Spondylosis without myelopathy or radiculopathy, lumbar region: Secondary | ICD-10-CM

## 2017-04-14 DIAGNOSIS — Z7982 Long term (current) use of aspirin: Secondary | ICD-10-CM | POA: Diagnosis not present

## 2017-04-14 DIAGNOSIS — Z79899 Other long term (current) drug therapy: Secondary | ICD-10-CM | POA: Insufficient documentation

## 2017-04-14 DIAGNOSIS — R52 Pain, unspecified: Secondary | ICD-10-CM

## 2017-04-14 DIAGNOSIS — M159 Polyosteoarthritis, unspecified: Secondary | ICD-10-CM | POA: Insufficient documentation

## 2017-04-14 DIAGNOSIS — M5441 Lumbago with sciatica, right side: Secondary | ICD-10-CM

## 2017-04-14 DIAGNOSIS — M797 Fibromyalgia: Secondary | ICD-10-CM | POA: Insufficient documentation

## 2017-04-14 DIAGNOSIS — Z79891 Long term (current) use of opiate analgesic: Secondary | ICD-10-CM | POA: Diagnosis not present

## 2017-04-14 DIAGNOSIS — G8929 Other chronic pain: Secondary | ICD-10-CM

## 2017-04-14 DIAGNOSIS — G894 Chronic pain syndrome: Secondary | ICD-10-CM | POA: Diagnosis not present

## 2017-04-14 DIAGNOSIS — M5442 Lumbago with sciatica, left side: Secondary | ICD-10-CM | POA: Insufficient documentation

## 2017-04-14 DIAGNOSIS — M5136 Other intervertebral disc degeneration, lumbar region: Secondary | ICD-10-CM

## 2017-04-14 DIAGNOSIS — R05 Cough: Secondary | ICD-10-CM | POA: Insufficient documentation

## 2017-04-14 MED ORDER — LIDOCAINE HCL (PF) 1 % IJ SOLN
5.0000 mL | Freq: Once | INTRAMUSCULAR | Status: AC
  Administered 2017-04-14: 5 mL via SUBCUTANEOUS
  Filled 2017-04-14: qty 5

## 2017-04-14 MED ORDER — MIDAZOLAM HCL 5 MG/5ML IJ SOLN
5.0000 mg | Freq: Once | INTRAMUSCULAR | Status: AC
  Administered 2017-04-14: 2 mg via INTRAVENOUS
  Filled 2017-04-14: qty 5

## 2017-04-14 MED ORDER — HYDROCODONE-ACETAMINOPHEN 5-325 MG PO TABS: 1.0000 | ORAL_TABLET | Freq: Every day | ORAL | 0 refills | Status: DC

## 2017-04-14 MED ORDER — ROPIVACAINE HCL 2 MG/ML IJ SOLN
10.0000 mL | Freq: Once | INTRAMUSCULAR | Status: AC
  Administered 2017-04-14: 10 mL via EPIDURAL
  Filled 2017-04-14: qty 10

## 2017-04-14 MED ORDER — DIAZEPAM 5 MG PO TABS: ORAL_TABLET | ORAL | 1 refills | Status: DC

## 2017-04-14 MED ORDER — TRAMADOL HCL 50 MG PO TABS: 50.0000 mg | ORAL_TABLET | Freq: Four times a day (QID) | ORAL | 3 refills | Status: DC | PRN

## 2017-04-14 MED ORDER — LACTATED RINGERS IV SOLN
1000.0000 mL | INTRAVENOUS | Status: DC
  Administered 2017-04-14: 1000 mL via INTRAVENOUS

## 2017-04-14 MED ORDER — IOPAMIDOL (ISOVUE-M 200) INJECTION 41%
20.0000 mL | Freq: Once | INTRAMUSCULAR | Status: DC | PRN
  Administered 2017-04-14: 10 mL
  Filled 2017-04-14: qty 20

## 2017-04-14 MED ORDER — TRIAMCINOLONE ACETONIDE 40 MG/ML IJ SUSP
40.0000 mg | Freq: Once | INTRAMUSCULAR | Status: AC
  Administered 2017-04-14: 40 mg
  Filled 2017-04-14: qty 1

## 2017-04-14 MED ORDER — SODIUM CHLORIDE 0.9 % IJ SOLN
INTRAMUSCULAR | Status: AC
  Filled 2017-04-14: qty 10

## 2017-04-14 MED ORDER — IOPAMIDOL (ISOVUE-M 200) INJECTION 41%
INTRAMUSCULAR | Status: AC
Start: 2017-04-14 — End: 2017-04-14
  Filled 2017-04-14: qty 10

## 2017-04-14 MED ORDER — SODIUM CHLORIDE 0.9% FLUSH
10.0000 mL | Freq: Once | INTRAVENOUS | Status: AC
  Administered 2017-04-14: 10 mL

## 2017-04-14 NOTE — Progress Notes (Signed)
Nursing Pain Medication Assessment:  Safety precautions to be maintained throughout the outpatient stay will include: orient to surroundings, keep bed in low position, maintain call bell within reach at all times, provide assistance with transfer out of bed and ambulation.  Medication Inspection Compliance: Pill count conducted under aseptic conditions, in front of the patient. Neither the pills nor the bottle was removed from the patient's sight at any time. Once count was completed pills were immediately returned to the patient in their original bottle.  Medication #1: Hydrocodone/APAP Pill/Patch Count: 14 of 30 pills remain Pill/Patch Appearance: Markings consistent with prescribed medication Bottle Appearance: Standard pharmacy container. Clearly labeled. Filled Date:01/16/ 2019 Last Medication intake:  Today  Medication #2: Tramadol (Ultram) Pill/Patch Count: 90 of 90 pills remain Pill/Patch Appearance: Markings consistent with prescribed medication Bottle Appearance: Standard pharmacy container. Clearly labeled. Filled Date: 01/29 / 2019 Last Medication intake:  Today

## 2017-04-14 NOTE — Progress Notes (Signed)
S 

## 2017-04-14 NOTE — Patient Instructions (Addendum)
____________________________________________________________________________________________  Post-Procedure instructions Instructions:  Apply ice: Fill a plastic sandwich bag with crushed ice. Cover it with a small towel and apply to injection site. Apply for 15 minutes then remove x 15 minutes. Repeat sequence on day of procedure, until you go to bed. The purpose is to minimize swelling and discomfort after procedure.  Apply heat: Apply heat to procedure site starting the day following the procedure. The purpose is to treat any soreness and discomfort from the procedure.  Food intake: Start with clear liquids (like water) and advance to regular food, as tolerated.   Physical activities: Keep activities to a minimum for the first 8 hours after the procedure.   Driving: If you have received any sedation, you are not allowed to drive for 24 hours after your procedure.  Blood thinner: Restart your blood thinner 6 hours after your procedure. (Only for those taking blood thinners)  Insulin: As soon as you can eat, you may resume your normal dosing schedule. (Only for those taking insulin)  Infection prevention: Keep procedure site clean and dry.  Post-procedure Pain Diary: Extremely important that this be done correctly and accurately. Recorded information will be used to determine the next step in treatment.  Pain evaluated is that of treated area only. Do not include pain from an untreated area.  Complete every hour, on the hour, for the initial 8 hours. Set an alarm to help you do this part accurately.  Do not go to sleep and have it completed later. It will not be accurate.  Follow-up appointment: Keep your follow-up appointment after the procedure. Usually 2 weeks for most procedures. (6 weeks in the case of radiofrequency.) Bring you pain diary.  Expect:  From numbing medicine (AKA: Local Anesthetics): Numbness or decrease in pain.  Onset: Full effect within 15 minutes of  injected.  Duration: It will depend on the type of local anesthetic used. On the average, 1 to 8 hours.   From steroids: Decrease in swelling or inflammation. Once inflammation is improved, relief of the pain will follow.  Onset of benefits: Depends on the amount of swelling present. The more swelling, the longer it will take for the benefits to be seen. In some cases, up to 10 days.  Duration: Steroids will stay in the system x 2 weeks. Duration of benefits will depend on multiple posibilities including persistent irritating factors.  From procedure: Some discomfort is to be expected once the numbing medicine wears off. This should be minimal if ice and heat are applied as instructed. Call if:  You experience numbness and weakness that gets worse with time, as opposed to wearing off.  New onset bowel or bladder incontinence. (Spinal procedures only)  Emergency Numbers:  Durning business hours (Monday - Thursday, 8:00 AM - 4:00 PM) (Friday, 9:00 AM - 12:00 Noon): [phone]  After hours: [phone] You were given one prescription each forTramadol and Diazepam, and 2 for Hydrocodone. ____________________________________________________________________________________________

## 2017-04-15 ENCOUNTER — Telehealth: Payer: Self-pay

## 2017-04-15 NOTE — Telephone Encounter (Signed)
Post procedure phone call.  Patient states she was doing good.   °

## 2017-04-17 NOTE — Progress Notes (Signed)
Subjective:  Patient ID: Carrie Myers, female    DOB: 28-Aug-1945  Age: 72 y.o. MRN: [id number]  CC: Back Pain (lower)   Procedure: L5-S1 epidural steroid under fluoroscopic guidance with moderate sedation  HPI Carrie Myers presents for reevaluation.  She was last seen a few weeks ago and was scheduled to have an epidural secondary to some left lower back pain with radiation in the left lower leg however she had an active upper respiratory infection with fevers chills and was on antibiotics just initiated.  At this point she still having some coughing but it is nonproductive with no fevers chills and is feeling better.  She desires to proceed with repeat injection today.  Otherwise she is in her usual state of health with no new changes in lower extremity strength or function or problems with bowel or bladder dysfunction.  She still taking her baseline medications including a Valium tablet every other day for muscle spasm and sleep in addition to as needed Vicodin generally taken twice a day to keep her pain under good control.  Based on her narcotic assessment sheet she continues to derive good functional lifestyle improvement with the medications.  He has been able to utilize tramadol to minimize her Vicodin usage effectively.  Outpatient Medications Prior to Visit  Medication Sig Dispense Refill  . aspirin EC 81 MG tablet Take 81 mg by mouth daily.    . chlorhexidine (PERIDEX) 0.12 % solution RINSE 1/2 OUNCE BID FOR 30 SECONDS  0  . citalopram (CELEXA) 20 MG tablet TAKE 1 TABLET BY MOUTH  DAILY 90 tablet 3  . cyclobenzaprine (FLEXERIL) 10 MG tablet Take 1 tablet (10 mg total) by mouth 3 (three) times daily. 270 tablet 0  . Diclofenac-Misoprostol 75-0.2 MG TBEC TAKE 1 TABLET BY MOUTH TWO  TIMES DAILY 180 tablet 1  . esomeprazole (NEXIUM) 20 MG capsule Take 40 mg by mouth daily. Over the Counter    . levothyroxine (SYNTHROID, LEVOTHROID) 88 MCG tablet TAKE 1 TABLET BY MOUTH  DAILY 90  tablet 3  . losartan (COZAAR) 50 MG tablet Take 50 mg by mouth daily.    . NON FORMULARY     . pravastatin (PRAVACHOL) 80 MG tablet Take 80 mg by mouth daily.    . pregabalin (LYRICA) 50 MG capsule Take 1 capsule (50 mg total) by mouth 3 (three) times daily. 84 capsule 0  . tolterodine (DETROL LA) 4 MG 24 hr capsule Take 1 capsule (4 mg total) by mouth daily. 30 capsule 11  . diazepam (VALIUM) 5 MG tablet Limit  1/2  tablet by mouth per day or every other day if tolerated 15 tablet 1  . HYDROcodone-acetaminophen (NORCO/VICODIN) 5-325 MG tablet Take 1 tablet by mouth 2 (two) times daily at 10 AM and 5 PM. Limit 1-2 tablets by mouth per day if tolerated 30 tablet 0  . traMADol (ULTRAM) 50 MG tablet Take 1 tablet (50 mg total) by mouth every 6 (six) hours as needed. 90 tablet 1  . Multiple Vitamin (MULTI-VITAMINS) TABS Take by mouth.     No facility-administered medications prior to visit.     Review of Systems CNS: No confusion or sedation Cardiac: No angina or palpitations GI: No abdominal pain or constipation Constitutional: No nausea vomiting fevers or chills  Objective:  BP 138/76   Pulse 88   Temp 98 F (36.7 C) (Oral)   Resp 16   Ht 5\' 8"  (1.727 m)   Wt 190 lb (  86.2 kg)   SpO2 98%   BMI 28.89 kg/m    BP Readings from Last 3 Encounters:  04/14/17 138/76  03/24/17 114/85  03/22/17 110/78     Wt Readings from Last 3 Encounters:  04/14/17 190 lb (86.2 kg)  03/24/17 190 lb (86.2 kg)  03/22/17 189 lb (85.7 kg)     Physical Exam Pt is alert and oriented PERRL EOMI HEART IS RRR no murmur or rub LCTA no wheezing or rales MUSCULOSKELETAL reveals some muscle spasms in the lower back with a positive straight leg raise left side negative on the right good muscle tone and bulk at baseline.  Labs  No results found for: HGBA1C Lab Results  Component Value Date   LDLCALC 47 03/18/2017   CREATININE 0.87 03/18/2017     -------------------------------------------------------------------------------------------------------------------- Lab Results  Component Value Date   WBC 6.0 03/18/2017   HGB 13.4 03/18/2017   HCT 39.4 03/18/2017   PLT 223 03/18/2017   GLUCOSE 97 03/18/2017   CHOL 122 03/18/2017   TRIG 163 (H) 03/18/2017   HDL 42 03/18/2017   LDLCALC 47 03/18/2017   ALT 58 (H) 03/18/2017   AST 53 (H) 03/18/2017   NA 145 (H) 03/18/2017   K 4.3 03/18/2017   CL 103 03/18/2017   CREATININE 0.87 03/18/2017   BUN 19 03/18/2017   CO2 24 03/18/2017   TSH 4.730 (H) 03/18/2017    --------------------------------------------------------------------------------------------------------------------- Dg C-arm 1-60 Min-no Report  Result Date: 04/14/2017 Fluoroscopy was utilized by the requesting physician.  No radiographic interpretation.     Assessment & Plan:   Carrie Myers was seen today for back pain.  Diagnoses and all orders for this visit:  Chronic bilateral low back pain with bilateral sciatica -     Lumbar Epidural Injection; Future  DDD (degenerative disc disease), lumbar -     Lumbar Epidural Injection; Future  Chronic pain syndrome  Facet syndrome, lumbar  Fibromyalgia  Generalized osteoarthritis  Other orders -     Discontinue: HYDROcodone-acetaminophen (NORCO/VICODIN) 5-325 MG tablet; Take 1 tablet by mouth at bedtime. Limit 1-2 tablets by mouth per day if tolerated -     HYDROcodone-acetaminophen (NORCO/VICODIN) 5-325 MG tablet; Take 1 tablet by mouth at bedtime. Limit 1-2 tablets by mouth per day if tolerated -     triamcinolone acetonide (KENALOG-40) injection 40 mg -     sodium chloride flush (NS) 0.9 % injection 10 mL -     ropivacaine (PF) 2 mg/mL (0.2%) (NAROPIN) injection 10 mL -     midazolam (VERSED) 5 MG/5ML injection 5 mg -     lidocaine (PF) (XYLOCAINE) 1 % injection 5 mL -     lactated ringers infusion 1,000 mL -     iopamidol (ISOVUE-M) 41 % intrathecal  injection 20 mL -     traMADol (ULTRAM) 50 MG tablet; Take 1 tablet (50 mg total) by mouth every 6 (six) hours as needed. -     diazepam (VALIUM) 5 MG tablet; Limit  1/2  tablet by mouth per day or every other day if tolerated        ----------------------------------------------------------------------------------------------------------------------  Problem List Items Addressed This Visit      Unprioritized   Chronic low back pain - Primary (Chronic)   Relevant Medications   HYDROcodone-acetaminophen (NORCO/VICODIN) 5-325 MG tablet   triamcinolone acetonide (KENALOG-40) injection 40 mg (Completed)   traMADol (ULTRAM) 50 MG tablet   Other Relevant Orders   Lumbar Epidural Injection   Chronic pain syndrome (Chronic)  DDD (degenerative disc disease), lumbar (Chronic)   Relevant Medications   HYDROcodone-acetaminophen (NORCO/VICODIN) 5-325 MG tablet   triamcinolone acetonide (KENALOG-40) injection 40 mg (Completed)   traMADol (ULTRAM) 50 MG tablet   Other Relevant Orders   Lumbar Epidural Injection   Facet syndrome, lumbar   Relevant Medications   HYDROcodone-acetaminophen (NORCO/VICODIN) 5-325 MG tablet   triamcinolone acetonide (KENALOG-40) injection 40 mg (Completed)   traMADol (ULTRAM) 50 MG tablet   Fibromyalgia   Generalized osteoarthritis   Relevant Medications   HYDROcodone-acetaminophen (NORCO/VICODIN) 5-325 MG tablet   triamcinolone acetonide (KENALOG-40) injection 40 mg (Completed)   traMADol (ULTRAM) 50 MG tablet        ----------------------------------------------------------------------------------------------------------------------  1. Chronic bilateral low back pain with bilateral sciatica We will proceed with an epidural steroid injection as requested by the patient and as scheduled today.  Hopefully this will improve the persistent sciatica that she has.  She has responded favorably to these in the past and they have kept her recalcitrant pain under  good control.  Unfortunately she has failed conservative therapy and gained no significant improvement with physical therapy modalities. - Lumbar Epidural Injection; Future  2. DDD (degenerative disc disease), lumbar Return to clinic in 2 months for reevaluation possible repeat injection at that time.  In the meantime she is to continue with her current medication regimen as once again reviewed today.  I gave him prescriptions were for February 12 and March 14.  We did review the [facility] practitioner database information and it was appropriate. - Lumbar Epidural Injection; Future  3. Chronic pain syndrome As above  4. Facet syndrome, lumbar Continue core stretching strengthening  5. Fibromyalgia As above  6. Generalized osteoarthritis     ----------------------------------------------------------------------------------------------------------------------  I am having Carrie Myers maintain her esomeprazole, losartan, pravastatin, citalopram, aspirin EC, chlorhexidine, Diclofenac-Misoprostol, tolterodine, MULTI-VITAMINS, NON FORMULARY, cyclobenzaprine, levothyroxine, pregabalin, HYDROcodone-acetaminophen, traMADol, and diazepam. We administered triamcinolone acetonide, sodium chloride flush, ropivacaine (PF) 2 mg/mL (0.2%), midazolam, lidocaine (PF), lactated ringers, and iopamidol.   Meds ordered this encounter  Medications  . DISCONTD: HYDROcodone-acetaminophen (NORCO/VICODIN) 5-325 MG tablet    Sig: Take 1 tablet by mouth at bedtime. Limit 1-2 tablets by mouth per day if tolerated    Dispense:  30 tablet    Refill:  0    Do Not Fill Until [id number]  . HYDROcodone-acetaminophen (NORCO/VICODIN) 5-325 MG tablet    Sig: Take 1 tablet by mouth at bedtime. Limit 1-2 tablets by mouth per day if tolerated    Dispense:  30 tablet    Refill:  0    Do Not Fill Until [id number]  . triamcinolone acetonide (KENALOG-40) injection 40 mg  . sodium chloride flush (NS) 0.9 %  injection 10 mL  . ropivacaine (PF) 2 mg/mL (0.2%) (NAROPIN) injection 10 mL  . midazolam (VERSED) 5 MG/5ML injection 5 mg  . lidocaine (PF) (XYLOCAINE) 1 % injection 5 mL  . lactated ringers infusion 1,000 mL  . iopamidol (ISOVUE-M) 41 % intrathecal injection 20 mL  . traMADol (ULTRAM) 50 MG tablet    Sig: Take 1 tablet (50 mg total) by mouth every 6 (six) hours as needed.    Dispense:  90 tablet    Refill:  3    Do not fill until [id number]  . diazepam (VALIUM) 5 MG tablet    Sig: Limit  1/2  tablet by mouth per day or every other day if tolerated    Dispense:  15 tablet    Refill:  1   Patient's Medications  New Prescriptions   No medications on file  Previous Medications   ASPIRIN EC 81 MG TABLET    Take 81 mg by mouth daily.   CHLORHEXIDINE (PERIDEX) 0.12 % SOLUTION    RINSE 1/2 OUNCE BID FOR 30 SECONDS   CITALOPRAM (CELEXA) 20 MG TABLET    TAKE 1 TABLET BY MOUTH  DAILY   CYCLOBENZAPRINE (FLEXERIL) 10 MG TABLET    Take 1 tablet (10 mg total) by mouth 3 (three) times daily.   DICLOFENAC-MISOPROSTOL 75-0.2 MG TBEC    TAKE 1 TABLET BY MOUTH TWO  TIMES DAILY   ESOMEPRAZOLE (NEXIUM) 20 MG CAPSULE    Take 40 mg by mouth daily. Over the Counter   LEVOTHYROXINE (SYNTHROID, LEVOTHROID) 88 MCG TABLET    TAKE 1 TABLET BY MOUTH  DAILY   LOSARTAN (COZAAR) 50 MG TABLET    Take 50 mg by mouth daily.   MULTIPLE VITAMIN (MULTI-VITAMINS) TABS    Take by mouth.   NON FORMULARY       PRAVASTATIN (PRAVACHOL) 80 MG TABLET    Take 80 mg by mouth daily.   PREGABALIN (LYRICA) 50 MG CAPSULE    Take 1 capsule (50 mg total) by mouth 3 (three) times daily.   TOLTERODINE (DETROL LA) 4 MG 24 HR CAPSULE    Take 1 capsule (4 mg total) by mouth daily.  Modified Medications   Modified Medication Previous Medication   DIAZEPAM (VALIUM) 5 MG TABLET diazepam (VALIUM) 5 MG tablet      Limit  1/2  tablet by mouth per day or every other day if tolerated    Limit  1/2  tablet by mouth per day or every other day if  tolerated   HYDROCODONE-ACETAMINOPHEN (NORCO/VICODIN) 5-325 MG TABLET HYDROcodone-acetaminophen (NORCO/VICODIN) 5-325 MG tablet      Take 1 tablet by mouth at bedtime. Limit 1-2 tablets by mouth per day if tolerated    Take 1 tablet by mouth 2 (two) times daily at 10 AM and 5 PM. Limit 1-2 tablets by mouth per day if tolerated   TRAMADOL (ULTRAM) 50 MG TABLET traMADol (ULTRAM) 50 MG tablet      Take 1 tablet (50 mg total) by mouth every 6 (six) hours as needed.    Take 1 tablet (50 mg total) by mouth every 6 (six) hours as needed.  Discontinued Medications   No medications on file   ----------------------------------------------------------------------------------------------------------------------  Follow-up: Return for evaluation, med refill.  Procedure: L5-S1 epidural steroid under fluoroscopic guidance with moderate sedation   Procedure: L5-S1 LESI with fluoroscopic guidance and moderate sedation  NOTE: The risks, benefits, and expectations of the procedure have been discussed and explained to the patient who was understanding and in agreement with suggested treatment plan. No guarantees were made.  DESCRIPTION OF PROCEDURE: Lumbar epidural steroid injection with  IV Versed, EKG, blood pressure, pulse, and pulse oximetry monitoring. The procedure was performed with the patient in the prone position under fluoroscopic guidance.  Sterile prep x3 was initiated and I then injected subcutaneous lidocaine to the overlying L5-S1 site after its fluoroscopic identifictation.  Using strict aseptic technique, I then advanced an 18-gauge Tuohy epidural needle in the midline using interlaminar approach via loss-of-resistance to saline technique. There was negative aspiration for heme or  CSF.  I then confirmed position with both AP and Lateral fluoroscan.  2 cc of Isovue were injected and a  total of 5 mL of Preservative-Free normal saline mixed with  40 mg of Kenalog and 1cc Ropicaine 0.2 percent were  injected incrementally via the  epidurally placed needle. The needle was removed. The patient tolerated the injection well and was convalesced and discharged to home in stable condition. Should the patient have any post procedure difficulty they have been instructed on how to contact us for assistance.    Molli Barrows, MD

## 2017-04-18 ENCOUNTER — Ambulatory Visit (INDEPENDENT_AMBULATORY_CARE_PROVIDER_SITE_OTHER): Payer: Medicare Other | Admitting: Internal Medicine

## 2017-04-18 ENCOUNTER — Encounter: Payer: Self-pay | Admitting: Internal Medicine

## 2017-04-18 VITALS — BP 134/82 | HR 99 | Temp 97.6°F | Ht 68.0 in | Wt 189.0 lb

## 2017-04-18 DIAGNOSIS — M47816 Spondylosis without myelopathy or radiculopathy, lumbar region: Secondary | ICD-10-CM

## 2017-04-18 DIAGNOSIS — R490 Dysphonia: Secondary | ICD-10-CM | POA: Diagnosis not present

## 2017-04-18 DIAGNOSIS — M797 Fibromyalgia: Secondary | ICD-10-CM

## 2017-04-18 NOTE — Progress Notes (Signed)
Date:  04/18/2017   Name:  Carrie Myers   DOB:  10-26-1945   MRN:  [id number]   Chief Complaint: Fibromyalgia (F/up on lyrica. Likes medication. Does help. ) and Sore Throat (Still not better. Left Ear still hurting. Lost voice, coughing, sore throat. No production. Taking mucinex and helps with mucous. ) Sore Throat   This is a recurrent problem. The problem has been waxing and waning. There has been no fever. Associated symptoms include ear pain and a hoarse voice. Pertinent negatives include no abdominal pain, coughing, headaches, shortness of breath or vomiting. Treatments tried: levaquin for bronchitis. The treatment provided mild relief.   Fibromyalgia - now taking lyrica 50 mg bid and feels much better.  Tender points are improved.  She also has ESI and her back is improved as well.  She would like another sample to increase to tid.    Review of Systems  Constitutional: Positive for fatigue. Negative for chills and fever.  HENT: Positive for ear pain, hoarse voice and voice change.   Respiratory: Negative for cough, chest tightness, shortness of breath and wheezing.   Cardiovascular: Negative for chest pain and palpitations.  Gastrointestinal: Negative for abdominal pain and vomiting.  Genitourinary: Negative for difficulty urinating.  Musculoskeletal: Positive for arthralgias, back pain and myalgias.  Neurological: Negative for dizziness and headaches.    Patient Active Problem List   Diagnosis Date Noted  . Hoarseness, persistent 04/18/2017  . OSA on CPAP 02/18/2017  . Cervical disc disease 12/31/2016  . Generalized osteoarthritis 12/23/2016  . Chronic pain syndrome 12/23/2016  . Chronic low back pain 12/23/2016  . Cystocele, midline 12/06/2016  . Urinary incontinence 12/06/2016  . Status post shoulder surgery 05/27/2015  . Impingement syndrome of right shoulder 04/17/2015  . Other shoulder lesions, right shoulder 04/17/2015  . Fibrocystic breast 01/18/2015  .  Gastroesophageal reflux disease without esophagitis 01/18/2015  . Hypothyroidism, postablative 01/18/2015  . Fracture of right foot 01/14/2015  . Arteriosclerosis of coronary artery 12/29/2014  . DJD of shoulder 11/13/2014  . Fibromyalgia 10/02/2014  . Intercostal neuralgia 10/02/2014  . DDD (degenerative disc disease), lumbar 10/02/2014  . Facet syndrome, lumbar 10/02/2014  . Sacroiliac joint disease 10/02/2014  . Carotid artery narrowing 07/09/2014  . Benign essential HTN 07/01/2014  . Osteoarthritis of thumb 09/24/2012  . Mixed hyperlipidemia 09/22/2012  . Arthritis of hand, degenerative 09/22/2012    Prior to Admission medications   Medication Sig Start Date End Date Taking? Authorizing Provider  aspirin EC 81 MG tablet Take 81 mg by mouth daily.    [provider]  chlorhexidine (PERIDEX) 0.12 % solution RINSE 1/2 OUNCE BID FOR 30 SECONDS 12/15/16   [provider]  citalopram (CELEXA) 20 MG tablet TAKE 1 TABLET BY MOUTH  DAILY 01/18/16   [name], MD  cyclobenzaprine (FLEXERIL) 10 MG tablet Take 1 tablet (10 mg total) by mouth 3 (three) times daily. 02/18/17   [name], MD  diazepam (VALIUM) 5 MG tablet Limit  1/2  tablet by mouth per day or every other day if tolerated 04/14/17   Molli Barrows, MD  Diclofenac-Misoprostol 75-0.2 MG TBEC TAKE 1 TABLET BY MOUTH TWO  TIMES DAILY 12/29/16   [name], MD  esomeprazole (NEXIUM) 20 MG capsule Take 40 mg by mouth daily. Over the Counter    [provider]  HYDROcodone-acetaminophen (NORCO/VICODIN) 5-325 MG tablet Take 1 tablet by mouth at bedtime. Limit 1-2 tablets by mouth per day  if tolerated 04/14/17 05/14/17  Molli Barrows, MD  levothyroxine (SYNTHROID, LEVOTHROID) 88 MCG tablet TAKE 1 TABLET BY MOUTH  DAILY 02/21/17   [name], MD  losartan (COZAAR) 50 MG tablet Take 50 mg by mouth daily.    [provider]  Multiple Vitamin (MULTI-VITAMINS) TABS Take by mouth.     [provider]  NON FORMULARY     [provider]  pravastatin (PRAVACHOL) 80 MG tablet Take 80 mg by mouth daily.    [provider]  pregabalin (LYRICA) 50 MG capsule Take 1 capsule (50 mg total) by mouth 3 (three) times daily. 03/18/17   [name], MD  tolterodine (DETROL LA) 4 MG 24 hr capsule Take 1 capsule (4 mg total) by mouth daily. 01/05/17   Gae Dry, MD  traMADol (ULTRAM) 50 MG tablet Take 1 tablet (50 mg total) by mouth every 6 (six) hours as needed. 04/14/17   Molli Barrows, MD    Allergies  Allergen Reactions  . Augmentin [Amoxicillin-Pot Clavulanate] Nausea And Vomiting  . Codeine Nausea And Vomiting  . Sulfa Antibiotics Nausea And Vomiting  . Shellfish Allergy Nausea And Vomiting, Nausea Only and Other (See Comments)    No issues with iodine     Past Surgical History:  Procedure Laterality Date  . ABDOMINAL HYSTERECTOMY     total  . APPENDECTOMY    . BREAST BIOPSY Right    neg  . BREAST CYST ASPIRATION Left    neg  . BUNIONECTOMY Bilateral   . CESAREAN SECTION    . COLONOSCOPY  2010   normal  . CORONARY ANGIOPLASTY WITH STENT PLACEMENT  04/2006  . dental implant     [age]  . epidural steroid injection  2018  . SHOULDER ARTHROSCOPY Right 05/07/2015   Procedure: Extensive arthroscopic debridement and arthroscopic subacromial decompression, right shoulder.  2. Steroid injection right thumb CMC joint.;  Surgeon: Corky Mull, MD;  Location: [facility];  Service: Orthopedics;  Laterality: Right;    Social History   Tobacco Use  . Smoking status: Former Smoker    Types: Cigarettes  . Smokeless tobacco: Former Systems developer    Quit date: 04/22/2006  . Tobacco comment: quit 8 years  Substance Use Topics  . Alcohol use: No    Alcohol/week: 0.0 oz  . Drug use: No     Medication list has been reviewed and updated.  PHQ 2/9 Scores 04/14/2017 03/24/2017 03/18/2017 02/25/2017  PHQ - 2 Score 0 0 0 0    PHQ- 9 Score - - - -  Exception Documentation - - - -    Physical Exam  Constitutional: She is oriented to person, place, and time. She appears well-developed. No distress.  HENT:  Head: Normocephalic and atraumatic.  Right Ear: Tympanic membrane and ear canal normal.  Left Ear: Ear canal normal. Tympanic membrane is scarred and retracted.  Nose: Right sinus exhibits no maxillary sinus tenderness. Left sinus exhibits no maxillary sinus tenderness.  Mouth/Throat: No posterior oropharyngeal edema or posterior oropharyngeal erythema.  Voice is very hoarse  Neck: Normal range of motion. Neck supple.  Cardiovascular: Normal rate, regular rhythm, S1 normal, normal heart sounds and normal pulses.  Pulmonary/Chest: Effort normal and breath sounds normal. No respiratory distress. She has no wheezes.  Musculoskeletal: Normal range of motion.  Neurological: She is alert and oriented to person, place, and time.  Skin: Skin is warm and dry. No rash noted.  Psychiatric: She has a  normal mood and affect. Her behavior is normal. Thought content normal.  Nursing note and vitals reviewed.   BP 134/82   Pulse 99   Temp 97.6 F (36.4 C) (Oral)   Ht 5\' 8"  (1.727 m)   Wt 189 lb (85.7 kg)   SpO2 98%   BMI 28.74 kg/m   Assessment and Plan: 1. Fibromyalgia Increase lyrica to 50 mg tid (samples)  2. Hoarseness, persistent See ENT for further evaluation  3. Facet syndrome, lumbar Recent ESI performed   No orders of the defined types were placed in this encounter.   Partially dictated using Editor, commissioning. Any errors are unintentional.  Halina Maidens, MD [facility]  04/18/2017

## 2017-04-20 DIAGNOSIS — J040 Acute laryngitis: Secondary | ICD-10-CM | POA: Diagnosis not present

## 2017-04-20 DIAGNOSIS — R490 Dysphonia: Secondary | ICD-10-CM | POA: Diagnosis not present

## 2017-04-21 DIAGNOSIS — H25813 Combined forms of age-related cataract, bilateral: Secondary | ICD-10-CM | POA: Diagnosis not present

## 2017-04-27 ENCOUNTER — Other Ambulatory Visit: Payer: Self-pay | Admitting: Internal Medicine

## 2017-04-27 DIAGNOSIS — M797 Fibromyalgia: Secondary | ICD-10-CM

## 2017-05-19 ENCOUNTER — Ambulatory Visit: Payer: Medicare Other

## 2017-05-23 DIAGNOSIS — H2511 Age-related nuclear cataract, right eye: Secondary | ICD-10-CM | POA: Diagnosis not present

## 2017-05-23 DIAGNOSIS — H25811 Combined forms of age-related cataract, right eye: Secondary | ICD-10-CM | POA: Diagnosis not present

## 2017-05-23 DIAGNOSIS — H52221 Regular astigmatism, right eye: Secondary | ICD-10-CM | POA: Diagnosis not present

## 2017-05-26 ENCOUNTER — Encounter: Payer: Self-pay | Admitting: Speech Pathology

## 2017-05-26 ENCOUNTER — Ambulatory Visit: Payer: Medicare Other | Attending: Otolaryngology | Admitting: Speech Pathology

## 2017-05-26 DIAGNOSIS — R490 Dysphonia: Secondary | ICD-10-CM | POA: Diagnosis not present

## 2017-05-26 NOTE — Therapy (Signed)
[place] MAIN [facility] SERVICES [address] [place], [place], [address] Phone: [phone]   Fax:  [phone]  Speech Language Pathology Evaluation  Patient Details  Name: Carrie Myers MRN: [id number] Date of Birth: [birth year] Referring Provider: Dr. Beverly Gust   Encounter Date: 05/26/2017  End of Session - 05/26/17 1600    Visit Number  1    Number of Visits  1 - Eval only   SLP Start Time  22    SLP Stop Time   1400    SLP Time Calculation (min)  45 min    Activity Tolerance  Patient tolerated treatment well       Past Medical History:  Diagnosis Date  . Allergy   . Anxiety   . Arthritis    neck, hands, lower back  . Bilateral dry eyes   . Dental crowns present    Implants  . Fibromyalgia   . GERD (gastroesophageal reflux disease)   . Hyperlipidemia   . Hypertension   . Kidney stones   . Myocardial infarction ([place]) 04/22/2006  . Osteoarthritis   . Rheumatoid arthritis ([place])   . Thyroid disease     Past Surgical History:  Procedure Laterality Date  . ABDOMINAL HYSTERECTOMY     total  . APPENDECTOMY    . BREAST BIOPSY Right    neg  . BREAST CYST ASPIRATION Left    neg  . BUNIONECTOMY Bilateral   . CESAREAN SECTION    . COLONOSCOPY  2010   normal  . CORONARY ANGIOPLASTY WITH STENT PLACEMENT  04/2006  . dental implant     [age]  . epidural steroid injection  2018  . SHOULDER ARTHROSCOPY Right 05/07/2015   Procedure: Extensive arthroscopic debridement and arthroscopic subacromial decompression, right shoulder.  2. Steroid injection right thumb CMC joint.;  Surgeon: Corky Mull, MD;  Location: [facility];  Service: Orthopedics;  Laterality: Right;    There were no vitals filed for this visit.  Subjective Assessment - 05/26/17 1556    Subjective  Pt pleasant and cooperative.    Currently in Pain?  No/denies         SLP Evaluation [facility] - 05/26/17 1556      SLP Visit  Information   SLP Received On  05/26/17    Referring Provider  Dr. Beverly Gust    Onset Date  04/20/2017    Medical Diagnosis  Ventricular Phonation      Subjective   Subjective  Pt pleasant and cooperative    Patient/Family Stated Goal  none stated      General Information   HPI  72 year old female referred for outpatient speech therapy evaluation due to ventricular phonation, dysphonia. PMH significant for HTN, HLD, MI, Arthritis, Fibromyalgia, Depression.    Behavioral/Cognition  WFL    Mobility Status  WFL      Prior Functional Status   Cognitive/Linguistic Baseline  Within functional limits      Cognition   Overall Cognitive Status  Within Functional Limits for tasks assessed      Auditory Comprehension   Overall Auditory Comprehension  Appears within functional limits for tasks assessed      Reading Comprehension   Reading Status  Within functional limits      Expression   Primary Mode of Expression  Verbal      Verbal Expression   Overall Verbal Expression  Appears within functional limits for tasks assessed  Oral Motor/Sensory Function   Overall Oral Motor/Sensory Function  Appears within functional limits for tasks assessed      Motor Speech   Overall Motor Speech  Appears within functional limits for tasks assessed         SLP Education - 05/26/17 1559    Education provided  Yes    Education Details  Written information provided and reviewed regarding vocal relaxation and vocal care do's and don'ts    Person(s) Educated  Patient    Methods  Explanation;Demonstration;Verbal cues;Handout    Comprehension  Verbalized understanding           Plan - 05/26/17 1600    Clinical Impression Statement ENT notes indicated ventricular phonation, which is typically due to excess compression (increased tension) of the false vocal folds.  Pt presents today with adequate vocalization and normal oral motor strength and function. Pt scored 28 on the Voice  Handicap Index (mild). She indicates her hoarseness has improved since recovering from bronchitis.   Pt was provided with educational information regarding vocal relaxation tasks, dietary and behavioral management of esophageal issues, and vocal care do's and don'ts.   No further ST intervention is recommended at this time, however, pt was encouraged to notify PCP if vocal issues arise in the future.   Thank you for this referral.    Speech Therapy Frequency  One time visit    Potential to Achieve Goals  Good    Potential Considerations  Ability to learn/carryover information;Previous level of function    SLP Home Exercise Plan  provided and reviewed    Consulted and Agree with Plan of Care  Patient       Patient will benefit from skilled therapeutic intervention in order to improve the following deficits and impairments:   Dysphonia    Problem List Patient Active Problem List   Diagnosis Date Noted  . Hoarseness, persistent 04/18/2017  . OSA on CPAP 02/18/2017  . Cervical disc disease 12/31/2016  . Generalized osteoarthritis 12/23/2016  . Chronic pain syndrome 12/23/2016  . Chronic low back pain 12/23/2016  . Cystocele, midline 12/06/2016  . Urinary incontinence 12/06/2016  . Status post shoulder surgery 05/27/2015  . Impingement syndrome of right shoulder 04/17/2015  . Other shoulder lesions, right shoulder 04/17/2015  . Fibrocystic breast 01/18/2015  . Gastroesophageal reflux disease without esophagitis 01/18/2015  . Hypothyroidism, postablative 01/18/2015  . Fracture of right foot 01/14/2015  . Arteriosclerosis of coronary artery 12/29/2014  . DJD of shoulder 11/13/2014  . Fibromyalgia 10/02/2014  . Intercostal neuralgia 10/02/2014  . DDD (degenerative disc disease), lumbar 10/02/2014  . Facet syndrome, lumbar 10/02/2014  . Sacroiliac joint disease 10/02/2014  . Carotid artery narrowing 07/09/2014  . Benign essential HTN 07/01/2014  . Osteoarthritis of thumb  09/24/2012  . Mixed hyperlipidemia 09/22/2012  . Arthritis of hand, degenerative 09/22/2012   Sricharan Lacomb [employer], CCC-SLP Speech Language Pathologist  Shonna Chock 05/26/2017, 4:05 PM  [place] MAIN [facility] SERVICES [address] [place], [place], [address] Phone: [phone]   Fax:  [phone]  Name: Carrie Myers MRN: [id number] Date of Birth: [birth year]

## 2017-06-06 DIAGNOSIS — H2512 Age-related nuclear cataract, left eye: Secondary | ICD-10-CM | POA: Diagnosis not present

## 2017-06-06 DIAGNOSIS — H52222 Regular astigmatism, left eye: Secondary | ICD-10-CM | POA: Diagnosis not present

## 2017-06-06 DIAGNOSIS — H25812 Combined forms of age-related cataract, left eye: Secondary | ICD-10-CM | POA: Diagnosis not present

## 2017-06-08 ENCOUNTER — Encounter: Payer: Self-pay | Admitting: Anesthesiology

## 2017-06-08 ENCOUNTER — Ambulatory Visit: Payer: Medicare Other | Attending: Anesthesiology | Admitting: Anesthesiology

## 2017-06-08 ENCOUNTER — Other Ambulatory Visit: Payer: Self-pay

## 2017-06-08 VITALS — BP 129/89 | HR 110 | Temp 98.3°F | Ht 68.0 in | Wt 189.0 lb

## 2017-06-08 DIAGNOSIS — M159 Polyosteoarthritis, unspecified: Secondary | ICD-10-CM | POA: Diagnosis not present

## 2017-06-08 DIAGNOSIS — M47816 Spondylosis without myelopathy or radiculopathy, lumbar region: Secondary | ICD-10-CM | POA: Insufficient documentation

## 2017-06-08 DIAGNOSIS — M79605 Pain in left leg: Secondary | ICD-10-CM | POA: Insufficient documentation

## 2017-06-08 DIAGNOSIS — M5441 Lumbago with sciatica, right side: Secondary | ICD-10-CM | POA: Insufficient documentation

## 2017-06-08 DIAGNOSIS — Z7982 Long term (current) use of aspirin: Secondary | ICD-10-CM | POA: Diagnosis not present

## 2017-06-08 DIAGNOSIS — G894 Chronic pain syndrome: Secondary | ICD-10-CM | POA: Diagnosis not present

## 2017-06-08 DIAGNOSIS — Z79899 Other long term (current) drug therapy: Secondary | ICD-10-CM | POA: Insufficient documentation

## 2017-06-08 DIAGNOSIS — M19011 Primary osteoarthritis, right shoulder: Secondary | ICD-10-CM | POA: Diagnosis not present

## 2017-06-08 DIAGNOSIS — M62838 Other muscle spasm: Secondary | ICD-10-CM | POA: Diagnosis not present

## 2017-06-08 DIAGNOSIS — M545 Low back pain: Secondary | ICD-10-CM | POA: Diagnosis present

## 2017-06-08 DIAGNOSIS — M25512 Pain in left shoulder: Secondary | ICD-10-CM | POA: Diagnosis not present

## 2017-06-08 DIAGNOSIS — J209 Acute bronchitis, unspecified: Secondary | ICD-10-CM

## 2017-06-08 DIAGNOSIS — M5442 Lumbago with sciatica, left side: Secondary | ICD-10-CM

## 2017-06-08 DIAGNOSIS — M797 Fibromyalgia: Secondary | ICD-10-CM

## 2017-06-08 DIAGNOSIS — M19012 Primary osteoarthritis, left shoulder: Secondary | ICD-10-CM

## 2017-06-08 DIAGNOSIS — G8929 Other chronic pain: Secondary | ICD-10-CM | POA: Diagnosis not present

## 2017-06-08 DIAGNOSIS — M5136 Other intervertebral disc degeneration, lumbar region: Secondary | ICD-10-CM

## 2017-06-08 DIAGNOSIS — M79604 Pain in right leg: Secondary | ICD-10-CM | POA: Insufficient documentation

## 2017-06-08 MED ORDER — DIAZEPAM 5 MG PO TABS: ORAL_TABLET | ORAL | 1 refills | Status: DC

## 2017-06-08 MED ORDER — HYDROCODONE-ACETAMINOPHEN 5-325 MG PO TABS: 1.0000 | ORAL_TABLET | Freq: Every day | ORAL | 0 refills | Status: DC

## 2017-06-08 NOTE — Progress Notes (Signed)
Nursing Pain Medication Assessment:  Safety precautions to be maintained throughout the outpatient stay will include: orient to surroundings, keep bed in low position, maintain call bell within reach at all times, provide assistance with transfer out of bed and ambulation.  Medication Inspection Compliance: Pill count conducted under aseptic conditions, in front of the patient. Neither the pills nor the bottle was removed from the patient's sight at any time. Once count was completed pills were immediately returned to the patient in their original bottle.  Medication #1: Hydrocodone/APAP Pill/Patch Count: 6 of 30 pills remain Pill/Patch Appearance: Markings consistent with prescribed medication Bottle Appearance: Standard pharmacy container. Clearly labeled. Filled Date: 02 / 28 / 2019 Last Medication intake:  Today  Medication #2: Tramadol (Ultram) Pill/Patch Count: 10 of 90 pills remain Pill/Patch Appearance: Markings consistent with prescribed medication Bottle Appearance: Standard pharmacy container. Clearly labeled. Filled Date: 02 / 28 / 2019 Last Medication intake:  Today

## 2017-06-10 NOTE — Progress Notes (Signed)
Subjective:  Patient ID: Carrie Myers, female    DOB: 25-Mar-1945  Age: 72 y.o. MRN: [id number]  CC: Back Pain (low)   Procedure: None  HPI Carrie Myers presents for evaluation.  She was last seen a few months ago and is doing well with her current regimen.  She uses her Valium approximately every other day for muscle spasm and this does seem to help more effectively and taking Flexeril alone..  Furthermore she is taking 1 Vicodin tablet generally in the evening but spaced out from the Valium which is giving her good relief.  I have reviewed her narcotic assessment sheet she continues to derive good functional lifestyle improvement with her medications.  Otherwise she is in her usual state of health with persistent low back pain of the same quality characteristic and distribution as before with no change in lower extremity strength or function.  She seems to be responding favorably to her medication management.  She is trying to work on her stretching strengthening exercises as tolerated and continue to work on core activity.  The quality of the low back pain is still an aching gnawing pain with radiation into the legs.  At her last visit she had an epidural steroid injection that gave her approximately 75% improvement and she still doing well following this.  He is noting less lower back pain and less leg pain at this point.  Outpatient Medications Prior to Visit  Medication Sig Dispense Refill  . aspirin EC 81 MG tablet Take 81 mg by mouth daily.    . chlorhexidine (PERIDEX) 0.12 % solution RINSE 1/2 OUNCE BID FOR 30 SECONDS  0  . citalopram (CELEXA) 20 MG tablet TAKE 1 TABLET BY MOUTH  DAILY 90 tablet 3  . cyclobenzaprine (FLEXERIL) 10 MG tablet Take 1 tablet (10 mg total) by mouth 3 (three) times daily. 270 tablet 0  . Diclofenac-Misoprostol 75-0.2 MG TBEC TAKE 1 TABLET BY MOUTH TWO  TIMES DAILY 180 tablet 1  . esomeprazole (NEXIUM) 20 MG capsule Take 40 mg by mouth daily. Over the  Counter    . levothyroxine (SYNTHROID, LEVOTHROID) 88 MCG tablet TAKE 1 TABLET BY MOUTH  DAILY 90 tablet 3  . losartan (COZAAR) 50 MG tablet Take 50 mg by mouth daily.    . Multiple Vitamin (MULTI-VITAMINS) TABS Take by mouth.    . NON FORMULARY     . pravastatin (PRAVACHOL) 80 MG tablet Take 80 mg by mouth daily.    Marland Kitchen tolterodine (DETROL LA) 4 MG 24 hr capsule Take 1 capsule (4 mg total) by mouth daily. 30 capsule 11  . traMADol (ULTRAM) 50 MG tablet Take 1 tablet (50 mg total) by mouth every 6 (six) hours as needed. 90 tablet 3  . pregabalin (LYRICA) 50 MG capsule Take 1 capsule (50 mg total) by mouth 3 (three) times daily. (Patient not taking: Reported on 06/08/2017) 84 capsule 0  . diazepam (VALIUM) 5 MG tablet Limit  1/2  tablet by mouth per day or every other day if tolerated 15 tablet 1  . HYDROcodone-acetaminophen (NORCO/VICODIN) 5-325 MG tablet Take 1 tablet by mouth at bedtime. Limit 1-2 tablets by mouth per day if tolerated 30 tablet 0   No facility-administered medications prior to visit.     Review of Systems CNS: No confusion or sedation Cardiac: No angina or palpitations GI: No abdominal pain or constipation Constitutional: No nausea vomiting fevers or chills  Objective:  BP 129/89 (BP Location: Left Arm, Patient  Position: Sitting, Cuff Size: Normal)   Pulse (!) 110   Temp 98.3 F (36.8 C) (Oral)   Ht 5\' 8"  (1.727 m)   Wt 189 lb (85.7 kg)   SpO2 95%   BMI 28.74 kg/m    BP Readings from Last 3 Encounters:  06/08/17 129/89  04/18/17 134/82  04/14/17 138/76     Wt Readings from Last 3 Encounters:  06/08/17 189 lb (85.7 kg)  04/18/17 189 lb (85.7 kg)  04/14/17 190 lb (86.2 kg)     Physical Exam Pt is alert and oriented PERRL EOMI HEART IS RRR no murmur or rub LCTA no wheezing or rales MUSCULOSKELETAL reveals some paraspinous muscle tenderness.  Her lower extremity muscle tone and bulk is at baseline.    Labs  No results found for: HGBA1C Lab Results   Component Value Date   LDLCALC 47 03/18/2017   CREATININE 0.87 03/18/2017    -------------------------------------------------------------------------------------------------------------------- Lab Results  Component Value Date   WBC 6.0 03/18/2017   HGB 13.4 03/18/2017   HCT 39.4 03/18/2017   PLT 223 03/18/2017   GLUCOSE 97 03/18/2017   CHOL 122 03/18/2017   TRIG 163 (H) 03/18/2017   HDL 42 03/18/2017   LDLCALC 47 03/18/2017   ALT 58 (H) 03/18/2017   AST 53 (H) 03/18/2017   NA 145 (H) 03/18/2017   K 4.3 03/18/2017   CL 103 03/18/2017   CREATININE 0.87 03/18/2017   BUN 19 03/18/2017   CO2 24 03/18/2017   TSH 4.730 (H) 03/18/2017    --------------------------------------------------------------------------------------------------------------------- Dg C-arm 1-60 Min-no Report  Result Date: 04/14/2017 Fluoroscopy was utilized by the requesting physician.  No radiographic interpretation.     Assessment & Plan:   Carrie Myers was seen today for back pain.  Diagnoses and all orders for this visit:  Chronic bilateral low back pain with bilateral sciatica  DDD (degenerative disc disease), lumbar  Chronic pain syndrome  Facet syndrome, lumbar  Fibromyalgia  Generalized osteoarthritis  Acute bronchitis, unspecified organism  Primary osteoarthritis of both shoulders  Pain in joint of left shoulder  Other orders -     HYDROcodone-acetaminophen (NORCO/VICODIN) 5-325 MG tablet; Take 1 tablet by mouth at bedtime. Limit 1-2 tablets by mouth per day if tolerated -     diazepam (VALIUM) 5 MG tablet; Limit  1/2  tablet by mouth per day or every other day if tolerated        ----------------------------------------------------------------------------------------------------------------------  Problem List Items Addressed This Visit      Unprioritized   Chronic low back pain - Primary (Chronic)   Relevant Medications   HYDROcodone-acetaminophen (NORCO/VICODIN) 5-325  MG tablet   Chronic pain syndrome (Chronic)   DDD (degenerative disc disease), lumbar (Chronic)   Relevant Medications   HYDROcodone-acetaminophen (NORCO/VICODIN) 5-325 MG tablet   DJD of shoulder   Relevant Medications   HYDROcodone-acetaminophen (NORCO/VICODIN) 5-325 MG tablet   Facet syndrome, lumbar   Relevant Medications   HYDROcodone-acetaminophen (NORCO/VICODIN) 5-325 MG tablet   Fibromyalgia   Generalized osteoarthritis   Relevant Medications   HYDROcodone-acetaminophen (NORCO/VICODIN) 5-325 MG tablet    Other Visit Diagnoses    Acute bronchitis, unspecified organism       Pain in joint of left shoulder            ----------------------------------------------------------------------------------------------------------------------  1. Chronic bilateral low back pain with bilateral sciatica Continue core strengthening as reviewed again today with physical therapy modality.  Also have her continue with her current medication management as well.  Refills were given  for her Vicodin for March 27 and April 26 for 30 tablets.  She is to continue to use these sparingly.  We have reviewed the [facility] practitioner database information and it is appropriate.  Refill was also given for Valium.  We will have her return to clinic in 2 months for reevaluation.  We may consider repeat epidural injection if her radicular pain intensifies.  2. DDD (degenerative disc disease), lumbar   3. Chronic pain syndrome   4. Facet syndrome, lumbar   5. Fibromyalgia   6. Generalized osteoarthritis   7. Acute bronchitis, unspecified organism   8. Primary osteoarthritis of both shoulders   9. Pain in joint of left shoulder     ----------------------------------------------------------------------------------------------------------------------  I am having Carrie Myers maintain her esomeprazole, losartan, pravastatin, aspirin EC, chlorhexidine, Diclofenac-miSOPROStol,  tolterodine, MULTI-VITAMINS, NON FORMULARY, cyclobenzaprine, levothyroxine, pregabalin, traMADol, citalopram, HYDROcodone-acetaminophen, and diazepam.   Meds ordered this encounter  Medications  . HYDROcodone-acetaminophen (NORCO/VICODIN) 5-325 MG tablet    Sig: Take 1 tablet by mouth at bedtime. Limit 1-2 tablets by mouth per day if tolerated    Dispense:  30 tablet    Refill:  0    Do Not Fill Until [id number]  . diazepam (VALIUM) 5 MG tablet    Sig: Limit  1/2  tablet by mouth per day or every other day if tolerated    Dispense:  15 tablet    Refill:  1   Patient's Medications  New Prescriptions   No medications on file  Previous Medications   ASPIRIN EC 81 MG TABLET    Take 81 mg by mouth daily.   CHLORHEXIDINE (PERIDEX) 0.12 % SOLUTION    RINSE 1/2 OUNCE BID FOR 30 SECONDS   CITALOPRAM (CELEXA) 20 MG TABLET    TAKE 1 TABLET BY MOUTH  DAILY   CYCLOBENZAPRINE (FLEXERIL) 10 MG TABLET    Take 1 tablet (10 mg total) by mouth 3 (three) times daily.   DICLOFENAC-MISOPROSTOL 75-0.2 MG TBEC    TAKE 1 TABLET BY MOUTH TWO  TIMES DAILY   ESOMEPRAZOLE (NEXIUM) 20 MG CAPSULE    Take 40 mg by mouth daily. Over the Counter   LEVOTHYROXINE (SYNTHROID, LEVOTHROID) 88 MCG TABLET    TAKE 1 TABLET BY MOUTH  DAILY   LOSARTAN (COZAAR) 50 MG TABLET    Take 50 mg by mouth daily.   MULTIPLE VITAMIN (MULTI-VITAMINS) TABS    Take by mouth.   NON FORMULARY       PRAVASTATIN (PRAVACHOL) 80 MG TABLET    Take 80 mg by mouth daily.   PREGABALIN (LYRICA) 50 MG CAPSULE    Take 1 capsule (50 mg total) by mouth 3 (three) times daily.   TOLTERODINE (DETROL LA) 4 MG 24 HR CAPSULE    Take 1 capsule (4 mg total) by mouth daily.   TRAMADOL (ULTRAM) 50 MG TABLET    Take 1 tablet (50 mg total) by mouth every 6 (six) hours as needed.  Modified Medications   Modified Medication Previous Medication   DIAZEPAM (VALIUM) 5 MG TABLET diazepam (VALIUM) 5 MG tablet      Limit  1/2  tablet by mouth per day or every other day if  tolerated    Limit  1/2  tablet by mouth per day or every other day if tolerated   HYDROCODONE-ACETAMINOPHEN (NORCO/VICODIN) 5-325 MG TABLET HYDROcodone-acetaminophen (NORCO/VICODIN) 5-325 MG tablet      Take 1 tablet by mouth at bedtime. Limit 1-2 tablets by mouth  per day if tolerated    Take 1 tablet by mouth at bedtime. Limit 1-2 tablets by mouth per day if tolerated  Discontinued Medications   No medications on file   ----------------------------------------------------------------------------------------------------------------------  Follow-up: Return in about 2 months (around 08/08/2017) for evaluation, med refill.    Molli Barrows, MD

## 2017-08-03 ENCOUNTER — Ambulatory Visit: Payer: Medicare Other | Attending: Anesthesiology | Admitting: Anesthesiology

## 2017-08-03 ENCOUNTER — Encounter: Payer: Self-pay | Admitting: Anesthesiology

## 2017-08-03 VITALS — BP 133/94 | HR 89 | Resp 16 | Ht 68.0 in | Wt 190.0 lb

## 2017-08-03 DIAGNOSIS — G8929 Other chronic pain: Secondary | ICD-10-CM | POA: Diagnosis not present

## 2017-08-03 DIAGNOSIS — G894 Chronic pain syndrome: Secondary | ICD-10-CM | POA: Insufficient documentation

## 2017-08-03 DIAGNOSIS — M5136 Other intervertebral disc degeneration, lumbar region: Secondary | ICD-10-CM | POA: Diagnosis not present

## 2017-08-03 DIAGNOSIS — M533 Sacrococcygeal disorders, not elsewhere classified: Secondary | ICD-10-CM | POA: Diagnosis not present

## 2017-08-03 DIAGNOSIS — M19012 Primary osteoarthritis, left shoulder: Secondary | ICD-10-CM | POA: Diagnosis not present

## 2017-08-03 DIAGNOSIS — Z79899 Other long term (current) drug therapy: Secondary | ICD-10-CM | POA: Insufficient documentation

## 2017-08-03 DIAGNOSIS — M19011 Primary osteoarthritis, right shoulder: Secondary | ICD-10-CM | POA: Diagnosis not present

## 2017-08-03 DIAGNOSIS — M5442 Lumbago with sciatica, left side: Secondary | ICD-10-CM | POA: Insufficient documentation

## 2017-08-03 DIAGNOSIS — Z79890 Hormone replacement therapy (postmenopausal): Secondary | ICD-10-CM | POA: Diagnosis not present

## 2017-08-03 DIAGNOSIS — M47816 Spondylosis without myelopathy or radiculopathy, lumbar region: Secondary | ICD-10-CM | POA: Diagnosis not present

## 2017-08-03 DIAGNOSIS — Z7982 Long term (current) use of aspirin: Secondary | ICD-10-CM | POA: Insufficient documentation

## 2017-08-03 DIAGNOSIS — M797 Fibromyalgia: Secondary | ICD-10-CM | POA: Insufficient documentation

## 2017-08-03 DIAGNOSIS — M545 Low back pain: Secondary | ICD-10-CM | POA: Diagnosis not present

## 2017-08-03 MED ORDER — HYDROCODONE-ACETAMINOPHEN 5-325 MG PO TABS: 1.0000 | ORAL_TABLET | Freq: Every day | ORAL | 0 refills | Status: DC

## 2017-08-03 MED ORDER — DIAZEPAM 5 MG PO TABS: ORAL_TABLET | ORAL | 1 refills | Status: DC

## 2017-08-03 MED ORDER — TRAMADOL HCL 50 MG PO TABS: 50.0000 mg | ORAL_TABLET | Freq: Four times a day (QID) | ORAL | 3 refills | Status: DC | PRN

## 2017-08-03 NOTE — Patient Instructions (Signed)
Trigger Point Injection  Trigger points are areas where you have pain. A trigger point injection is a shot given in the trigger point to help relieve pain for a few days to a few months. Common places for trigger points include:  · The neck.  · The shoulders.  · The upper back.  · The lower back.    A trigger point injection will not cure long-lasting (chronic) pain permanently. These injections do not always work for every person, but for some people they can help to relieve pain for a few days to a few months.  Tell a health care provider about:  · Any allergies you have.  · All medicines you are taking, including vitamins, herbs, eye drops, creams, and over-the-counter medicines.  · Any problems you or family members have had with anesthetic medicines.  · Any blood disorders you have.  · Any surgeries you have had.  · Any medical conditions you have.  What are the risks?  Generally, this is a safe procedure. However, problems may occur, including:  · Infection.  · Bleeding.  · Allergic reaction to the injected medicine.  · Irritation of the skin around the injection site.    What happens before the procedure?  · Ask your health care provider about changing or stopping your regular medicines. This is especially important if you are taking diabetes medicines or blood thinners.  What happens during the procedure?  · Your health care provider will feel for trigger points. A marker may be used to circle the area for the injection.  · The skin over the trigger point will be washed with a germ-killing (antiseptic) solution.  · A thin needle is used for the shot. You may feel pain or a twitching feeling when the needle enters the trigger point.  · A numbing solution may be injected into the trigger point. Sometimes a medicine to keep down swelling, redness, and warmth (inflammation) is also injected.  · Your health care provider may move the needle around the area where the trigger point is located until the tightness  and twitching goes away.  · After the injection, your health care provider may put gentle pressure over the injection site.  · The injection site will be covered with a bandage (dressing).  The procedure may vary among health care providers and hospitals.  What happens after the procedure?  · The dressing can be taken off in a few hours or as told by your health care provider.  · You may feel sore and stiff for 1-2 days.  This information is not intended to replace advice given to you by your health care provider. Make sure you discuss any questions you have with your health care provider.  Document Released: 02/18/2011 Document Revised: 11/02/2015 Document Reviewed: 08/19/2014  Elsevier Interactive Patient Education © 2018 [employer]

## 2017-08-03 NOTE — Progress Notes (Signed)
Nursing Pain Medication Assessment:  ?Safety precautions to be maintained throughout the outpatient stay will include: orient to surroundings, keep bed in low position, maintain call bell within reach at all times, provide assistance with transfer out of bed and ambulation.  ?Medication Inspection Compliance: Carrie Myers did not comply with our request to bring her pills to be counted. She was reminded that bringing the medication bottles, even when empty, is a requirement. ? ?Medication: None brought in. ?Pill/Patch Count: None available to be counted. ?Bottle Appearance: No container available. Did not bring bottle(s) to appointment. ?Filled Date: N/A ?Last Medication intake:  Today ?

## 2017-08-05 NOTE — Progress Notes (Signed)
Subjective:  Patient ID: Carrie Myers, female    DOB: 13-Jan-1946  Age: 72 y.o. MRN: [id number]  CC: Back Pain (lower bilateral) and Generalized Body Aches (fibromyalgia)   Procedure: None  HPI Carrie Myers presents for reevaluation.  She was last seen 2 months ago and is been doing well with her regimen.  She still using her Valium half tablet every day or tablet every other day for the muscle spasms in her low back.  She is also using occasional hydrocodone for her back pain but generally averaging no more than 1 tablet/day.  She also uses tramadol proximally 1 tablet 3 times a day.  This combination seems to keep her low back pain and good check.  No change in the quality characteristic of distribution of her pain is noted at this time.  Based on her narcotic assessment sheet she continues to derive good functional lifestyle improvement with the opioid medications.  Outpatient Medications Prior to Visit  Medication Sig Dispense Refill  . aspirin EC 81 MG tablet Take 81 mg by mouth daily.    . citalopram (CELEXA) 20 MG tablet TAKE 1 TABLET BY MOUTH  DAILY 90 tablet 3  . cyclobenzaprine (FLEXERIL) 10 MG tablet Take 1 tablet (10 mg total) by mouth 3 (three) times daily. 270 tablet 0  . Diclofenac-Misoprostol 75-0.2 MG TBEC TAKE 1 TABLET BY MOUTH TWO  TIMES DAILY 180 tablet 1  . esomeprazole (NEXIUM) 20 MG capsule Take 40 mg by mouth daily. Over the Counter    . levothyroxine (SYNTHROID, LEVOTHROID) 88 MCG tablet TAKE 1 TABLET BY MOUTH  DAILY 90 tablet 3  . losartan (COZAAR) 50 MG tablet Take 50 mg by mouth daily.    . Multiple Vitamin (MULTI-VITAMINS) TABS Take by mouth.    . NON FORMULARY     . pravastatin (PRAVACHOL) 80 MG tablet Take 80 mg by mouth daily.    Marland Kitchen tolterodine (DETROL LA) 4 MG 24 hr capsule Take 1 capsule (4 mg total) by mouth daily. 30 capsule 11  . pregabalin (LYRICA) 50 MG capsule Take 1 capsule (50 mg total) by mouth 3 (three) times daily. (Patient not taking:  Reported on 06/08/2017) 84 capsule 0  . chlorhexidine (PERIDEX) 0.12 % solution RINSE 1/2 OUNCE BID FOR 30 SECONDS  0  . diazepam (VALIUM) 5 MG tablet Limit  1/2  tablet by mouth per day or every other day if tolerated 15 tablet 1  . HYDROcodone-acetaminophen (NORCO/VICODIN) 5-325 MG tablet Take 1 tablet by mouth at bedtime. Limit 1-2 tablets by mouth per day if tolerated 30 tablet 0  . traMADol (ULTRAM) 50 MG tablet Take 1 tablet (50 mg total) by mouth every 6 (six) hours as needed. 90 tablet 3   No facility-administered medications prior to visit.     Review of Systems CNS: No confusion or sedation Cardiac: No angina or palpitations GI: No abdominal pain or constipation Constitutional: No nausea vomiting fevers or chills  Objective:  BP (!) 133/94 (BP Location: Left Arm, Patient Position: Sitting, Cuff Size: Normal)   Pulse 89   Resp 16   Ht 5\' 8"  (1.727 m)   Wt 190 lb (86.2 kg)   SpO2 92%   BMI 28.89 kg/m    BP Readings from Last 3 Encounters:  08/03/17 (!) 133/94  06/08/17 129/89  04/18/17 134/82     Wt Readings from Last 3 Encounters:  08/03/17 190 lb (86.2 kg)  06/08/17 189 lb (85.7 kg)  04/18/17 189  lb (85.7 kg)     Physical Exam Pt is alert and oriented PERRL EOMI HEART IS RRR no murmur or rub LCTA no wheezing or rales MUSCULOSKELETAL reveals some paraspinous muscle tenderness but no overt trigger points.  Her lower extremity strength and function.  Baseline.  She does have 1 trigger point in the lower lumbar region  Labs  No results found for: HGBA1C Lab Results  Component Value Date   LDLCALC 47 03/18/2017   CREATININE 0.87 03/18/2017    -------------------------------------------------------------------------------------------------------------------- Lab Results  Component Value Date   WBC 6.0 03/18/2017   HGB 13.4 03/18/2017   HCT 39.4 03/18/2017   PLT 223 03/18/2017   GLUCOSE 97 03/18/2017   CHOL 122 03/18/2017   TRIG 163 (H) 03/18/2017    HDL 42 03/18/2017   LDLCALC 47 03/18/2017   ALT 58 (H) 03/18/2017   AST 53 (H) 03/18/2017   NA 145 (H) 03/18/2017   K 4.3 03/18/2017   CL 103 03/18/2017   CREATININE 0.87 03/18/2017   BUN 19 03/18/2017   CO2 24 03/18/2017   TSH 4.730 (H) 03/18/2017    --------------------------------------------------------------------------------------------------------------------- Dg C-arm 1-60 Min-no Report  Result Date: 04/14/2017 Fluoroscopy was utilized by the requesting physician.  No radiographic interpretation.     Assessment & Plan:   Vernida was seen today for back pain and generalized body aches.  Diagnoses and all orders for this visit:  DDD (degenerative disc disease), lumbar  Chronic pain syndrome  Facet syndrome, lumbar  Fibromyalgia  Primary osteoarthritis of both shoulders  Sacroiliac joint disease  Chronic bilateral low back pain with left-sided sciatica  Chronic left-sided low back pain without sciatica -     INJECT TRIGGER POINT, 1 OR 2  Other orders -     Discontinue: HYDROcodone-acetaminophen (NORCO/VICODIN) 5-325 MG tablet; Take 1 tablet by mouth at bedtime. Limit 1-2 tablets by mouth per day if tolerated -     HYDROcodone-acetaminophen (NORCO/VICODIN) 5-325 MG tablet; Take 1 tablet by mouth at bedtime. Limit 1-2 tablets by mouth per day if tolerated -     traMADol (ULTRAM) 50 MG tablet; Take 1 tablet (50 mg total) by mouth every 6 (six) hours as needed. -     diazepam (VALIUM) 5 MG tablet; Limit  1/2  tablet by mouth per day or every other day if tolerated        ----------------------------------------------------------------------------------------------------------------------  Problem List Items Addressed This Visit      Unprioritized   Chronic low back pain (Chronic)   Relevant Medications   HYDROcodone-acetaminophen (NORCO/VICODIN) 5-325 MG tablet   traMADol (ULTRAM) 50 MG tablet   Other Relevant Orders   INJECT TRIGGER POINT, 1 OR 2    Chronic pain syndrome (Chronic)   DDD (degenerative disc disease), lumbar - Primary (Chronic)   Relevant Medications   HYDROcodone-acetaminophen (NORCO/VICODIN) 5-325 MG tablet   traMADol (ULTRAM) 50 MG tablet   DJD of shoulder   Relevant Medications   HYDROcodone-acetaminophen (NORCO/VICODIN) 5-325 MG tablet   traMADol (ULTRAM) 50 MG tablet   Facet syndrome, lumbar   Relevant Medications   HYDROcodone-acetaminophen (NORCO/VICODIN) 5-325 MG tablet   traMADol (ULTRAM) 50 MG tablet   Fibromyalgia   Sacroiliac joint disease (Chronic)        ----------------------------------------------------------------------------------------------------------------------  1. DDD (degenerative disc disease), lumbar We will have her continue with her current stretching strengthening regimen.  This seems to be working well.  2. Chronic pain syndrome In regards to the chronicity of her low back pain we will  continue her on her current regimen with tramadol and once a day hydrocodone.  We have reviewed the [facility] practitioner database information and it is appropriate and refills will be given for May 26 and June 25.  She is to return to clinic in 2 months.  3. Facet syndrome, lumbar Continue core stretching strengthening exercises as reviewed  4. Fibromyalgia As above  5. Primary osteoarthritis of both shoulders   6. Sacroiliac joint disease   7. Chronic bilateral low back pain with left-sided sciatica   8. Chronic left-sided low back pain without sciatica We will have her return to clinic as mentioned and if her pain persists we will plan for a trigger point injection as reviewed with her in full detail today.  Want her to continue in the meantime with stretching strengthening exercises. - INJECT TRIGGER POINT, 1 OR 2    ----------------------------------------------------------------------------------------------------------------------  I have discontinued [place].  [facility]'s chlorhexidine. I am also having her maintain her esomeprazole, losartan, pravastatin, aspirin EC, Diclofenac-miSOPROStol, tolterodine, MULTI-VITAMINS, NON FORMULARY, cyclobenzaprine, levothyroxine, pregabalin, citalopram, HYDROcodone-acetaminophen, traMADol, and diazepam.   Meds ordered this encounter  Medications  . DISCONTD: HYDROcodone-acetaminophen (NORCO/VICODIN) 5-325 MG tablet    Sig: Take 1 tablet by mouth at bedtime. Limit 1-2 tablets by mouth per day if tolerated    Dispense:  30 tablet    Refill:  0    Do Not Fill Until [id number]  . HYDROcodone-acetaminophen (NORCO/VICODIN) 5-325 MG tablet    Sig: Take 1 tablet by mouth at bedtime. Limit 1-2 tablets by mouth per day if tolerated    Dispense:  30 tablet    Refill:  0    Do Not Fill Until [id number]  . traMADol (ULTRAM) 50 MG tablet    Sig: Take 1 tablet (50 mg total) by mouth every 6 (six) hours as needed.    Dispense:  90 tablet    Refill:  3    Do not fill until [id number]  . diazepam (VALIUM) 5 MG tablet    Sig: Limit  1/2  tablet by mouth per day or every other day if tolerated    Dispense:  15 tablet    Refill:  1    Needs to last one month   Patient's Medications  New Prescriptions   No medications on file  Previous Medications   ASPIRIN EC 81 MG TABLET    Take 81 mg by mouth daily.   CITALOPRAM (CELEXA) 20 MG TABLET    TAKE 1 TABLET BY MOUTH  DAILY   CYCLOBENZAPRINE (FLEXERIL) 10 MG TABLET    Take 1 tablet (10 mg total) by mouth 3 (three) times daily.   DICLOFENAC-MISOPROSTOL 75-0.2 MG TBEC    TAKE 1 TABLET BY MOUTH TWO  TIMES DAILY   ESOMEPRAZOLE (NEXIUM) 20 MG CAPSULE    Take 40 mg by mouth daily. Over the Counter   LEVOTHYROXINE (SYNTHROID, LEVOTHROID) 88 MCG TABLET    TAKE 1 TABLET BY MOUTH  DAILY   LOSARTAN (COZAAR) 50 MG TABLET    Take 50 mg by mouth daily.   MULTIPLE VITAMIN (MULTI-VITAMINS) TABS    Take by mouth.   NON FORMULARY       PRAVASTATIN (PRAVACHOL) 80 MG TABLET    Take 80 mg by mouth  daily.   PREGABALIN (LYRICA) 50 MG CAPSULE    Take 1 capsule (50 mg total) by mouth 3 (three) times daily.   TOLTERODINE (DETROL LA) 4 MG 24 HR CAPSULE    Take 1  capsule (4 mg total) by mouth daily.  Modified Medications   Modified Medication Previous Medication   DIAZEPAM (VALIUM) 5 MG TABLET diazepam (VALIUM) 5 MG tablet      Limit  1/2  tablet by mouth per day or every other day if tolerated    Limit  1/2  tablet by mouth per day or every other day if tolerated   HYDROCODONE-ACETAMINOPHEN (NORCO/VICODIN) 5-325 MG TABLET HYDROcodone-acetaminophen (NORCO/VICODIN) 5-325 MG tablet      Take 1 tablet by mouth at bedtime. Limit 1-2 tablets by mouth per day if tolerated    Take 1 tablet by mouth at bedtime. Limit 1-2 tablets by mouth per day if tolerated   TRAMADOL (ULTRAM) 50 MG TABLET traMADol (ULTRAM) 50 MG tablet      Take 1 tablet (50 mg total) by mouth every 6 (six) hours as needed.    Take 1 tablet (50 mg total) by mouth every 6 (six) hours as needed.  Discontinued Medications   CHLORHEXIDINE (PERIDEX) 0.12 % SOLUTION    RINSE 1/2 OUNCE BID FOR 30 SECONDS   ----------------------------------------------------------------------------------------------------------------------  Follow-up: Return for evaluation, procedure.    Molli Barrows, MD

## 2017-08-25 DIAGNOSIS — I10 Essential (primary) hypertension: Secondary | ICD-10-CM | POA: Diagnosis not present

## 2017-09-14 ENCOUNTER — Other Ambulatory Visit: Payer: Self-pay | Admitting: Surgery

## 2017-09-14 DIAGNOSIS — M7582 Other shoulder lesions, left shoulder: Secondary | ICD-10-CM | POA: Diagnosis not present

## 2017-09-14 DIAGNOSIS — J019 Acute sinusitis, unspecified: Secondary | ICD-10-CM | POA: Diagnosis not present

## 2017-09-14 DIAGNOSIS — M25512 Pain in left shoulder: Secondary | ICD-10-CM | POA: Diagnosis not present

## 2017-09-14 DIAGNOSIS — H60339 Swimmer's ear, unspecified ear: Secondary | ICD-10-CM | POA: Diagnosis not present

## 2017-09-14 DIAGNOSIS — H6122 Impacted cerumen, left ear: Secondary | ICD-10-CM | POA: Diagnosis not present

## 2017-09-19 ENCOUNTER — Ambulatory Visit (INDEPENDENT_AMBULATORY_CARE_PROVIDER_SITE_OTHER): Payer: Medicare Other | Admitting: Internal Medicine

## 2017-09-19 ENCOUNTER — Encounter: Payer: Self-pay | Admitting: Internal Medicine

## 2017-09-19 VITALS — BP 160/98 | HR 97 | Resp 16 | Ht 68.0 in | Wt 187.0 lb

## 2017-09-19 DIAGNOSIS — F331 Major depressive disorder, recurrent, moderate: Secondary | ICD-10-CM | POA: Diagnosis not present

## 2017-09-19 DIAGNOSIS — M797 Fibromyalgia: Secondary | ICD-10-CM | POA: Diagnosis not present

## 2017-09-19 MED ORDER — DULOXETINE HCL 60 MG PO CPEP: 60.0000 mg | ORAL_CAPSULE | Freq: Every day | ORAL | 3 refills | Status: DC

## 2017-09-19 NOTE — Progress Notes (Signed)
Date:  09/19/2017   Name:  Carrie Myers   DOB:  1945-07-29   MRN:  [id number]   Chief Complaint: Depression (pt stated crying everyday and getting worse---1 month)  Depression         This is a chronic problem.  The problem has been gradually worsening since onset.  Associated symptoms include decreased concentration, fatigue, hopelessness, decreased interest, appetite change and sad.  Associated symptoms include no headaches and no suicidal ideas.  Past treatments include SSRIs - Selective serotonin reuptake inhibitors.  Compliance with treatment is good.  Previous treatment provided moderate relief.  Fibromyalgia -  Seeing pain management, planning a trigger point muscle injection in the near future. Taking hydrocodone or tramadol several times a week.  Not able to exercise due to pain.  Review of Systems  Constitutional: Positive for appetite change and fatigue. Negative for unexpected weight change.  Respiratory: Negative for choking, chest tightness, shortness of breath and wheezing.   Cardiovascular: Negative for chest pain and palpitations.  Gastrointestinal: Negative for abdominal pain, constipation and diarrhea.  Musculoskeletal: Positive for arthralgias and back pain.  Neurological: Negative for dizziness and headaches.  Hematological: Negative for adenopathy.  Psychiatric/Behavioral: Positive for decreased concentration, depression, dysphoric mood and sleep disturbance (vivid dreams but not nightmares). Negative for suicidal ideas.    Patient Active Problem List   Diagnosis Date Noted  . Hoarseness, persistent 04/18/2017  . OSA on CPAP 02/18/2017  . Cervical disc disease 12/31/2016  . Generalized osteoarthritis 12/23/2016  . Chronic pain syndrome 12/23/2016  . Chronic low back pain 12/23/2016  . Cystocele, midline 12/06/2016  . Urinary incontinence 12/06/2016  . Status post shoulder surgery 05/27/2015  . Impingement syndrome of right shoulder 04/17/2015  . Other  shoulder lesions, right shoulder 04/17/2015  . Fibrocystic breast 01/18/2015  . Gastroesophageal reflux disease without esophagitis 01/18/2015  . Hypothyroidism, postablative 01/18/2015  . Fracture of right foot 01/14/2015  . Arteriosclerosis of coronary artery 12/29/2014  . DJD of shoulder 11/13/2014  . Fibromyalgia 10/02/2014  . Intercostal neuralgia 10/02/2014  . DDD (degenerative disc disease), lumbar 10/02/2014  . Facet syndrome, lumbar 10/02/2014  . Sacroiliac joint disease 10/02/2014  . Carotid artery narrowing 07/09/2014  . Benign essential HTN 07/01/2014  . Osteoarthritis of thumb 09/24/2012  . Mixed hyperlipidemia 09/22/2012  . Arthritis of hand, degenerative 09/22/2012    Prior to Admission medications   Medication Sig Start Date End Date Taking? Authorizing Provider  aspirin EC 81 MG tablet Take 81 mg by mouth daily.   Yes [provider]  citalopram (CELEXA) 20 MG tablet TAKE 1 TABLET BY MOUTH  DAILY 04/28/17  Yes [name], MD  cyclobenzaprine (FLEXERIL) 10 MG tablet Take 1 tablet (10 mg total) by mouth 3 (three) times daily. 02/18/17  Yes [name], MD  diazepam (VALIUM) 5 MG tablet Limit  1/2  tablet by mouth per day or every other day if tolerated 08/03/17  Yes Molli Barrows, MD  Diclofenac-Misoprostol 75-0.2 MG TBEC TAKE 1 TABLET BY MOUTH TWO  TIMES DAILY 12/29/16  Yes [name], MD  esomeprazole (NEXIUM) 20 MG capsule Take 40 mg by mouth daily. Over the Counter   Yes [provider]  levofloxacin (LEVAQUIN) 750 MG tablet Take 1 tablet by mouth. 09/14/17  Yes [provider]  levothyroxine (SYNTHROID, LEVOTHROID) 88 MCG tablet TAKE 1 TABLET BY MOUTH  DAILY 02/21/17  Yes [name], MD  losartan (COZAAR) 50 MG tablet Take  50 mg by mouth daily.   Yes [provider]  Multiple Vitamin (MULTI-VITAMINS) TABS Take by mouth.   Yes [provider]  NON FORMULARY    Yes [provider]    pravastatin (PRAVACHOL) 80 MG tablet Take 80 mg by mouth daily.   Yes [provider]  tolterodine (DETROL LA) 4 MG 24 hr capsule Take 1 capsule (4 mg total) by mouth daily. 01/05/17  Yes Gae Dry, MD  traMADol (ULTRAM) 50 MG tablet Take 1 tablet (50 mg total) by mouth every 6 (six) hours as needed. 08/03/17  Yes Molli Barrows, MD  HYDROcodone-acetaminophen (NORCO/VICODIN) 5-325 MG tablet Take 1 tablet by mouth at bedtime. Limit 1-2 tablets by mouth per day if tolerated 08/03/17 09/02/17  Molli Barrows, MD    Allergies  Allergen Reactions  . Augmentin [Amoxicillin-Pot Clavulanate] Nausea And Vomiting  . Codeine Nausea And Vomiting  . Sulfa Antibiotics Nausea And Vomiting  . Shellfish Allergy Nausea And Vomiting, Nausea Only and Other (See Comments)    No issues with iodine     Past Surgical History:  Procedure Laterality Date  . ABDOMINAL HYSTERECTOMY     total  . APPENDECTOMY    . BREAST BIOPSY Right    neg  . BREAST CYST ASPIRATION Left    neg  . BUNIONECTOMY Bilateral   . CESAREAN SECTION    . COLONOSCOPY  2010   normal  . CORONARY ANGIOPLASTY WITH STENT PLACEMENT  04/2006  . dental implant     [age]  . epidural steroid injection  2018  . SHOULDER ARTHROSCOPY Right 05/07/2015   Procedure: Extensive arthroscopic debridement and arthroscopic subacromial decompression, right shoulder.  2. Steroid injection right thumb CMC joint.;  Surgeon: Corky Mull, MD;  Location: [facility];  Service: Orthopedics;  Laterality: Right;    Social History   Tobacco Use  . Smoking status: Former Smoker    Types: Cigarettes  . Smokeless tobacco: Former Systems developer    Quit date: 04/22/2006  . Tobacco comment: quit 8 years  Substance Use Topics  . Alcohol use: No    Alcohol/week: 0.0 oz  . Drug use: No     Medication list has been reviewed and updated.  Current Meds  Medication Sig  . aspirin EC 81 MG tablet Take 81 mg by mouth daily.  .  citalopram (CELEXA) 20 MG tablet TAKE 1 TABLET BY MOUTH  DAILY  . cyclobenzaprine (FLEXERIL) 10 MG tablet Take 1 tablet (10 mg total) by mouth 3 (three) times daily.  . diazepam (VALIUM) 5 MG tablet Limit  1/2  tablet by mouth per day or every other day if tolerated  . Diclofenac-Misoprostol 75-0.2 MG TBEC TAKE 1 TABLET BY MOUTH TWO  TIMES DAILY  . esomeprazole (NEXIUM) 20 MG capsule Take 40 mg by mouth daily. Over the Counter  . levofloxacin (LEVAQUIN) 750 MG tablet Take 1 tablet by mouth.  . levothyroxine (SYNTHROID, LEVOTHROID) 88 MCG tablet TAKE 1 TABLET BY MOUTH  DAILY  . losartan (COZAAR) 50 MG tablet Take 50 mg by mouth daily.  . Multiple Vitamin (MULTI-VITAMINS) TABS Take by mouth.  . NON FORMULARY   . pravastatin (PRAVACHOL) 80 MG tablet Take 80 mg by mouth daily.  Marland Kitchen tolterodine (DETROL LA) 4 MG 24 hr capsule Take 1 capsule (4 mg total) by mouth daily.  . traMADol (ULTRAM) 50 MG tablet Take 1 tablet (50 mg total) by mouth every 6 (six) hours as needed.  PHQ 2/9 Scores 09/19/2017 06/08/2017 04/14/2017 03/24/2017  PHQ - 2 Score 4 0 0 0  PHQ- 9 Score 10 - - -  Exception Documentation - - - -    Physical Exam  Constitutional: She is oriented to person, place, and time. She appears well-developed. She appears distressed.  Neck: Normal range of motion. Neck supple.  Cardiovascular: Normal rate, regular rhythm and normal heart sounds.  Pulmonary/Chest: Effort normal and breath sounds normal.  Neurological: She is alert and oriented to person, place, and time.  Psychiatric: Her speech is normal and behavior is normal. Her mood appears anxious. Thought content is not paranoid. Cognition and memory are normal. She exhibits a depressed mood. She expresses no suicidal ideation. She expresses no suicidal plans.    BP (!) 160/98   Pulse 97   Resp 16   Ht 5\' 8"  (1.727 m)   Wt 187 lb (84.8 kg)   SpO2 97%   BMI 28.43 kg/m   Assessment and Plan: 1. Moderate episode of recurrent major  depressive disorder (HCC) Stop citalopram and begin cymbalta Precautions given Follow up in 2 weeks - DULoxetine (CYMBALTA) 60 MG capsule; Take 1 capsule (60 mg total) by mouth daily.  Dispense: 30 capsule; Refill: 3  2. Fibromyalgia Cymbalta may help pain Continue follow up with Dr. Andree Elk   Meds ordered this encounter  Medications  . DULoxetine (CYMBALTA) 60 MG capsule    Sig: Take 1 capsule (60 mg total) by mouth daily.    Dispense:  30 capsule    Refill:  3    Partially dictated using Editor, commissioning. Any errors are unintentional.  Halina Maidens, MD [facility]  09/19/2017

## 2017-09-19 NOTE — Patient Instructions (Addendum)
Suicidal Feelings: How to Help Yourself  Suicide is the taking of one's own life. If you feel as though life is getting too tough to handle and are thinking about suicide, get help right away. To get help:  Call your local emergency services (911 in the [place]  Call a suicide hotline to speak with a trained counselor who understands how you are feeling. The following is a list of suicide hotlines in the [place]. For a list of hotlines in [place], visit www.suicide.org/hotlines/international/[place]-suicide-hotlines.html.  1-800-273-TALK (1-800-273-8255).  1-800-SUICIDE (1-800-784-2433).  1-888-628-9454. This is a hotline for Spanish speakers.  1-800-799-4TTY (1-800-799-4889). This is a hotline for TTY users.  1-866-4-U-TREVOR (1-866-488-7386). This is a hotline for lesbian, gay, bisexual, transgender, or questioning youth.  Contact a crisis center or a local suicide prevention center. To find a crisis center or suicide prevention center:  Call your local hospital, clinic, community service organization, mental health center, social service provider, or health department. Ask for assistance in connecting to a crisis center.  Visit www.suicidepreventionlifeline.org/getinvolved/locator for a list of crisis centers in the [place], or visit www.suicideprevention.ca/thinking-about-suicide/find-a-crisis-centre for a list of centers in [place].  Visit the following websites:  National Suicide Prevention Lifeline: www.suicidepreventionlifeline.org  Hopeline: www.hopeline.com  American Foundation for Suicide Prevention: www.afsp.org  The Trevor Project (for lesbian, gay, bisexual, transgender, or questioning youth): www.thetrevorproject.org    How can I help myself feel better?  Promise yourself that you will not do anything drastic when you have suicidal feelings. Remember, there is hope. Many people have gotten through suicidal thoughts and feelings, and you will, too. You may have gotten through them before, and  this proves that you can get through them again.  Let family, friends, teachers, or counselors know how you are feeling. Try not to isolate yourself from those who care about you. Remember, they will want to help you. Talk with someone every day, even if you do not feel sociable. Face-to-face conversation is best.  Call a mental health professional and see one regularly.  Visit your primary health care provider every year.  Eat a well-balanced diet, and space your meals so you eat regularly.  Get plenty of rest.  Avoid alcohol and drugs, and remove them from your home. They will only make you feel worse.  If you are thinking of taking a lot of medicine, give your medicine to someone who can give it to you one day at a time. If you are on antidepressants and are concerned you will overdose, let your health care provider know so he or she can give you safer medicines. Ask your mental health professional about the possible side effects of any medicines you are taking.  Remove weapons, poisons, knives, and anything else that could harm you from your home.  Try to stick to routines. Follow a schedule every day. Put self-care on your schedule.  Make a list of realistic goals, and cross them off when you achieve them. Accomplishments give a sense of worth.  Wait until you are feeling better before doing the things you find difficult or unpleasant.  Exercise if you are able. You will feel better if you exercise for even a half hour each day.  Go out in the sun or into nature. This will help you recover from depression faster. If you have a favorite place to walk, go there.  Do the things that have always given you pleasure. Play your favorite music, read a good book, paint a picture, play your favorite   instrument, or do anything else that takes your mind off your depression if it is safe to do.  Keep your living space well lit.  When you are feeling well, write yourself a letter about tips and support that you can read when  you are not feeling well.  Remember that life's difficulties can be sorted out with help. Conditions can be treated. You can work on thoughts and strategies that serve you well.  This information is not intended to replace advice given to you by your health care provider. Make sure you discuss any questions you have with your health care provider.  Document Released: 09/05/2002 Document Revised: 10/29/2015 Document Reviewed: 06/26/2013  Elsevier Interactive Patient Education  2018 [employer]vier Inc.

## 2017-09-28 ENCOUNTER — Encounter: Payer: Self-pay | Admitting: Anesthesiology

## 2017-09-28 ENCOUNTER — Ambulatory Visit: Payer: Medicare Other | Attending: Anesthesiology | Admitting: Anesthesiology

## 2017-09-28 ENCOUNTER — Other Ambulatory Visit: Payer: Self-pay

## 2017-09-28 VITALS — BP 119/82 | HR 100 | Temp 98.3°F | Ht 68.0 in | Wt 188.0 lb

## 2017-09-28 DIAGNOSIS — G894 Chronic pain syndrome: Secondary | ICD-10-CM | POA: Diagnosis not present

## 2017-09-28 DIAGNOSIS — M5136 Other intervertebral disc degeneration, lumbar region: Secondary | ICD-10-CM

## 2017-09-28 DIAGNOSIS — G8929 Other chronic pain: Secondary | ICD-10-CM

## 2017-09-28 DIAGNOSIS — M5442 Lumbago with sciatica, left side: Secondary | ICD-10-CM | POA: Diagnosis not present

## 2017-09-28 DIAGNOSIS — Z79899 Other long term (current) drug therapy: Secondary | ICD-10-CM | POA: Insufficient documentation

## 2017-09-28 DIAGNOSIS — Z7982 Long term (current) use of aspirin: Secondary | ICD-10-CM | POA: Insufficient documentation

## 2017-09-28 DIAGNOSIS — M797 Fibromyalgia: Secondary | ICD-10-CM | POA: Insufficient documentation

## 2017-09-28 DIAGNOSIS — M47816 Spondylosis without myelopathy or radiculopathy, lumbar region: Secondary | ICD-10-CM

## 2017-09-28 DIAGNOSIS — M5441 Lumbago with sciatica, right side: Secondary | ICD-10-CM | POA: Insufficient documentation

## 2017-09-28 DIAGNOSIS — M545 Low back pain: Secondary | ICD-10-CM

## 2017-09-28 MED ORDER — DEXAMETHASONE SODIUM PHOSPHATE 10 MG/ML IJ SOLN
10.0000 mg | Freq: Once | INTRAMUSCULAR | Status: AC
  Administered 2017-09-28: 10 mg

## 2017-09-28 MED ORDER — HYDROCODONE-ACETAMINOPHEN 5-325 MG PO TABS: 1.0000 | ORAL_TABLET | Freq: Every day | ORAL | 0 refills | Status: DC

## 2017-09-28 MED ORDER — DEXAMETHASONE SODIUM PHOSPHATE 10 MG/ML IJ SOLN
INTRAMUSCULAR | Status: AC
  Filled 2017-09-28: qty 1

## 2017-09-28 MED ORDER — DIAZEPAM 5 MG PO TABS: ORAL_TABLET | ORAL | 1 refills | Status: DC

## 2017-09-28 MED ORDER — ROPIVACAINE HCL 2 MG/ML IJ SOLN
10.0000 mL | Freq: Once | INTRAMUSCULAR | Status: AC
  Administered 2017-09-28: 10 mL via EPIDURAL

## 2017-09-28 MED ORDER — ROPIVACAINE HCL 2 MG/ML IJ SOLN
INTRAMUSCULAR | Status: AC
  Filled 2017-09-28: qty 10

## 2017-09-28 NOTE — Patient Instructions (Signed)

## 2017-09-28 NOTE — Progress Notes (Signed)
Nursing Pain Medication Assessment:  Safety precautions to be maintained throughout the outpatient stay will include: orient to surroundings, keep bed in low position, maintain call bell within reach at all times, provide assistance with transfer out of bed and ambulation.  Medication Inspection Compliance: Pill count conducted under aseptic conditions, in front of the patient. Neither the pills nor the bottle was removed from the patient's sight at any time. Once count was completed pills were immediately returned to the patient in their original bottle.  Medication #1: Hydrocodone/APAP Pill/Patch Count: 20 of 30 pills remain Pill/Patch Appearance: Markings consistent with prescribed medication Bottle Appearance: Standard pharmacy container. Clearly labeled. Filled Date: 07/06 / 2019 Last Medication intake:  Yesterday  Medication #2: Tramadol (Ultram) Pill/Patch Count: 45 of 90 pills remain Pill/Patch Appearance: Markings consistent with prescribed medication Bottle Appearance: Standard pharmacy container. Clearly labeled. Filled Date: 07/02 / 2019 Last Medication intake:  Today

## 2017-09-28 NOTE — Progress Notes (Signed)
Subjective:  Patient ID: Carrie Myers, female    DOB: 1945/04/23  Age: 72 y.o. MRN: [id number]  CC: No chief complaint on file.   Procedure: Left L5 trigger point injection x2  HPI Carrie Myers presents for reevaluation.  She is lasting several weeks ago and continues to have pain in the low back especially on the left side.  She is been doing well in regards to her lower extremity function.  But the left side pain is been severe.  We have previously talked about a trigger point injection she desires to proceed with that today.  Otherwise she is been doing well with her medications.  She takes Valium one half of a 5 mg tablet daily to help with muscle spasm and this works well.  She is also taking some Vicodin on a daily or every other day basis to help with pain relief.  She is doing her physical therapy exercises staying active.  Her medications seem to work well based on her narcotic assessment sheet.  She continues to derive good functional lifestyle improvement with these medicines.  No side effects are noted.  She did recently have some depression and this is been treated by her medical doctors.  She denies any homicidal or suicidal ideation.  Outpatient Medications Prior to Visit  Medication Sig Dispense Refill  . aspirin EC 81 MG tablet Take 81 mg by mouth daily.    . citalopram (CELEXA) 20 MG tablet TAKE 1 TABLET BY MOUTH  DAILY 90 tablet 3  . cyclobenzaprine (FLEXERIL) 10 MG tablet Take 1 tablet (10 mg total) by mouth 3 (three) times daily. 270 tablet 0  . Diclofenac-Misoprostol 75-0.2 MG TBEC TAKE 1 TABLET BY MOUTH TWO  TIMES DAILY 180 tablet 1  . DULoxetine (CYMBALTA) 60 MG capsule Take 1 capsule (60 mg total) by mouth daily. 30 capsule 3  . esomeprazole (NEXIUM) 20 MG capsule Take 40 mg by mouth daily. Over the Counter    . levothyroxine (SYNTHROID, LEVOTHROID) 88 MCG tablet TAKE 1 TABLET BY MOUTH  DAILY 90 tablet 3  . losartan (COZAAR) 50 MG tablet Take 50 mg by mouth  daily.    . Multiple Vitamin (MULTI-VITAMINS) TABS Take by mouth.    . NON FORMULARY     . pravastatin (PRAVACHOL) 80 MG tablet Take 80 mg by mouth daily.    Marland Kitchen tolterodine (DETROL LA) 4 MG 24 hr capsule Take 1 capsule (4 mg total) by mouth daily. 30 capsule 11  . traMADol (ULTRAM) 50 MG tablet Take 1 tablet (50 mg total) by mouth every 6 (six) hours as needed. 90 tablet 3  . diazepam (VALIUM) 5 MG tablet Limit  1/2  tablet by mouth per day or every other day if tolerated 15 tablet 1  . HYDROcodone-acetaminophen (NORCO/VICODIN) 5-325 MG tablet Take 1 tablet by mouth at bedtime. Limit 1-2 tablets by mouth per day if tolerated 30 tablet 0   No facility-administered medications prior to visit.     Review of Systems CNS: No confusion or sedation Cardiac: No angina or palpitations GI: No abdominal pain or constipation Anesthesia: No nausea vomiting fevers chills  Objective:  BP 119/82   Pulse 100   Temp 98.3 F (36.8 C)   Ht 5\' 8"  (1.727 m)   Wt 188 lb (85.3 kg)   SpO2 97%   BMI 28.59 kg/m    BP Readings from Last 3 Encounters:  09/28/17 119/82  09/19/17 (!) 160/98  08/03/17 (!) 133/94  Wt Readings from Last 3 Encounters:  09/28/17 188 lb (85.3 kg)  09/19/17 187 lb (84.8 kg)  08/03/17 190 lb (86.2 kg)     Physical Exam Pt is alert and oriented PERRL EOMI HEART IS RRR no murmur or rub LCTA no wheezing or rales MUSCULOSKELETAL he has 2 trigger points about 5 cm to the left of L5 lumbar region.  Her lower extremity muscle tone and bulk is at baseline.  She is ambulating well.  Labs  No results found for: HGBA1C Lab Results  Component Value Date   LDLCALC 47 03/18/2017   CREATININE 0.87 03/18/2017    -------------------------------------------------------------------------------------------------------------------- Lab Results  Component Value Date   WBC 6.0 03/18/2017   HGB 13.4 03/18/2017   HCT 39.4 03/18/2017   PLT 223 03/18/2017   GLUCOSE 97 03/18/2017    CHOL 122 03/18/2017   TRIG 163 (H) 03/18/2017   HDL 42 03/18/2017   LDLCALC 47 03/18/2017   ALT 58 (H) 03/18/2017   AST 53 (H) 03/18/2017   NA 145 (H) 03/18/2017   K 4.3 03/18/2017   CL 103 03/18/2017   CREATININE 0.87 03/18/2017   BUN 19 03/18/2017   CO2 24 03/18/2017   TSH 4.730 (H) 03/18/2017    --------------------------------------------------------------------------------------------------------------------- Dg C-arm 1-60 Min-no Report  Result Date: 04/14/2017 Fluoroscopy was utilized by the requesting physician.  No radiographic interpretation.     Assessment & Plan:   Diagnoses and all orders for this visit:  DDD (degenerative disc disease), lumbar  Chronic pain syndrome  Facet syndrome, lumbar  Fibromyalgia  Chronic bilateral low back pain with bilateral sciatica  Chronic bilateral low back pain without sciatica  Other orders -     HYDROcodone-acetaminophen (NORCO/VICODIN) 5-325 MG tablet; Take 1 tablet by mouth at bedtime. Limit 1-2 tablets by mouth per day if tolerated -     dexamethasone (DECADRON) injection 10 mg -     ropivacaine (PF) 2 mg/mL (0.2%) (NAROPIN) injection 10 mL -     diazepam (VALIUM) 5 MG tablet; Limit  1/2  tablet by mouth per day or every other day if tolerated        ----------------------------------------------------------------------------------------------------------------------  Problem List Items Addressed This Visit      Unprioritized   Chronic low back pain (Chronic)   Relevant Medications   HYDROcodone-acetaminophen (NORCO/VICODIN) 5-325 MG tablet   dexamethasone (DECADRON) injection 10 mg (Completed)   Chronic pain syndrome (Chronic)   DDD (degenerative disc disease), lumbar - Primary (Chronic)   Relevant Medications   HYDROcodone-acetaminophen (NORCO/VICODIN) 5-325 MG tablet   dexamethasone (DECADRON) injection 10 mg (Completed)   Facet syndrome, lumbar   Relevant Medications   HYDROcodone-acetaminophen  (NORCO/VICODIN) 5-325 MG tablet   dexamethasone (DECADRON) injection 10 mg (Completed)   Fibromyalgia        ----------------------------------------------------------------------------------------------------------------------  1. DDD (degenerative disc disease), lumbar Continue with stretching strengthening exercises and efforts at weight loss.  2. Chronic pain syndrome Reviewed the [facility] practitioner database information and it is appropriate.  We will refill her medications for Valium and Vicodin today.  3. Facet syndrome, lumbar Continue as above  4. Fibromyalgia As above  5. Chronic bilateral low back pain with bilateral sciatica As above  6. Chronic bilateral low back pain without sciatica Proceed with a trigger point injection to the after mentioned levels today.  The risks and benefits of been reviewed in full detail.  She is scheduled to return to clinic in 1 month.  For the same procedure.    ----------------------------------------------------------------------------------------------------------------------  I am having Carrie Myers maintain her esomeprazole, losartan, pravastatin, aspirin EC, Diclofenac-miSOPROStol, tolterodine, MULTI-VITAMINS, NON FORMULARY, cyclobenzaprine, levothyroxine, citalopram, traMADol, DULoxetine, HYDROcodone-acetaminophen, and diazepam. We administered dexamethasone and ropivacaine (PF) 2 mg/mL (0.2%).   Meds ordered this encounter  Medications  . HYDROcodone-acetaminophen (NORCO/VICODIN) 5-325 MG tablet    Sig: Take 1 tablet by mouth at bedtime. Limit 1-2 tablets by mouth per day if tolerated    Dispense:  30 tablet    Refill:  0    Do Not Fill Until [id number]  . dexamethasone (DECADRON) injection 10 mg  . ropivacaine (PF) 2 mg/mL (0.2%) (NAROPIN) injection 10 mL  . diazepam (VALIUM) 5 MG tablet    Sig: Limit  1/2  tablet by mouth per day or every other day if tolerated    Dispense:  15 tablet    Refill:  1     Needs to last one month   Patient's Medications  New Prescriptions   No medications on file  Previous Medications   ASPIRIN EC 81 MG TABLET    Take 81 mg by mouth daily.   CITALOPRAM (CELEXA) 20 MG TABLET    TAKE 1 TABLET BY MOUTH  DAILY   CYCLOBENZAPRINE (FLEXERIL) 10 MG TABLET    Take 1 tablet (10 mg total) by mouth 3 (three) times daily.   DICLOFENAC-MISOPROSTOL 75-0.2 MG TBEC    TAKE 1 TABLET BY MOUTH TWO  TIMES DAILY   DULOXETINE (CYMBALTA) 60 MG CAPSULE    Take 1 capsule (60 mg total) by mouth daily.   ESOMEPRAZOLE (NEXIUM) 20 MG CAPSULE    Take 40 mg by mouth daily. Over the Counter   LEVOTHYROXINE (SYNTHROID, LEVOTHROID) 88 MCG TABLET    TAKE 1 TABLET BY MOUTH  DAILY   LOSARTAN (COZAAR) 50 MG TABLET    Take 50 mg by mouth daily.   MULTIPLE VITAMIN (MULTI-VITAMINS) TABS    Take by mouth.   NON FORMULARY       PRAVASTATIN (PRAVACHOL) 80 MG TABLET    Take 80 mg by mouth daily.   TOLTERODINE (DETROL LA) 4 MG 24 HR CAPSULE    Take 1 capsule (4 mg total) by mouth daily.   TRAMADOL (ULTRAM) 50 MG TABLET    Take 1 tablet (50 mg total) by mouth every 6 (six) hours as needed.  Modified Medications   Modified Medication Previous Medication   DIAZEPAM (VALIUM) 5 MG TABLET diazepam (VALIUM) 5 MG tablet      Limit  1/2  tablet by mouth per day or every other day if tolerated    Limit  1/2  tablet by mouth per day or every other day if tolerated   HYDROCODONE-ACETAMINOPHEN (NORCO/VICODIN) 5-325 MG TABLET HYDROcodone-acetaminophen (NORCO/VICODIN) 5-325 MG tablet      Take 1 tablet by mouth at bedtime. Limit 1-2 tablets by mouth per day if tolerated    Take 1 tablet by mouth at bedtime. Limit 1-2 tablets by mouth per day if tolerated  Discontinued Medications   No medications on file   ----------------------------------------------------------------------------------------------------------------------  Follow-up: Return for evaluation, procedure.  Trigger point injection: The area overlying  the aforementioned trigger points were prepped with alcohol. They were then injected with a 25-gauge needle with 4 cc of ropivacaine 0.2% and Decadron 4 mg at each site after negative aspiration for heme. This was performed after informed consent was obtained and risks and benefits reviewed. She tolerated this procedure without difficulty and was convalesced and discharged to home in stable condition  for follow-up as mentioned.  @Milfred Krammes  [place], MD@  Molli Barrows, MD

## 2017-09-29 ENCOUNTER — Telehealth: Payer: Self-pay | Admitting: *Deleted

## 2017-09-29 NOTE — Telephone Encounter (Signed)
No problems post procedure. 

## 2017-10-03 ENCOUNTER — Ambulatory Visit (INDEPENDENT_AMBULATORY_CARE_PROVIDER_SITE_OTHER): Payer: Medicare Other | Admitting: Internal Medicine

## 2017-10-03 ENCOUNTER — Encounter: Payer: Self-pay | Admitting: Internal Medicine

## 2017-10-03 VITALS — BP 138/84 | HR 98 | Ht 68.0 in | Wt 188.0 lb

## 2017-10-03 DIAGNOSIS — M4696 Unspecified inflammatory spondylopathy, lumbar region: Secondary | ICD-10-CM | POA: Diagnosis not present

## 2017-10-03 DIAGNOSIS — F331 Major depressive disorder, recurrent, moderate: Secondary | ICD-10-CM | POA: Insufficient documentation

## 2017-10-03 NOTE — Progress Notes (Signed)
Date:  10/03/2017   Name:  Carrie Myers   DOB:  1945/10/19   MRN:  [id number]   Chief Complaint: Depression Depression         This is a chronic problem.  The onset quality is undetermined.   The problem has been rapidly improving since onset.  Associated symptoms include myalgias.  Associated symptoms include no decreased concentration, no helplessness, no hopelessness, no decreased interest, no headaches, not sad and no suicidal ideas.  Past treatments include SNRIs - Serotonin and norepinephrine reuptake inhibitors.  Compliance with treatment is good.  Previous treatment provided moderate relief.  Past medical history includes chronic pain and fibromyalgia.    She has not seen any change in overall chronic pain.  However, she did get a trigger point injection recently.  She continues on Valium, vicodin and tramadol from pain management.    Review of Systems  Constitutional: Negative for chills, fever and unexpected weight change.  Respiratory: Negative for cough, chest tightness and shortness of breath.   Cardiovascular: Negative for chest pain, palpitations and leg swelling.  Gastrointestinal: Negative for abdominal pain.  Musculoskeletal: Positive for arthralgias, back pain and myalgias.  Neurological: Negative for dizziness and headaches.  Psychiatric/Behavioral: Positive for depression. Negative for agitation, decreased concentration, sleep disturbance and suicidal ideas.    Patient Active Problem List   Diagnosis Date Noted  . Moderate episode of recurrent major depressive disorder ([place]) 10/03/2017  . Inflammatory spondylopathy of lumbar region ([place]) 10/03/2017  . Hoarseness, persistent 04/18/2017  . OSA on CPAP 02/18/2017  . Cervical disc disease 12/31/2016  . Generalized osteoarthritis 12/23/2016  . Chronic pain syndrome 12/23/2016  . Cystocele, midline 12/06/2016  . Urinary incontinence 12/06/2016  . Impingement syndrome of right shoulder 04/17/2015  .  Fibrocystic breast 01/18/2015  . Gastroesophageal reflux disease without esophagitis 01/18/2015  . Hypothyroidism, postablative 01/18/2015  . Arteriosclerosis of coronary artery 12/29/2014  . DJD of shoulder 11/13/2014  . Fibromyalgia 10/02/2014  . Intercostal neuralgia 10/02/2014  . Facet syndrome, lumbar 10/02/2014  . Sacroiliac joint disease 10/02/2014  . Carotid artery narrowing 07/09/2014  . Benign essential HTN 07/01/2014  . Osteoarthritis of thumb 09/24/2012  . Mixed hyperlipidemia 09/22/2012  . Arthritis of hand, degenerative 09/22/2012    Prior to Admission medications   Medication Sig Start Date End Date Taking? Authorizing Provider  aspirin EC 81 MG tablet Take 81 mg by mouth daily.   Yes [provider]  citalopram (CELEXA) 20 MG tablet TAKE 1 TABLET BY MOUTH  DAILY 04/28/17  Yes [name], MD  cyclobenzaprine (FLEXERIL) 10 MG tablet Take 1 tablet (10 mg total) by mouth 3 (three) times daily. 02/18/17  Yes [name], MD  diazepam (VALIUM) 5 MG tablet Limit  1/2  tablet by mouth per day or every other day if tolerated 09/28/17  Yes Molli Barrows, MD  Diclofenac-Misoprostol 75-0.2 MG TBEC TAKE 1 TABLET BY MOUTH TWO  TIMES DAILY 12/29/16  Yes [name], MD  DULoxetine (CYMBALTA) 60 MG capsule Take 1 capsule (60 mg total) by mouth daily. 09/19/17  Yes [name], MD  esomeprazole (NEXIUM) 20 MG capsule Take 40 mg by mouth daily. Over the Counter   Yes [provider]  HYDROcodone-acetaminophen (NORCO/VICODIN) 5-325 MG tablet Take 1 tablet by mouth at bedtime. Limit 1-2 tablets by mouth per day if tolerated 09/28/17 10/28/17 Yes Molli Barrows, MD  levothyroxine (SYNTHROID, LEVOTHROID) 88 MCG tablet TAKE 1 TABLET BY MOUTH  DAILY 02/21/17  Yes [name], MD  losartan (COZAAR) 50 MG tablet Take 50 mg by mouth daily.   Yes [provider]  Multiple Vitamin (MULTI-VITAMINS) TABS Take by mouth.   Yes [provider]    NON FORMULARY    Yes [provider]  pravastatin (PRAVACHOL) 80 MG tablet Take 80 mg by mouth daily.   Yes [provider]  tolterodine (DETROL LA) 4 MG 24 hr capsule Take 1 capsule (4 mg total) by mouth daily. 01/05/17  Yes Gae Dry, MD  traMADol (ULTRAM) 50 MG tablet Take 1 tablet (50 mg total) by mouth every 6 (six) hours as needed. 08/03/17  Yes Molli Barrows, MD    Allergies  Allergen Reactions  . Augmentin [Amoxicillin-Pot Clavulanate] Nausea And Vomiting  . Codeine Nausea And Vomiting  . Sulfa Antibiotics Nausea And Vomiting  . Proparacaine Itching  . Shellfish Allergy Nausea And Vomiting, Nausea Only and Other (See Comments)    No issues with iodine     Past Surgical History:  Procedure Laterality Date  . ABDOMINAL HYSTERECTOMY     total  . APPENDECTOMY    . BREAST BIOPSY Right    neg  . BREAST CYST ASPIRATION Left    neg  . BUNIONECTOMY Bilateral   . CESAREAN SECTION    . COLONOSCOPY  2010   normal  . CORONARY ANGIOPLASTY WITH STENT PLACEMENT  04/2006  . dental implant     [age]  . epidural steroid injection  2018  . SHOULDER ARTHROSCOPY Right 05/07/2015   Procedure: Extensive arthroscopic debridement and arthroscopic subacromial decompression, right shoulder.  2. Steroid injection right thumb CMC joint.;  Surgeon: Corky Mull, MD;  Location: [facility];  Service: Orthopedics;  Laterality: Right;    Social History   Tobacco Use  . Smoking status: Former Smoker    Types: Cigarettes  . Smokeless tobacco: Former Systems developer    Quit date: 04/22/2006  . Tobacco comment: quit 8 years  Substance Use Topics  . Alcohol use: No    Alcohol/week: 0.0 oz  . Drug use: No     Medication list has been reviewed and updated.  Current Meds  Medication Sig  . aspirin EC 81 MG tablet Take 81 mg by mouth daily.  . citalopram (CELEXA) 20 MG tablet TAKE 1 TABLET BY MOUTH  DAILY  . cyclobenzaprine (FLEXERIL) 10 MG tablet  Take 1 tablet (10 mg total) by mouth 3 (three) times daily.  . diazepam (VALIUM) 5 MG tablet Limit  1/2  tablet by mouth per day or every other day if tolerated  . Diclofenac-Misoprostol 75-0.2 MG TBEC TAKE 1 TABLET BY MOUTH TWO  TIMES DAILY  . DULoxetine (CYMBALTA) 60 MG capsule Take 1 capsule (60 mg total) by mouth daily.  Marland Kitchen esomeprazole (NEXIUM) 20 MG capsule Take 40 mg by mouth daily. Over the Counter  . HYDROcodone-acetaminophen (NORCO/VICODIN) 5-325 MG tablet Take 1 tablet by mouth at bedtime. Limit 1-2 tablets by mouth per day if tolerated  . levothyroxine (SYNTHROID, LEVOTHROID) 88 MCG tablet TAKE 1 TABLET BY MOUTH  DAILY  . losartan (COZAAR) 50 MG tablet Take 50 mg by mouth daily.  . Multiple Vitamin (MULTI-VITAMINS) TABS Take by mouth.  . NON FORMULARY   . pravastatin (PRAVACHOL) 80 MG tablet Take 80 mg by mouth daily.  Marland Kitchen tolterodine (DETROL LA) 4 MG 24 hr capsule Take 1 capsule (4 mg total) by mouth daily.  . traMADol (ULTRAM) 50 MG  tablet Take 1 tablet (50 mg total) by mouth every 6 (six) hours as needed.    PHQ 2/9 Scores 10/03/2017 09/28/2017 09/19/2017 06/08/2017  PHQ - 2 Score 0 0 4 0  PHQ- 9 Score 1 - 10 -  Exception Documentation - - - -    Physical Exam  Constitutional: She is oriented to person, place, and time. She appears well-developed. No distress.  HENT:  Head: Normocephalic and atraumatic.  Neck: Normal range of motion. Neck supple.  Cardiovascular: Normal rate, regular rhythm and normal heart sounds.  Pulmonary/Chest: Effort normal and breath sounds normal. No respiratory distress.  Musculoskeletal: Normal range of motion.  Neurological: She is alert and oriented to person, place, and time.  Skin: Skin is warm and dry. No rash noted.  Psychiatric: She has a normal mood and affect. Her speech is normal and behavior is normal. Thought content normal. She expresses no suicidal ideation.  Nursing note and vitals reviewed.   BP 138/84   Pulse 98   Ht 5\' 8"  (1.727  m)   Wt 188 lb (85.3 kg)   SpO2 94%   BMI 28.59 kg/m   Assessment and Plan: 1. Moderate episode of recurrent major depressive disorder (HCC) Much improved Continue Cymbalta at current dose 60 mg  2. Inflammatory spondylopathy of lumbar region [facility]) Followed by Dr. Andree Elk No change in chronic pain sx at this time

## 2017-10-06 ENCOUNTER — Ambulatory Visit: Payer: Medicare Other | Admitting: Anesthesiology

## 2017-10-07 ENCOUNTER — Ambulatory Visit
Admission: RE | Admit: 2017-10-07 | Discharge: 2017-10-07 | Disposition: A | Discharge status: Home / Self Care | Payer: Medicare Other | Source: Ambulatory Visit | Attending: Surgery | Admitting: Surgery

## 2017-10-07 DIAGNOSIS — M7582 Other shoulder lesions, left shoulder: Secondary | ICD-10-CM | POA: Insufficient documentation

## 2017-10-07 DIAGNOSIS — M25512 Pain in left shoulder: Secondary | ICD-10-CM | POA: Insufficient documentation

## 2017-10-07 DIAGNOSIS — M19012 Primary osteoarthritis, left shoulder: Secondary | ICD-10-CM | POA: Diagnosis not present

## 2017-10-09 IMAGING — MG MM DIGITAL SCREENING BILAT W/ CAD
4 series · 4 of 4 positions shown · non-contrast
Comparison: Previous exam(s).

CLINICAL DATA: Screening.

EXAM:
DIGITAL SCREENING BILATERAL MAMMOGRAM WITH CAD

[R CC]
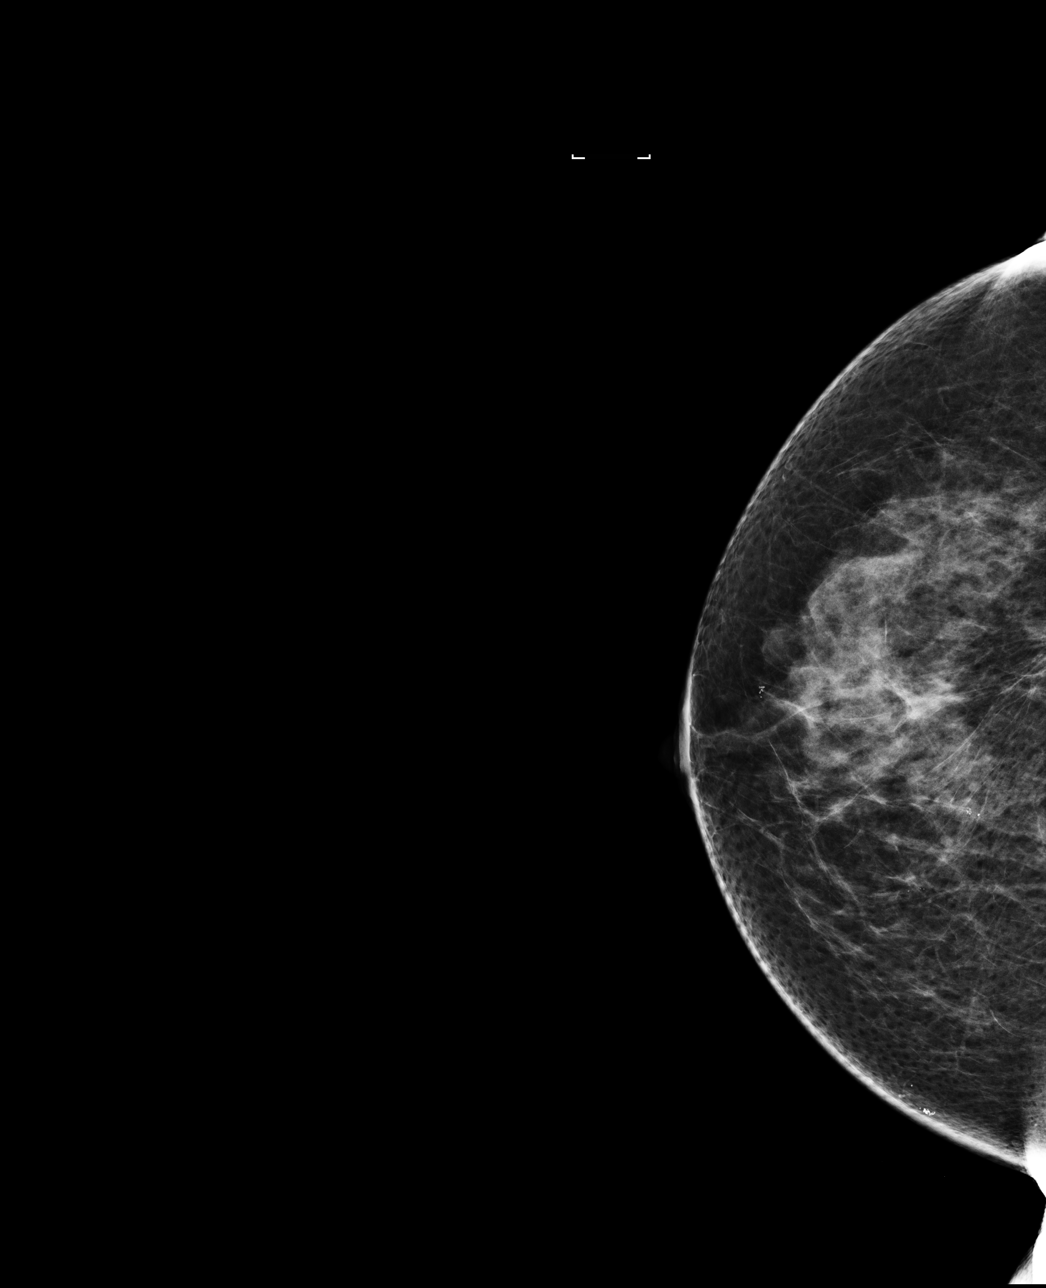

[L CC]
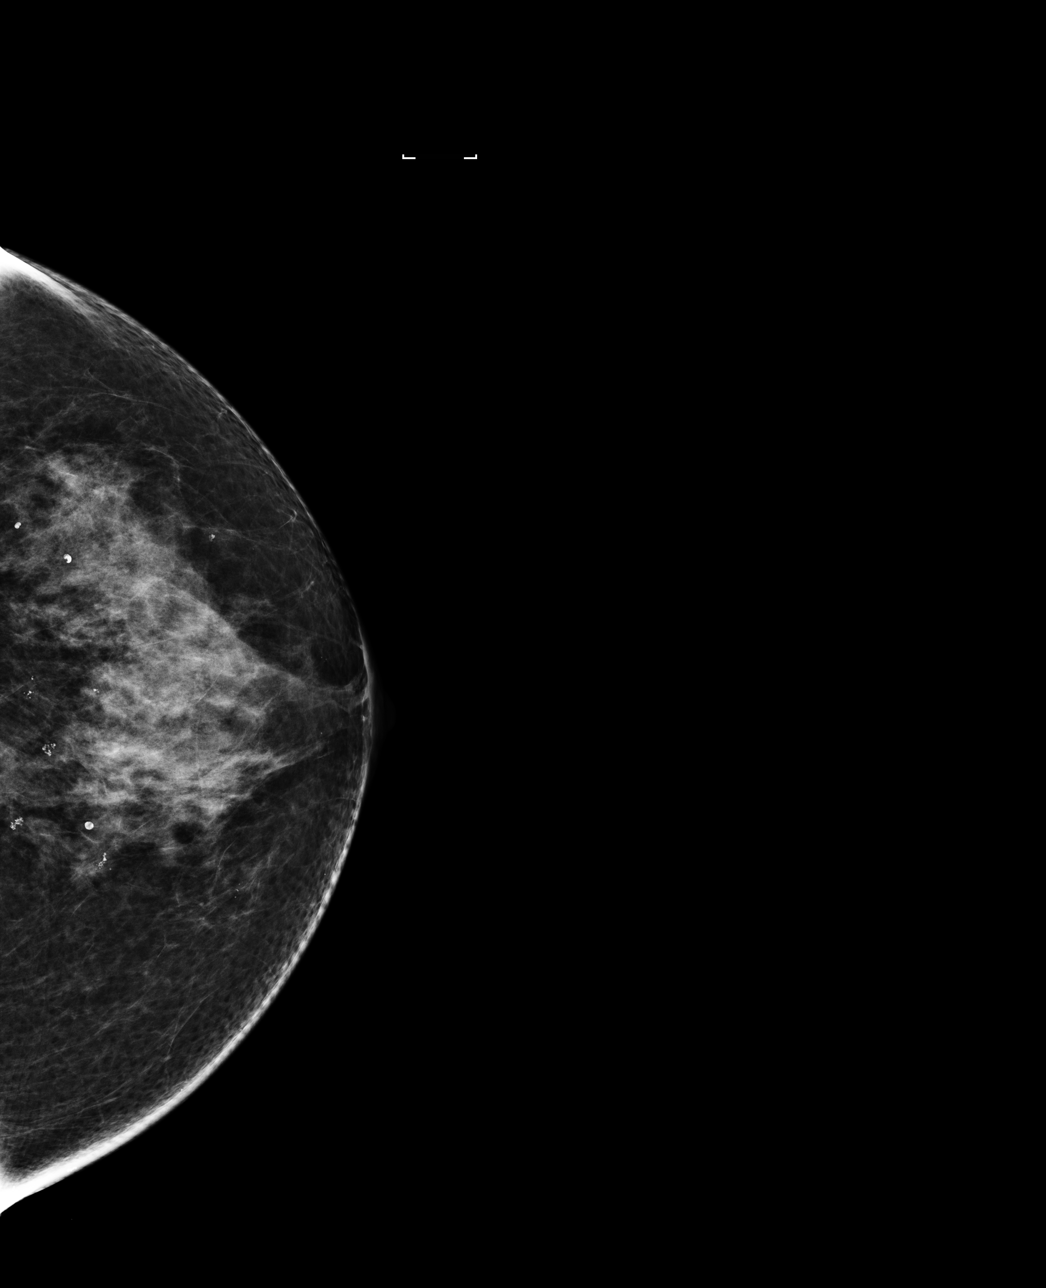

[L MLO]
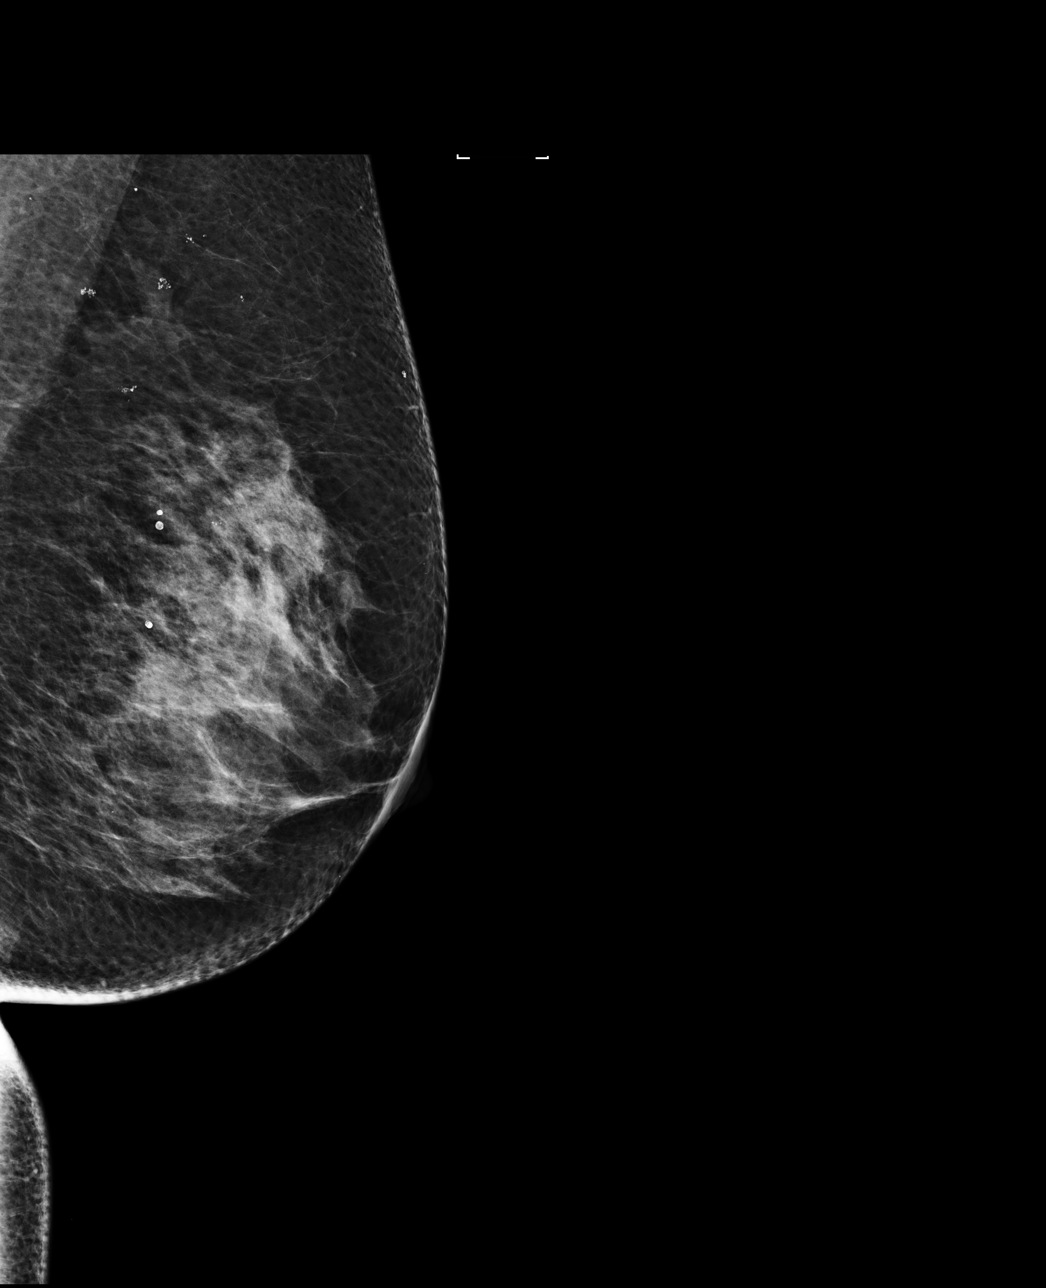

[R MLO]
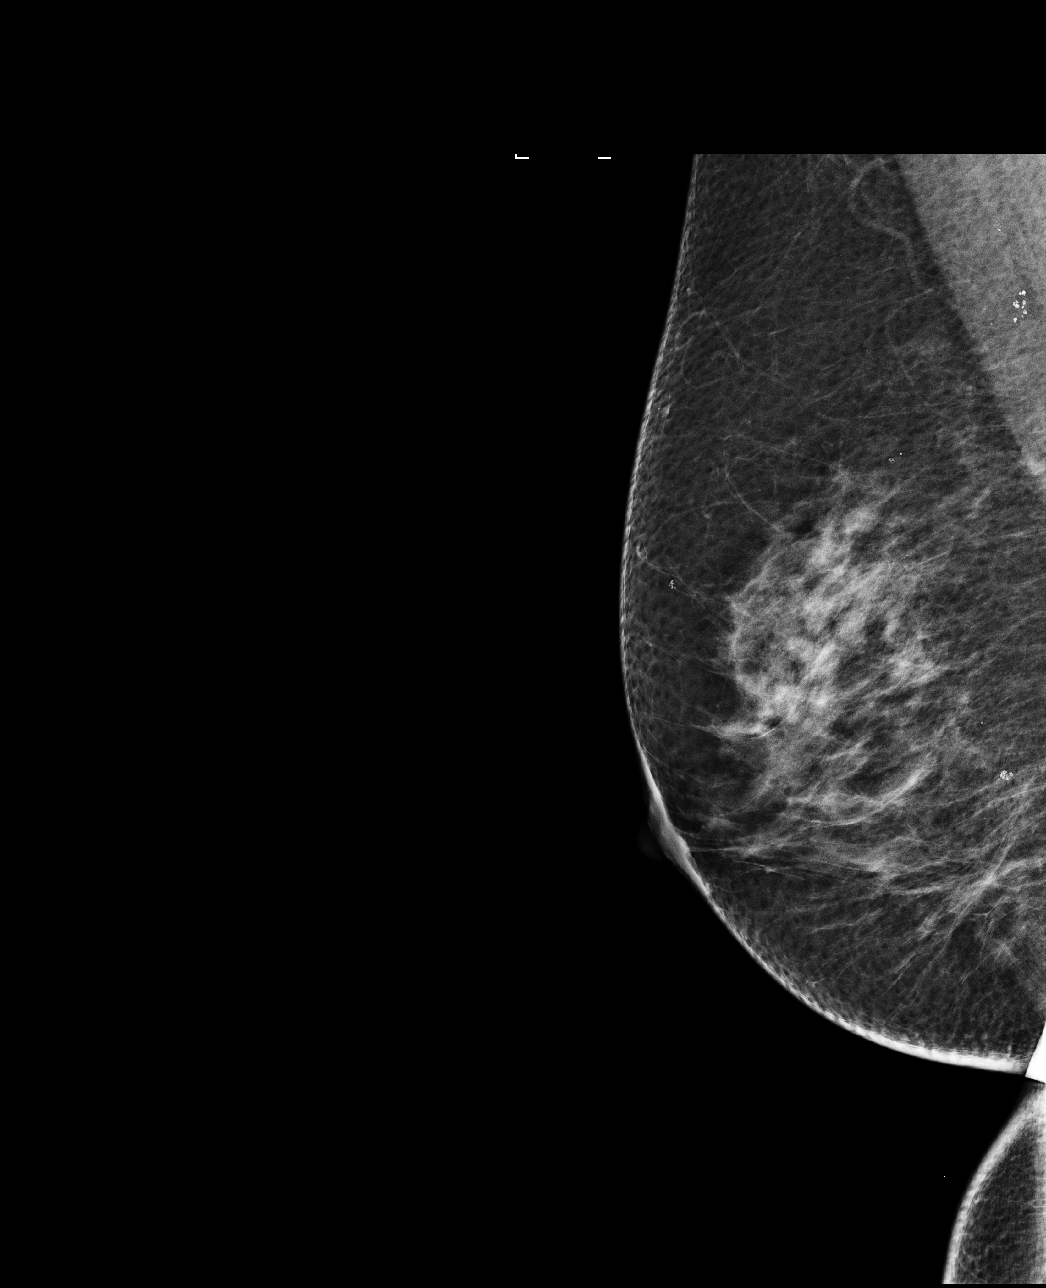

[4 of 4 positions shown; findings below may reference images not displayed]

ACR Breast Density Category d: The breast tissue is extremely dense,
which lowers the sensitivity of mammography.
FINDINGS: There are no findings suspicious for malignancy. Images were
processed with CAD.
IMPRESSION: No mammographic evidence of malignancy. A result letter of this
screening mammogram will be mailed directly to the patient.

RECOMMENDATION:
Screening mammogram in one year. (Code:[id number]-[id number])

BI-RADS CATEGORY  1: Negative.

## 2017-10-19 DIAGNOSIS — L03211 Cellulitis of face: Secondary | ICD-10-CM | POA: Diagnosis not present

## 2017-10-24 DIAGNOSIS — L03211 Cellulitis of face: Secondary | ICD-10-CM | POA: Diagnosis not present

## 2017-11-08 ENCOUNTER — Other Ambulatory Visit: Payer: Self-pay

## 2017-11-08 ENCOUNTER — Ambulatory Visit: Payer: Medicare Other | Attending: Anesthesiology | Admitting: Anesthesiology

## 2017-11-08 ENCOUNTER — Other Ambulatory Visit: Payer: Self-pay | Admitting: Anesthesiology

## 2017-11-08 ENCOUNTER — Encounter: Payer: Self-pay | Admitting: Anesthesiology

## 2017-11-08 ENCOUNTER — Ambulatory Visit: Admission: RE | Admit: 2017-11-08 | Payer: Medicare Other | Source: Ambulatory Visit

## 2017-11-08 VITALS — BP 129/74 | HR 95 | Temp 98.2°F | Resp 16 | Ht 68.0 in | Wt 186.0 lb

## 2017-11-08 DIAGNOSIS — M47812 Spondylosis without myelopathy or radiculopathy, cervical region: Secondary | ICD-10-CM | POA: Diagnosis not present

## 2017-11-08 DIAGNOSIS — M5136 Other intervertebral disc degeneration, lumbar region: Secondary | ICD-10-CM | POA: Diagnosis not present

## 2017-11-08 DIAGNOSIS — M47816 Spondylosis without myelopathy or radiculopathy, lumbar region: Secondary | ICD-10-CM | POA: Diagnosis not present

## 2017-11-08 DIAGNOSIS — M5442 Lumbago with sciatica, left side: Secondary | ICD-10-CM | POA: Diagnosis not present

## 2017-11-08 DIAGNOSIS — G894 Chronic pain syndrome: Secondary | ICD-10-CM | POA: Diagnosis not present

## 2017-11-08 DIAGNOSIS — M51369 Other intervertebral disc degeneration, lumbar region without mention of lumbar back pain or lower extremity pain: Secondary | ICD-10-CM

## 2017-11-08 DIAGNOSIS — M797 Fibromyalgia: Secondary | ICD-10-CM

## 2017-11-08 DIAGNOSIS — G8929 Other chronic pain: Secondary | ICD-10-CM | POA: Diagnosis not present

## 2017-11-08 DIAGNOSIS — R52 Pain, unspecified: Secondary | ICD-10-CM

## 2017-11-08 DIAGNOSIS — M5441 Lumbago with sciatica, right side: Secondary | ICD-10-CM | POA: Diagnosis not present

## 2017-11-08 MED ORDER — TRAMADOL HCL 50 MG PO TABS: 50.0000 mg | ORAL_TABLET | Freq: Four times a day (QID) | ORAL | 3 refills | Status: DC | PRN

## 2017-11-08 NOTE — Progress Notes (Signed)
Nursing Pain Medication Assessment:  Safety precautions to be maintained throughout the outpatient stay will include: orient to surroundings, keep bed in low position, maintain call bell within reach at all times, provide assistance with transfer out of bed and ambulation.  Medication Inspection Compliance: Pill count conducted under aseptic conditions, in front of the patient. Neither the pills nor the bottle was removed from the patient's sight at any time. Once count was completed pills were immediately returned to the patient in their original bottle.  Medication #1: Tramadol (Ultram) Pill/Patch Count: 18 of 90 pills remain Pill/Patch Appearance: Markings consistent with prescribed medication Bottle Appearance: Standard pharmacy container. Clearly labeled. Filled Date: 8 / 6 / 2019 Last Medication intake:  Today  Medication #2: Hydrocodone/APAP Pill/Patch Count: 19 of 30 pills remain Pill/Patch Appearance: Markings consistent with prescribed medication Bottle Appearance: Standard pharmacy container. Clearly labeled. Filled Date: 8 / 6 / 2019 Last Medication intake:  Today

## 2017-11-10 ENCOUNTER — Ambulatory Visit: Payer: Medicare Other | Admitting: Anesthesiology

## 2017-11-23 ENCOUNTER — Other Ambulatory Visit: Payer: Self-pay | Admitting: *Deleted

## 2017-11-23 ENCOUNTER — Telehealth: Payer: Self-pay | Admitting: *Deleted

## 2017-11-23 DIAGNOSIS — M5441 Lumbago with sciatica, right side: Principal | ICD-10-CM

## 2017-11-23 DIAGNOSIS — G8929 Other chronic pain: Secondary | ICD-10-CM

## 2017-11-23 DIAGNOSIS — M5442 Lumbago with sciatica, left side: Principal | ICD-10-CM

## 2017-11-23 NOTE — Telephone Encounter (Signed)
Checked with Dr. Andree Elk. Ok to call in refill and to order PT. Both completed and patient called.

## 2017-11-23 NOTE — Telephone Encounter (Signed)
Order for referral sent and refill of Valium called to [facility] per patient request.

## 2017-11-29 ENCOUNTER — Ambulatory Visit: Payer: Medicare Other | Admitting: Physical Therapy

## 2017-12-01 ENCOUNTER — Other Ambulatory Visit: Payer: Self-pay | Admitting: Internal Medicine

## 2017-12-01 DIAGNOSIS — M797 Fibromyalgia: Secondary | ICD-10-CM

## 2017-12-01 DIAGNOSIS — M5090 Cervical disc disorder, unspecified, unspecified cervical region: Secondary | ICD-10-CM

## 2017-12-05 ENCOUNTER — Encounter: Payer: Self-pay | Admitting: Internal Medicine

## 2017-12-05 ENCOUNTER — Ambulatory Visit (INDEPENDENT_AMBULATORY_CARE_PROVIDER_SITE_OTHER): Payer: Medicare Other | Admitting: Internal Medicine

## 2017-12-05 VITALS — BP 128/80 | HR 90 | Ht 68.0 in | Wt 185.0 lb

## 2017-12-05 DIAGNOSIS — F331 Major depressive disorder, recurrent, moderate: Secondary | ICD-10-CM

## 2017-12-05 DIAGNOSIS — Z23 Encounter for immunization: Secondary | ICD-10-CM | POA: Diagnosis not present

## 2017-12-05 DIAGNOSIS — I10 Essential (primary) hypertension: Secondary | ICD-10-CM | POA: Diagnosis not present

## 2017-12-05 NOTE — Progress Notes (Signed)
Date:  12/05/2017   Name:  Carrie Myers   DOB:  1945-04-15   MRN:  [id number]   Chief Complaint: Depression (Follow up. ) and Immunizations (high dose flu. )  Depression         This is a chronic problem.  Progression since onset: improved and feels that she is doing well.  Associated symptoms include no fatigue.  Past treatments include SSRIs - Selective serotonin reuptake inhibitors.  Compliance with treatment is good.  Previous treatment provided significant relief.  Past medical history includes chronic pain.   Hypertension  This is a chronic problem. The problem is controlled. Pertinent negatives include no chest pain, palpitations or shortness of breath. Past treatments include angiotensin blockers.    Review of Systems  Constitutional: Negative for chills, fatigue and fever.  Respiratory: Negative for chest tightness, shortness of breath and wheezing.   Cardiovascular: Negative for chest pain and palpitations.  Gastrointestinal: Negative for abdominal pain, constipation and diarrhea.  Genitourinary: Negative for dysuria.  Psychiatric/Behavioral: Positive for depression and sleep disturbance. Negative for dysphoric mood. The patient is not nervous/anxious.     Patient Active Problem List   Diagnosis Date Noted  . Moderate episode of recurrent major depressive disorder ([place]) 10/03/2017  . Inflammatory spondylopathy of lumbar region ([place]) 10/03/2017  . Hoarseness, persistent 04/18/2017  . OSA on CPAP 02/18/2017  . Cervical disc disease 12/31/2016  . Generalized osteoarthritis 12/23/2016  . Chronic pain syndrome 12/23/2016  . Cystocele, midline 12/06/2016  . Urinary incontinence 12/06/2016  . Impingement syndrome of right shoulder 04/17/2015  . Fibrocystic breast 01/18/2015  . Gastroesophageal reflux disease without esophagitis 01/18/2015  . Hypothyroidism, postablative 01/18/2015  . Arteriosclerosis of coronary artery 12/29/2014  . DJD of shoulder 11/13/2014  .  Fibromyalgia 10/02/2014  . Intercostal neuralgia 10/02/2014  . Facet syndrome, lumbar 10/02/2014  . Sacroiliac joint disease 10/02/2014  . Carotid artery narrowing 07/09/2014  . Benign essential HTN 07/01/2014  . Osteoarthritis of thumb 09/24/2012  . Mixed hyperlipidemia 09/22/2012  . Arthritis of hand, degenerative 09/22/2012    Allergies  Allergen Reactions  . Augmentin [Amoxicillin-Pot Clavulanate] Nausea And Vomiting  . Codeine Nausea And Vomiting  . Sulfa Antibiotics Nausea And Vomiting  . Proparacaine Itching  . Shellfish Allergy Nausea And Vomiting, Nausea Only and Other (See Comments)    No issues with iodine     Past Surgical History:  Procedure Laterality Date  . ABDOMINAL HYSTERECTOMY     total  . APPENDECTOMY    . BREAST BIOPSY Right    neg  . BREAST CYST ASPIRATION Left    neg  . BUNIONECTOMY Bilateral   . CESAREAN SECTION    . COLONOSCOPY  2010   normal  . CORONARY ANGIOPLASTY WITH STENT PLACEMENT  04/2006  . dental implant     [age]  . epidural steroid injection  2018  . SHOULDER ARTHROSCOPY Right 05/07/2015   Procedure: Extensive arthroscopic debridement and arthroscopic subacromial decompression, right shoulder.  2. Steroid injection right thumb CMC joint.;  Surgeon: Corky Mull, MD;  Location: [facility];  Service: Orthopedics;  Laterality: Right;  . TOOTH EXTRACTION     Upper centrals and lateral    Social History   Tobacco Use  . Smoking status: Former Smoker    Types: Cigarettes  . Smokeless tobacco: Former Systems developer    Quit date: 04/22/2006  . Tobacco comment: quit 8 years  Substance Use Topics  . Alcohol use: No  Alcohol/week: 0.0 standard drinks  . Drug use: No     Medication list has been reviewed and updated.  Current Meds  Medication Sig  . aspirin EC 81 MG tablet Take 81 mg by mouth daily.  . cyclobenzaprine (FLEXERIL) 10 MG tablet TAKE 1 TABLET BY MOUTH 3  TIMES DAILY  . diazepam (VALIUM) 5 MG  tablet Limit  1/2  tablet by mouth per day or every other day if tolerated  . Diclofenac-miSOPROStol 75-0.2 MG TBEC TAKE 1 TABLET BY MOUTH TWO  TIMES DAILY  . DULoxetine (CYMBALTA) 60 MG capsule Take 1 capsule (60 mg total) by mouth daily.  Marland Kitchen esomeprazole (NEXIUM) 20 MG capsule Take 40 mg by mouth daily. Over the Counter  . HYDROcodone-acetaminophen (NORCO/VICODIN) 5-325 MG tablet Take 1 tablet by mouth at bedtime. Limit 1-2 tablets by mouth per day if tolerated  . levothyroxine (SYNTHROID, LEVOTHROID) 88 MCG tablet TAKE 1 TABLET BY MOUTH  DAILY  . losartan (COZAAR) 50 MG tablet Take 50 mg by mouth daily.  . Multiple Vitamin (MULTI-VITAMINS) TABS Take by mouth.  . NON FORMULARY   . pravastatin (PRAVACHOL) 80 MG tablet Take 80 mg by mouth daily.  Marland Kitchen tolterodine (DETROL LA) 4 MG 24 hr capsule Take 1 capsule (4 mg total) by mouth daily.  . traMADol (ULTRAM) 50 MG tablet Take 1 tablet (50 mg total) by mouth every 6 (six) hours as needed.    PHQ 2/9 Scores 12/05/2017 11/08/2017 10/03/2017 09/28/2017  PHQ - 2 Score 0 0 0 0  PHQ- 9 Score - - 1 -  Exception Documentation - - - -    Physical Exam  Constitutional: She is oriented to person, place, and time. She appears well-developed. No distress.  HENT:  Head: Normocephalic and atraumatic.  Neck: Normal range of motion. Neck supple. No thyroid mass and no thyromegaly present.  Cardiovascular: Normal rate, regular rhythm and normal heart sounds.  Pulmonary/Chest: Effort normal and breath sounds normal. No respiratory distress.  Musculoskeletal: She exhibits no edema.  Neurological: She is alert and oriented to person, place, and time.  Skin: Skin is warm and dry. No rash noted.  Psychiatric: She has a normal mood and affect. Her speech is normal and behavior is normal. Judgment and thought content normal. She expresses no suicidal plans.  Nursing note and vitals reviewed.   BP 128/80 (BP Location: Right Arm, Patient Position: Sitting, Cuff Size:  Normal)   Pulse 90   Ht 5\' 8"  (1.727 m)   Wt 185 lb (83.9 kg)   SpO2 93%   BMI 28.13 kg/m   Assessment and Plan: 1. Benign essential HTN controlled  2. Moderate episode of recurrent major depressive disorder (HCC) Doing well on Cymbalta  3. Need for influenza vaccination - Flu vaccine HIGH DOSE PF (Fluzone High dose)   Partially dictated using Editor, commissioning. Any errors are unintentional.  Halina Maidens, MD [facility]  12/05/2017

## 2017-12-06 ENCOUNTER — Encounter: Payer: Self-pay | Admitting: Physical Therapy

## 2017-12-06 ENCOUNTER — Other Ambulatory Visit: Payer: Self-pay

## 2017-12-06 ENCOUNTER — Ambulatory Visit: Payer: Medicare Other | Attending: Anesthesiology

## 2017-12-06 DIAGNOSIS — R2689 Other abnormalities of gait and mobility: Secondary | ICD-10-CM | POA: Diagnosis not present

## 2017-12-06 DIAGNOSIS — M5441 Lumbago with sciatica, right side: Secondary | ICD-10-CM | POA: Insufficient documentation

## 2017-12-06 DIAGNOSIS — M6281 Muscle weakness (generalized): Secondary | ICD-10-CM | POA: Diagnosis not present

## 2017-12-06 DIAGNOSIS — G8929 Other chronic pain: Secondary | ICD-10-CM | POA: Insufficient documentation

## 2017-12-06 NOTE — Therapy (Signed)
[facility] [address] [place], [place], [address] Phone: [phone]   Fax:  [phone]  Physical Therapy Evaluation  Patient Details  Name: Carrie Myers MRN: [id number] Date of Birth: 1945-12-09 Referring Provider: Dr. Andree Elk   Encounter Date: 12/06/2017  PT End of Session - 12/06/17 1922    Visit Number  1    Number of Visits  9    Date for PT Re-Evaluation  01/03/18    PT Start Time  7616    PT Stop Time  1426    PT Time Calculation (min)  64 min    Activity Tolerance  Patient limited by pain;Patient tolerated treatment well    Behavior During Therapy  [facility] for tasks assessed/performed       Past Medical History:  Diagnosis Date  . Allergy   . Anxiety   . Arthritis    neck, hands, lower back  . Bilateral dry eyes   . Chronic low back pain 12/23/2016  . DDD (degenerative disc disease), lumbar 10/02/2014  . Dental crowns present    Implants  . Fibromyalgia   . Fracture of right foot 01/14/2015  . GERD (gastroesophageal reflux disease)   . Hyperlipidemia   . Hypertension   . Kidney stones   . Myocardial infarction ([place]) 04/22/2006  . Osteoarthritis   . Other shoulder lesions, right shoulder 04/17/2015  . Rheumatoid arthritis ([place])   . Status post shoulder surgery 05/27/2015  . Thyroid disease     Past Surgical History:  Procedure Laterality Date  . ABDOMINAL HYSTERECTOMY     total  . APPENDECTOMY    . BREAST BIOPSY Right    neg  . BREAST CYST ASPIRATION Left    neg  . BUNIONECTOMY Bilateral   . CESAREAN SECTION    . COLONOSCOPY  2010   normal  . CORONARY ANGIOPLASTY WITH STENT PLACEMENT  04/2006  . dental implant     [age]  . epidural steroid injection  2018  . SHOULDER ARTHROSCOPY Right 05/07/2015   Procedure: Extensive arthroscopic debridement and arthroscopic subacromial decompression, right shoulder.  2. Steroid injection right thumb CMC joint.;  Surgeon: Corky Mull, MD;   Location: [place];  Service: Orthopedics;  Laterality: Right;  . TOOTH EXTRACTION     Upper centrals and lateral    There were no vitals filed for this visit.   Subjective Assessment - 12/06/17 1918    Subjective  Pt. is 72 y.o. female referred to therapy for LBP with radicular sx in R LE.  Pt. is currently retired and has not been as active as she has in the past.  Pt. states she has recently signed up for a gym membership, however has not been able to attend yet.  Pt. would like to benefit from PT services and education in order to become stronger and have decreased pain in order to be successful in the gym-setting.      How long can you stand comfortably?  <10 min    How long can you walk comfortably?  5 min    Patient Stated Goals  Be able to wash dishes/ cook without increase in pain, become more active in gym.    Currently in Pain?  Yes    Pain Score  5     Pain Location  Back    Pain Orientation  Lower    Pain Descriptors / Indicators  Constant;Sharp;Stabbing;Pressure    Pain Type  Chronic pain  Pain Radiating Towards  R LE    Pain Onset  More than a month ago    Pain Frequency  Constant    Aggravating Factors   Slightly flexed position    Pain Relieving Factors  Getting out of position, pain medication                  SUBJECTIVE   Chief complaint:  LBP with Radicular Sx down R LE Onset: 03/15/2017 Referring Dx:  MD: Pain: 5/10 Present, 4/10 Best, 8/10 Worst: Aggravating factors: Forward flexion while trimming bushes/ washing dishes Easing factors: Pain medicine, staying out of flexed position 24 hour pain behavior: Better in morning, worse at night. How long can you sit: n/a How long can you stand: <10 min How long can you walk: 5 min Recent back trauma: No Prior history of back injury or pain: No Pain quality: Constant, Stabbing, Pressure, Sharp Radiating pain: R LE Numbness/Tingling: No Follow-up appointment with MD:  10/20/17     OBJECTIVE   MUSCULOSKELETAL: Tremor: None Bulk: Normal Tone: Normal  Posture No abnormalities noted at this time.  Gait Slight forward flexion when ambulating.  Palpation Tenderness in R buttock Sacral thrust painful   Strength (out of 5) R/L 4*/4-* Hip flexion 5/5 Hip abduction 5/5 Hip adduction 5/5 Hip extension 5/4- Knee extension 5/4 Knee flexion 5/5 Ankle dorsiflexion *Indicates pain   Repeated Movements Peripheralization of symptoms with repeated lumbar repeated extension x5.    Muscle Length Hamstrings: R: 74 degrees L: 52 degrees  Ely: R: Positive L: Positive Ober: R: Positive L: Positive    SPECIAL TESTS Lumbar quadrant: R: Not examined L: Not examined Slump: R: Not examined L: Not examined SLR: R: Negative L:  Negative Crossed SLR: R: Negative L: Negative FABER: R: Negative L: Negative FADIR: R: Negative L: Negative  Hip scour: R: Negative L: Negative Ely: R: Positive L: Positive Thomas: R: Not examined L: Not examined Ober: R: Positive L: Positive Forward Step-Down Test: R: Not examined L: Not examined Lateral Step-Down Test: R: Not examined L: Not examined Deep Squat: Not examined    ASSESSMENT Pt is a pleasant 72 year-old female referred for low back pain and radicular sx down R LE. PT examination reveals deficits in ROM, strength of B hip, L knee, and decreased mobility.  Pt. is very pain-focused and states that she is always in pain due to fibromyalgia. Pt presents with deficits in strength, mobility, range of motion, and pain. Pt will benefit from skilled PT services to address deficits and return to pain-free function at home, specifically with washing dishes and cooking.                 Objective measurements completed on examination: See above findings.              PT Education - 12/07/17 1323    Education Details  HEP, plan of care    Person(s) Educated  Patient    Methods   Explanation;Handout    Comprehension  Verbalized understanding       PT Short Term Goals - 12/06/17 1928      PT SHORT TERM GOAL #1   Title  Pt will be independent with HEP in order to improve strength and decrease back pain in order to improve pain-free function at home and work.     Baseline  Given HEP at initial eval.    Time  1    Period  Weeks    Status  New    Target Date  12/13/17        PT Long Term Goals - 12/06/17 1926      PT LONG TERM GOAL #1   Title  Pt. will complete FOTO and improve to 55 to improve daily functional mobility.    Baseline  9/24 FOTO: 40    Time  4    Period  Weeks    Status  New    Target Date  01/03/18      PT LONG TERM GOAL #2   Title  Pt will decrease worst back pain as reported on NPRS by at least 2 points in order to demonstrate clinically significant reduction in back pain.     Baseline  9/24 Worst Pain: 8/10    Time  4    Period  Weeks    Status  New    Target Date  01/03/18      PT LONG TERM GOAL #3   Title  Pt will increase strength of B hip by at least 1/2 MMT grade in order to demonstrate improvement in strength and function.    Baseline  9/24 Hip Flexion R/L: 4/4-    Time  4    Period  Weeks    Status  New    Target Date  01/03/18             Plan - 12/06/17 1923    Clinical Impression Statement  Pt is a pleasant 72 year-old female referred for low back pain and radicular sx down R LE. PT examination reveals deficits in ROM, strength of B hip, L knee, and decreased mobility.  Pt. is very pain-focused and states that she is always in pain due to fibromyalgia. Pt presents with deficits in strength, mobility, range of motion, and pain. Pt will benefit from skilled PT services to address deficits and return to pain-free function at home, specifically with washing dishes and cooking.    History and Personal Factors relevant to plan of care:  (+) wants to go to gym, (-) very pain focused    Clinical Presentation  Evolving     Clinical Decision Making  Moderate    Rehab Potential  Fair    PT Frequency  2x / week    PT Duration  4 weeks    PT Treatment/Interventions  Aquatic Therapy;Cryotherapy;Electrical Stimulation;Moist Heat;Traction;Gait Scientist, forensic;Therapeutic activities;Therapeutic exercise;Manual techniques;Patient/family education;Neuromuscular re-education;Balance training;Passive range of motion;Dry needling;Splinting;Taping;Spinal Manipulations;Joint Manipulations    PT Next Visit Plan  Manual Therapy/ Stretches to piriformis    PT Home Exercise Plan  see handouts    Consulted and Agree with Plan of Care  Patient       Patient will benefit from skilled therapeutic intervention in order to improve the following deficits and impairments:  Decreased endurance, Hypomobility, Decreased activity tolerance, Decreased strength, Pain, Difficulty walking, Decreased mobility, Decreased range of motion, Impaired flexibility  Visit Diagnosis: Chronic bilateral low back pain with right-sided sciatica  Muscle weakness (generalized)  Other abnormalities of gait and mobility     Problem List Patient Active Problem List   Diagnosis Date Noted  . Moderate episode of recurrent major depressive disorder ([place]) 10/03/2017  . Inflammatory spondylopathy of lumbar region ([place]) 10/03/2017  . Hoarseness, persistent 04/18/2017  . OSA on CPAP 02/18/2017  . Cervical disc disease 12/31/2016  . Generalized osteoarthritis 12/23/2016  . Chronic pain syndrome 12/23/2016  . Cystocele, midline 12/06/2016  . Urinary incontinence 12/06/2016  . Impingement syndrome of right shoulder  04/17/2015  . Fibrocystic breast 01/18/2015  . Gastroesophageal reflux disease without esophagitis 01/18/2015  . Hypothyroidism, postablative 01/18/2015  . Arteriosclerosis of coronary artery 12/29/2014  . DJD of shoulder 11/13/2014  . Fibromyalgia 10/02/2014  . Intercostal neuralgia 10/02/2014  . Facet syndrome, lumbar 10/02/2014  .  Sacroiliac joint disease 10/02/2014  . Carotid artery narrowing 07/09/2014  . Benign essential HTN 07/01/2014  . Osteoarthritis of thumb 09/24/2012  . Mixed hyperlipidemia 09/22/2012  . Arthritis of hand, degenerative 09/22/2012    This entire session was performed under direct supervision and direction of a licensed therapist/therapist assistant . I have personally read, edited and approve of the note as written.   Gwenlyn Saran SPT Phillips Grout PT, DPT, GCS  Huprich,Jason 12/07/2017, 1:26 PM  [facility] [address] [place], [place], [address] Phone: [phone]   Fax:  [phone]  Name: Carrie Myers MRN: [id number] Date of Birth: 1945/11/02

## 2017-12-06 NOTE — Patient Instructions (Signed)
Access Code: [email or web address]  URL: [email or web address]  Date: 12/06/2017  Prepared by: Roxana Hires   Exercises  Supine Figure 4 Piriformis Stretch - 10 reps - 3 sets - 30 hold - 1x daily - 7x weekly  Supine Piriformis Stretch with Foot on Ground - 10 reps - 3 sets - 30 hold - 1x daily - 7x weekly  Seated Piriformis Stretch - 10 reps - 3 sets - 30 hold - 1x daily - 7x weekly  Supine Hamstring Stretch - 10 reps - 3 sets - 30 hold - 1x daily - 7x weekly  Sidelying Quadriceps Stretch - 10 reps - 3 sets - 30 hold - 1x daily - 7x weekly

## 2017-12-11 ENCOUNTER — Other Ambulatory Visit: Payer: Self-pay

## 2017-12-11 ENCOUNTER — Emergency Department: Payer: Medicare Other

## 2017-12-11 ENCOUNTER — Emergency Department
Admission: EM | Admit: 2017-12-11 | Discharge: 2017-12-11 | Disposition: A | Discharge status: Home / Self Care | Payer: Medicare Other | Attending: Emergency Medicine | Admitting: Emergency Medicine

## 2017-12-11 DIAGNOSIS — Z87891 Personal history of nicotine dependence: Secondary | ICD-10-CM | POA: Insufficient documentation

## 2017-12-11 DIAGNOSIS — I2510 Atherosclerotic heart disease of native coronary artery without angina pectoris: Secondary | ICD-10-CM | POA: Insufficient documentation

## 2017-12-11 DIAGNOSIS — Z7982 Long term (current) use of aspirin: Secondary | ICD-10-CM | POA: Diagnosis not present

## 2017-12-11 DIAGNOSIS — E785 Hyperlipidemia, unspecified: Secondary | ICD-10-CM | POA: Insufficient documentation

## 2017-12-11 DIAGNOSIS — Z79899 Other long term (current) drug therapy: Secondary | ICD-10-CM | POA: Insufficient documentation

## 2017-12-11 DIAGNOSIS — I10 Essential (primary) hypertension: Secondary | ICD-10-CM | POA: Insufficient documentation

## 2017-12-11 DIAGNOSIS — E039 Hypothyroidism, unspecified: Secondary | ICD-10-CM | POA: Diagnosis not present

## 2017-12-11 DIAGNOSIS — I252 Old myocardial infarction: Secondary | ICD-10-CM | POA: Insufficient documentation

## 2017-12-11 DIAGNOSIS — R1111 Vomiting without nausea: Secondary | ICD-10-CM | POA: Diagnosis not present

## 2017-12-11 DIAGNOSIS — R112 Nausea with vomiting, unspecified: Secondary | ICD-10-CM | POA: Insufficient documentation

## 2017-12-11 LAB — CBC WITH DIFFERENTIAL/PLATELET
BASOS ABS: 0.1 10*3/uL (ref 0–0.1)
BASOS PCT: 1 %
EOS ABS: 0.2 10*3/uL (ref 0–0.7)
Eosinophils Relative: 1 %
HCT: 41.1 % (ref 35.0–47.0)
HEMOGLOBIN: 14.2 g/dL (ref 12.0–16.0)
Lymphocytes Relative: 8 %
Lymphs Abs: 1.1 10*3/uL (ref 1.0–3.6)
MCH: 30.6 pg (ref 26.0–34.0)
MCHC: 34.5 g/dL (ref 32.0–36.0)
MCV: 88.6 fL (ref 80.0–100.0)
Monocytes Absolute: 0.7 10*3/uL (ref 0.2–0.9)
Monocytes Relative: 5 %
NEUTROS PCT: 85 %
Neutro Abs: 12.1 10*3/uL — ABNORMAL HIGH (ref 1.4–6.5)
Platelets: 292 10*3/uL (ref 150–440)
RBC: 4.63 MIL/uL (ref 3.80–5.20)
RDW: 14 % (ref 11.5–14.5)
WBC: 14 10*3/uL — AB (ref 3.6–11.0)

## 2017-12-11 LAB — URINALYSIS, COMPLETE (UACMP) WITH MICROSCOPIC
Bacteria, UA: NONE SEEN
Bilirubin Urine: NEGATIVE
Glucose, UA: NEGATIVE mg/dL
Hgb urine dipstick: NEGATIVE
KETONES UR: 5 mg/dL — AB
Nitrite: NEGATIVE
Protein, ur: NEGATIVE mg/dL
Specific Gravity, Urine: 1.018 (ref 1.005–1.030)
pH: 5 (ref 5.0–8.0)

## 2017-12-11 LAB — COMPREHENSIVE METABOLIC PANEL
ALBUMIN: 4.6 g/dL (ref 3.5–5.0)
ALT: 30 U/L (ref 0–44)
AST: 35 U/L (ref 15–41)
Alkaline Phosphatase: 81 U/L (ref 38–126)
Anion gap: 14 (ref 5–15)
BILIRUBIN TOTAL: 1.2 mg/dL (ref 0.3–1.2)
BUN: 23 mg/dL (ref 8–23)
CO2: 24 mmol/L (ref 22–32)
CREATININE: 0.91 mg/dL (ref 0.44–1.00)
Calcium: 9.6 mg/dL (ref 8.9–10.3)
Chloride: 101 mmol/L (ref 98–111)
GFR calc Af Amer: >60 mL/min (ref >60)
Glucose, Bld: 121 mg/dL — ABNORMAL HIGH (ref 70–99)
Potassium: 4.3 mmol/L (ref 3.5–5.1)
Sodium: 139 mmol/L (ref 135–145)
TOTAL PROTEIN: 8 g/dL (ref 6.5–8.1)

## 2017-12-11 LAB — GLUCOSE, CAPILLARY: GLUCOSE-CAPILLARY: 120 mg/dL — AB (ref 70–99)

## 2017-12-11 LAB — LIPASE, BLOOD: LIPASE: 34 U/L (ref 11–51)

## 2017-12-11 LAB — TROPONIN I

## 2017-12-11 MED ORDER — SODIUM CHLORIDE 0.9 % IV BOLUS
1000.0000 mL | Freq: Once | INTRAVENOUS | Status: AC
  Administered 2017-12-11: 1000 mL via INTRAVENOUS

## 2017-12-11 MED ORDER — ONDANSETRON 4 MG PO TBDP: 4.0000 mg | ORAL_TABLET | Freq: Three times a day (TID) | ORAL | 0 refills | Status: DC | PRN

## 2017-12-11 NOTE — ED Provider Notes (Addendum)
[facility] Emergency Department Provider Note   ____________________________________________   First MD Initiated Contact with Patient 12/11/17 1304     (approximate)  I have reviewed the triage vital signs and the nursing notes.   HISTORY  Chief Complaint Nausea and Emesis    HPI Carrie Myers is a 72 y.o. female patient reports an episode of severe nausea and vomiting with multiple episodes of emesis.  She was vomiting up yellow bile she says.  She got some Zofran from EMS and now feels much better.  She denies any chest pain abdominal pain fever further vomiting nausea.  She has not had any diarrhea.  She feels much better at present.   Past Medical History:  Diagnosis Date  . Allergy   . Anxiety   . Arthritis    neck, hands, lower back  . Bilateral dry eyes   . Chronic low back pain 12/23/2016  . DDD (degenerative disc disease), lumbar 10/02/2014  . Dental crowns present    Implants  . Fibromyalgia   . Fracture of right foot 01/14/2015  . GERD (gastroesophageal reflux disease)   . Hyperlipidemia   . Hypertension   . Kidney stones   . Myocardial infarction ([place]) 04/22/2006  . Osteoarthritis   . Other shoulder lesions, right shoulder 04/17/2015  . Rheumatoid arthritis ([place])   . Status post shoulder surgery 05/27/2015  . Thyroid disease     Patient Active Problem List   Diagnosis Date Noted  . Moderate episode of recurrent major depressive disorder ([place]) 10/03/2017  . Inflammatory spondylopathy of lumbar region ([place]) 10/03/2017  . Hoarseness, persistent 04/18/2017  . OSA on CPAP 02/18/2017  . Cervical disc disease 12/31/2016  . Generalized osteoarthritis 12/23/2016  . Chronic pain syndrome 12/23/2016  . Cystocele, midline 12/06/2016  . Urinary incontinence 12/06/2016  . Impingement syndrome of right shoulder 04/17/2015  . Fibrocystic breast 01/18/2015  . Gastroesophageal reflux disease without esophagitis 01/18/2015  .  Hypothyroidism, postablative 01/18/2015  . Arteriosclerosis of coronary artery 12/29/2014  . DJD of shoulder 11/13/2014  . Fibromyalgia 10/02/2014  . Intercostal neuralgia 10/02/2014  . Facet syndrome, lumbar 10/02/2014  . Sacroiliac joint disease 10/02/2014  . Carotid artery narrowing 07/09/2014  . Benign essential HTN 07/01/2014  . Osteoarthritis of thumb 09/24/2012  . Mixed hyperlipidemia 09/22/2012  . Arthritis of hand, degenerative 09/22/2012    Past Surgical History:  Procedure Laterality Date  . ABDOMINAL HYSTERECTOMY     total  . APPENDECTOMY    . BREAST BIOPSY Right    neg  . BREAST CYST ASPIRATION Left    neg  . BUNIONECTOMY Bilateral   . CESAREAN SECTION    . COLONOSCOPY  2010   normal  . CORONARY ANGIOPLASTY WITH STENT PLACEMENT  04/2006  . dental implant     seven  . epidural steroid injection  2018  . SHOULDER ARTHROSCOPY Right 05/07/2015   Procedure: Extensive arthroscopic debridement and arthroscopic subacromial decompression, right shoulder.  2. Steroid injection right thumb CMC joint.;  Surgeon: Corky Mull, MD;  Location: [place];  Service: Orthopedics;  Laterality: Right;  . TOOTH EXTRACTION     Upper centrals and lateral    Prior to Admission medications   Medication Sig Start Date End Date Taking? Authorizing Provider  aspirin EC 81 MG tablet Take 81 mg by mouth daily.    [provider]  cyclobenzaprine (FLEXERIL) 10 MG tablet TAKE 1 TABLET BY MOUTH 3  TIMES DAILY 12/02/17  [name], MD  diazepam (VALIUM) 5 MG tablet Limit  1/2  tablet by mouth per day or every other day if tolerated 09/28/17   Molli Barrows, MD  Diclofenac-miSOPROStol 75-0.2 MG TBEC TAKE 1 TABLET BY MOUTH TWO  TIMES DAILY 12/02/17   [name], MD  DULoxetine (CYMBALTA) 60 MG capsule Take 1 capsule (60 mg total) by mouth daily. 09/19/17   [name], MD  esomeprazole (NEXIUM) 20 MG capsule Take 40 mg by mouth daily. Over  the Counter    [provider]  HYDROcodone-acetaminophen (NORCO/VICODIN) 5-325 MG tablet Take 1 tablet by mouth at bedtime. Limit 1-2 tablets by mouth per day if tolerated 09/28/17 12/05/17  Molli Barrows, MD  levothyroxine (SYNTHROID, LEVOTHROID) 88 MCG tablet TAKE 1 TABLET BY MOUTH  DAILY 02/21/17   [name], MD  losartan (COZAAR) 50 MG tablet Take 50 mg by mouth daily.    [provider]  Multiple Vitamin (MULTI-VITAMINS) TABS Take by mouth.    [provider]  NON FORMULARY     [provider]  ondansetron (ZOFRAN ODT) 4 MG disintegrating tablet Take 1 tablet (4 mg total) by mouth every 8 (eight) hours as needed for nausea or vomiting. 12/11/17   Nena Polio, MD  pravastatin (PRAVACHOL) 80 MG tablet Take 80 mg by mouth daily.    [provider]  tolterodine (DETROL LA) 4 MG 24 hr capsule Take 1 capsule (4 mg total) by mouth daily. 01/05/17   Gae Dry, MD  traMADol (ULTRAM) 50 MG tablet Take 1 tablet (50 mg total) by mouth every 6 (six) hours as needed. 11/08/17   Molli Barrows, MD    Allergies Augmentin [amoxicillin-pot clavulanate]; Codeine; Sulfa antibiotics; Proparacaine; and Shellfish allergy  Family History  Problem Relation Age of Onset  . Stroke Mother   . Heart disease Father   . Pancreatic cancer Sister   . Diabetes Sister   . Prostate cancer Brother   . Breast cancer Neg Hx   . Kidney cancer Neg Hx   . Bladder Cancer Neg Hx     Social History Social History   Tobacco Use  . Smoking status: Former Smoker    Types: Cigarettes  . Smokeless tobacco: Former Systems developer    Quit date: 04/22/2006  . Tobacco comment: quit 8 years  Substance Use Topics  . Alcohol use: No    Alcohol/week: 0.0 standard drinks  . Drug use: No    Review of Systems  Constitutional: No fever/chills Eyes: No visual changes. ENT: No sore throat. Cardiovascular: Denies chest pain. Respiratory: Denies shortness of  breath. Gastrointestinal: Currently no abdominal pain.  No nausea, no vomiting.  No diarrhea.  No constipation. Genitourinary: Negative for dysuria. Musculoskeletal: Negative for back pain. Skin: Negative for rash. Neurological: Negative for headaches, focal weakness   ____________________________________________   PHYSICAL EXAM:  VITAL SIGNS: ED Triage Vitals  Enc Vitals Group     BP 12/11/17 1302 119/71     Pulse Rate 12/11/17 1302 79     Resp 12/11/17 1302 16     Temp 12/11/17 1302 (!) 97.5 F (36.4 C)     Temp Source 12/11/17 1302 Oral     SpO2 12/11/17 1302 93 %     Weight 12/11/17 1303 184 lb 15.5 oz (83.9 kg)     Height 12/11/17 1303 5\' 8"  (1.727 m)     Head Circumference --      Peak Flow --  Pain Score 12/11/17 1303 0     Pain Loc --      Pain Edu? --      Excl. in [place]? --     Constitutional: Alert and oriented. Well appearing and in no acute distress. Eyes: Conjunctivae are normal.  Head: Atraumatic. Nose: No congestion/rhinnorhea. Mouth/Throat: Mucous membranes are moist.  Oropharynx non-erythematous. Neck: No stridor. Cardiovascular: Normal rate, regular rhythm. Grossly normal heart sounds.  Good peripheral circulation. Respiratory: Normal respiratory effort.  No retractions. Lungs CTAB. Gastrointestinal: Soft and nontender. No distention. No abdominal bruits. No CVA tenderness. Musculoskeletal: No lower extremity tenderness nor edema.  Neurologic:  Normal speech and language. No gross focal neurologic deficits are appreciated.  Skin:  Skin is warm, dry and intact. No rash noted. Psychiatric: Mood and affect are normal. Speech and behavior are normal.  ____________________________________________   LABS (all labs ordered are listed, but only abnormal results are displayed)  Labs Reviewed  COMPREHENSIVE METABOLIC PANEL - Abnormal; Notable for the following components:      Result Value   Glucose, Bld 121 (*)    All other components within normal  limits  URINALYSIS, COMPLETE (UACMP) WITH MICROSCOPIC - Abnormal; Notable for the following components:   Color, Urine YELLOW (*)    APPearance HAZY (*)    Ketones, ur 5 (*)    Leukocytes, UA SMALL (*)    All other components within normal limits  CBC WITH DIFFERENTIAL/PLATELET - Abnormal; Notable for the following components:   WBC 14.0 (*)    Neutro Abs 12.1 (*)    All other components within normal limits  GLUCOSE, CAPILLARY - Abnormal; Notable for the following components:   Glucose-Capillary 120 (*)    All other components within normal limits  LIPASE, BLOOD  TROPONIN I  TROPONIN I   ____________________________________________  EKG  EKG read interpreted by me shows normal sinus rhythm no acute ST-T wave changes ____________________________________________  RADIOLOGY  ED MD interpretation: Test x-ray read by radiology reviewed by me shows no acute disease  Official radiology report(s): No results found.  ____________________________________________   PROCEDURES  Procedure(s) performed:   Procedures  Critical Care performed:   ____________________________________________   INITIAL IMPRESSION / ASSESSMENT AND PLAN / ED COURSE  Fluids and antiemetics patient is able to take p.o. and keep it down.  We will let her go have her return if she is worse.  Have her follow-up with her regular doctor.         ____________________________________________   FINAL CLINICAL IMPRESSION(S) / ED DIAGNOSES  Final diagnoses:  Non-intractable vomiting with nausea, unspecified vomiting type     ED Discharge Orders         Ordered    ondansetron (ZOFRAN ODT) 4 MG disintegrating tablet  Every 8 hours PRN     12/11/17 1442           Note:  This document was prepared using Dragon voice recognition software and may include unintentional dictation errors.    Nena Polio, MD 12/16/17 1441    Nena Polio, MD 12/16/17 [phone]

## 2017-12-11 NOTE — ED Notes (Addendum)
Patient states she feels better lying down, Patient given two blankets per request.

## 2017-12-11 NOTE — ED Triage Notes (Signed)
[place] ems [place] roadside, pt's friends were attempting to help her home from church where she became nauseated and vomited approx 6 times there. Pt reports that she feels better at this time, pt was given zofran pta via ems

## 2017-12-11 NOTE — ED Notes (Addendum)
Patient ambulated to room commode with a steady gait. While on the commode, patient c/o increasing weakness and asked to lay down. Patient was assisted to the stretcher. VS were normal except for pulse of 88%. Patient was placed on 2L O2 via [place]. Patient remained alert, denies nausea. Dr. Cinda Quest aware

## 2017-12-11 NOTE — Discharge Instructions (Signed)
Please return here if this happens again.  If you begin to get some nausea I have given you to Zofran orally disintegrating tablets.  They melt in your mouth.  Use 1 every 8 hours.  If that is not enough return or see your doctor.  Otherwise the tests here have been negative.

## 2017-12-12 ENCOUNTER — Ambulatory Visit: Payer: Medicare Other | Admitting: Physical Therapy

## 2017-12-13 ENCOUNTER — Ambulatory Visit: Payer: Medicare Other | Admitting: Physical Therapy

## 2017-12-19 ENCOUNTER — Encounter: Payer: Self-pay | Admitting: Physical Therapy

## 2017-12-19 ENCOUNTER — Ambulatory Visit: Payer: Medicare Other | Attending: Anesthesiology | Admitting: Physical Therapy

## 2017-12-19 DIAGNOSIS — R2689 Other abnormalities of gait and mobility: Secondary | ICD-10-CM | POA: Insufficient documentation

## 2017-12-19 DIAGNOSIS — G8929 Other chronic pain: Secondary | ICD-10-CM | POA: Diagnosis not present

## 2017-12-19 DIAGNOSIS — M6281 Muscle weakness (generalized): Secondary | ICD-10-CM | POA: Diagnosis not present

## 2017-12-19 DIAGNOSIS — R490 Dysphonia: Secondary | ICD-10-CM

## 2017-12-19 DIAGNOSIS — M5441 Lumbago with sciatica, right side: Secondary | ICD-10-CM | POA: Diagnosis not present

## 2017-12-19 NOTE — Patient Instructions (Signed)
Access Code: [email or web address]  URL: [email or web address]  Date: 12/19/2017  Prepared by: Dorcas Carrow   Exercises  Beginner Clam - 10 reps - 2 sets - 1x daily - 7x weekly  Beginner Reverse Clamshell - 10 reps - 2 sets - 1x daily - 7x weekly

## 2017-12-19 NOTE — Therapy (Signed)
[place] [facility] [address] [place], [place], [address] Phone: [phone]   Fax:  [phone]  Physical Therapy Treatment  Patient Details  Name: Carrie Myers MRN: [id number] Date of Birth: 27-Mar-1945 Referring Provider (PT): Dr. Andree Elk   Encounter Date: 12/19/2017  PT End of Session - 12/19/17 1309    Visit Number  2    Number of Visits  9    Date for PT Re-Evaluation  01/03/18    PT Start Time  7654    PT Stop Time  6503    PT Time Calculation (min)  53 min    Activity Tolerance  Patient limited by pain;Patient tolerated treatment well    Behavior During Therapy  [facility] for tasks assessed/performed       Past Medical History:  Diagnosis Date  . Allergy   . Anxiety   . Arthritis    neck, hands, lower back  . Bilateral dry eyes   . Chronic low back pain 12/23/2016  . DDD (degenerative disc disease), lumbar 10/02/2014  . Dental crowns present    Implants  . Fibromyalgia   . Fracture of right foot 01/14/2015  . GERD (gastroesophageal reflux disease)   . Hyperlipidemia   . Hypertension   . Kidney stones   . Myocardial infarction ([place]) 04/22/2006  . Osteoarthritis   . Other shoulder lesions, right shoulder 04/17/2015  . Rheumatoid arthritis ([place])   . Status post shoulder surgery 05/27/2015  . Thyroid disease     Past Surgical History:  Procedure Laterality Date  . ABDOMINAL HYSTERECTOMY     total  . APPENDECTOMY    . BREAST BIOPSY Right    neg  . BREAST CYST ASPIRATION Left    neg  . BUNIONECTOMY Bilateral   . CESAREAN SECTION    . COLONOSCOPY  2010   normal  . CORONARY ANGIOPLASTY WITH STENT PLACEMENT  04/2006  . dental implant     [age]  . epidural steroid injection  2018  . SHOULDER ARTHROSCOPY Right 05/07/2015   Procedure: Extensive arthroscopic debridement and arthroscopic subacromial decompression, right shoulder.  2. Steroid injection right thumb CMC joint.;  Surgeon: Corky Mull,  MD;  Location: [place];  Service: Orthopedics;  Laterality: Right;  . TOOTH EXTRACTION     Upper centrals and lateral    There were no vitals filed for this visit.  Subjective Assessment - 12/19/17 1305    Subjective  Pt. states that she was sick last week and was unable to come to therapy due to sickness.  Pt. states that she felt as though she was not going to live past that day.  She said she is feeling much better after receiving glucose that day and has been better since then.      How long can you stand comfortably?  <10 min    How long can you walk comfortably?  5 min    Patient Stated Goals  Be able to wash dishes/ cook without increase in pain, become more active in gym.    Pain Onset  More than a month ago    Pain Frequency  Constant           Treatment   Manual Therapy:  Supine B Distal Hamstring Stretch 4x30 sec each Supine B Proximal Hamstring Stretch 4x30 sec each Supine B Piriformis Stretch 8x30 sec each Supine B Figure-4 Stretch 4x30 sec each Supine B IR/ER Stretch 4x30 sec each Supine Double Knee-to-Chest  4x30 sec    There Ex:  Sidelying B Clamshells 2x15 Sidelying B Reverse Clamshells 2x15 Supine Bridging 2x15       PT Short Term Goals - 12/06/17 1928      PT SHORT TERM GOAL #1   Title  Pt will be independent with HEP in order to improve strength and decrease back pain in order to improve pain-free function at home and work.     Baseline  Given HEP at initial eval.    Time  1    Period  Weeks    Status  New    Target Date  12/13/17        PT Long Term Goals - 12/06/17 1926      PT LONG TERM GOAL #1   Title  Pt. will complete FOTO and improve to 55 to improve daily functional mobility.    Baseline  9/24 FOTO: 40    Time  4    Period  Weeks    Status  New    Target Date  01/03/18      PT LONG TERM GOAL #2   Title  Pt will decrease worst back pain as reported on NPRS by at least 2 points in order to demonstrate clinically  significant reduction in back pain.     Baseline  9/24 Worst Pain: 8/10    Time  4    Period  Weeks    Status  New    Target Date  01/03/18      PT LONG TERM GOAL #3   Title  Pt will increase strength of B hip by at least 1/2 MMT grade in order to demonstrate improvement in strength and function.    Baseline  9/24 Hip Flexion R/L: 4/4-    Time  4    Period  Weeks    Status  New    Target Date  01/03/18         Plan - 12/19/17 1403    Clinical Impression Statement  Pt. performed well today with manual therapy and the addition of ther ex.  Pt. was unable to perform HEP however the B LE's are moving much better than evaluation.  Pt. reports decreased discomfort with stretching today and was able to tolerate increases in duration of most stretches.      Clinical Presentation  Evolving    Clinical Decision Making  Moderate    Rehab Potential  Fair    PT Frequency  2x / week    PT Duration  4 weeks    PT Treatment/Interventions  Aquatic Therapy;Cryotherapy;Electrical Stimulation;Moist Heat;Traction;Gait Scientist, forensic;Therapeutic activities;Therapeutic exercise;Manual techniques;Patient/family education;Neuromuscular re-education;Balance training;Passive range of motion;Dry needling;Splinting;Taping;Spinal Manipulations;Joint Manipulations    PT Next Visit Plan  Manual Therapy/ Stretches to piriformis    PT Home Exercise Plan  see handouts    Consulted and Agree with Plan of Care  Patient       Patient will benefit from skilled therapeutic intervention in order to improve the following deficits and impairments:  Decreased endurance, Hypomobility, Decreased activity tolerance, Decreased strength, Pain, Difficulty walking, Decreased mobility, Decreased range of motion, Impaired flexibility  Visit Diagnosis: Chronic bilateral low back pain with right-sided sciatica  Muscle weakness (generalized)  Other abnormalities of gait and mobility  Dysphonia     Problem  List Patient Active Problem List   Diagnosis Date Noted  . Moderate episode of recurrent major depressive disorder ([place]) 10/03/2017  . Inflammatory spondylopathy of lumbar region ([place]) 10/03/2017  .  Hoarseness, persistent 04/18/2017  . OSA on CPAP 02/18/2017  . Cervical disc disease 12/31/2016  . Generalized osteoarthritis 12/23/2016  . Chronic pain syndrome 12/23/2016  . Cystocele, midline 12/06/2016  . Urinary incontinence 12/06/2016  . Impingement syndrome of right shoulder 04/17/2015  . Fibrocystic breast 01/18/2015  . Gastroesophageal reflux disease without esophagitis 01/18/2015  . Hypothyroidism, postablative 01/18/2015  . Arteriosclerosis of coronary artery 12/29/2014  . DJD of shoulder 11/13/2014  . Fibromyalgia 10/02/2014  . Intercostal neuralgia 10/02/2014  . Facet syndrome, lumbar 10/02/2014  . Sacroiliac joint disease 10/02/2014  . Carotid artery narrowing 07/09/2014  . Benign essential HTN 07/01/2014  . Osteoarthritis of thumb 09/24/2012  . Mixed hyperlipidemia 09/22/2012  . Arthritis of hand, degenerative 09/22/2012   Pura Spice, PT, DPT # [id number] Gwenlyn Saran, SPT 12/19/2017, 5:29 PM  [facility] [address] [place], [place], [address] Phone: [phone]   Fax:  [phone]  Name: ANGEL HOBDY MRN: [id number] Date of Birth: [birth year]

## 2017-12-20 ENCOUNTER — Other Ambulatory Visit: Payer: Self-pay

## 2017-12-20 ENCOUNTER — Encounter: Payer: Self-pay | Admitting: Anesthesiology

## 2017-12-20 ENCOUNTER — Ambulatory Visit: Payer: Medicare Other | Attending: Anesthesiology | Admitting: Anesthesiology

## 2017-12-20 VITALS — BP 127/95 | HR 88 | Temp 99.2°F | Resp 18 | Ht 68.0 in | Wt 186.0 lb

## 2017-12-20 DIAGNOSIS — M5442 Lumbago with sciatica, left side: Secondary | ICD-10-CM

## 2017-12-20 DIAGNOSIS — M159 Polyosteoarthritis, unspecified: Secondary | ICD-10-CM

## 2017-12-20 DIAGNOSIS — Z7982 Long term (current) use of aspirin: Secondary | ICD-10-CM | POA: Insufficient documentation

## 2017-12-20 DIAGNOSIS — M5136 Other intervertebral disc degeneration, lumbar region: Secondary | ICD-10-CM

## 2017-12-20 DIAGNOSIS — M797 Fibromyalgia: Secondary | ICD-10-CM | POA: Diagnosis not present

## 2017-12-20 DIAGNOSIS — M47812 Spondylosis without myelopathy or radiculopathy, cervical region: Secondary | ICD-10-CM

## 2017-12-20 DIAGNOSIS — Z79891 Long term (current) use of opiate analgesic: Secondary | ICD-10-CM | POA: Diagnosis not present

## 2017-12-20 DIAGNOSIS — M533 Sacrococcygeal disorders, not elsewhere classified: Secondary | ICD-10-CM

## 2017-12-20 DIAGNOSIS — Z79899 Other long term (current) drug therapy: Secondary | ICD-10-CM | POA: Diagnosis not present

## 2017-12-20 DIAGNOSIS — M47816 Spondylosis without myelopathy or radiculopathy, lumbar region: Secondary | ICD-10-CM | POA: Diagnosis not present

## 2017-12-20 DIAGNOSIS — M5441 Lumbago with sciatica, right side: Secondary | ICD-10-CM

## 2017-12-20 DIAGNOSIS — M51369 Other intervertebral disc degeneration, lumbar region without mention of lumbar back pain or lower extremity pain: Secondary | ICD-10-CM

## 2017-12-20 DIAGNOSIS — G8929 Other chronic pain: Secondary | ICD-10-CM

## 2017-12-20 DIAGNOSIS — Z79890 Hormone replacement therapy (postmenopausal): Secondary | ICD-10-CM | POA: Insufficient documentation

## 2017-12-20 DIAGNOSIS — M542 Cervicalgia: Secondary | ICD-10-CM | POA: Diagnosis not present

## 2017-12-20 DIAGNOSIS — I700 Atherosclerosis of aorta: Secondary | ICD-10-CM | POA: Insufficient documentation

## 2017-12-20 DIAGNOSIS — G894 Chronic pain syndrome: Secondary | ICD-10-CM | POA: Diagnosis not present

## 2017-12-20 DIAGNOSIS — F1190 Opioid use, unspecified, uncomplicated: Secondary | ICD-10-CM

## 2017-12-20 MED ORDER — HYDROCODONE-ACETAMINOPHEN 5-325 MG PO TABS: 1.0000 | ORAL_TABLET | Freq: Every day | ORAL | 0 refills | Status: DC

## 2017-12-20 MED ORDER — DIAZEPAM 5 MG PO TABS: ORAL_TABLET | ORAL | 1 refills | Status: DC

## 2017-12-20 NOTE — Progress Notes (Signed)
Subjective:  Patient ID: Carrie Myers, female    DOB: 08/06/1945  Age: 72 y.o. MRN: [id number]  CC: Back Pain (lower); Neck Pain; and Fibromyalgia   Procedure: None  HPI Carrie Myers presents for reevaluation.  She was last seen a few months ago and has been doing reasonably well with her low back pain and hip pain.  The quality characteristic and distribution seem to be stable in nature with no significant changes noted.  She is taking her medication sparingly.  She is generally using 1 Vicodin per day and a half of a Valium at nighttime.  This helps with muscle cramping and her sleep.  The regimen has been working well for her and I have reviewed her narcotic assessment sheet and she continues to derive good functional lifestyle improvement with the medications.  Otherwise she is in her usual state of health.  She has had some recent tooth impaction dental issues and she feels this has aggravated some of her diffuse body pain and fibromyalgia.  Outpatient Medications Prior to Visit  Medication Sig Dispense Refill  . aspirin EC 81 MG tablet Take 81 mg by mouth daily.    . cyclobenzaprine (FLEXERIL) 10 MG tablet TAKE 1 TABLET BY MOUTH 3  TIMES DAILY 270 tablet 0  . Diclofenac-miSOPROStol 75-0.2 MG TBEC TAKE 1 TABLET BY MOUTH TWO  TIMES DAILY 180 tablet 1  . DULoxetine (CYMBALTA) 60 MG capsule Take 1 capsule (60 mg total) by mouth daily. 30 capsule 3  . esomeprazole (NEXIUM) 20 MG capsule Take 40 mg by mouth daily. Over the Counter    . levothyroxine (SYNTHROID, LEVOTHROID) 88 MCG tablet TAKE 1 TABLET BY MOUTH  DAILY 90 tablet 3  . losartan (COZAAR) 50 MG tablet Take 50 mg by mouth daily.    . Multiple Vitamin (MULTI-VITAMINS) TABS Take by mouth.    . NON FORMULARY     . ondansetron (ZOFRAN ODT) 4 MG disintegrating tablet Take 1 tablet (4 mg total) by mouth every 8 (eight) hours as needed for nausea or vomiting. 2 tablet 0  . pravastatin (PRAVACHOL) 80 MG tablet Take 80 mg by  mouth daily.    Marland Kitchen tolterodine (DETROL LA) 4 MG 24 hr capsule Take 1 capsule (4 mg total) by mouth daily. 30 capsule 11  . traMADol (ULTRAM) 50 MG tablet Take 1 tablet (50 mg total) by mouth every 6 (six) hours as needed. 90 tablet 3  . diazepam (VALIUM) 5 MG tablet Limit  1/2  tablet by mouth per day or every other day if tolerated 15 tablet 1  . HYDROcodone-acetaminophen (NORCO/VICODIN) 5-325 MG tablet Take 1 tablet by mouth at bedtime. Limit 1-2 tablets by mouth per day if tolerated 30 tablet 0   No facility-administered medications prior to visit.     Review of Systems CNS: No confusion or sedation Cardiac: No angina or palpitations GI: No abdominal pain or constipation Constitutional: No nausea vomiting fevers or chills  Objective:  BP (!) 127/95   Pulse 88   Temp 99.2 F (37.3 C) (Oral)   Resp 18   Ht 5\' 8"  (1.727 m)   Wt 186 lb (84.4 kg)   SpO2 97%   BMI 28.28 kg/m    BP Readings from Last 3 Encounters:  12/20/17 (!) 127/95  12/11/17 131/67  12/05/17 128/80     Wt Readings from Last 3 Encounters:  12/20/17 186 lb (84.4 kg)  12/11/17 184 lb 15.5 oz (83.9 kg)  12/05/17 185  lb (83.9 kg)     Physical Exam Pt is alert and oriented PERRL EOMI HEART IS RRR no murmur or rub LCTA no wheezing or rales MUSCULOSKELETAL reveals some mild paraspinous muscle tenderness.  She does have some pain with going from seated to standing position.  Her muscle tone and bulk to the lower extremities is at baseline  Labs  No results found for: HGBA1C Lab Results  Component Value Date   LDLCALC 47 03/18/2017   CREATININE 0.91 12/11/2017    -------------------------------------------------------------------------------------------------------------------- Lab Results  Component Value Date   WBC 14.0 (H) 12/11/2017   HGB 14.2 12/11/2017   HCT 41.1 12/11/2017   PLT 292 12/11/2017   GLUCOSE 121 (H) 12/11/2017   CHOL 122 03/18/2017   TRIG 163 (H) 03/18/2017   HDL 42  03/18/2017   LDLCALC 47 03/18/2017   ALT 30 12/11/2017   AST 35 12/11/2017   NA 139 12/11/2017   K 4.3 12/11/2017   CL 101 12/11/2017   CREATININE 0.91 12/11/2017   BUN 23 12/11/2017   CO2 24 12/11/2017   TSH 4.730 (H) 03/18/2017    --------------------------------------------------------------------------------------------------------------------- Dg Chest Portable 1 View  Result Date: 12/11/2017 CLINICAL DATA:  Dizziness and vomiting EXAM: PORTABLE CHEST 1 VIEW COMPARISON:  April 26, 2008 FINDINGS: There is no edema or consolidation. The heart size and pulmonary vascularity are normal. No adenopathy. There is aortic atherosclerosis. No evident bone lesions. IMPRESSION: Aortic atherosclerosis.  No edema or consolidation. Aortic Atherosclerosis (ICD10-I70.0). Electronically Signed   By: Lowella Grip III M.D.   On: 12/11/2017 13:55     Assessment & Plan:   Carrie Myers was seen today for back pain, neck pain and fibromyalgia.  Diagnoses and all orders for this visit:  Chronic bilateral low back pain with bilateral sciatica  DDD (degenerative disc disease), lumbar  Chronic pain syndrome -     ToxASSURE Select 13 (MW), Urine  Facet syndrome, lumbar  Fibromyalgia  Cervical facet joint syndrome  Sacroiliac joint disease  Generalized osteoarthritis  Chronic, continuous use of opioids -     ToxASSURE Select 13 (MW), Urine  Other orders -     Discontinue: HYDROcodone-acetaminophen (NORCO/VICODIN) 5-325 MG tablet; Take 1 tablet by mouth at bedtime. Limit 1-2 tablets by mouth per day if tolerated -     HYDROcodone-acetaminophen (NORCO/VICODIN) 5-325 MG tablet; Take 1 tablet by mouth at bedtime. Limit 1-2 tablets by mouth per day if tolerated -     diazepam (VALIUM) 5 MG tablet; Limit  1/2  tablet by mouth per day or every other day if  tolerated        ----------------------------------------------------------------------------------------------------------------------  Problem List Items Addressed This Visit      Unprioritized   Chronic pain syndrome (Chronic)   Relevant Orders   ToxASSURE Select 13 (MW), Urine   Facet syndrome, lumbar   Relevant Medications   HYDROcodone-acetaminophen (NORCO/VICODIN) 5-325 MG tablet   Fibromyalgia   Generalized osteoarthritis   Relevant Medications   HYDROcodone-acetaminophen (NORCO/VICODIN) 5-325 MG tablet   Sacroiliac joint disease (Chronic)    Other Visit Diagnoses    Chronic bilateral low back pain with bilateral sciatica    -  Primary   Relevant Medications   HYDROcodone-acetaminophen (NORCO/VICODIN) 5-325 MG tablet   diazepam (VALIUM) 5 MG tablet   DDD (degenerative disc disease), lumbar       Relevant Medications   HYDROcodone-acetaminophen (NORCO/VICODIN) 5-325 MG tablet   Cervical facet joint syndrome       Relevant Medications  HYDROcodone-acetaminophen (NORCO/VICODIN) 5-325 MG tablet   Chronic, continuous use of opioids       Relevant Orders   ToxASSURE Select 13 (MW), Urine        ----------------------------------------------------------------------------------------------------------------------  1. Chronic bilateral low back pain with bilateral sciatica Defer on any repeat injections.  She has responded favorably to epidural steroids in the past but will defer on these.  I want her to continue with efforts at weight loss and stretching strengthening exercises.  And to continue core physical therapy modalities.  2. DDD (degenerative disc disease), lumbar Above  3. Chronic pain syndrome Have reviewed the [facility] practitioner database information and it is appropriate.  A baseline UDS will be requested today.  Refills for Vicodin will be for October 8 and November 7 - ToxASSURE Select 13 (MW), Urine  4. Facet syndrome, lumbar As  above  5. Fibromyalgia   6. Cervical facet joint syndrome   7. Sacroiliac joint disease   8. Generalized osteoarthritis   9. Chronic, continuous use of opioids As above - ToxASSURE Select 13 (MW), Urine    ----------------------------------------------------------------------------------------------------------------------  I am having Carrie Myers maintain her esomeprazole, losartan, pravastatin, aspirin EC, tolterodine, MULTI-VITAMINS, NON FORMULARY, levothyroxine, DULoxetine, traMADol, Diclofenac-miSOPROStol, cyclobenzaprine, ondansetron, HYDROcodone-acetaminophen, and diazepam.   Meds ordered this encounter  Medications  . DISCONTD: HYDROcodone-acetaminophen (NORCO/VICODIN) 5-325 MG tablet    Sig: Take 1 tablet by mouth at bedtime. Limit 1-2 tablets by mouth per day if tolerated    Dispense:  30 tablet    Refill:  0    Do Not Fill Until [id number]  . HYDROcodone-acetaminophen (NORCO/VICODIN) 5-325 MG tablet    Sig: Take 1 tablet by mouth at bedtime. Limit 1-2 tablets by mouth per day if tolerated    Dispense:  30 tablet    Refill:  0    Do Not Fill Until [id number]  . diazepam (VALIUM) 5 MG tablet    Sig: Limit  1/2  tablet by mouth per day or every other day if tolerated    Dispense:  15 tablet    Refill:  1    Needs to last one month   Patient's Medications  New Prescriptions   No medications on file  Previous Medications   ASPIRIN EC 81 MG TABLET    Take 81 mg by mouth daily.   CYCLOBENZAPRINE (FLEXERIL) 10 MG TABLET    TAKE 1 TABLET BY MOUTH 3  TIMES DAILY   DICLOFENAC-MISOPROSTOL 75-0.2 MG TBEC    TAKE 1 TABLET BY MOUTH TWO  TIMES DAILY   DULOXETINE (CYMBALTA) 60 MG CAPSULE    Take 1 capsule (60 mg total) by mouth daily.   ESOMEPRAZOLE (NEXIUM) 20 MG CAPSULE    Take 40 mg by mouth daily. Over the Counter   LEVOTHYROXINE (SYNTHROID, LEVOTHROID) 88 MCG TABLET    TAKE 1 TABLET BY MOUTH  DAILY   LOSARTAN (COZAAR) 50 MG TABLET    Take 50 mg by mouth daily.    MULTIPLE VITAMIN (MULTI-VITAMINS) TABS    Take by mouth.   NON FORMULARY       ONDANSETRON (ZOFRAN ODT) 4 MG DISINTEGRATING TABLET    Take 1 tablet (4 mg total) by mouth every 8 (eight) hours as needed for nausea or vomiting.   PRAVASTATIN (PRAVACHOL) 80 MG TABLET    Take 80 mg by mouth daily.   TOLTERODINE (DETROL LA) 4 MG 24 HR CAPSULE    Take 1 capsule (4 mg total) by mouth daily.  TRAMADOL (ULTRAM) 50 MG TABLET    Take 1 tablet (50 mg total) by mouth every 6 (six) hours as needed.  Modified Medications   Modified Medication Previous Medication   DIAZEPAM (VALIUM) 5 MG TABLET diazepam (VALIUM) 5 MG tablet      Limit  1/2  tablet by mouth per day or every other day if tolerated    Limit  1/2  tablet by mouth per day or every other day if tolerated   HYDROCODONE-ACETAMINOPHEN (NORCO/VICODIN) 5-325 MG TABLET HYDROcodone-acetaminophen (NORCO/VICODIN) 5-325 MG tablet      Take 1 tablet by mouth at bedtime. Limit 1-2 tablets by mouth per day if tolerated    Take 1 tablet by mouth at bedtime. Limit 1-2 tablets by mouth per day if tolerated  Discontinued Medications   No medications on file   ----------------------------------------------------------------------------------------------------------------------  Follow-up: Return in about 2 months (around 02/19/2018) for evaluation.    Molli Barrows, MD

## 2017-12-20 NOTE — Progress Notes (Signed)
Nursing Pain Medication Assessment:  Safety precautions to be maintained throughout the outpatient stay will include: orient to surroundings, keep bed in low position, maintain call bell within reach at all times, provide assistance with transfer out of bed and ambulation.  Medication Inspection Compliance: Pill count conducted under aseptic conditions, in front of the patient. Neither the pills nor the bottle was removed from the patient's sight at any time. Once count was completed pills were immediately returned to the patient in their original bottle.  Medication #1: Hydrocodone/APAP Pill/Patch Count: 0 of 30 pills remain Pill/Patch Appearance: Markings consistent with prescribed medication Bottle Appearance: Standard pharmacy container. Clearly labeled. Filled Date: 08 / 06 / 2019 Last Medication intake:  Ran out of medicine more than 48 hours ago  Medication #2: Tramadol (Ultram) Pill/Patch Count: 12 of 90 pills remain Pill/Patch Appearance: Markings consistent with prescribed medication Bottle Appearance: Standard pharmacy container. Clearly labeled. Filled Date: 09 / 10 / 2019 Last Medication intake:  Today

## 2017-12-20 NOTE — Patient Instructions (Signed)
You were given 2 prescriptions for Hydrocodone and one prescription for Valium today.

## 2017-12-21 ENCOUNTER — Ambulatory Visit: Payer: Medicare Other | Admitting: Physical Therapy

## 2017-12-21 DIAGNOSIS — G8929 Other chronic pain: Secondary | ICD-10-CM | POA: Diagnosis not present

## 2017-12-21 DIAGNOSIS — M6281 Muscle weakness (generalized): Secondary | ICD-10-CM

## 2017-12-21 DIAGNOSIS — R490 Dysphonia: Secondary | ICD-10-CM | POA: Diagnosis not present

## 2017-12-21 DIAGNOSIS — R2689 Other abnormalities of gait and mobility: Secondary | ICD-10-CM

## 2017-12-21 DIAGNOSIS — M5441 Lumbago with sciatica, right side: Secondary | ICD-10-CM | POA: Diagnosis not present

## 2017-12-21 NOTE — Therapy (Signed)
[facility] [address] [place], [place], [address] Phone: [phone]   Fax:  [phone]  Physical Therapy Treatment  Patient Details  Name: Carrie Myers MRN: [id number] Date of Birth: 1945/06/08 Referring Provider (PT): Dr. Andree Elk   Encounter Date: 12/21/2017  PT End of Session - 12/21/17 1300    Visit Number  3    Number of Visits  9    Date for PT Re-Evaluation  01/03/18    PT Start Time  7654    PT Stop Time  6503    PT Time Calculation (min)  51 min    Activity Tolerance  Patient limited by pain;Patient tolerated treatment well    Behavior During Therapy  [facility] for tasks assessed/performed       Past Medical History:  Diagnosis Date  . Allergy   . Anxiety   . Arthritis    neck, hands, lower back  . Bilateral dry eyes   . Chronic low back pain 12/23/2016  . DDD (degenerative disc disease), lumbar 10/02/2014  . Dental crowns present    Implants  . Fibromyalgia   . Fracture of right foot 01/14/2015  . GERD (gastroesophageal reflux disease)   . Hyperlipidemia   . Hypertension   . Kidney stones   . Myocardial infarction ([place]) 04/22/2006  . Osteoarthritis   . Other shoulder lesions, right shoulder 04/17/2015  . Rheumatoid arthritis ([place])   . Status post shoulder surgery 05/27/2015  . Thyroid disease     Past Surgical History:  Procedure Laterality Date  . ABDOMINAL HYSTERECTOMY     total  . APPENDECTOMY    . BREAST BIOPSY Right    neg  . BREAST CYST ASPIRATION Left    neg  . BUNIONECTOMY Bilateral   . CESAREAN SECTION    . COLONOSCOPY  2010   normal  . CORONARY ANGIOPLASTY WITH STENT PLACEMENT  04/2006  . dental implant     seven  . epidural steroid injection  2018  . SHOULDER ARTHROSCOPY Right 05/07/2015   Procedure: Extensive arthroscopic debridement and arthroscopic subacromial decompression, right shoulder.  2. Steroid injection right thumb CMC joint.;  Surgeon: Corky Mull,  MD;  Location: [place];  Service: Orthopedics;  Laterality: Right;  . TOOTH EXTRACTION     Upper centrals and lateral    There were no vitals filed for this visit.  Subjective Assessment - 12/21/17 1258    Subjective  Pt. reports that her visit to the MD went well and he was impressed with PT and and progress being made in clinic.  Pt. states she was unable to perform her HEP due to being at the MD yesterday.  Pt. states that her grandkids will be spending the night Friday and they will be decorating for Halloween.    How long can you stand comfortably?  <10 min    How long can you walk comfortably?  5 min    Patient Stated Goals  Be able to wash dishes/ cook without increase in pain, become more active in gym.    Currently in Pain?  Yes    Pain Score  3     Pain Location  Back    Pain Orientation  Lower    Pain Onset  More than a month ago         Treatment   Manual Therapy:  Supine B Distal Hamstring Stretch 4x30 sec each Supine B Proximal Hamstring Stretch 4x30 sec each  Supine B Piriformis Stretch 8x30 sec each Supine B Figure-4 Stretch 4x30 sec each Supine B IR/ER Stretch 4x30 sec each Supine Double Knee-to-Chest 4x30 sec Prone B Quad Stretch 4x30 sec each Prone STM to UT, Cervical Region  Prone Grade II-III Joint Mobilizations (PA, Unilateral) x multiple bouts  Pt. Notes discomfort in Thoracic region and Cervical.       PT Short Term Goals - 12/06/17 1928      PT SHORT TERM GOAL #1   Title  Pt will be independent with HEP in order to improve strength and decrease back pain in order to improve pain-free function at home and work.     Baseline  Given HEP at initial eval.    Time  1    Period  Weeks    Status  New    Target Date  12/13/17        PT Long Term Goals - 12/06/17 1926      PT LONG TERM GOAL #1   Title  Pt. will complete FOTO and improve to 55 to improve daily functional mobility.    Baseline  9/24 FOTO: 40    Time  4    Period   Weeks    Status  New    Target Date  01/03/18      PT LONG TERM GOAL #2   Title  Pt will decrease worst back pain as reported on NPRS by at least 2 points in order to demonstrate clinically significant reduction in back pain.     Baseline  9/24 Worst Pain: 8/10    Time  4    Period  Weeks    Status  New    Target Date  01/03/18      PT LONG TERM GOAL #3   Title  Pt will increase strength of B hip by at least 1/2 MMT grade in order to demonstrate improvement in strength and function.    Baseline  9/24 Hip Flexion R/L: 4/4-    Time  4    Period  Weeks    Status  New    Target Date  01/03/18            Plan - 12/21/17 1300    Clinical Impression Statement  Pt. tolerated manual therapy noting slight discomfort with joint mobilizations in the cervical and thoracic region.  Pt. noted to have several trigger points in UT that were treated with discomfort noted by patient.  Pt. educated on trigger points and benefit of trigger point release technique.  Pt. also noted to be hypomobile in the thoracic and lumbar region with joint mobilization.  Pt. reported that she will be more compliant with HEP over the weekend.    Clinical Presentation  Evolving    Clinical Decision Making  Moderate    Rehab Potential  Fair    PT Frequency  2x / week    PT Duration  4 weeks    PT Treatment/Interventions  Aquatic Therapy;Cryotherapy;Electrical Stimulation;Moist Heat;Traction;Gait Scientist, forensic;Therapeutic activities;Therapeutic exercise;Manual techniques;Patient/family education;Neuromuscular re-education;Balance training;Passive range of motion;Dry needling;Splinting;Taping;Spinal Manipulations;Joint Manipulations    PT Next Visit Plan  Manual Therapy/ Stretches to piriformis    PT Home Exercise Plan  see handouts    Consulted and Agree with Plan of Care  Patient       Patient will benefit from skilled therapeutic intervention in order to improve the following deficits and impairments:   Decreased endurance, Hypomobility, Decreased activity tolerance, Decreased strength, Pain, Difficulty walking, Decreased  mobility, Decreased range of motion, Impaired flexibility  Visit Diagnosis: Chronic bilateral low back pain with right-sided sciatica  Muscle weakness (generalized)  Other abnormalities of gait and mobility     Problem List Patient Active Problem List   Diagnosis Date Noted  . Tobacco use disorder, moderate, in sustained remission 12/22/2017  . Moderate episode of recurrent major depressive disorder ([employer] [facility] [employer]) 10/03/2017  . Inflammatory spondylopathy of lumbar region ([place]) 10/03/2017  . Hoarseness, persistent 04/18/2017  . OSA on CPAP 02/18/2017  . Cervical disc disease 12/31/2016  . Generalized osteoarthritis 12/23/2016  . Chronic pain syndrome 12/23/2016  . Cystocele, midline 12/06/2016  . Urinary incontinence 12/06/2016  . Impingement syndrome of right shoulder 04/17/2015  . Fibrocystic breast 01/18/2015  . Gastroesophageal reflux disease without esophagitis 01/18/2015  . Hypothyroidism, postablative 01/18/2015  . Arteriosclerosis of coronary artery 12/29/2014  . DJD of shoulder 11/13/2014  . Fibromyalgia 10/02/2014  . Intercostal neuralgia 10/02/2014  . Facet syndrome, lumbar 10/02/2014  . Sacroiliac joint disease 10/02/2014  . Carotid artery narrowing 07/09/2014  . Benign essential HTN 07/01/2014  . Osteoarthritis of thumb 09/24/2012  . Mixed hyperlipidemia 09/22/2012  . Arthritis of hand, degenerative 09/22/2012   Pura Spice, PT, DPT # [id number] 12/22/2017, 11:18 AM  [place] [facility] [address] [place], [place], [address] Phone: [phone]   Fax:  [phone]  Name: CARROL HOUGLAND MRN: [id number] Date of Birth: Sep 26, 1945

## 2017-12-22 ENCOUNTER — Ambulatory Visit (INDEPENDENT_AMBULATORY_CARE_PROVIDER_SITE_OTHER): Payer: Medicare Other | Admitting: Internal Medicine

## 2017-12-22 ENCOUNTER — Encounter: Payer: Self-pay | Admitting: Physical Therapy

## 2017-12-22 ENCOUNTER — Encounter: Payer: Self-pay | Admitting: Internal Medicine

## 2017-12-22 VITALS — BP 122/78 | HR 86 | Temp 98.3°F | Ht 68.0 in | Wt 182.0 lb

## 2017-12-22 DIAGNOSIS — R590 Localized enlarged lymph nodes: Secondary | ICD-10-CM

## 2017-12-22 DIAGNOSIS — F17201 Nicotine dependence, unspecified, in remission: Secondary | ICD-10-CM | POA: Diagnosis not present

## 2017-12-22 MED ORDER — CLINDAMYCIN HCL 300 MG PO CAPS: 300.0000 mg | ORAL_CAPSULE | Freq: Three times a day (TID) | ORAL | 0 refills | Status: AC

## 2017-12-22 NOTE — Progress Notes (Signed)
Date:  12/22/2017   Name:  Carrie Myers   DOB:  17-Nov-1945   MRN:  [id number]   Chief Complaint: Facial Swelling (Patient stated jaw is swollen. Feel lump and is painful on right side of face. Noticed last night after eating dinner. )  Facial swelling - she noticed sudden onset of swelling around right jaw and ear last evening after supper.  There was no pain but she felt the fullness.  Over night the area is more tender but not more swollen. She has no fever or chills, no ear or sinus pain.  No trouble swallowing.  She had some dental work done last week.  Review of Systems  Constitutional: Negative for chills, fatigue and fever.  HENT: Positive for facial swelling. Negative for ear discharge, hearing loss, sore throat and trouble swallowing.   Respiratory: Negative for cough, choking, shortness of breath and wheezing.   Cardiovascular: Negative for chest pain and palpitations.    Patient Active Problem List   Diagnosis Date Noted  . Moderate episode of recurrent major depressive disorder ([place]) 10/03/2017  . Inflammatory spondylopathy of lumbar region ([place]) 10/03/2017  . Hoarseness, persistent 04/18/2017  . OSA on CPAP 02/18/2017  . Cervical disc disease 12/31/2016  . Generalized osteoarthritis 12/23/2016  . Chronic pain syndrome 12/23/2016  . Cystocele, midline 12/06/2016  . Urinary incontinence 12/06/2016  . Impingement syndrome of right shoulder 04/17/2015  . Fibrocystic breast 01/18/2015  . Gastroesophageal reflux disease without esophagitis 01/18/2015  . Hypothyroidism, postablative 01/18/2015  . Arteriosclerosis of coronary artery 12/29/2014  . DJD of shoulder 11/13/2014  . Fibromyalgia 10/02/2014  . Intercostal neuralgia 10/02/2014  . Facet syndrome, lumbar 10/02/2014  . Sacroiliac joint disease 10/02/2014  . Carotid artery narrowing 07/09/2014  . Benign essential HTN 07/01/2014  . Osteoarthritis of thumb 09/24/2012  . Mixed hyperlipidemia 09/22/2012  .  Arthritis of hand, degenerative 09/22/2012    Allergies  Allergen Reactions  . Augmentin [Amoxicillin-Pot Clavulanate] Nausea And Vomiting  . Codeine Nausea And Vomiting  . Sulfa Antibiotics Nausea And Vomiting  . Proparacaine Itching  . Shellfish Allergy Nausea And Vomiting, Nausea Only and Other (See Comments)    No issues with iodine     Past Surgical History:  Procedure Laterality Date  . ABDOMINAL HYSTERECTOMY     total  . APPENDECTOMY    . BREAST BIOPSY Right    neg  . BREAST CYST ASPIRATION Left    neg  . BUNIONECTOMY Bilateral   . CESAREAN SECTION    . COLONOSCOPY  2010   normal  . CORONARY ANGIOPLASTY WITH STENT PLACEMENT  04/2006  . dental implant     seven  . epidural steroid injection  2018  . SHOULDER ARTHROSCOPY Right 05/07/2015   Procedure: Extensive arthroscopic debridement and arthroscopic subacromial decompression, right shoulder.  2. Steroid injection right thumb CMC joint.;  Surgeon: Corky Mull, MD;  Location: [place];  Service: Orthopedics;  Laterality: Right;  . TOOTH EXTRACTION     Upper centrals and lateral    Social History   Tobacco Use  . Smoking status: Former Smoker    Types: Cigarettes  . Smokeless tobacco: Former Systems developer    Quit date: 04/22/2006  . Tobacco comment: quit 8 years  Substance Use Topics  . Alcohol use: No    Alcohol/week: 0.0 standard drinks  . Drug use: No     Medication list has been reviewed and updated.  Current Meds  Medication Sig  .  aspirin EC 81 MG tablet Take 81 mg by mouth daily.  . cyclobenzaprine (FLEXERIL) 10 MG tablet TAKE 1 TABLET BY MOUTH 3  TIMES DAILY  . diazepam (VALIUM) 5 MG tablet Limit  1/2  tablet by mouth per day or every other day if tolerated  . Diclofenac-miSOPROStol 75-0.2 MG TBEC TAKE 1 TABLET BY MOUTH TWO  TIMES DAILY  . DULoxetine (CYMBALTA) 60 MG capsule Take 1 capsule (60 mg total) by mouth daily.  Marland Kitchen esomeprazole (NEXIUM) 20 MG capsule Take 40 mg by  mouth daily. Over the Counter  . HYDROcodone-acetaminophen (NORCO/VICODIN) 5-325 MG tablet Take 1 tablet by mouth at bedtime. Limit 1-2 tablets by mouth per day if tolerated  . levothyroxine (SYNTHROID, LEVOTHROID) 88 MCG tablet TAKE 1 TABLET BY MOUTH  DAILY  . losartan (COZAAR) 50 MG tablet Take 50 mg by mouth daily.  . Multiple Vitamin (MULTI-VITAMINS) TABS Take by mouth.  . NON FORMULARY   . ondansetron (ZOFRAN ODT) 4 MG disintegrating tablet Take 1 tablet (4 mg total) by mouth every 8 (eight) hours as needed for nausea or vomiting.  . pravastatin (PRAVACHOL) 80 MG tablet Take 80 mg by mouth daily.  Marland Kitchen tolterodine (DETROL LA) 4 MG 24 hr capsule Take 1 capsule (4 mg total) by mouth daily.  . traMADol (ULTRAM) 50 MG tablet Take 1 tablet (50 mg total) by mouth every 6 (six) hours as needed.    PHQ 2/9 Scores 12/20/2017 12/05/2017 11/08/2017 10/03/2017  PHQ - 2 Score 0 0 0 0  PHQ- 9 Score - - - 1  Exception Documentation - - - -    Physical Exam  Constitutional: She is oriented to person, place, and time. She appears well-developed. No distress.  HENT:  Head: Normocephalic and atraumatic.    Right Ear: Tympanic membrane and ear canal normal.  Left Ear: Tympanic membrane and ear canal normal.  Nose: Right sinus exhibits no maxillary sinus tenderness and no frontal sinus tenderness. Left sinus exhibits no maxillary sinus tenderness and no frontal sinus tenderness.  Mouth/Throat: Oropharynx is clear and moist and mucous membranes are normal. Abnormal dentition (rear molars absent bilaterally). No dental abscesses.  Cardiovascular: Normal rate, regular rhythm and normal heart sounds.  Pulmonary/Chest: Effort normal and breath sounds normal. No respiratory distress.  Musculoskeletal: Normal range of motion.  Neurological: She is alert and oriented to person, place, and time.  Skin: Skin is warm and dry. No rash noted.  Psychiatric: She has a normal mood and affect. Her behavior is normal.  Thought content normal.  Nursing note and vitals reviewed.   BP 122/78 (BP Location: Right Arm, Patient Position: Sitting, Cuff Size: Normal)   Pulse 86   Temp 98.3 F (36.8 C) (Oral)   Ht 5\' 8"  (1.727 m)   Wt 182 lb (82.6 kg)   SpO2 99%   BMI 27.67 kg/m   Assessment and Plan: 1. Preauricular adenopathy Suspect occult dental infection If no improvement or worsening, would follow up with ENT to rule head/neck pathology - clindamycin (CLEOCIN) 300 MG capsule; Take 1 capsule (300 mg total) by mouth 3 (three) times daily for 10 days.  Dispense: 30 capsule; Refill: 0  2. Tobacco use disorder, moderate, in sustained remission Pt interested in low dose screening lung CT   Partially dictated using Editor, commissioning. Any errors are unintentional.  Halina Maidens, MD [facility]  12/22/2017

## 2017-12-23 ENCOUNTER — Telehealth: Payer: Self-pay | Admitting: *Deleted

## 2017-12-23 ENCOUNTER — Encounter: Payer: Self-pay | Admitting: *Deleted

## 2017-12-23 DIAGNOSIS — Z122 Encounter for screening for malignant neoplasm of respiratory organs: Secondary | ICD-10-CM

## 2017-12-23 NOTE — Telephone Encounter (Signed)
Received a referral for initial lung cancer screening scan.  Contacted the patient and obtained their smoking history, former smoker quit 2008 with 55 pkyr history  as well as answering questions related to screening process.  Patient denies signs of lung cancer such as weight loss or hemoptysis at this time.  Patient denies comorbidity that would prevent curative treatment if lung cancer were found.  Patient is scheduled for the Shared Decision Making Visit and CT scan on 01-19-18@1345 .

## 2017-12-25 LAB — TOXASSURE SELECT 13 (MW), URINE

## 2017-12-26 ENCOUNTER — Ambulatory Visit: Payer: Medicare Other | Admitting: Physical Therapy

## 2017-12-28 ENCOUNTER — Encounter: Payer: Medicare Other | Admitting: Physical Therapy

## 2018-01-02 ENCOUNTER — Encounter: Payer: Medicare Other | Admitting: Physical Therapy

## 2018-01-04 ENCOUNTER — Encounter: Payer: Medicare Other | Admitting: Physical Therapy

## 2018-01-10 ENCOUNTER — Ambulatory Visit: Payer: Medicare Other | Admitting: Physical Therapy

## 2018-01-10 ENCOUNTER — Encounter: Payer: Self-pay | Admitting: Physical Therapy

## 2018-01-10 DIAGNOSIS — M6281 Muscle weakness (generalized): Secondary | ICD-10-CM

## 2018-01-10 DIAGNOSIS — G8929 Other chronic pain: Secondary | ICD-10-CM | POA: Diagnosis not present

## 2018-01-10 DIAGNOSIS — R2689 Other abnormalities of gait and mobility: Secondary | ICD-10-CM

## 2018-01-10 DIAGNOSIS — M5441 Lumbago with sciatica, right side: Secondary | ICD-10-CM | POA: Diagnosis not present

## 2018-01-10 DIAGNOSIS — R490 Dysphonia: Secondary | ICD-10-CM | POA: Diagnosis not present

## 2018-01-10 NOTE — Therapy (Signed)
[place] [facility] [address] [place], [place], [address] Phone: [phone]   Fax:  [phone]  Physical Therapy Treatment  Patient Details  Name: Carrie Myers MRN: [id number] Date of Birth: August 29, 1945 Referring Provider (PT): Dr. Andree Elk   Encounter Date: 01/10/2018     Treatment: 4 of 11.   Recert date:  71/06/26 1528 to 1626    Past Medical History:  Diagnosis Date  . Allergy   . Anxiety   . Arthritis    neck, hands, lower back  . Bilateral dry eyes   . Chronic low back pain 12/23/2016  . DDD (degenerative disc disease), lumbar 10/02/2014  . Dental crowns present    Implants  . Fibromyalgia   . Fracture of right foot 01/14/2015  . GERD (gastroesophageal reflux disease)   . Hyperlipidemia   . Hypertension   . Kidney stones   . Myocardial infarction ([place]) 04/22/2006  . Osteoarthritis   . Other shoulder lesions, right shoulder 04/17/2015  . Rheumatoid arthritis ([place])   . Status post shoulder surgery 05/27/2015  . Thyroid disease     Past Surgical History:  Procedure Laterality Date  . ABDOMINAL HYSTERECTOMY     total  . APPENDECTOMY    . BREAST BIOPSY Right    neg  . BREAST CYST ASPIRATION Left    neg  . BUNIONECTOMY Bilateral   . CESAREAN SECTION    . COLONOSCOPY  2010   normal  . CORONARY ANGIOPLASTY WITH STENT PLACEMENT  04/2006  . dental implant     seven  . epidural steroid injection  2018  . SHOULDER ARTHROSCOPY Right 05/07/2015   Procedure: Extensive arthroscopic debridement and arthroscopic subacromial decompression, right shoulder.  2. Steroid injection right thumb CMC joint.;  Surgeon: Corky Mull, MD;  Location: [place];  Service: Orthopedics;  Laterality: Right;  . TOOTH EXTRACTION     Upper centrals and lateral    There were no vitals filed for this visit.      [facility] PT Assessment - 01/12/18 0001      Assessment   Medical Diagnosis  LBP with Radicular  Sx    Referring Provider (PT)  Dr. Andree Elk    Onset Date/Surgical Date  03/15/17    Hand Dominance  Right      Prior Function   Level of Independence  Independent        Pt. reports she went to [place], [place] last week and noticed climbing hills the whole time she was there. She states walking with the dog has been good exercise.        Treatment   Manual Therapy:  Supine B Distal Hamstring Stretch 4x30 sec each Supine B Proximal Hamstring Stretch 4x30 sec each Supine B Piriformis Stretch8x30 sec each Supine B Figure-4 Stretch 4x30 sec each Supine B IR/ER Stretch 4x30 sec each Supine Double Knee-to-Chest 4x30 sec Seated STM to UT, Cervical Region    Pt. Educated on importance of PT and HEP going forward and was advised that she would be getting new HEP at next visit to include core activities necessary for pain relief in back.          Pt. continues to demonstrate decreased generalized mobility of the L LE. Pt. performs well with manual therapy noting a decrease in pain with stretches since first examination. Pt. encouraged and directed to perform HEP and at next visit would be introduced to new core stabilizing exercises in order to decrease  the load management of spine. Pt. agreeable to change and will be more diligent with exercises program.    PT Short Term Goals - 01/12/18 1497      PT SHORT TERM GOAL #1   Title  Pt will be independent with HEP in order to improve strength and decrease back pain in order to improve pain-free function at home and work.     Baseline  Pt. understands importance of HEP    Period  Weeks    Status  Achieved    Target Date  01/10/18        PT Long Term Goals - 01/10/18 1541      PT LONG TERM GOAL #1   Title  Pt. will complete FOTO and improve to 55 to improve daily functional mobility.    Baseline  9/24 FOTO: 40; 10/29 FOTO: 47    Time  4    Period  Weeks    Status  Partially Met    Target Date  02/07/18      PT LONG TERM  GOAL #2   Title  Pt will decrease worst back pain as reported on NPRS by at least 2 points in order to demonstrate clinically significant reduction in back pain.     Baseline  9/24 Worst Pain: 8/10; 01/10/18 Worst Pain: 8/10    Time  4    Period  Weeks    Status  Partially Met    Target Date  02/07/18      PT LONG TERM GOAL #3   Title  Pt will increase strength of B hip by at least 1/2 MMT grade in order to demonstrate improvement in strength and function.    Baseline  9/24 Hip Flexion R/L: 4/4    Time  4    Period  Weeks    Status  Partially Met    Target Date  02/07/18      PT LONG TERM GOAL #4   Title  Pt. will improve posture to enable her to cook full kitchen meal without any increase in pain.    Baseline  Pt. currently reports pain immediately after bending over.    Time  4    Period  Weeks    Status  New    Target Date  02/07/18         Patient will benefit from skilled therapeutic intervention in order to improve the following deficits and impairments:  Decreased endurance, Hypomobility, Decreased activity tolerance, Decreased strength, Pain, Difficulty walking, Decreased mobility, Decreased range of motion, Impaired flexibility  Visit Diagnosis: Chronic bilateral low back pain with right-sided sciatica  Muscle weakness (generalized)  Other abnormalities of gait and mobility     Problem List Patient Active Problem List   Diagnosis Date Noted  . Tobacco use disorder, moderate, in sustained remission 12/22/2017  . Moderate episode of recurrent major depressive disorder ([place]) 10/03/2017  . Inflammatory spondylopathy of lumbar region ([place]) 10/03/2017  . Hoarseness, persistent 04/18/2017  . OSA on CPAP 02/18/2017  . Cervical disc disease 12/31/2016  . Generalized osteoarthritis 12/23/2016  . Chronic pain syndrome 12/23/2016  . Cystocele, midline 12/06/2016  . Urinary incontinence 12/06/2016  . Impingement syndrome of right shoulder 04/17/2015  . Fibrocystic  breast 01/18/2015  . Gastroesophageal reflux disease without esophagitis 01/18/2015  . Hypothyroidism, postablative 01/18/2015  . Arteriosclerosis of coronary artery 12/29/2014  . DJD of shoulder 11/13/2014  . Fibromyalgia 10/02/2014  . Intercostal neuralgia 10/02/2014  . Facet syndrome, lumbar 10/02/2014  .  Sacroiliac joint disease 10/02/2014  . Carotid artery narrowing 07/09/2014  . Benign essential HTN 07/01/2014  . Osteoarthritis of thumb 09/24/2012  . Mixed hyperlipidemia 09/22/2012  . Arthritis of hand, degenerative 09/22/2012   Pura Spice, PT, DPT # [id number] Gwenlyn Saran, SPT 01/12/2018, 8:36 AM  [facility] [address] [place], [place], [address] Phone: [phone]   Fax:  [phone]  Name: Carrie Myers MRN: [id number] Date of Birth: 1945-05-23

## 2018-01-12 ENCOUNTER — Ambulatory Visit: Payer: Medicare Other | Admitting: Physical Therapy

## 2018-01-12 DIAGNOSIS — M5441 Lumbago with sciatica, right side: Principal | ICD-10-CM

## 2018-01-12 DIAGNOSIS — R2689 Other abnormalities of gait and mobility: Secondary | ICD-10-CM

## 2018-01-12 DIAGNOSIS — M6281 Muscle weakness (generalized): Secondary | ICD-10-CM

## 2018-01-12 DIAGNOSIS — R490 Dysphonia: Secondary | ICD-10-CM | POA: Diagnosis not present

## 2018-01-12 DIAGNOSIS — G8929 Other chronic pain: Secondary | ICD-10-CM | POA: Diagnosis not present

## 2018-01-12 NOTE — Therapy (Signed)
[facility] [address]. [place], [place], [address] Phone: [phone]   Fax:  [phone]  Physical Therapy Treatment  Patient Details  Name: Carrie Myers MRN: [id number] Date of Birth: 10/28/1945 Referring Provider (PT): Dr. Andree Elk   Encounter Date: 01/12/2018  PT End of Session - 01/12/18 1431    Visit Number  5    Number of Visits  11    Date for PT Re-Evaluation  02/07/18    PT Start Time  1427    Activity Tolerance  Patient limited by pain;Patient tolerated treatment well    Behavior During Therapy  [facility] for tasks assessed/performed       Past Medical History:  Diagnosis Date  . Allergy   . Anxiety   . Arthritis    neck, hands, lower back  . Bilateral dry eyes   . Chronic low back pain 12/23/2016  . DDD (degenerative disc disease), lumbar 10/02/2014  . Dental crowns present    Implants  . Fibromyalgia   . Fracture of right foot 01/14/2015  . GERD (gastroesophageal reflux disease)   . Hyperlipidemia   . Hypertension   . Kidney stones   . Myocardial infarction ([place]) 04/22/2006  . Osteoarthritis   . Other shoulder lesions, right shoulder 04/17/2015  . Rheumatoid arthritis ([place])   . Status post shoulder surgery 05/27/2015  . Thyroid disease     Past Surgical History:  Procedure Laterality Date  . ABDOMINAL HYSTERECTOMY     total  . APPENDECTOMY    . BREAST BIOPSY Right    neg  . BREAST CYST ASPIRATION Left    neg  . BUNIONECTOMY Bilateral   . CESAREAN SECTION    . COLONOSCOPY  2010   normal  . CORONARY ANGIOPLASTY WITH STENT PLACEMENT  04/2006  . dental implant     seven  . epidural steroid injection  2018  . SHOULDER ARTHROSCOPY Right 05/07/2015   Procedure: Extensive arthroscopic debridement and arthroscopic subacromial decompression, right shoulder.  2. Steroid injection right thumb CMC joint.;  Surgeon: Corky Mull, MD;  Location: [place];  Service: Orthopedics;   Laterality: Right;  . TOOTH EXTRACTION     Upper centrals and lateral    There were no vitals filed for this visit.  Subjective Assessment - 01/12/18 1429    Subjective  Pt. reports she has been walking more frequently with little increase in pain.  Pt. notes she woke up early this morning with pain in legs.    How long can you stand comfortably?  <10 min    How long can you walk comfortably?  5 min    Patient Stated Goals  Be able to wash dishes/ cook without increase in pain, become more active in gym.    Currently in Pain?  Yes    Pain Score  3     Pain Location  Back    Pain Orientation  Lower    Pain Descriptors / Indicators  Constant;Pressure;Sharp;Shooting    Pain Type  Chronic pain    Pain Onset  More than a month ago       Treatment  Therapeutic Exercise: Lateral walking, GTB above knees, // bars x5 laps Stationary marching, GTB above knees, x30 Standing B hip aBduction, GTB above knees,  x15 Standing B hip extension, GTB above knees, x15 B Hip hike on step, x20 Blue physioball: dead bug x20  Supine double knee to chest, green physioball, x20 Hooklying trunk  rotations, green physioball, x10 each side Hip bridge w/ hip adduction ball block, x15 Hip adduction ball squeeze 15x 5 sec Hip bridge w/ SL eccentric lower Bx10   Pt. educated on importance of PT and HEP going forward and was provided with her new HEP to include more core stabilization exercises.  Manual Therapy:  Supine B Distal Hamstring Stretch 4x30 sec each Supine B Proximal Hamstring Stretch 4x30 sec each Supine B Piriformis Stretch8x30 sec each Supine B Figure-4 Stretch 4x30 sec each Supine B IR/ER Stretch 4x30 sec each Supine Double Knee-to-Chest 4x30 sec Seated STM to UT, Cervical Region   Unbilled Interventions MHP during manual therapy   PT Education - 01/12/18 1519    Education Details  Given new HEP    Person(s) Educated  Patient    Methods  Explanation;Handout    Comprehension   Verbalized understanding       PT Short Term Goals - 01/12/18 0832      PT SHORT TERM GOAL #1   Title  Pt will be independent with HEP in order to improve strength and decrease back pain in order to improve pain-free function at home and work.     Baseline  Pt. understands importance of HEP    Period  Weeks    Status  Achieved    Target Date  01/10/18        PT Long Term Goals - 01/10/18 1541      PT LONG TERM GOAL #1   Title  Pt. will complete FOTO and improve to 55 to improve daily functional mobility.    Baseline  9/24 FOTO: 40; 10/29 FOTO: 47    Time  4    Period  Weeks    Status  Partially Met    Target Date  02/07/18      PT LONG TERM GOAL #2   Title  Pt will decrease worst back pain as reported on NPRS by at least 2 points in order to demonstrate clinically significant reduction in back pain.     Baseline  9/24 Worst Pain: 8/10; 01/10/18 Worst Pain: 8/10    Time  4    Period  Weeks    Status  Partially Met    Target Date  02/07/18      PT LONG TERM GOAL #3   Title  Pt will increase strength of B hip by at least 1/2 MMT grade in order to demonstrate improvement in strength and function.    Baseline  9/24 Hip Flexion R/L: 4/4    Time  4    Period  Weeks    Status  Partially Met    Target Date  02/07/18      PT LONG TERM GOAL #4   Title  Pt. will improve posture to enable her to cook full kitchen meal without any increase in pain.    Baseline  Pt. currently reports pain immediately after bending over.    Time  4    Period  Weeks    Status  New    Target Date  02/07/18            Plan - 01/12/18 1512    Clinical Impression Statement  Pt. presents to clinic in good spirits and amenable to therapy. Patient demonstrates increasing strength and endurance of hip musculature as evidenced by her ability to perform therapeutic exercises in standing against resistance for >10 minutes. Patient participated in core stabilization exercises but requires moderate VCs to  properly  sequence and engage abdominals during activities. Patient will continue to beenfit from skilled therapeutic intervention to address remaining deficits in strength, mobility, and activity tolerance in order to improve overall QOL.    Clinical Presentation  Evolving    Clinical Decision Making  Moderate    Rehab Potential  Fair    PT Frequency  2x / week    PT Duration  4 weeks    PT Treatment/Interventions  Aquatic Therapy;Cryotherapy;Electrical Stimulation;Moist Heat;Traction;Gait Scientist, forensic;Therapeutic activities;Therapeutic exercise;Manual techniques;Patient/family education;Neuromuscular re-education;Balance training;Passive range of motion;Dry needling;Splinting;Taping;Spinal Manipulations;Joint Manipulations    PT Next Visit Plan  new HEP, addressing core.    PT Home Exercise Plan  see handouts    Consulted and Agree with Plan of Care  Patient       Patient will benefit from skilled therapeutic intervention in order to improve the following deficits and impairments:  Decreased endurance, Hypomobility, Decreased activity tolerance, Decreased strength, Pain, Difficulty walking, Decreased mobility, Decreased range of motion, Impaired flexibility  Visit Diagnosis: Chronic bilateral low back pain with right-sided sciatica  Muscle weakness (generalized)  Other abnormalities of gait and mobility     Problem List Patient Active Problem List   Diagnosis Date Noted  . Tobacco use disorder, moderate, in sustained remission 12/22/2017  . Moderate episode of recurrent major depressive disorder ([place]) 10/03/2017  . Inflammatory spondylopathy of lumbar region ([place]) 10/03/2017  . Hoarseness, persistent 04/18/2017  . OSA on CPAP 02/18/2017  . Cervical disc disease 12/31/2016  . Generalized osteoarthritis 12/23/2016  . Chronic pain syndrome 12/23/2016  . Cystocele, midline 12/06/2016  . Urinary incontinence 12/06/2016  . Impingement syndrome of right shoulder 04/17/2015   . Fibrocystic breast 01/18/2015  . Gastroesophageal reflux disease without esophagitis 01/18/2015  . Hypothyroidism, postablative 01/18/2015  . Arteriosclerosis of coronary artery 12/29/2014  . DJD of shoulder 11/13/2014  . Fibromyalgia 10/02/2014  . Intercostal neuralgia 10/02/2014  . Facet syndrome, lumbar 10/02/2014  . Sacroiliac joint disease 10/02/2014  . Carotid artery narrowing 07/09/2014  . Benign essential HTN 07/01/2014  . Osteoarthritis of thumb 09/24/2012  . Mixed hyperlipidemia 09/22/2012  . Arthritis of hand, degenerative 09/22/2012    Gwenlyn Saran, SPT  This entire session was performed under direct supervision and direction of a licensed therapist/therapist assistant . I have personally read, edited and approve of the note as written.  Myles Gip PT, DPT [phone] 01/12/2018, 3:27 PM  [place] [facility] [address] [place], [place], [address] Phone: [phone]   Fax:  [phone]  Name: CLESSIE KARRAS MRN: [id number] Date of Birth: [birth year]

## 2018-01-12 NOTE — Patient Instructions (Signed)
Access Code: [email or web address]  URL: [email or web address]  Date: 01/12/2018  Prepared by: [place]   Exercises  Supine Bridge - 10 reps - 3 sets - 1x daily - 7x weekly  Supine Active Straight Leg Raise - 10 reps - 3 sets - 1x daily - 7x weekly  Sidelying Hip Abduction - 10 reps - 3 sets - 1x daily - 7x weekly  Supine Hip Adduction Isometric with Ball - 10 reps - 3 sets - 1x daily - 7x weekly  Alternating Single Leg Bridge - 10 reps - 3 sets - 1x daily - 7x weekly

## 2018-01-16 ENCOUNTER — Other Ambulatory Visit: Payer: Self-pay | Admitting: Internal Medicine

## 2018-01-16 DIAGNOSIS — Z1231 Encounter for screening mammogram for malignant neoplasm of breast: Secondary | ICD-10-CM

## 2018-01-17 ENCOUNTER — Ambulatory Visit: Payer: Medicare Other | Attending: Anesthesiology | Admitting: Physical Therapy

## 2018-01-17 ENCOUNTER — Encounter: Payer: Self-pay | Admitting: Physical Therapy

## 2018-01-17 DIAGNOSIS — M6281 Muscle weakness (generalized): Secondary | ICD-10-CM | POA: Diagnosis not present

## 2018-01-17 DIAGNOSIS — M5441 Lumbago with sciatica, right side: Secondary | ICD-10-CM | POA: Insufficient documentation

## 2018-01-17 DIAGNOSIS — R2689 Other abnormalities of gait and mobility: Secondary | ICD-10-CM | POA: Insufficient documentation

## 2018-01-17 DIAGNOSIS — G8929 Other chronic pain: Secondary | ICD-10-CM | POA: Insufficient documentation

## 2018-01-17 NOTE — Therapy (Signed)
[facility] [address] [place], [place], [address] Phone: [phone]   Fax:  [phone]  Physical Therapy Treatment  Patient Details  Name: Carrie Myers MRN: [id number] Date of Birth: 29-Nov-1945 Referring Provider (PT): Dr. Andree Elk   Encounter Date: 01/17/2018  PT End of Session - 01/17/18 1311    Visit Number  6    Number of Visits  11    Date for PT Re-Evaluation  02/07/18    PT Start Time  9150    PT Stop Time  5697    PT Time Calculation (min)  60 min    Activity Tolerance  Patient limited by pain;Patient tolerated treatment well    Behavior During Therapy  [facility] for tasks assessed/performed       Past Medical History:  Diagnosis Date  . Allergy   . Anxiety   . Arthritis    neck, hands, lower back  . Bilateral dry eyes   . Chronic low back pain 12/23/2016  . DDD (degenerative disc disease), lumbar 10/02/2014  . Dental crowns present    Implants  . Fibromyalgia   . Fracture of right foot 01/14/2015  . GERD (gastroesophageal reflux disease)   . Hyperlipidemia   . Hypertension   . Kidney stones   . Myocardial infarction ([place]) 04/22/2006  . Osteoarthritis   . Other shoulder lesions, right shoulder 04/17/2015  . Rheumatoid arthritis ([place])   . Status post shoulder surgery 05/27/2015  . Thyroid disease     Past Surgical History:  Procedure Laterality Date  . ABDOMINAL HYSTERECTOMY     total  . APPENDECTOMY    . BREAST BIOPSY Right    neg  . BREAST CYST ASPIRATION Left    neg  . BUNIONECTOMY Bilateral   . CESAREAN SECTION    . COLONOSCOPY  2010   normal  . CORONARY ANGIOPLASTY WITH STENT PLACEMENT  04/2006  . dental implant     [age]  . epidural steroid injection  2018  . SHOULDER ARTHROSCOPY Right 05/07/2015   Procedure: Extensive arthroscopic debridement and arthroscopic subacromial decompression, right shoulder.  2. Steroid injection right thumb CMC joint.;  Surgeon: Corky Mull,  MD;  Location: [place];  Service: Orthopedics;  Laterality: Right;  . TOOTH EXTRACTION     Upper centrals and lateral    There were no vitals filed for this visit.  Subjective Assessment - 01/17/18 1309    Subjective  Pt. is accompanied by her two grandkchildren today.  Pt. walks into the clinic with improved gait pattern.    Patient is accompained by:  Family member    How long can you stand comfortably?  <10 min    How long can you walk comfortably?  5 min    Patient Stated Goals  Be able to wash dishes/ cook without increase in pain, become more active in gym.    Currently in Pain?  Yes    Pain Score  3     Pain Location  Back    Pain Orientation  Lower    Pain Descriptors / Indicators  Constant;Pressure;Sharp;Shooting    Pain Type  Chronic pain    Pain Onset  More than a month ago    Pain Frequency  Constant          Treatment   Therapeutic Exercise:  Lateral walking, GTB above knees, // bars x5 laps Stationary marching, GTB above knees, x30 Standing B hip aBduction, GTB above knees,  x15 Standing B hip extension, GTB above knees, x15 Supine double knee to chest, green physioball, x20 Hooklying trunk rotations, green physioball, x10 each side Hip bridge w/ hip adduction ball block, x15 Hip adduction ball squeeze 15x 5 sec Hip bridge w/ SL eccentric lower Bx15 Hip bridge w/ SL eccentric lower on green therapy ball, x10 TG knee flexion x5 (midline)    Manual Therapy:  Supine B Distal Hamstring Stretch 4x30 sec each Supine B Proximal Hamstring Stretch 4x30 sec each Supine B Piriformis Stretch8x30 sec each Supine B Figure-4 Stretch 4x30 sec each Supine B IR/ER Stretch 4x30 sec each Supine Double Knee-to-Chest 4x30 sec    Unbilled Interventions  MHP during manual therapy     PT Short Term Goals - 01/12/18 9528      PT SHORT TERM GOAL #1   Title  Pt will be independent with HEP in order to improve strength and decrease back pain in  order to improve pain-free function at home and work.     Baseline  Pt. understands importance of HEP    Period  Weeks    Status  Achieved    Target Date  01/10/18        PT Long Term Goals - 01/10/18 1541      PT LONG TERM GOAL #1   Title  Pt. will complete FOTO and improve to 55 to improve daily functional mobility.    Baseline  9/24 FOTO: 40; 10/29 FOTO: 47    Time  4    Period  Weeks    Status  Partially Met    Target Date  02/07/18      PT LONG TERM GOAL #2   Title  Pt will decrease worst back pain as reported on NPRS by at least 2 points in order to demonstrate clinically significant reduction in back pain.     Baseline  9/24 Worst Pain: 8/10; 01/10/18 Worst Pain: 8/10    Time  4    Period  Weeks    Status  Partially Met    Target Date  02/07/18      PT LONG TERM GOAL #3   Title  Pt will increase strength of B hip by at least 1/2 MMT grade in order to demonstrate improvement in strength and function.    Baseline  9/24 Hip Flexion R/L: 4/4    Time  4    Period  Weeks    Status  Partially Met    Target Date  02/07/18      PT LONG TERM GOAL #4   Title  Pt. will improve posture to enable her to cook full kitchen meal without any increase in pain.    Baseline  Pt. currently reports pain immediately after bending over.    Time  4    Period  Weeks    Status  New    Target Date  02/07/18            Plan - 01/17/18 1406    Clinical Impression Statement  Pt. continues to improve in strength and is able to perform more bouts of exercises with less fatigue and decreased report of pain.  Pt. was able to progress to using the total gym for squats today and was proud of accomplishment.  Pt. will continue with cominaiton of manual therapy and strengthening program in order to decrease pain and improve stability of low back.    Clinical Presentation  Evolving    Clinical Decision Making  Moderate    Rehab Potential  Fair    PT Frequency  2x / week    PT Duration  4 weeks     PT Treatment/Interventions  Aquatic Therapy;Cryotherapy;Electrical Stimulation;Moist Heat;Traction;Gait Scientist, forensic;Therapeutic activities;Therapeutic exercise;Manual techniques;Patient/family education;Neuromuscular re-education;Balance training;Passive range of motion;Dry needling;Splinting;Taping;Spinal Manipulations;Joint Manipulations    PT Next Visit Plan  Assess HEP    PT Home Exercise Plan  see handouts    Consulted and Agree with Plan of Care  Patient       Patient will benefit from skilled therapeutic intervention in order to improve the following deficits and impairments:  Decreased endurance, Hypomobility, Decreased activity tolerance, Decreased strength, Pain, Difficulty walking, Decreased mobility, Decreased range of motion, Impaired flexibility  Visit Diagnosis: Chronic bilateral low back pain with right-sided sciatica  Muscle weakness (generalized)  Other abnormalities of gait and mobility     Problem List Patient Active Problem List   Diagnosis Date Noted  . Tobacco use disorder, moderate, in sustained remission 12/22/2017  . Moderate episode of recurrent major depressive disorder ([place]) 10/03/2017  . Inflammatory spondylopathy of lumbar region ([place]) 10/03/2017  . Hoarseness, persistent 04/18/2017  . OSA on CPAP 02/18/2017  . Cervical disc disease 12/31/2016  . Generalized osteoarthritis 12/23/2016  . Chronic pain syndrome 12/23/2016  . Cystocele, midline 12/06/2016  . Urinary incontinence 12/06/2016  . Impingement syndrome of right shoulder 04/17/2015  . Fibrocystic breast 01/18/2015  . Gastroesophageal reflux disease without esophagitis 01/18/2015  . Hypothyroidism, postablative 01/18/2015  . Arteriosclerosis of coronary artery 12/29/2014  . DJD of shoulder 11/13/2014  . Fibromyalgia 10/02/2014  . Intercostal neuralgia 10/02/2014  . Facet syndrome, lumbar 10/02/2014  . Sacroiliac joint disease 10/02/2014  . Carotid artery narrowing 07/09/2014   . Benign essential HTN 07/01/2014  . Osteoarthritis of thumb 09/24/2012  . Mixed hyperlipidemia 09/22/2012  . Arthritis of hand, degenerative 09/22/2012   Pura Spice, PT, DPT # [id number] Gwenlyn Saran, SPT 01/17/2018, 5:17 PM  [facility] [address] [place], [place], [address] Phone: [phone]   Fax:  [phone]  Name: Carrie Myers MRN: [id number] Date of Birth: 1945/04/09

## 2018-01-19 ENCOUNTER — Encounter: Payer: Self-pay | Admitting: Physical Therapy

## 2018-01-19 ENCOUNTER — Inpatient Hospital Stay: Payer: Medicare Other | Attending: Oncology | Admitting: Oncology

## 2018-01-19 ENCOUNTER — Ambulatory Visit
Admission: RE | Admit: 2018-01-19 | Discharge: 2018-01-19 | Disposition: A | Discharge status: Home / Self Care | Payer: Medicare Other | Source: Ambulatory Visit | Attending: Oncology | Admitting: Oncology

## 2018-01-19 ENCOUNTER — Ambulatory Visit: Payer: Medicare Other | Admitting: Physical Therapy

## 2018-01-19 DIAGNOSIS — M5441 Lumbago with sciatica, right side: Secondary | ICD-10-CM | POA: Diagnosis not present

## 2018-01-19 DIAGNOSIS — Z87891 Personal history of nicotine dependence: Secondary | ICD-10-CM | POA: Insufficient documentation

## 2018-01-19 DIAGNOSIS — G8929 Other chronic pain: Secondary | ICD-10-CM | POA: Diagnosis not present

## 2018-01-19 DIAGNOSIS — Z122 Encounter for screening for malignant neoplasm of respiratory organs: Secondary | ICD-10-CM | POA: Diagnosis not present

## 2018-01-19 DIAGNOSIS — R2689 Other abnormalities of gait and mobility: Secondary | ICD-10-CM | POA: Diagnosis not present

## 2018-01-19 DIAGNOSIS — K760 Fatty (change of) liver, not elsewhere classified: Secondary | ICD-10-CM | POA: Insufficient documentation

## 2018-01-19 DIAGNOSIS — I2510 Atherosclerotic heart disease of native coronary artery without angina pectoris: Secondary | ICD-10-CM | POA: Diagnosis not present

## 2018-01-19 DIAGNOSIS — M6281 Muscle weakness (generalized): Secondary | ICD-10-CM

## 2018-01-19 DIAGNOSIS — J439 Emphysema, unspecified: Secondary | ICD-10-CM | POA: Insufficient documentation

## 2018-01-19 NOTE — Progress Notes (Signed)
In accordance with CMS guidelines, patient has met eligibility criteria including age, absence of signs or symptoms of lung cancer.  Social History   Tobacco Use  . Smoking status: Former Smoker    Packs/day: 1.25    Years: 44.00    Pack years: 55.00    Types: Cigarettes    Last attempt to quit: 03/15/2006    Years since quitting: 11.8  . Smokeless tobacco: Former Systems developer    Quit date: 04/22/2006  . Tobacco comment: quit 8 years  Substance Use Topics  . Alcohol use: No    Alcohol/week: 0.0 standard drinks  . Drug use: No     A shared decision-making session was conducted prior to the performance of CT scan. This includes one or more decision aids, includes benefits and harms of screening, follow-up diagnostic testing, over-diagnosis, false positive rate, and total radiation exposure.  Counseling on the importance of adherence to annual lung cancer LDCT screening, impact of co-morbidities, and ability or willingness to undergo diagnosis and treatment is imperative for compliance of the program.  Counseling on the importance of continued smoking cessation for former smokers; the importance of smoking cessation for current smokers, and information about tobacco cessation interventions have been given to patient including [place] and 1800 quit [place] programs.  Written order for lung cancer screening with LDCT has been given to the patient and any and all questions have been answered to the best of my abilities.   Yearly follow up will be coordinated by Burgess Estelle, Thoracic Navigator.  Faythe Casa, NP 01/19/2018 2:55 PM

## 2018-01-19 NOTE — Therapy (Signed)
[facility] [address]. [place], [place], [address] Phone: [phone]   Fax:  [phone]  Physical Therapy Treatment  Patient Details  Name: Carrie Myers MRN: [id number] Date of Birth: 05-11-1945 Referring Provider (PT): Dr. Andree Elk   Encounter Date: 01/19/2018  PT End of Session - 01/19/18 1452    Visit Number  7    Number of Visits  11    Date for PT Re-Evaluation  02/07/18    PT Start Time  0958    PT Stop Time  1049    PT Time Calculation (min)  51 min    Activity Tolerance  Patient limited by pain;Patient tolerated treatment well    Behavior During Therapy  [facility] for tasks assessed/performed       Past Medical History:  Diagnosis Date  . Allergy   . Anxiety   . Arthritis    neck, hands, lower back  . Bilateral dry eyes   . Chronic low back pain 12/23/2016  . DDD (degenerative disc disease), lumbar 10/02/2014  . Dental crowns present    Implants  . Fibromyalgia   . Fracture of right foot 01/14/2015  . GERD (gastroesophageal reflux disease)   . Hyperlipidemia   . Hypertension   . Kidney stones   . Myocardial infarction ([place]) 04/22/2006  . Osteoarthritis   . Other shoulder lesions, right shoulder 04/17/2015  . Rheumatoid arthritis ([place])   . Status post shoulder surgery 05/27/2015  . Thyroid disease     Past Surgical History:  Procedure Laterality Date  . ABDOMINAL HYSTERECTOMY     total  . APPENDECTOMY    . BREAST BIOPSY Right    neg  . BREAST CYST ASPIRATION Left    neg  . BUNIONECTOMY Bilateral   . CESAREAN SECTION    . COLONOSCOPY  2010   normal  . CORONARY ANGIOPLASTY WITH STENT PLACEMENT  04/2006  . dental implant     seven  . epidural steroid injection  2018  . SHOULDER ARTHROSCOPY Right 05/07/2015   Procedure: Extensive arthroscopic debridement and arthroscopic subacromial decompression, right shoulder.  2. Steroid injection right thumb CMC joint.;  Surgeon: Corky Mull,  MD;  Location: [place];  Service: Orthopedics;  Laterality: Right;  . TOOTH EXTRACTION     Upper centrals and lateral    There were no vitals filed for this visit.  Subjective Assessment - 01/19/18 1446    Subjective  Pt. reports no new complaints upon arrival today.  Pt. brings in an exercise rope system to be educated on how to perform exercises, and a fibromyalgia book educating therapist on where pt's. with fibromyalgia typically experience pain.    Patient is accompained by:  Family member    How long can you stand comfortably?  <10 min    How long can you walk comfortably?  5 min    Patient Stated Goals  Be able to wash dishes/ cook without increase in pain, become more active in gym.    Currently in Pain?  Yes    Pain Score  3     Pain Location  Back    Pain Orientation  Lower    Pain Descriptors / Indicators  Constant;Pressure;Sharp;Shooting    Pain Type  Chronic pain    Pain Onset  More than a month ago             Treatment:   There Ex:   All resisted exercises performed with  GTB  Resisted Standing B Hip Marches x15 each Resisted Standing B Hip Extension x15 each Resisted Standing B Hip Abduction x15 each Heel Raises in // bars, x15 Standing Lunges, x10 each side Lateral 6" step-ups, x15 each LE Forward Step-ups on 6" step, x15 each LE Hip Hikes on 6" step x10 each LE Supine Bridges 2x15 with focus on breathing and TrA activation Hamstring Curls with Green Therapy Ball, 2x15 Lateral Trunk Rotation with green therapy ball, 2x15   Pt. Reported slight weakness/nauseousness with lunges after performing one bout of 10, so it was ceased.          PT Short Term Goals - 01/12/18 3151      PT SHORT TERM GOAL #1   Title  Pt will be independent with HEP in order to improve strength and decrease back pain in order to improve pain-free function at home and work.     Baseline  Pt. understands importance of HEP    Period  Weeks    Status   Achieved    Target Date  01/10/18        PT Long Term Goals - 01/10/18 1541      PT LONG TERM GOAL #1   Title  Pt. will complete FOTO and improve to 55 to improve daily functional mobility.    Baseline  9/24 FOTO: 40; 10/29 FOTO: 47    Time  4    Period  Weeks    Status  Partially Met    Target Date  02/07/18      PT LONG TERM GOAL #2   Title  Pt will decrease worst back pain as reported on NPRS by at least 2 points in order to demonstrate clinically significant reduction in back pain.     Baseline  9/24 Worst Pain: 8/10; 01/10/18 Worst Pain: 8/10    Time  4    Period  Weeks    Status  Partially Met    Target Date  02/07/18      PT LONG TERM GOAL #3   Title  Pt will increase strength of B hip by at least 1/2 MMT grade in order to demonstrate improvement in strength and function.    Baseline  9/24 Hip Flexion R/L: 4/4    Time  4    Period  Weeks    Status  Partially Met    Target Date  02/07/18      PT LONG TERM GOAL #4   Title  Pt. will improve posture to enable her to cook full kitchen meal without any increase in pain.    Baseline  Pt. currently reports pain immediately after bending over.    Time  4    Period  Weeks    Status  New    Target Date  02/07/18            Plan - 01/19/18 1506    Clinical Impression Statement  Pt. performed well with ther ex today and was able to increase bouts of exercises along with decreased amount of rest breaks required between sets.  Pt. consistenyly improving in strength and making marked progress towards goals.  Pt. will continue to benefit from skilled therapy to address hip weaknessess and decrease pain in B hips/low back.    Clinical Presentation  Evolving    Clinical Decision Making  Moderate    Rehab Potential  Fair    PT Frequency  2x / week    PT Duration  4  weeks    PT Treatment/Interventions  Aquatic Therapy;Cryotherapy;Electrical Stimulation;Moist Heat;Traction;Gait Scientist, forensic;Therapeutic  activities;Therapeutic exercise;Manual techniques;Patient/family education;Neuromuscular re-education;Balance training;Passive range of motion;Dry needling;Splinting;Taping;Spinal Manipulations;Joint Manipulations    PT Next Visit Plan  Assess HEP    PT Home Exercise Plan  see handouts    Consulted and Agree with Plan of Care  Patient       Patient will benefit from skilled therapeutic intervention in order to improve the following deficits and impairments:  Decreased endurance, Hypomobility, Decreased activity tolerance, Decreased strength, Pain, Difficulty walking, Decreased mobility, Decreased range of motion, Impaired flexibility  Visit Diagnosis: Chronic bilateral low back pain with right-sided sciatica  Muscle weakness (generalized)  Other abnormalities of gait and mobility     Problem List Patient Active Problem List   Diagnosis Date Noted  . Tobacco use disorder, moderate, in sustained remission 12/22/2017  . Moderate episode of recurrent major depressive disorder ([place]) 10/03/2017  . Inflammatory spondylopathy of lumbar region ([place]) 10/03/2017  . Hoarseness, persistent 04/18/2017  . OSA on CPAP 02/18/2017  . Cervical disc disease 12/31/2016  . Generalized osteoarthritis 12/23/2016  . Chronic pain syndrome 12/23/2016  . Cystocele, midline 12/06/2016  . Urinary incontinence 12/06/2016  . Impingement syndrome of right shoulder 04/17/2015  . Fibrocystic breast 01/18/2015  . Gastroesophageal reflux disease without esophagitis 01/18/2015  . Hypothyroidism, postablative 01/18/2015  . Arteriosclerosis of coronary artery 12/29/2014  . DJD of shoulder 11/13/2014  . Fibromyalgia 10/02/2014  . Intercostal neuralgia 10/02/2014  . Facet syndrome, lumbar 10/02/2014  . Sacroiliac joint disease 10/02/2014  . Carotid artery narrowing 07/09/2014  . Benign essential HTN 07/01/2014  . Osteoarthritis of thumb 09/24/2012  . Mixed hyperlipidemia 09/22/2012  . Arthritis of hand,  degenerative 09/22/2012   Pura Spice, PT, DPT # [id number] Gwenlyn Saran, SPT 01/19/2018, 6:22 PM  [facility] [address] [place], [place], [address] Phone: [phone]   Fax:  [phone]  Name: Carrie Myers MRN: [id number] Date of Birth: 1946-02-25

## 2018-01-24 ENCOUNTER — Encounter: Payer: Self-pay | Admitting: *Deleted

## 2018-01-26 ENCOUNTER — Ambulatory Visit: Payer: Medicare Other | Admitting: Physical Therapy

## 2018-01-26 ENCOUNTER — Encounter: Payer: Self-pay | Admitting: Physical Therapy

## 2018-01-26 DIAGNOSIS — M6281 Muscle weakness (generalized): Secondary | ICD-10-CM | POA: Diagnosis not present

## 2018-01-26 DIAGNOSIS — R2689 Other abnormalities of gait and mobility: Secondary | ICD-10-CM

## 2018-01-26 DIAGNOSIS — M5441 Lumbago with sciatica, right side: Secondary | ICD-10-CM | POA: Diagnosis not present

## 2018-01-26 DIAGNOSIS — G8929 Other chronic pain: Secondary | ICD-10-CM | POA: Diagnosis not present

## 2018-01-26 NOTE — Therapy (Signed)
[facility] [address] [place], [place], [address] Phone: [phone]   Fax:  [phone]  Physical Therapy Treatment  Patient Details  Name: Carrie Myers MRN: [id number] Date of Birth: 14-Jan-1946 Referring Provider (PT): Dr. Andree Elk   Encounter Date: 01/26/2018  PT End of Session - 01/26/18 1305    Visit Number  8    Number of Visits  11    Date for PT Re-Evaluation  02/07/18    PT Start Time  5366    PT Stop Time  1438    PT Time Calculation (min)  55 min    Activity Tolerance  Patient limited by pain;Patient tolerated treatment well    Behavior During Therapy  [facility] for tasks assessed/performed       Past Medical History:  Diagnosis Date  . Allergy   . Anxiety   . Arthritis    neck, hands, lower back  . Bilateral dry eyes   . Chronic low back pain 12/23/2016  . DDD (degenerative disc disease), lumbar 10/02/2014  . Dental crowns present    Implants  . Fibromyalgia   . Fracture of right foot 01/14/2015  . GERD (gastroesophageal reflux disease)   . Hyperlipidemia   . Hypertension   . Kidney stones   . Myocardial infarction ([place]) 04/22/2006  . Osteoarthritis   . Other shoulder lesions, right shoulder 04/17/2015  . Rheumatoid arthritis ([place])   . Status post shoulder surgery 05/27/2015  . Thyroid disease     Past Surgical History:  Procedure Laterality Date  . ABDOMINAL HYSTERECTOMY     total  . APPENDECTOMY    . BREAST BIOPSY Right    neg  . BREAST CYST ASPIRATION Left    neg  . BUNIONECTOMY Bilateral   . CESAREAN SECTION    . COLONOSCOPY  2010   normal  . CORONARY ANGIOPLASTY WITH STENT PLACEMENT  04/2006  . dental implant     seven  . epidural steroid injection  2018  . SHOULDER ARTHROSCOPY Right 05/07/2015   Procedure: Extensive arthroscopic debridement and arthroscopic subacromial decompression, right shoulder.  2. Steroid injection right thumb CMC joint.;  Surgeon: Corky Mull,  MD;  Location: [place];  Service: Orthopedics;  Laterality: Right;  . TOOTH EXTRACTION     Upper centrals and lateral    There were no vitals filed for this visit.  Subjective Assessment - 01/26/18 1305    Subjective  Pt. states she is tired because she just completed several hours of shopping/ walking.      Patient is accompained by:  Family member    How long can you stand comfortably?  <10 min    How long can you walk comfortably?  5 min    Patient Stated Goals  Be able to wash dishes/ cook without increase in pain, become more active in gym.    Currently in Pain?  Yes    Pain Score  3     Pain Location  Back    Pain Orientation  Lower    Pain Descriptors / Indicators  Constant    Pain Type  Chronic pain          There Ex:  Scifit L5 8 min. (fatigue noted)- no increase c/o pain.    All resisted exercises performed with GTB  Resisted Standing B Hip Marches x15 each Resisted Standing B Hip Extension x15 each Resisted Standing B Hip Abduction x15 each Heel Raises in //  bars, x15 Lateral 6" step-ups, x15 each LE Forward Step-ups on 6" step, x15 each LE No lunges/ squats     Manual tx.:  Supine LE/lumbar generalized stretches 11 min. STM to B UT/ mid-thoracic region.  (as tolerated)   PT Short Term Goals - 01/12/18 3149      PT SHORT TERM GOAL #1   Title  Pt will be independent with HEP in order to improve strength and decrease back pain in order to improve pain-free function at home and work.     Baseline  Pt. understands importance of HEP    Period  Weeks    Status  Achieved    Target Date  01/10/18        PT Long Term Goals - 01/10/18 1541      PT LONG TERM GOAL #1   Title  Pt. will complete FOTO and improve to 55 to improve daily functional mobility.    Baseline  9/24 FOTO: 40; 10/29 FOTO: 47    Time  4    Period  Weeks    Status  Partially Met    Target Date  02/07/18      PT LONG TERM GOAL #2   Title  Pt will decrease worst back  pain as reported on NPRS by at least 2 points in order to demonstrate clinically significant reduction in back pain.     Baseline  9/24 Worst Pain: 8/10; 01/10/18 Worst Pain: 8/10    Time  4    Period  Weeks    Status  Partially Met    Target Date  02/07/18      PT LONG TERM GOAL #3   Title  Pt will increase strength of B hip by at least 1/2 MMT grade in order to demonstrate improvement in strength and function.    Baseline  9/24 Hip Flexion R/L: 4/4    Time  4    Period  Weeks    Status  Partially Met    Target Date  02/07/18      PT LONG TERM GOAL #4   Title  Pt. will improve posture to enable her to cook full kitchen meal without any increase in pain.    Baseline  Pt. currently reports pain immediately after bending over.    Time  4    Period  Weeks    Status  New    Target Date  02/07/18            Plan - 01/26/18 1305    Clinical Impression Statement  Good technique with standing ther.ex.  Pt. progressing well with generalized strengthening and educated on the importance of maintaining an active lifestyle to manage symptoms.  No change to HEP at this time.      Clinical Presentation  Evolving    Clinical Decision Making  Moderate    Rehab Potential  Fair    PT Frequency  2x / week    PT Duration  4 weeks    PT Treatment/Interventions  Aquatic Therapy;Cryotherapy;Electrical Stimulation;Moist Heat;Traction;Gait Scientist, forensic;Therapeutic activities;Therapeutic exercise;Manual techniques;Patient/family education;Neuromuscular re-education;Balance training;Passive range of motion;Dry needling;Splinting;Taping;Spinal Manipulations;Joint Manipulations    PT Next Visit Plan  Check goals next tx. session       Patient will benefit from skilled therapeutic intervention in order to improve the following deficits and impairments:  Decreased endurance, Hypomobility, Decreased activity tolerance, Decreased strength, Pain, Difficulty walking, Decreased mobility, Decreased  range of motion, Impaired flexibility  Visit Diagnosis: Chronic bilateral low  back pain with right-sided sciatica  Muscle weakness (generalized)  Other abnormalities of gait and mobility     Problem List Patient Active Problem List   Diagnosis Date Noted  . Tobacco use disorder, moderate, in sustained remission 12/22/2017  . Moderate episode of recurrent major depressive disorder ([place]) 10/03/2017  . Inflammatory spondylopathy of lumbar region ([place]) 10/03/2017  . Hoarseness, persistent 04/18/2017  . OSA on CPAP 02/18/2017  . Cervical disc disease 12/31/2016  . Generalized osteoarthritis 12/23/2016  . Chronic pain syndrome 12/23/2016  . Cystocele, midline 12/06/2016  . Urinary incontinence 12/06/2016  . Impingement syndrome of right shoulder 04/17/2015  . Fibrocystic breast 01/18/2015  . Gastroesophageal reflux disease without esophagitis 01/18/2015  . Hypothyroidism, postablative 01/18/2015  . Arteriosclerosis of coronary artery 12/29/2014  . DJD of shoulder 11/13/2014  . Fibromyalgia 10/02/2014  . Intercostal neuralgia 10/02/2014  . Facet syndrome, lumbar 10/02/2014  . Sacroiliac joint disease 10/02/2014  . Carotid artery narrowing 07/09/2014  . Benign essential HTN 07/01/2014  . Osteoarthritis of thumb 09/24/2012  . Mixed hyperlipidemia 09/22/2012  . Arthritis of hand, degenerative 09/22/2012   Pura Spice, PT, DPT # [id number] 01/27/2018, 1:38 PM  [facility] [address] [place], [place], [address] Phone: [phone]   Fax:  [phone]  Name: Carrie Myers MRN: [id number] Date of Birth: 10/13/1945

## 2018-01-27 ENCOUNTER — Other Ambulatory Visit: Payer: Self-pay | Admitting: Internal Medicine

## 2018-01-27 DIAGNOSIS — F331 Major depressive disorder, recurrent, moderate: Secondary | ICD-10-CM

## 2018-01-31 ENCOUNTER — Encounter: Payer: Self-pay | Admitting: Physical Therapy

## 2018-01-31 ENCOUNTER — Ambulatory Visit: Payer: Medicare Other | Admitting: Physical Therapy

## 2018-01-31 DIAGNOSIS — R2689 Other abnormalities of gait and mobility: Secondary | ICD-10-CM | POA: Diagnosis not present

## 2018-01-31 DIAGNOSIS — M6281 Muscle weakness (generalized): Secondary | ICD-10-CM | POA: Diagnosis not present

## 2018-01-31 DIAGNOSIS — G8929 Other chronic pain: Secondary | ICD-10-CM | POA: Diagnosis not present

## 2018-01-31 DIAGNOSIS — M5441 Lumbago with sciatica, right side: Principal | ICD-10-CM

## 2018-02-01 NOTE — Therapy (Signed)
[facility] [address] [place], [place], [address] Phone: [phone]   Fax:  [phone]  Physical Therapy Treatment  Patient Details  Name: Carrie Myers MRN: [id number] Date of Birth: [birth year] Referring Provider (PT): Dr. Andree Elk   Encounter Date: 01/31/2018  PT End of Session - 02/01/18 1810    Visit Number  9    Number of Visits  11    Date for PT Re-Evaluation  02/07/18    PT Start Time  1003    PT Stop Time  1100    PT Time Calculation (min)  57 min    Activity Tolerance  Patient limited by pain;Patient tolerated treatment well    Behavior During Therapy  [facility] for tasks assessed/performed       Past Medical History:  Diagnosis Date  . Allergy   . Anxiety   . Arthritis    neck, hands, lower back  . Bilateral dry eyes   . Chronic low back pain 12/23/2016  . DDD (degenerative disc disease), lumbar 10/02/2014  . Dental crowns present    Implants  . Fibromyalgia   . Fracture of right foot 01/14/2015  . GERD (gastroesophageal reflux disease)   . Hyperlipidemia   . Hypertension   . Kidney stones   . Myocardial infarction ([place]) 04/22/2006  . Osteoarthritis   . Other shoulder lesions, right shoulder 04/17/2015  . Rheumatoid arthritis ([place])   . Status post shoulder surgery 05/27/2015  . Thyroid disease     Past Surgical History:  Procedure Laterality Date  . ABDOMINAL HYSTERECTOMY     total  . APPENDECTOMY    . BREAST BIOPSY Right    neg  . BREAST CYST ASPIRATION Left    neg  . BUNIONECTOMY Bilateral   . CESAREAN SECTION    . COLONOSCOPY  2010   normal  . CORONARY ANGIOPLASTY WITH STENT PLACEMENT  04/2006  . dental implant     seven  . epidural steroid injection  2018  . SHOULDER ARTHROSCOPY Right 05/07/2015   Procedure: Extensive arthroscopic debridement and arthroscopic subacromial decompression, right shoulder.  2. Steroid injection right thumb CMC joint.;  Surgeon: Corky Mull,  MD;  Location: [place];  Service: Orthopedics;  Laterality: Right;  . TOOTH EXTRACTION     Upper centrals and lateral    There were no vitals filed for this visit.  Subjective Assessment - 02/01/18 1809    Subjective  Pt. states she is feeling good today.  Pt. reports no new complaints but is aware of her fibro symptoms over past couple days.      Patient Stated Goals  Be able to wash dishes/ cook without increase in pain, become more active in gym.    Currently in Pain?  Yes    Pain Score  2     Pain Location  Back    Pain Orientation  Lower    Pain Descriptors / Indicators  Aching;Constant    Pain Type  Chronic pain         There Ex:  Scifit L5 10 min. - no increase c/o pain.    Resisted Standing ex. With GTB:  B Hip Marches/ hip abd./ extension 10x2 each Heel Raises in // bars, x15 Lateral 6" step-ups, x15 each LE Forward Step-ups on 6" step, x15 each LE Toe/heel walking in //-bars.  Cuing for [employer]. [employer] balance beam forward/ lateral     Manual tx.:  Supine LE/lumbar  generalized stretches 10 min. STM to B UT/ mid-thoracic region.  (as tolerated)    PT Short Term Goals - 01/12/18 9326      PT SHORT TERM GOAL #1   Title  Pt will be independent with HEP in order to improve strength and decrease back pain in order to improve pain-free function at home and work.     Baseline  Pt. understands importance of HEP    Period  Weeks    Status  Achieved    Target Date  01/10/18        PT Long Term Goals - 01/10/18 1541      PT LONG TERM GOAL #1   Title  Pt. will complete FOTO and improve to 55 to improve daily functional mobility.    Baseline  9/24 FOTO: 40; 10/29 FOTO: 47    Time  4    Period  Weeks    Status  Partially Met    Target Date  02/07/18      PT LONG TERM GOAL #2   Title  Pt will decrease worst back pain as reported on NPRS by at least 2 points in order to demonstrate clinically significant reduction in back pain.      Baseline  9/24 Worst Pain: 8/10; 01/10/18 Worst Pain: 8/10    Time  4    Period  Weeks    Status  Partially Met    Target Date  02/07/18      PT LONG TERM GOAL #3   Title  Pt will increase strength of B hip by at least 1/2 MMT grade in order to demonstrate improvement in strength and function.    Baseline  9/24 Hip Flexion R/L: 4/4    Time  4    Period  Weeks    Status  Partially Met    Target Date  02/07/18      PT LONG TERM GOAL #4   Title  Pt. will improve posture to enable her to cook full kitchen meal without any increase in pain.    Baseline  Pt. currently reports pain immediately after bending over.    Time  4    Period  Weeks    Status  New    Target Date  02/07/18        Plan - 02/01/18 1811    Clinical Impression Statement  Pt. completed all aspect of ther.ex. wtih no increase c/o pain.  Good motivation and progression of LE strengthening ex./ repetitions.  PT will reassess goals and discuss POC next tx. session.      Clinical Presentation  Evolving    Clinical Decision Making  Moderate    Rehab Potential  Fair    PT Frequency  2x / week    PT Duration  4 weeks    PT Treatment/Interventions  Aquatic Therapy;Cryotherapy;Electrical Stimulation;Moist Heat;Traction;Gait Scientist, forensic;Therapeutic activities;Therapeutic exercise;Manual techniques;Patient/family education;Neuromuscular re-education;Balance training;Passive range of motion;Dry needling;Splinting;Taping;Spinal Manipulations;Joint Manipulations    PT Next Visit Plan  Check goals next tx. session    PT Home Exercise Plan  see handouts       Patient will benefit from skilled therapeutic intervention in order to improve the following deficits and impairments:  Decreased endurance, Hypomobility, Decreased activity tolerance, Decreased strength, Pain, Difficulty walking, Decreased mobility, Decreased range of motion, Impaired flexibility  Visit Diagnosis: Chronic bilateral low back pain with  right-sided sciatica  Muscle weakness (generalized)  Other abnormalities of gait and mobility     Problem List  Patient Active Problem List   Diagnosis Date Noted  . Tobacco use disorder, moderate, in sustained remission 12/22/2017  . Moderate episode of recurrent major depressive disorder ([place]) 10/03/2017  . Inflammatory spondylopathy of lumbar region ([place]) 10/03/2017  . Hoarseness, persistent 04/18/2017  . OSA on CPAP 02/18/2017  . Cervical disc disease 12/31/2016  . Generalized osteoarthritis 12/23/2016  . Chronic pain syndrome 12/23/2016  . Cystocele, midline 12/06/2016  . Urinary incontinence 12/06/2016  . Impingement syndrome of right shoulder 04/17/2015  . Fibrocystic breast 01/18/2015  . Gastroesophageal reflux disease without esophagitis 01/18/2015  . Hypothyroidism, postablative 01/18/2015  . Arteriosclerosis of coronary artery 12/29/2014  . DJD of shoulder 11/13/2014  . Fibromyalgia 10/02/2014  . Intercostal neuralgia 10/02/2014  . Facet syndrome, lumbar 10/02/2014  . Sacroiliac joint disease 10/02/2014  . Carotid artery narrowing 07/09/2014  . Benign essential HTN 07/01/2014  . Osteoarthritis of thumb 09/24/2012  . Mixed hyperlipidemia 09/22/2012  . Arthritis of hand, degenerative 09/22/2012   Pura Spice, PT, DPT # [phone] 02/01/2018, 6:14 PM  [facility] [address] [place], [place], [address] Phone: [phone]   Fax:  [phone]  Name: Carrie Myers MRN: [id number] Date of Birth: 1945/12/10

## 2018-02-02 ENCOUNTER — Ambulatory Visit: Payer: Medicare Other | Admitting: Physical Therapy

## 2018-02-05 ENCOUNTER — Telehealth: Payer: Self-pay | Admitting: Obstetrics & Gynecology

## 2018-02-06 ENCOUNTER — Other Ambulatory Visit: Payer: Self-pay | Admitting: Internal Medicine

## 2018-02-06 DIAGNOSIS — F331 Major depressive disorder, recurrent, moderate: Secondary | ICD-10-CM

## 2018-02-06 MED ORDER — DULOXETINE HCL 60 MG PO CPEP: 60.0000 mg | ORAL_CAPSULE | Freq: Every day | ORAL | 5 refills | Status: DC

## 2018-02-06 NOTE — Telephone Encounter (Signed)
Can you call pt to schedule apt with [facility]

## 2018-02-06 NOTE — Telephone Encounter (Signed)
Can you schedule appt with pt

## 2018-02-06 NOTE — Telephone Encounter (Signed)
Pt needs appt for her to continue w bladder meds.  Refill done for one month.  SAch annual or at least follow up w pt.

## 2018-02-06 NOTE — Telephone Encounter (Signed)
Called and spoke with Patient to schedule appointment. Patient is wanting to go off medication at this time and will call back for appointment in January. Please advise

## 2018-02-07 ENCOUNTER — Ambulatory Visit: Payer: Medicare Other | Admitting: Physical Therapy

## 2018-02-07 ENCOUNTER — Encounter: Payer: Self-pay | Admitting: Physical Therapy

## 2018-02-07 DIAGNOSIS — M5441 Lumbago with sciatica, right side: Principal | ICD-10-CM

## 2018-02-07 DIAGNOSIS — R2689 Other abnormalities of gait and mobility: Secondary | ICD-10-CM

## 2018-02-07 DIAGNOSIS — G8929 Other chronic pain: Secondary | ICD-10-CM

## 2018-02-07 DIAGNOSIS — M6281 Muscle weakness (generalized): Secondary | ICD-10-CM

## 2018-02-07 NOTE — Telephone Encounter (Signed)
It is OK for her to come off medicine and see how she does.  We can see her for yearly check up on this and other in January

## 2018-02-08 ENCOUNTER — Other Ambulatory Visit: Payer: Self-pay | Admitting: Internal Medicine

## 2018-02-10 NOTE — Therapy (Addendum)
[place] [facility] [address] [place], [place], [address] Phone: [phone]   Fax:  [phone]  Physical Therapy Treatment  Patient Details  Name: Carrie Myers MRN: [id number] Date of Birth: [birth year] Referring Provider (PT): Dr. Andree Elk   Encounter Date: 02/07/2018  PT End of Session - 03/12/18 1401    Visit Number  10    Number of Visits  11    Date for PT Re-Evaluation  02/07/18    PT Start Time  8416    PT Stop Time  1120    PT Time Calculation (min)  57 min    Activity Tolerance  Patient limited by pain;Patient tolerated treatment well    Behavior During Therapy  [facility] for tasks assessed/performed       Past Medical History:  Diagnosis Date  . Allergy   . Anxiety   . Arthritis    neck, hands, lower back  . Bilateral dry eyes   . Chronic low back pain 12/23/2016  . DDD (degenerative disc disease), lumbar 10/02/2014  . Dental crowns present    Implants  . Fibromyalgia   . Fracture of right foot 01/14/2015  . GERD (gastroesophageal reflux disease)   . Hyperlipidemia   . Hypertension   . Kidney stones   . Myocardial infarction ([place]) 04/22/2006  . Osteoarthritis   . Other shoulder lesions, right shoulder 04/17/2015  . Rheumatoid arthritis ([place])   . Status post shoulder surgery 05/27/2015  . Thyroid disease     Past Surgical History:  Procedure Laterality Date  . ABDOMINAL HYSTERECTOMY     total  . APPENDECTOMY    . BREAST BIOPSY Right    neg  . BREAST CYST ASPIRATION Left    neg  . BUNIONECTOMY Bilateral   . CESAREAN SECTION    . COLONOSCOPY  2010   normal  . CORONARY ANGIOPLASTY WITH STENT PLACEMENT  04/2006  . dental implant     [age]  . epidural steroid injection  2018  . SHOULDER ARTHROSCOPY Right 05/07/2015   Procedure: Extensive arthroscopic debridement and arthroscopic subacromial decompression, right shoulder.  2. Steroid injection right thumb CMC joint.;  Surgeon: Corky Mull,  MD;  Location: [place];  Service: Orthopedics;  Laterality: Right;  . TOOTH EXTRACTION     Upper centrals and lateral    There were no vitals filed for this visit.    Pt. reports no new complaints. Pt. discussed her fibro in detail and understands that she will always have chronic pain. PT tried to educate pt. on importance of ther.ex./ gym based ex. to manage symptoms.          There Ex:  Scifit L5 10 min. - no increase c/o pain. Resisted Standing ex. With GTB:  B Hip Marches/ hip abd./ extension 10x2 each Core based ex. (see handouts)- progressive ex. Program.  Minimal cuing required for proper technique. Discussed gym based ex.   Manual tx.:  Supine LE/lumbar generalized stretches 7 min. STM to B UT/ mid-thoracic region. (as tolerated)    PT Short Term Goals - 01/12/18 6063      PT SHORT TERM GOAL #1   Title  Pt will be independent with HEP in order to improve strength and decrease back pain in order to improve pain-free function at home and work.     Baseline  Pt. understands importance of HEP    Period  Weeks    Status  Achieved    Target  Date  01/10/18        PT Long Term Goals - 02/07/18 1105      PT LONG TERM GOAL #1   Title  Pt. will complete FOTO and improve to 55 to improve daily functional mobility.    Baseline  9/24 FOTO: 40; 10/29 FOTO: 47    Time  4    Period  Weeks    Status  Partially Met    Target Date  02/07/18      PT LONG TERM GOAL #2   Baseline  9/24 Worst Pain: 8/10; 01/10/18 Worst Pain: 8/10    Time  4    Period  Weeks    Status  Partially Met    Target Date  02/07/18      PT LONG TERM GOAL #3   Title  Pt will increase strength of B hip by at least 1/2 MMT grade in order to demonstrate improvement in strength and function.    Baseline  9/24 Hip Flexion R/L: 4/4.  11/26: B hip flexion 4+/5 MMT (marked improvement).    Time  4    Period  Weeks    Status  Achieved    Target Date  02/07/18      PT LONG TERM  GOAL #4   Title  Pt. will improve posture to enable her to cook full kitchen meal without any increase in pain.    Baseline  Increase c/o pain with varying position changes.     Time  4    Period  Weeks    Status  Partially Met    Target Date  02/07/18         Plan - 03/12/18 1402    Clinical Impression Statement  Pt. has shown progress and greater understanding of POC with ther.ex.  Pt. is at max physical potential with skilled PT services and will continue on an independent basis at this time.  See goals.  Discharge from PT at this time.      Clinical Presentation  Evolving    Clinical Decision Making  Moderate    Rehab Potential  Fair    PT Frequency  2x / week    PT Duration  4 weeks    PT Treatment/Interventions  Aquatic Therapy;Cryotherapy;Electrical Stimulation;Moist Heat;Traction;Gait Scientist, forensic;Therapeutic activities;Therapeutic exercise;Manual techniques;Patient/family education;Neuromuscular re-education;Balance training;Passive range of motion;Dry needling;Splinting;Taping;Spinal Manipulations;Joint Manipulations    PT Next Visit Plan  Discharge    PT Home Exercise Plan  see handouts       Patient will benefit from skilled therapeutic intervention in order to improve the following deficits and impairments:  Decreased endurance, Hypomobility, Decreased activity tolerance, Decreased strength, Pain, Difficulty walking, Decreased mobility, Decreased range of motion, Impaired flexibility  Visit Diagnosis: Chronic bilateral low back pain with right-sided sciatica  Muscle weakness (generalized)  Other abnormalities of gait and mobility     Problem List Patient Active Problem List   Diagnosis Date Noted  . Tobacco use disorder, moderate, in sustained remission 12/22/2017  . Moderate episode of recurrent major depressive disorder ([place]) 10/03/2017  . Inflammatory spondylopathy of lumbar region ([place]) 10/03/2017  . Hoarseness, persistent 04/18/2017  . OSA on  CPAP 02/18/2017  . Cervical disc disease 12/31/2016  . Generalized osteoarthritis 12/23/2016  . Chronic pain syndrome 12/23/2016  . Cystocele, midline 12/06/2016  . Urinary incontinence 12/06/2016  . Impingement syndrome of right shoulder 04/17/2015  . Fibrocystic breast 01/18/2015  . Gastroesophageal reflux disease without esophagitis 01/18/2015  . Hypothyroidism, postablative 01/18/2015  .  Arteriosclerosis of coronary artery 12/29/2014  . DJD of shoulder 11/13/2014  . Fibromyalgia 10/02/2014  . Intercostal neuralgia 10/02/2014  . Facet syndrome, lumbar 10/02/2014  . Sacroiliac joint disease 10/02/2014  . Carotid artery narrowing 07/09/2014  . Benign essential HTN 07/01/2014  . Osteoarthritis of thumb 09/24/2012  . Mixed hyperlipidemia 09/22/2012  . Arthritis of hand, degenerative 09/22/2012   Pura Spice, PT, DPT # [phone] 03/12/2018, 2:05 PM  [place] [facility] [address] [place], [place], [address] Phone: [phone]   Fax:  [phone]  Name: Carrie Myers MRN: [id number] Date of Birth: 1945-09-20

## 2018-02-17 ENCOUNTER — Other Ambulatory Visit: Payer: Self-pay | Admitting: Internal Medicine

## 2018-02-17 DIAGNOSIS — M797 Fibromyalgia: Secondary | ICD-10-CM

## 2018-03-14 ENCOUNTER — Ambulatory Visit: Payer: Medicare Other | Attending: Anesthesiology | Admitting: Anesthesiology

## 2018-03-14 ENCOUNTER — Encounter: Payer: Self-pay | Admitting: Anesthesiology

## 2018-03-14 ENCOUNTER — Other Ambulatory Visit: Payer: Self-pay

## 2018-03-14 VITALS — BP 150/80 | HR 92 | Temp 98.2°F | Resp 16 | Ht 68.0 in | Wt 182.0 lb

## 2018-03-14 DIAGNOSIS — M47812 Spondylosis without myelopathy or radiculopathy, cervical region: Secondary | ICD-10-CM

## 2018-03-14 DIAGNOSIS — K760 Fatty (change of) liver, not elsewhere classified: Secondary | ICD-10-CM | POA: Diagnosis not present

## 2018-03-14 DIAGNOSIS — M5442 Lumbago with sciatica, left side: Secondary | ICD-10-CM | POA: Insufficient documentation

## 2018-03-14 DIAGNOSIS — Z7982 Long term (current) use of aspirin: Secondary | ICD-10-CM | POA: Insufficient documentation

## 2018-03-14 DIAGNOSIS — M5441 Lumbago with sciatica, right side: Secondary | ICD-10-CM | POA: Diagnosis not present

## 2018-03-14 DIAGNOSIS — M797 Fibromyalgia: Secondary | ICD-10-CM | POA: Diagnosis not present

## 2018-03-14 DIAGNOSIS — M5136 Other intervertebral disc degeneration, lumbar region: Secondary | ICD-10-CM | POA: Diagnosis not present

## 2018-03-14 DIAGNOSIS — M159 Polyosteoarthritis, unspecified: Secondary | ICD-10-CM | POA: Diagnosis not present

## 2018-03-14 DIAGNOSIS — G8929 Other chronic pain: Secondary | ICD-10-CM

## 2018-03-14 DIAGNOSIS — I2510 Atherosclerotic heart disease of native coronary artery without angina pectoris: Secondary | ICD-10-CM | POA: Diagnosis not present

## 2018-03-14 DIAGNOSIS — Z79890 Hormone replacement therapy (postmenopausal): Secondary | ICD-10-CM | POA: Diagnosis not present

## 2018-03-14 DIAGNOSIS — M533 Sacrococcygeal disorders, not elsewhere classified: Secondary | ICD-10-CM | POA: Diagnosis not present

## 2018-03-14 DIAGNOSIS — J439 Emphysema, unspecified: Secondary | ICD-10-CM | POA: Insufficient documentation

## 2018-03-14 DIAGNOSIS — Z79891 Long term (current) use of opiate analgesic: Secondary | ICD-10-CM | POA: Diagnosis not present

## 2018-03-14 DIAGNOSIS — G894 Chronic pain syndrome: Secondary | ICD-10-CM | POA: Insufficient documentation

## 2018-03-14 DIAGNOSIS — M545 Low back pain: Secondary | ICD-10-CM | POA: Diagnosis present

## 2018-03-14 DIAGNOSIS — Z87891 Personal history of nicotine dependence: Secondary | ICD-10-CM | POA: Diagnosis not present

## 2018-03-14 DIAGNOSIS — F1190 Opioid use, unspecified, uncomplicated: Secondary | ICD-10-CM

## 2018-03-14 DIAGNOSIS — Z79899 Other long term (current) drug therapy: Secondary | ICD-10-CM | POA: Insufficient documentation

## 2018-03-14 DIAGNOSIS — M47816 Spondylosis without myelopathy or radiculopathy, lumbar region: Secondary | ICD-10-CM | POA: Diagnosis not present

## 2018-03-14 MED ORDER — TRAMADOL HCL 50 MG PO TABS: 50.0000 mg | ORAL_TABLET | Freq: Four times a day (QID) | ORAL | 3 refills | Status: DC | PRN

## 2018-03-14 MED ORDER — HYDROCODONE-ACETAMINOPHEN 5-325 MG PO TABS: 1.0000 | ORAL_TABLET | Freq: Every day | ORAL | 0 refills | Status: DC

## 2018-03-14 MED ORDER — DIAZEPAM 5 MG PO TABS: ORAL_TABLET | ORAL | 1 refills | Status: DC

## 2018-03-14 NOTE — Progress Notes (Signed)
Subjective:  Patient ID: Carrie Myers, female    DOB: October 07, 1945  Age: 72 y.o. MRN: [id number]  CC: Back Pain (lower)   Procedure: None  HPI Carrie Myers presents for reevaluation.  She was last seen 2 months ago and has been doing well with her regimen.  The quality characteristic distribution of her pain is unchanged.  She is taking her medications as prescribed.  She generally takes 1 Valium every other day and this significantly reduces her muscle spasming at night and helps her sleep.  She is inquiring whether she could increase this to a nightly dose.  Furthermore she takes her Ultram in the morning and 1 Vicodin tablet per day on average that keeps her pain under good control.  No changes in the quality characteristic or distribution of her pain are otherwise noted at this time.  Otherwise she is in her usual state of health.  Based on her narcotic assessment sheet she is getting good relief with the medications and deriving good functional lifestyle improvement with no side effects.  Outpatient Medications Prior to Visit  Medication Sig Dispense Refill  . aspirin EC 81 MG tablet Take 81 mg by mouth daily.    . cyclobenzaprine (FLEXERIL) 10 MG tablet TAKE 1 TABLET BY MOUTH 3  TIMES DAILY 270 tablet 0  . Diclofenac-miSOPROStol 75-0.2 MG TBEC TAKE 1 TABLET BY MOUTH TWO  TIMES DAILY 180 tablet 1  . DULoxetine (CYMBALTA) 60 MG capsule Take 1 capsule (60 mg total) by mouth daily. 30 capsule 5  . esomeprazole (NEXIUM) 20 MG capsule Take 40 mg by mouth daily. Over the Counter    . levothyroxine (SYNTHROID, LEVOTHROID) 88 MCG tablet TAKE 1 TABLET BY MOUTH  DAILY 90 tablet 3  . losartan (COZAAR) 50 MG tablet Take 50 mg by mouth daily.    . Multiple Vitamin (MULTI-VITAMINS) TABS Take by mouth.    . NON FORMULARY     . ondansetron (ZOFRAN ODT) 4 MG disintegrating tablet Take 1 tablet (4 mg total) by mouth every 8 (eight) hours as needed for nausea or vomiting. 2 tablet 0  .  pravastatin (PRAVACHOL) 80 MG tablet Take 80 mg by mouth daily.    Marland Kitchen tolterodine (DETROL LA) 4 MG 24 hr capsule TAKE 1 CAPSULE(4 MG) BY MOUTH DAILY 30 capsule 0  . diazepam (VALIUM) 5 MG tablet Limit  1/2  tablet by mouth per day or every other day if tolerated 15 tablet 1  . traMADol (ULTRAM) 50 MG tablet Take 1 tablet (50 mg total) by mouth every 6 (six) hours as needed. 90 tablet 3  . HYDROcodone-acetaminophen (NORCO/VICODIN) 5-325 MG tablet Take 1 tablet by mouth at bedtime. Limit 1-2 tablets by mouth per day if tolerated 30 tablet 0   No facility-administered medications prior to visit.     Review of Systems CNS: No confusion or sedation Cardiac: No angina or palpitations GI: No abdominal pain or constipation Constitutional: No nausea vomiting fevers or chills  Objective:  BP (!) 150/80   Pulse 92   Temp 98.2 F (36.8 C) (Oral)   Resp 16   Ht 5\' 8"  (1.727 m)   Wt 182 lb (82.6 kg)   SpO2 99%   BMI 27.67 kg/m    BP Readings from Last 3 Encounters:  03/14/18 (!) 150/80  12/22/17 122/78  12/20/17 (!) 127/95     Wt Readings from Last 3 Encounters:  03/14/18 182 lb (82.6 kg)  01/19/18 182 lb (82.6 kg)  12/22/17 182 lb (82.6 kg)     Physical Exam Pt is alert and oriented PERRL EOMI HEART IS RRR no murmur or rub LCTA no wheezing or rales MUSCULOSKELETAL reveals some paraspinous muscle tenderness in the lumbar region.  She is ambulating with a mildly antalgic gait.  Labs  No results found for: HGBA1C Lab Results  Component Value Date   LDLCALC 47 03/18/2017   CREATININE 0.91 12/11/2017    -------------------------------------------------------------------------------------------------------------------- Lab Results  Component Value Date   WBC 14.0 (H) 12/11/2017   HGB 14.2 12/11/2017   HCT 41.1 12/11/2017   PLT 292 12/11/2017   GLUCOSE 121 (H) 12/11/2017   CHOL 122 03/18/2017   TRIG 163 (H) 03/18/2017   HDL 42 03/18/2017   LDLCALC 47 03/18/2017   ALT  30 12/11/2017   AST 35 12/11/2017   NA 139 12/11/2017   K 4.3 12/11/2017   CL 101 12/11/2017   CREATININE 0.91 12/11/2017   BUN 23 12/11/2017   CO2 24 12/11/2017   TSH 4.730 (H) 03/18/2017    --------------------------------------------------------------------------------------------------------------------- Ct Chest Lung Ca Screen Low Dose W/o Cm  Result Date: 01/20/2018 CLINICAL DATA:  Ex-smoker, quitting 11 years ago. Fifty-five pack-year history. EXAM: CT CHEST WITHOUT CONTRAST LOW-DOSE FOR LUNG CANCER SCREENING TECHNIQUE: Multidetector CT imaging of the chest was performed following the standard protocol without IV contrast. COMPARISON:  12/11/2017 chest radiograph.  No prior CT. FINDINGS: Cardiovascular: Aortic atherosclerosis. Tortuous thoracic aorta. Normal heart size, without pericardial effusion. Lad and left main coronary artery calcification. Mediastinum/Nodes: No mediastinal or definite hilar adenopathy, given limitations of unenhanced CT. Lungs/Pleura: No pleural fluid. Mild to moderate centrilobular emphysema. Left lower lobe scarring. Pulmonary nodules of maximally volume derived equivalent diameter 3.3 mm in the right lower lobe. Upper Abdomen: Mild hepatic steatosis. Normal imaged portions of the spleen, stomach, pancreas, gallbladder, adrenal glands. Musculoskeletal: Lower cervical and upper thoracic spondylosis. IMPRESSION: 1. Lung-RADS 2, benign appearance or behavior. Continue annual screening with low-dose chest CT without contrast in 12 months. 2. Aortic atherosclerosis (ICD10-I70.0), coronary artery atherosclerosis and emphysema (ICD10-J43.9). 3. Mild hepatic steatosis. Electronically Signed   By: Abigail Miyamoto M.D.   On: 01/20/2018 11:32     Assessment & Plan:   Carrie Myers was seen today for back pain.  Diagnoses and all orders for this visit:  Chronic bilateral low back pain with bilateral sciatica  DDD (degenerative disc disease), lumbar  Chronic pain  syndrome  Facet syndrome, lumbar  Fibromyalgia  Cervical facet joint syndrome  Sacroiliac joint disease  Generalized osteoarthritis  Chronic, continuous use of opioids  Other orders -     Discontinue: HYDROcodone-acetaminophen (NORCO/VICODIN) 5-325 MG tablet; Take 1 tablet by mouth at bedtime. Limit 1-2 tablets by mouth per day if tolerated -     HYDROcodone-acetaminophen (NORCO/VICODIN) 5-325 MG tablet; Take 1 tablet by mouth at bedtime. Limit 1-2 tablets by mouth per day if tolerated -     traMADol (ULTRAM) 50 MG tablet; Take 1 tablet (50 mg total) by mouth every 6 (six) hours as needed. -     diazepam (VALIUM) 5 MG tablet; Limit  1/2  tablet by mouth per day or every other day if tolerated        ----------------------------------------------------------------------------------------------------------------------  Problem List Items Addressed This Visit      Unprioritized   Chronic pain syndrome (Chronic)   Facet syndrome, lumbar   Relevant Medications   HYDROcodone-acetaminophen (NORCO/VICODIN) 5-325 MG tablet (Start on 04/13/2018)   traMADol (ULTRAM) 50 MG tablet  Fibromyalgia   Generalized osteoarthritis   Relevant Medications   HYDROcodone-acetaminophen (NORCO/VICODIN) 5-325 MG tablet (Start on 04/13/2018)   traMADol (ULTRAM) 50 MG tablet   Sacroiliac joint disease (Chronic)    Other Visit Diagnoses    Chronic bilateral low back pain with bilateral sciatica    -  Primary   Relevant Medications   HYDROcodone-acetaminophen (NORCO/VICODIN) 5-325 MG tablet (Start on 04/13/2018)   traMADol (ULTRAM) 50 MG tablet   diazepam (VALIUM) 5 MG tablet   DDD (degenerative disc disease), lumbar       Relevant Medications   HYDROcodone-acetaminophen (NORCO/VICODIN) 5-325 MG tablet (Start on 04/13/2018)   traMADol (ULTRAM) 50 MG tablet   Cervical facet joint syndrome       Relevant Medications   HYDROcodone-acetaminophen (NORCO/VICODIN) 5-325 MG tablet (Start on 04/13/2018)    traMADol (ULTRAM) 50 MG tablet   Chronic, continuous use of opioids            ----------------------------------------------------------------------------------------------------------------------  1. Chronic bilateral low back pain with bilateral sciatica We will keep her on her current regimen.  I am going to enable her to increase her Valium to once daily.  We talked about using this on average 5 to 6 days/week and she may continue with her once a day hydrocodone.  I think this regimen is reasonable.  We will have return to clinic in 2 months for reevaluation at that time.  Continue with her core stretching strengthening exercises as previously discussed.  2. DDD (degenerative disc disease), lumbar   3. Chronic pain syndrome We have reviewed the [facility] practitioner database information and it is appropriate.  Refills will be given for her Vicodin today and tramadol as well  4. Facet syndrome, lumbar   5. Fibromyalgia   6. Cervical facet joint syndrome   7. Sacroiliac joint disease   8. Generalized osteoarthritis   9. Chronic, continuous use of opioids As above    ----------------------------------------------------------------------------------------------------------------------  I am having Carrie Myers maintain her esomeprazole, losartan, pravastatin, aspirin EC, MULTI-VITAMINS, NON FORMULARY, levothyroxine, cyclobenzaprine, ondansetron, tolterodine, DULoxetine, Diclofenac-miSOPROStol, HYDROcodone-acetaminophen, traMADol, and diazepam.   Meds ordered this encounter  Medications  . DISCONTD: HYDROcodone-acetaminophen (NORCO/VICODIN) 5-325 MG tablet    Sig: Take 1 tablet by mouth at bedtime. Limit 1-2 tablets by mouth per day if tolerated    Dispense:  30 tablet    Refill:  0    Do Not Fill Until [id number]  . HYDROcodone-acetaminophen (NORCO/VICODIN) 5-325 MG tablet    Sig: Take 1 tablet by mouth at bedtime. Limit 1-2 tablets by mouth per day if  tolerated    Dispense:  30 tablet    Refill:  0    Do Not Fill Until [id number]  . traMADol (ULTRAM) 50 MG tablet    Sig: Take 1 tablet (50 mg total) by mouth every 6 (six) hours as needed.    Dispense:  90 tablet    Refill:  3  . diazepam (VALIUM) 5 MG tablet    Sig: Limit  1/2  tablet by mouth per day or every other day if tolerated    Dispense:  30 tablet    Refill:  1    Needs to last one month   Patient's Medications  New Prescriptions   No medications on file  Previous Medications   ASPIRIN EC 81 MG TABLET    Take 81 mg by mouth daily.   CYCLOBENZAPRINE (FLEXERIL) 10 MG TABLET    TAKE 1 TABLET BY MOUTH 3  TIMES DAILY   DICLOFENAC-MISOPROSTOL 75-0.2 MG TBEC    TAKE 1 TABLET BY MOUTH TWO  TIMES DAILY   DULOXETINE (CYMBALTA) 60 MG CAPSULE    Take 1 capsule (60 mg total) by mouth daily.   ESOMEPRAZOLE (NEXIUM) 20 MG CAPSULE    Take 40 mg by mouth daily. Over the Counter   LEVOTHYROXINE (SYNTHROID, LEVOTHROID) 88 MCG TABLET    TAKE 1 TABLET BY MOUTH  DAILY   LOSARTAN (COZAAR) 50 MG TABLET    Take 50 mg by mouth daily.   MULTIPLE VITAMIN (MULTI-VITAMINS) TABS    Take by mouth.   NON FORMULARY       ONDANSETRON (ZOFRAN ODT) 4 MG DISINTEGRATING TABLET    Take 1 tablet (4 mg total) by mouth every 8 (eight) hours as needed for nausea or vomiting.   PRAVASTATIN (PRAVACHOL) 80 MG TABLET    Take 80 mg by mouth daily.   TOLTERODINE (DETROL LA) 4 MG 24 HR CAPSULE    TAKE 1 CAPSULE(4 MG) BY MOUTH DAILY  Modified Medications   Modified Medication Previous Medication   DIAZEPAM (VALIUM) 5 MG TABLET diazepam (VALIUM) 5 MG tablet      Limit  1/2  tablet by mouth per day or every other day if tolerated    Limit  1/2  tablet by mouth per day or every other day if tolerated   HYDROCODONE-ACETAMINOPHEN (NORCO/VICODIN) 5-325 MG TABLET HYDROcodone-acetaminophen (NORCO/VICODIN) 5-325 MG tablet      Take 1 tablet by mouth at bedtime. Limit 1-2 tablets by mouth per day if tolerated    Take 1 tablet by  mouth at bedtime. Limit 1-2 tablets by mouth per day if tolerated   TRAMADOL (ULTRAM) 50 MG TABLET traMADol (ULTRAM) 50 MG tablet      Take 1 tablet (50 mg total) by mouth every 6 (six) hours as needed.    Take 1 tablet (50 mg total) by mouth every 6 (six) hours as needed.  Discontinued Medications   No medications on file   ----------------------------------------------------------------------------------------------------------------------  Follow-up: Return before 05/13/2018.    Molli Barrows, MD

## 2018-03-14 NOTE — Progress Notes (Signed)
Nursing Pain Medication Assessment:  Safety precautions to be maintained throughout the outpatient stay will include: orient to surroundings, keep bed in low position, maintain call bell within reach at all times, provide assistance with transfer out of bed and ambulation.  Medication Inspection Compliance: Pill count conducted under aseptic conditions, in front of the patient. Neither the pills nor the bottle was removed from the patient's sight at any time. Once count was completed pills were immediately returned to the patient in their original bottle.  Medication #1: Hydrocodone/APAP Pill/Patch Count: 0 of 30 pills remain Pill/Patch Appearance: Markings consistent with prescribed medication Bottle Appearance: Standard pharmacy container. Clearly labeled. Filled Date: 11/07 / 2019 Last Medication intake:  Yesterday  Medication #2: valium  Pill/Patch Count: 0 of 15 pills remain Pill/Patch Appearance: Markings consistent with prescribed medication Bottle Appearance: Standard pharmacy container. Clearly labeled. Filled Date: 11/14// 2019 Last Medication intake:  Today

## 2018-03-14 NOTE — Patient Instructions (Signed)
Prescriptions for Hydrocodone X 2, Valium and Tramadol handed to patient prior to discharge.

## 2018-03-20 ENCOUNTER — Encounter: Payer: Self-pay | Admitting: Internal Medicine

## 2018-03-20 ENCOUNTER — Ambulatory Visit (INDEPENDENT_AMBULATORY_CARE_PROVIDER_SITE_OTHER): Payer: Medicare Other | Admitting: Internal Medicine

## 2018-03-20 ENCOUNTER — Other Ambulatory Visit: Payer: Self-pay

## 2018-03-20 VITALS — BP 128/84 | HR 84 | Ht 68.0 in | Wt 182.8 lb

## 2018-03-20 DIAGNOSIS — R05 Cough: Secondary | ICD-10-CM | POA: Diagnosis not present

## 2018-03-20 DIAGNOSIS — E2839 Other primary ovarian failure: Secondary | ICD-10-CM | POA: Diagnosis not present

## 2018-03-20 DIAGNOSIS — Z1211 Encounter for screening for malignant neoplasm of colon: Secondary | ICD-10-CM

## 2018-03-20 DIAGNOSIS — E782 Mixed hyperlipidemia: Secondary | ICD-10-CM | POA: Diagnosis not present

## 2018-03-20 DIAGNOSIS — E890 Postprocedural hypothyroidism: Secondary | ICD-10-CM

## 2018-03-20 DIAGNOSIS — F331 Major depressive disorder, recurrent, moderate: Secondary | ICD-10-CM

## 2018-03-20 DIAGNOSIS — R197 Diarrhea, unspecified: Secondary | ICD-10-CM

## 2018-03-20 DIAGNOSIS — I10 Essential (primary) hypertension: Secondary | ICD-10-CM

## 2018-03-20 DIAGNOSIS — R059 Cough, unspecified: Secondary | ICD-10-CM

## 2018-03-20 DIAGNOSIS — M4696 Unspecified inflammatory spondylopathy, lumbar region: Secondary | ICD-10-CM | POA: Diagnosis not present

## 2018-03-20 LAB — POCT URINALYSIS DIPSTICK
BILIRUBIN UA: NEGATIVE
Blood, UA: NEGATIVE
GLUCOSE UA: NEGATIVE
KETONES UA: NEGATIVE
Leukocytes, UA: NEGATIVE
NITRITE UA: NEGATIVE
PROTEIN UA: NEGATIVE
SPEC GRAV UA: 1.01 (ref 1.010–1.025)
Urobilinogen, UA: 0.2 E.U./dL
pH, UA: 6 (ref 5.0–8.0)

## 2018-03-20 MED ORDER — METRONIDAZOLE 500 MG PO TABS: 500.0000 mg | ORAL_TABLET | Freq: Three times a day (TID) | ORAL | 0 refills | Status: AC

## 2018-03-20 MED ORDER — BENZONATATE 100 MG PO CAPS: 100.0000 mg | ORAL_CAPSULE | Freq: Three times a day (TID) | ORAL | 0 refills | Status: DC

## 2018-03-20 NOTE — Progress Notes (Signed)
Date:  03/20/2018   Name:  Carrie Myers   DOB:  [birth year]   MRN:  [id number]   Chief Complaint: Hypertension and Depression Patient presents for her annual exam.  She remains tobacco free.  She had LDCT last fall which was negative. She is due for a mammogram (last done 03/2016)- ordered but not yet scheduled.  She is also due this year for a colonoscopy and DEXA.  Immunizations are up to date.  Hypertension  The problem is controlled. Pertinent negatives include no chest pain, headaches, palpitations or shortness of breath. Past treatments include angiotensin blockers. The current treatment provides significant improvement. Identifiable causes of hypertension include a thyroid problem.  Hyperlipidemia  The problem is controlled. Pertinent negatives include no chest pain or shortness of breath. Current antihyperlipidemic treatment includes statins.  Thyroid Problem  Presents for follow-up visit. Symptoms include diarrhea (3-4 times per day - semi loose, no blood; after eating). Patient reports no anxiety, constipation, fatigue, palpitations or tremors. Her past medical history is significant for hyperlipidemia.  Depression         This is a chronic problem.The problem is unchanged.  Associated symptoms include no fatigue and no headaches.  Past treatments include SNRIs - Serotonin and norepinephrine reuptake inhibitors.  Compliance with treatment is good.  Past medical history includes thyroid problem.   Back Pain  This is a chronic problem. The pain is present in the lumbar spine (followed by pain management). Pertinent negatives include no abdominal pain, chest pain, dysuria, fever or headaches. She has tried analgesics and NSAIDs for the symptoms. The treatment provided moderate relief.    Review of Systems  Constitutional: Negative for chills, fatigue, fever and unexpected weight change.  HENT: Positive for congestion, sinus pressure and sinus pain. Negative for hearing loss,  tinnitus, trouble swallowing and voice change.   Eyes: Negative for visual disturbance.  Respiratory: Negative for cough, chest tightness, shortness of breath and wheezing.   Cardiovascular: Negative for chest pain, palpitations and leg swelling.  Gastrointestinal: Positive for diarrhea (3-4 times per day - semi loose, no blood; after eating). Negative for abdominal pain, constipation and vomiting.  Endocrine: Negative for polydipsia and polyuria.  Genitourinary: Negative for dysuria, frequency, genital sores, vaginal bleeding and vaginal discharge.  Musculoskeletal: Positive for back pain. Negative for arthralgias, gait problem and joint swelling.  Skin: Negative for color change and rash.  Neurological: Negative for dizziness, tremors, light-headedness and headaches.  Hematological: Negative for adenopathy. Does not bruise/bleed easily.  Psychiatric/Behavioral: Positive for depression. Negative for dysphoric mood and sleep disturbance. The patient is not nervous/anxious.     Patient Active Problem List   Diagnosis Date Noted  . Tobacco use disorder, moderate, in sustained remission 12/22/2017  . Moderate episode of recurrent major depressive disorder ([place]) 10/03/2017  . Inflammatory spondylopathy of lumbar region ([place]) 10/03/2017  . Hoarseness, persistent 04/18/2017  . OSA on CPAP 02/18/2017  . Cervical disc disease 12/31/2016  . Generalized osteoarthritis 12/23/2016  . Chronic pain syndrome 12/23/2016  . Cystocele, midline 12/06/2016  . Urinary incontinence 12/06/2016  . Impingement syndrome of right shoulder 04/17/2015  . Fibrocystic breast 01/18/2015  . Gastroesophageal reflux disease without esophagitis 01/18/2015  . Hypothyroidism, postablative 01/18/2015  . Arteriosclerosis of coronary artery 12/29/2014  . DJD of shoulder 11/13/2014  . Fibromyalgia 10/02/2014  . Intercostal neuralgia 10/02/2014  . Facet syndrome, lumbar 10/02/2014  . Sacroiliac joint disease 10/02/2014  .  Carotid artery narrowing 07/09/2014  . Benign essential HTN  07/01/2014  . Osteoarthritis of thumb 09/24/2012  . Mixed hyperlipidemia 09/22/2012  . Arthritis of hand, degenerative 09/22/2012    Allergies  Allergen Reactions  . Augmentin [Amoxicillin-Pot Clavulanate] Nausea And Vomiting  . Codeine Nausea And Vomiting  . Sulfa Antibiotics Nausea And Vomiting  . Proparacaine Itching  . Shellfish Allergy Nausea And Vomiting, Nausea Only and Other (See Comments)    No issues with iodine     Past Surgical History:  Procedure Laterality Date  . ABDOMINAL HYSTERECTOMY     total  . APPENDECTOMY    . BREAST BIOPSY Right    neg  . BREAST CYST ASPIRATION Left    neg  . BUNIONECTOMY Bilateral   . CESAREAN SECTION    . COLONOSCOPY  2010   normal  . CORONARY ANGIOPLASTY WITH STENT PLACEMENT  04/2006  . dental implant     [age]  . epidural steroid injection  2018  . SHOULDER ARTHROSCOPY Right 05/07/2015   Procedure: Extensive arthroscopic debridement and arthroscopic subacromial decompression, right shoulder.  2. Steroid injection right thumb CMC joint.;  Surgeon: Corky Mull, MD;  Location: [facility];  Service: Orthopedics;  Laterality: Right;  . TOOTH EXTRACTION     Upper centrals and lateral    Social History   Tobacco Use  . Smoking status: Former Smoker    Packs/day: 1.25    Years: 44.00    Pack years: 55.00    Types: Cigarettes    Last attempt to quit: 03/15/2006    Years since quitting: 12.0  . Smokeless tobacco: Former Systems developer    Quit date: 04/22/2006  . Tobacco comment: quit 8 years  Substance Use Topics  . Alcohol use: No    Alcohol/week: 0.0 standard drinks  . Drug use: No     Medication list has been reviewed and updated.  Current Meds  Medication Sig  . aspirin EC 81 MG tablet Take 81 mg by mouth daily.  . cyclobenzaprine (FLEXERIL) 10 MG tablet TAKE 1 TABLET BY MOUTH 3  TIMES DAILY  . diazepam (VALIUM) 5 MG tablet Limit  1/2   tablet by mouth per day or every other day if tolerated  . Diclofenac-miSOPROStol 75-0.2 MG TBEC TAKE 1 TABLET BY MOUTH TWO  TIMES DAILY  . DULoxetine (CYMBALTA) 60 MG capsule Take 1 capsule (60 mg total) by mouth daily.  Marland Kitchen esomeprazole (NEXIUM) 20 MG capsule Take 40 mg by mouth daily. Over the Counter  . [START ON 04/13/2018] HYDROcodone-acetaminophen (NORCO/VICODIN) 5-325 MG tablet Take 1 tablet by mouth at bedtime. Limit 1-2 tablets by mouth per day if tolerated  . levothyroxine (SYNTHROID, LEVOTHROID) 88 MCG tablet TAKE 1 TABLET BY MOUTH  DAILY  . losartan (COZAAR) 50 MG tablet Take 50 mg by mouth daily.  . Multiple Vitamin (MULTI-VITAMINS) TABS Take by mouth.  . NON FORMULARY   . pravastatin (PRAVACHOL) 80 MG tablet Take 80 mg by mouth daily.  Marland Kitchen tolterodine (DETROL LA) 4 MG 24 hr capsule TAKE 1 CAPSULE(4 MG) BY MOUTH DAILY  . traMADol (ULTRAM) 50 MG tablet Take 1 tablet (50 mg total) by mouth every 6 (six) hours as needed.    PHQ 2/9 Scores 03/20/2018 03/20/2018 03/14/2018 12/20/2017  PHQ - 2 Score 0 0 0 0  PHQ- 9 Score 0 - - -  Exception Documentation - - - -    Physical Exam Vitals signs and nursing note reviewed.  Constitutional:      General: She is not in acute distress.  Appearance: She is well-developed.  HENT:     Head: Normocephalic and atraumatic.     Right Ear: Tympanic membrane and ear canal normal.     Left Ear: Tympanic membrane and ear canal normal.     Nose: Nose normal.     Right Sinus: No maxillary sinus tenderness.     Left Sinus: No maxillary sinus tenderness.     Mouth/Throat:     Mouth: Mucous membranes are moist.     Pharynx: Oropharynx is clear.  Eyes:     General: No scleral icterus.       Right eye: No discharge.        Left eye: No discharge.     Conjunctiva/sclera: Conjunctivae normal.  Neck:     Musculoskeletal: Normal range of motion. No erythema.     Thyroid: No thyromegaly.     Vascular: No carotid bruit.  Cardiovascular:     Rate and  Rhythm: Normal rate and regular rhythm.     Pulses: Normal pulses.     Heart sounds: Normal heart sounds.  Pulmonary:     Effort: Pulmonary effort is normal. No respiratory distress.     Breath sounds: No wheezing.  Chest:     Breasts:        Right: No mass, nipple discharge, skin change or tenderness.        Left: No mass, nipple discharge, skin change or tenderness.  Abdominal:     General: Abdomen is flat. Bowel sounds are normal.     Palpations: Abdomen is soft.     Tenderness: There is abdominal tenderness in the left lower quadrant. There is no right CVA tenderness, guarding or rebound.  Musculoskeletal: Normal range of motion.  Lymphadenopathy:     Cervical: No cervical adenopathy.  Skin:    General: Skin is warm and dry.     Findings: No rash.  Neurological:     Mental Status: She is alert and oriented to person, place, and time.     Cranial Nerves: No cranial nerve deficit.     Sensory: No sensory deficit.     Deep Tendon Reflexes: Reflexes are normal and symmetric.  Psychiatric:        Speech: Speech normal.        Behavior: Behavior normal.        Thought Content: Thought content normal.    Wt Readings from Last 3 Encounters:  03/20/18 182 lb 12.8 oz (82.9 kg)  03/14/18 182 lb (82.6 kg)  01/19/18 182 lb (82.6 kg)    BP 128/84 (BP Location: Right Arm, Patient Position: Sitting, Cuff Size: Normal)   Pulse 84   Ht 5\' 8"  (1.727 m)   Wt 182 lb 12.8 oz (82.9 kg)   SpO2 98%   BMI 27.79 kg/m   Assessment and Plan: 1. Benign essential HTN controlled - CBC with Differential/Platelet - POCT urinalysis dipstick  2. Hypothyroidism, postablative supplemented - TSH  3. Mixed hyperlipidemia On statin therapy - Comprehensive metabolic panel - Lipid panel  4. Moderate episode of recurrent major depressive disorder ([place]) Doing well on current therapy  5. Inflammatory spondylopathy of lumbar region ([place]) Stable Continue narcotics, nsaids  6. Colon cancer  screening Due for colonoscopy - Ambulatory referral to Gastroenterology  7. Ovarian failure - DG Bone Density; Future Will request mammogram to be scheduled along with DEXA  8. Diarrhea of presumed infectious origin Suspect low grade diverticulitis - metroNIDAZOLE (FLAGYL) 500 MG tablet; Take 1 tablet (500 mg total)  by mouth 3 (three) times daily for 7 days.  Dispense: 21 tablet; Refill: 0  9. Cough Use tessalon perles PRN - benzonatate (TESSALON) 100 MG capsule; Take 1 capsule (100 mg total) by mouth 3 (three) times daily.  Dispense: 20 capsule; Refill: 0   Partially dictated using Editor, commissioning. Any errors are unintentional.  Halina Maidens, MD [facility]  03/20/2018

## 2018-03-20 NOTE — Patient Instructions (Signed)
Health Maintenance  Topic Date Due  . DEXA SCAN  04/26/2012  . MAMMOGRAM  03/23/2017  . COLONOSCOPY  03/16/2018  . TETANUS/TDAP  09/07/2021  . INFLUENZA VACCINE  Completed  . Hepatitis C Screening  Completed  . PNA vac Low Risk Adult  Completed     Breast Self-Awareness Breast self-awareness means being familiar with how your breasts look and feel. It involves checking your breasts regularly and reporting any changes to your health care provider. Practicing breast self-awareness is important. A change in your breasts can be a sign of a serious medical problem. Being familiar with how your breasts look and feel allows you to find any problems early, when treatment is more likely to be successful. All women should practice breast self-awareness, including women who have had breast implants. How to do a breast self-exam One way to learn what is normal for your breasts and whether your breasts are changing is to do a breast self-exam. To do a breast self-exam: Look for Changes  1. Remove all the clothing above your waist. 2. Stand in front of a mirror in a room with good lighting. 3. Put your hands on your hips. 4. Push your hands firmly downward. 5. Compare your breasts in the mirror. Look for differences between them (asymmetry), such as: ? Differences in shape. ? Differences in size. ? Puckers, dips, and bumps in one breast and not the other. 6. Look at each breast for changes in your skin, such as: ? Redness. ? Scaly areas. 7. Look for changes in your nipples, such as: ? Discharge. ? Bleeding. ? Dimpling. ? Redness. ? A change in position. Feel for Changes Carefully feel your breasts for lumps and changes. It is best to do this while lying on your back on the floor and again while sitting or standing in the shower or tub with soapy water on your skin. Feel each breast in the following way:  Place the arm on the side of the breast you are examining above your head.  Feel your  breast with the other hand.  Start in the nipple area and make  inch (2 cm) overlapping circles to feel your breast. Use the pads of your three middle fingers to do this. Apply light pressure, then medium pressure, then firm pressure. The light pressure will allow you to feel the tissue closest to the skin. The medium pressure will allow you to feel the tissue that is a little deeper. The firm pressure will allow you to feel the tissue close to the ribs.  Continue the overlapping circles, moving downward over the breast until you feel your ribs below your breast.  Move one finger-width toward the center of the body. Continue to use the  inch (2 cm) overlapping circles to feel your breast as you move slowly up toward your collarbone.  Continue the up and down exam using all three pressures until you reach your armpit.  Write Down What You Find  Write down what is normal for each breast and any changes that you find. Keep a written record with breast changes or normal findings for each breast. By writing this information down, you do not need to depend only on memory for size, tenderness, or location. Write down where you are in your menstrual cycle, if you are still menstruating. If you are having trouble noticing differences in your breasts, do not get discouraged. With time you will become more familiar with the variations in your breasts and more  comfortable with the exam. How often should I examine my breasts? Examine your breasts every month. If you are breastfeeding, the best time to examine your breasts is after a feeding or after using a breast pump. If you menstruate, the best time to examine your breasts is 5-7 days after your period is over. During your period, your breasts are lumpier, and it may be more difficult to notice changes. When should I see my health care provider? See your health care provider if you notice:  A change in shape or size of your breasts or nipples.  A change  in the skin of your breast or nipples, such as a reddened or scaly area.  Unusual discharge from your nipples.  A lump or thick area that was not there before.  Pain in your breasts.  Anything that concerns you. This information is not intended to replace advice given to you by your health care provider. Make sure you discuss any questions you have with your health care provider. Document Released: 03/01/2005 Document Revised: 08/07/2015 Document Reviewed: 01/19/2015 Elsevier Interactive Patient Education  2019 [employer].

## 2018-03-21 LAB — CBC WITH DIFFERENTIAL/PLATELET
Basophils Absolute: 0.1 10*3/uL (ref 0.0–0.2)
Basos: 1 %
EOS (ABSOLUTE): 0.2 10*3/uL (ref 0.0–0.4)
EOS: 3 %
HEMATOCRIT: 36 % (ref 34.0–46.6)
HEMOGLOBIN: 11.8 g/dL (ref 11.1–15.9)
IMMATURE GRANULOCYTES: 0 %
Immature Grans (Abs): 0 10*3/uL (ref 0.0–0.1)
LYMPHS ABS: 1.6 10*3/uL (ref 0.7–3.1)
Lymphs: 23 %
MCH: 28.8 pg (ref 26.6–33.0)
MCHC: 32.8 g/dL (ref 31.5–35.7)
MCV: 88 fL (ref 79–97)
Monocytes Absolute: 0.4 10*3/uL (ref 0.1–0.9)
Monocytes: 6 %
NEUTROS PCT: 67 %
Neutrophils Absolute: 4.4 10*3/uL (ref 1.4–7.0)
Platelets: 341 10*3/uL (ref 150–450)
RBC: 4.1 x10E6/uL (ref 3.77–5.28)
RDW: 12.7 % (ref 11.7–15.4)
WBC: 6.7 10*3/uL (ref 3.4–10.8)

## 2018-03-21 LAB — COMPREHENSIVE METABOLIC PANEL
ALBUMIN: 4.3 g/dL (ref 3.5–4.8)
ALT: 28 IU/L (ref 0–32)
AST: 30 IU/L (ref 0–40)
Albumin/Globulin Ratio: 1.7 (ref 1.2–2.2)
Alkaline Phosphatase: 86 IU/L (ref 39–117)
BUN / CREAT RATIO: 16 (ref 12–28)
BUN: 14 mg/dL (ref 8–27)
Bilirubin Total: <0.2 mg/dL (ref 0.0–1.2)
CALCIUM: 9.6 mg/dL (ref 8.7–10.3)
CO2: 25 mmol/L (ref 20–29)
CREATININE: 0.9 mg/dL (ref 0.57–1.00)
Chloride: 102 mmol/L (ref 96–106)
GFR calc Af Amer: 75 mL/min/{1.73_m2} (ref >59)
GFR, EST NON AFRICAN AMERICAN: 65 mL/min/{1.73_m2} (ref >59)
GLOBULIN, TOTAL: 2.6 g/dL (ref 1.5–4.5)
Glucose: 97 mg/dL (ref 65–99)
Potassium: 5 mmol/L (ref 3.5–5.2)
SODIUM: 143 mmol/L (ref 134–144)
Total Protein: 6.9 g/dL (ref 6.0–8.5)

## 2018-03-21 LAB — LIPID PANEL
CHOL/HDL RATIO: 4.2 ratio (ref 0.0–4.4)
Cholesterol, Total: 188 mg/dL (ref 100–199)
HDL: 45 mg/dL (ref >39)
LDL CALC: 105 mg/dL — AB (ref 0–99)
Triglycerides: 191 mg/dL — ABNORMAL HIGH (ref 0–149)
VLDL Cholesterol Cal: 38 mg/dL (ref 5–40)

## 2018-03-21 LAB — TSH: TSH: 3.95 u[IU]/mL (ref 0.450–4.500)

## 2018-03-22 NOTE — Progress Notes (Signed)
Tried calling patient and phone has been disconnected. Will mail labs for patient.

## 2018-03-28 ENCOUNTER — Other Ambulatory Visit: Payer: Self-pay

## 2018-03-28 ENCOUNTER — Encounter: Payer: Self-pay | Admitting: *Deleted

## 2018-04-03 ENCOUNTER — Ambulatory Visit
Admission: RE | Admit: 2018-04-03 | Discharge: 2018-04-03 | Disposition: A | Discharge status: Home / Self Care | Payer: Medicare Other | Attending: Gastroenterology | Admitting: Gastroenterology

## 2018-04-03 ENCOUNTER — Ambulatory Visit: Payer: Medicare Other | Admitting: Anesthesiology

## 2018-04-03 ENCOUNTER — Encounter
Admission: RE | Disposition: A | Discharge status: Home / Self Care | Payer: Self-pay | Source: Home / Self Care | Attending: Gastroenterology

## 2018-04-03 DIAGNOSIS — K219 Gastro-esophageal reflux disease without esophagitis: Secondary | ICD-10-CM | POA: Insufficient documentation

## 2018-04-03 DIAGNOSIS — R195 Other fecal abnormalities: Secondary | ICD-10-CM

## 2018-04-03 DIAGNOSIS — R194 Change in bowel habit: Secondary | ICD-10-CM

## 2018-04-03 DIAGNOSIS — E785 Hyperlipidemia, unspecified: Secondary | ICD-10-CM | POA: Insufficient documentation

## 2018-04-03 DIAGNOSIS — E079 Disorder of thyroid, unspecified: Secondary | ICD-10-CM | POA: Diagnosis not present

## 2018-04-03 DIAGNOSIS — G4730 Sleep apnea, unspecified: Secondary | ICD-10-CM | POA: Diagnosis not present

## 2018-04-03 DIAGNOSIS — M5136 Other intervertebral disc degeneration, lumbar region: Secondary | ICD-10-CM | POA: Insufficient documentation

## 2018-04-03 DIAGNOSIS — I10 Essential (primary) hypertension: Secondary | ICD-10-CM | POA: Diagnosis not present

## 2018-04-03 DIAGNOSIS — M797 Fibromyalgia: Secondary | ICD-10-CM | POA: Diagnosis not present

## 2018-04-03 DIAGNOSIS — I252 Old myocardial infarction: Secondary | ICD-10-CM | POA: Diagnosis not present

## 2018-04-03 DIAGNOSIS — K5730 Diverticulosis of large intestine without perforation or abscess without bleeding: Secondary | ICD-10-CM | POA: Diagnosis not present

## 2018-04-03 DIAGNOSIS — Z1211 Encounter for screening for malignant neoplasm of colon: Secondary | ICD-10-CM

## 2018-04-03 DIAGNOSIS — Z7982 Long term (current) use of aspirin: Secondary | ICD-10-CM | POA: Diagnosis not present

## 2018-04-03 DIAGNOSIS — F419 Anxiety disorder, unspecified: Secondary | ICD-10-CM | POA: Insufficient documentation

## 2018-04-03 DIAGNOSIS — Z87442 Personal history of urinary calculi: Secondary | ICD-10-CM | POA: Insufficient documentation

## 2018-04-03 DIAGNOSIS — Z79899 Other long term (current) drug therapy: Secondary | ICD-10-CM | POA: Diagnosis not present

## 2018-04-03 DIAGNOSIS — Z87891 Personal history of nicotine dependence: Secondary | ICD-10-CM | POA: Diagnosis not present

## 2018-04-03 DIAGNOSIS — Z955 Presence of coronary angioplasty implant and graft: Secondary | ICD-10-CM | POA: Diagnosis not present

## 2018-04-03 DIAGNOSIS — M069 Rheumatoid arthritis, unspecified: Secondary | ICD-10-CM | POA: Insufficient documentation

## 2018-04-03 HISTORY — PX: COLONOSCOPY WITH PROPOFOL: SHX5780

## 2018-04-03 HISTORY — DX: Sleep apnea, unspecified: G47.30

## 2018-04-03 HISTORY — DX: Dizziness and giddiness: R42

## 2018-04-03 SURGERY — COLONOSCOPY WITH PROPOFOL
Anesthesia: General | Site: Rectum

## 2018-04-03 MED ORDER — STERILE WATER FOR IRRIGATION IR SOLN
Status: DC | PRN
  Administered 2018-04-03: 10:00:00

## 2018-04-03 MED ORDER — LACTATED RINGERS IV SOLN
INTRAVENOUS | Status: DC
  Administered 2018-04-03: 10:00:00 via INTRAVENOUS

## 2018-04-03 MED ORDER — LIDOCAINE HCL (CARDIAC) PF 100 MG/5ML IV SOSY
PREFILLED_SYRINGE | INTRAVENOUS | Status: DC | PRN
  Administered 2018-04-03: 40 mg via INTRAVENOUS

## 2018-04-03 MED ORDER — PROPOFOL 10 MG/ML IV BOLUS
INTRAVENOUS | Status: DC | PRN
  Administered 2018-04-03 (×2): 30 mg via INTRAVENOUS
  Administered 2018-04-03: 20 mg via INTRAVENOUS
  Administered 2018-04-03: 30 mg via INTRAVENOUS
  Administered 2018-04-03: 70 mg via INTRAVENOUS
  Administered 2018-04-03: 20 mg via INTRAVENOUS
  Administered 2018-04-03: 30 mg via INTRAVENOUS
  Administered 2018-04-03: 20 mg via INTRAVENOUS
  Administered 2018-04-03: 30 mg via INTRAVENOUS

## 2018-04-03 SURGICAL SUPPLY — 24 items
CANISTER SUCT 1200ML W/VALVE (MISCELLANEOUS) ×3 IMPLANT
CLIP HMST 235XBRD CATH ROT (MISCELLANEOUS) IMPLANT
CLIP RESOLUTION 360 11X235 (MISCELLANEOUS)
ELECT REM PT RETURN 9FT ADLT (ELECTROSURGICAL)
ELECTRODE REM PT RTRN 9FT ADLT (ELECTROSURGICAL) IMPLANT
FCP ESCP3.2XJMB 240X2.8X (MISCELLANEOUS) ×1
FORCEPS BIOP RAD 4 LRG CAP 4 (CUTTING FORCEPS) IMPLANT
FORCEPS BIOP RJ4 240 W/NDL (MISCELLANEOUS) ×3
FORCEPS ESCP3.2XJMB 240X2.8X (MISCELLANEOUS) IMPLANT
GOWN CVR UNV OPN BCK APRN NK (MISCELLANEOUS) ×2 IMPLANT
GOWN ISOL THUMB LOOP REG UNIV (MISCELLANEOUS) ×6
INJECTOR VARIJECT VIN23 (MISCELLANEOUS) IMPLANT
KIT DEFENDO VALVE AND CONN (KITS) IMPLANT
KIT ENDO PROCEDURE OLY (KITS) ×3 IMPLANT
MARKER SPOT ENDO TATTOO 5ML (MISCELLANEOUS) IMPLANT
PROBE APC STR FIRE (PROBE) IMPLANT
RETRIEVER NET ROTH 2.5X230 LF (MISCELLANEOUS) IMPLANT
SNARE SHORT THROW 13M SML OVAL (MISCELLANEOUS) IMPLANT
SNARE SHORT THROW 30M LRG OVAL (MISCELLANEOUS) IMPLANT
SNARE SNG USE RND 15MM (INSTRUMENTS) IMPLANT
SPOT EX ENDOSCOPIC TATTOO (MISCELLANEOUS)
TRAP ETRAP POLY (MISCELLANEOUS) IMPLANT
VARIJECT INJECTOR VIN23 (MISCELLANEOUS)
WATER STERILE IRR 250ML POUR (IV SOLUTION) ×3 IMPLANT

## 2018-04-03 NOTE — Transfer of Care (Signed)
Immediate Anesthesia Transfer of Care Note  Patient: Carrie Myers  Procedure(s) Performed: COLONOSCOPY WITH BIOPSIES (N/A Rectum)  Patient Location: PACU  Anesthesia Type: General  Level of Consciousness: awake, alert  and patient cooperative  Airway and Oxygen Therapy: Patient Spontanous Breathing and Patient connected to supplemental oxygen  Post-op Assessment: Post-op Vital signs reviewed, Patient's Cardiovascular Status Stable, Respiratory Function Stable, Patent Airway and No signs of Nausea or vomiting  Post-op Vital Signs: Reviewed and stable  Complications: No apparent anesthesia complications

## 2018-04-03 NOTE — Anesthesia Procedure Notes (Signed)
Performed by: Azlin Zilberman, CRNA Pre-anesthesia Checklist: Patient identified, Emergency Drugs available, Suction available, Timeout performed and Patient being monitored Patient Re-evaluated:Patient Re-evaluated prior to induction Oxygen Delivery Method: Nasal cannula Placement Confirmation: positive ETCO2       

## 2018-04-03 NOTE — Anesthesia Preprocedure Evaluation (Signed)
Anesthesia Evaluation  Patient identified by MRN, date of birth, ID band Patient awake    Reviewed: Allergy & Precautions, H&P , NPO status , Patient's Chart, lab work & pertinent test results  Airway Mallampati: II  TM Distance: >3 FB Neck ROM: full    Dental  (+) Missing   Pulmonary sleep apnea and Continuous Positive Airway Pressure Ventilation , former smoker,    Pulmonary exam normal breath sounds clear to auscultation       Cardiovascular hypertension, + CAD, + Past MI and + Cardiac Stents  Normal cardiovascular exam Rhythm:regular Rate:Normal     Neuro/Psych  Neuromuscular disease    GI/Hepatic GERD  ,  Endo/Other  Hypothyroidism   Renal/GU      Musculoskeletal   Abdominal   Peds  Hematology   Anesthesia Other Findings   Reproductive/Obstetrics                             Anesthesia Physical Anesthesia Plan  ASA: III  Anesthesia Plan: General   Post-op Pain Management:    Induction: Intravenous  PONV Risk Score and Plan: 3 and Propofol infusion and Treatment may vary due to age or medical condition  Airway Management Planned: Natural Airway  Additional Equipment:   Intra-op Plan:   Post-operative Plan:   Informed Consent: I have reviewed the patients History and Physical, chart, labs and discussed the procedure including the risks, benefits and alternatives for the proposed anesthesia with the patient or authorized representative who has indicated his/her understanding and acceptance.       Plan Discussed with: CRNA  Anesthesia Plan Comments:         Anesthesia Quick Evaluation

## 2018-04-03 NOTE — Anesthesia Postprocedure Evaluation (Signed)
Anesthesia Post Note  Patient: Carrie Myers  Procedure(s) Performed: COLONOSCOPY WITH BIOPSIES (N/A Rectum)  Patient location during evaluation: PACU Anesthesia Type: General Level of consciousness: awake and alert and oriented Pain management: satisfactory to patient Vital Signs Assessment: post-procedure vital signs reviewed and stable Respiratory status: spontaneous breathing, nonlabored ventilation and respiratory function stable Cardiovascular status: blood pressure returned to baseline and stable Postop Assessment: Adequate PO intake and No signs of nausea or vomiting Anesthetic complications: no    Raliegh Ip

## 2018-04-03 NOTE — H&P (Signed)
Lucilla Lame, MD [facility] [address], [address], [place] [address] [phone] Fax : [phone]  Primary Care Physician:  [name], MD Primary Gastroenterologist:  Dr. Allen Norris  Pre-Procedure History & Physical: HPI:  Carrie Myers is a 73 y.o. female is here for an colonoscopy.   Past Medical History:  Diagnosis Date  . Allergy   . Anxiety   . Arthritis    neck, hands, lower back  . Bilateral dry eyes   . Chronic low back pain 12/23/2016  . DDD (degenerative disc disease), lumbar 10/02/2014  . Dental crowns present    Implants, front "flipper" while waiting for other implants  . Fibromyalgia   . Fracture of right foot 01/14/2015  . GERD (gastroesophageal reflux disease)   . Hyperlipidemia   . Hypertension   . Kidney stones   . Myocardial infarction ([place]) 04/22/2006  . Osteoarthritis   . Other shoulder lesions, right shoulder 04/17/2015  . Rheumatoid arthritis ([place])   . Sleep apnea    CPAP  . Status post shoulder surgery 05/27/2015  . Thyroid disease   . Vertigo    none for approx 9 yrs    Past Surgical History:  Procedure Laterality Date  . ABDOMINAL HYSTERECTOMY     total  . APPENDECTOMY    . BREAST BIOPSY Right    neg  . BREAST CYST ASPIRATION Left    neg  . BUNIONECTOMY Bilateral   . CESAREAN SECTION    . COLONOSCOPY  2010   normal  . CORONARY ANGIOPLASTY WITH STENT PLACEMENT  04/2006  . dental implant     [age]  . epidural steroid injection  2018  . SHOULDER ARTHROSCOPY Right 05/07/2015   Procedure: Extensive arthroscopic debridement and arthroscopic subacromial decompression, right shoulder.  2. Steroid injection right thumb CMC joint.;  Surgeon: Corky Mull, MD;  Location: [facility];  Service: Orthopedics;  Laterality: Right;  . TOOTH EXTRACTION     Upper centrals and lateral    Prior to Admission medications   Medication Sig Start Date End Date Taking? Authorizing Provider  aspirin EC 81 MG  tablet Take 81 mg by mouth daily.   Yes [provider]  benzonatate (TESSALON) 100 MG capsule Take 1 capsule (100 mg total) by mouth 3 (three) times daily. 03/20/18  Yes [name], MD  diazepam (VALIUM) 5 MG tablet Limit  1/2  tablet by mouth per day or every other day if tolerated 03/14/18  Yes Molli Barrows, MD  Diclofenac-miSOPROStol 75-0.2 MG TBEC TAKE 1 TABLET BY MOUTH TWO  TIMES DAILY 02/17/18  Yes [name], MD  DULoxetine (CYMBALTA) 60 MG capsule Take 1 capsule (60 mg total) by mouth daily. 02/06/18  Yes [name], MD  esomeprazole (NEXIUM) 20 MG capsule Take 40 mg by mouth daily. Over the Counter   Yes [provider]  HYDROcodone-acetaminophen (NORCO/VICODIN) 5-325 MG tablet Take 1 tablet by mouth at bedtime. Limit 1-2 tablets by mouth per day if tolerated 04/13/18 05/13/18 Yes Molli Barrows, MD  levothyroxine (SYNTHROID, LEVOTHROID) 88 MCG tablet TAKE 1 TABLET BY MOUTH  DAILY 02/21/17  Yes [name], MD  Loperamide HCl (IMODIUM PO) Take by mouth as needed.   Yes [provider]  losartan (COZAAR) 50 MG tablet Take 50 mg by mouth daily.   Yes [provider]  Multiple Vitamin (MULTI-VITAMINS) TABS Take by mouth.   Yes [provider]  NON FORMULARY  Yes [provider]  pravastatin (PRAVACHOL) 80 MG tablet Take 80 mg by mouth daily.   Yes [provider]  traMADol (ULTRAM) 50 MG tablet Take 1 tablet (50 mg total) by mouth every 6 (six) hours as needed. 03/14/18  Yes Molli Barrows, MD  cyclobenzaprine (FLEXERIL) 10 MG tablet TAKE 1 TABLET BY MOUTH 3  TIMES DAILY Patient not taking: TAKE 1 TABLET BY MOUTH 3&nbsp;&nbsp;TIMES DAILY 12/02/17   [name], MD  tolterodine (DETROL LA) 4 MG 24 hr capsule TAKE 1 CAPSULE(4 MG) BY MOUTH DAILY Patient not taking: TAKE 1 CAPSULE(4 MG) BY MOUTH DAILY 02/06/18   Gae Dry, MD    Allergies as of 03/20/2018 - Review Complete 03/20/2018  Allergen  Reaction Noted  . Augmentin [amoxicillin-pot clavulanate] Nausea And Vomiting 10/02/2014  . Codeine Nausea And Vomiting 10/02/2014  . Sulfa antibiotics Nausea And Vomiting 10/02/2014  . Proparacaine Itching 09/28/2017  . Shellfish allergy Nausea And Vomiting, Nausea Only, and Other (See Comments) 10/02/2014    Family History  Problem Relation Age of Onset  . Stroke Mother   . Heart disease Father   . Pancreatic cancer Sister   . Diabetes Sister   . Prostate cancer Brother   . Breast cancer Neg Hx   . Kidney cancer Neg Hx   . Bladder Cancer Neg Hx     Social History   Socioeconomic History  . Marital status: Widowed    Spouse name: Not on file  . Number of children: 1  . Years of education: Not on file  . Highest education level: Not on file  Occupational History  . Not on file  Social Needs  . Financial resource strain: Not hard at all  . Food insecurity:    Worry: Never true    Inability: Never true  . Transportation needs:    Medical: No    Non-medical: No  Tobacco Use  . Smoking status: Former Smoker    Packs/day: 1.25    Years: 44.00    Pack years: 55.00    Types: Cigarettes    Last attempt to quit: 04/22/2006    Years since quitting: 11.9  . Smokeless tobacco: Never Used  . Tobacco comment:    Substance and Sexual Activity  . Alcohol use: No    Alcohol/week: 0.0 standard drinks  . Drug use: No  . Sexual activity: Not on file  Lifestyle  . Physical activity:    Days per week: 0 days    Minutes per session: 0 min  . Stress: Not at all  Relationships  . Social connections:    Talks on phone: More than three times a week    Gets together: Twice a week    Attends religious service: More than 4 times per year    Active member of club or organization: Yes    Attends meetings of clubs or organizations: Never    Relationship status: Widowed  . Intimate partner violence:    Fear of current or ex partner: No    Emotionally abused: No    Physically abused:  No    Forced sexual activity: No  Other Topics Concern  . Not on file  Social History Narrative  . Not on file    Review of Systems: See HPI, otherwise negative ROS  Physical Exam: BP (!) 149/91   Pulse (!) 102   Temp 97.7 F (36.5 C) (Temporal)   Ht 5\' 8"  (1.727 m)   Wt 79.4 kg  SpO2 95%   BMI 26.61 kg/m  General:   Alert,  pleasant and cooperative in NAD Head:  Normocephalic and atraumatic. Neck:  Supple; no masses or thyromegaly. Lungs:  Clear throughout to auscultation.    Heart:  Regular rate and rhythm. Abdomen:  Soft, nontender and nondistended. Normal bowel sounds, without guarding, and without rebound.   Neurologic:  Alert and  oriented x4;  grossly normal neurologically.  Impression/Plan: Carrie Myers is here for an colonoscopy to be performed for change in bowel habits  Risks, benefits, limitations, and alternatives regarding  colonoscopy have been reviewed with the patient.  Questions have been answered.  All parties agreeable.   Lucilla Lame, MD  04/03/2018, 9:41 AM

## 2018-04-03 NOTE — Op Note (Signed)
[facility] Gastroenterology Patient Name: Carrie Myers Procedure Date: 04/03/2018 9:40 AM MRN: [id number] Account #: [id number] Date of Birth: [birth year] Admit Type: Outpatient Age: 73 Room: [facility] OR ROOM 01 Gender: Female Note Status: Finalized Procedure:            Colonoscopy Indications:          Change in bowel habits, Change in stool caliber Providers:            Lucilla Lame MD, MD Referring MD:         Halina Maidens, MD (Referring MD) Medicines:            Propofol per Anesthesia Complications:        No immediate complications. Procedure:            Pre-Anesthesia Assessment:                       - Prior to the procedure, a History and Physical was                        performed, and patient medications and allergies were                        reviewed. The patient's tolerance of previous                        anesthesia was also reviewed. The risks and benefits of                        the procedure and the sedation options and risks were                        discussed with the patient. All questions were                        answered, and informed consent was obtained. Prior                        Anticoagulants: The patient has taken no previous                        anticoagulant or antiplatelet agents. ASA Grade                        Assessment: II - A patient with mild systemic disease.                        After reviewing the risks and benefits, the patient was                        deemed in satisfactory condition to undergo the                        procedure.                       After obtaining informed consent, the colonoscope was                        passed under direct vision. Throughout the procedure,  the patient's blood pressure, pulse, and oxygen                        saturations were monitored continuously. The was                        introduced through the anus and advanced to the the                        terminal ileum. The colonoscopy was performed without                        difficulty. The patient tolerated the procedure well.                        The quality of the bowel preparation was excellent. Findings:      The perianal and digital rectal examinations were normal.      The terminal ileum appeared normal. This was biopsied with a cold       forceps for histology.      Multiple small-mouthed diverticula were found in the sigmoid colon.      Random biopsies were obtained with cold forceps for histology randomly       in the entire colon. Impression:           - The examined portion of the ileum was normal.                        Biopsied.                       - Diverticulosis in the sigmoid colon.                       - Random biopsies were obtained in the entire colon. Recommendation:       - Discharge patient to home.                       - Resume previous diet.                       - Continue present medications.                       - Await pathology results. Procedure Code(s):    --- Professional ---                       [phone], Colonoscopy, flexible; with biopsy, single or                        multiple Diagnosis Code(s):    --- Professional ---                       R19.4, Change in bowel habit                       R19.5, Other fecal abnormalities CPT copyright 2018 American Medical Association. All rights reserved. The codes documented in this report are preliminary and upon coder review may  be revised to meet current compliance requirements. Lucilla Lame MD, MD 04/03/2018 10:03:43 AM This report has been signed electronically. Number of Addenda: 0 Note  Initiated On: 04/03/2018 9:40 AM Scope Withdrawal Time: 0 hours 9 minutes 45 seconds  Total Procedure Duration: 0 hours 12 minutes 15 seconds       [facility]

## 2018-04-04 ENCOUNTER — Encounter: Payer: Self-pay | Admitting: Gastroenterology

## 2018-04-06 ENCOUNTER — Encounter: Payer: Self-pay | Admitting: Gastroenterology

## 2018-04-17 ENCOUNTER — Ambulatory Visit (INDEPENDENT_AMBULATORY_CARE_PROVIDER_SITE_OTHER): Payer: Medicare Other

## 2018-04-17 VITALS — BP 148/98 | HR 94 | Temp 98.2°F | Resp 17 | Ht 68.0 in | Wt 177.8 lb

## 2018-04-17 DIAGNOSIS — Z0000 Encounter for general adult medical examination without abnormal findings: Secondary | ICD-10-CM | POA: Diagnosis not present

## 2018-04-17 NOTE — Progress Notes (Signed)
Subjective:   Carrie Myers is a 73 y.o. female who presents for Medicare Annual (Subsequent) preventive examination.  Review of Systems:   Cardiac Risk Factors include: advanced age (>82men, >69 women);dyslipidemia;hypertension     Objective:     Vitals: Pulse 94   Temp 98.2 F (36.8 C) (Oral)   Resp 17   Ht 5\' 8"  (1.727 m)   Wt 177 lb 12.8 oz (80.6 kg)   SpO2 98%   BMI 27.03 kg/m   Body mass index is 27.03 kg/m.  Advanced Directives 04/17/2018 04/03/2018 12/11/2017 12/06/2017 12/22/2016 11/17/2016 09/07/2016  Does Patient Have a Medical Advance Directive? Yes Yes Yes Yes Yes Yes Yes  Type of Paramedic of [place];Living will [place];Living will Living will;Healthcare Power of [place];Living will - - [place];Living will  Does patient want to make changes to medical advance directive? - - - No - Patient declined - - -  Copy of [place] in Chart? Yes - validated most recent copy scanned in chart (See row information) Yes - validated most recent copy scanned in chart (See row information) No - copy requested No - copy requested - - No - copy requested    Tobacco Social History   Tobacco Use  Smoking Status Former Smoker  . Packs/day: 1.25  . Years: 44.00  . Pack years: 55.00  . Types: Cigarettes  . Last attempt to quit: 04/22/2006  . Years since quitting: 11.9  Smokeless Tobacco Never Used  Tobacco Comment         Counseling given: Not Answered Comment:     Clinical Intake:  Pre-visit preparation completed: Yes  Pain : 0-10 Pain Score: 4  Pain Type: Chronic pain Pain Location: (fibromyalgia) Pain Descriptors / Indicators: Aching Pain Onset: More than a month ago Pain Frequency: Constant     Nutritional Status: BMI 25 -29 Overweight Nutritional Risks: Nausea/ vomitting/ diarrhea(diarrhea, soft/watery stools, nausea 2 nights ago) Diabetes:  No  How often do you need to have someone help you when you read instructions, pamphlets, or other written materials from your doctor or pharmacy?: 1 - Never What is the last grade level you completed in school?: 12th grade  Interpreter Needed?: No  Information entered by :: Clemetine Marker LPN  Past Medical History:  Diagnosis Date  . Allergy   . Anxiety   . Arthritis    neck, hands, lower back  . Bilateral dry eyes   . Chronic low back pain 12/23/2016  . DDD (degenerative disc disease), lumbar 10/02/2014  . Dental crowns present    Implants, front "flipper" while waiting for other implants  . Fibromyalgia   . Fibromyalgia   . Fracture of right foot 01/14/2015  . GERD (gastroesophageal reflux disease)   . Hyperlipidemia   . Hypertension   . Kidney stones   . Myocardial infarction ([place]) 04/22/2006  . Osteoarthritis   . Other shoulder lesions, right shoulder 04/17/2015  . Rheumatoid arthritis ([place])   . Sleep apnea    CPAP  . Status post shoulder surgery 05/27/2015  . Thyroid disease   . Vertigo    none for approx 9 yrs   Past Surgical History:  Procedure Laterality Date  . ABDOMINAL HYSTERECTOMY     total  . APPENDECTOMY    . BREAST BIOPSY Right    neg  . BREAST CYST ASPIRATION Left    neg  . BUNIONECTOMY Bilateral   .  CESAREAN SECTION    . COLONOSCOPY  2010   normal  . COLONOSCOPY WITH PROPOFOL N/A 04/03/2018   Procedure: COLONOSCOPY WITH BIOPSIES;  Surgeon: Lucilla Lame, MD;  Location: [facility];  Service: Endoscopy;  Laterality: N/A;  sleep apnea  . CORONARY ANGIOPLASTY WITH STENT PLACEMENT  04/2006  . dental implant     [age]  . epidural steroid injection  2018  . SHOULDER ARTHROSCOPY Right 05/07/2015   Procedure: Extensive arthroscopic debridement and arthroscopic subacromial decompression, right shoulder.  2. Steroid injection right thumb CMC joint.;  Surgeon: Corky Mull, MD;  Location: [facility];  Service: Orthopedics;   Laterality: Right;  . TOOTH EXTRACTION     Upper centrals and lateral   Family History  Problem Relation Age of Onset  . Stroke Mother   . Heart disease Father   . Pancreatic cancer Sister   . Diabetes Sister   . Prostate cancer Brother   . Breast cancer Neg Hx   . Kidney cancer Neg Hx   . Bladder Cancer Neg Hx    Social History   Socioeconomic History  . Marital status: Widowed    Spouse name: Not on file  . Number of children: 1  . Years of education: Not on file  . Highest education level: High school graduate  Occupational History  . Occupation: retired   Scientific laboratory technician  . Financial resource strain: Not hard at all  . Food insecurity:    Worry: Never true    Inability: Never true  . Transportation needs:    Medical: No    Non-medical: No  Tobacco Use  . Smoking status: Former Smoker    Packs/day: 1.25    Years: 44.00    Pack years: 55.00    Types: Cigarettes    Last attempt to quit: 04/22/2006    Years since quitting: 11.9  . Smokeless tobacco: Never Used  . Tobacco comment:    Substance and Sexual Activity  . Alcohol use: No    Alcohol/week: 0.0 standard drinks  . Drug use: No  . Sexual activity: Not Currently  Lifestyle  . Physical activity:    Days per week: 0 days    Minutes per session: 0 min  . Stress: Not at all  Relationships  . Social connections:    Talks on phone: More than three times a week    Gets together: Twice a week    Attends religious service: More than 4 times per year    Active member of club or organization: Yes    Attends meetings of clubs or organizations: Never    Relationship status: Widowed  Other Topics Concern  . Not on file  Social History Narrative  . Not on file    Outpatient Encounter Medications as of 04/17/2018  Medication Sig  . aspirin EC 81 MG tablet Take 81 mg by mouth daily.  . benzonatate (TESSALON) 100 MG capsule Take 1 capsule (100 mg total) by mouth 3 (three) times daily.  . citalopram (CELEXA) 20 MG  tablet Take 20 mg by mouth daily.   . diazepam (VALIUM) 5 MG tablet Limit  1/2  tablet by mouth per day or every other day if tolerated  . Diclofenac-miSOPROStol 75-0.2 MG TBEC TAKE 1 TABLET BY MOUTH TWO  TIMES DAILY  . DULoxetine (CYMBALTA) 60 MG capsule Take 1 capsule (60 mg total) by mouth daily.  Marland Kitchen esomeprazole (NEXIUM) 20 MG capsule Take 40 mg by mouth daily. Over the Counter  .  HYDROcodone-acetaminophen (NORCO/VICODIN) 5-325 MG tablet Take 1 tablet by mouth at bedtime. Limit 1-2 tablets by mouth per day if tolerated  . levothyroxine (SYNTHROID, LEVOTHROID) 88 MCG tablet TAKE 1 TABLET BY MOUTH  DAILY  . Loperamide HCl (IMODIUM PO) Take by mouth as needed.  Marland Kitchen losartan (COZAAR) 50 MG tablet Take 50 mg by mouth daily.  . Multiple Vitamin (MULTI-VITAMINS) TABS Take by mouth.  . NON FORMULARY   . pravastatin (PRAVACHOL) 80 MG tablet Take 80 mg by mouth daily.  . traMADol (ULTRAM) 50 MG tablet Take 1 tablet (50 mg total) by mouth every 6 (six) hours as needed.  . cyclobenzaprine (FLEXERIL) 10 MG tablet TAKE 1 TABLET BY MOUTH 3  TIMES DAILY (Patient not taking: TAKE 1 TABLET BY MOUTH 3  TIMES DAILY)  . tolterodine (DETROL LA) 4 MG 24 hr capsule TAKE 1 CAPSULE(4 MG) BY MOUTH DAILY (Patient not taking: TAKE 1 CAPSULE(4 MG) BY MOUTH DAILY)   No facility-administered encounter medications on file as of 04/17/2018.     Activities of Daily Living In your present state of health, do you have any difficulty performing the following activities: 04/17/2018 04/03/2018  Hearing? N N  Comment declines hearing aids -  Vision? N N  Comment wears glasses -  Difficulty concentrating or making decisions? N N  Walking or climbing stairs? N N  Dressing or bathing? N N  Doing errands, shopping? N -  Preparing Food and eating ? N -  Using the Toilet? N -  In the past six months, have you accidently leaked urine? Y -  Comment wears pads for protection -  Do you have problems with loss of bowel control? N -    Managing your Medications? N -  Managing your Finances? N -  Housekeeping or managing your Housekeeping? N -  Some recent data might be hidden    Patient Care Team: [name], MD as PCP - General (Internal Medicine) Corey Skains, MD as Consulting Physician (Cardiology) Mohammed Kindle, MD as Attending Physician (Pain Medicine) Molli Barrows, MD as Consulting Physician (Pain Medicine) Laneta Simmers as Physician Assistant (Urology) Ellwood Handler., MD (Gastroenterology)    Assessment:   This is a routine wellness examination for [place].  Exercise Activities and Dietary recommendations Current Exercise Habits: The patient does not participate in regular exercise at present, Exercise limited by: cardiac condition(s);respiratory conditions(s)  Goals    . Increase physical activity     Increase physical activity to 150 minutes per week.     . Weight (lb) < 200 lb (90.7 kg)       Fall Risk Fall Risk  04/17/2018 03/20/2018 03/14/2018 12/20/2017 12/05/2017  Falls in the past year? 0 0 0 No No  Comment - - - - -  Number falls in past yr: 0 0 - - -  Comment - - - - -  Injury with Fall? 0 0 - - -  Risk for fall due to : - - - - -  Follow up Falls prevention discussed Falls evaluation completed - - -   FALL RISK PREVENTION PERTAINING TO THE HOME:   Any stairs in or around the home WITH handrails? No  Home free of loose throw rugs in walkways, pet beds, electrical cords, etc? Yes  Adequate lighting in your home to reduce risk of falls? Yes   ASSISTIVE DEVICES UTILIZED TO PREVENT FALLS:  Life alert? No  Use of a cane, walker or w/c? Yes  occasional use of cane with walking uphill Grab bars in the bathroom? Yes  Shower chair or bench in shower? No  Elevated toilet seat or a handicapped toilet? No   DME ORDERS:  DME order needed?  No   TIMED UP AND GO:  Was the test performed? Yes .  Length of time to ambulate 10 feet: 6 sec.   GAIT:  Appearance of  gait: Gait stead-fast and without the use of an assistive device.   Education: Fall risk prevention has been discussed.  Intervention(s) required? No   Depression Screen PHQ 2/9 Scores 04/17/2018 03/20/2018 03/20/2018 03/14/2018  PHQ - 2 Score 0 0 0 0  PHQ- 9 Score - 0 - -  Exception Documentation - - - -     Cognitive Function     6CIT Screen 04/17/2018 03/18/2017 03/16/2016  What Year? 0 points 0 points 0 points  What month? 0 points 0 points 0 points  What time? 0 points 0 points 0 points  Count back from 20 0 points 0 points 0 points  Months in reverse 0 points 0 points 0 points  Repeat phrase 0 points 2 points 0 points  Total Score 0 2 0    Immunization History  Administered Date(s) Administered  . Influenza, High Dose Seasonal PF 12/31/2016, 12/05/2017  . Pneumococcal Conjugate-13 01/30/2014  . Pneumococcal Polysaccharide-23 09/22/2012  . Tdap 09/08/2011    Qualifies for Shingles Vaccine? Yes  Due for Shingrix. Education has been provided regarding the importance of this vaccine. Pt has been advised to call insurance company to determine out of pocket expense. Advised may also receive vaccine at local pharmacy or Health Dept. Verbalized acceptance and understanding.  Tdap: Up to date  Flu Vaccine: Up to date  Pneumococcal Vaccine: Up to date   Screening Tests Health Maintenance  Topic Date Due  . DEXA SCAN  04/26/2012  . MAMMOGRAM  03/23/2017  . TETANUS/TDAP  09/07/2021  . COLONOSCOPY  04/03/2028  . INFLUENZA VACCINE  Completed  . Hepatitis C Screening  Completed  . PNA vac Low Risk Adult  Completed   Cancer Screenings:  Colorectal Screening: Completed 03/28/18. Repeat every 10 years;   Mammogram: Completed 03/23/16. Repeat every year;  Ordered 01/16/18. Pt provided with contact information and advised to call to schedule appt.   Bone Density: due - scheduled for 05/01/18  Lung Cancer Screening: (Low Dose CT Chest recommended if Age 7-80 years, 30 pack-year currently  smoking OR have quit w/in 15years.) does qualify. Lung cancer screening completed 01/2018  Additional Screening:  Hepatitis C Screening: does qualify; Completed 03/16/16  Vision Screening: Recommended annual ophthalmology exams for early detection of glaucoma and other disorders of the eye. Is the patient up to date with their annual eye exam?  Yes  Who is the provider or what is the name of the office in which the pt attends annual eye exams? [facility]  Dental Screening: Recommended annual dental exams for proper oral hygiene  Community Resource Referral:  CRR required this visit?  No      Plan:      I have personally reviewed and addressed the Medicare Annual Wellness questionnaire and have noted the following in the patient's chart:  A. Medical and social history B. Use of alcohol, tobacco or illicit drugs  C. Current medications and supplements D. Functional ability and status E.  Nutritional status F.  Physical activity G. Advance directives H. List of other physicians I.  Hospitalizations, surgeries, and ER visits  in previous 12 months J.  [place] such as hearing and vision if needed, cognitive and depression L. Referrals and appointments   In addition, I have reviewed and discussed with patient certain preventive protocols, quality metrics, and best practice recommendations. A written personalized care plan for preventive services as well as general preventive health recommendations were provided to patient.   Signed,  Clemetine Marker, LPN Nurse Health Advisor   Nurse Notes: Pt c/o head cold with clear drainage and dry cough. She also had colonoscopy 03/28/18 and still having issues with loose, watery stools. She plans to increase fiber in her diet to see if bowel patterns improve. Pt's BP elevated today at 148/98 - she has not taken her blood pressure medication today, advised to take medication regularly and check blood pressure at least weekly.

## 2018-04-17 NOTE — Patient Instructions (Signed)
Carrie Myers , Thank you for taking time to come for your Medicare Wellness Visit. I appreciate your ongoing commitment to your health goals. Please review the following plan we discussed and let me know if I can assist you in the future.   Screening recommendations/referrals: Colonoscopy: completed 03/28/18 Mammogram: completed 03/23/16. Please call [phone] to schedule your mammogram.  Bone Density: scheduled for 05/01/18 Recommended yearly ophthalmology/optometry visit for glaucoma screening and checkup Recommended yearly dental visit for hygiene and checkup  Vaccinations: Influenza vaccine: done 12/05/17 Pneumococcal vaccine: done 01/30/14 Tdap vaccine: done 09/08/11 Shingles vaccine: Shingrix discussed. Please contact your pharmacy for coverage information.     Conditions/risks identified: Recommend increasing physical activity to 150 minutes per week.   Next appointment: Please follow up in one year for your Medicare Annual Wellness visit.     Preventive Care 71 Years and Older, Female Preventive care refers to lifestyle choices and visits with your health care provider that can promote health and wellness. What does preventive care include?  A yearly physical exam. This is also called an annual well check.  Dental exams once or twice a year.  Routine eye exams. Ask your health care provider how often you should have your eyes checked.  Personal lifestyle choices, including:  Daily care of your teeth and gums.  Regular physical activity.  Eating a healthy diet.  Avoiding tobacco and drug use.  Limiting alcohol use.  Practicing safe sex.  Taking low-dose aspirin every day.  Taking vitamin and mineral supplements as recommended by your health care provider. What happens during an annual well check? The services and screenings done by your health care provider during your annual well check will depend on your age, overall health, lifestyle risk factors, and family  history of disease. Counseling  Your health care provider may ask you questions about your:  Alcohol use.  Tobacco use.  Drug use.  Emotional well-being.  Home and relationship well-being.  Sexual activity.  Eating habits.  History of falls.  Memory and ability to understand (cognition).  Work and work Statistician.  Reproductive health. Screening  You may have the following tests or measurements:  Height, weight, and BMI.  Blood pressure.  Lipid and cholesterol levels. These may be checked every 5 years, or more frequently if you are over 86 years old.  Skin check.  Lung cancer screening. You may have this screening every year starting at age 68 if you have a 30-pack-year history of smoking and currently smoke or have quit within the past 15 years.  Fecal occult blood test (FOBT) of the stool. You may have this test every year starting at age 42.  Flexible sigmoidoscopy or colonoscopy. You may have a sigmoidoscopy every 5 years or a colonoscopy every 10 years starting at age 66.  Hepatitis C blood test.  Hepatitis B blood test.  Sexually transmitted disease (STD) testing.  Diabetes screening. This is done by checking your blood sugar (glucose) after you have not eaten for a while (fasting). You may have this done every 1-3 years.  Bone density scan. This is done to screen for osteoporosis. You may have this done starting at age 6.  Mammogram. This may be done every 1-2 years. Talk to your health care provider about how often you should have regular mammograms. Talk with your health care provider about your test results, treatment options, and if necessary, the need for more tests. Vaccines  Your health care provider may recommend certain vaccines, such as:  Influenza vaccine. This is recommended every year.  Tetanus, diphtheria, and acellular pertussis (Tdap, Td) vaccine. You may need a Td booster every 10 years.  Zoster vaccine. You may need this after  age 76.  Pneumococcal 13-valent conjugate (PCV13) vaccine. One dose is recommended after age 1.  Pneumococcal polysaccharide (PPSV23) vaccine. One dose is recommended after age 64. Talk to your health care provider about which screenings and vaccines you need and how often you need them. This information is not intended to replace advice given to you by your health care provider. Make sure you discuss any questions you have with your health care provider. Document Released: 03/28/2015 Document Revised: 11/19/2015 Document Reviewed: 12/31/2014 Elsevier Interactive Patient Education  2017 [employer] Prevention in the Home Falls can cause injuries. They can happen to people of all ages. There are many things you can do to make your home safe and to help prevent falls. What can I do on the outside of my home?  Regularly fix the edges of walkways and driveways and fix any cracks.  Remove anything that might make you trip as you walk through a door, such as a raised step or threshold.  Trim any bushes or trees on the path to your home.  Use bright outdoor lighting.  Clear any walking paths of anything that might make someone trip, such as rocks or tools.  Regularly check to see if handrails are loose or broken. Make sure that both sides of any steps have handrails.  Any raised decks and porches should have guardrails on the edges.  Have any leaves, snow, or ice cleared regularly.  Use sand or salt on walking paths during winter.  Clean up any spills in your garage right away. This includes oil or grease spills. What can I do in the bathroom?  Use night lights.  Install grab bars by the toilet and in the tub and shower. Do not use towel bars as grab bars.  Use non-skid mats or decals in the tub or shower.  If you need to sit down in the shower, use a plastic, non-slip stool.  Keep the floor dry. Clean up any water that spills on the floor as soon as it  happens.  Remove soap buildup in the tub or shower regularly.  Attach bath mats securely with double-sided non-slip rug tape.  Do not have throw rugs and other things on the floor that can make you trip. What can I do in the bedroom?  Use night lights.  Make sure that you have a light by your bed that is easy to reach.  Do not use any sheets or blankets that are too big for your bed. They should not hang down onto the floor.  Have a firm chair that has side arms. You can use this for support while you get dressed.  Do not have throw rugs and other things on the floor that can make you trip. What can I do in the kitchen?  Clean up any spills right away.  Avoid walking on wet floors.  Keep items that you use a lot in easy-to-reach places.  If you need to reach something above you, use a strong step stool that has a grab bar.  Keep electrical cords out of the way.  Do not use floor polish or wax that makes floors slippery. If you must use wax, use non-skid floor wax.  Do not have throw rugs and other things on the floor that  can make you trip. What can I do with my stairs?  Do not leave any items on the stairs.  Make sure that there are handrails on both sides of the stairs and use them. Fix handrails that are broken or loose. Make sure that handrails are as long as the stairways.  Check any carpeting to make sure that it is firmly attached to the stairs. Fix any carpet that is loose or worn.  Avoid having throw rugs at the top or bottom of the stairs. If you do have throw rugs, attach them to the floor with carpet tape.  Make sure that you have a light switch at the top of the stairs and the bottom of the stairs. If you do not have them, ask someone to add them for you. What else can I do to help prevent falls?  Wear shoes that:  Do not have high heels.  Have rubber bottoms.  Are comfortable and fit you well.  Are closed at the toe. Do not wear sandals.  If you  use a stepladder:  Make sure that it is fully opened. Do not climb a closed stepladder.  Make sure that both sides of the stepladder are locked into place.  Ask someone to hold it for you, if possible.  Clearly mark and make sure that you can see:  Any grab bars or handrails.  First and last steps.  Where the edge of each step is.  Use tools that help you move around (mobility aids) if they are needed. These include:  Canes.  Walkers.  Scooters.  Crutches.  Turn on the lights when you go into a dark area. Replace any light bulbs as soon as they burn out.  Set up your furniture so you have a clear path. Avoid moving your furniture around.  If any of your floors are uneven, fix them.  If there are any pets around you, be aware of where they are.  Review your medicines with your doctor. Some medicines can make you feel dizzy. This can increase your chance of falling. Ask your doctor what other things that you can do to help prevent falls. This information is not intended to replace advice given to you by your health care provider. Make sure you discuss any questions you have with your health care provider. Document Released: 12/26/2008 Document Revised: 08/07/2015 Document Reviewed: 04/05/2014 Elsevier Interactive Patient Education  2017 [employer].

## 2018-04-25 ENCOUNTER — Other Ambulatory Visit: Payer: Self-pay

## 2018-04-25 ENCOUNTER — Ambulatory Visit (INDEPENDENT_AMBULATORY_CARE_PROVIDER_SITE_OTHER): Payer: Medicare Other | Admitting: Internal Medicine

## 2018-04-25 ENCOUNTER — Encounter: Payer: Self-pay | Admitting: Internal Medicine

## 2018-04-25 VITALS — BP 132/64 | HR 93 | Ht 68.0 in | Wt 181.0 lb

## 2018-04-25 DIAGNOSIS — R194 Change in bowel habit: Secondary | ICD-10-CM

## 2018-04-25 DIAGNOSIS — K5730 Diverticulosis of large intestine without perforation or abscess without bleeding: Secondary | ICD-10-CM

## 2018-04-25 DIAGNOSIS — R0981 Nasal congestion: Secondary | ICD-10-CM | POA: Diagnosis not present

## 2018-04-25 NOTE — Progress Notes (Signed)
Date:  04/25/2018   Name:  Carrie Myers   DOB:  November 18, 1945   MRN:  [id number]   Chief Complaint: Diarrhea (Tried Benefiber for 4 days. Improved but not done. Tried again and taken for 4 days today. Having the issue of no BM since saturday now. )  Diarrhea   The current episode started more than 1 month ago. The problem occurs 2 to 4 times per day. The problem has been gradually improving. The stool consistency is described as watery. The patient states that diarrhea does not awaken her from sleep. Pertinent negatives include no abdominal pain, bloating, chills, coughing, fever, headaches, vomiting or weight loss. There are no known risk factors. Treatments tried: metronidazole for presumed diverticulitis and recently fiber supplement. recent colonoscopy was normal except for tics  Sinus Problem  This is a recurrent problem. There has been no fever. Associated symptoms include sinus pressure. Pertinent negatives include no chills, coughing, headaches or shortness of breath. Past treatments include oral decongestants and spray decongestants. The treatment provided mild relief.    Review of Systems  Constitutional: Negative for chills, fever and weight loss.  HENT: Positive for sinus pressure. Negative for sinus pain and trouble swallowing.   Respiratory: Negative for cough, chest tightness, shortness of breath and wheezing.   Cardiovascular: Negative for palpitations and leg swelling.  Gastrointestinal: Positive for constipation. Negative for abdominal pain, bloating, blood in stool, diarrhea and vomiting.  Neurological: Negative for dizziness and headaches.    Patient Active Problem List   Diagnosis Date Noted  . Diverticulosis of colon 04/25/2018  . Change in bowel habits   . Abnormal feces   . Tobacco use disorder, moderate, in sustained remission 12/22/2017  . Moderate episode of recurrent major depressive disorder ([place]) 10/03/2017  . Inflammatory spondylopathy of lumbar  region ([place]) 10/03/2017  . Hoarseness, persistent 04/18/2017  . OSA on CPAP 02/18/2017  . Cervical disc disease 12/31/2016  . Generalized osteoarthritis 12/23/2016  . Chronic pain syndrome 12/23/2016  . Cystocele, midline 12/06/2016  . Urinary incontinence 12/06/2016  . Impingement syndrome of right shoulder 04/17/2015  . Fibrocystic breast 01/18/2015  . Gastroesophageal reflux disease without esophagitis 01/18/2015  . Hypothyroidism, postablative 01/18/2015  . Arteriosclerosis of coronary artery 12/29/2014  . DJD of shoulder 11/13/2014  . Fibromyalgia 10/02/2014  . Intercostal neuralgia 10/02/2014  . Facet syndrome, lumbar 10/02/2014  . Sacroiliac joint disease 10/02/2014  . Carotid artery narrowing 07/09/2014  . Benign essential HTN 07/01/2014  . Osteoarthritis of thumb 09/24/2012  . Mixed hyperlipidemia 09/22/2012  . Arthritis of hand, degenerative 09/22/2012    Allergies  Allergen Reactions  . Augmentin [Amoxicillin-Pot Clavulanate] Nausea And Vomiting  . Codeine Nausea And Vomiting  . Sulfa Antibiotics Nausea And Vomiting  . Proparacaine Itching  . Shellfish Allergy Nausea And Vomiting    No issues with iodine     Past Surgical History:  Procedure Laterality Date  . ABDOMINAL HYSTERECTOMY     total  . APPENDECTOMY    . BREAST BIOPSY Right    neg  . BREAST CYST ASPIRATION Left    neg  . BUNIONECTOMY Bilateral   . CESAREAN SECTION    . COLONOSCOPY  2010   normal  . COLONOSCOPY WITH PROPOFOL N/A 04/03/2018   Procedure: COLONOSCOPY WITH BIOPSIES;  Surgeon: Lucilla Lame, MD;  Location: [facility];  Service: Endoscopy;  Laterality: N/A;  sleep apnea  . CORONARY ANGIOPLASTY WITH STENT PLACEMENT  04/2006  . dental implant  [age]  . epidural steroid injection  2018  . SHOULDER ARTHROSCOPY Right 05/07/2015   Procedure: Extensive arthroscopic debridement and arthroscopic subacromial decompression, right shoulder.  2. Steroid injection  right thumb CMC joint.;  Surgeon: Corky Mull, MD;  Location: [facility];  Service: Orthopedics;  Laterality: Right;  . TOOTH EXTRACTION     Upper centrals and lateral    Social History   Tobacco Use  . Smoking status: Former Smoker    Packs/day: 1.25    Years: 44.00    Pack years: 55.00    Types: Cigarettes    Last attempt to quit: 04/22/2006    Years since quitting: 12.0  . Smokeless tobacco: Never Used  . Tobacco comment:    Substance Use Topics  . Alcohol use: No    Alcohol/week: 0.0 standard drinks  . Drug use: No     Medication list has been reviewed and updated.  Current Meds  Medication Sig  . aspirin EC 81 MG tablet Take 81 mg by mouth daily.  . citalopram (CELEXA) 20 MG tablet Take 20 mg by mouth daily.   . cyclobenzaprine (FLEXERIL) 10 MG tablet TAKE 1 TABLET BY MOUTH 3  TIMES DAILY  . diazepam (VALIUM) 5 MG tablet Limit  1/2  tablet by mouth per day or every other day if tolerated  . Diclofenac-miSOPROStol 75-0.2 MG TBEC TAKE 1 TABLET BY MOUTH TWO  TIMES DAILY  . DULoxetine (CYMBALTA) 60 MG capsule Take 1 capsule (60 mg total) by mouth daily.  Marland Kitchen esomeprazole (NEXIUM) 20 MG capsule Take 40 mg by mouth daily. Over the Counter  . HYDROcodone-acetaminophen (NORCO/VICODIN) 5-325 MG tablet Take 1 tablet by mouth at bedtime. Limit 1-2 tablets by mouth per day if tolerated  . levothyroxine (SYNTHROID, LEVOTHROID) 88 MCG tablet TAKE 1 TABLET BY MOUTH  DAILY  . Loperamide HCl (IMODIUM PO) Take by mouth as needed.  Marland Kitchen losartan (COZAAR) 50 MG tablet Take 50 mg by mouth daily.  . Multiple Vitamin (MULTI-VITAMINS) TABS Take by mouth.  . NON FORMULARY   . pravastatin (PRAVACHOL) 80 MG tablet Take 80 mg by mouth daily.  Marland Kitchen tolterodine (DETROL LA) 4 MG 24 hr capsule TAKE 1 CAPSULE(4 MG) BY MOUTH DAILY  . traMADol (ULTRAM) 50 MG tablet Take 1 tablet (50 mg total) by mouth every 6 (six) hours as needed.    PHQ 2/9 Scores 04/25/2018 04/17/2018 03/20/2018 03/20/2018  PHQ - 2  Score 0 0 0 0  PHQ- 9 Score 0 - 0 -  Exception Documentation - - - -    Physical Exam Vitals signs and nursing note reviewed.  Constitutional:      General: She is not in acute distress.    Appearance: She is well-developed.  HENT:     Head: Normocephalic and atraumatic.     Right Ear: Tympanic membrane and ear canal normal.     Left Ear: Tympanic membrane and ear canal normal.     Nose: No congestion or rhinorrhea.     Mouth/Throat:     Mouth: Mucous membranes are moist.  Eyes:     Pupils: Pupils are equal, round, and reactive to light.  Neck:     Musculoskeletal: Normal range of motion and neck supple.  Cardiovascular:     Rate and Rhythm: Normal rate and regular rhythm.  Pulmonary:     Effort: Pulmonary effort is normal. No respiratory distress.     Breath sounds: Normal breath sounds.  Abdominal:  General: Abdomen is flat. There is no distension.     Palpations: There is no mass.     Tenderness: There is no abdominal tenderness. There is no guarding.  Musculoskeletal: Normal range of motion.  Skin:    General: Skin is warm and dry.     Findings: No rash.  Neurological:     Mental Status: She is alert and oriented to person, place, and time.  Psychiatric:        Behavior: Behavior normal.        Thought Content: Thought content normal.     BP 132/64   Pulse 93   Ht 5\' 8"  (1.727 m)   Wt 181 lb (82.1 kg)   SpO2 95%   BMI 27.52 kg/m   Assessment and Plan: 1. Change in bowel habits Adjust benefiber dose to achieve 1-2 soft stools per day Liberalize diet  2. Diverticulosis of colon Recently normal colonoscopy  3. Sinus congestion Continue Coricidin and Claritin as needed No s/s of infection at this time.   Partially dictated using Editor, commissioning. Any errors are unintentional.  Halina Maidens, MD [facility]  04/25/2018

## 2018-04-25 NOTE — Patient Instructions (Signed)
Start with 1 tsp of Benefiber daily - take with plenty of fluids.  Wait one week in between adjusting the dose.

## 2018-05-01 ENCOUNTER — Inpatient Hospital Stay: Admission: RE | Admit: 2018-05-01 | Payer: Medicare Other | Source: Ambulatory Visit

## 2018-05-08 ENCOUNTER — Encounter: Payer: Self-pay | Admitting: Anesthesiology

## 2018-05-08 ENCOUNTER — Ambulatory Visit: Payer: Medicare Other | Attending: Anesthesiology | Admitting: Anesthesiology

## 2018-05-08 ENCOUNTER — Other Ambulatory Visit: Payer: Self-pay

## 2018-05-08 VITALS — BP 127/70 | HR 94 | Temp 98.2°F | Resp 18 | Ht 68.0 in | Wt 179.0 lb

## 2018-05-08 DIAGNOSIS — M47812 Spondylosis without myelopathy or radiculopathy, cervical region: Secondary | ICD-10-CM | POA: Diagnosis present

## 2018-05-08 DIAGNOSIS — M5136 Other intervertebral disc degeneration, lumbar region: Secondary | ICD-10-CM | POA: Insufficient documentation

## 2018-05-08 DIAGNOSIS — M47816 Spondylosis without myelopathy or radiculopathy, lumbar region: Secondary | ICD-10-CM | POA: Insufficient documentation

## 2018-05-08 DIAGNOSIS — G8929 Other chronic pain: Secondary | ICD-10-CM | POA: Diagnosis not present

## 2018-05-08 DIAGNOSIS — F1190 Opioid use, unspecified, uncomplicated: Secondary | ICD-10-CM | POA: Insufficient documentation

## 2018-05-08 DIAGNOSIS — G894 Chronic pain syndrome: Secondary | ICD-10-CM | POA: Insufficient documentation

## 2018-05-08 DIAGNOSIS — M797 Fibromyalgia: Secondary | ICD-10-CM | POA: Diagnosis present

## 2018-05-08 DIAGNOSIS — M159 Polyosteoarthritis, unspecified: Secondary | ICD-10-CM | POA: Insufficient documentation

## 2018-05-08 DIAGNOSIS — M5441 Lumbago with sciatica, right side: Secondary | ICD-10-CM | POA: Insufficient documentation

## 2018-05-08 DIAGNOSIS — M5442 Lumbago with sciatica, left side: Secondary | ICD-10-CM

## 2018-05-08 MED ORDER — HYDROCODONE-ACETAMINOPHEN 5-325 MG PO TABS: 1.0000 | ORAL_TABLET | Freq: Every day | ORAL | 0 refills | Status: DC

## 2018-05-08 MED ORDER — DIAZEPAM 5 MG PO TABS: ORAL_TABLET | ORAL | 2 refills | Status: DC

## 2018-05-08 MED ORDER — TRAMADOL HCL 50 MG PO TABS: 50.0000 mg | ORAL_TABLET | Freq: Four times a day (QID) | ORAL | 3 refills | Status: AC | PRN

## 2018-05-08 NOTE — Progress Notes (Signed)
Nursing Pain Medication Assessment:  Safety precautions to be maintained throughout the outpatient stay will include: orient to surroundings, keep bed in low position, maintain call bell within reach at all times, provide assistance with transfer out of bed and ambulation.  Medication Inspection Compliance: Pill count conducted under aseptic conditions, in front of the patient. Neither the pills nor the bottle was removed from the patient's sight at any time. Once count was completed pills were immediately returned to the patient in their original bottle.  Medication #1: Hydrocodone/APAP Pill/Patch Count: 12 of 30 pills remain Pill/Patch Appearance: Markings consistent with prescribed medication Bottle Appearance: Standard pharmacy container. Clearly labeled. Filled Date: 02/08 / 2020 Last Medication intake:  Yesterday  Medication #2: Tramadol (Ultram) Pill/Patch Count: 18 of 90 pills remain Pill/Patch Appearance: Markings consistent with prescribed medication Bottle Appearance: Standard pharmacy container. Clearly labeled. Filled Date: 02/08 / 2020 Last Medication intake:  Today

## 2018-05-10 ENCOUNTER — Ambulatory Visit
Admission: RE | Admit: 2018-05-10 | Discharge: 2018-05-10 | Disposition: A | Discharge status: Home / Self Care | Payer: Medicare Other | Source: Ambulatory Visit | Attending: Internal Medicine | Admitting: Internal Medicine

## 2018-05-10 DIAGNOSIS — Z1231 Encounter for screening mammogram for malignant neoplasm of breast: Secondary | ICD-10-CM | POA: Insufficient documentation

## 2018-05-10 DIAGNOSIS — E2839 Other primary ovarian failure: Secondary | ICD-10-CM

## 2018-05-10 DIAGNOSIS — M85852 Other specified disorders of bone density and structure, left thigh: Secondary | ICD-10-CM | POA: Diagnosis not present

## 2018-05-10 DIAGNOSIS — Z780 Asymptomatic menopausal state: Secondary | ICD-10-CM | POA: Diagnosis not present

## 2018-05-10 NOTE — Progress Notes (Signed)
Subjective:  Patient ID: Carrie Myers, female    DOB: 07-Dec-1945  Age: 73 y.o. MRN: [id number]  CC: Back Pain (lower)   Procedure: None  HPI DARBY FLEEMAN presents for reevaluation.  She was last seen in December.  She still having troubles with diffuse lower back pain and lower extremity pain.  The quality characteristic distribution of this been stable in nature with no significant changes reported lately.  She still taking her medications as prescribed and these are working well for her based on her narcotic assessment sheet.  No other changes are reported.  Her bowel bladder function and lower extremity strength and function of been stable in nature.  Otherwise she has been doing well.  Outpatient Medications Prior to Visit  Medication Sig Dispense Refill  . aspirin EC 81 MG tablet Take 81 mg by mouth daily.    . citalopram (CELEXA) 20 MG tablet Take 20 mg by mouth daily.     . cyclobenzaprine (FLEXERIL) 10 MG tablet TAKE 1 TABLET BY MOUTH 3  TIMES DAILY 270 tablet 0  . Diclofenac-miSOPROStol 75-0.2 MG TBEC TAKE 1 TABLET BY MOUTH TWO  TIMES DAILY 180 tablet 1  . DULoxetine (CYMBALTA) 60 MG capsule Take 1 capsule (60 mg total) by mouth daily. 30 capsule 5  . esomeprazole (NEXIUM) 20 MG capsule Take 40 mg by mouth daily. Over the Counter    . levothyroxine (SYNTHROID, LEVOTHROID) 88 MCG tablet TAKE 1 TABLET BY MOUTH  DAILY 90 tablet 3  . Loperamide HCl (IMODIUM PO) Take by mouth as needed.    Marland Kitchen losartan (COZAAR) 50 MG tablet Take 50 mg by mouth daily.    . Multiple Vitamin (MULTI-VITAMINS) TABS Take by mouth.    . NON FORMULARY     . pravastatin (PRAVACHOL) 80 MG tablet Take 80 mg by mouth daily.    . diazepam (VALIUM) 5 MG tablet Limit  1/2  tablet by mouth per day or every other day if tolerated 30 tablet 1  . HYDROcodone-acetaminophen (NORCO/VICODIN) 5-325 MG tablet Take 1 tablet by mouth at bedtime. Limit 1-2 tablets by mouth per day if tolerated 30 tablet 0  . traMADol  (ULTRAM) 50 MG tablet Take 1 tablet (50 mg total) by mouth every 6 (six) hours as needed. 90 tablet 3  . tolterodine (DETROL LA) 4 MG 24 hr capsule TAKE 1 CAPSULE(4 MG) BY MOUTH DAILY (Patient not taking: Reported on 05/08/2018) 30 capsule 0   No facility-administered medications prior to visit.     Review of Systems CNS: No confusion or sedation Cardiac: No angina or palpitations GI: No abdominal pain or constipation Constitutional: No nausea vomiting fevers or chills  Objective:  BP 127/70   Pulse 94   Temp 98.2 F (36.8 C) (Oral)   Resp 18   Ht 5\' 8"  (1.727 m)   Wt 179 lb (81.2 kg)   SpO2 100%   BMI 27.22 kg/m    BP Readings from Last 3 Encounters:  05/08/18 127/70  04/25/18 132/64  04/17/18 (!) 148/98     Wt Readings from Last 3 Encounters:  05/08/18 179 lb (81.2 kg)  04/25/18 181 lb (82.1 kg)  04/17/18 177 lb 12.8 oz (80.6 kg)     Physical Exam Pt is alert and oriented PERRL EOMI HEART IS RRR no murmur or rub LCTA no wheezing or rales MUSCULOSKELETAL reveals some paraspinous muscle tenderness in the lower back.  No overt trigger points are noted.  Her muscle tone and  bulk to the lower extremities remains at baseline.  He is ambulating with a mildly antalgic gait.  Labs  No results found for: HGBA1C Lab Results  Component Value Date   LDLCALC 105 (H) 03/20/2018   CREATININE 0.90 03/20/2018    -------------------------------------------------------------------------------------------------------------------- Lab Results  Component Value Date   WBC 6.7 03/20/2018   HGB 11.8 03/20/2018   HCT 36.0 03/20/2018   PLT 341 03/20/2018   GLUCOSE 97 03/20/2018   CHOL 188 03/20/2018   TRIG 191 (H) 03/20/2018   HDL 45 03/20/2018   LDLCALC 105 (H) 03/20/2018   ALT 28 03/20/2018   AST 30 03/20/2018   NA 143 03/20/2018   K 5.0 03/20/2018   CL 102 03/20/2018   CREATININE 0.90 03/20/2018   BUN 14 03/20/2018   CO2 25 03/20/2018   TSH 3.950 03/20/2018     --------------------------------------------------------------------------------------------------------------------- No results found.   Assessment & Plan:   Adonna was seen today for back pain.  Diagnoses and all orders for this visit:  Chronic bilateral low back pain with bilateral sciatica  DDD (degenerative disc disease), lumbar  Chronic pain syndrome  Facet syndrome, lumbar  Fibromyalgia  Cervical facet joint syndrome  Generalized osteoarthritis  Chronic, continuous use of opioids  Other orders -     HYDROcodone-acetaminophen (NORCO/VICODIN) 5-325 MG tablet; Take 1 tablet by mouth at bedtime for 30 days. Limit 1-2 tablets by mouth per day if tolerated -     HYDROcodone-acetaminophen (NORCO/VICODIN) 5-325 MG tablet; Take 1 tablet by mouth at bedtime for 30 days. -     Discontinue: HYDROcodone-acetaminophen (NORCO/VICODIN) 5-325 MG tablet; Take 1 tablet by mouth at bedtime for 30 days. -     HYDROcodone-acetaminophen (NORCO/VICODIN) 5-325 MG tablet; Take 1 tablet by mouth at bedtime for 30 days. -     traMADol (ULTRAM) 50 MG tablet; Take 1 tablet (50 mg total) by mouth every 6 (six) hours as needed for up to 30 days. -     diazepam (VALIUM) 5 MG tablet; Limit  1/2  tablet by mouth per day or every other day if tolerated        ----------------------------------------------------------------------------------------------------------------------  Problem List Items Addressed This Visit      Unprioritized   Chronic pain syndrome (Chronic)   Facet syndrome, lumbar   Relevant Medications   HYDROcodone-acetaminophen (NORCO/VICODIN) 5-325 MG tablet (Start on 05/13/2018)   HYDROcodone-acetaminophen (NORCO/VICODIN) 5-325 MG tablet (Start on 06/12/2018)   HYDROcodone-acetaminophen (NORCO/VICODIN) 5-325 MG tablet (Start on 07/13/2018)   traMADol (ULTRAM) 50 MG tablet   Fibromyalgia   Generalized osteoarthritis   Relevant Medications   HYDROcodone-acetaminophen  (NORCO/VICODIN) 5-325 MG tablet (Start on 05/13/2018)   HYDROcodone-acetaminophen (NORCO/VICODIN) 5-325 MG tablet (Start on 06/12/2018)   HYDROcodone-acetaminophen (NORCO/VICODIN) 5-325 MG tablet (Start on 07/13/2018)   traMADol (ULTRAM) 50 MG tablet    Other Visit Diagnoses    Chronic bilateral low back pain with bilateral sciatica    -  Primary   Relevant Medications   HYDROcodone-acetaminophen (NORCO/VICODIN) 5-325 MG tablet (Start on 05/13/2018)   HYDROcodone-acetaminophen (NORCO/VICODIN) 5-325 MG tablet (Start on 06/12/2018)   HYDROcodone-acetaminophen (NORCO/VICODIN) 5-325 MG tablet (Start on 07/13/2018)   traMADol (ULTRAM) 50 MG tablet   diazepam (VALIUM) 5 MG tablet (Start on 05/20/2018)   DDD (degenerative disc disease), lumbar       Relevant Medications   HYDROcodone-acetaminophen (NORCO/VICODIN) 5-325 MG tablet (Start on 05/13/2018)   HYDROcodone-acetaminophen (NORCO/VICODIN) 5-325 MG tablet (Start on 06/12/2018)   HYDROcodone-acetaminophen (NORCO/VICODIN) 5-325 MG tablet (Start on  07/13/2018)   traMADol (ULTRAM) 50 MG tablet   Cervical facet joint syndrome       Relevant Medications   HYDROcodone-acetaminophen (NORCO/VICODIN) 5-325 MG tablet (Start on 05/13/2018)   HYDROcodone-acetaminophen (NORCO/VICODIN) 5-325 MG tablet (Start on 06/12/2018)   HYDROcodone-acetaminophen (NORCO/VICODIN) 5-325 MG tablet (Start on 07/13/2018)   traMADol (ULTRAM) 50 MG tablet   Chronic, continuous use of opioids            ----------------------------------------------------------------------------------------------------------------------  1. Chronic bilateral low back pain with bilateral sciatica We will continue with her basic medication management.  Refills will be given today for the next few months.  We will schedule her for return to clinic in 3 months.  We have reviewed the [facility] practitioner database information and it is appropriate.  The meantime she is to continue with core  stretching strengthening exercises as previously reviewed.  2. DDD (degenerative disc disease), lumbar Above  3. Chronic pain syndrome As above  4. Facet syndrome, lumbar   5. Fibromyalgia   6. Cervical facet joint syndrome   7. Generalized osteoarthritis   8. Chronic, continuous use of opioids     ----------------------------------------------------------------------------------------------------------------------  I have changed Gray[place] Jack. [place]'s HYDROcodone-acetaminophen and traMADol. I am also having her start on HYDROcodone-acetaminophen. Additionally, I am having her maintain her esomeprazole, losartan, pravastatin, aspirin EC, MULTI-VITAMINS, NON FORMULARY, levothyroxine, cyclobenzaprine, tolterodine, DULoxetine, Diclofenac-miSOPROStol, Loperamide HCl (IMODIUM PO), citalopram, HYDROcodone-acetaminophen, and diazepam.   Meds ordered this encounter  Medications  . HYDROcodone-acetaminophen (NORCO/VICODIN) 5-325 MG tablet    Sig: Take 1 tablet by mouth at bedtime for 30 days. Limit 1-2 tablets by mouth per day if tolerated    Dispense:  30 tablet    Refill:  0    30 day supply  . HYDROcodone-acetaminophen (NORCO/VICODIN) 5-325 MG tablet    Sig: Take 1 tablet by mouth at bedtime for 30 days.    Dispense:  30 tablet    Refill:  0    30 day supply  . DISCONTD: HYDROcodone-acetaminophen (NORCO/VICODIN) 5-325 MG tablet    Sig: Take 1 tablet by mouth at bedtime for 30 days.    Dispense:  30 tablet    Refill:  0    30 day supply  . HYDROcodone-acetaminophen (NORCO/VICODIN) 5-325 MG tablet    Sig: Take 1 tablet by mouth at bedtime for 30 days.    Dispense:  30 tablet    Refill:  0    30 day supply  . traMADol (ULTRAM) 50 MG tablet    Sig: Take 1 tablet (50 mg total) by mouth every 6 (six) hours as needed for up to 30 days.    Dispense:  90 tablet    Refill:  3  . diazepam (VALIUM) 5 MG tablet    Sig: Limit  1/2  tablet by mouth per day or every other day if  tolerated    Dispense:  30 tablet    Refill:  2    Needs to last one month   Patient's Medications  New Prescriptions   HYDROCODONE-ACETAMINOPHEN (NORCO/VICODIN) 5-325 MG TABLET    Take 1 tablet by mouth at bedtime for 30 days.   HYDROCODONE-ACETAMINOPHEN (NORCO/VICODIN) 5-325 MG TABLET    Take 1 tablet by mouth at bedtime for 30 days.  Previous Medications   ASPIRIN EC 81 MG TABLET    Take 81 mg by mouth daily.   CITALOPRAM (CELEXA) 20 MG TABLET    Take 20 mg by mouth daily.    CYCLOBENZAPRINE (FLEXERIL)  10 MG TABLET    TAKE 1 TABLET BY MOUTH 3  TIMES DAILY   DICLOFENAC-MISOPROSTOL 75-0.2 MG TBEC    TAKE 1 TABLET BY MOUTH TWO  TIMES DAILY   DULOXETINE (CYMBALTA) 60 MG CAPSULE    Take 1 capsule (60 mg total) by mouth daily.   ESOMEPRAZOLE (NEXIUM) 20 MG CAPSULE    Take 40 mg by mouth daily. Over the Counter   LEVOTHYROXINE (SYNTHROID, LEVOTHROID) 88 MCG TABLET    TAKE 1 TABLET BY MOUTH  DAILY   LOPERAMIDE HCL (IMODIUM PO)    Take by mouth as needed.   LOSARTAN (COZAAR) 50 MG TABLET    Take 50 mg by mouth daily.   MULTIPLE VITAMIN (MULTI-VITAMINS) TABS    Take by mouth.   NON FORMULARY       PRAVASTATIN (PRAVACHOL) 80 MG TABLET    Take 80 mg by mouth daily.   TOLTERODINE (DETROL LA) 4 MG 24 HR CAPSULE    TAKE 1 CAPSULE(4 MG) BY MOUTH DAILY  Modified Medications   Modified Medication Previous Medication   DIAZEPAM (VALIUM) 5 MG TABLET diazepam (VALIUM) 5 MG tablet      Limit  1/2  tablet by mouth per day or every other day if tolerated    Limit  1/2  tablet by mouth per day or every other day if tolerated   HYDROCODONE-ACETAMINOPHEN (NORCO/VICODIN) 5-325 MG TABLET HYDROcodone-acetaminophen (NORCO/VICODIN) 5-325 MG tablet      Take 1 tablet by mouth at bedtime for 30 days. Limit 1-2 tablets by mouth per day if tolerated    Take 1 tablet by mouth at bedtime. Limit 1-2 tablets by mouth per day if tolerated   TRAMADOL (ULTRAM) 50 MG TABLET traMADol (ULTRAM) 50 MG tablet      Take 1 tablet (50  mg total) by mouth every 6 (six) hours as needed for up to 30 days.    Take 1 tablet (50 mg total) by mouth every 6 (six) hours as needed.  Discontinued Medications   No medications on file   ----------------------------------------------------------------------------------------------------------------------  Follow-up: Return in about 3 months (around 08/06/2018) for evaluation, med refill.    Molli Barrows, MD

## 2018-05-10 NOTE — Progress Notes (Signed)
LM to calll

## 2018-05-12 ENCOUNTER — Ambulatory Visit (INDEPENDENT_AMBULATORY_CARE_PROVIDER_SITE_OTHER): Payer: Medicare Other | Admitting: Obstetrics & Gynecology

## 2018-05-12 ENCOUNTER — Encounter: Payer: Self-pay | Admitting: Obstetrics & Gynecology

## 2018-05-12 VITALS — BP 120/80 | Ht 67.5 in | Wt 181.0 lb

## 2018-05-12 DIAGNOSIS — R3915 Urgency of urination: Secondary | ICD-10-CM | POA: Diagnosis not present

## 2018-05-12 DIAGNOSIS — N8111 Cystocele, midline: Secondary | ICD-10-CM | POA: Diagnosis not present

## 2018-05-12 DIAGNOSIS — R350 Frequency of micturition: Secondary | ICD-10-CM | POA: Diagnosis not present

## 2018-05-12 MED ORDER — TOLTERODINE TARTRATE ER 4 MG PO CP24: ORAL_CAPSULE | ORAL | 12 refills | Status: DC

## 2018-05-12 NOTE — Progress Notes (Signed)
History of Present Illness:  Carrie Myers is a 73 y.o. who was started on Detrol LA 4 mg (after Myrbetriq was found to be too expensive)  approximately 1 year ago. Since that time, she states that her symptoms are improving.  She tried to come off for 2 mos but has had return of urinary sx's of freq and urge and also has recurrent diarrhea, which she feels may be related to coming off of Detrol.  Min SE of dry mouth.  PMHx: She  has a past medical history of Allergy, Anxiety, Arthritis, Bilateral dry eyes, Chronic low back pain (12/23/2016), DDD (degenerative disc disease), lumbar (10/02/2014), Dental crowns present, Fibromyalgia, Fibromyalgia, Fracture of right foot (01/14/2015), GERD (gastroesophageal reflux disease), Hyperlipidemia, Hypertension, Kidney stones, Myocardial infarction ([place]) (04/22/2006), Osteoarthritis, Other shoulder lesions, right shoulder (04/17/2015), Rheumatoid arthritis ([place]), Sleep apnea, Status post shoulder surgery (05/27/2015), Thyroid disease, and Vertigo. Also,  has a past surgical history that includes Appendectomy; Cesarean section; Abdominal hysterectomy; Bunionectomy (Bilateral); dental implant; Colonoscopy (2010); Coronary angioplasty with stent (04/2006); Shoulder arthroscopy (Right, 05/07/2015); epidural steroid injection (2018); Tooth extraction; Colonoscopy with propofol (N/A, 04/03/2018); Breast cyst aspiration (Left); and Breast biopsy (Right)., family history includes Diabetes in her sister; Heart disease in her father; Pancreatic cancer in her sister; Prostate cancer in her brother; Stroke in her mother.,  reports that she quit smoking about 12 years ago. Her smoking use included cigarettes. She has a 55.00 pack-year smoking history. She has never used smokeless tobacco. She reports that she does not drink alcohol or use drugs. Current Meds  Medication Sig  . aspirin EC 81 MG tablet Take 81 mg by mouth daily.  . citalopram (CELEXA) 20 MG tablet Take 20 mg by mouth  daily.   . cyclobenzaprine (FLEXERIL) 10 MG tablet TAKE 1 TABLET BY MOUTH 3  TIMES DAILY  . [START ON 05/20/2018] diazepam (VALIUM) 5 MG tablet Limit  1/2  tablet by mouth per day or every other day if tolerated  . Diclofenac-miSOPROStol 75-0.2 MG TBEC TAKE 1 TABLET BY MOUTH TWO  TIMES DAILY  . DULoxetine (CYMBALTA) 60 MG capsule Take 1 capsule (60 mg total) by mouth daily.  Marland Kitchen esomeprazole (NEXIUM) 20 MG capsule Take 40 mg by mouth daily. Over the Counter  . [START ON 05/13/2018] HYDROcodone-acetaminophen (NORCO/VICODIN) 5-325 MG tablet Take 1 tablet by mouth at bedtime for 30 days. Limit 1-2 tablets by mouth per day if tolerated  . [START ON 06/12/2018] HYDROcodone-acetaminophen (NORCO/VICODIN) 5-325 MG tablet Take 1 tablet by mouth at bedtime for 30 days.  Derrill Memo ON 07/13/2018] HYDROcodone-acetaminophen (NORCO/VICODIN) 5-325 MG tablet Take 1 tablet by mouth at bedtime for 30 days.  Marland Kitchen levothyroxine (SYNTHROID, LEVOTHROID) 88 MCG tablet TAKE 1 TABLET BY MOUTH  DAILY  . Loperamide HCl (IMODIUM PO) Take by mouth as needed.  Marland Kitchen losartan (COZAAR) 50 MG tablet Take 50 mg by mouth daily.  . Multiple Vitamin (MULTI-VITAMINS) TABS Take by mouth.  . NON FORMULARY   . pravastatin (PRAVACHOL) 80 MG tablet Take 80 mg by mouth daily.  Marland Kitchen tolterodine (DETROL LA) 4 MG 24 hr capsule TAKE 1 CAPSULE(4 MG) BY MOUTH DAILY  . traMADol (ULTRAM) 50 MG tablet Take 1 tablet (50 mg total) by mouth every 6 (six) hours as needed for up to 30 days.  . [DISCONTINUED] tolterodine (DETROL LA) 4 MG 24 hr capsule TAKE 1 CAPSULE(4 MG) BY MOUTH DAILY  . Also, is allergic to augmentin [amoxicillin-pot clavulanate]; codeine; sulfa antibiotics; proparacaine; and shellfish  allergy..  Review of Systems  All other systems reviewed and are negative.  Physical Exam:  BP 120/80   Ht 5' 7.5" (1.715 m)   Wt 181 lb (82.1 kg)   BMI 27.93 kg/m  Body mass index is 27.93 kg/m. Constitutional: Well nourished, well developed female in no acute  distress.  Abdomen: diffusely non tender to palpation, non distended, and no masses, hernias Neuro: Grossly intact Psych:  Normal mood and affect.    Assessment:  Problem List Items Addressed This Visit      Genitourinary   Cystocele, midline - Primary     Other   Urinary urgency   Urinary frequency     Medication treatment is going well for her urinary freq and urge.  Plan: She will undergo resume of Detrol LA 4 mg daily in her medical therapy.  She was amenable to this plan and we will see her back for annual/PRN.  A total of 15 minutes were spent face-to-face with the patient during this encounter and over half of that time dealt with counseling and coordination of care.  Barnett Applebaum, MD, [facility], [facility] 05/12/2018  2:24 PM

## 2018-05-12 NOTE — Patient Instructions (Signed)
Tolterodine extended-release capsules What is this medicine? TOLTERODINE (tole TER a deen) is used to treat overactive bladder. This medicine reduces the amount of bathroom visits. It may also help to control wetting accidents. This medicine may be used for other purposes; ask your health care provider or pharmacist if you have questions. COMMON BRAND NAME(S): Detrol LA What should I tell my health care provider before I take this medicine? They need to know if you have any of these conditions: -difficulty passing urine -glaucoma -intestinal obstruction -irregular heartbeat or you have a family member with irregular heartbeat -kidney disease -liver disease -myasthenia gravis -an unusual or allergic reaction to tolterodine, fesoterodine, other medicines, foods, dyes, or preservatives -pregnant or trying to get pregnant -breast-feeding How should I use this medicine? Take this medicine by mouth with a glass of water. Swallow whole, do not crush, cut, or chew. Follow the directions on the prescription label. Take your doses at regular intervals. Do not take your medicine more often than directed. Talk to your pediatrician regarding the use of this medicine in children. Special care may be needed. Overdosage: If you think you have taken too much of this medicine contact a poison control center or emergency room at once. NOTE: This medicine is only for you. Do not share this medicine with others. What if I miss a dose? If you miss a dose, take it as soon as you can. If it is almost time for your next dose, take only that dose. Do not take double or extra doses. What may interact with this medicine? -clarithromycin -cyclosporine -erythromycin -fluoxetine -medicines for fungal infections, like fluconazole, itraconazole, ketoconazole or voriconazole -medicines for memory problems like galantamine, donepezil, tacrine -vinblastine This list may not describe all possible interactions. Give your  health care provider a list of all the medicines, herbs, non-prescription drugs, or dietary supplements you use. Also tell them if you smoke, drink alcohol, or use illegal drugs. Some items may interact with your medicine. What should I watch for while using this medicine? It may take 2 or 3 months to notice the full benefit from this medicine. Your health care professional may also recommend techniques that may help improve control of your bladder and sphincter muscles. These techniques will help you need the bathroom less frequently. You may need to limit your intake tea, coffee, caffeinated sodas, and alcohol. These drinks may make your symptoms worse. Keeping healthy bowel habits may lessen bladder symptoms. If you currently smoke, quitting smoking may help reduce irritation to the bladder muscle. You may get drowsy or dizzy. Do not drive, use machinery, or do anything that needs mental alertness until you know how this drug affects you. Do not stand or sit up quickly, especially if you are an older patient. This reduces the risk of dizzy or fainting spells. Your mouth may get dry. Chewing sugarless gum or sucking hard candy, and drinking plenty of water, will help. This medicine may cause dry eyes and blurred vision. If you wear contact lenses you may feel some discomfort. Lubricating drops may help. See your eye doctor if the problem does not go away or is severe. What side effects may I notice from receiving this medicine? Side effects that you should report to your doctor or health care professional as soon as possible: -allergic reactions like skin rash, itching or hives, swelling of the face, lips, or tongue -breathing problems -confusion -difficulty passing urine -fast, irregular heartbeat -hallucinations -memory problems -swelling in feet, hands Side effects that  usually do not require medical attention (report to your doctor or health care professional if they continue or are  bothersome): -changes in vision -constipation -dry eyes, mouth -headache -dizziness, drowsiness -stomach upset This list may not describe all possible side effects. Call your doctor for medical advice about side effects. You may report side effects to FDA at 1-800-FDA-1088. Where should I keep my medicine? Keep out of the reach of children. Store at room temperature between 15 and 30 degrees C (59 and 86 degrees F). Protect from light. Throw away any unused medicine after the expiration date. NOTE: This sheet is a summary. It may not cover all possible information. If you have questions about this medicine, talk to your doctor, pharmacist, or health care provider.  2019 Elsevier/Gold Standard (2009-12-09 17:20:26)

## 2018-05-30 ENCOUNTER — Other Ambulatory Visit: Payer: Self-pay | Admitting: Internal Medicine

## 2018-06-15 ENCOUNTER — Other Ambulatory Visit: Payer: Self-pay | Admitting: Internal Medicine

## 2018-08-09 ENCOUNTER — Telehealth: Payer: Self-pay

## 2018-08-09 NOTE — Telephone Encounter (Signed)
Pt calling to let Farley know Duloxetine 60mg  which she had stopped taking; 72m later started having diarrhea that got worse and worse and worse; PH put her back on it; after about 2 1/56m it did straighten out bowels out again; her bowels are back to normal.  Thought PH would want to know this b/c when she called to tell us she was going to stop taking duloxetine, no one suggested she not do that.  Thank you.  [phone]

## 2018-08-21 ENCOUNTER — Ambulatory Visit: Payer: Medicare Other | Attending: Anesthesiology | Admitting: Anesthesiology

## 2018-08-21 ENCOUNTER — Other Ambulatory Visit: Payer: Self-pay

## 2018-08-21 ENCOUNTER — Encounter: Payer: Self-pay | Admitting: Anesthesiology

## 2018-08-21 DIAGNOSIS — M47816 Spondylosis without myelopathy or radiculopathy, lumbar region: Secondary | ICD-10-CM | POA: Diagnosis not present

## 2018-08-21 DIAGNOSIS — M533 Sacrococcygeal disorders, not elsewhere classified: Secondary | ICD-10-CM

## 2018-08-21 DIAGNOSIS — G894 Chronic pain syndrome: Secondary | ICD-10-CM | POA: Diagnosis not present

## 2018-08-21 DIAGNOSIS — M5441 Lumbago with sciatica, right side: Secondary | ICD-10-CM

## 2018-08-21 DIAGNOSIS — M797 Fibromyalgia: Secondary | ICD-10-CM

## 2018-08-21 DIAGNOSIS — M5136 Other intervertebral disc degeneration, lumbar region: Secondary | ICD-10-CM

## 2018-08-21 DIAGNOSIS — G8929 Other chronic pain: Secondary | ICD-10-CM

## 2018-08-21 DIAGNOSIS — M47812 Spondylosis without myelopathy or radiculopathy, cervical region: Secondary | ICD-10-CM

## 2018-08-21 DIAGNOSIS — F1190 Opioid use, unspecified, uncomplicated: Secondary | ICD-10-CM

## 2018-08-21 DIAGNOSIS — M159 Polyosteoarthritis, unspecified: Secondary | ICD-10-CM

## 2018-08-21 DIAGNOSIS — M5442 Lumbago with sciatica, left side: Secondary | ICD-10-CM | POA: Diagnosis not present

## 2018-08-22 ENCOUNTER — Telehealth: Payer: Self-pay | Admitting: Anesthesiology

## 2018-08-22 ENCOUNTER — Encounter: Payer: Self-pay | Admitting: Anesthesiology

## 2018-08-22 MED ORDER — DIAZEPAM 5 MG PO TABS: 5.0000 mg | ORAL_TABLET | Freq: Every day | ORAL | 2 refills | Status: DC

## 2018-08-22 MED ORDER — TRAMADOL HCL 50 MG PO TABS: 50.0000 mg | ORAL_TABLET | Freq: Four times a day (QID) | ORAL | 2 refills | Status: AC | PRN

## 2018-08-22 MED ORDER — HYDROCODONE-ACETAMINOPHEN 5-325 MG PO TABS: 1.0000 | ORAL_TABLET | Freq: Every day | ORAL | 0 refills | Status: DC

## 2018-08-22 NOTE — Telephone Encounter (Signed)
Pam from [facility] wanted to verify directions on the rx of valium. She states there are 2 sets of directions.

## 2018-08-22 NOTE — Progress Notes (Signed)
Virtual Visit via Telephone Note 2I connected with Carrie Myers on 08/22/18 at  3:00 PM EDT by telephone and verified that I am speaking with the correct person using two identifiers.  Location: Patient: Home Provider: Pain control center   I discussed the limitations, risks, security and privacy concerns of performing an evaluation and management service by telephone and the availability of in person appointments. I also discussed with the patient that there may be a patient responsible charge related to this service. The patient expressed understanding and agreed to proceed.   History of Present Illness: I spoke with Carrie Myers today via telephone conferencing.  She was unable to do the video patrol conferencing but seems to be doing well.  She is taking her medications as prescribed.  She is having some significant low back pain consistent with what she has had in the past and is trying to sparingly use the hydrocodone.  She does question whether she can increase her tramadol.  She takes Valium once a day.  This combination seems to work well for her.  She is still doing her physical therapy exercises as best tolerated considering the COVID crisis.  Otherwise no other changes noted in lower extremity strength or function.    Observations/Objective: Current Outpatient Medications:  .  aspirin EC 81 MG tablet, Take 81 mg by mouth daily., Disp: , Rfl:  .  citalopram (CELEXA) 20 MG tablet, TAKE 1 TABLET BY MOUTH  DAILY, Disp: 90 tablet, Rfl: 1 .  cyclobenzaprine (FLEXERIL) 10 MG tablet, TAKE 1 TABLET BY MOUTH 3  TIMES DAILY, Disp: 270 tablet, Rfl: 0 .  diazepam (VALIUM) 5 MG tablet, Limit  1/2  tablet by mouth per day or every other day if tolerated, Disp: 30 tablet, Rfl: 2 .  diazepam (VALIUM) 5 MG tablet, Take 1 tablet (5 mg total) by mouth at bedtime for 30 days. 1/2 table qhs for sleep and muscle spasm, Disp: 30 tablet, Rfl: 2 .  Diclofenac-miSOPROStol 75-0.2 MG TBEC, TAKE 1 TABLET BY MOUTH  TWO  TIMES DAILY, Disp: 180 tablet, Rfl: 1 .  DULoxetine (CYMBALTA) 60 MG capsule, Take 1 capsule (60 mg total) by mouth daily., Disp: 30 capsule, Rfl: 5 .  esomeprazole (NEXIUM) 20 MG capsule, Take 40 mg by mouth daily. Over the Counter, Disp: , Rfl:  .  HYDROcodone-acetaminophen (NORCO/VICODIN) 5-325 MG tablet, Take 1 tablet by mouth at bedtime for 30 days. Limit 1-2 tablets by mouth per day if tolerated, Disp: 30 tablet, Rfl: 0 .  HYDROcodone-acetaminophen (NORCO/VICODIN) 5-325 MG tablet, Take 1 tablet by mouth at bedtime for 30 days., Disp: 30 tablet, Rfl: 0 .  HYDROcodone-acetaminophen (NORCO/VICODIN) 5-325 MG tablet, Take 1 tablet by mouth at bedtime for 30 days., Disp: 30 tablet, Rfl: 0 .  HYDROcodone-acetaminophen (NORCO/VICODIN) 5-325 MG tablet, Take 1 tablet by mouth daily for 30 days., Disp: 30 tablet, Rfl: 0 .  levothyroxine (SYNTHROID, LEVOTHROID) 88 MCG tablet, TAKE 1 TABLET BY MOUTH  DAILY, Disp: 90 tablet, Rfl: 3 .  Loperamide HCl (IMODIUM PO), Take by mouth as needed., Disp: , Rfl:  .  losartan (COZAAR) 50 MG tablet, Take 50 mg by mouth daily., Disp: , Rfl:  .  Multiple Vitamin (MULTI-VITAMINS) TABS, Take by mouth., Disp: , Rfl:  .  NON FORMULARY, , Disp: , Rfl:  .  pravastatin (PRAVACHOL) 80 MG tablet, Take 80 mg by mouth daily., Disp: , Rfl:  .  tolterodine (DETROL LA) 4 MG 24 hr capsule, TAKE 1 CAPSULE(4 MG)  BY MOUTH DAILY, Disp: 30 capsule, Rfl: 12 .  traMADol (ULTRAM) 50 MG tablet, Take 1 tablet (50 mg total) by mouth every 6 (six) hours as needed for up to 30 days., Disp: 120 tablet, Rfl: 2   Assessment and Plan: 1. Chronic bilateral low back pain with bilateral sciatica   2. DDD (degenerative disc disease), lumbar   3. Chronic pain syndrome   4. Facet syndrome, lumbar   5. Fibromyalgia   6. Cervical facet joint syndrome   7. Generalized osteoarthritis   8. Chronic, continuous use of opioids   9. Sacroiliac joint disease   Based on our discussion today and upon  review of the [facility] practitioner database information I am going to refill her Vicodin and hydrocodone for the next month with refills on the tramadol and increase to 4 times a day dosing as needed if needed.  This will be with 2 refills.  I have instructed her to follow-up with [place] in 2 months of the pain control center and contact us should she have any problems in the meantime.  She is also to continue follow-up with her primary care physicians.   Follow Up Instructions:    I discussed the assessment and treatment plan with the patient. The patient was provided an opportunity to ask questions and all were answered. The patient agreed with the plan and demonstrated an understanding of the instructions.   The patient was advised to call back or seek an in-person evaluation if the symptoms worsen or if the condition fails to improve as anticipated.  I provided 20 minutes of non-face-to-face time during this encounter.   Molli Barrows, MD

## 2018-08-22 NOTE — Telephone Encounter (Signed)
Spoke with Dr Andree Elk for clarification.  Valium 1 tablet at bedtime. Pharmacy notified.

## 2018-09-06 DIAGNOSIS — I6523 Occlusion and stenosis of bilateral carotid arteries: Secondary | ICD-10-CM | POA: Diagnosis not present

## 2018-09-06 DIAGNOSIS — I2510 Atherosclerotic heart disease of native coronary artery without angina pectoris: Secondary | ICD-10-CM | POA: Diagnosis not present

## 2018-09-06 DIAGNOSIS — E782 Mixed hyperlipidemia: Secondary | ICD-10-CM | POA: Diagnosis not present

## 2018-09-06 DIAGNOSIS — I10 Essential (primary) hypertension: Secondary | ICD-10-CM | POA: Diagnosis not present

## 2018-09-18 ENCOUNTER — Encounter: Payer: Self-pay | Admitting: Internal Medicine

## 2018-09-18 ENCOUNTER — Ambulatory Visit (INDEPENDENT_AMBULATORY_CARE_PROVIDER_SITE_OTHER): Payer: Medicare Other | Admitting: Internal Medicine

## 2018-09-18 ENCOUNTER — Other Ambulatory Visit: Payer: Self-pay

## 2018-09-18 VITALS — BP 122/68 | HR 91 | Temp 97.1°F | Ht 67.5 in | Wt 186.0 lb

## 2018-09-18 DIAGNOSIS — F331 Major depressive disorder, recurrent, moderate: Secondary | ICD-10-CM

## 2018-09-18 DIAGNOSIS — E782 Mixed hyperlipidemia: Secondary | ICD-10-CM | POA: Diagnosis not present

## 2018-09-18 DIAGNOSIS — R21 Rash and other nonspecific skin eruption: Secondary | ICD-10-CM

## 2018-09-18 DIAGNOSIS — I10 Essential (primary) hypertension: Secondary | ICD-10-CM | POA: Diagnosis not present

## 2018-09-18 NOTE — Progress Notes (Signed)
Date:  09/18/2018   Name:  Carrie Myers   DOB:  [birth year]   MRN:  [id number]   Chief Complaint: Hypertension (Follow up) and Depression (Follow up. PHQ9- 3)  Hypertension This is a chronic problem. The problem is controlled. Pertinent negatives include no chest pain, headaches, palpitations or shortness of breath. Past treatments include angiotensin blockers. The current treatment provides significant improvement.  Hyperlipidemia This is a chronic problem. The problem is controlled. Pertinent negatives include no chest pain or shortness of breath. Current antihyperlipidemic treatment includes statins. The current treatment provides significant improvement of lipids. There are no compliance problems.   Depression        This is a chronic problem.  The problem has been gradually improving since onset.  Associated symptoms include no fatigue and no headaches.     The symptoms are aggravated by family issues and social issues.  Past treatments include SSRIs - Selective serotonin reuptake inhibitors.  Compliance with treatment is good.  Previous treatment provided significant relief.   Review of Systems  Constitutional: Negative for chills, fatigue and fever.  Respiratory: Negative for cough, chest tightness, shortness of breath and wheezing.   Cardiovascular: Negative for chest pain, palpitations and leg swelling.  Skin: Positive for rash.  Neurological: Negative for dizziness, light-headedness and headaches.  Psychiatric/Behavioral: Positive for depression. Negative for dysphoric mood and sleep disturbance. The patient is not nervous/anxious.     Patient Active Problem List   Diagnosis Date Noted  . Urinary urgency 05/12/2018  . Urinary frequency 05/12/2018  . Diverticulosis of colon 04/25/2018  . Change in bowel habits   . Abnormal feces   . Tobacco use disorder, moderate, in sustained remission 12/22/2017  . Moderate episode of recurrent major depressive disorder ([place])  10/03/2017  . Inflammatory spondylopathy of lumbar region ([place]) 10/03/2017  . Hoarseness, persistent 04/18/2017  . OSA on CPAP 02/18/2017  . Cervical disc disease 12/31/2016  . Generalized osteoarthritis 12/23/2016  . Chronic pain syndrome 12/23/2016  . Cystocele, midline 12/06/2016  . Urinary incontinence 12/06/2016  . Impingement syndrome of right shoulder 04/17/2015  . Fibrocystic breast 01/18/2015  . Gastroesophageal reflux disease without esophagitis 01/18/2015  . Hypothyroidism, postablative 01/18/2015  . Arteriosclerosis of coronary artery 12/29/2014  . DJD of shoulder 11/13/2014  . Fibromyalgia 10/02/2014  . Intercostal neuralgia 10/02/2014  . Facet syndrome, lumbar 10/02/2014  . Sacroiliac joint disease 10/02/2014  . Carotid artery narrowing 07/09/2014  . Benign essential HTN 07/01/2014  . Osteoarthritis of thumb 09/24/2012  . Mixed hyperlipidemia 09/22/2012  . Arthritis of hand, degenerative 09/22/2012    Allergies  Allergen Reactions  . Augmentin [Amoxicillin-Pot Clavulanate] Nausea And Vomiting  . Codeine Nausea And Vomiting  . Sulfa Antibiotics Nausea And Vomiting  . Proparacaine Itching  . Shellfish Allergy Nausea And Vomiting    No issues with iodine     Past Surgical History:  Procedure Laterality Date  . ABDOMINAL HYSTERECTOMY     total  . APPENDECTOMY    . BREAST BIOPSY Right    neg  . BREAST CYST ASPIRATION Left    neg  . BUNIONECTOMY Bilateral   . CESAREAN SECTION    . COLONOSCOPY  2010   normal  . COLONOSCOPY WITH PROPOFOL N/A 04/03/2018   Procedure: COLONOSCOPY WITH BIOPSIES;  Surgeon: Lucilla Lame, MD;  Location: [facility];  Service: Endoscopy;  Laterality: N/A;  sleep apnea  . CORONARY ANGIOPLASTY WITH STENT PLACEMENT  04/2006  . dental implant  seven  . epidural steroid injection  2018  . SHOULDER ARTHROSCOPY Right 05/07/2015   Procedure: Extensive arthroscopic debridement and arthroscopic subacromial decompression, right  shoulder.  2. Steroid injection right thumb CMC joint.;  Surgeon: Corky Mull, MD;  Location: [facility];  Service: Orthopedics;  Laterality: Right;  . TOOTH EXTRACTION     Upper centrals and lateral    Social History   Tobacco Use  . Smoking status: Former Smoker    Packs/day: 1.25    Years: 44.00    Pack years: 55.00    Types: Cigarettes    Quit date: 04/22/2006    Years since quitting: 12.4  . Smokeless tobacco: Never Used  . Tobacco comment:    Substance Use Topics  . Alcohol use: No    Alcohol/week: 0.0 standard drinks  . Drug use: No     Medication list has been reviewed and updated.  Current Meds  Medication Sig  . aspirin EC 81 MG tablet Take 81 mg by mouth daily.  . citalopram (CELEXA) 20 MG tablet TAKE 1 TABLET BY MOUTH  DAILY  . cyclobenzaprine (FLEXERIL) 10 MG tablet TAKE 1 TABLET BY MOUTH 3  TIMES DAILY  . diazepam (VALIUM) 5 MG tablet Limit  1/2  tablet by mouth per day or every other day if tolerated  . Diclofenac-miSOPROStol 75-0.2 MG TBEC TAKE 1 TABLET BY MOUTH TWO  TIMES DAILY  . DULoxetine (CYMBALTA) 60 MG capsule Take 1 capsule (60 mg total) by mouth daily.  Marland Kitchen HYDROcodone-acetaminophen (NORCO/VICODIN) 5-325 MG tablet Take 1 tablet by mouth at bedtime for 30 days. Limit 1-2 tablets by mouth per day if tolerated  . levothyroxine (SYNTHROID, LEVOTHROID) 88 MCG tablet TAKE 1 TABLET BY MOUTH  DAILY  . Loperamide HCl (IMODIUM PO) Take by mouth as needed.  Marland Kitchen losartan (COZAAR) 50 MG tablet Take 50 mg by mouth daily.  . Multiple Vitamin (MULTI-VITAMINS) TABS Take by mouth.  . NON FORMULARY   . pravastatin (PRAVACHOL) 80 MG tablet Take 80 mg by mouth daily.  Marland Kitchen tolterodine (DETROL LA) 4 MG 24 hr capsule TAKE 1 CAPSULE(4 MG) BY MOUTH DAILY  . traMADol (ULTRAM) 50 MG tablet Take 1 tablet (50 mg total) by mouth every 6 (six) hours as needed for up to 30 days.    PHQ 2/9 Scores 09/18/2018 05/08/2018 04/25/2018 04/17/2018  PHQ - 2 Score 0 0 0  0  PHQ- 9 Score 3 - 0 -  Exception Documentation - - - -    BP Readings from Last 3 Encounters:  09/18/18 122/68  05/12/18 120/80  05/08/18 127/70    Physical Exam Vitals signs and nursing note reviewed.  Constitutional:      General: She is not in acute distress.    Appearance: She is well-developed.  HENT:     Head: Normocephalic and atraumatic.  Neck:     Musculoskeletal: Normal range of motion and neck supple.  Cardiovascular:     Rate and Rhythm: Normal rate and regular rhythm.     Pulses: Normal pulses.     Heart sounds: No murmur.  Pulmonary:     Effort: Pulmonary effort is normal. No respiratory distress.  Musculoskeletal: Normal range of motion.     Right lower leg: No edema.     Left lower leg: No edema.  Skin:    General: Skin is warm and dry.     Findings: Rash present.          Comments: Eczematous rash  excoriations  Neurological:     Mental Status: She is alert and oriented to person, place, and time.  Psychiatric:        Attention and Perception: Attention normal.        Mood and Affect: Mood normal.        Behavior: Behavior normal.        Thought Content: Thought content normal.        Cognition and Memory: Cognition normal.     Wt Readings from Last 3 Encounters:  09/18/18 186 lb (84.4 kg)  05/12/18 181 lb (82.1 kg)  05/08/18 179 lb (81.2 kg)    BP 122/68   Pulse 91   Temp (!) 97.1 F (36.2 C) (Oral)   Ht 5' 7.5" (1.715 m)   Wt 186 lb (84.4 kg)   SpO2 95%   BMI 28.70 kg/m   Assessment and Plan: 1. Benign essential HTN controlled  2. Moderate episode of recurrent major depressive disorder (HCC) Doing well - continue Cymbalta and Celexa  3. Rash Recommend Dermatology evaluation Pt declines offer of topical steroid  4. Mixed hyperlipidemia Continue Pravachol as same dose Recheck in 6 mo.   Partially dictated using Editor, commissioning. Any errors are unintentional.  Halina Maidens, MD [facility]  09/18/2018

## 2018-09-18 NOTE — Patient Instructions (Signed)
Dr. Tobie Po[facility] in [place]

## 2018-10-11 ENCOUNTER — Other Ambulatory Visit: Payer: Self-pay | Admitting: Internal Medicine

## 2018-11-22 ENCOUNTER — Other Ambulatory Visit: Payer: Self-pay | Admitting: Internal Medicine

## 2018-11-22 DIAGNOSIS — M797 Fibromyalgia: Secondary | ICD-10-CM

## 2018-12-12 ENCOUNTER — Ambulatory Visit: Payer: Medicare Other | Attending: Anesthesiology | Admitting: Anesthesiology

## 2018-12-12 ENCOUNTER — Encounter: Payer: Self-pay | Admitting: Anesthesiology

## 2018-12-12 ENCOUNTER — Other Ambulatory Visit: Payer: Self-pay

## 2018-12-12 DIAGNOSIS — G894 Chronic pain syndrome: Secondary | ICD-10-CM | POA: Diagnosis not present

## 2018-12-12 DIAGNOSIS — F1190 Opioid use, unspecified, uncomplicated: Secondary | ICD-10-CM

## 2018-12-12 DIAGNOSIS — M5442 Lumbago with sciatica, left side: Secondary | ICD-10-CM

## 2018-12-12 DIAGNOSIS — M19012 Primary osteoarthritis, left shoulder: Secondary | ICD-10-CM

## 2018-12-12 DIAGNOSIS — M5441 Lumbago with sciatica, right side: Secondary | ICD-10-CM

## 2018-12-12 DIAGNOSIS — G8929 Other chronic pain: Secondary | ICD-10-CM

## 2018-12-12 DIAGNOSIS — M5136 Other intervertebral disc degeneration, lumbar region: Secondary | ICD-10-CM | POA: Diagnosis not present

## 2018-12-12 DIAGNOSIS — M47812 Spondylosis without myelopathy or radiculopathy, cervical region: Secondary | ICD-10-CM

## 2018-12-12 DIAGNOSIS — M797 Fibromyalgia: Secondary | ICD-10-CM

## 2018-12-12 DIAGNOSIS — M19011 Primary osteoarthritis, right shoulder: Secondary | ICD-10-CM

## 2018-12-12 DIAGNOSIS — M159 Polyosteoarthritis, unspecified: Secondary | ICD-10-CM

## 2018-12-12 DIAGNOSIS — M545 Low back pain: Secondary | ICD-10-CM

## 2018-12-12 MED ORDER — DIAZEPAM 5 MG PO TABS: ORAL_TABLET | ORAL | 2 refills | Status: DC

## 2018-12-12 MED ORDER — HYDROCODONE-ACETAMINOPHEN 5-325 MG PO TABS: 1.0000 | ORAL_TABLET | Freq: Every day | ORAL | 0 refills | Status: DC

## 2018-12-12 NOTE — Progress Notes (Signed)
Virtual Visit via Telephone Note  I connected with Carrie Myers on 12/12/18 at 10:00 AM EDT by telephone and verified that I am speaking with the correct person using two identifiers.  Location: Patient: Home Provider: Pain control center   I discussed the limitations, risks, security and privacy concerns of performing an evaluation and management service by telephone and the availability of in person appointments. I also discussed with the patient that there may be a patient responsible charge related to this service. The patient expressed understanding and agreed to proceed.   History of Present Illness: I spoke with Carrie Myers via telephone conferencing this morning.  That she is having a lot more trouble with her left lower extremity pain.  She has had epidurals in the past with good results and her last one was reportedly 1 year ago.  No change in lower extremity strength or bowel or bladder function is noted but the pain has been more problematic and she desires to come in for an epidural rather than increasing her medication usage.  She still taking Vicodin once a day in addition to 2 tramadol spread out throughout the day.  She takes a half of Valium at night.  She has had some problems with some mild dizziness in the morning and she is questioning whether her duloxetine might be attributing to this.  Otherwise she is doing well though she is having some neck pain with popping and crackling in the morning she reports central posterior neck worse when she gets out of bed and better in about an hour and a half.  This is been a chronic problem for her but otherwise she is in her usual state of health.  She is tolerating her medications without difficulty otherwise and reports good relief with her tramadol and Vicodin.    Observations/Objective: Current Outpatient Medications:  .  aspirin EC 81 MG tablet, Take 81 mg by mouth daily., Disp: , Rfl:  .  citalopram (CELEXA) 20 MG tablet,  TAKE 1 TABLET BY MOUTH  DAILY, Disp: 90 tablet, Rfl: 3 .  cyclobenzaprine (FLEXERIL) 10 MG tablet, TAKE 1 TABLET BY MOUTH 3  TIMES DAILY, Disp: 270 tablet, Rfl: 0 .  [START ON 12/20/2018] diazepam (VALIUM) 5 MG tablet, Limit  1/2  tablet by mouth per day or every other day if tolerated, Disp: 30 tablet, Rfl: 2 .  Diclofenac-miSOPROStol 75-0.2 MG TBEC, TAKE 1 TABLET BY MOUTH TWO  TIMES DAILY, Disp: 180 tablet, Rfl: 3 .  DULoxetine (CYMBALTA) 60 MG capsule, Take 1 capsule (60 mg total) by mouth daily., Disp: 30 capsule, Rfl: 5 .  esomeprazole (NEXIUM) 20 MG capsule, Take 40 mg by mouth daily. Over the Counter, Disp: , Rfl:  .  HYDROcodone-acetaminophen (NORCO/VICODIN) 5-325 MG tablet, Take 1 tablet by mouth daily for 30 days. (Patient not taking: Reported on 09/18/2018), Disp: 30 tablet, Rfl: 0 .  HYDROcodone-acetaminophen (NORCO/VICODIN) 5-325 MG tablet, Take 1 tablet by mouth at bedtime. Limit 1-2 tablets by mouth per day if tolerated, Disp: 30 tablet, Rfl: 0 .  levothyroxine (SYNTHROID, LEVOTHROID) 88 MCG tablet, TAKE 1 TABLET BY MOUTH  DAILY, Disp: 90 tablet, Rfl: 3 .  Loperamide HCl (IMODIUM PO), Take by mouth as needed., Disp: , Rfl:  .  losartan (COZAAR) 50 MG tablet, Take 50 mg by mouth daily., Disp: , Rfl:  .  Multiple Vitamin (MULTI-VITAMINS) TABS, Take by mouth., Disp: , Rfl:  .  NON FORMULARY, , Disp: , Rfl:  .  pravastatin (  PRAVACHOL) 80 MG tablet, Take 80 mg by mouth daily., Disp: , Rfl:  .  tolterodine (DETROL LA) 4 MG 24 hr capsule, TAKE 1 CAPSULE(4 MG) BY MOUTH DAILY, Disp: 30 capsule, Rfl: 12   Assessment and Plan: 1. Chronic bilateral low back pain with bilateral sciatica   2. DDD (degenerative disc disease), lumbar   3. Chronic pain syndrome   4. Fibromyalgia   5. Chronic, continuous use of opioids   6. Cervical facet joint syndrome   7. Generalized osteoarthritis   8. Chronic bilateral low back pain without sciatica   9. Primary osteoarthritis of both shoulders   10.  Chronic left-sided low back pain without sciatica   Based on our discussion today I am going to refill her Vicodin.  I have reviewed the [facility] practitioner database information and it is appropriate.  We will going to schedule her for a 2-week return for an epidural steroid injection.  She has had good relief with these in the past.  I am also refilling her Valium.  I did talk to her about delaying the use of her duloxetine in the morning to see if the dizziness might be related to that dosing.  I have also gone over some stretching exercises for her cervicalgia and cervical facet syndrome.  She has been through physical therapy for this and is denying any change in upper extremity strength.  We have also talked about use of an orthotic pillow.  She is scheduled for 2-week return to clinic.   Follow Up Instructions:    I discussed the assessment and treatment plan with the patient. The patient was provided an opportunity to ask questions and all were answered. The patient agreed with the plan and demonstrated an understanding of the instructions.   The patient was advised to call back or seek an in-person evaluation if the symptoms worsen or if the condition fails to improve as anticipated.  I provided 30 minutes of non-face-to-face time during this encounter.   Molli Barrows, MD

## 2019-01-01 ENCOUNTER — Encounter: Payer: Self-pay | Admitting: Anesthesiology

## 2019-01-01 ENCOUNTER — Ambulatory Visit (HOSPITAL_BASED_OUTPATIENT_CLINIC_OR_DEPARTMENT_OTHER): Payer: Medicare Other | Admitting: Anesthesiology

## 2019-01-01 ENCOUNTER — Other Ambulatory Visit: Payer: Self-pay | Admitting: Anesthesiology

## 2019-01-01 ENCOUNTER — Other Ambulatory Visit: Payer: Self-pay

## 2019-01-01 ENCOUNTER — Ambulatory Visit
Admission: RE | Admit: 2019-01-01 | Discharge: 2019-01-01 | Disposition: A | Discharge status: Home / Self Care | Payer: Medicare Other | Source: Ambulatory Visit | Attending: Anesthesiology | Admitting: Anesthesiology

## 2019-01-01 VITALS — BP 145/88 | HR 85 | Temp 98.1°F | Resp 18 | Ht 68.0 in | Wt 188.0 lb

## 2019-01-01 DIAGNOSIS — M5442 Lumbago with sciatica, left side: Secondary | ICD-10-CM | POA: Insufficient documentation

## 2019-01-01 DIAGNOSIS — M5441 Lumbago with sciatica, right side: Secondary | ICD-10-CM | POA: Insufficient documentation

## 2019-01-01 DIAGNOSIS — M5136 Other intervertebral disc degeneration, lumbar region: Secondary | ICD-10-CM | POA: Diagnosis present

## 2019-01-01 DIAGNOSIS — M47812 Spondylosis without myelopathy or radiculopathy, cervical region: Secondary | ICD-10-CM | POA: Insufficient documentation

## 2019-01-01 DIAGNOSIS — G8929 Other chronic pain: Secondary | ICD-10-CM

## 2019-01-01 DIAGNOSIS — G894 Chronic pain syndrome: Secondary | ICD-10-CM | POA: Diagnosis present

## 2019-01-01 DIAGNOSIS — M5432 Sciatica, left side: Secondary | ICD-10-CM | POA: Diagnosis present

## 2019-01-01 DIAGNOSIS — M797 Fibromyalgia: Secondary | ICD-10-CM | POA: Diagnosis present

## 2019-01-01 DIAGNOSIS — R52 Pain, unspecified: Secondary | ICD-10-CM | POA: Diagnosis present

## 2019-01-01 DIAGNOSIS — F1190 Opioid use, unspecified, uncomplicated: Secondary | ICD-10-CM

## 2019-01-01 HISTORY — DX: Sciatica, left side: M54.32

## 2019-01-01 MED ORDER — TRIAMCINOLONE ACETONIDE 40 MG/ML IJ SUSP
INTRAMUSCULAR | Status: AC
  Filled 2019-01-01: qty 1

## 2019-01-01 MED ORDER — MIDAZOLAM HCL 5 MG/5ML IJ SOLN
INTRAMUSCULAR | Status: AC
  Filled 2019-01-01: qty 5

## 2019-01-01 MED ORDER — LIDOCAINE HCL (PF) 1 % IJ SOLN
INTRAMUSCULAR | Status: AC
  Filled 2019-01-01: qty 10

## 2019-01-01 MED ORDER — TRIAMCINOLONE ACETONIDE 40 MG/ML IJ SUSP
40.0000 mg | Freq: Once | INTRAMUSCULAR | Status: AC
  Administered 2019-01-01: 15:00:00 40 mg

## 2019-01-01 MED ORDER — LIDOCAINE HCL (PF) 1 % IJ SOLN
5.0000 mL | Freq: Once | INTRAMUSCULAR | Status: AC
  Administered 2019-01-01: 5 mL via SUBCUTANEOUS

## 2019-01-01 MED ORDER — ROPIVACAINE HCL 2 MG/ML IJ SOLN
10.0000 mL | Freq: Once | INTRAMUSCULAR | Status: AC
  Administered 2019-01-01: 15:00:00 1 mL via EPIDURAL

## 2019-01-01 MED ORDER — ROPIVACAINE HCL 2 MG/ML IJ SOLN
INTRAMUSCULAR | Status: AC
  Filled 2019-01-01: qty 10

## 2019-01-01 MED ORDER — SODIUM CHLORIDE (PF) 0.9 % IJ SOLN
INTRAMUSCULAR | Status: AC
  Filled 2019-01-01: qty 10

## 2019-01-01 MED ORDER — LACTATED RINGERS IV SOLN
1000.0000 mL | INTRAVENOUS | Status: DC
  Administered 2019-01-01: 1000 mL via INTRAVENOUS

## 2019-01-01 MED ORDER — HYDROCODONE-ACETAMINOPHEN 5-325 MG PO TABS: 1.0000 | ORAL_TABLET | Freq: Every day | ORAL | 0 refills | Status: DC

## 2019-01-01 MED ORDER — IOPAMIDOL (ISOVUE-M 200) INJECTION 41%
20.0000 mL | Freq: Once | INTRAMUSCULAR | Status: DC | PRN
  Administered 2019-01-01: 20 mL
  Filled 2019-01-01: qty 20

## 2019-01-01 MED ORDER — SODIUM CHLORIDE 0.9% FLUSH
10.0000 mL | Freq: Once | INTRAVENOUS | Status: AC
  Administered 2019-01-01: 15:00:00 10 mL

## 2019-01-01 MED ORDER — MIDAZOLAM HCL 2 MG/2ML IJ SOLN
5.0000 mg | Freq: Once | INTRAMUSCULAR | Status: AC
  Administered 2019-01-01: 3 mg via INTRAVENOUS
  Filled 2019-01-01: qty 5

## 2019-01-01 NOTE — Progress Notes (Signed)
Subjective:  Patient ID: Carrie Myers, female    DOB: 09-Mar-1946  Age: 73 y.o. MRN: [id number]  CC: Back Pain (low, left)   Procedure: L5-S1 epidural steroid under fluoroscopic guidance with moderate sedation  HPI Carrie Myers presents for reevaluation.  She was last seen a few months ago and has had a considerable increase in the severity of her left lower back pain and left posterior lateral leg pain consistent with what she has had in the past.  Previously she has had epidural steroid injections for this type of pain and they have worked well giving her approximately 75 to 80% relief in her calf and low back pain for about 2 months before she gets a gradual recurrence of the same pain.  It has been over a year since her last epidural and she reports good response otherwise.  She still takes her Vicodin once a day and takes Valium approximately once a day as well.  She spaces out some tramadol with that as well and this combination is working well for her and allowing her to stay limited on her opioid exposure.  She is doing her stretching and strengthening exercises with minimal relief and desires to proceed with an epidural injection today.  Otherwise no change in strength or bowel or bladder function is noted.  Outpatient Medications Prior to Visit  Medication Sig Dispense Refill  . aspirin EC 81 MG tablet Take 81 mg by mouth daily.    . citalopram (CELEXA) 20 MG tablet TAKE 1 TABLET BY MOUTH  DAILY 90 tablet 3  . cyclobenzaprine (FLEXERIL) 10 MG tablet TAKE 1 TABLET BY MOUTH 3  TIMES DAILY 270 tablet 0  . diazepam (VALIUM) 5 MG tablet Limit  1/2  tablet by mouth per day or every other day if tolerated 30 tablet 2  . Diclofenac-miSOPROStol 75-0.2 MG TBEC TAKE 1 TABLET BY MOUTH TWO  TIMES DAILY 180 tablet 3  . DULoxetine (CYMBALTA) 60 MG capsule Take 1 capsule (60 mg total) by mouth daily. 30 capsule 5  . esomeprazole (NEXIUM) 20 MG capsule Take 40 mg by mouth daily. Over the  Counter    . HYDROcodone-acetaminophen (NORCO/VICODIN) 5-325 MG tablet Take 1 tablet by mouth daily for 30 days. 30 tablet 0  . Ibuprofen-diphenhydrAMINE Cit (MOTRIN PM PO) Advil PM  Take 2 tabs po qhs    . levothyroxine (SYNTHROID, LEVOTHROID) 88 MCG tablet TAKE 1 TABLET BY MOUTH  DAILY 90 tablet 3  . Loperamide HCl (IMODIUM PO) Take by mouth as needed.    Marland Kitchen losartan (COZAAR) 50 MG tablet Take 50 mg by mouth daily.    . Multiple Vitamin (MULTI-VITAMINS) TABS Take by mouth.    . NON FORMULARY     . pravastatin (PRAVACHOL) 80 MG tablet Take 80 mg by mouth daily.    Marland Kitchen tolterodine (DETROL LA) 4 MG 24 hr capsule TAKE 1 CAPSULE(4 MG) BY MOUTH DAILY 30 capsule 12  . HYDROcodone-acetaminophen (NORCO/VICODIN) 5-325 MG tablet Take 1 tablet by mouth at bedtime. Limit 1-2 tablets by mouth per day if tolerated 30 tablet 0  . traMADol (ULTRAM) 50 MG tablet TK 1 T PO Q 6 H PRN     No facility-administered medications prior to visit.     Review of Systems CNS: No confusion or sedation Cardiac: No angina or palpitations GI: No abdominal pain or constipation Constitutional: No nausea vomiting fevers or chills  Objective:  BP (!) 148/88   Pulse 85   Temp  97.7 F (36.5 C)   Resp 16   Ht 5\' 8"  (1.727 m)   Wt 188 lb (85.3 kg)   SpO2 97%   BMI 28.59 kg/m    BP Readings from Last 3 Encounters:  01/01/19 (!) 148/88  09/18/18 122/68  05/12/18 120/80     Wt Readings from Last 3 Encounters:  01/01/19 188 lb (85.3 kg)  09/18/18 186 lb (84.4 kg)  05/12/18 181 lb (82.1 kg)     Physical Exam Pt is alert and oriented PERRL EOMI HEART IS RRR no murmur or rub LCTA no wheezing or rales MUSCULOSKELETAL reveals some paraspinous muscle tenderness and a positive straight leg raise on the left side.  Her muscle tone and bulk is at baseline.  She has a mildly antalgic gait  Labs  No results found for: HGBA1C Lab Results  Component Value Date   LDLCALC 105 (H) 03/20/2018   CREATININE 0.90  03/20/2018    -------------------------------------------------------------------------------------------------------------------- Lab Results  Component Value Date   WBC 6.7 03/20/2018   HGB 11.8 03/20/2018   HCT 36.0 03/20/2018   PLT 341 03/20/2018   GLUCOSE 97 03/20/2018   CHOL 188 03/20/2018   TRIG 191 (H) 03/20/2018   HDL 45 03/20/2018   LDLCALC 105 (H) 03/20/2018   ALT 28 03/20/2018   AST 30 03/20/2018   NA 143 03/20/2018   K 5.0 03/20/2018   CL 102 03/20/2018   CREATININE 0.90 03/20/2018   BUN 14 03/20/2018   CO2 25 03/20/2018   TSH 3.950 03/20/2018    --------------------------------------------------------------------------------------------------------------------- No results found.   Assessment & Plan:   Britley was seen today for back pain.  Diagnoses and all orders for this visit:  Chronic bilateral low back pain with bilateral sciatica  DDD (degenerative disc disease), lumbar -     Lumbar Epidural Injection; Future  Chronic pain syndrome  Fibromyalgia  Chronic, continuous use of opioids  Cervical facet joint syndrome  Sciatica, left side -     Lumbar Epidural Injection; Future  Other orders -     HYDROcodone-acetaminophen (NORCO/VICODIN) 5-325 MG tablet; Take 1 tablet by mouth at bedtime. Limit 1-2 tablets by mouth per day if tolerated        ----------------------------------------------------------------------------------------------------------------------  Problem List Items Addressed This Visit      Unprioritized   Chronic pain syndrome (Chronic)   Relevant Medications   traMADol (ULTRAM) 50 MG tablet   HYDROcodone-acetaminophen (NORCO/VICODIN) 5-325 MG tablet (Start on 01/11/2019)   Fibromyalgia   Relevant Medications   traMADol (ULTRAM) 50 MG tablet   HYDROcodone-acetaminophen (NORCO/VICODIN) 5-325 MG tablet (Start on 01/11/2019)   Sciatica, left side   Relevant Medications   Ibuprofen-diphenhydrAMINE Cit (MOTRIN PM PO)    Other Relevant Orders   Lumbar Epidural Injection    Other Visit Diagnoses    Chronic bilateral low back pain with bilateral sciatica    -  Primary   Relevant Medications   traMADol (ULTRAM) 50 MG tablet   Ibuprofen-diphenhydrAMINE Cit (MOTRIN PM PO)   HYDROcodone-acetaminophen (NORCO/VICODIN) 5-325 MG tablet (Start on 01/11/2019)   DDD (degenerative disc disease), lumbar       Relevant Medications   traMADol (ULTRAM) 50 MG tablet   HYDROcodone-acetaminophen (NORCO/VICODIN) 5-325 MG tablet (Start on 01/11/2019)   Other Relevant Orders   Lumbar Epidural Injection   Chronic, continuous use of opioids       Cervical facet joint syndrome            ----------------------------------------------------------------------------------------------------------------------  1. Chronic bilateral  low back pain with bilateral sciatica .  We will proceed with a repeat epidural today.  The risks and benefits of been reviewed and all questions answered.  We will have her return to clinic in 1 month for repeat epidural as well.  I want her to continue with her stretching strengthening exercises as tolerated.  2. DDD (degenerative disc disease), lumbar As above - Lumbar Epidural Injection; Future  3. Chronic pain syndrome I reviewed her medications and we will refill her Vicodin for October 29.  We have reviewed the [facility] practitioner database information and it is appropriate.  4. Fibromyalgia As above and continue core stretching strengthening  5. Chronic, continuous use of opioids As above  6. Cervical facet joint syndrome   7. Sciatica, left side As above - Lumbar Epidural Injection; Future    ----------------------------------------------------------------------------------------------------------------------  I am having Carrie Myers maintain her esomeprazole, losartan, pravastatin, aspirin EC, Multi-Vitamins, NON FORMULARY, cyclobenzaprine, DULoxetine, Loperamide  HCl (IMODIUM PO), tolterodine, levothyroxine, HYDROcodone-acetaminophen, citalopram, Diclofenac-miSOPROStol, diazepam, traMADol, Ibuprofen-diphenhydrAMINE Cit (MOTRIN PM PO), and HYDROcodone-acetaminophen.   Meds ordered this encounter  Medications  . HYDROcodone-acetaminophen (NORCO/VICODIN) 5-325 MG tablet    Sig: Take 1 tablet by mouth at bedtime. Limit 1-2 tablets by mouth per day if tolerated    Dispense:  30 tablet    Refill:  0    30 day supply   Patient's Medications  New Prescriptions   No medications on file  Previous Medications   ASPIRIN EC 81 MG TABLET    Take 81 mg by mouth daily.   CITALOPRAM (CELEXA) 20 MG TABLET    TAKE 1 TABLET BY MOUTH  DAILY   CYCLOBENZAPRINE (FLEXERIL) 10 MG TABLET    TAKE 1 TABLET BY MOUTH 3  TIMES DAILY   DIAZEPAM (VALIUM) 5 MG TABLET    Limit  1/2  tablet by mouth per day or every other day if tolerated   DICLOFENAC-MISOPROSTOL 75-0.2 MG TBEC    TAKE 1 TABLET BY MOUTH TWO  TIMES DAILY   DULOXETINE (CYMBALTA) 60 MG CAPSULE    Take 1 capsule (60 mg total) by mouth daily.   ESOMEPRAZOLE (NEXIUM) 20 MG CAPSULE    Take 40 mg by mouth daily. Over the Counter   HYDROCODONE-ACETAMINOPHEN (NORCO/VICODIN) 5-325 MG TABLET    Take 1 tablet by mouth daily for 30 days.   IBUPROFEN-DIPHENHYDRAMINE CIT (MOTRIN PM PO)    Advil PM  Take 2 tabs po qhs   LEVOTHYROXINE (SYNTHROID, LEVOTHROID) 88 MCG TABLET    TAKE 1 TABLET BY MOUTH  DAILY   LOPERAMIDE HCL (IMODIUM PO)    Take by mouth as needed.   LOSARTAN (COZAAR) 50 MG TABLET    Take 50 mg by mouth daily.   MULTIPLE VITAMIN (MULTI-VITAMINS) TABS    Take by mouth.   NON FORMULARY       PRAVASTATIN (PRAVACHOL) 80 MG TABLET    Take 80 mg by mouth daily.   TOLTERODINE (DETROL LA) 4 MG 24 HR CAPSULE    TAKE 1 CAPSULE(4 MG) BY MOUTH DAILY   TRAMADOL (ULTRAM) 50 MG TABLET    TK 1 T PO Q 6 H PRN  Modified Medications   Modified Medication Previous Medication   HYDROCODONE-ACETAMINOPHEN (NORCO/VICODIN) 5-325 MG TABLET  HYDROcodone-acetaminophen (NORCO/VICODIN) 5-325 MG tablet      Take 1 tablet by mouth at bedtime. Limit 1-2 tablets by mouth per day if tolerated    Take 1 tablet by mouth at bedtime. Limit 1-2  tablets by mouth per day if tolerated  Discontinued Medications   No medications on file   ----------------------------------------------------------------------------------------------------------------------  Follow-up: Return in about 1 month (around 02/01/2019) for evaluation, procedure.   Procedure: L5-S1 LESI with fluoroscopic guidance and with moderate sedation  NOTE: The risks, benefits, and expectations of the procedure have been discussed and explained to the patient who was understanding and in agreement with suggested treatment plan. No guarantees were made.  DESCRIPTION OF PROCEDURE: Lumbar epidural steroid injection with 3 mg IV Versed, EKG, blood pressure, pulse, and pulse oximetry monitoring. The procedure was performed with the patient in the prone position under fluoroscopic guidance.  Sterile prep x3 was initiated and I then injected subcutaneous lidocaine to the overlying L5-S1 site after its fluoroscopic identifictation.  Using strict aseptic technique, I then advanced an 18-gauge Tuohy epidural needle in the midline using interlaminar approach via loss-of-resistance to saline technique. There was negative aspiration for heme or  CSF.  I then confirmed position with both AP and Lateral fluoroscan.  2 cc of contrast dye were injected and a  total of 5 mL of Preservative-Free normal saline mixed with 40 mg of Kenalog and 1cc Ropicaine 0.2 percent were injected incrementally via the  epidurally placed needle. The needle was removed. The patient tolerated the injection well and was convalesced and discharged to home in stable condition. Should the patient have any post procedure difficulty they have been instructed on how to contact us for assistance.    Molli Barrows, MD

## 2019-01-01 NOTE — Progress Notes (Signed)
Safety precautions to be maintained throughout the outpatient stay will include: orient to surroundings, keep bed in low position, maintain call bell within reach at all times, provide assistance with transfer out of bed and ambulation.  

## 2019-01-19 ENCOUNTER — Telehealth: Payer: Self-pay | Admitting: *Deleted

## 2019-01-19 NOTE — Telephone Encounter (Signed)
Left message for patient to notify them that it is time to schedule annual low dose lung cancer screening CT scan. Instructed patient to call back to verify information prior to the scan being scheduled.  

## 2019-02-06 ENCOUNTER — Other Ambulatory Visit: Payer: Self-pay | Admitting: Anesthesiology

## 2019-02-07 ENCOUNTER — Telehealth: Payer: Self-pay

## 2019-02-07 NOTE — Telephone Encounter (Signed)
Attempted to call pt to inform her that it is time for her annual lung cancer screening. Unable to leave voicemail due to there not being a voicemail option.

## 2019-02-20 ENCOUNTER — Ambulatory Visit
Admission: RE | Admit: 2019-02-20 | Discharge: 2019-02-20 | Disposition: A | Discharge status: Home / Self Care | Payer: Medicare Other | Source: Ambulatory Visit | Attending: Anesthesiology | Admitting: Anesthesiology

## 2019-02-20 ENCOUNTER — Other Ambulatory Visit: Payer: Self-pay | Admitting: Anesthesiology

## 2019-02-20 ENCOUNTER — Other Ambulatory Visit: Payer: Self-pay

## 2019-02-20 ENCOUNTER — Encounter: Payer: Self-pay | Admitting: Anesthesiology

## 2019-02-20 ENCOUNTER — Ambulatory Visit (HOSPITAL_BASED_OUTPATIENT_CLINIC_OR_DEPARTMENT_OTHER): Payer: Medicare Other | Admitting: Anesthesiology

## 2019-02-20 VITALS — BP 145/88 | HR 82 | Temp 98.0°F | Resp 17 | Ht 68.0 in | Wt 188.0 lb

## 2019-02-20 DIAGNOSIS — F1190 Opioid use, unspecified, uncomplicated: Secondary | ICD-10-CM | POA: Insufficient documentation

## 2019-02-20 DIAGNOSIS — M19011 Primary osteoarthritis, right shoulder: Secondary | ICD-10-CM

## 2019-02-20 DIAGNOSIS — G8929 Other chronic pain: Secondary | ICD-10-CM | POA: Diagnosis not present

## 2019-02-20 DIAGNOSIS — M5442 Lumbago with sciatica, left side: Secondary | ICD-10-CM | POA: Diagnosis not present

## 2019-02-20 DIAGNOSIS — G894 Chronic pain syndrome: Secondary | ICD-10-CM

## 2019-02-20 DIAGNOSIS — M797 Fibromyalgia: Secondary | ICD-10-CM | POA: Insufficient documentation

## 2019-02-20 DIAGNOSIS — M5136 Other intervertebral disc degeneration, lumbar region: Secondary | ICD-10-CM | POA: Diagnosis present

## 2019-02-20 DIAGNOSIS — M19012 Primary osteoarthritis, left shoulder: Secondary | ICD-10-CM | POA: Insufficient documentation

## 2019-02-20 DIAGNOSIS — M5432 Sciatica, left side: Secondary | ICD-10-CM | POA: Insufficient documentation

## 2019-02-20 DIAGNOSIS — R52 Pain, unspecified: Secondary | ICD-10-CM | POA: Diagnosis present

## 2019-02-20 DIAGNOSIS — M5441 Lumbago with sciatica, right side: Secondary | ICD-10-CM | POA: Insufficient documentation

## 2019-02-20 MED ORDER — MIDAZOLAM HCL 5 MG/5ML IJ SOLN
INTRAMUSCULAR | Status: AC
  Filled 2019-02-20: qty 5

## 2019-02-20 MED ORDER — DIAZEPAM 5 MG PO TABS: 5.0000 mg | ORAL_TABLET | Freq: Every day | ORAL | 2 refills | Status: AC

## 2019-02-20 MED ORDER — IOHEXOL 180 MG/ML  SOLN
2.0000 mL | Freq: Once | INTRAMUSCULAR | Status: AC | PRN
  Administered 2019-02-20: 2 mL via INTRATHECAL

## 2019-02-20 MED ORDER — LIDOCAINE HCL (PF) 1 % IJ SOLN
5.0000 mL | Freq: Once | INTRAMUSCULAR | Status: AC
  Administered 2019-02-20: 5 mL

## 2019-02-20 MED ORDER — SODIUM CHLORIDE (PF) 0.9 % IJ SOLN
INTRAMUSCULAR | Status: AC
  Filled 2019-02-20: qty 10

## 2019-02-20 MED ORDER — ROPIVACAINE HCL 2 MG/ML IJ SOLN
INTRAMUSCULAR | Status: AC
  Filled 2019-02-20: qty 10

## 2019-02-20 MED ORDER — SODIUM CHLORIDE 0.9% FLUSH
3.0000 mL | Freq: Once | INTRAVENOUS | Status: AC
  Administered 2019-02-20: 5 mL via INTRAVENOUS

## 2019-02-20 MED ORDER — TRIAMCINOLONE ACETONIDE 40 MG/ML IJ SUSP
INTRAMUSCULAR | Status: AC
  Filled 2019-02-20: qty 1

## 2019-02-20 MED ORDER — LIDOCAINE HCL (PF) 1 % IJ SOLN
INTRAMUSCULAR | Status: AC
  Filled 2019-02-20: qty 10

## 2019-02-20 MED ORDER — TRAMADOL HCL 50 MG PO TABS: 50.0000 mg | ORAL_TABLET | Freq: Four times a day (QID) | ORAL | 3 refills | Status: AC | PRN

## 2019-02-20 MED ORDER — HYDROCODONE-ACETAMINOPHEN 5-325 MG PO TABS: 1.0000 | ORAL_TABLET | Freq: Every day | ORAL | 0 refills | Status: DC

## 2019-02-20 MED ORDER — TRIAMCINOLONE ACETONIDE 40 MG/ML IJ SUSP
40.0000 mg | Freq: Once | INTRAMUSCULAR | Status: AC
  Administered 2019-02-20: 40 mg via INTRAMUSCULAR

## 2019-02-20 MED ORDER — ROPIVACAINE HCL 2 MG/ML IJ SOLN
1.0000 mL | INTRAMUSCULAR | Status: DC
  Administered 2019-02-20: 15:00:00 1 mL via EPIDURAL

## 2019-02-20 MED ORDER — MIDAZOLAM HCL 2 MG/2ML IJ SOLN
2.0000 mg | Freq: Once | INTRAMUSCULAR | Status: AC
  Administered 2019-02-20: 2 mg via INTRAVENOUS
  Filled 2019-02-20: qty 2

## 2019-02-20 NOTE — Progress Notes (Signed)
Subjective:  Patient ID: Carrie Myers, female    DOB: Jul 26, 1945  Age: 73 y.o. MRN: [id number]  CC: Back Pain (left lower)   Procedure: L5-S1 epidural steroid under fluoroscopic guidance with moderate sedation  HPI Carrie Myers presents for reevaluation.  She was last seen several weeks ago and had her first epidural of this year.  She continues to have some lower back pain bilaterally worse on the left side but rated at 75% improvement from her preceding level prior to her injection.  Her left lower extremity pain has also improved.  She is experiencing less left calf cramping.  She is taking her Vicodin once a day and Valium once a day and tramadol 2-3 times per day.  This combination continues to give her good relief of a complicated resistant type of pain.  The quality characteristic and distribution of this pain has been stable in nature.  She is trying to do conservative measures including physical therapy and anti-inflammatories with poor response.  She receives periodic epidural steroid injections to keep the pain under good control and she reports good success with this.  Otherwise she is in her usual state of health at this point.  Outpatient Medications Prior to Visit  Medication Sig Dispense Refill  . aspirin EC 81 MG tablet Take 81 mg by mouth daily.    . citalopram (CELEXA) 20 MG tablet TAKE 1 TABLET BY MOUTH  DAILY 90 tablet 3  . cyclobenzaprine (FLEXERIL) 10 MG tablet TAKE 1 TABLET BY MOUTH 3  TIMES DAILY 270 tablet 0  . Diclofenac-miSOPROStol 75-0.2 MG TBEC TAKE 1 TABLET BY MOUTH TWO  TIMES DAILY 180 tablet 3  . DULoxetine (CYMBALTA) 60 MG capsule Take 1 capsule (60 mg total) by mouth daily. 30 capsule 5  . esomeprazole (NEXIUM) 20 MG capsule Take 40 mg by mouth daily. Over the Counter    . Ibuprofen-diphenhydrAMINE Cit (MOTRIN PM PO) Advil PM  Take 2 tabs po qhs    . levothyroxine (SYNTHROID, LEVOTHROID) 88 MCG tablet TAKE 1 TABLET BY MOUTH  DAILY 90 tablet 3  .  Loperamide HCl (IMODIUM PO) Take by mouth as needed.    Marland Kitchen losartan (COZAAR) 50 MG tablet Take 50 mg by mouth daily.    . Multiple Vitamin (MULTI-VITAMINS) TABS Take by mouth.    . NON FORMULARY     . pravastatin (PRAVACHOL) 80 MG tablet Take 80 mg by mouth daily.    Marland Kitchen tolterodine (DETROL LA) 4 MG 24 hr capsule TAKE 1 CAPSULE(4 MG) BY MOUTH DAILY 30 capsule 12  . diazepam (VALIUM) 5 MG tablet Limit  1/2  tablet by mouth per day or every other day if tolerated 30 tablet 2  . traMADol (ULTRAM) 50 MG tablet TK 1 T PO Q 6 H PRN    . HYDROcodone-acetaminophen (NORCO/VICODIN) 5-325 MG tablet Take 1 tablet by mouth at bedtime. Limit 1-2 tablets by mouth per day if tolerated 30 tablet 0  . HYDROcodone-acetaminophen (NORCO/VICODIN) 5-325 MG tablet Take 1 tablet by mouth daily for 30 days. 30 tablet 0   No facility-administered medications prior to visit.     Review of Systems CNS: No confusion or sedation Cardiac: No angina or palpitations GI: No abdominal pain or constipation Constitutional: No nausea vomiting fevers or chills  Objective:  BP (!) 157/98   Pulse 82   Temp 97.7 F (36.5 C) (Temporal)   Resp 18   Ht 5\' 8"  (1.727 m)   Wt 188 lb (  85.3 kg)   SpO2 100%   BMI 28.59 kg/m    BP Readings from Last 3 Encounters:  02/20/19 (!) 157/98  01/01/19 (!) 145/88  09/18/18 122/68     Wt Readings from Last 3 Encounters:  02/20/19 188 lb (85.3 kg)  01/01/19 188 lb (85.3 kg)  09/18/18 186 lb (84.4 kg)     Physical Exam Pt is alert and oriented PERRL EOMI HEART IS RRR no murmur or rub LCTA no wheezing or rales MUSCULOSKELETAL reveals some paraspinous muscle tenderness but no overt trigger points.  She is ambulating with a mildly antalgic gait and her muscle tone and bulk is at baseline.  Labs  No results found for: HGBA1C Lab Results  Component Value Date   LDLCALC 105 (H) 03/20/2018   CREATININE 0.90 03/20/2018     -------------------------------------------------------------------------------------------------------------------- Lab Results  Component Value Date   WBC 6.7 03/20/2018   HGB 11.8 03/20/2018   HCT 36.0 03/20/2018   PLT 341 03/20/2018   GLUCOSE 97 03/20/2018   CHOL 188 03/20/2018   TRIG 191 (H) 03/20/2018   HDL 45 03/20/2018   LDLCALC 105 (H) 03/20/2018   ALT 28 03/20/2018   AST 30 03/20/2018   NA 143 03/20/2018   K 5.0 03/20/2018   CL 102 03/20/2018   CREATININE 0.90 03/20/2018   BUN 14 03/20/2018   CO2 25 03/20/2018   TSH 3.950 03/20/2018    --------------------------------------------------------------------------------------------------------------------- No results found.   Assessment & Plan:   Carrie Myers was seen today for back pain.  Diagnoses and all orders for this visit:  Chronic bilateral low back pain with bilateral sciatica  DDD (degenerative disc disease), lumbar  Chronic pain syndrome  Fibromyalgia  Chronic, continuous use of opioids  Sciatica, left side  Primary osteoarthritis of both shoulders  Other orders -     HYDROcodone-acetaminophen (NORCO/VICODIN) 5-325 MG tablet; Take 1 tablet by mouth daily. -     diazepam (VALIUM) 5 MG tablet; Take 1 tablet (5 mg total) by mouth daily. -     traMADol (ULTRAM) 50 MG tablet; Take 1 tablet (50 mg total) by mouth every 6 (six) hours as needed.        ----------------------------------------------------------------------------------------------------------------------  Problem List Items Addressed This Visit      Unprioritized   Chronic pain syndrome (Chronic)   Relevant Medications   HYDROcodone-acetaminophen (NORCO/VICODIN) 5-325 MG tablet   traMADol (ULTRAM) 50 MG tablet   DJD of shoulder   Relevant Medications   HYDROcodone-acetaminophen (NORCO/VICODIN) 5-325 MG tablet   traMADol (ULTRAM) 50 MG tablet   Fibromyalgia   Relevant Medications   HYDROcodone-acetaminophen (NORCO/VICODIN)  5-325 MG tablet   traMADol (ULTRAM) 50 MG tablet   Sciatica, left side   Relevant Medications   diazepam (VALIUM) 5 MG tablet    Other Visit Diagnoses    Chronic bilateral low back pain with bilateral sciatica    -  Primary   Relevant Medications   HYDROcodone-acetaminophen (NORCO/VICODIN) 5-325 MG tablet   diazepam (VALIUM) 5 MG tablet   traMADol (ULTRAM) 50 MG tablet   DDD (degenerative disc disease), lumbar       Relevant Medications   HYDROcodone-acetaminophen (NORCO/VICODIN) 5-325 MG tablet   traMADol (ULTRAM) 50 MG tablet   Chronic, continuous use of opioids            ----------------------------------------------------------------------------------------------------------------------  1. Chronic bilateral low back pain with bilateral sciatica We will proceed with a repeat epidural today.  We have gone over the risks and benefits of this  with her in full detail and all questions are answered.  We will have her return to clinic in 2 months for medication refill.  We will consider doing a repeat epidural at the 60-month mark if indicated.  I want her to continue with her stretching strengthening exercises and physical therapy exercises.  2. DDD (degenerative disc disease), lumbar As above  3. Chronic pain syndrome I have reviewed the [facility] practitioner database information and it is appropriate.  I going to refill her medications today for her Vicodin Valium once a day and tramadol 3 times per day.  We will schedule her for 19-month return to clinic.  4. Fibromyalgia Continue core stretching strengthening exercises  5. Chronic, continuous use of opioids As above and continue follow-up with her primary care physicians for her baseline medical care  6. Sciatica, left side As above  7. Primary osteoarthritis of both shoulders As above    ----------------------------------------------------------------------------------------------------------------------  I  have changed [place] Jack. Sam's HYDROcodone-acetaminophen, diazepam, and traMADol. I am also having her maintain her esomeprazole, losartan, pravastatin, aspirin EC, Multi-Vitamins, NON FORMULARY, cyclobenzaprine, DULoxetine, Loperamide HCl (IMODIUM PO), tolterodine, levothyroxine, citalopram, Diclofenac-miSOPROStol, Ibuprofen-diphenhydrAMINE Cit (MOTRIN PM PO), and HYDROcodone-acetaminophen.   Meds ordered this encounter  Medications  . HYDROcodone-acetaminophen (NORCO/VICODIN) 5-325 MG tablet    Sig: Take 1 tablet by mouth daily.    Dispense:  30 tablet    Refill:  0    To be filled once per month..30 day supply  . diazepam (VALIUM) 5 MG tablet    Sig: Take 1 tablet (5 mg total) by mouth daily.    Dispense:  30 tablet    Refill:  2    Needs to last one month  . traMADol (ULTRAM) 50 MG tablet    Sig: Take 1 tablet (50 mg total) by mouth every 6 (six) hours as needed.    Dispense:  90 tablet    Refill:  3   Patient's Medications  New Prescriptions   No medications on file  Previous Medications   ASPIRIN EC 81 MG TABLET    Take 81 mg by mouth daily.   CITALOPRAM (CELEXA) 20 MG TABLET    TAKE 1 TABLET BY MOUTH  DAILY   CYCLOBENZAPRINE (FLEXERIL) 10 MG TABLET    TAKE 1 TABLET BY MOUTH 3  TIMES DAILY   DICLOFENAC-MISOPROSTOL 75-0.2 MG TBEC    TAKE 1 TABLET BY MOUTH TWO  TIMES DAILY   DULOXETINE (CYMBALTA) 60 MG CAPSULE    Take 1 capsule (60 mg total) by mouth daily.   ESOMEPRAZOLE (NEXIUM) 20 MG CAPSULE    Take 40 mg by mouth daily. Over the Counter   HYDROCODONE-ACETAMINOPHEN (NORCO/VICODIN) 5-325 MG TABLET    Take 1 tablet by mouth at bedtime. Limit 1-2 tablets by mouth per day if tolerated   IBUPROFEN-DIPHENHYDRAMINE CIT (MOTRIN PM PO)    Advil PM  Take 2 tabs po qhs   LEVOTHYROXINE (SYNTHROID, LEVOTHROID) 88 MCG TABLET    TAKE 1 TABLET BY MOUTH  DAILY   LOPERAMIDE HCL (IMODIUM PO)    Take by mouth as needed.   LOSARTAN (COZAAR) 50 MG TABLET    Take 50 mg by mouth daily.    MULTIPLE VITAMIN (MULTI-VITAMINS) TABS    Take by mouth.   NON FORMULARY       PRAVASTATIN (PRAVACHOL) 80 MG TABLET    Take 80 mg by mouth daily.   TOLTERODINE (DETROL LA) 4 MG 24 HR CAPSULE    TAKE  1 CAPSULE(4 MG) BY MOUTH DAILY  Modified Medications   Modified Medication Previous Medication   DIAZEPAM (VALIUM) 5 MG TABLET diazepam (VALIUM) 5 MG tablet      Take 1 tablet (5 mg total) by mouth daily.    Limit  1/2  tablet by mouth per day or every other day if tolerated   HYDROCODONE-ACETAMINOPHEN (NORCO/VICODIN) 5-325 MG TABLET HYDROcodone-acetaminophen (NORCO/VICODIN) 5-325 MG tablet      Take 1 tablet by mouth daily.    Take 1 tablet by mouth daily for 30 days.   TRAMADOL (ULTRAM) 50 MG TABLET traMADol (ULTRAM) 50 MG tablet      Take 1 tablet (50 mg total) by mouth every 6 (six) hours as needed.    TK 1 T PO Q 6 H PRN  Discontinued Medications   No medications on file   ----------------------------------------------------------------------------------------------------------------------  Follow-up: Return in about 2 months (around 04/23/2019) for evaluation, med refill.   Procedure: L5-S1 LESI with fluoroscopic guidance and with moderate sedation  NOTE: The risks, benefits, and expectations of the procedure have been discussed and explained to the patient who was understanding and in agreement with suggested treatment plan. No guarantees were made.  DESCRIPTION OF PROCEDURE: Lumbar epidural steroid injection with 3 mg IV Versed, EKG, blood pressure, pulse, and pulse oximetry monitoring. The procedure was performed with the patient in the prone position under fluoroscopic guidance.  Sterile prep x3 was initiated and I then injected subcutaneous lidocaine to the overlying L5-S1 site after its fluoroscopic identifictation.  Using strict aseptic technique, I then advanced an 18-gauge Tuohy epidural needle in the midline using interlaminar approach via loss-of-resistance to saline technique. There  was negative aspiration for heme or  CSF.  I then confirmed position with both AP and Lateral fluoroscan.  2 cc of contrast dye were injected and a  total of 5 mL of Preservative-Free normal saline mixed with 40 mg of Kenalog and 1cc Ropicaine 0.2 percent were injected incrementally via the  epidurally placed needle. The needle was removed. The patient tolerated the injection well and was convalesced and discharged to home in stable condition. Should the patient have any post procedure difficulty they have been instructed on how to contact us for assistance.    Molli Barrows, MD

## 2019-02-20 NOTE — Progress Notes (Signed)
Nursing Pain Medication Assessment:  ?Safety precautions to be maintained throughout the outpatient stay will include: orient to surroundings, keep bed in low position, maintain call bell within reach at all times, provide assistance with transfer out of bed and ambulation.  ?Medication Inspection Compliance: Carrie Myers did not comply with our request to bring her pills to be counted. She was reminded that bringing the medication bottles, even when empty, is a requirement. ? ?Medication: None brought in. ?Pill/Patch Count: None available to be counted. ?Bottle Appearance: No container available. Did not bring bottle(s) to appointment. ?Filled Date: N/A ?Last Medication intake:  Today ?

## 2019-03-05 ENCOUNTER — Other Ambulatory Visit: Payer: Self-pay | Admitting: Internal Medicine

## 2019-03-05 DIAGNOSIS — F331 Major depressive disorder, recurrent, moderate: Secondary | ICD-10-CM

## 2019-03-23 ENCOUNTER — Encounter: Payer: Medicare Other | Admitting: Internal Medicine

## 2019-03-23 ENCOUNTER — Other Ambulatory Visit: Payer: Self-pay | Admitting: Internal Medicine

## 2019-03-28 ENCOUNTER — Ambulatory Visit (INDEPENDENT_AMBULATORY_CARE_PROVIDER_SITE_OTHER): Payer: Medicare Other | Admitting: Internal Medicine

## 2019-03-28 ENCOUNTER — Other Ambulatory Visit: Payer: Self-pay

## 2019-03-28 ENCOUNTER — Encounter: Payer: Self-pay | Admitting: Internal Medicine

## 2019-03-28 VITALS — BP 106/70 | HR 86 | Temp 97.8°F | Ht 68.0 in | Wt 193.0 lb

## 2019-03-28 DIAGNOSIS — Z23 Encounter for immunization: Secondary | ICD-10-CM | POA: Diagnosis not present

## 2019-03-28 DIAGNOSIS — M4696 Unspecified inflammatory spondylopathy, lumbar region: Secondary | ICD-10-CM

## 2019-03-28 DIAGNOSIS — I10 Essential (primary) hypertension: Secondary | ICD-10-CM | POA: Diagnosis not present

## 2019-03-28 DIAGNOSIS — E890 Postprocedural hypothyroidism: Secondary | ICD-10-CM

## 2019-03-28 DIAGNOSIS — K219 Gastro-esophageal reflux disease without esophagitis: Secondary | ICD-10-CM

## 2019-03-28 DIAGNOSIS — Z1231 Encounter for screening mammogram for malignant neoplasm of breast: Secondary | ICD-10-CM

## 2019-03-28 DIAGNOSIS — F331 Major depressive disorder, recurrent, moderate: Secondary | ICD-10-CM

## 2019-03-28 DIAGNOSIS — E782 Mixed hyperlipidemia: Secondary | ICD-10-CM

## 2019-03-28 DIAGNOSIS — Z0000 Encounter for general adult medical examination without abnormal findings: Secondary | ICD-10-CM

## 2019-03-28 LAB — POCT URINALYSIS DIPSTICK
Bilirubin, UA: NEGATIVE
Blood, UA: NEGATIVE
Glucose, UA: NEGATIVE
Ketones, UA: NEGATIVE
Nitrite, UA: NEGATIVE
Protein, UA: NEGATIVE
Spec Grav, UA: 1.02 (ref 1.010–1.025)
Urobilinogen, UA: 0.2 E.U./dL
pH, UA: 5 (ref 5.0–8.0)

## 2019-03-28 NOTE — Progress Notes (Signed)
Date:  03/28/2019   Name:  Carrie Myers   DOB:  09-29-1945   MRN:  [id number]   Chief Complaint: Annual Exam (Breast Exam. No pap- aged out. Flu shot.) Carrie Myers is a 74 y.o. female who presents today for her Complete Annual Exam. She feels fairly well. She reports exercising some walking but not regularly. She reports she is sleeping well.   Mammogram  04/2018 DEXA  04/2018 Colonoscopy  03/2018 Immunization History  Administered Date(s) Administered  . Influenza, High Dose Seasonal PF 12/31/2016, 12/05/2017  . Pneumococcal Conjugate-13 01/30/2014  . Pneumococcal Polysaccharide-23 09/22/2012  . Tdap 09/08/2011    Hypertension This is a chronic problem. The problem is controlled. Pertinent negatives include no chest pain, headaches, palpitations or shortness of breath. Past treatments include angiotensin blockers. The current treatment provides significant improvement. Identifiable causes of hypertension include a thyroid problem.  Hyperlipidemia This is a chronic problem. The problem is resistant. Associated symptoms include myalgias. Pertinent negatives include no chest pain or shortness of breath. Current antihyperlipidemic treatment includes statins. The current treatment provides moderate improvement of lipids.  Gastroesophageal Reflux She reports no abdominal pain, no chest pain, no coughing or no wheezing. This is a chronic problem. The problem occurs occasionally. Pertinent negatives include no fatigue. She has tried a PPI for the symptoms. The treatment provided significant relief.  Thyroid Problem Presents for follow-up visit. Patient reports no anxiety, constipation, diarrhea, fatigue, palpitations or tremors. The symptoms have been stable. Her past medical history is significant for hyperlipidemia.  Depression        This is a chronic problem.The problem is unchanged.  Associated symptoms include myalgias.  Associated symptoms include no fatigue and no headaches.   Past treatments include SSRIs - Selective serotonin reuptake inhibitors and SNRIs - Serotonin and norepinephrine reuptake inhibitors.  Compliance with treatment is good.  Previous treatment provided significant relief.  Past medical history includes thyroid problem.     Lab Results  Component Value Date   CREATININE 0.90 03/20/2018   BUN 14 03/20/2018   NA 143 03/20/2018   K 5.0 03/20/2018   CL 102 03/20/2018   CO2 25 03/20/2018   Lab Results  Component Value Date   CHOL 188 03/20/2018   HDL 45 03/20/2018   LDLCALC 105 (H) 03/20/2018   TRIG 191 (H) 03/20/2018   CHOLHDL 4.2 03/20/2018   Lab Results  Component Value Date   TSH 3.950 03/20/2018   No results found for: HGBA1C   Review of Systems  Constitutional: Negative for chills, fatigue and fever.  HENT: Negative for congestion, hearing loss, tinnitus, trouble swallowing and voice change.   Eyes: Negative for visual disturbance.  Respiratory: Negative for cough, chest tightness, shortness of breath and wheezing.   Cardiovascular: Negative for chest pain, palpitations and leg swelling.  Gastrointestinal: Negative for abdominal pain, constipation, diarrhea and vomiting.  Endocrine: Negative for polydipsia and polyuria.  Genitourinary: Positive for frequency. Negative for dysuria, genital sores, vaginal bleeding and vaginal discharge.  Musculoskeletal: Positive for arthralgias, back pain and myalgias. Negative for gait problem and joint swelling.  Skin: Negative for color change and rash.  Neurological: Negative for dizziness, tremors, light-headedness and headaches.  Hematological: Negative for adenopathy. Does not bruise/bleed easily.  Psychiatric/Behavioral: Positive for depression. Negative for dysphoric mood and sleep disturbance. The patient is not nervous/anxious.     Patient Active Problem List   Diagnosis Date Noted  . Sciatica, left side 01/01/2019  . Urinary urgency 05/12/2018  .  Urinary frequency 05/12/2018    . Diverticulosis of colon 04/25/2018  . Change in bowel habits   . Tobacco use disorder, moderate, in sustained remission 12/22/2017  . Moderate episode of recurrent major depressive disorder ([place]) 10/03/2017  . Inflammatory spondylopathy of lumbar region ([place]) 10/03/2017  . Hoarseness, persistent 04/18/2017  . OSA on CPAP 02/18/2017  . Cervical disc disease 12/31/2016  . Generalized osteoarthritis 12/23/2016  . Chronic pain syndrome 12/23/2016  . Cystocele, midline 12/06/2016  . Urinary incontinence 12/06/2016  . Impingement syndrome of right shoulder 04/17/2015  . Fibrocystic breast 01/18/2015  . Gastroesophageal reflux disease without esophagitis 01/18/2015  . Hypothyroidism, postablative 01/18/2015  . Arteriosclerosis of coronary artery 12/29/2014  . DJD of shoulder 11/13/2014  . Fibromyalgia 10/02/2014  . Intercostal neuralgia 10/02/2014  . Facet syndrome, lumbar 10/02/2014  . Sacroiliac joint disease 10/02/2014  . Carotid artery narrowing 07/09/2014  . Benign essential HTN 07/01/2014  . Osteoarthritis of thumb 09/24/2012  . Mixed hyperlipidemia 09/22/2012  . Arthritis of hand, degenerative 09/22/2012    Allergies  Allergen Reactions  . Augmentin [Amoxicillin-Pot Clavulanate] Nausea And Vomiting  . Codeine Nausea And Vomiting  . Sulfa Antibiotics Nausea And Vomiting  . Proparacaine Itching  . Shellfish Allergy Nausea And Vomiting    No issues with iodine     Past Surgical History:  Procedure Laterality Date  . ABDOMINAL HYSTERECTOMY     total  . APPENDECTOMY    . BREAST BIOPSY Right    neg  . BREAST CYST ASPIRATION Left    neg  . BUNIONECTOMY Bilateral   . CESAREAN SECTION    . COLONOSCOPY  2010   normal  . COLONOSCOPY WITH PROPOFOL N/A 04/03/2018   Procedure: COLONOSCOPY WITH BIOPSIES;  Surgeon: Lucilla Lame, MD;  Location: [facility];  Service: Endoscopy;  Laterality: N/A;  sleep apnea  . CORONARY ANGIOPLASTY WITH STENT PLACEMENT  04/2006  .  dental implant     [age]  . epidural steroid injection  2018  . SHOULDER ARTHROSCOPY Right 05/07/2015   Procedure: Extensive arthroscopic debridement and arthroscopic subacromial decompression, right shoulder.  2. Steroid injection right thumb CMC joint.;  Surgeon: Corky Mull, MD;  Location: [place];  Service: Orthopedics;  Laterality: Right;  . TOOTH EXTRACTION     Upper centrals and lateral    Social History   Tobacco Use  . Smoking status: Former Smoker    Packs/day: 1.25    Years: 44.00    Pack years: 55.00    Types: Cigarettes    Quit date: 04/22/2006    Years since quitting: 12.9  . Smokeless tobacco: Never Used  . Tobacco comment:    Substance Use Topics  . Alcohol use: No    Alcohol/week: 0.0 standard drinks  . Drug use: No     Medication list has been reviewed and updated.  Current Meds  Medication Sig  . aspirin EC 81 MG tablet Take 81 mg by mouth daily.  . citalopram (CELEXA) 20 MG tablet TAKE 1 TABLET BY MOUTH  DAILY  . cyclobenzaprine (FLEXERIL) 10 MG tablet TAKE 1 TABLET BY MOUTH 3  TIMES DAILY  . Diclofenac-miSOPROStol 75-0.2 MG TBEC TAKE 1 TABLET BY MOUTH TWO  TIMES DAILY  . DULoxetine (CYMBALTA) 60 MG capsule TAKE 1 CAPSULE(60 MG) BY MOUTH DAILY  . esomeprazole (NEXIUM) 20 MG capsule Take 40 mg by mouth daily. Over the Counter  . Ibuprofen-diphenhydrAMINE Cit (MOTRIN PM PO) Advil PM  Take 2 tabs po qhs  .  levothyroxine (SYNTHROID) 88 MCG tablet TAKE 1 TABLET BY MOUTH  DAILY  . Loperamide HCl (IMODIUM PO) Take by mouth as needed.  Marland Kitchen losartan (COZAAR) 50 MG tablet Take 50 mg by mouth daily.  . Multiple Vitamin (MULTI-VITAMINS) TABS Take by mouth.  . NON FORMULARY   . pravastatin (PRAVACHOL) 80 MG tablet Take 80 mg by mouth daily.  Marland Kitchen tolterodine (DETROL LA) 4 MG 24 hr capsule TAKE 1 CAPSULE(4 MG) BY MOUTH DAILY    PHQ 2/9 Scores 03/28/2019 01/01/2019 09/18/2018 05/08/2018  PHQ - 2 Score 0 0 0 0  PHQ- 9 Score 0 - 3 -    Exception Documentation - - - -    BP Readings from Last 3 Encounters:  03/28/19 106/70  02/20/19 (!) 145/88  01/01/19 (!) 145/88    Physical Exam Vitals and nursing note reviewed.  Constitutional:      General: She is not in acute distress.    Appearance: She is well-developed.  HENT:     Head: Normocephalic and atraumatic.     Right Ear: Tympanic membrane and ear canal normal.     Left Ear: Tympanic membrane and ear canal normal.     Nose:     Right Sinus: No maxillary sinus tenderness.     Left Sinus: No maxillary sinus tenderness.  Eyes:     General: No scleral icterus.       Right eye: No discharge.        Left eye: No discharge.     Conjunctiva/sclera: Conjunctivae normal.  Neck:     Thyroid: No thyromegaly.     Vascular: No carotid bruit.  Cardiovascular:     Rate and Rhythm: Normal rate and regular rhythm.     Pulses: Normal pulses.     Heart sounds: Normal heart sounds.  Pulmonary:     Effort: Pulmonary effort is normal. No respiratory distress.     Breath sounds: No wheezing.  Chest:     Breasts:        Right: No mass, nipple discharge, skin change or tenderness.        Left: No mass, nipple discharge, skin change or tenderness.  Abdominal:     General: Bowel sounds are normal.     Palpations: Abdomen is soft.     Tenderness: There is no abdominal tenderness.  Musculoskeletal:        General: Normal range of motion.     Right hand: Swelling, deformity and bony tenderness present. No tenderness.     Left hand: Swelling, deformity and bony tenderness present. No tenderness.     Cervical back: Normal range of motion. No erythema.  Lymphadenopathy:     Cervical: No cervical adenopathy.  Skin:    General: Skin is warm and dry.     Capillary Refill: Capillary refill takes less than 2 seconds.     Findings: No rash.  Neurological:     Mental Status: She is alert and oriented to person, place, and time.     Cranial Nerves: No cranial nerve deficit.      Sensory: No sensory deficit.     Deep Tendon Reflexes: Reflexes are normal and symmetric.  Psychiatric:        Attention and Perception: Attention normal.        Mood and Affect: Mood normal.        Speech: Speech normal.        Behavior: Behavior normal.        Thought Content:  Thought content normal.     Wt Readings from Last 3 Encounters:  03/28/19 193 lb (87.5 kg)  02/20/19 188 lb (85.3 kg)  01/01/19 188 lb (85.3 kg)    BP 106/70   Pulse 86   Temp 97.8 F (36.6 C) (Oral)   Ht 5\' 8"  (1.727 m)   Wt 193 lb (87.5 kg)   SpO2 96%   BMI 29.35 kg/m   Assessment and Plan: 1. Annual physical exam Normal exam except for mild weight gain Recommend more regular exercise, continue healthy diet choices  2. Benign essential HTN Clinically stable exam with well controlled BP on losartan 50 mg. Tolerating medications without side effects at this time. Pt to continue current regimen and low sodium diet; benefits of regular exercise as able discussed. - CBC with Differential/Platelet - Comprehensive metabolic panel - POCT urinalysis dipstick  3. Hypothyroidism, postablative Supplemented, no s/s of poor control Will check labs and advise - TSH + free T4  4. Mixed hyperlipidemia Tolerating statin medication without side effects at this time Continue same therapy without change at this time. - Lipid panel  5. Gastroesophageal reflux disease without esophagitis Symptoms well controlled on daily PPI No red flag signs such as weight loss, n/v, melena Will continue nexium 40 mg daily - CBC with Differential/Platelet  6. Moderate episode of recurrent major depressive disorder (HCC) Clinically doing very well on dual therapy with cymbalta and celexa  7. Encounter for screening mammogram for breast cancer To be scheduled at [place]; Future  8. Inflammatory spondylopathy of lumbar region [facility]) Followed by Pain management with intermittent ESI Also on  daily nsaids and muscle relaxants   Partially dictated using Editor, commissioning. Any errors are unintentional.  Halina Maidens, MD [facility]  03/28/2019

## 2019-03-29 LAB — COMPREHENSIVE METABOLIC PANEL
ALT: 31 IU/L (ref 0–32)
AST: 30 IU/L (ref 0–40)
Albumin/Globulin Ratio: 2 (ref 1.2–2.2)
Albumin: 4.4 g/dL (ref 3.7–4.7)
Alkaline Phosphatase: 78 IU/L (ref 39–117)
BUN/Creatinine Ratio: 26 (ref 12–28)
BUN: 20 mg/dL (ref 8–27)
Bilirubin Total: 0.3 mg/dL (ref 0.0–1.2)
CO2: 25 mmol/L (ref 20–29)
Calcium: 9.4 mg/dL (ref 8.7–10.3)
Chloride: 101 mmol/L (ref 96–106)
Creatinine, Ser: 0.78 mg/dL (ref 0.57–1.00)
GFR calc Af Amer: 88 mL/min/{1.73_m2} (ref >59)
GFR calc non Af Amer: 77 mL/min/{1.73_m2} (ref >59)
Globulin, Total: 2.2 g/dL (ref 1.5–4.5)
Glucose: 89 mg/dL (ref 65–99)
Potassium: 4.2 mmol/L (ref 3.5–5.2)
Sodium: 139 mmol/L (ref 134–144)
Total Protein: 6.6 g/dL (ref 6.0–8.5)

## 2019-03-29 LAB — CBC WITH DIFFERENTIAL/PLATELET
Basophils Absolute: 0.1 10*3/uL (ref 0.0–0.2)
Basos: 1 %
EOS (ABSOLUTE): 0.3 10*3/uL (ref 0.0–0.4)
Eos: 5 %
Hematocrit: 36.2 % (ref 34.0–46.6)
Hemoglobin: 12.6 g/dL (ref 11.1–15.9)
Immature Grans (Abs): 0 10*3/uL (ref 0.0–0.1)
Immature Granulocytes: 1 %
Lymphocytes Absolute: 1.5 10*3/uL (ref 0.7–3.1)
Lymphs: 30 %
MCH: 31.3 pg (ref 26.6–33.0)
MCHC: 34.8 g/dL (ref 31.5–35.7)
MCV: 90 fL (ref 79–97)
Monocytes Absolute: 0.5 10*3/uL (ref 0.1–0.9)
Monocytes: 10 %
Neutrophils Absolute: 2.7 10*3/uL (ref 1.4–7.0)
Neutrophils: 53 %
Platelets: 250 10*3/uL (ref 150–450)
RBC: 4.03 x10E6/uL (ref 3.77–5.28)
RDW: 12.2 % (ref 11.7–15.4)
WBC: 5.1 10*3/uL (ref 3.4–10.8)

## 2019-03-29 LAB — LIPID PANEL
Chol/HDL Ratio: 2.6 ratio (ref 0.0–4.4)
Cholesterol, Total: 111 mg/dL (ref 100–199)
HDL: 43 mg/dL (ref >39)
LDL Chol Calc (NIH): 47 mg/dL (ref 0–99)
Triglycerides: 119 mg/dL (ref 0–149)
VLDL Cholesterol Cal: 21 mg/dL (ref 5–40)

## 2019-03-29 LAB — TSH+FREE T4
Free T4: 1.14 ng/dL (ref 0.82–1.77)
TSH: 2.26 u[IU]/mL (ref 0.450–4.500)

## 2019-04-19 ENCOUNTER — Encounter: Payer: Self-pay | Admitting: Internal Medicine

## 2019-04-19 ENCOUNTER — Ambulatory Visit (INDEPENDENT_AMBULATORY_CARE_PROVIDER_SITE_OTHER): Payer: Medicare Other | Admitting: Internal Medicine

## 2019-04-19 ENCOUNTER — Encounter: Payer: Self-pay | Admitting: Emergency Medicine

## 2019-04-19 ENCOUNTER — Ambulatory Visit: Payer: Medicare Other | Attending: Internal Medicine

## 2019-04-19 ENCOUNTER — Emergency Department: Payer: Medicare Other

## 2019-04-19 ENCOUNTER — Emergency Department
Admission: EM | Admit: 2019-04-19 | Discharge: 2019-04-20 | Disposition: A | Discharge status: Home / Self Care | Payer: Medicare Other | Attending: Emergency Medicine | Admitting: Emergency Medicine

## 2019-04-19 ENCOUNTER — Other Ambulatory Visit: Payer: Self-pay

## 2019-04-19 VITALS — Temp 99.4°F | Ht 68.0 in | Wt 193.0 lb

## 2019-04-19 DIAGNOSIS — I10 Essential (primary) hypertension: Secondary | ICD-10-CM | POA: Diagnosis not present

## 2019-04-19 DIAGNOSIS — R05 Cough: Secondary | ICD-10-CM | POA: Diagnosis not present

## 2019-04-19 DIAGNOSIS — E860 Dehydration: Secondary | ICD-10-CM | POA: Diagnosis not present

## 2019-04-19 DIAGNOSIS — Z7982 Long term (current) use of aspirin: Secondary | ICD-10-CM | POA: Insufficient documentation

## 2019-04-19 DIAGNOSIS — R110 Nausea: Secondary | ICD-10-CM | POA: Diagnosis not present

## 2019-04-19 DIAGNOSIS — Z79899 Other long term (current) drug therapy: Secondary | ICD-10-CM | POA: Insufficient documentation

## 2019-04-19 DIAGNOSIS — R0602 Shortness of breath: Secondary | ICD-10-CM

## 2019-04-19 DIAGNOSIS — Z20822 Contact with and (suspected) exposure to covid-19: Secondary | ICD-10-CM | POA: Insufficient documentation

## 2019-04-19 DIAGNOSIS — U071 COVID-19: Secondary | ICD-10-CM

## 2019-04-19 DIAGNOSIS — J069 Acute upper respiratory infection, unspecified: Secondary | ICD-10-CM

## 2019-04-19 DIAGNOSIS — R112 Nausea with vomiting, unspecified: Secondary | ICD-10-CM

## 2019-04-19 DIAGNOSIS — R531 Weakness: Secondary | ICD-10-CM | POA: Diagnosis not present

## 2019-04-19 DIAGNOSIS — N3000 Acute cystitis without hematuria: Secondary | ICD-10-CM

## 2019-04-19 DIAGNOSIS — Z209 Contact with and (suspected) exposure to unspecified communicable disease: Secondary | ICD-10-CM | POA: Diagnosis not present

## 2019-04-19 DIAGNOSIS — R000 Tachycardia, unspecified: Secondary | ICD-10-CM | POA: Diagnosis not present

## 2019-04-19 DIAGNOSIS — R42 Dizziness and giddiness: Secondary | ICD-10-CM | POA: Diagnosis not present

## 2019-04-19 DIAGNOSIS — Z87891 Personal history of nicotine dependence: Secondary | ICD-10-CM | POA: Insufficient documentation

## 2019-04-19 LAB — BASIC METABOLIC PANEL
Anion gap: 9 (ref 5–15)
BUN: 24 mg/dL — ABNORMAL HIGH (ref 8–23)
CO2: 26 mmol/L (ref 22–32)
Calcium: 8.7 mg/dL — ABNORMAL LOW (ref 8.9–10.3)
Chloride: 99 mmol/L (ref 98–111)
Creatinine, Ser: 1.2 mg/dL — ABNORMAL HIGH (ref 0.44–1.00)
GFR calc Af Amer: 53 mL/min — ABNORMAL LOW (ref >60)
GFR calc non Af Amer: 45 mL/min — ABNORMAL LOW (ref >60)
Glucose, Bld: 143 mg/dL — ABNORMAL HIGH (ref 70–99)
Potassium: 3.9 mmol/L (ref 3.5–5.1)
Sodium: 134 mmol/L — ABNORMAL LOW (ref 135–145)

## 2019-04-19 LAB — CBC
HCT: 37 % (ref 36.0–46.0)
Hemoglobin: 12.3 g/dL (ref 12.0–15.0)
MCH: 31.1 pg (ref 26.0–34.0)
MCHC: 33.2 g/dL (ref 30.0–36.0)
MCV: 93.7 fL (ref 80.0–100.0)
Platelets: 199 10*3/uL (ref 150–400)
RBC: 3.95 MIL/uL (ref 3.87–5.11)
RDW: 12.7 % (ref 11.5–15.5)
WBC: 10.4 10*3/uL (ref 4.0–10.5)
nRBC: 0 % (ref 0.0–0.2)

## 2019-04-19 LAB — TROPONIN I (HIGH SENSITIVITY): Troponin I (High Sensitivity): 6 ng/L (ref <18)

## 2019-04-19 MED ORDER — ONDANSETRON 4 MG PO TBDP: 4.0000 mg | ORAL_TABLET | Freq: Three times a day (TID) | ORAL | 0 refills | Status: DC | PRN

## 2019-04-19 MED ORDER — ACETAMINOPHEN 500 MG PO TABS
ORAL_TABLET | ORAL | Status: AC
  Filled 2019-04-19: qty 2

## 2019-04-19 MED ORDER — ACETAMINOPHEN 500 MG PO TABS
1000.0000 mg | ORAL_TABLET | Freq: Once | ORAL | Status: AC
  Administered 2019-04-19: 22:00:00 1000 mg via ORAL

## 2019-04-19 NOTE — Progress Notes (Signed)
Date:  04/19/2019   Name:  Carrie Myers   DOB:  1945-06-22   MRN:  [id number]  I connected with this patient, Carrie Myers, by telephone at the patient's home.  I verified that I am speaking with the correct person using two identifiers. This visit was conducted via telephone due to the Covid-19 outbreak from my office at [facility] in [place], [place]. I discussed the limitations, risks, security and privacy concerns of performing an evaluation and management service by telephone. I also discussed with the patient that there may be a patient responsible charge related to this service. The patient expressed understanding and agreed to proceed.  Chief Complaint: Cough (Started Tuesday at lunch time. Cough- no production. Body aches. Headache. Weakness/fatigue. Loss of appetite. Dry mouth- severe. Throat is irritated. Low grade fever. Patient feels she has the flu. Still can taste and smell. Cold chills to the point that teeth were chattering this morning when she woke up. Also, hot sweats.)  Cough This is a new problem. Episode onset: 2 days ago. The cough is non-productive. Associated symptoms include chills, a fever (99.4 today), headaches and myalgias. Pertinent negatives include no chest pain, shortness of breath or wheezing. She has tried rest (mostly drinking plain water) for the symptoms.    Lab Results  Component Value Date   CREATININE 0.78 03/28/2019   BUN 20 03/28/2019   NA 139 03/28/2019   K 4.2 03/28/2019   CL 101 03/28/2019   CO2 25 03/28/2019   Lab Results  Component Value Date   CHOL 111 03/28/2019   HDL 43 03/28/2019   LDLCALC 47 03/28/2019   TRIG 119 03/28/2019   CHOLHDL 2.6 03/28/2019   Lab Results  Component Value Date   TSH 2.260 03/28/2019   No results found for: HGBA1C   Review of Systems  Constitutional: Positive for appetite change, chills, fatigue and fever (99.4 today).  HENT: Positive for mouth sores (dry mouth). Negative for trouble  swallowing.        No loss of taste or smell  Respiratory: Positive for cough. Negative for shortness of breath and wheezing.   Cardiovascular: Negative for chest pain and palpitations.  Gastrointestinal: Positive for diarrhea, nausea and vomiting (once). Negative for abdominal pain.  Musculoskeletal: Positive for myalgias.  Neurological: Positive for weakness, light-headedness and headaches. Negative for dizziness.    Patient Active Problem List   Diagnosis Date Noted  . Sciatica, left side 01/01/2019  . Urinary urgency 05/12/2018  . Urinary frequency 05/12/2018  . Diverticulosis of colon 04/25/2018  . Change in bowel habits   . Tobacco use disorder, moderate, in sustained remission 12/22/2017  . Moderate episode of recurrent major depressive disorder ([place]) 10/03/2017  . Inflammatory spondylopathy of lumbar region ([place]) 10/03/2017  . Hoarseness, persistent 04/18/2017  . OSA on CPAP 02/18/2017  . Cervical disc disease 12/31/2016  . Generalized osteoarthritis 12/23/2016  . Chronic pain syndrome 12/23/2016  . Cystocele, midline 12/06/2016  . Urinary incontinence 12/06/2016  . Impingement syndrome of right shoulder 04/17/2015  . Fibrocystic breast 01/18/2015  . Gastroesophageal reflux disease without esophagitis 01/18/2015  . Hypothyroidism, postablative 01/18/2015  . Arteriosclerosis of coronary artery 12/29/2014  . DJD of shoulder 11/13/2014  . Fibromyalgia 10/02/2014  . Intercostal neuralgia 10/02/2014  . Facet syndrome, lumbar 10/02/2014  . Sacroiliac joint disease 10/02/2014  . Carotid artery narrowing 07/09/2014  . Benign essential HTN 07/01/2014  . Osteoarthritis of thumb 09/24/2012  . Mixed hyperlipidemia 09/22/2012  .  Arthritis of hand, degenerative 09/22/2012    Allergies  Allergen Reactions  . Augmentin [Amoxicillin-Pot Clavulanate] Nausea And Vomiting  . Codeine Nausea And Vomiting  . Sulfa Antibiotics Nausea And Vomiting  . Proparacaine Itching  . Shellfish  Allergy Nausea And Vomiting    No issues with iodine     Past Surgical History:  Procedure Laterality Date  . ABDOMINAL HYSTERECTOMY     total  . APPENDECTOMY    . BREAST BIOPSY Right    neg  . BREAST CYST ASPIRATION Left    neg  . BUNIONECTOMY Bilateral   . CESAREAN SECTION    . COLONOSCOPY  2010   normal  . COLONOSCOPY WITH PROPOFOL N/A 04/03/2018   Procedure: COLONOSCOPY WITH BIOPSIES;  Surgeon: Lucilla Lame, MD;  Location: [facility];  Service: Endoscopy;  Laterality: N/A;  sleep apnea  . CORONARY ANGIOPLASTY WITH STENT PLACEMENT  04/2006  . dental implant     [age]  . epidural steroid injection  2018  . SHOULDER ARTHROSCOPY Right 05/07/2015   Procedure: Extensive arthroscopic debridement and arthroscopic subacromial decompression, right shoulder.  2. Steroid injection right thumb CMC joint.;  Surgeon: Corky Mull, MD;  Location: [place] [facility];  Service: Orthopedics;  Laterality: Right;  . TOOTH EXTRACTION     Upper centrals and lateral    Social History   Tobacco Use  . Smoking status: Former Smoker    Packs/day: 1.25    Years: 44.00    Pack years: 55.00    Types: Cigarettes    Quit date: 04/22/2006    Years since quitting: 13.0  . Smokeless tobacco: Never Used  . Tobacco comment:    Substance Use Topics  . Alcohol use: No    Alcohol/week: 0.0 standard drinks  . Drug use: No     Medication list has been reviewed and updated.  Current Meds  Medication Sig  . aspirin EC 81 MG tablet Take 81 mg by mouth daily.  . citalopram (CELEXA) 20 MG tablet TAKE 1 TABLET BY MOUTH  DAILY  . cyclobenzaprine (FLEXERIL) 10 MG tablet TAKE 1 TABLET BY MOUTH 3  TIMES DAILY  . diazepam (VALIUM) 5 MG tablet as needed.  . Diclofenac-miSOPROStol 75-0.2 MG TBEC TAKE 1 TABLET BY MOUTH TWO  TIMES DAILY  . DULoxetine (CYMBALTA) 60 MG capsule TAKE 1 CAPSULE(60 MG) BY MOUTH DAILY  . esomeprazole (NEXIUM) 20 MG capsule Take 40 mg by mouth daily. Over  the Counter  . Ibuprofen-diphenhydrAMINE Cit (MOTRIN PM PO) Advil PM  Take 2 tabs po qhs  . levothyroxine (SYNTHROID) 88 MCG tablet TAKE 1 TABLET BY MOUTH  DAILY  . Loperamide HCl (IMODIUM PO) Take by mouth as needed.  [place] Kitchen losartan (COZAAR) 50 MG tablet Take 50 mg by mouth daily.  . Multiple Vitamin (MULTI-VITAMINS) TABS Take by mouth.  . NON FORMULARY   . pravastatin (PRAVACHOL) 80 MG tablet Take 80 mg by mouth daily.  [place] Kitchen tolterodine (DETROL LA) 4 MG 24 hr capsule TAKE 1 CAPSULE(4 MG) BY MOUTH DAILY  . traMADol (ULTRAM) 50 MG tablet daily as needed.    PHQ 2/9 Scores 04/19/2019 03/28/2019 01/01/2019 09/18/2018  PHQ - 2 Score 0 0 0 0  PHQ- 9 Score 5 0 - 3  Exception Documentation - - - -    BP Readings from Last 3 Encounters:  03/28/19 106/70  02/20/19 (!) 145/88  01/01/19 (!) 145/88    Physical Exam Constitutional:      General: She is not  in acute distress. Pulmonary:     Effort: Pulmonary effort is normal.     Comments: No dyspnea appreciated, no cough heard Neurological:     Mental Status: She is alert.  Psychiatric:        Attention and Perception: Attention normal.        Mood and Affect: Mood normal.     Wt Readings from Last 3 Encounters:  04/19/19 193 lb (87.5 kg)  03/28/19 193 lb (87.5 kg)  02/20/19 188 lb (85.3 kg)    Temp 99.4 F (37.4 C) (Oral)   Ht 5\' 8"  (1.727 m)   Wt 193 lb (87.5 kg)   BMI 29.35 kg/m   Assessment and Plan: 1. COVID-19 virus infection Covid suspected Recommend Tylenol, fluids with electrolytes like gatorade, V-8, chicken soup and juice Rest Quarantine until results of testing known Instructions to Covid testing given  2. Non-intractable vomiting with nausea, unspecified vomiting type - ondansetron (ZOFRAN ODT) 4 MG disintegrating tablet; Take 1 tablet (4 mg total) by mouth every 8 (eight) hours as needed for nausea or vomiting.  Dispense: 30 tablet; Refill: 0   Partially dictated using Editor, commissioning. Any errors are  unintentional.  Halina Maidens, MD [facility]  04/19/2019

## 2019-04-19 NOTE — ED Triage Notes (Signed)
Pt arrived via EMS from home where pt called out due to generalized weakness, cough, SOB, fever since Tuesday. Pt seen at PCP today and had COVID swab, pending results.

## 2019-04-20 ENCOUNTER — Emergency Department: Payer: Medicare Other

## 2019-04-20 DIAGNOSIS — N3000 Acute cystitis without hematuria: Secondary | ICD-10-CM | POA: Diagnosis not present

## 2019-04-20 DIAGNOSIS — R000 Tachycardia, unspecified: Secondary | ICD-10-CM | POA: Diagnosis not present

## 2019-04-20 DIAGNOSIS — J069 Acute upper respiratory infection, unspecified: Secondary | ICD-10-CM | POA: Diagnosis not present

## 2019-04-20 DIAGNOSIS — E860 Dehydration: Secondary | ICD-10-CM | POA: Diagnosis not present

## 2019-04-20 DIAGNOSIS — R05 Cough: Secondary | ICD-10-CM | POA: Diagnosis not present

## 2019-04-20 LAB — URINALYSIS, COMPLETE (UACMP) WITH MICROSCOPIC
Bilirubin Urine: NEGATIVE
Glucose, UA: NEGATIVE mg/dL
Ketones, ur: NEGATIVE mg/dL
Nitrite: NEGATIVE
Protein, ur: NEGATIVE mg/dL
Specific Gravity, Urine: 1.004 — ABNORMAL LOW (ref 1.005–1.030)
WBC, UA: >50 WBC/hpf — ABNORMAL HIGH (ref 0–5)
pH: 6 (ref 5.0–8.0)

## 2019-04-20 LAB — RESPIRATORY PANEL BY RT PCR (FLU A&B, COVID)
Influenza A by PCR: NEGATIVE
Influenza B by PCR: NEGATIVE
SARS Coronavirus 2 by RT PCR: NEGATIVE

## 2019-04-20 LAB — POC SARS CORONAVIRUS 2 AG: SARS Coronavirus 2 Ag: NEGATIVE

## 2019-04-20 LAB — NOVEL CORONAVIRUS, NAA: SARS-CoV-2, NAA: NOT DETECTED

## 2019-04-20 MED ORDER — SODIUM CHLORIDE 0.9 % IV BOLUS
1000.0000 mL | Freq: Once | INTRAVENOUS | Status: AC
  Administered 2019-04-20: 1000 mL via INTRAVENOUS

## 2019-04-20 MED ORDER — CEPHALEXIN 500 MG PO CAPS: 500.0000 mg | ORAL_CAPSULE | Freq: Two times a day (BID) | ORAL | 0 refills | Status: AC

## 2019-04-20 MED ORDER — SODIUM CHLORIDE 0.9 % IV BOLUS
1000.0000 mL | Freq: Once | INTRAVENOUS | Status: AC
  Administered 2019-04-20: 02:00:00 1000 mL via INTRAVENOUS

## 2019-04-20 MED ORDER — ACETAMINOPHEN 500 MG PO TABS
1000.0000 mg | ORAL_TABLET | Freq: Once | ORAL | Status: AC
  Administered 2019-04-20: 02:00:00 1000 mg via ORAL
  Filled 2019-04-20: qty 2

## 2019-04-20 MED ORDER — CEPHALEXIN 500 MG PO CAPS
500.0000 mg | ORAL_CAPSULE | Freq: Once | ORAL | Status: AC
  Administered 2019-04-20: 03:00:00 500 mg via ORAL
  Filled 2019-04-20: qty 1

## 2019-04-20 NOTE — ED Provider Notes (Signed)
[facility] Emergency Department Provider Note  ____________________________________________   First MD Initiated Contact with Patient 04/20/19 0002     (approximate)  I have reviewed the triage vital signs and the nursing notes.   HISTORY  Chief Complaint Weakness   HPI Carrie Myers is a 74 y.o. female with below list of previous medical conditions presents emergency department secondary to generalized weakness dyspnea cough and fever which patient states began on Tuesday.  Patient admits to episodes of vomiting.          Past Medical History:  Diagnosis Date  . Allergy   . Anxiety   . Arthritis    neck, hands, lower back  . Bilateral dry eyes   . Chronic low back pain 12/23/2016  . DDD (degenerative disc disease), lumbar 10/02/2014  . Dental crowns present    Implants, front "flipper" while waiting for other implants  . Fibromyalgia   . Fibromyalgia   . Fracture of right foot 01/14/2015  . GERD (gastroesophageal reflux disease)   . Hyperlipidemia   . Hypertension   . Kidney stones   . Myocardial infarction ([place]) 04/22/2006  . Osteoarthritis   . Other shoulder lesions, right shoulder 04/17/2015  . Rheumatoid arthritis ([place])   . Sciatica, left side 01/01/2019  . Sleep apnea    CPAP  . Status post shoulder surgery 05/27/2015  . Thyroid disease   . Vertigo    none for approx 9 yrs    Patient Active Problem List   Diagnosis Date Noted  . Sciatica, left side 01/01/2019  . Urinary urgency 05/12/2018  . Urinary frequency 05/12/2018  . Diverticulosis of colon 04/25/2018  . Change in bowel habits   . Tobacco use disorder, moderate, in sustained remission 12/22/2017  . Moderate episode of recurrent major depressive disorder ([place]) 10/03/2017  . Inflammatory spondylopathy of lumbar region ([place]) 10/03/2017  . Hoarseness, persistent 04/18/2017  . OSA on CPAP 02/18/2017  . Cervical disc disease 12/31/2016  . Generalized osteoarthritis  12/23/2016  . Chronic pain syndrome 12/23/2016  . Cystocele, midline 12/06/2016  . Urinary incontinence 12/06/2016  . Impingement syndrome of right shoulder 04/17/2015  . Fibrocystic breast 01/18/2015  . Gastroesophageal reflux disease without esophagitis 01/18/2015  . Hypothyroidism, postablative 01/18/2015  . Arteriosclerosis of coronary artery 12/29/2014  . DJD of shoulder 11/13/2014  . Fibromyalgia 10/02/2014  . Intercostal neuralgia 10/02/2014  . Facet syndrome, lumbar 10/02/2014  . Sacroiliac joint disease 10/02/2014  . Carotid artery narrowing 07/09/2014  . Benign essential HTN 07/01/2014  . Osteoarthritis of thumb 09/24/2012  . Mixed hyperlipidemia 09/22/2012  . Arthritis of hand, degenerative 09/22/2012    Past Surgical History:  Procedure Laterality Date  . ABDOMINAL HYSTERECTOMY     total  . APPENDECTOMY    . BREAST BIOPSY Right    neg  . BREAST CYST ASPIRATION Left    neg  . BUNIONECTOMY Bilateral   . CESAREAN SECTION    . COLONOSCOPY  2010   normal  . COLONOSCOPY WITH PROPOFOL N/A 04/03/2018   Procedure: COLONOSCOPY WITH BIOPSIES;  Surgeon: Lucilla Lame, MD;  Location: [place];  Service: Endoscopy;  Laterality: N/A;  sleep apnea  . CORONARY ANGIOPLASTY WITH STENT PLACEMENT  04/2006  . dental implant     [age]  . epidural steroid injection  2018  . SHOULDER ARTHROSCOPY Right 05/07/2015   Procedure: Extensive arthroscopic debridement and arthroscopic subacromial decompression, right shoulder.  2. Steroid injection right thumb CMC joint.;  Surgeon:  Corky Mull, MD;  Location: [place];  Service: Orthopedics;  Laterality: Right;  . TOOTH EXTRACTION     Upper centrals and lateral    Prior to Admission medications   Medication Sig Start Date End Date Taking? Authorizing Provider  aspirin EC 81 MG tablet Take 81 mg by mouth daily.    [provider]  citalopram (CELEXA) 20 MG tablet TAKE 1 TABLET BY MOUTH   DAILY 10/12/18   [name], MD  cyclobenzaprine (FLEXERIL) 10 MG tablet TAKE 1 TABLET BY MOUTH 3  TIMES DAILY 12/02/17   [name], MD  diazepam (VALIUM) 5 MG tablet as needed. 04/08/19   [provider]  Diclofenac-miSOPROStol 75-0.2 MG TBEC TAKE 1 TABLET BY MOUTH TWO  TIMES DAILY 11/23/18   [name], MD  DULoxetine (CYMBALTA) 60 MG capsule TAKE 1 CAPSULE(60 MG) BY MOUTH DAILY 03/05/19   [name], MD  esomeprazole (NEXIUM) 20 MG capsule Take 40 mg by mouth daily. Over the Counter    [provider]  HYDROcodone-acetaminophen (NORCO/VICODIN) 5-325 MG tablet Take 1 tablet by mouth at bedtime. Limit 1-2 tablets by mouth per day if tolerated 01/11/19 02/10/19  Molli Barrows, MD  HYDROcodone-acetaminophen (NORCO/VICODIN) 5-325 MG tablet Take 1 tablet by mouth daily. 02/20/19 03/22/19  Molli Barrows, MD  Ibuprofen-diphenhydrAMINE Cit (MOTRIN PM PO) Advil PM  Take 2 tabs po qhs    [provider]  levothyroxine (SYNTHROID) 88 MCG tablet TAKE 1 TABLET BY MOUTH  DAILY 03/25/19   [name], MD  Loperamide HCl (IMODIUM PO) Take by mouth as needed.    [provider]  losartan (COZAAR) 50 MG tablet Take 50 mg by mouth daily.    [provider]  Multiple Vitamin (MULTI-VITAMINS) TABS Take by mouth.    [provider]  NON FORMULARY     [provider]  ondansetron (ZOFRAN ODT) 4 MG disintegrating tablet Take 1 tablet (4 mg total) by mouth every 8 (eight) hours as needed for nausea or vomiting. 04/19/19   [name], MD  pravastatin (PRAVACHOL) 80 MG tablet Take 80 mg by mouth daily.    [provider]  tolterodine (DETROL LA) 4 MG 24 hr capsule TAKE 1 CAPSULE(4 MG) BY MOUTH DAILY 05/12/18   Gae Dry, MD  traMADol (ULTRAM) 50 MG tablet daily as needed. 04/08/19   [provider]    Allergies Augmentin [amoxicillin-pot clavulanate], Codeine, Sulfa antibiotics, Proparacaine, and  Shellfish allergy  Family History  Problem Relation Age of Onset  . Stroke Mother   . Heart disease Father   . Pancreatic cancer Sister   . Diabetes Sister   . Prostate cancer Brother   . Breast cancer Neg Hx   . Kidney cancer Neg Hx   . Bladder Cancer Neg Hx     Social History Social History   Tobacco Use  . Smoking status: Former Smoker    Packs/day: 1.25    Years: 44.00    Pack years: 55.00    Types: Cigarettes    Quit date: 04/22/2006    Years since quitting: 13.0  . Smokeless tobacco: Never Used  . Tobacco comment:    Substance Use Topics  . Alcohol use: No    Alcohol/week: 0.0 standard drinks  . Drug use: No    Review of Systems Constitutional: Positive for fever/chills.  Positive generalized weakness Eyes: No visual changes. ENT: No sore throat. Cardiovascular: Denies chest pain.  Respiratory: Positive for cough Gastrointestinal: No abdominal pain.  No nausea, no vomiting.  No diarrhea.  No constipation. Genitourinary: Negative for dysuria. Musculoskeletal: Negative for neck pain.  Negative for back pain. Integumentary: Negative for rash. Neurological: Negative for headaches, focal weakness or numbness.   ____________________________________________   PHYSICAL EXAM:  VITAL SIGNS: ED Triage Vitals [04/19/19 2148]  Enc Vitals Group     BP 139/69     Pulse Rate (!) 58     Resp 20     Temp 100.1 F (37.8 C)     Temp Source Oral     SpO2 97 %     Weight      Height      Head Circumference      Peak Flow      Pain Score      Pain Loc      Pain Edu?      Excl. in [place]?     Constitutional: Alert and oriented.  Eyes: Conjunctivae are normal.  Mouth/Throat: Patient is wearing a mask. Neck: No stridor.  No meningeal signs.   Cardiovascular: Normal rate, regular rhythm. Good peripheral circulation. Grossly normal heart sounds. Respiratory: Normal respiratory effort.  No retractions. Gastrointestinal: Soft and nontender. No distention.    Musculoskeletal: No lower extremity tenderness nor edema. No gross deformities of extremities. Neurologic:  Normal speech and language. No gross focal neurologic deficits are appreciated.  Skin:  Skin is warm, dry and intact. Psychiatric: Mood and affect are normal. Speech and behavior are normal.  ____________________________________________   LABS (all labs ordered are listed, but only abnormal results are displayed)  Labs Reviewed  BASIC METABOLIC PANEL - Abnormal; Notable for the following components:      Result Value   Sodium 134 (*)    Glucose, Bld 143 (*)    BUN 24 (*)    Creatinine, Ser 1.20 (*)    Calcium 8.7 (*)    GFR calc non Af Amer 45 (*)    GFR calc Af Amer 53 (*)    All other components within normal limits  URINALYSIS, COMPLETE (UACMP) WITH MICROSCOPIC - Abnormal; Notable for the following components:   Color, Urine YELLOW (*)    APPearance HAZY (*)    Specific Gravity, Urine 1.004 (*)    Hgb urine dipstick SMALL (*)    Leukocytes,Ua LARGE (*)    WBC, UA >50 (*)    Bacteria, UA MANY (*)    All other components within normal limits  RESPIRATORY PANEL BY RT PCR (FLU A&B, COVID)  CBC  POC SARS CORONAVIRUS 2 AG -  ED  POC SARS CORONAVIRUS 2 AG  CBG MONITORING, ED  TROPONIN I (HIGH SENSITIVITY)   ____________________________________________  EKG  ED ECG REPORT I, [place] N Christepher Melchior, the attending physician, personally viewed and interpreted this ECG.   Date: 04/19/2019  EKG Time: 9:48 PM  Rate: 105  Rhythm: Sinus tachycardia  Axis: Normal  Intervals: Normal  ST&T Change: None   ____________________________________________  RADIOLOGY I, Tyr[place] N Travarus Trudo, personally viewed and evaluated these images (plain radiographs) as part of my medical decision making, as well as reviewing the written report by the radiologist.  ED MD interpretation: Shallow lung inflation without focal airspace disease on chest x-ray interpretation per  radiologist.  Official radiology report(s): DG Chest Port 1 View  Result Date: 04/20/2019 CLINICAL DATA:  Cough and weakness EXAM: PORTABLE CHEST 1 VIEW COMPARISON:  12/11/2017 FINDINGS: Shallow lung inflation. No focal airspace consolidation or  pulmonary edema. No pleural effusion or pneumothorax. Normal cardiomediastinal contours. IMPRESSION: Shallow lung inflation without focal airspace disease. Electronically Signed   By: Ulyses Jarred M.D.   On: 04/20/2019 00:23    Procedures   ____________________________________________   INITIAL IMPRESSION / MDM / ASSESSMENT AND PLAN / ED COURSE  As part of my medical decision making, I reviewed the following data within the electronic MEDICAL RECORD NUMBER 74 year old female presented with above-stated history and physical exam with differential diagnosis including but not limited to pneumonia bronchitis COVID-19 urinary tract infection dehydration.  Clinical exam and laboratory data consistent with dehydration and as such patient given 2 L IV normal saline.  Chest x-ray revealed no evidence of pneumonia.  COVID-19 testing negative.  Urinalysis consistent with a urinary tract infection as such patient will be given Keflex.  Patient advised to continue oral hydration today at home with Gatorade and water.  ____________________________________________  FINAL CLINICAL IMPRESSION(S) / ED DIAGNOSES  Final diagnoses:  Acute cystitis without hematuria  Dehydration  Upper respiratory tract infection, unspecified type     MEDICATIONS GIVEN DURING THIS VISIT:  Medications  acetaminophen (TYLENOL) tablet 1,000 mg (1,000 mg Oral Given 04/19/19 2154)  sodium chloride 0.9 % bolus 1,000 mL (0 mLs Intravenous Stopped 04/20/19 0132)  sodium chloride 0.9 % bolus 1,000 mL (1,000 mLs Intravenous New Bag/Given 04/20/19 0132)     ED Discharge Orders    None      *Please note:  Carrie Myers was evaluated in Emergency Department on 04/20/2019 for the symptoms  described in the history of present illness. She was evaluated in the context of the global COVID-19 pandemic, which necessitated consideration that the patient might be at risk for infection with the SARS-CoV-2 virus that causes COVID-19. Institutional protocols and algorithms that pertain to the evaluation of patients at risk for COVID-19 are in a state of rapid change based on information released by regulatory bodies including the CDC and federal and state organizations. These policies and algorithms were followed during the patient's care in the ED.  Some ED evaluations and interventions may be delayed as a result of limited staffing during the pandemic.*  Note:  This document was prepared using Dragon voice recognition software and may include unintentional dictation errors.   Gregor Hams, MD 04/20/19 [phone]

## 2019-04-23 ENCOUNTER — Ambulatory Visit: Payer: Medicare Other

## 2019-04-30 ENCOUNTER — Other Ambulatory Visit: Payer: Self-pay

## 2019-04-30 ENCOUNTER — Ambulatory Visit: Payer: Medicare Other | Attending: Anesthesiology | Admitting: Anesthesiology

## 2019-04-30 ENCOUNTER — Encounter: Payer: Self-pay | Admitting: Anesthesiology

## 2019-04-30 DIAGNOSIS — M5442 Lumbago with sciatica, left side: Secondary | ICD-10-CM | POA: Diagnosis not present

## 2019-04-30 DIAGNOSIS — M5441 Lumbago with sciatica, right side: Secondary | ICD-10-CM

## 2019-04-30 DIAGNOSIS — M5136 Other intervertebral disc degeneration, lumbar region: Secondary | ICD-10-CM | POA: Diagnosis not present

## 2019-04-30 DIAGNOSIS — G894 Chronic pain syndrome: Secondary | ICD-10-CM

## 2019-04-30 DIAGNOSIS — M797 Fibromyalgia: Secondary | ICD-10-CM | POA: Diagnosis not present

## 2019-04-30 DIAGNOSIS — G8929 Other chronic pain: Secondary | ICD-10-CM

## 2019-04-30 DIAGNOSIS — M159 Polyosteoarthritis, unspecified: Secondary | ICD-10-CM

## 2019-04-30 DIAGNOSIS — M47812 Spondylosis without myelopathy or radiculopathy, cervical region: Secondary | ICD-10-CM

## 2019-04-30 DIAGNOSIS — F1190 Opioid use, unspecified, uncomplicated: Secondary | ICD-10-CM

## 2019-04-30 MED ORDER — HYDROCODONE-ACETAMINOPHEN 5-325 MG PO TABS: 1.0000 | ORAL_TABLET | Freq: Every day | ORAL | 0 refills | Status: DC

## 2019-04-30 NOTE — Progress Notes (Signed)
Virtual Visit via Telephone Note  I connected with Carrie Myers on 04/30/19 at  1:45 PM EST by telephone and verified that I am speaking with the correct person using two identifiers.  Location: Patient: Home Provider: Pain control center   I discussed the limitations, risks, security and privacy concerns of performing an evaluation and management service by telephone and the availability of in person appointments. I also discussed with the patient that there may be a patient responsible charge related to this service. The patient expressed understanding and agreed to proceed.   History of Present Illness: I spoke with [name] today via telephone for her virtual conference.  She was not able do the video portion of this.  She states that her low back pain is been doing reasonably well following her 2 epidurals back in the end of 2020.  She still gets some pain but the sciatica symptoms are less intense but have shown some gradual recurrence in both calves.  However she is pleased with her progress.  Otherwise she is in her usual state of health with no new changes in lower extremity strength or function.  She occasionally gets some neck pain consistent with what she has had in the past but this is more intermittent in nature.  The back pain is well remedied by the Vicodin that she takes on a daily evening basis and occasional tramadol during the days for pain relief.  She is using Flexeril as well.  Otherwise no other changes are noted at this time.  She does report that she has had some gradual recurrence of sciatica-like symptoms and is interested in returning to clinic in 2 months for repeat epidural which is consistent with her past history.    Observations/Objective:   Current Outpatient Medications:  .  aspirin EC 81 MG tablet, Take 81 mg by mouth daily., Disp: , Rfl:  .  citalopram (CELEXA) 20 MG tablet, TAKE 1 TABLET BY MOUTH  DAILY, Disp: 90 tablet, Rfl: 3 .  cyclobenzaprine  (FLEXERIL) 10 MG tablet, TAKE 1 TABLET BY MOUTH 3  TIMES DAILY, Disp: 270 tablet, Rfl: 0 .  diazepam (VALIUM) 5 MG tablet, as needed., Disp: , Rfl:  .  Diclofenac-miSOPROStol 75-0.2 MG TBEC, TAKE 1 TABLET BY MOUTH TWO  TIMES DAILY, Disp: 180 tablet, Rfl: 3 .  DULoxetine (CYMBALTA) 60 MG capsule, TAKE 1 CAPSULE(60 MG) BY MOUTH DAILY, Disp: 30 capsule, Rfl: 5 .  esomeprazole (NEXIUM) 20 MG capsule, Take 40 mg by mouth daily. Over the Counter, Disp: , Rfl:  .  HYDROcodone-acetaminophen (NORCO/VICODIN) 5-325 MG tablet, Take 1 tablet by mouth at bedtime. Limit 1-2 tablets by mouth per day if tolerated, Disp: 30 tablet, Rfl: 0 .  [START ON 05/29/2019] HYDROcodone-acetaminophen (NORCO/VICODIN) 5-325 MG tablet, Take 1 tablet by mouth daily., Disp: 30 tablet, Rfl: 0 .  Ibuprofen-diphenhydrAMINE Cit (MOTRIN PM PO), Advil PM  Take 2 tabs po qhs, Disp: , Rfl:  .  levothyroxine (SYNTHROID) 88 MCG tablet, TAKE 1 TABLET BY MOUTH  DAILY, Disp: 90 tablet, Rfl: 3 .  Loperamide HCl (IMODIUM PO), Take by mouth as needed., Disp: , Rfl:  .  losartan (COZAAR) 50 MG tablet, Take 50 mg by mouth daily., Disp: , Rfl:  .  Multiple Vitamin (MULTI-VITAMINS) TABS, Take by mouth., Disp: , Rfl:  .  NON FORMULARY, , Disp: , Rfl:  .  ondansetron (ZOFRAN ODT) 4 MG disintegrating tablet, Take 1 tablet (4 mg total) by mouth every 8 (eight) hours  as needed for nausea or vomiting., Disp: 30 tablet, Rfl: 0 .  pravastatin (PRAVACHOL) 80 MG tablet, Take 80 mg by mouth daily., Disp: , Rfl:  .  tolterodine (DETROL LA) 4 MG 24 hr capsule, TAKE 1 CAPSULE(4 MG) BY MOUTH DAILY, Disp: 30 capsule, Rfl: 12 .  traMADol (ULTRAM) 50 MG tablet, daily as needed., Disp: , Rfl:  Assessment and Plan: 1. Chronic bilateral low back pain with bilateral sciatica   2. DDD (degenerative disc disease), lumbar   3. Chronic pain syndrome   4. Fibromyalgia   5. Chronic, continuous use of opioids   6. Cervical facet joint syndrome   7. Generalized osteoarthritis    Based on our discussion today and upon review of the [place] practitioner database information going to refill her medications for the next 2 months.  This will be for Vicodin for today's date in 1 month from today with return to clinic in 2 months for an epidural as scheduled at that time.  I want her to continue with efforts at weight loss core stretching strengthening exercises in the meantime.  She is instructed to contact the pain control center should she have any problems with her pain management and to continue follow-up with her primary care physicians for her baseline medical care.  Follow Up Instructions:    I discussed the assessment and treatment plan with the patient. The patient was provided an opportunity to ask questions and all were answered. The patient agreed with the plan and demonstrated an understanding of the instructions.   The patient was advised to call back or seek an in-person evaluation if the symptoms worsen or if the condition fails to improve as anticipated.  I provided 30 minutes of non-face-to-face time during this encounter.   Molli Barrows, MD

## 2019-05-07 ENCOUNTER — Ambulatory Visit (INDEPENDENT_AMBULATORY_CARE_PROVIDER_SITE_OTHER): Payer: Medicare Other

## 2019-05-07 VITALS — Ht 68.0 in | Wt 190.0 lb

## 2019-05-07 DIAGNOSIS — Z0000 Encounter for general adult medical examination without abnormal findings: Secondary | ICD-10-CM

## 2019-05-07 NOTE — Progress Notes (Signed)
Subjective:   Carrie Myers is a 74 y.o. female who presents for Medicare Annual (Subsequent) preventive examination.  Virtual Visit via Telephone Note  I connected with [name] on 05/07/19 at  2:00 PM EST by telephone and verified that I am speaking with the correct person using two identifiers.  Medicare Annual Wellness visit completed telephonically due to Covid-19 pandemic.   Location: Patient: home Provider: office   I discussed the limitations, risks, security and privacy concerns of performing an evaluation and management service by telephone and the availability of in person appointments. The patient expressed understanding and agreed to proceed.  Some vital signs may be absent or patient reported.   Clemetine Marker, LPN    Review of Systems:   Cardiac Risk Factors include: advanced age (>41men, >65 women);hypertension;dyslipidemia;obesity (BMI >30kg/m2)     Objective:     Vitals: Ht 5\' 8"  (1.727 m)   Wt 190 lb (86.2 kg)   BMI 28.89 kg/m   Body mass index is 28.89 kg/m.  Advanced Directives 05/07/2019 02/20/2019 01/01/2019 04/17/2018 04/03/2018 12/11/2017 12/06/2017  Does Patient Have a Medical Advance Directive? Yes Yes Yes Yes Yes Yes Yes  Type of Paramedic of [place];Living will - [place];Living will [place];Living will [place];Living will Living will;Healthcare Power of [place];Living will  Does patient want to make changes to medical advance directive? - - - - - - No - Patient declined  Copy of [place] in Chart? No - copy requested - - Yes - validated most recent copy scanned in chart (See row information) Yes - validated most recent copy scanned in chart (See row information) No - copy requested No - copy requested    Tobacco Social History   Tobacco Use  Smoking Status Former Smoker  . Packs/day: 1.25  .  Years: 44.00  . Pack years: 55.00  . Types: Cigarettes  . Quit date: 04/22/2006  . Years since quitting: 13.0  Smokeless Tobacco Never Used  Tobacco Comment         Counseling given: Not Answered Comment:     Clinical Intake:  Pre-visit preparation completed: Yes  Pain : No/denies pain     BMI - recorded: 28.89 Nutritional Status: BMI 25 -29 Overweight Nutritional Risks: None Diabetes: No  How often do you need to have someone help you when you read instructions, pamphlets, or other written materials from your doctor or pharmacy?: 1 - Never  Interpreter Needed?: No  Information entered by :: Clemetine Marker LPN  Past Medical History:  Diagnosis Date  . Allergy   . Anxiety   . Arthritis    neck, hands, lower back  . Bilateral dry eyes   . Chronic low back pain 12/23/2016  . DDD (degenerative disc disease), lumbar 10/02/2014  . Dental crowns present    Implants, front "flipper" while waiting for other implants  . Fibromyalgia   . Fibromyalgia   . Fracture of right foot 01/14/2015  . GERD (gastroesophageal reflux disease)   . Hyperlipidemia   . Hypertension   . Kidney stones   . Myocardial infarction ([place]) 04/22/2006  . Osteoarthritis   . Other shoulder lesions, right shoulder 04/17/2015  . Rheumatoid arthritis ([place])   . Sciatica, left side 01/01/2019  . Sleep apnea    CPAP  . Status post shoulder surgery 05/27/2015  . Thyroid disease   . Vertigo    none  for approx 9 yrs   Past Surgical History:  Procedure Laterality Date  . ABDOMINAL HYSTERECTOMY     total  . APPENDECTOMY    . BREAST BIOPSY Right    neg  . BREAST CYST ASPIRATION Left    neg  . BUNIONECTOMY Bilateral   . CESAREAN SECTION    . COLONOSCOPY  2010   normal  . COLONOSCOPY WITH PROPOFOL N/A 04/03/2018   Procedure: COLONOSCOPY WITH BIOPSIES;  Surgeon: Lucilla Lame, MD;  Location: [facility];  Service: Endoscopy;  Laterality: N/A;  sleep apnea  . CORONARY ANGIOPLASTY WITH STENT PLACEMENT   04/2006  . dental implant     [age]  . epidural steroid injection  2018  . SHOULDER ARTHROSCOPY Right 05/07/2015   Procedure: Extensive arthroscopic debridement and arthroscopic subacromial decompression, right shoulder.  2. Steroid injection right thumb CMC joint.;  Surgeon: Corky Mull, MD;  Location: [facility];  Service: Orthopedics;  Laterality: Right;  . TOOTH EXTRACTION     Upper centrals and lateral   Family History  Problem Relation Age of Onset  . Stroke Mother   . Heart disease Father   . Pancreatic cancer Sister   . Diabetes Sister   . Prostate cancer Brother   . Breast cancer Neg Hx   . Kidney cancer Neg Hx   . Bladder Cancer Neg Hx    Social History   Socioeconomic History  . Marital status: Widowed    Spouse name: Not on file  . Number of children: 1  . Years of education: Not on file  . Highest education level: High school graduate  Occupational History  . Occupation: retired   Tobacco Use  . Smoking status: Former Smoker    Packs/day: 1.25    Years: 44.00    Pack years: 55.00    Types: Cigarettes    Quit date: 04/22/2006    Years since quitting: 13.0  . Smokeless tobacco: Never Used  . Tobacco comment:    Substance and Sexual Activity  . Alcohol use: No    Alcohol/week: 0.0 standard drinks  . Drug use: No  . Sexual activity: Not Currently  Other Topics Concern  . Not on file  Social History Narrative  . Not on file   Social Determinants of Health   Financial Resource Strain: Low Risk   . Difficulty of Paying Living Expenses: Not hard at all  Food Insecurity: No Food Insecurity  . Worried About Charity fundraiser in the Last Year: Never true  . Ran Out of Food in the Last Year: Never true  Transportation Needs: No Transportation Needs  . Lack of Transportation (Medical): No  . Lack of Transportation (Non-Medical): No  Physical Activity: Inactive  . Days of Exercise per Week: 0 days  . Minutes of Exercise per  Session: 0 min  Stress: No Stress Concern Present  . Feeling of Stress : Not at all  Social Connections: Somewhat Isolated  . Frequency of Communication with Friends and Family: More than three times a week  . Frequency of Social Gatherings with Friends and Family: Twice a week  . Attends Religious Services: More than 4 times per year  . Active Member of Clubs or Organizations: No  . Attends Archivist Meetings: Never  . Marital Status: Widowed    Outpatient Encounter Medications as of 05/07/2019  Medication Sig  . aspirin EC 81 MG tablet Take 81 mg by mouth daily.  . citalopram (CELEXA) 20  MG tablet TAKE 1 TABLET BY MOUTH  DAILY  . cyclobenzaprine (FLEXERIL) 10 MG tablet TAKE 1 TABLET BY MOUTH 3  TIMES DAILY  . diazepam (VALIUM) 5 MG tablet as needed.  . Diclofenac-miSOPROStol 75-0.2 MG TBEC TAKE 1 TABLET BY MOUTH TWO  TIMES DAILY  . DULoxetine (CYMBALTA) 60 MG capsule TAKE 1 CAPSULE(60 MG) BY MOUTH DAILY  . esomeprazole (NEXIUM) 20 MG capsule Take 40 mg by mouth daily. Over the Counter  . HYDROcodone-acetaminophen (NORCO/VICODIN) 5-325 MG tablet Take 1 tablet by mouth at bedtime. Limit 1-2 tablets by mouth per day if tolerated  . levothyroxine (SYNTHROID) 88 MCG tablet TAKE 1 TABLET BY MOUTH  DAILY  . Loperamide HCl (IMODIUM PO) Take by mouth as needed.  Marland Kitchen losartan (COZAAR) 50 MG tablet Take 50 mg by mouth daily.  . Multiple Vitamin (MULTI-VITAMINS) TABS Take by mouth.  . pravastatin (PRAVACHOL) 80 MG tablet Take 80 mg by mouth daily.  Marland Kitchen tolterodine (DETROL LA) 4 MG 24 hr capsule TAKE 1 CAPSULE(4 MG) BY MOUTH DAILY  . traMADol (ULTRAM) 50 MG tablet daily as needed.  Derrill Memo ON 05/29/2019] HYDROcodone-acetaminophen (NORCO/VICODIN) 5-325 MG tablet Take 1 tablet by mouth daily.  . NON FORMULARY   . [DISCONTINUED] Ibuprofen-diphenhydrAMINE Cit (MOTRIN PM PO) Advil PM  Take 2 tabs po qhs  . [DISCONTINUED] ondansetron (ZOFRAN ODT) 4 MG disintegrating tablet Take 1 tablet (4 mg  total) by mouth every 8 (eight) hours as needed for nausea or vomiting.   No facility-administered encounter medications on file as of 05/07/2019.    Activities of Daily Living In your present state of health, do you have any difficulty performing the following activities: 05/07/2019 04/19/2019  Hearing? N N  Comment declines hearing aids -  Vision? N N  Difficulty concentrating or making decisions? N N  Walking or climbing stairs? N N  Dressing or bathing? N N  Doing errands, shopping? N N  Preparing Food and eating ? N -  Using the Toilet? N -  In the past six months, have you accidently leaked urine? Y -  Comment urge incontinence, wears pads for protection -  Do you have problems with loss of bowel control? N -  Managing your Medications? N -  Managing your Finances? N -  Housekeeping or managing your Housekeeping? N -  Some recent data might be hidden    Patient Care Team: [name], MD as PCP - General (Internal Medicine) Corey Skains, MD as Consulting Physician (Cardiology) Andree Elk Alvina Filbert, MD as Consulting Physician (Pain Medicine) Laneta Simmers as Physician Assistant (Urology) Ellwood Handler., MD (Gastroenterology) Ralene Bathe, MD (Dermatology)    Assessment:   This is a routine wellness examination for [place].  Exercise Activities and Dietary recommendations Current Exercise Habits: The patient does not participate in regular exercise at present, Exercise limited by: orthopedic condition(s);neurologic condition(s)  Goals    . Increase physical activity     Increase physical activity to 150 minutes per week.     . Weight (lb) < 200 lb (90.7 kg)       Fall Risk Fall Risk  05/07/2019 02/20/2019 01/01/2019 05/08/2018 04/25/2018  Falls in the past year? 0 0 0 0 0  Comment - - - - -  Number falls in past yr: 0 - - - 0  Comment - - - - -  Injury with Fall? 0 - - - 0  Risk for fall due to : No Fall Risks - - - -  Follow up Falls  prevention discussed - - - Falls evaluation completed   FALL RISK PREVENTION PERTAINING TO THE HOME:  Any stairs in or around the home? No  If so, do they handrails? No   Home free of loose throw rugs in walkways, pet beds, electrical cords, etc? Yes  Adequate lighting in your home to reduce risk of falls? Yes   ASSISTIVE DEVICES UTILIZED TO PREVENT FALLS:  Life alert? No  Use of a cane, walker or w/c? Yes  Grab bars in the bathroom? Yes  Shower chair or bench in shower? No  Elevated toilet seat or a handicapped toilet? No   DME ORDERS:  DME order needed?  No   TIMED UP AND GO:  Was the test performed? No . Telephonic visit.   Education: Fall risk prevention has been discussed.  Intervention(s) required? No   Depression Screen PHQ 2/9 Scores 05/07/2019 04/19/2019 03/28/2019 01/01/2019  PHQ - 2 Score 0 0 0 0  PHQ- 9 Score - 5 0 -  Exception Documentation - - - -     Cognitive Function     6CIT Screen 05/07/2019 04/17/2018 03/18/2017 03/16/2016  What Year? 0 points 0 points 0 points 0 points  What month? 0 points 0 points 0 points 0 points  What time? 0 points 0 points 0 points 0 points  Count back from 20 0 points 0 points 0 points 0 points  Months in reverse 0 points 0 points 0 points 0 points  Repeat phrase 0 points 0 points 2 points 0 points  Total Score 0 0 2 0    Immunization History  Administered Date(s) Administered  . Fluad Quad(high Dose 65+) 03/28/2019  . Influenza, High Dose Seasonal PF 12/31/2016, 12/05/2017  . Pneumococcal Conjugate-13 01/30/2014  . Pneumococcal Polysaccharide-23 09/22/2012  . Tdap 09/08/2011    Qualifies for Shingles Vaccine? Yes . Due for Shingrix. Education has been provided regarding the importance of this vaccine. Pt has been advised to call insurance company to determine out of pocket expense. Advised may also receive vaccine at local pharmacy or Health Dept. Verbalized acceptance and understanding.  Tdap: Up to date  Flu Vaccine:  Up to date  Pneumococcal Vaccine: Up to date   Screening Tests Health Maintenance  Topic Date Due  . MAMMOGRAM  05/11/2019  . TETANUS/TDAP  09/07/2021  . COLONOSCOPY  04/03/2028  . INFLUENZA VACCINE  Completed  . DEXA SCAN  Completed  . Hepatitis C Screening  Completed  . PNA vac Low Risk Adult  Completed    Cancer Screenings:  Colorectal Screening: Completed 04/03/18. Repeat every 10 years;  Mammogram: Completed 05/10/18. Repeat every year; scheduled for 05/14/19.   Bone Density: Completed 05/10/18. Results reflect  OSTEOPENIA. Repeat every 2 years.  Lung Cancer Screening: (Low Dose CT Chest recommended if Age 66-80 years, 30 pack-year currently smoking OR have quit w/in 15years.) does not qualify.    Additional Screening:  Hepatitis C Screening: does qualify; Completed 03/16/16  Vision Screening: Recommended annual ophthalmology exams for early detection of glaucoma and other disorders of the eye. Is the patient up to date with their annual eye exam?  Yes  Who is the provider or what is the name of the office in which the pt attends annual eye exams? [facility]  Dental Screening: Recommended annual dental exams for proper oral hygiene  Community Resource Referral:  CRR required this visit?  No        Plan:     I  have personally reviewed and addressed the Medicare Annual Wellness questionnaire and have noted the following in the patient's chart:  A. Medical and social history B. Use of alcohol, tobacco or illicit drugs  C. Current medications and supplements D. Functional ability and status E.  Nutritional status F.  Physical activity G. Advance directives H. List of other physicians I.  Hospitalizations, surgeries, and ER visits in previous 12 months J.  [place] such as hearing and vision if needed, cognitive and depression L. Referrals and appointments   In addition, I have reviewed and discussed with patient certain preventive protocols, quality  metrics, and best practice recommendations. A written personalized care plan for preventive services as well as general preventive health recommendations were provided to patient.   Signed,  Clemetine Marker, LPN Nurse Health Advisor   Nurse Notes: none

## 2019-05-07 NOTE — Patient Instructions (Signed)
[name] , Thank you for taking time to come for your Medicare Wellness Visit. I appreciate your ongoing commitment to your health goals. Please review the following plan we discussed and let me know if I can assist you in the future.   Screening recommendations/referrals: Colonoscopy: done 04/03/18 Mammogram: done 05/10/18. Scheduled for 05/14/19 Bone Density: done 05/10/18 Recommended yearly ophthalmology/optometry visit for glaucoma screening and checkup Recommended yearly dental visit for hygiene and checkup  Vaccinations: Influenza vaccine: done 03/28/19 Pneumococcal vaccine: done 01/30/14 Tdap vaccine: done 09/08/11 Shingles vaccine: Shingrix discussed. Please contact your pharmacy for coverage information.   Advanced directives: Please bring a copy of your health care power of attorney and living will to the office at your convenience.  Conditions/risks identified: Recommend increasing physical activity  Next appointment: Please follow up in one year for your Medicare Annual Wellness visit.     Preventive Care 20 Years and Older, Female Preventive care refers to lifestyle choices and visits with your health care provider that can promote health and wellness. What does preventive care include?  A yearly physical exam. This is also called an annual well check.  Dental exams once or twice a year.  Routine eye exams. Ask your health care provider how often you should have your eyes checked.  Personal lifestyle choices, including:  Daily care of your teeth and gums.  Regular physical activity.  Eating a healthy diet.  Avoiding tobacco and drug use.  Limiting alcohol use.  Practicing safe sex.  Taking low-dose aspirin every day.  Taking vitamin and mineral supplements as recommended by your health care provider. What happens during an annual well check? The services and screenings done by your health care provider during your annual well check will depend on your  age, overall health, lifestyle risk factors, and family history of disease. Counseling  Your health care provider may ask you questions about your:  Alcohol use.  Tobacco use.  Drug use.  Emotional well-being.  Home and relationship well-being.  Sexual activity.  Eating habits.  History of falls.  Memory and ability to understand (cognition).  Work and work Statistician.  Reproductive health. Screening  You may have the following tests or measurements:  Height, weight, and BMI.  Blood pressure.  Lipid and cholesterol levels. These may be checked every 5 years, or more frequently if you are over 6 years old.  Skin check.  Lung cancer screening. You may have this screening every year starting at age 63 if you have a 30-pack-year history of smoking and currently smoke or have quit within the past 15 years.  Fecal occult blood test (FOBT) of the stool. You may have this test every year starting at age 13.  Flexible sigmoidoscopy or colonoscopy. You may have a sigmoidoscopy every 5 years or a colonoscopy every 10 years starting at age 40.  Hepatitis C blood test.  Hepatitis B blood test.  Sexually transmitted disease (STD) testing.  Diabetes screening. This is done by checking your blood sugar (glucose) after you have not eaten for a while (fasting). You may have this done every 1-3 years.  Bone density scan. This is done to screen for osteoporosis. You may have this done starting at age 73.  Mammogram. This may be done every 1-2 years. Talk to your health care provider about how often you should have regular mammograms. Talk with your health care provider about your test results, treatment options, and if necessary, the need for more tests. Vaccines  Your health  care provider may recommend certain vaccines, such as:  Influenza vaccine. This is recommended every year.  Tetanus, diphtheria, and acellular pertussis (Tdap, Td) vaccine. You may need a Td booster  every 10 years.  Zoster vaccine. You may need this after age 73.  Pneumococcal 13-valent conjugate (PCV13) vaccine. One dose is recommended after age 75.  Pneumococcal polysaccharide (PPSV23) vaccine. One dose is recommended after age 82. Talk to your health care provider about which screenings and vaccines you need and how often you need them. This information is not intended to replace advice given to you by your health care provider. Make sure you discuss any questions you have with your health care provider. Document Released: 03/28/2015 Document Revised: 11/19/2015 Document Reviewed: 12/31/2014 Elsevier Interactive Patient Education  2017 [place] Prevention in the Home Falls can cause injuries. They can happen to people of all ages. There are many things you can do to make your home safe and to help prevent falls. What can I do on the outside of my home?  Regularly fix the edges of walkways and driveways and fix any cracks.  Remove anything that might make you trip as you walk through a door, such as a raised step or threshold.  Trim any bushes or trees on the path to your home.  Use bright outdoor lighting.  Clear any walking paths of anything that might make someone trip, such as rocks or tools.  Regularly check to see if handrails are loose or broken. Make sure that both sides of any steps have handrails.  Any raised decks and porches should have guardrails on the edges.  Have any leaves, snow, or ice cleared regularly.  Use sand or salt on walking paths during winter.  Clean up any spills in your garage right away. This includes oil or grease spills. What can I do in the bathroom?  Use night lights.  Install grab bars by the toilet and in the tub and shower. Do not use towel bars as grab bars.  Use non-skid mats or decals in the tub or shower.  If you need to sit down in the shower, use a plastic, non-slip stool.  Keep the floor dry. Clean up any  water that spills on the floor as soon as it happens.  Remove soap buildup in the tub or shower regularly.  Attach bath mats securely with double-sided non-slip rug tape.  Do not have throw rugs and other things on the floor that can make you trip. What can I do in the bedroom?  Use night lights.  Make sure that you have a light by your bed that is easy to reach.  Do not use any sheets or blankets that are too big for your bed. They should not hang down onto the floor.  Have a firm chair that has side arms. You can use this for support while you get dressed.  Do not have throw rugs and other things on the floor that can make you trip. What can I do in the kitchen?  Clean up any spills right away.  Avoid walking on wet floors.  Keep items that you use a lot in easy-to-reach places.  If you need to reach something above you, use a strong step stool that has a grab bar.  Keep electrical cords out of the way.  Do not use floor polish or wax that makes floors slippery. If you must use wax, use non-skid floor wax.  Do not have  throw rugs and other things on the floor that can make you trip. What can I do with my stairs?  Do not leave any items on the stairs.  Make sure that there are handrails on both sides of the stairs and use them. Fix handrails that are broken or loose. Make sure that handrails are as long as the stairways.  Check any carpeting to make sure that it is firmly attached to the stairs. Fix any carpet that is loose or worn.  Avoid having throw rugs at the top or bottom of the stairs. If you do have throw rugs, attach them to the floor with carpet tape.  Make sure that you have a light switch at the top of the stairs and the bottom of the stairs. If you do not have them, ask someone to add them for you. What else can I do to help prevent falls?  Wear shoes that:  Do not have high heels.  Have rubber bottoms.  Are comfortable and fit you well.  Are closed  at the toe. Do not wear sandals.  If you use a stepladder:  Make sure that it is fully opened. Do not climb a closed stepladder.  Make sure that both sides of the stepladder are locked into place.  Ask someone to hold it for you, if possible.  Clearly mark and make sure that you can see:  Any grab bars or handrails.  First and last steps.  Where the edge of each step is.  Use tools that help you move around (mobility aids) if they are needed. These include:  Canes.  Walkers.  Scooters.  Crutches.  Turn on the lights when you go into a dark area. Replace any light bulbs as soon as they burn out.  Set up your furniture so you have a clear path. Avoid moving your furniture around.  If any of your floors are uneven, fix them.  If there are any pets around you, be aware of where they are.  Review your medicines with your doctor. Some medicines can make you feel dizzy. This can increase your chance of falling. Ask your doctor what other things that you can do to help prevent falls. This information is not intended to replace advice given to you by your health care provider. Make sure you discuss any questions you have with your health care provider. Document Released: 12/26/2008 Document Revised: 08/07/2015 Document Reviewed: 04/05/2014 Elsevier Interactive Patient Education  2017 [employer].

## 2019-05-09 ENCOUNTER — Telehealth: Payer: Self-pay | Admitting: Internal Medicine

## 2019-05-09 ENCOUNTER — Other Ambulatory Visit: Payer: Self-pay

## 2019-05-09 ENCOUNTER — Ambulatory Visit
Admission: EM | Admit: 2019-05-09 | Discharge: 2019-05-09 | Disposition: A | Discharge status: Home / Self Care | Payer: Medicare Other | Attending: Family Medicine | Admitting: Family Medicine

## 2019-05-09 ENCOUNTER — Encounter: Payer: Self-pay | Admitting: Emergency Medicine

## 2019-05-09 ENCOUNTER — Ambulatory Visit: Payer: Medicare Other

## 2019-05-09 DIAGNOSIS — R531 Weakness: Secondary | ICD-10-CM

## 2019-05-09 DIAGNOSIS — R5383 Other fatigue: Secondary | ICD-10-CM

## 2019-05-09 DIAGNOSIS — N390 Urinary tract infection, site not specified: Secondary | ICD-10-CM

## 2019-05-09 DIAGNOSIS — Z87891 Personal history of nicotine dependence: Secondary | ICD-10-CM | POA: Insufficient documentation

## 2019-05-09 DIAGNOSIS — Z833 Family history of diabetes mellitus: Secondary | ICD-10-CM | POA: Insufficient documentation

## 2019-05-09 DIAGNOSIS — Z91013 Allergy to seafood: Secondary | ICD-10-CM | POA: Insufficient documentation

## 2019-05-09 DIAGNOSIS — I10 Essential (primary) hypertension: Secondary | ICD-10-CM | POA: Insufficient documentation

## 2019-05-09 DIAGNOSIS — G4733 Obstructive sleep apnea (adult) (pediatric): Secondary | ICD-10-CM | POA: Diagnosis not present

## 2019-05-09 DIAGNOSIS — F419 Anxiety disorder, unspecified: Secondary | ICD-10-CM | POA: Diagnosis not present

## 2019-05-09 DIAGNOSIS — Z885 Allergy status to narcotic agent status: Secondary | ICD-10-CM | POA: Insufficient documentation

## 2019-05-09 DIAGNOSIS — Z8249 Family history of ischemic heart disease and other diseases of the circulatory system: Secondary | ICD-10-CM | POA: Diagnosis not present

## 2019-05-09 DIAGNOSIS — Z955 Presence of coronary angioplasty implant and graft: Secondary | ICD-10-CM | POA: Insufficient documentation

## 2019-05-09 DIAGNOSIS — R350 Frequency of micturition: Secondary | ICD-10-CM | POA: Diagnosis present

## 2019-05-09 DIAGNOSIS — Z20822 Contact with and (suspected) exposure to covid-19: Secondary | ICD-10-CM | POA: Insufficient documentation

## 2019-05-09 DIAGNOSIS — Z880 Allergy status to penicillin: Secondary | ICD-10-CM | POA: Insufficient documentation

## 2019-05-09 DIAGNOSIS — Z7982 Long term (current) use of aspirin: Secondary | ICD-10-CM | POA: Insufficient documentation

## 2019-05-09 DIAGNOSIS — Z823 Family history of stroke: Secondary | ICD-10-CM | POA: Insufficient documentation

## 2019-05-09 DIAGNOSIS — M797 Fibromyalgia: Secondary | ICD-10-CM | POA: Insufficient documentation

## 2019-05-09 DIAGNOSIS — R509 Fever, unspecified: Secondary | ICD-10-CM

## 2019-05-09 DIAGNOSIS — I2510 Atherosclerotic heart disease of native coronary artery without angina pectoris: Secondary | ICD-10-CM | POA: Diagnosis not present

## 2019-05-09 DIAGNOSIS — E890 Postprocedural hypothyroidism: Secondary | ICD-10-CM | POA: Insufficient documentation

## 2019-05-09 DIAGNOSIS — R300 Dysuria: Secondary | ICD-10-CM

## 2019-05-09 DIAGNOSIS — Z884 Allergy status to anesthetic agent status: Secondary | ICD-10-CM | POA: Insufficient documentation

## 2019-05-09 DIAGNOSIS — Z79890 Hormone replacement therapy (postmenopausal): Secondary | ICD-10-CM | POA: Insufficient documentation

## 2019-05-09 DIAGNOSIS — Z79899 Other long term (current) drug therapy: Secondary | ICD-10-CM | POA: Diagnosis not present

## 2019-05-09 DIAGNOSIS — K219 Gastro-esophageal reflux disease without esophagitis: Secondary | ICD-10-CM | POA: Insufficient documentation

## 2019-05-09 DIAGNOSIS — Z881 Allergy status to other antibiotic agents status: Secondary | ICD-10-CM | POA: Insufficient documentation

## 2019-05-09 DIAGNOSIS — Z8042 Family history of malignant neoplasm of prostate: Secondary | ICD-10-CM | POA: Diagnosis not present

## 2019-05-09 DIAGNOSIS — G894 Chronic pain syndrome: Secondary | ICD-10-CM | POA: Insufficient documentation

## 2019-05-09 DIAGNOSIS — E782 Mixed hyperlipidemia: Secondary | ICD-10-CM | POA: Insufficient documentation

## 2019-05-09 DIAGNOSIS — Z882 Allergy status to sulfonamides status: Secondary | ICD-10-CM | POA: Diagnosis not present

## 2019-05-09 LAB — BASIC METABOLIC PANEL
Anion gap: 13 (ref 5–15)
BUN: 16 mg/dL (ref 8–23)
CO2: 20 mmol/L — ABNORMAL LOW (ref 22–32)
Calcium: 8.4 mg/dL — ABNORMAL LOW (ref 8.9–10.3)
Chloride: 100 mmol/L (ref 98–111)
Creatinine, Ser: 0.98 mg/dL (ref 0.44–1.00)
GFR calc Af Amer: >60 mL/min (ref >60)
GFR calc non Af Amer: 58 mL/min — ABNORMAL LOW (ref >60)
Glucose, Bld: 117 mg/dL — ABNORMAL HIGH (ref 70–99)
Potassium: 3.9 mmol/L (ref 3.5–5.1)
Sodium: 133 mmol/L — ABNORMAL LOW (ref 135–145)

## 2019-05-09 LAB — CBC WITH DIFFERENTIAL/PLATELET
Abs Immature Granulocytes: 0.03 10*3/uL (ref 0.00–0.07)
Basophils Absolute: 0 10*3/uL (ref 0.0–0.1)
Basophils Relative: 0 %
Eosinophils Absolute: 0 10*3/uL (ref 0.0–0.5)
Eosinophils Relative: 0 %
HCT: 34.6 % — ABNORMAL LOW (ref 36.0–46.0)
Hemoglobin: 11.6 g/dL — ABNORMAL LOW (ref 12.0–15.0)
Immature Granulocytes: 0 %
Lymphocytes Relative: 11 %
Lymphs Abs: 1.2 10*3/uL (ref 0.7–4.0)
MCH: 30.6 pg (ref 26.0–34.0)
MCHC: 33.5 g/dL (ref 30.0–36.0)
MCV: 91.3 fL (ref 80.0–100.0)
Monocytes Absolute: 0.9 10*3/uL (ref 0.1–1.0)
Monocytes Relative: 9 %
Neutro Abs: 8 10*3/uL — ABNORMAL HIGH (ref 1.7–7.7)
Neutrophils Relative %: 80 %
Platelets: 231 10*3/uL (ref 150–400)
RBC: 3.79 MIL/uL — ABNORMAL LOW (ref 3.87–5.11)
RDW: 12.8 % (ref 11.5–15.5)
WBC: 10.2 10*3/uL (ref 4.0–10.5)
nRBC: 0 % (ref 0.0–0.2)

## 2019-05-09 LAB — URINALYSIS, COMPLETE (UACMP) WITH MICROSCOPIC
Bilirubin Urine: NEGATIVE
Glucose, UA: NEGATIVE mg/dL
Ketones, ur: NEGATIVE mg/dL
Nitrite: NEGATIVE
Protein, ur: 100 mg/dL — AB
Specific Gravity, Urine: 1.015 (ref 1.005–1.030)
WBC, UA: >50 WBC/hpf (ref 0–5)
pH: 5.5 (ref 5.0–8.0)

## 2019-05-09 LAB — SARS CORONAVIRUS 2 AG (30 MIN TAT): SARS Coronavirus 2 Ag: NEGATIVE

## 2019-05-09 MED ORDER — CIPROFLOXACIN HCL 500 MG PO TABS: 500.0000 mg | ORAL_TABLET | Freq: Two times a day (BID) | ORAL | 0 refills | Status: DC

## 2019-05-09 NOTE — Discharge Instructions (Signed)
Lots of fluids.  Antibiotic as prescribed.  Take care  Dr. Lacinda Axon

## 2019-05-09 NOTE — Telephone Encounter (Signed)
Pt having sore throat, temp of 99.9, body aches, head hurting. Wanted to go to ER but since [facility] is closer wanted to double check and she if she could be seen there. Per CMA ok to go to [facility] since they can see her in person and test for COvid

## 2019-05-09 NOTE — ED Triage Notes (Signed)
Patient in today c/o urinary frequency, dysuria, fatigue and excessive thirst. Patient states she had a UTI ~ 20 days ago and feels the same.

## 2019-05-09 NOTE — ED Provider Notes (Signed)
[facility]    CSN: [id number] Arrival date & time: 05/09/19  1218  History   Chief Complaint Chief Complaint  Patient presents with  . Urinary Frequency  . Fatigue  . Polydipsia   HPI  74 year old female presents with the above complaints.  Patient reports that she had a UTI earlier this month and was treated in the hospital.  Labs have been reviewed.  No culture was obtained.  She was treated with Keflex.  Patient reports that she improved but the dysuria never resolved.  She reports that yesterday, she felt poorly.  She reports generalized weakness and fatigue.  She reports continued dysuria and also reports urinary frequency.  It is difficult to discern whether her urinary frequency is at baseline or worse than normal as she has overactive bladder.  No reports of hematuria.  Patient reports ongoing back pain as well.  No documented true fever but she states that her temperature has been as high as 99.9.  She is able to drink.  She states that she finds her self drinking a lot and seems to be very thirsty.  She reports dry mouth and throat.  No relieving factors.  Patient is concerned that she has a urinary tract infection.  No other complaints at this time.  Past Medical History:  Diagnosis Date  . Allergy   . Anxiety   . Arthritis    neck, hands, lower back  . Bilateral dry eyes   . Chronic low back pain 12/23/2016  . DDD (degenerative disc disease), lumbar 10/02/2014  . Dental crowns present    Implants, front "flipper" while waiting for other implants  . Fibromyalgia   . Fibromyalgia   . Fracture of right foot 01/14/2015  . GERD (gastroesophageal reflux disease)   . Hyperlipidemia   . Hypertension   . Kidney stones   . Myocardial infarction ([place]) 04/22/2006  . Osteoarthritis   . Other shoulder lesions, right shoulder 04/17/2015  . Rheumatoid arthritis ([place])   . Sciatica, left side 01/01/2019  . Sleep apnea    CPAP  . Status post shoulder surgery 05/27/2015  .  Thyroid disease   . Vertigo    none for approx 9 yrs    Patient Active Problem List   Diagnosis Date Noted  . Sciatica, left side 01/01/2019  . Urinary urgency 05/12/2018  . Urinary frequency 05/12/2018  . Diverticulosis of colon 04/25/2018  . Change in bowel habits   . Tobacco use disorder, moderate, in sustained remission 12/22/2017  . Moderate episode of recurrent major depressive disorder ([place]) 10/03/2017  . Inflammatory spondylopathy of lumbar region ([place]) 10/03/2017  . Hoarseness, persistent 04/18/2017  . OSA on CPAP 02/18/2017  . Cervical disc disease 12/31/2016  . Generalized osteoarthritis 12/23/2016  . Chronic pain syndrome 12/23/2016  . Cystocele, midline 12/06/2016  . Urinary incontinence 12/06/2016  . Impingement syndrome of right shoulder 04/17/2015  . Fibrocystic breast 01/18/2015  . Gastroesophageal reflux disease without esophagitis 01/18/2015  . Hypothyroidism, postablative 01/18/2015  . Arteriosclerosis of coronary artery 12/29/2014  . DJD of shoulder 11/13/2014  . Fibromyalgia 10/02/2014  . Intercostal neuralgia 10/02/2014  . Facet syndrome, lumbar 10/02/2014  . Sacroiliac joint disease 10/02/2014  . Carotid artery narrowing 07/09/2014  . Benign essential HTN 07/01/2014  . Osteoarthritis of thumb 09/24/2012  . Mixed hyperlipidemia 09/22/2012  . Arthritis of hand, degenerative 09/22/2012    Past Surgical History:  Procedure Laterality Date  . ABDOMINAL HYSTERECTOMY     total  .  APPENDECTOMY    . BREAST BIOPSY Right    neg  . BREAST CYST ASPIRATION Left    neg  . BUNIONECTOMY Bilateral   . CESAREAN SECTION    . COLONOSCOPY  2010   normal  . COLONOSCOPY WITH PROPOFOL N/A 04/03/2018   Procedure: COLONOSCOPY WITH BIOPSIES;  Surgeon: Lucilla Lame, MD;  Location: [facility];  Service: Endoscopy;  Laterality: N/A;  sleep apnea  . CORONARY ANGIOPLASTY WITH STENT PLACEMENT  04/2006  . dental implant     [age]  . epidural steroid injection   2018  . SHOULDER ARTHROSCOPY Right 05/07/2015   Procedure: Extensive arthroscopic debridement and arthroscopic subacromial decompression, right shoulder.  2. Steroid injection right thumb CMC joint.;  Surgeon: Corky Mull, MD;  Location: [facility];  Service: Orthopedics;  Laterality: Right;  . TOOTH EXTRACTION     Upper centrals and lateral    OB History   No obstetric history on file.      Home Medications    Prior to Admission medications   Medication Sig Start Date End Date Taking? Authorizing Provider  aspirin EC 81 MG tablet Take 81 mg by mouth daily.   Yes [provider]  citalopram (CELEXA) 20 MG tablet TAKE 1 TABLET BY MOUTH  DAILY 10/12/18  Yes [name], MD  cyclobenzaprine (FLEXERIL) 10 MG tablet TAKE 1 TABLET BY MOUTH 3  TIMES DAILY 12/02/17  Yes [name], MD  diazepam (VALIUM) 5 MG tablet as needed. 04/08/19  Yes [provider]  Diclofenac-miSOPROStol 75-0.2 MG TBEC TAKE 1 TABLET BY MOUTH TWO  TIMES DAILY 11/23/18  Yes [name], MD  DULoxetine (CYMBALTA) 60 MG capsule TAKE 1 CAPSULE(60 MG) BY MOUTH DAILY 03/05/19  Yes [name], MD  esomeprazole (NEXIUM) 20 MG capsule Take 40 mg by mouth daily. Over the Counter   Yes [provider]  HYDROcodone-acetaminophen (NORCO/VICODIN) 5-325 MG tablet Take 1 tablet by mouth at bedtime. Limit 1-2 tablets by mouth per day if tolerated 04/30/19 05/30/19 Yes Molli Barrows, MD  levothyroxine (SYNTHROID) 88 MCG tablet TAKE 1 TABLET BY MOUTH  DAILY 03/25/19  Yes [name], MD  Loperamide HCl (IMODIUM PO) Take by mouth as needed.   Yes [provider]  losartan (COZAAR) 50 MG tablet Take 50 mg by mouth daily.   Yes [provider]  Multiple Vitamin (MULTI-VITAMINS) TABS Take by mouth.   Yes [provider]  NON FORMULARY    Yes [provider]  pravastatin (PRAVACHOL) 80 MG tablet Take 80 mg by mouth daily.    Yes [provider]  tolterodine (DETROL LA) 4 MG 24 hr capsule TAKE 1 CAPSULE(4 MG) BY MOUTH DAILY 05/12/18  Yes Gae Dry, MD  traMADol (ULTRAM) 50 MG tablet daily as needed. 04/08/19  Yes [provider]  ciprofloxacin (CIPRO) 500 MG tablet Take 1 tablet (500 mg total) by mouth 2 (two) times daily. 05/09/19   Coral Spikes, DO  HYDROcodone-acetaminophen (NORCO/VICODIN) 5-325 MG tablet Take 1 tablet by mouth daily. 05/29/19 06/28/19  Molli Barrows, MD    Family History Family History  Problem Relation Age of Onset  . Stroke Mother   . Heart disease Father   . Pancreatic cancer Sister   . Diabetes Sister   . Prostate cancer Brother   . Breast cancer Neg Hx   . Kidney cancer Neg Hx   . Bladder Cancer Neg Hx  Social History Social History   Tobacco Use  . Smoking status: Former Smoker    Packs/day: 1.25    Years: 44.00    Pack years: 55.00    Types: Cigarettes    Quit date: 04/22/2006    Years since quitting: 13.0  . Smokeless tobacco: Never Used  . Tobacco comment:    Substance Use Topics  . Alcohol use: No    Alcohol/week: 0.0 standard drinks  . Drug use: No     Allergies   Augmentin [amoxicillin-pot clavulanate], Codeine, Sulfa antibiotics, Proparacaine, and Shellfish allergy   Review of Systems Review of Systems  Constitutional: Positive for fatigue and fever.  Gastrointestinal: Negative.   Genitourinary: Positive for dysuria and urgency.  Neurological: Positive for weakness.   Physical Exam Triage Vital Signs ED Triage Vitals  Enc Vitals Group     BP 05/09/19 1241 123/85     Pulse Rate 05/09/19 1241 (!) 104     Resp 05/09/19 1241 18     Temp 05/09/19 1241 98.8 F (37.1 C)     Temp Source 05/09/19 1241 Oral     SpO2 05/09/19 1241 95 %     Weight 05/09/19 1242 190 lb (86.2 kg)     Height 05/09/19 1242 5\' 8"  (1.727 m)     Head Circumference --      Peak Flow --      Pain Score 05/09/19 1241 8     Pain Loc --      Pain Edu? --       Excl. in [place]? --    Updated Vital Signs BP 123/85 (BP Location: Left Arm)   Pulse (!) 104   Temp 98.8 F (37.1 C) (Oral)   Resp 18   Ht 5\' 8"  (1.727 m)   Wt 86.2 kg   SpO2 95%   BMI 28.89 kg/m   Visual Acuity Right Eye Distance:   Left Eye Distance:   Bilateral Distance:    Right Eye Near:   Left Eye Near:    Bilateral Near:     Physical Exam Vitals and nursing note reviewed.  Constitutional:      General: She is not in acute distress.    Appearance: She is not ill-appearing.     Comments: Appears fatigued.  HENT:     Head: Normocephalic and atraumatic.  Eyes:     General:        Right eye: No discharge.        Left eye: No discharge.     Conjunctiva/sclera: Conjunctivae normal.  Cardiovascular:     Rate and Rhythm: Regular rhythm. Tachycardia present.  Pulmonary:     Effort: Pulmonary effort is normal.     Breath sounds: Normal breath sounds. No wheezing, rhonchi or rales.  Abdominal:     General: There is no distension.     Palpations: Abdomen is soft.     Tenderness: There is no abdominal tenderness. There is no right CVA tenderness or left CVA tenderness.  Neurological:     Mental Status: She is alert.  Psychiatric:        Mood and Affect: Mood normal.        Behavior: Behavior normal.    UC Treatments / Results  Labs (all labs ordered are listed, but only abnormal results are displayed) Labs Reviewed  URINALYSIS, COMPLETE (UACMP) WITH MICROSCOPIC - Abnormal; Notable for the following components:      Result Value   APPearance CLOUDY (*)  Hgb urine dipstick MODERATE (*)    Protein, ur 100 (*)    Leukocytes,Ua LARGE (*)    Bacteria, UA MANY (*)    All other components within normal limits  CBC WITH DIFFERENTIAL/PLATELET - Abnormal; Notable for the following components:   RBC 3.79 (*)    Hemoglobin 11.6 (*)    HCT 34.6 (*)    Neutro Abs 8.0 (*)    All other components within normal limits  BASIC METABOLIC PANEL - Abnormal; Notable for the  following components:   Sodium 133 (*)    CO2 20 (*)    Glucose, Bld 117 (*)    Calcium 8.4 (*)    GFR calc non Af Amer 58 (*)    All other components within normal limits  SARS CORONAVIRUS 2 AG (30 MIN TAT)  URINE CULTURE    EKG   Radiology No results found.  Procedures Procedures (including critical care time)  Medications Ordered in UC Medications - No data to display  Initial Impression / Assessment and Plan / UC Course  I have reviewed the triage vital signs and the nursing notes.  Pertinent labs & imaging results that were available during my care of the patient were reviewed by me and considered in my medical decision making (see chart for details).    74 year old female presents with findings consistent with urinary tract infection.  Concern for developing pyelonephritis given systemic symptoms of fever and generalized weakness.  Sending culture.  Starting on Cipro.  CBC and metabolic panel unremarkable today.  Rapid Covid negative.    Final Clinical Impressions(s) / UC Diagnoses   Final diagnoses:  Urinary tract infection without hematuria, site unspecified     Discharge Instructions     Lots of fluids.  Antibiotic as prescribed.  Take care  Dr. Lacinda Axon     ED Prescriptions    Medication Sig Dispense Auth. Provider   ciprofloxacin (CIPRO) 500 MG tablet Take 1 tablet (500 mg total) by mouth 2 (two) times daily. 20 tablet Coral Spikes, DO     PDMP not reviewed this encounter.   Coral Spikes, [place] 05/09/19 1412

## 2019-05-14 ENCOUNTER — Ambulatory Visit: Payer: Medicare Other

## 2019-05-14 LAB — URINE CULTURE: Culture: >=100000 — AB

## 2019-05-18 ENCOUNTER — Ambulatory Visit: Payer: Medicare Other | Admitting: Obstetrics & Gynecology

## 2019-05-22 ENCOUNTER — Other Ambulatory Visit: Payer: Self-pay

## 2019-05-22 ENCOUNTER — Ambulatory Visit
Admission: RE | Admit: 2019-05-22 | Discharge: 2019-05-22 | Disposition: A | Discharge status: Home / Self Care | Payer: Medicare Other | Source: Ambulatory Visit | Attending: Internal Medicine | Admitting: Internal Medicine

## 2019-05-22 DIAGNOSIS — Z1231 Encounter for screening mammogram for malignant neoplasm of breast: Secondary | ICD-10-CM | POA: Diagnosis not present

## 2019-05-23 ENCOUNTER — Ambulatory Visit: Payer: Medicare Other | Attending: Anesthesiology | Admitting: Anesthesiology

## 2019-05-23 MED ORDER — HYDROCODONE-ACETAMINOPHEN 5-325 MG PO TABS: 1.0000 | ORAL_TABLET | Freq: Every day | ORAL | 0 refills | Status: DC

## 2019-05-31 ENCOUNTER — Other Ambulatory Visit: Payer: Self-pay

## 2019-05-31 ENCOUNTER — Ambulatory Visit: Payer: Medicare Other | Attending: Anesthesiology | Admitting: Anesthesiology

## 2019-06-05 ENCOUNTER — Other Ambulatory Visit: Payer: Self-pay

## 2019-06-05 ENCOUNTER — Encounter: Payer: Self-pay | Admitting: Internal Medicine

## 2019-06-05 ENCOUNTER — Ambulatory Visit: Payer: Medicare Other | Attending: Anesthesiology | Admitting: Anesthesiology

## 2019-06-05 ENCOUNTER — Ambulatory Visit (INDEPENDENT_AMBULATORY_CARE_PROVIDER_SITE_OTHER): Payer: Medicare Other | Admitting: Internal Medicine

## 2019-06-05 ENCOUNTER — Other Ambulatory Visit: Payer: Self-pay | Admitting: Obstetrics & Gynecology

## 2019-06-05 VITALS — BP 130/80 | HR 100 | Temp 96.9°F | Ht 68.0 in | Wt 185.0 lb

## 2019-06-05 DIAGNOSIS — N3000 Acute cystitis without hematuria: Secondary | ICD-10-CM | POA: Diagnosis not present

## 2019-06-05 DIAGNOSIS — K5901 Slow transit constipation: Secondary | ICD-10-CM | POA: Diagnosis not present

## 2019-06-05 LAB — POC URINALYSIS WITH MICROSCOPIC (NON AUTO)MANUAL RESULT
Bilirubin, UA: NEGATIVE
Blood, UA: NEGATIVE
Crystals: 0
Glucose, UA: NEGATIVE
Ketones, UA: NEGATIVE
Mucus, UA: 0
Nitrite, UA: NEGATIVE
Protein, UA: NEGATIVE
RBC: 0 M/uL — AB (ref 4.04–5.48)
Spec Grav, UA: 1.01 (ref 1.010–1.025)
Urobilinogen, UA: 0.2 E.U./dL
WBC Casts, UA: 5
pH, UA: 6 (ref 5.0–8.0)

## 2019-06-05 MED ORDER — CIPROFLOXACIN HCL 250 MG PO TABS: 250.0000 mg | ORAL_TABLET | Freq: Two times a day (BID) | ORAL | 0 refills | Status: AC

## 2019-06-05 MED ORDER — HYDROCODONE-ACETAMINOPHEN 5-325 MG PO TABS: 1.0000 | ORAL_TABLET | Freq: Every day | ORAL | 0 refills | Status: DC

## 2019-06-05 NOTE — Progress Notes (Signed)
Date:  06/05/2019   Name:  Carrie Myers   DOB:  26-Nov-1945   MRN:  [id number]   Chief Complaint: Urinary Tract Infection (X2-days was in hospital they gave her a antibiotic. Burning, feeling like she has to urinate but little to nothing comes out. no discharge, no itching )  Urinary Tract Infection  This is a new problem. The current episode started in the past 7 days. The problem occurs every urination. The quality of the pain is described as burning. The pain is mild. There has been no fever. Associated symptoms include frequency and urgency. Pertinent negatives include no chills, hesitancy, sweats or vomiting. She has tried nothing for the symptoms.  Constipation This is a chronic problem. The problem is unchanged. Her stool frequency is 1 time per day. The stool is described as pellet like. The patient is not on a high fiber diet. She does not exercise regularly. There has not been adequate water intake. Pertinent negatives include no fever or vomiting.   She was treated with Keflex for UTI early February.  Then Cipro one month ago.  Now symptoms have returned. Last month UCx + E Coli - pan sensitive Lab Results  Component Value Date   CREATININE 0.98 05/09/2019   BUN 16 05/09/2019   NA 133 (L) 05/09/2019   K 3.9 05/09/2019   CL 100 05/09/2019   CO2 20 (L) 05/09/2019   Lab Results  Component Value Date   CHOL 111 03/28/2019   HDL 43 03/28/2019   LDLCALC 47 03/28/2019   TRIG 119 03/28/2019   CHOLHDL 2.6 03/28/2019   Lab Results  Component Value Date   TSH 2.260 03/28/2019   No results found for: HGBA1C Lab Results  Component Value Date   WBC 10.2 05/09/2019   HGB 11.6 (L) 05/09/2019   HCT 34.6 (L) 05/09/2019   MCV 91.3 05/09/2019   PLT 231 05/09/2019   Lab Results  Component Value Date   ALT 31 03/28/2019   AST 30 03/28/2019   ALKPHOS 78 03/28/2019   BILITOT 0.3 03/28/2019     Review of Systems  Constitutional: Negative for chills, diaphoresis and  fever.  HENT: Negative for congestion.   Respiratory: Negative for shortness of breath and wheezing.   Cardiovascular: Negative for chest pain and palpitations.  Gastrointestinal: Positive for constipation. Negative for vomiting.  Genitourinary: Positive for frequency and urgency. Negative for hesitancy.  Neurological: Negative for dizziness and headaches.    Patient Active Problem List   Diagnosis Date Noted  . Sciatica, left side 01/01/2019  . Urinary urgency 05/12/2018  . Urinary frequency 05/12/2018  . Diverticulosis of colon 04/25/2018  . Change in bowel habits   . Tobacco use disorder, moderate, in sustained remission 12/22/2017  . Moderate episode of recurrent major depressive disorder ([place]) 10/03/2017  . Inflammatory spondylopathy of lumbar region ([place]) 10/03/2017  . Hoarseness, persistent 04/18/2017  . OSA on CPAP 02/18/2017  . Cervical disc disease 12/31/2016  . Generalized osteoarthritis 12/23/2016  . Chronic pain syndrome 12/23/2016  . Cystocele, midline 12/06/2016  . Urinary incontinence 12/06/2016  . Impingement syndrome of right shoulder 04/17/2015  . Fibrocystic breast 01/18/2015  . Gastroesophageal reflux disease without esophagitis 01/18/2015  . Hypothyroidism, postablative 01/18/2015  . Arteriosclerosis of coronary artery 12/29/2014  . DJD of shoulder 11/13/2014  . Fibromyalgia 10/02/2014  . Intercostal neuralgia 10/02/2014  . Facet syndrome, lumbar 10/02/2014  . Sacroiliac joint disease 10/02/2014  . Carotid artery narrowing 07/09/2014  . Benign  essential HTN 07/01/2014  . Osteoarthritis of thumb 09/24/2012  . Mixed hyperlipidemia 09/22/2012  . Arthritis of hand, degenerative 09/22/2012    Allergies  Allergen Reactions  . Augmentin [Amoxicillin-Pot Clavulanate] Nausea And Vomiting  . Codeine Nausea And Vomiting  . Sulfa Antibiotics Nausea And Vomiting  . Proparacaine Itching  . Shellfish Allergy Nausea And Vomiting    No issues with iodine      Past Surgical History:  Procedure Laterality Date  . ABDOMINAL HYSTERECTOMY     total  . APPENDECTOMY    . BREAST BIOPSY Right    neg  . BREAST CYST ASPIRATION Left    neg  . BUNIONECTOMY Bilateral   . CESAREAN SECTION    . COLONOSCOPY  2010   normal  . COLONOSCOPY WITH PROPOFOL N/A 04/03/2018   Procedure: COLONOSCOPY WITH BIOPSIES;  Surgeon: Lucilla Lame, MD;  Location: [facility];  Service: Endoscopy;  Laterality: N/A;  sleep apnea  . CORONARY ANGIOPLASTY WITH STENT PLACEMENT  04/2006  . dental implant     [age]  . epidural steroid injection  2018  . SHOULDER ARTHROSCOPY Right 05/07/2015   Procedure: Extensive arthroscopic debridement and arthroscopic subacromial decompression, right shoulder.  2. Steroid injection right thumb CMC joint.;  Surgeon: Corky Mull, MD;  Location: [place];  Service: Orthopedics;  Laterality: Right;  . TOOTH EXTRACTION     Upper centrals and lateral    Social History   Tobacco Use  . Smoking status: Former Smoker    Packs/day: 1.25    Years: 44.00    Pack years: 55.00    Types: Cigarettes    Quit date: 04/22/2006    Years since quitting: 13.1  . Smokeless tobacco: Never Used  . Tobacco comment:    Substance Use Topics  . Alcohol use: No    Alcohol/week: 0.0 standard drinks  . Drug use: No     Medication list has been reviewed and updated.  Current Meds  Medication Sig  . aspirin EC 81 MG tablet Take 81 mg by mouth daily.  . ciprofloxacin (CIPRO) 500 MG tablet Take 1 tablet (500 mg total) by mouth 2 (two) times daily. (Patient taking differently: Take 500 mg by mouth 2 (two) times daily. )  . citalopram (CELEXA) 20 MG tablet TAKE 1 TABLET BY MOUTH  DAILY  . cyclobenzaprine (FLEXERIL) 10 MG tablet TAKE 1 TABLET BY MOUTH 3  TIMES DAILY  . diazepam (VALIUM) 5 MG tablet as needed.  . Diclofenac-miSOPROStol 75-0.2 MG TBEC TAKE 1 TABLET BY MOUTH TWO  TIMES DAILY  . DULoxetine (CYMBALTA) 60 MG  capsule TAKE 1 CAPSULE(60 MG) BY MOUTH DAILY  . esomeprazole (NEXIUM) 20 MG capsule Take 40 mg by mouth daily. Over the Counter  . HYDROcodone-acetaminophen (NORCO/VICODIN) 5-325 MG tablet Take 1 tablet by mouth at bedtime. Limit 1-2 tablets by mouth per day if tolerated  . [START ON 07/02/2019] HYDROcodone-acetaminophen (NORCO/VICODIN) 5-325 MG tablet Take 1 tablet by mouth daily.  Marland Kitchen levothyroxine (SYNTHROID) 88 MCG tablet TAKE 1 TABLET BY MOUTH  DAILY  . Loperamide HCl (IMODIUM PO) Take by mouth as needed.  Marland Kitchen losartan (COZAAR) 50 MG tablet Take 50 mg by mouth daily.  . Multiple Vitamin (MULTI-VITAMINS) TABS Take by mouth.  . NON FORMULARY   . pravastatin (PRAVACHOL) 80 MG tablet Take 80 mg by mouth daily.  Marland Kitchen tolterodine (DETROL LA) 4 MG 24 hr capsule TAKE 1 CAPSULE(4 MG) BY MOUTH DAILY  . traMADol (ULTRAM) 50 MG tablet  daily as needed.    PHQ 2/9 Scores 06/05/2019 05/07/2019 04/19/2019 03/28/2019  PHQ - 2 Score 0 0 0 0  PHQ- 9 Score 0 - 5 0  Exception Documentation - - - -    BP Readings from Last 3 Encounters:  06/05/19 130/80  05/09/19 123/85  04/20/19 140/75    Physical Exam Vitals and nursing note reviewed.  Constitutional:      General: She is not in acute distress.    Appearance: Normal appearance. She is well-developed.  Cardiovascular:     Rate and Rhythm: Normal rate and regular rhythm.     Heart sounds: Normal heart sounds.  Pulmonary:     Effort: Pulmonary effort is normal. No respiratory distress.     Breath sounds: Normal breath sounds.  Abdominal:     General: Bowel sounds are normal.     Palpations: Abdomen is soft.     Tenderness: There is abdominal tenderness in the suprapubic area. There is no right CVA tenderness, left CVA tenderness, guarding or rebound.  Musculoskeletal:     Right lower leg: No edema.     Left lower leg: No edema.  Neurological:     Mental Status: She is alert.     Wt Readings from Last 3 Encounters:  06/05/19 185 lb (83.9 kg)   05/09/19 190 lb (86.2 kg)  05/07/19 190 lb (86.2 kg)    BP 130/80   Pulse 100   Temp (!) 96.9 F (36.1 C) (Temporal)   Ht 5\' 8"  (1.727 m)   Wt 185 lb (83.9 kg)   SpO2 98%   BMI 28.13 kg/m   Assessment and Plan: 1. Acute cystitis without hematuria Increase fluid to 1 liter + per day Has hx of cystocele - may need Urology evaluation if recurrent - POC urinalysis w microscopic (non auto) - ciprofloxacin (CIPRO) 250 MG tablet; Take 1 tablet (250 mg total) by mouth 2 (two) times daily for 7 days.  Dispense: 14 tablet; Refill: 0  2. Slow transit constipation Recommend Colace daily   Partially dictated using Editor, commissioning. Any errors are unintentional.  Halina Maidens, MD [facility]  06/05/2019

## 2019-06-08 ENCOUNTER — Telehealth: Payer: Self-pay | Admitting: *Deleted

## 2019-06-08 DIAGNOSIS — Z87891 Personal history of nicotine dependence: Secondary | ICD-10-CM

## 2019-06-08 NOTE — Telephone Encounter (Signed)
Smoking history: former, quit 2008, 55 pack year history.

## 2019-06-08 NOTE — Telephone Encounter (Signed)
(  06/08/19) Pt has been notified that lung cancer screening CT scan is due currently or will be in near future. Confirmed pt is within appropriate age range, and asymptomatic. Pt denies illness that would prevent curative treatment for lung cancer if found. Verified smoking history (Former Smoker since 2008, 1.25 ppd). Pt is agreeable for CT scan being scheduled, and prefers an appt after 10 am, and notes that she is not available on Tuesdays.  SRW

## 2019-06-08 NOTE — Addendum Note (Signed)
Addended by: Lieutenant Diego on: 06/08/2019 02:35 PM   Modules accepted: Orders

## 2019-06-11 NOTE — Telephone Encounter (Signed)
Patient informed of low dose lung cancer screening CT scan appointment on 06/14/19 at 1:30pm.

## 2019-06-14 ENCOUNTER — Ambulatory Visit
Admission: RE | Admit: 2019-06-14 | Discharge: 2019-06-14 | Disposition: A | Discharge status: Home / Self Care | Payer: Medicare Other | Source: Ambulatory Visit | Attending: Oncology | Admitting: Oncology

## 2019-06-14 ENCOUNTER — Other Ambulatory Visit: Payer: Self-pay

## 2019-06-14 DIAGNOSIS — Z87891 Personal history of nicotine dependence: Secondary | ICD-10-CM | POA: Diagnosis not present

## 2019-06-19 ENCOUNTER — Other Ambulatory Visit: Payer: Self-pay

## 2019-06-19 ENCOUNTER — Encounter: Payer: Self-pay | Admitting: *Deleted

## 2019-06-19 DIAGNOSIS — F331 Major depressive disorder, recurrent, moderate: Secondary | ICD-10-CM

## 2019-06-19 MED ORDER — DULOXETINE HCL 60 MG PO CPEP: ORAL_CAPSULE | ORAL | 0 refills | Status: DC

## 2019-06-20 ENCOUNTER — Ambulatory Visit (INDEPENDENT_AMBULATORY_CARE_PROVIDER_SITE_OTHER): Payer: Medicare Other | Admitting: Obstetrics & Gynecology

## 2019-06-20 ENCOUNTER — Encounter: Payer: Self-pay | Admitting: Obstetrics & Gynecology

## 2019-06-20 ENCOUNTER — Other Ambulatory Visit: Payer: Self-pay

## 2019-06-20 VITALS — BP 130/80 | Ht 68.0 in | Wt 190.0 lb

## 2019-06-20 DIAGNOSIS — R3915 Urgency of urination: Secondary | ICD-10-CM | POA: Diagnosis not present

## 2019-06-20 DIAGNOSIS — R350 Frequency of micturition: Secondary | ICD-10-CM | POA: Diagnosis not present

## 2019-06-20 MED ORDER — TOLTERODINE TARTRATE ER 4 MG PO CP24: ORAL_CAPSULE | ORAL | 3 refills | Status: DC

## 2019-06-20 NOTE — Progress Notes (Signed)
History of Present Illness:  Carrie Myers is a 73 y.o. who was started on Detrol LA  approximately 1 year ago.  Works well, more cost effective than prior medicines she has tried. Since that time, she states that her symptoms are improved.  Has diarrhea when tries to stop medicine, also return of OAB sx's.  PMHx: She  has a past medical history of Allergy, Anxiety, Arthritis, Bilateral dry eyes, Chronic low back pain (12/23/2016), DDD (degenerative disc disease), lumbar (10/02/2014), Dental crowns present, Fibromyalgia, Fibromyalgia, Fracture of right foot (01/14/2015), GERD (gastroesophageal reflux disease), Hyperlipidemia, Hypertension, Kidney stones, Myocardial infarction ([place]) (04/22/2006), Osteoarthritis, Other shoulder lesions, right shoulder (04/17/2015), Rheumatoid arthritis ([place]), Sciatica, left side (01/01/2019), Sleep apnea, Status post shoulder surgery (05/27/2015), Thyroid disease, and Vertigo. Also,  has a past surgical history that includes Appendectomy; Cesarean section; Abdominal hysterectomy; Bunionectomy (Bilateral); dental implant; Colonoscopy (2010); Coronary angioplasty with stent (04/2006); Shoulder arthroscopy (Right, 05/07/2015); epidural steroid injection (2018); Tooth extraction; Colonoscopy with propofol (N/A, 04/03/2018); Breast cyst aspiration (Left); and Breast biopsy (Right)., family history includes Diabetes in her sister; Heart disease in her father; Pancreatic cancer in her sister; Prostate cancer in her brother; Stroke in her mother.,  reports that she quit smoking about 13 years ago. Her smoking use included cigarettes. She has a 55.00 pack-year smoking history. She has never used smokeless tobacco. She reports that she does not drink alcohol or use drugs. Current Meds  Medication Sig  . aspirin EC 81 MG tablet Take 81 mg by mouth daily.  . citalopram (CELEXA) 20 MG tablet TAKE 1 TABLET BY MOUTH  DAILY  . cyclobenzaprine (FLEXERIL) 10 MG tablet TAKE 1 TABLET BY MOUTH 3   TIMES DAILY  . diazepam (VALIUM) 5 MG tablet as needed.  . Diclofenac-miSOPROStol 75-0.2 MG TBEC TAKE 1 TABLET BY MOUTH TWO  TIMES DAILY  . DULoxetine (CYMBALTA) 60 MG capsule TAKE 1 CAPSULE(60 MG) BY MOUTH DAILY  . esomeprazole (NEXIUM) 20 MG capsule Take 40 mg by mouth daily. Over the Counter  . HYDROcodone-acetaminophen (NORCO/VICODIN) 5-325 MG tablet Take 1 tablet by mouth at bedtime. Limit 1-2 tablets by mouth per day if tolerated  . [START ON 06/29/2019] HYDROcodone-acetaminophen (NORCO/VICODIN) 5-325 MG tablet Take 1 tablet by mouth daily.  Marland Kitchen levothyroxine (SYNTHROID) 88 MCG tablet TAKE 1 TABLET BY MOUTH  DAILY  . Loperamide HCl (IMODIUM PO) Take by mouth as needed.  Marland Kitchen losartan (COZAAR) 50 MG tablet Take 50 mg by mouth daily.  . Multiple Vitamin (MULTI-VITAMINS) TABS Take by mouth.  . NON FORMULARY   . pravastatin (PRAVACHOL) 80 MG tablet Take 80 mg by mouth daily.  Marland Kitchen tolterodine (DETROL LA) 4 MG 24 hr capsule TAKE 1 CAPSULE(4 MG) BY MOUTH DAILY  . traMADol (ULTRAM) 50 MG tablet daily as needed.  . Also, is allergic to augmentin [amoxicillin-pot clavulanate]; codeine; sulfa antibiotics; proparacaine; and shellfish allergy..  Review of Systems  All other systems reviewed and are negative.   Physical Exam:  BP 130/80   Ht 5\' 8"  (1.727 m)   Wt 190 lb (86.2 kg)   BMI 28.89 kg/m  Body mass index is 28.89 kg/m. Constitutional: Well nourished, well developed female in no acute distress.  Chest- CTA B Heart- RRR, no m/g/r Neck- no Thyromegaly, no LAN Neuro: Grossly intact Psych:  Normal mood and affect.    Assessment:  Problem List Items Addressed This Visit   Visit Diagnoses    Urinary frequency    -  Primary  Urinary urgency         Medication treatment is going very well for her OAB.  Plan: She will undergo no change in her medical therapy.  She was amenable to this plan and we will see her back for annual/PRN.  A total of 20 minutes were spent face-to-face with the  patient as well as preparation, review, communication, and documentation during this encounter.   Barnett Applebaum, MD, [facility], [facility] 06/20/2019  11:10 AM

## 2019-07-09 ENCOUNTER — Other Ambulatory Visit: Payer: Self-pay | Admitting: Anesthesiology

## 2019-07-18 ENCOUNTER — Encounter: Payer: Self-pay | Admitting: Internal Medicine

## 2019-07-18 ENCOUNTER — Ambulatory Visit (INDEPENDENT_AMBULATORY_CARE_PROVIDER_SITE_OTHER): Payer: Medicare Other | Admitting: Internal Medicine

## 2019-07-18 ENCOUNTER — Other Ambulatory Visit: Payer: Self-pay

## 2019-07-18 VITALS — BP 136/78 | HR 78 | Temp 97.5°F | Ht 68.0 in | Wt 191.0 lb

## 2019-07-18 DIAGNOSIS — R300 Dysuria: Secondary | ICD-10-CM

## 2019-07-18 DIAGNOSIS — N907 Vulvar cyst: Secondary | ICD-10-CM | POA: Diagnosis not present

## 2019-07-18 DIAGNOSIS — N393 Stress incontinence (female) (male): Secondary | ICD-10-CM | POA: Diagnosis not present

## 2019-07-18 LAB — POCT URINALYSIS DIPSTICK
Bilirubin, UA: NEGATIVE
Blood, UA: NEGATIVE
Glucose, UA: NEGATIVE
Ketones, UA: NEGATIVE
Leukocytes, UA: NEGATIVE
Nitrite, UA: NEGATIVE
Protein, UA: NEGATIVE
Spec Grav, UA: 1.01 (ref 1.010–1.025)
Urobilinogen, UA: 0.2 E.U./dL
pH, UA: 5 (ref 5.0–8.0)

## 2019-07-18 MED ORDER — ESTROGENS, CONJUGATED 0.625 MG/GM VA CREA: TOPICAL_CREAM | VAGINAL | 1 refills | Status: DC

## 2019-07-18 NOTE — Progress Notes (Signed)
Date:  07/18/2019   Name:  Carrie Myers   DOB:  09/26/1945   MRN:  [id number]   Chief Complaint: Urinary Tract Infection (Still having burning when urinating. Having to push to get urine to come out. Frequency but hard to get output. Constant. Has never gone away. 2 months now.)  Dysuria  This is a recurrent problem. The problem occurs every urination. The quality of the pain is described as burning. The pain is mild. There has been no fever. Associated symptoms include frequency and urgency. Pertinent negatives include no chills, nausea or vomiting. She has tried increased fluids for the symptoms. The treatment provided no relief.    Lab Results  Component Value Date   CREATININE 0.98 05/09/2019   BUN 16 05/09/2019   NA 133 (L) 05/09/2019   K 3.9 05/09/2019   CL 100 05/09/2019   CO2 20 (L) 05/09/2019   Lab Results  Component Value Date   CHOL 111 03/28/2019   HDL 43 03/28/2019   LDLCALC 47 03/28/2019   TRIG 119 03/28/2019   CHOLHDL 2.6 03/28/2019   Lab Results  Component Value Date   TSH 2.260 03/28/2019   No results found for: HGBA1C Lab Results  Component Value Date   WBC 10.2 05/09/2019   HGB 11.6 (L) 05/09/2019   HCT 34.6 (L) 05/09/2019   MCV 91.3 05/09/2019   PLT 231 05/09/2019   Lab Results  Component Value Date   ALT 31 03/28/2019   AST 30 03/28/2019   ALKPHOS 78 03/28/2019   BILITOT 0.3 03/28/2019     Review of Systems  Constitutional: Negative for chills, fatigue and fever.  Respiratory: Negative for chest tightness and shortness of breath.   Cardiovascular: Negative for chest pain.  Gastrointestinal: Negative for constipation, diarrhea, nausea and vomiting.  Genitourinary: Positive for dysuria, frequency, genital sores (cyst on labia) and urgency.  Neurological: Negative for dizziness and headaches.    Patient Active Problem List   Diagnosis Date Noted  . Slow transit constipation 06/05/2019  . Sciatica, left side 01/01/2019  .  Diverticulosis of colon 04/25/2018  . Change in bowel habits   . Tobacco use disorder, moderate, in sustained remission 12/22/2017  . Moderate episode of recurrent major depressive disorder ([place]) 10/03/2017  . Inflammatory spondylopathy of lumbar region ([place]) 10/03/2017  . Hoarseness, persistent 04/18/2017  . OSA on CPAP 02/18/2017  . Cervical disc disease 12/31/2016  . Generalized osteoarthritis 12/23/2016  . Chronic pain syndrome 12/23/2016  . Cystocele, midline 12/06/2016  . Urinary incontinence 12/06/2016  . Impingement syndrome of right shoulder 04/17/2015  . Fibrocystic breast 01/18/2015  . Gastroesophageal reflux disease without esophagitis 01/18/2015  . Hypothyroidism, postablative 01/18/2015  . Arteriosclerosis of coronary artery 12/29/2014  . DJD of shoulder 11/13/2014  . Fibromyalgia 10/02/2014  . Intercostal neuralgia 10/02/2014  . Facet syndrome, lumbar 10/02/2014  . Sacroiliac joint disease 10/02/2014  . Carotid artery narrowing 07/09/2014  . Benign essential HTN 07/01/2014  . Osteoarthritis of thumb 09/24/2012  . Mixed hyperlipidemia 09/22/2012  . Arthritis of hand, degenerative 09/22/2012    Allergies  Allergen Reactions  . Augmentin [Amoxicillin-Pot Clavulanate] Nausea And Vomiting  . Codeine Nausea And Vomiting  . Sulfa Antibiotics Nausea And Vomiting  . Proparacaine Itching  . Shellfish Allergy Nausea And Vomiting    No issues with iodine     Past Surgical History:  Procedure Laterality Date  . ABDOMINAL HYSTERECTOMY     total  . APPENDECTOMY    .  BREAST BIOPSY Right    neg  . BREAST CYST ASPIRATION Left    neg  . BUNIONECTOMY Bilateral   . CESAREAN SECTION    . COLONOSCOPY  2010   normal  . COLONOSCOPY WITH PROPOFOL N/A 04/03/2018   Procedure: COLONOSCOPY WITH BIOPSIES;  Surgeon: Lucilla Lame, MD;  Location: [facility];  Service: Endoscopy;  Laterality: N/A;  sleep apnea  . CORONARY ANGIOPLASTY WITH STENT PLACEMENT  04/2006  . dental  implant     [age]  . epidural steroid injection  2018  . SHOULDER ARTHROSCOPY Right 05/07/2015   Procedure: Extensive arthroscopic debridement and arthroscopic subacromial decompression, right shoulder.  2. Steroid injection right thumb CMC joint.;  Surgeon: Corky Mull, MD;  Location: [facility];  Service: Orthopedics;  Laterality: Right;  . TOOTH EXTRACTION     Upper centrals and lateral    Social History   Tobacco Use  . Smoking status: Former Smoker    Packs/day: 1.25    Years: 44.00    Pack years: 55.00    Types: Cigarettes    Quit date: 04/22/2006    Years since quitting: 13.2  . Smokeless tobacco: Never Used  . Tobacco comment:    Substance Use Topics  . Alcohol use: No    Alcohol/week: 0.0 standard drinks  . Drug use: No     Medication list has been reviewed and updated.  Current Meds  Medication Sig  . aspirin EC 81 MG tablet Take 81 mg by mouth daily.  . citalopram (CELEXA) 20 MG tablet TAKE 1 TABLET BY MOUTH  DAILY  . cyclobenzaprine (FLEXERIL) 10 MG tablet TAKE 1 TABLET BY MOUTH 3  TIMES DAILY  . diazepam (VALIUM) 5 MG tablet as needed.  . Diclofenac-miSOPROStol 75-0.2 MG TBEC TAKE 1 TABLET BY MOUTH TWO  TIMES DAILY  . DULoxetine (CYMBALTA) 60 MG capsule TAKE 1 CAPSULE(60 MG) BY MOUTH DAILY  . esomeprazole (NEXIUM) 20 MG capsule Take 40 mg by mouth daily. Over the Counter  . HYDROcodone-acetaminophen (NORCO/VICODIN) 5-325 MG tablet Take 1 tablet by mouth daily.  Marland Kitchen levothyroxine (SYNTHROID) 88 MCG tablet TAKE 1 TABLET BY MOUTH  DAILY  . Loperamide HCl (IMODIUM PO) Take by mouth as needed.  Marland Kitchen losartan (COZAAR) 50 MG tablet Take 50 mg by mouth daily.  . Multiple Vitamin (MULTI-VITAMINS) TABS Take by mouth.  . NON FORMULARY   . pravastatin (PRAVACHOL) 80 MG tablet Take 80 mg by mouth daily.  Marland Kitchen tolterodine (DETROL LA) 4 MG 24 hr capsule TAKE 1 CAPSULE(4 MG) BY MOUTH DAILY  . traMADol (ULTRAM) 50 MG tablet daily as needed.    PHQ  2/9 Scores 07/18/2019 06/05/2019 05/07/2019 04/19/2019  PHQ - 2 Score 0 0 0 0  PHQ- 9 Score 0 0 - 5  Exception Documentation - - - -    BP Readings from Last 3 Encounters:  07/18/19 136/78  06/20/19 130/80  06/05/19 130/80    Physical Exam Vitals and nursing note reviewed.  Constitutional:      General: She is not in acute distress.    Appearance: She is well-developed.  HENT:     Head: Normocephalic and atraumatic.  Pulmonary:     Effort: Pulmonary effort is normal. No respiratory distress.  Abdominal:     General: Bowel sounds are normal.     Palpations: Abdomen is soft.     Tenderness: There is no abdominal tenderness.  Genitourinary:    Urethra: No prolapse or urethral lesion.  Adnexa: Right adnexa normal and left adnexa normal.       Comments: No obvious cystocele on internal exam and valsalva Musculoskeletal:        General: Normal range of motion.  Skin:    General: Skin is warm and dry.     Findings: No rash.  Neurological:     Mental Status: She is alert and oriented to person, place, and time.  Psychiatric:        Behavior: Behavior normal.        Thought Content: Thought content normal.     Wt Readings from Last 3 Encounters:  07/18/19 191 lb (86.6 kg)  06/20/19 190 lb (86.2 kg)  06/14/19 189 lb (85.7 kg)    BP 136/78   Pulse 78   Temp (!) 97.5 F (36.4 C) (Oral)   Ht 5\' 8"  (1.727 m)   Wt 191 lb (86.6 kg)   BMI 29.04 kg/m   Assessment and Plan: 1. Dysuria No evidence of UTI on UA No large cystocele noted on exam Will try topical estrogen nightly - if no benefit, will refer to urology - conjugated estrogens (PREMARIN) vaginal cream; Apply a green pea amount to the urethral opening nightly  Dispense: 42.5 g; Refill: 1  2. Stress incontinence of urine Continue Detrol prescribed by GYN  3. Labial cyst Pt is reassured - no treatment is needed at this time   Partially dictated using Editor, commissioning. Any errors are unintentional.  Halina Maidens, MD [facility]  07/18/2019

## 2019-08-04 ENCOUNTER — Other Ambulatory Visit: Payer: Self-pay | Admitting: Anesthesiology

## 2019-08-15 ENCOUNTER — Telehealth: Payer: Self-pay

## 2019-08-15 ENCOUNTER — Other Ambulatory Visit: Payer: Self-pay | Admitting: Obstetrics & Gynecology

## 2019-08-15 MED ORDER — MIRABEGRON ER 25 MG PO TB24: 25.0000 mg | ORAL_TABLET | Freq: Every day | ORAL | 7 refills | Status: DC

## 2019-08-15 NOTE — Telephone Encounter (Signed)
ERx Myrbetriq sent to pharmacy

## 2019-08-15 NOTE — Telephone Encounter (Signed)
Pt would like to be taken off of current bladder medication, says it is not working. She would like to go back to the one she started on.

## 2019-08-15 NOTE — Telephone Encounter (Signed)
Called pt, no answer, LVMTRC. 

## 2019-08-16 ENCOUNTER — Other Ambulatory Visit: Payer: Self-pay | Admitting: Anesthesiology

## 2019-08-16 NOTE — Telephone Encounter (Signed)
Pt aware.

## 2019-08-27 ENCOUNTER — Other Ambulatory Visit: Payer: Self-pay | Admitting: Internal Medicine

## 2019-08-27 DIAGNOSIS — F331 Major depressive disorder, recurrent, moderate: Secondary | ICD-10-CM

## 2019-09-10 ENCOUNTER — Other Ambulatory Visit: Payer: Self-pay

## 2019-09-10 ENCOUNTER — Encounter: Payer: Self-pay | Admitting: Anesthesiology

## 2019-09-10 ENCOUNTER — Ambulatory Visit: Payer: Medicare Other | Attending: Anesthesiology | Admitting: Anesthesiology

## 2019-09-10 DIAGNOSIS — M797 Fibromyalgia: Secondary | ICD-10-CM

## 2019-09-10 DIAGNOSIS — M5441 Lumbago with sciatica, right side: Secondary | ICD-10-CM | POA: Diagnosis not present

## 2019-09-10 DIAGNOSIS — M5136 Other intervertebral disc degeneration, lumbar region: Secondary | ICD-10-CM | POA: Diagnosis not present

## 2019-09-10 DIAGNOSIS — G8929 Other chronic pain: Secondary | ICD-10-CM

## 2019-09-10 DIAGNOSIS — G894 Chronic pain syndrome: Secondary | ICD-10-CM | POA: Diagnosis not present

## 2019-09-10 DIAGNOSIS — M545 Low back pain: Secondary | ICD-10-CM

## 2019-09-10 DIAGNOSIS — F1190 Opioid use, unspecified, uncomplicated: Secondary | ICD-10-CM

## 2019-09-10 DIAGNOSIS — M5442 Lumbago with sciatica, left side: Secondary | ICD-10-CM | POA: Diagnosis not present

## 2019-09-10 DIAGNOSIS — M159 Polyosteoarthritis, unspecified: Secondary | ICD-10-CM

## 2019-09-10 DIAGNOSIS — M47812 Spondylosis without myelopathy or radiculopathy, cervical region: Secondary | ICD-10-CM

## 2019-09-10 MED ORDER — HYDROCODONE-ACETAMINOPHEN 5-325 MG PO TABS: 1.0000 | ORAL_TABLET | Freq: Every day | ORAL | 0 refills | Status: DC | PRN

## 2019-09-10 MED ORDER — DIAZEPAM 5 MG PO TABS: 5.0000 mg | ORAL_TABLET | Freq: Every evening | ORAL | 3 refills | Status: AC | PRN

## 2019-09-10 MED ORDER — TRAMADOL HCL 50 MG PO TABS: 50.0000 mg | ORAL_TABLET | Freq: Four times a day (QID) | ORAL | 2 refills | Status: AC | PRN

## 2019-09-10 NOTE — Progress Notes (Signed)
Virtual Visit via Telephone Note  I connected with Carrie Myers on 09/10/19 at  2:15 PM EDT by telephone and verified that I am speaking with the correct person using two identifiers.  Location: Patient: Home Provider: Pain control center   I discussed the limitations, risks, security and privacy concerns of performing an evaluation and management service by telephone and the availability of in person appointments. I also discussed with the patient that there may be a patient responsible charge related to this service. The patient expressed understanding and agreed to proceed.   History of Present Illness: I spoke with Carrie Myers via telephone as she was able to do the video portion of the virtual conference.  She reports that her low back pain has been stable in nature she occasionally has some diffuse body pain but this is generally well-tolerated with the tramadol.  She takes this 2 in the morning 1 at night and in the afternoon she will take 1 hydrocodone which this combination keeps her pain under good control.  Furthermore she will use a Valium to assist with muscle spasms and sleep 1 time at bedtime.  She has been very compliant with this regimen and has not had any difficulty with side effects reported.  Otherwise the quality characteristic and distribution of her body pain has been stable in nature and unfortunately has failed more conservative therapy with anti-inflammatory medications and stretching strengthening exercises alone.  Otherwise no changes reported at this time her upper and lower extremity strength is been stable and otherwise she has been doing well.  She denies any diverting or illicit use of her medications as well.    Observations/Objective:  Current Outpatient Medications:  .  aspirin EC 81 MG tablet, Take 81 mg by mouth daily., Disp: , Rfl:  .  citalopram (CELEXA) 20 MG tablet, TAKE 1 TABLET BY MOUTH  DAILY, Disp: 90 tablet, Rfl: 3 .  conjugated estrogens  (PREMARIN) vaginal cream, Apply a green pea amount to the urethral opening nightly, Disp: 42.5 g, Rfl: 1 .  cyclobenzaprine (FLEXERIL) 10 MG tablet, TAKE 1 TABLET BY MOUTH 3  TIMES DAILY, Disp: 270 tablet, Rfl: 0 .  diazepam (VALIUM) 5 MG tablet, Take 1 tablet (5 mg total) by mouth at bedtime as needed., Disp: 30 tablet, Rfl: 3 .  Diclofenac-miSOPROStol 75-0.2 MG TBEC, TAKE 1 TABLET BY MOUTH TWO  TIMES DAILY, Disp: 180 tablet, Rfl: 3 .  DULoxetine (CYMBALTA) 60 MG capsule, TAKE 1 CAPSULE BY MOUTH  DAILY, Disp: 90 capsule, Rfl: 3 .  esomeprazole (NEXIUM) 20 MG capsule, Take 40 mg by mouth daily. Over the Counter, Disp: , Rfl:  .  [START ON 09/15/2019] HYDROcodone-acetaminophen (NORCO/VICODIN) 5-325 MG tablet, Take 1 tablet by mouth daily as needed for moderate pain. Limit 1-2 tablets by mouth per day if tolerated, Disp: 30 tablet, Rfl: 0 .  [START ON 10/16/2019] HYDROcodone-acetaminophen (NORCO/VICODIN) 5-325 MG tablet, Take 1 tablet by mouth daily as needed for moderate pain., Disp: 30 tablet, Rfl: 0 .  levothyroxine (SYNTHROID) 88 MCG tablet, TAKE 1 TABLET BY MOUTH  DAILY, Disp: 90 tablet, Rfl: 3 .  Loperamide HCl (IMODIUM PO), Take by mouth as needed., Disp: , Rfl:  .  losartan (COZAAR) 50 MG tablet, Take 50 mg by mouth daily., Disp: , Rfl:  .  mirabegron ER (MYRBETRIQ) 25 MG TB24 tablet, Take 1 tablet (25 mg total) by mouth daily., Disp: 30 tablet, Rfl: 7 .  Multiple Vitamin (MULTI-VITAMINS) TABS, Take by mouth., Disp: ,  Rfl:  .  NON FORMULARY, , Disp: , Rfl:  .  pravastatin (PRAVACHOL) 80 MG tablet, Take 80 mg by mouth daily., Disp: , Rfl:  .  tolterodine (DETROL LA) 4 MG 24 hr capsule, TAKE 1 CAPSULE(4 MG) BY MOUTH DAILY, Disp: 90 capsule, Rfl: 3 .  traMADol (ULTRAM) 50 MG tablet, Take 1 tablet (50 mg total) by mouth every 6 (six) hours as needed., Disp: 90 tablet, Rfl: 2  Assessment and Plan: 1. Chronic bilateral low back pain with bilateral sciatica   2. DDD (degenerative disc disease), lumbar    3. Chronic pain syndrome   4. Fibromyalgia   5. Chronic, continuous use of opioids   6. Cervical facet joint syndrome   7. Chronic bilateral low back pain without sciatica   8. Generalized osteoarthritis   Based on our discussion today and upon review of the [facility] practitioner database information going to refill her medications for the next few months.  This will be for her tramadol to be taken 2 tablets in the morning 1 at night in addition to 1 hydrocodone tablet in the afternoon for pain relief.  Furthermore she is to take her Valium 1 tablet at bedtime to assist with muscle spasms and sleep.  She is to continue follow-up with her primary care physicians for her baseline medical care.  We have requested that she present for a routine check on urine drug screen per clinic policy.  We will schedule her for return to clinic in approximately 2 to 3 months.  Follow Up Instructions:    I discussed the assessment and treatment plan with the patient. The patient was provided an opportunity to ask questions and all were answered. The patient agreed with the plan and demonstrated an understanding of the instructions.   The patient was advised to call back or seek an in-person evaluation if the symptoms worsen or if the condition fails to improve as anticipated.  I provided 30 minutes of non-face-to-face time during this encounter.   Carrie Barrows, MD

## 2019-09-11 ENCOUNTER — Encounter: Payer: Medicare Other | Admitting: Anesthesiology

## 2019-09-23 ENCOUNTER — Other Ambulatory Visit: Payer: Self-pay | Admitting: Internal Medicine

## 2019-09-23 DIAGNOSIS — F331 Major depressive disorder, recurrent, moderate: Secondary | ICD-10-CM

## 2019-09-23 NOTE — Telephone Encounter (Signed)
Requested Prescriptions  Pending Prescriptions Disp Refills  . DULoxetine (CYMBALTA) 60 MG capsule [Pharmacy Med Name: DULOXETINE DR 60MG  CAPSULES] 30 capsule 0    Sig: TAKE 1 CAPSULE(60 MG) BY MOUTH DAILY     Psychiatry: Antidepressants - SNRI Passed - 09/23/2019  3:14 PM      Passed - Completed PHQ-2 or PHQ-9 in the last 360 days.      Passed - Last BP in normal range    BP Readings from Last 1 Encounters:  07/18/19 136/78         Passed - Valid encounter within last 6 months    Recent Outpatient Visits          2 months ago [facility] [name], MD   3 months ago Acute cystitis without hematuria   [facility] [name], MD   5 months ago COVID-19 virus infection   [place], [place]   5 months ago Benign essential HTN   [facility] [name], MD   1 year ago Benign essential HTN   [facility] [name], MD      Future Appointments            In 6 months Army Melia Jesse Sans, MD [facility]

## 2019-09-26 ENCOUNTER — Ambulatory Visit: Payer: Medicare Other | Admitting: Internal Medicine

## 2019-11-06 ENCOUNTER — Other Ambulatory Visit: Payer: Self-pay

## 2019-11-06 ENCOUNTER — Ambulatory Visit: Payer: Medicare Other | Admitting: Anesthesiology

## 2019-11-07 ENCOUNTER — Telehealth: Payer: Self-pay | Admitting: *Deleted

## 2019-11-07 NOTE — Telephone Encounter (Signed)
Message sent to Dr Andree Elk.

## 2019-11-08 ENCOUNTER — Ambulatory Visit: Payer: Medicare Other | Attending: Anesthesiology | Admitting: Anesthesiology

## 2019-11-08 ENCOUNTER — Other Ambulatory Visit: Payer: Self-pay

## 2019-11-08 ENCOUNTER — Encounter: Payer: Self-pay | Admitting: Anesthesiology

## 2019-11-08 DIAGNOSIS — M797 Fibromyalgia: Secondary | ICD-10-CM | POA: Diagnosis not present

## 2019-11-08 DIAGNOSIS — M5136 Other intervertebral disc degeneration, lumbar region: Secondary | ICD-10-CM | POA: Diagnosis not present

## 2019-11-08 DIAGNOSIS — M5441 Lumbago with sciatica, right side: Secondary | ICD-10-CM | POA: Diagnosis not present

## 2019-11-08 DIAGNOSIS — G894 Chronic pain syndrome: Secondary | ICD-10-CM | POA: Diagnosis not present

## 2019-11-08 DIAGNOSIS — M5442 Lumbago with sciatica, left side: Secondary | ICD-10-CM | POA: Diagnosis not present

## 2019-11-08 DIAGNOSIS — G8929 Other chronic pain: Secondary | ICD-10-CM | POA: Diagnosis not present

## 2019-11-08 DIAGNOSIS — M545 Low back pain: Secondary | ICD-10-CM

## 2019-11-08 DIAGNOSIS — M5432 Sciatica, left side: Secondary | ICD-10-CM

## 2019-11-08 DIAGNOSIS — F1190 Opioid use, unspecified, uncomplicated: Secondary | ICD-10-CM

## 2019-11-08 DIAGNOSIS — M47812 Spondylosis without myelopathy or radiculopathy, cervical region: Secondary | ICD-10-CM

## 2019-11-08 DIAGNOSIS — M159 Polyosteoarthritis, unspecified: Secondary | ICD-10-CM

## 2019-11-08 MED ORDER — HYDROCODONE-ACETAMINOPHEN 5-325 MG PO TABS: 1.0000 | ORAL_TABLET | Freq: Two times a day (BID) | ORAL | 0 refills | Status: DC | PRN

## 2019-11-08 MED ORDER — TRAMADOL HCL 50 MG PO TABS: 50.0000 mg | ORAL_TABLET | Freq: Three times a day (TID) | ORAL | 1 refills | Status: AC

## 2019-11-08 NOTE — Progress Notes (Signed)
Virtual Visit via Telephone Note  I connected with Carrie Myers on 11/08/19 at  1:00 PM EDT by telephone and verified that I am speaking with the correct person using two identifiers.  Location: Patient: Home Provider: Pain control center   I discussed the limitations, risks, security and privacy concerns of performing an evaluation and management service by telephone and the availability of in person appointments. I also discussed with the patient that there may be a patient responsible charge related to this service. The patient expressed understanding and agreed to proceed.   History of Present Illness: I spoke with Carrie Myers today via telephone as she was unable to do the video portion of the virtual conference.  She reports that she is doing reasonably well with her low back pain and diffuse body pain.  She takes her hydrocodone only on days where she is hurting and is averaging approximately 40 tablets/month use.  She also taking her Valium 1 tablet at bedtime and several hours after the hydrocodone.  She is taking tramadol 2 tablets in the morning and 1 in the afternoon.  This combination has kept her low back pain and diffuse body pain under good control.  Her fibromyalgia is also responding to stretching and an active lifestyle.  The medications are reported to help with this.  No side effects are noted.  Her strength is been stable.    Observations/Objective:  Current Outpatient Medications:  .  aspirin EC 81 MG tablet, Take 81 mg by mouth daily., Disp: , Rfl:  .  citalopram (CELEXA) 20 MG tablet, TAKE 1 TABLET BY MOUTH  DAILY, Disp: 90 tablet, Rfl: 3 .  conjugated estrogens (PREMARIN) vaginal cream, Apply a green pea amount to the urethral opening nightly, Disp: 42.5 g, Rfl: 1 .  cyclobenzaprine (FLEXERIL) 10 MG tablet, TAKE 1 TABLET BY MOUTH 3  TIMES DAILY, Disp: 270 tablet, Rfl: 0 .  Diclofenac-miSOPROStol 75-0.2 MG TBEC, TAKE 1 TABLET BY MOUTH TWO  TIMES DAILY, Disp:  180 tablet, Rfl: 3 .  DULoxetine (CYMBALTA) 60 MG capsule, TAKE 1 CAPSULE(60 MG) BY MOUTH DAILY, Disp: 30 capsule, Rfl: 0 .  esomeprazole (NEXIUM) 20 MG capsule, Take 40 mg by mouth daily. Over the Counter, Disp: , Rfl:  .  HYDROcodone-acetaminophen (NORCO/VICODIN) 5-325 MG tablet, Take 1 tablet by mouth 2 (two) times daily as needed for moderate pain. Limit 1-2 tablets by mouth per day if tolerated, Disp: 45 tablet, Rfl: 0 .  [START ON 12/08/2019] HYDROcodone-acetaminophen (NORCO/VICODIN) 5-325 MG tablet, Take 1 tablet by mouth 2 (two) times daily as needed for moderate pain., Disp: 45 tablet, Rfl: 0 .  levothyroxine (SYNTHROID) 88 MCG tablet, TAKE 1 TABLET BY MOUTH  DAILY, Disp: 90 tablet, Rfl: 3 .  Loperamide HCl (IMODIUM PO), Take by mouth as needed., Disp: , Rfl:  .  losartan (COZAAR) 50 MG tablet, Take 50 mg by mouth daily., Disp: , Rfl:  .  mirabegron ER (MYRBETRIQ) 25 MG TB24 tablet, Take 1 tablet (25 mg total) by mouth daily., Disp: 30 tablet, Rfl: 7 .  Multiple Vitamin (MULTI-VITAMINS) TABS, Take by mouth., Disp: , Rfl:  .  NON FORMULARY, , Disp: , Rfl:  .  pravastatin (PRAVACHOL) 80 MG tablet, Take 80 mg by mouth daily., Disp: , Rfl:  .  tolterodine (DETROL LA) 4 MG 24 hr capsule, TAKE 1 CAPSULE(4 MG) BY MOUTH DAILY, Disp: 90 capsule, Rfl: 3 .  [START ON 12/13/2019] traMADol (ULTRAM) 50 MG tablet, Take 1 tablet (50  mg total) by mouth 3 (three) times daily., Disp: 90 tablet, Rfl: 1  Assessment and Plan:  1. Chronic bilateral low back pain with bilateral sciatica   2. DDD (degenerative disc disease), lumbar   3. Chronic pain syndrome   4. Fibromyalgia   5. Chronic, continuous use of opioids   6. Cervical facet joint syndrome   7. Chronic bilateral low back pain without sciatica   8. Generalized osteoarthritis   9. Sciatica, left side   Based on our discussion today and upon review of the [facility] practitioner database information I feel that it is appropriate to go ahead and  refill her hydrocodone.  At present she is not due for refill on her Valium.  She is due for urine screen for routine monitoring.  I want her to continue with her basic stretching strengthening exercises and maintain her current pain management protocol.  In the past she has benefited from epidural steroids and she feels that she may need to proceed with another 1 of these in the near future but not at this point.  I want her to continue with core strengthening and continue follow-up with her primary care physicians for her baseline medical care.  We will schedule her for 50-month return to clinic. Follow Up Instructions:    I discussed the assessment and treatment plan with the patient. The patient was provided an opportunity to ask questions and all were answered. The patient agreed with the plan and demonstrated an understanding of the instructions.   The patient was advised to call back or seek an in-person evaluation if the symptoms worsen or if the condition fails to improve as anticipated.  I provided 30 minutes of non-face-to-face time during this encounter.   Molli Barrows, MD

## 2019-11-09 DIAGNOSIS — Z23 Encounter for immunization: Secondary | ICD-10-CM | POA: Diagnosis not present

## 2019-11-09 DIAGNOSIS — H6122 Impacted cerumen, left ear: Secondary | ICD-10-CM | POA: Diagnosis not present

## 2019-11-26 IMAGING — MG DIGITAL SCREENING BILATERAL MAMMOGRAM WITH TOMO AND CAD
8 series · 8 of 24 positions shown · non-contrast
Comparison: Previous exam(s).

CLINICAL DATA: Screening.

EXAM:
DIGITAL SCREENING BILATERAL MAMMOGRAM WITH TOMO AND CAD

[R MLO synth-2D]
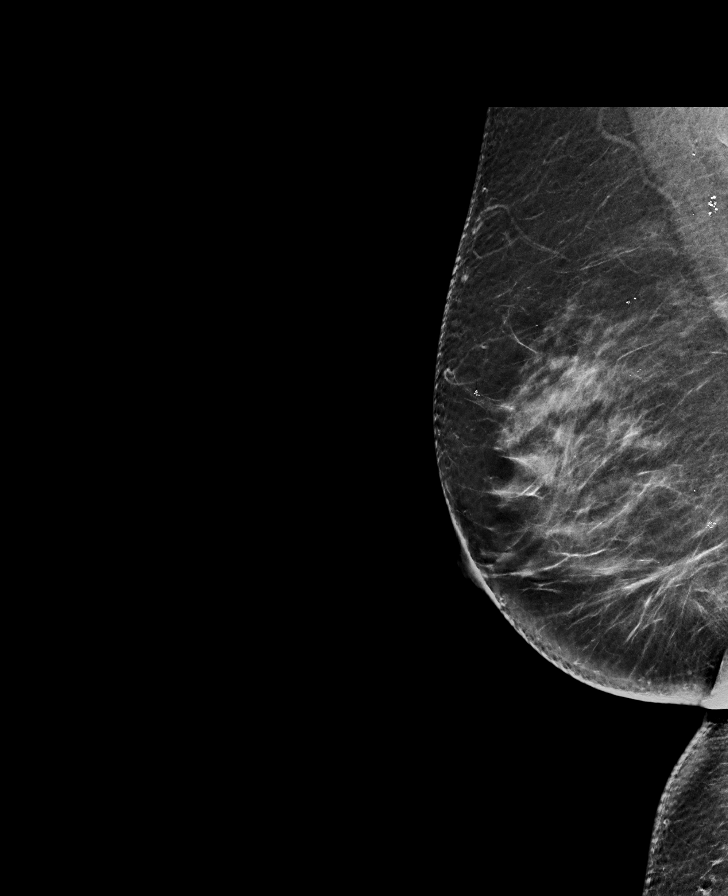

[L MLO synth-2D]
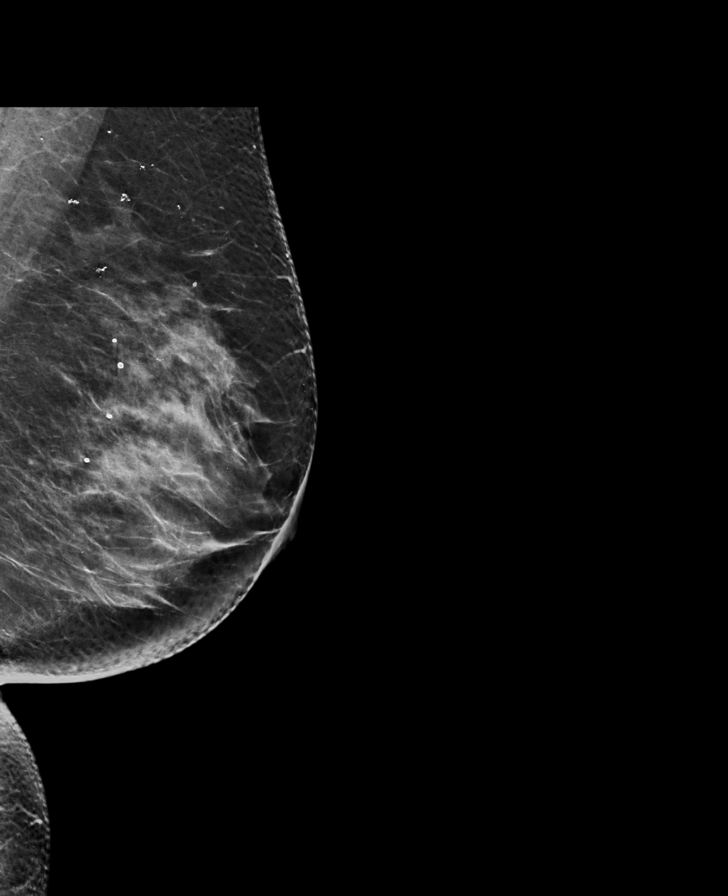

[R CC synth-2D]
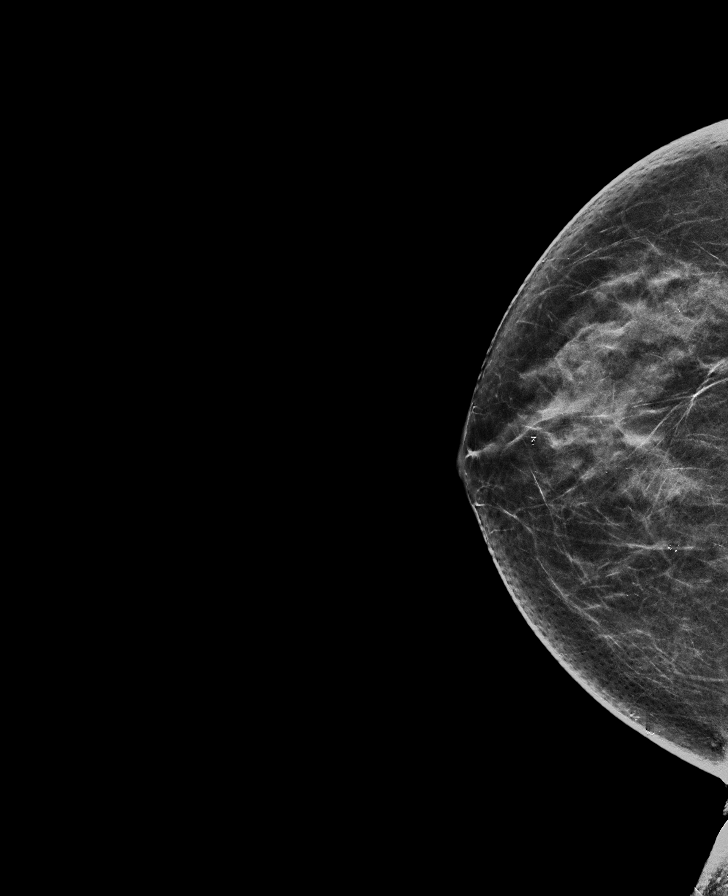

[L CC synth-2D]
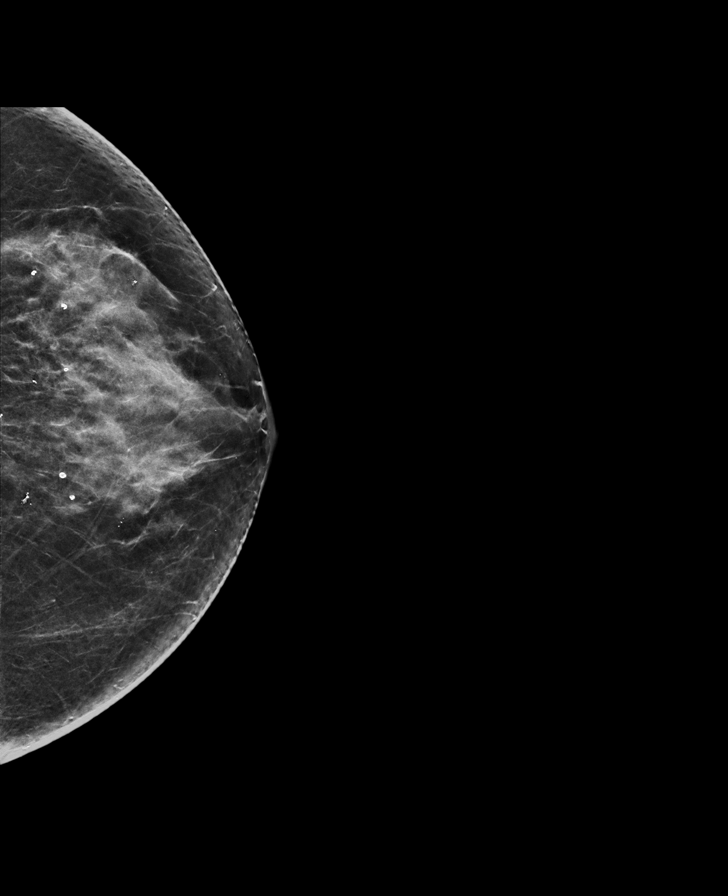

[L CC tomo · tomo slice 35/70.0]
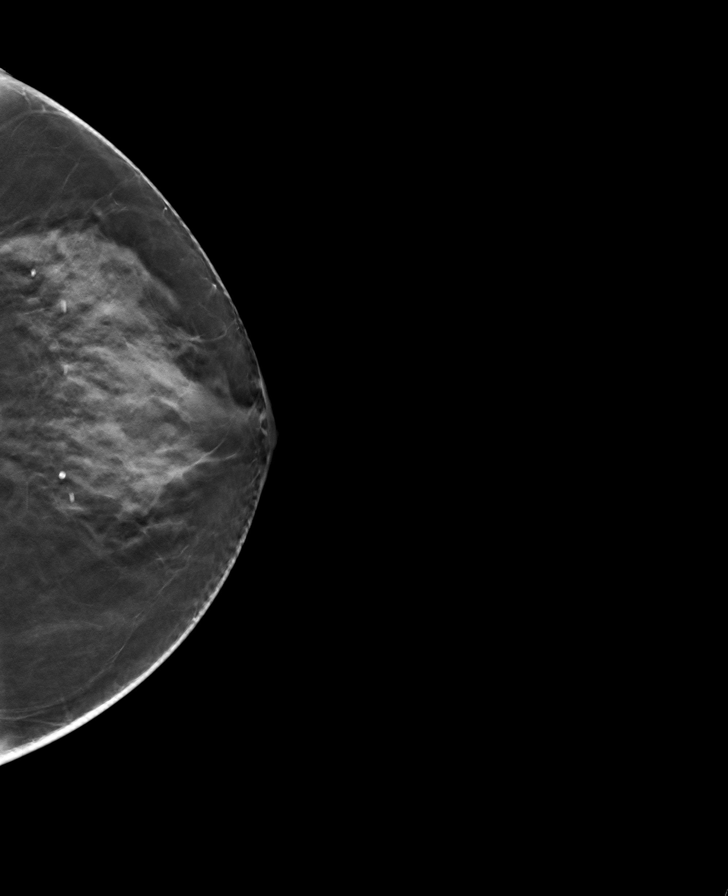

[L MLO tomo · tomo slice 38/75.0]
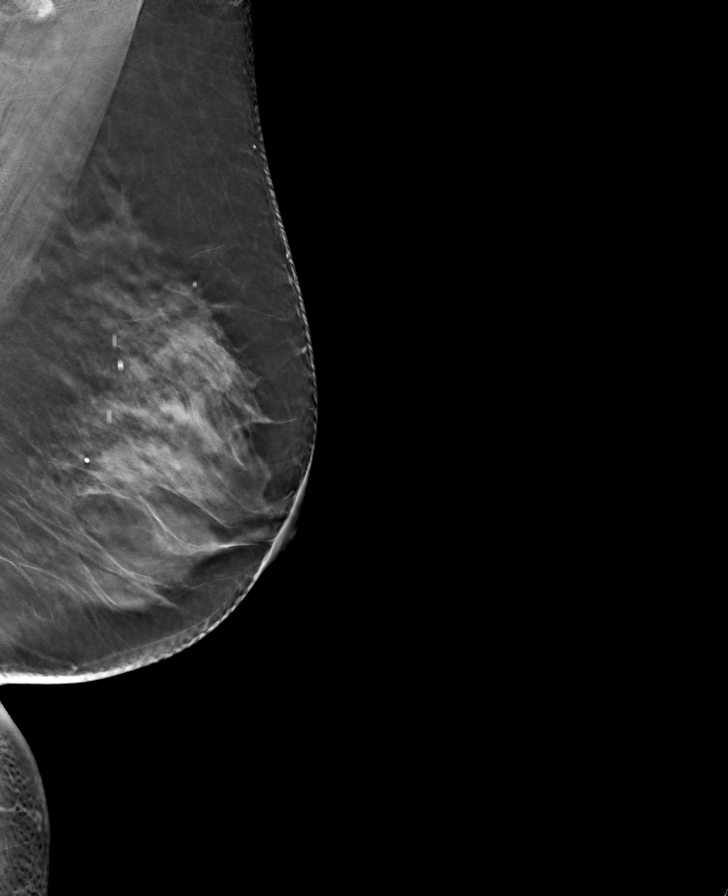

[R CC tomo · tomo slice 35/69.0]
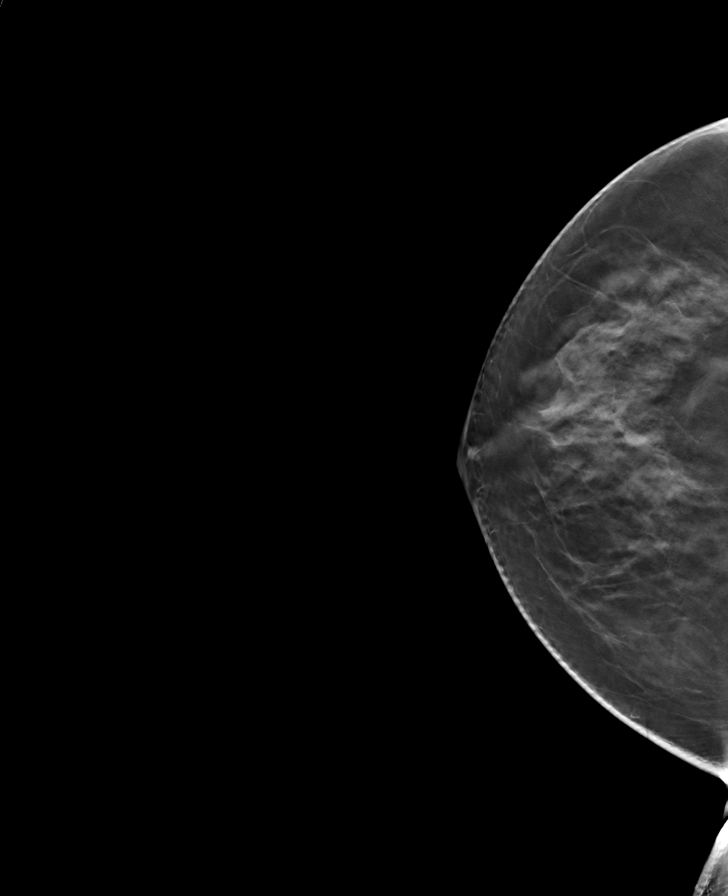

[R MLO tomo · tomo slice 38/75.0]
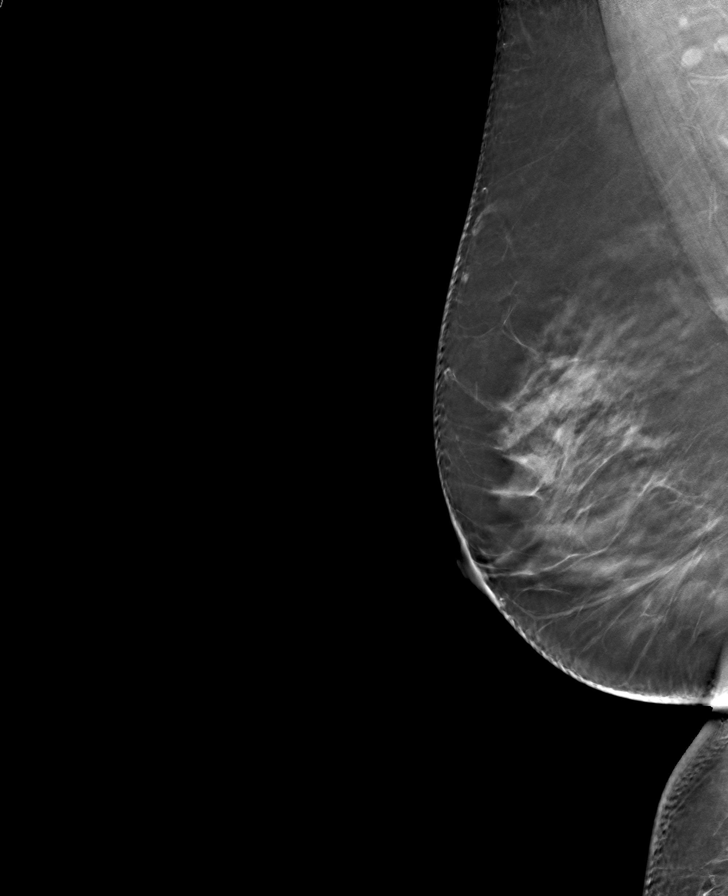

[8 of 24 positions shown; findings below may reference images not displayed]

ACR Breast Density Category c: The breast tissue is heterogeneously
dense, which may obscure small masses.
FINDINGS: There are no findings suspicious for malignancy. Images were
processed with CAD.
IMPRESSION: No mammographic evidence of malignancy. A result letter of this
screening mammogram will be mailed directly to the patient.

RECOMMENDATION:
Screening mammogram in one year. (Code:[id number])

BI-RADS CATEGORY  1: Negative.

## 2019-11-27 DIAGNOSIS — H60339 Swimmer's ear, unspecified ear: Secondary | ICD-10-CM | POA: Diagnosis not present

## 2019-11-27 DIAGNOSIS — H6122 Impacted cerumen, left ear: Secondary | ICD-10-CM | POA: Diagnosis not present

## 2019-11-30 LAB — TOXASSURE SELECT 13 (MW), URINE

## 2019-12-07 ENCOUNTER — Telehealth: Payer: Self-pay

## 2019-12-07 ENCOUNTER — Encounter: Payer: Self-pay | Admitting: Internal Medicine

## 2019-12-07 ENCOUNTER — Ambulatory Visit (INDEPENDENT_AMBULATORY_CARE_PROVIDER_SITE_OTHER): Payer: Medicare Other | Admitting: Internal Medicine

## 2019-12-07 VITALS — Temp 98.0°F | Ht 68.0 in | Wt 191.0 lb

## 2019-12-07 DIAGNOSIS — J0100 Acute maxillary sinusitis, unspecified: Secondary | ICD-10-CM

## 2019-12-07 MED ORDER — AZITHROMYCIN 250 MG PO TABS: ORAL_TABLET | ORAL | 0 refills | Status: AC

## 2019-12-07 NOTE — Progress Notes (Signed)
Date:  12/07/2019   Name:  Carrie Myers   DOB:  [birth year]   MRN:  [id number]  I connected with this patient, Carrie Myers, by telephone at the patient's home.  Pt does not have the technology to conduct a video visit. I verified that I am speaking with the correct person using two identifiers. This visit was conducted via telephone due to the Covid-19 outbreak from my office at [facility] in [place], [place].  Chief Complaint: Sinusitis (Sinus headache , blowing out green mucous , cough - face pressure . No fever. No SOB. )  Sinusitis This is a new problem. The current episode started yesterday. There has been no fever. The pain is mild. Associated symptoms include congestion (green nasal discharge), coughing, sinus pressure and a sore throat. Pertinent negatives include no chills, headaches or shortness of breath.   Immunization History  Administered Date(s) Administered  . Fluad Quad(high Dose 65+) 03/28/2019  . Influenza, High Dose Seasonal PF 12/31/2016, 12/05/2017  . Moderna SARS-COVID-2 Vaccination 11/09/2019  . Pneumococcal Conjugate-13 01/30/2014  . Pneumococcal Polysaccharide-23 09/22/2012  . Tdap 09/08/2011    Lab Results  Component Value Date   CREATININE 0.98 05/09/2019   BUN 16 05/09/2019   NA 133 (L) 05/09/2019   K 3.9 05/09/2019   CL 100 05/09/2019   CO2 20 (L) 05/09/2019   Lab Results  Component Value Date   CHOL 111 03/28/2019   HDL 43 03/28/2019   LDLCALC 47 03/28/2019   TRIG 119 03/28/2019   CHOLHDL 2.6 03/28/2019   Lab Results  Component Value Date   TSH 2.260 03/28/2019   No results found for: HGBA1C Lab Results  Component Value Date   WBC 10.2 05/09/2019   HGB 11.6 (L) 05/09/2019   HCT 34.6 (L) 05/09/2019   MCV 91.3 05/09/2019   PLT 231 05/09/2019   Lab Results  Component Value Date   ALT 31 03/28/2019   AST 30 03/28/2019   ALKPHOS 78 03/28/2019   BILITOT 0.3 03/28/2019     Review of Systems  Constitutional:  Negative for chills, fatigue and fever.  HENT: Positive for congestion (green nasal discharge), sinus pressure, sinus pain and sore throat.        No loss of taste or smell  Respiratory: Positive for cough. Negative for chest tightness, shortness of breath and wheezing.   Cardiovascular: Negative for chest pain and palpitations.  Musculoskeletal: Negative for myalgias.  Allergic/Immunologic: Positive for environmental allergies.  Neurological: Negative for dizziness, light-headedness and headaches.  Psychiatric/Behavioral: Negative for sleep disturbance.    Patient Active Problem List   Diagnosis Date Noted  . Slow transit constipation 06/05/2019  . Sciatica, left side 01/01/2019  . Diverticulosis of colon 04/25/2018  . Change in bowel habits   . Tobacco use disorder, moderate, in sustained remission 12/22/2017  . Moderate episode of recurrent major depressive disorder ([place]) 10/03/2017  . Inflammatory spondylopathy of lumbar region ([place]) 10/03/2017  . Hoarseness, persistent 04/18/2017  . OSA on CPAP 02/18/2017  . Cervical disc disease 12/31/2016  . Generalized osteoarthritis 12/23/2016  . Chronic pain syndrome 12/23/2016  . Cystocele, midline 12/06/2016  . Urinary incontinence 12/06/2016  . Impingement syndrome of right shoulder 04/17/2015  . Fibrocystic breast 01/18/2015  . Gastroesophageal reflux disease without esophagitis 01/18/2015  . Hypothyroidism, postablative 01/18/2015  . Arteriosclerosis of coronary artery 12/29/2014  . DJD of shoulder 11/13/2014  . Fibromyalgia 10/02/2014  . Intercostal neuralgia 10/02/2014  . Facet syndrome, lumbar 10/02/2014  .  Sacroiliac joint disease 10/02/2014  . Carotid artery narrowing 07/09/2014  . Benign essential HTN 07/01/2014  . Osteoarthritis of thumb 09/24/2012  . Mixed hyperlipidemia 09/22/2012  . Arthritis of hand, degenerative 09/22/2012    Allergies  Allergen Reactions  . Augmentin [Amoxicillin-Pot Clavulanate] Nausea And  Vomiting  . Codeine Nausea And Vomiting  . Sulfa Antibiotics Nausea And Vomiting  . Proparacaine Itching  . Shellfish Allergy Nausea And Vomiting    No issues with iodine     Past Surgical History:  Procedure Laterality Date  . ABDOMINAL HYSTERECTOMY     total  . APPENDECTOMY    . BREAST BIOPSY Right    neg  . BREAST CYST ASPIRATION Left    neg  . BUNIONECTOMY Bilateral   . CESAREAN SECTION    . COLONOSCOPY  2010   normal  . COLONOSCOPY WITH PROPOFOL N/A 04/03/2018   Procedure: COLONOSCOPY WITH BIOPSIES;  Surgeon: Lucilla Lame, MD;  Location: [place];  Service: Endoscopy;  Laterality: N/A;  sleep apnea  . CORONARY ANGIOPLASTY WITH STENT PLACEMENT  04/2006  . dental implant     [age]  . epidural steroid injection  2018  . SHOULDER ARTHROSCOPY Right 05/07/2015   Procedure: Extensive arthroscopic debridement and arthroscopic subacromial decompression, right shoulder.  2. Steroid injection right thumb CMC joint.;  Surgeon: Corky Mull, MD;  Location: [place];  Service: Orthopedics;  Laterality: Right;  . TOOTH EXTRACTION     Upper centrals and lateral    Social History   Tobacco Use  . Smoking status: Former Smoker    Packs/day: 1.25    Years: 44.00    Pack years: 55.00    Types: Cigarettes    Quit date: 04/22/2006    Years since quitting: 13.6  . Smokeless tobacco: Never Used  . Tobacco comment:    Vaping Use  . Vaping Use: Never used  Substance Use Topics  . Alcohol use: No    Alcohol/week: 0.0 standard drinks  . Drug use: No     Medication list has been reviewed and updated.  Current Meds  Medication Sig  . aspirin EC 81 MG tablet Take 81 mg by mouth daily.  . citalopram (CELEXA) 20 MG tablet TAKE 1 TABLET BY MOUTH  DAILY  . conjugated estrogens (PREMARIN) vaginal cream Apply a green pea amount to the urethral opening nightly  . cyclobenzaprine (FLEXERIL) 10 MG tablet TAKE 1 TABLET BY MOUTH 3  TIMES DAILY  .  Diclofenac-miSOPROStol 75-0.2 MG TBEC TAKE 1 TABLET BY MOUTH TWO  TIMES DAILY  . DULoxetine (CYMBALTA) 60 MG capsule TAKE 1 CAPSULE(60 MG) BY MOUTH DAILY  . esomeprazole (NEXIUM) 20 MG capsule Take 40 mg by mouth daily. Over the Counter  . HYDROcodone-acetaminophen (NORCO/VICODIN) 5-325 MG tablet Take 1 tablet by mouth 2 (two) times daily as needed for moderate pain. Limit 1-2 tablets by mouth per day if tolerated  . [START ON 12/08/2019] HYDROcodone-acetaminophen (NORCO/VICODIN) 5-325 MG tablet Take 1 tablet by mouth 2 (two) times daily as needed for moderate pain.  Marland Kitchen levothyroxine (SYNTHROID) 88 MCG tablet TAKE 1 TABLET BY MOUTH  DAILY  . Loperamide HCl (IMODIUM PO) Take by mouth as needed.  Marland Kitchen losartan (COZAAR) 50 MG tablet Take 50 mg by mouth daily.  . mirabegron ER (MYRBETRIQ) 25 MG TB24 tablet Take 1 tablet (25 mg total) by mouth daily.  . Multiple Vitamin (MULTI-VITAMINS) TABS Take by mouth.  . NON FORMULARY   . pravastatin (PRAVACHOL) 80 MG  tablet Take 80 mg by mouth daily.  Marland Kitchen tolterodine (DETROL LA) 4 MG 24 hr capsule TAKE 1 CAPSULE(4 MG) BY MOUTH DAILY  . [START ON 12/13/2019] traMADol (ULTRAM) 50 MG tablet Take 1 tablet (50 mg total) by mouth 3 (three) times daily.    PHQ 2/9 Scores 12/07/2019 07/18/2019 06/05/2019 05/07/2019  PHQ - 2 Score 0 0 0 0  PHQ- 9 Score 0 0 0 -  Exception Documentation - - - -    GAD 7 : Generalized Anxiety Score 12/07/2019 06/05/2019  Nervous, Anxious, on Edge 0 0  Control/stop worrying 0 0  Worry too much - different things 0 0  Trouble relaxing 0 0  Restless 0 0  Easily annoyed or irritable 0 0  Afraid - awful might happen 0 0  Total GAD 7 Score 0 0  Anxiety Difficulty Not difficult at all Not difficult at all    BP Readings from Last 3 Encounters:  07/18/19 136/78  06/20/19 130/80  06/05/19 130/80    Physical Exam HENT:     Nose:     Right Sinus: No maxillary sinus tenderness or frontal sinus tenderness.     Left Sinus: No maxillary sinus  tenderness or frontal sinus tenderness.  Pulmonary:     Comments: No cough or dyspnea noted during the call Neurological:     Mental Status: She is alert.  Psychiatric:        Attention and Perception: Attention normal.        Mood and Affect: Mood normal.        Speech: Speech normal.        Cognition and Memory: Cognition normal.     Wt Readings from Last 3 Encounters:  12/07/19 191 lb (86.6 kg)  07/18/19 191 lb (86.6 kg)  06/20/19 190 lb (86.2 kg)    Temp 98 F (36.7 C) (Oral)   Ht 5\' 8"  (1.727 m)   Wt 191 lb (86.6 kg)   BMI 29.04 kg/m   Assessment and Plan: 1. Acute non-recurrent maxillary sinusitis Continue anti-histamines and over the counter cough suppressants Increase fluids Covid testing information given but suspicion is low Get second Covid vaccine next week as planned - azithromycin (ZITHROMAX Z-PAK) 250 MG tablet; UAD  Dispense: 6 each; Refill: 0   Partially dictated using Editor, commissioning. Any errors are unintentional.  Halina Maidens, MD [facility]  12/07/2019  I spent 12 minutes on this encounter. This encounter was conducted via telephone encounter due to the need for social distancing in light of the Covid-19 pandemic.  The patient was correctly identified.  I advised that I am conducting the visit from a secure room in my office at [facility] clinic.   The limitations of this form of encounter were discussed with the patient and he/she agreed to proceed.  Some vital signs will be absent.

## 2019-12-07 NOTE — Telephone Encounter (Signed)
Patient called the [facility] stating she needs a covid test. Called the patient back and she said she is having cough, sore throat, runny nose, headache. No fever, sob, or loss of taste or smell.   She said she just wants to be sure she does not have covid before she travels to see her friend who is slowly passing away. She had her first moderna vaccine and is due for her 2nd in a few days at [facility].  Scheduled her for 4 PM and told her to be ready by the phone with her tempeture at 330 PM.   She verbalized understanding.   CM

## 2019-12-13 DIAGNOSIS — Z23 Encounter for immunization: Secondary | ICD-10-CM | POA: Diagnosis not present

## 2019-12-22 ENCOUNTER — Other Ambulatory Visit: Payer: Self-pay | Admitting: Anesthesiology

## 2020-01-02 ENCOUNTER — Ambulatory Visit
Admission: RE | Admit: 2020-01-02 | Discharge: 2020-01-02 | Disposition: A | Discharge status: Home / Self Care | Payer: Medicare Other | Source: Ambulatory Visit | Attending: Anesthesiology | Admitting: Anesthesiology

## 2020-01-02 ENCOUNTER — Other Ambulatory Visit: Payer: Self-pay

## 2020-01-02 ENCOUNTER — Ambulatory Visit (HOSPITAL_BASED_OUTPATIENT_CLINIC_OR_DEPARTMENT_OTHER): Payer: Medicare Other | Admitting: Anesthesiology

## 2020-01-02 ENCOUNTER — Other Ambulatory Visit: Payer: Self-pay | Admitting: Anesthesiology

## 2020-01-02 ENCOUNTER — Encounter: Payer: Self-pay | Admitting: Anesthesiology

## 2020-01-02 VITALS — BP 157/88 | HR 83 | Temp 98.1°F | Resp 18 | Ht 68.0 in | Wt 190.0 lb

## 2020-01-02 DIAGNOSIS — G894 Chronic pain syndrome: Secondary | ICD-10-CM

## 2020-01-02 DIAGNOSIS — M5441 Lumbago with sciatica, right side: Secondary | ICD-10-CM | POA: Insufficient documentation

## 2020-01-02 DIAGNOSIS — M5136 Other intervertebral disc degeneration, lumbar region: Secondary | ICD-10-CM | POA: Insufficient documentation

## 2020-01-02 DIAGNOSIS — M797 Fibromyalgia: Secondary | ICD-10-CM | POA: Insufficient documentation

## 2020-01-02 DIAGNOSIS — G8929 Other chronic pain: Secondary | ICD-10-CM | POA: Diagnosis not present

## 2020-01-02 DIAGNOSIS — M5442 Lumbago with sciatica, left side: Secondary | ICD-10-CM

## 2020-01-02 DIAGNOSIS — M5432 Sciatica, left side: Secondary | ICD-10-CM | POA: Insufficient documentation

## 2020-01-02 DIAGNOSIS — R52 Pain, unspecified: Secondary | ICD-10-CM | POA: Diagnosis present

## 2020-01-02 DIAGNOSIS — F1190 Opioid use, unspecified, uncomplicated: Secondary | ICD-10-CM | POA: Diagnosis present

## 2020-01-02 MED ORDER — TRIAMCINOLONE ACETONIDE 40 MG/ML IJ SUSP
40.0000 mg | Freq: Once | INTRAMUSCULAR | Status: AC
  Administered 2020-01-02: 40 mg

## 2020-01-02 MED ORDER — SODIUM CHLORIDE (PF) 0.9 % IJ SOLN
INTRAMUSCULAR | Status: AC
  Filled 2020-01-02: qty 10

## 2020-01-02 MED ORDER — LIDOCAINE HCL (PF) 1 % IJ SOLN
INTRAMUSCULAR | Status: AC
  Filled 2020-01-02: qty 5

## 2020-01-02 MED ORDER — HYDROCODONE-ACETAMINOPHEN 5-325 MG PO TABS
1.0000 | ORAL_TABLET | Freq: Two times a day (BID) | ORAL | 0 refills | Status: DC | PRN
Start: 2020-01-02 — End: 2020-01-31

## 2020-01-02 MED ORDER — FENTANYL CITRATE (PF) 100 MCG/2ML IJ SOLN
INTRAMUSCULAR | Status: AC
  Filled 2020-01-02: qty 2

## 2020-01-02 MED ORDER — TRIAMCINOLONE ACETONIDE 40 MG/ML IJ SUSP
INTRAMUSCULAR | Status: AC
  Filled 2020-01-02: qty 1

## 2020-01-02 MED ORDER — ROPIVACAINE HCL 2 MG/ML IJ SOLN
10.0000 mL | Freq: Once | INTRAMUSCULAR | Status: AC
  Administered 2020-01-02: 1 mL via EPIDURAL

## 2020-01-02 MED ORDER — SODIUM CHLORIDE 0.9% FLUSH
10.0000 mL | Freq: Once | INTRAVENOUS | Status: AC
  Administered 2020-01-02: 5 mL

## 2020-01-02 MED ORDER — LACTATED RINGERS IV SOLN
1000.0000 mL | INTRAVENOUS | Status: DC
  Administered 2020-01-02: 1000 mL via INTRAVENOUS

## 2020-01-02 MED ORDER — ROPIVACAINE HCL 2 MG/ML IJ SOLN
INTRAMUSCULAR | Status: AC
  Filled 2020-01-02: qty 10

## 2020-01-02 MED ORDER — MIDAZOLAM HCL 2 MG/2ML IJ SOLN
5.0000 mg | Freq: Once | INTRAMUSCULAR | Status: AC
  Administered 2020-01-02: 2 mg via INTRAVENOUS
  Filled 2020-01-02: qty 5

## 2020-01-02 MED ORDER — LIDOCAINE HCL (PF) 1 % IJ SOLN
5.0000 mL | Freq: Once | INTRAMUSCULAR | Status: AC
  Administered 2020-01-02: 5 mL via SUBCUTANEOUS

## 2020-01-02 MED ORDER — IOHEXOL 180 MG/ML  SOLN
INTRAMUSCULAR | Status: AC
  Filled 2020-01-02: qty 20

## 2020-01-02 MED ORDER — DIAZEPAM 5 MG PO TABS: 5.0000 mg | ORAL_TABLET | Freq: Every day | ORAL | 3 refills | Status: AC

## 2020-01-02 MED ORDER — IOHEXOL 180 MG/ML  SOLN
10.0000 mL | Freq: Once | INTRAMUSCULAR | Status: AC | PRN
  Administered 2020-01-02: 10 mL via EPIDURAL

## 2020-01-02 MED ORDER — MIDAZOLAM HCL 5 MG/5ML IJ SOLN
INTRAMUSCULAR | Status: AC
  Filled 2020-01-02: qty 5

## 2020-01-02 NOTE — Progress Notes (Signed)
Safety precautions to be maintained throughout the outpatient stay will include: orient to surroundings, keep bed in low position, maintain call bell within reach at all times, provide assistance with transfer out of bed and ambulation.  

## 2020-01-02 NOTE — Patient Instructions (Signed)
Pain Management Discharge Instructions  General Discharge Instructions :  If you need to reach your doctor call: Monday-Friday 8:00 am - 4:00 pm at [phone] or toll free [phone].  After clinic hours [phone] to have operator reach doctor.  Bring all of your medication bottles to all your appointments in the pain clinic.  To cancel or reschedule your appointment with Pain Management please remember to call 24 hours in advance to avoid a fee.  Refer to the educational materials which you have been given on: General Risks, I had my Procedure. Discharge Instructions, Post Sedation.  Post Procedure Instructions:  The drugs you were given will stay in your system until tomorrow, so for the next 24 hours you should not drive, make any legal decisions or drink any alcoholic beverages.  You may eat anything you prefer, but it is better to start with liquids then soups and crackers, and gradually work up to solid foods.  Please notify your doctor immediately if you have any unusual bleeding, trouble breathing or pain that is not related to your normal pain.  Depending on the type of procedure that was done, some parts of your body may feel week and/or numb.  This usually clears up by tonight or the next day.  Walk with the use of an assistive device or accompanied by an adult for the 24 hours.  You may use ice on the affected area for the first 24 hours.  Put ice in a Ziploc bag and cover with a towel and place against area 15 minutes on 15 minutes off.  You may switch to heat after 24 hours.Epidural Steroid Injection Patient Information  Description: The epidural space surrounds the nerves as they exit the spinal cord.  In some patients, the nerves can be compressed and inflamed by a bulging disc or a tight spinal canal (spinal stenosis).  By injecting steroids into the epidural space, we can bring irritated nerves into direct contact with a potentially helpful medication.  These  steroids act directly on the irritated nerves and can reduce swelling and inflammation which often leads to decreased pain.  Epidural steroids may be injected anywhere along the spine and from the neck to the low back depending upon the location of your pain.   After numbing the skin with local anesthetic (like Novocaine), a small needle is passed into the epidural space slowly.  You may experience a sensation of pressure while this is being done.  The entire block usually last less than 10 minutes.  Conditions which may be treated by epidural steroids:   Low back and leg pain  Neck and arm pain  Spinal stenosis  Post-laminectomy syndrome  Herpes zoster (shingles) pain  Pain from compression fractures  Preparation for the injection:  1. Do not eat any solid food or dairy products within 8 hours of your appointment.  2. You may drink clear liquids up to 3 hours before appointment.  Clear liquids include water, black coffee, juice or soda.  No milk or cream please. 3. You may take your regular medication, including pain medications, with a sip of water before your appointment  Diabetics should hold regular insulin (if taken separately) and take 1/2 normal NPH dos the morning of the procedure.  Carry some sugar containing items with you to your appointment. 4. A driver must accompany you and be prepared to drive you home after your procedure.  5. Bring all your current medications with your. 6. An IV may be inserted and   sedation may be given at the discretion of the physician.   7. A blood pressure cuff, EKG and other monitors will often be applied during the procedure.  Some patients may need to have extra oxygen administered for a short period. 8. You will be asked to provide medical information, including your allergies, prior to the procedure.  We must know immediately if you are taking blood thinners (like Coumadin/Warfarin)  Or if you are allergic to IV iodine contrast (dye). We must  know if you could possible be pregnant.  Possible side-effects:  Bleeding from needle site  Infection (rare, may require surgery)  Nerve injury (rare)  Numbness & tingling (temporary)  Difficulty urinating (rare, temporary)  Spinal headache ( a headache worse with upright posture)  Light -headedness (temporary)  Pain at injection site (several days)  Decreased blood pressure (temporary)  Weakness in arm/leg (temporary)  Pressure sensation in back/neck (temporary)  Call if you experience:  Fever/chills associated with headache or increased back/neck pain.  Headache worsened by an upright position.  New onset weakness or numbness of an extremity below the injection site  Hives or difficulty breathing (go to the emergency room)  Inflammation or drainage at the infection site  Severe back/neck pain  Any new symptoms which are concerning to you  Please note:  Although the local anesthetic injected can often make your back or neck feel good for several hours after the injection, the pain will likely return.  It takes 3-7 days for steroids to work in the epidural space.  You may not notice any pain relief for at least that one week.  If effective, we will often do a series of three injections spaced 3-6 weeks apart to maximally decrease your pain.  After the initial series, we generally will wait several months before considering a repeat injection of the same type.  If you have any questions, please call [phone] [facility] 

## 2020-01-03 ENCOUNTER — Telehealth: Payer: Self-pay | Admitting: *Deleted

## 2020-01-03 NOTE — Telephone Encounter (Signed)
No problems post procedure. 

## 2020-01-03 NOTE — Progress Notes (Signed)
Subjective:  Patient ID: Carrie Myers, female    DOB: 11/24/1945  Age: 74 y.o. MRN: [id number]  CC: Hip Pain (left)   Procedure: L5-S1 epidural steroid under fluoroscopic guidance with moderate sedation  HPI Carrie Myers presents for reevaluation.  She continues to have complaints of low back pain with radiation into the lower extremities.  This bothers her primarily on the left side affecting the calf and foot.  She has associated numbness and tingling and the pain is worse with prolonged standing and certain activities.  In the past she has had epidural steroids for treatment and these have worked well for her.  She generally reports 75 to 85% relief following the injection in her last steroid was over a year ago.  She continues efforts at weight loss stretching strengthening as prescribed.  She is taking her Valium once a day for muscle spasms at night and using sparing doses of hydrocodone or tramadol to assist with pain relief.  Otherwise she reports that she is doing reasonably well with no change in lower extremity strength or function or bowel or bladder function.  Outpatient Medications Prior to Visit  Medication Sig Dispense Refill  . aspirin EC 81 MG tablet Take 81 mg by mouth daily.    . citalopram (CELEXA) 20 MG tablet TAKE 1 TABLET BY MOUTH  DAILY 90 tablet 3  . conjugated estrogens (PREMARIN) vaginal cream Apply a green pea amount to the urethral opening nightly 42.5 g 1  . Diclofenac-miSOPROStol 75-0.2 MG TBEC TAKE 1 TABLET BY MOUTH TWO  TIMES DAILY 180 tablet 3  . DULoxetine (CYMBALTA) 60 MG capsule TAKE 1 CAPSULE(60 MG) BY MOUTH DAILY 30 capsule 0  . esomeprazole (NEXIUM) 20 MG capsule Take 40 mg by mouth daily. Over the Counter    . levothyroxine (SYNTHROID) 88 MCG tablet TAKE 1 TABLET BY MOUTH  DAILY 90 tablet 3  . Loperamide HCl (IMODIUM PO) Take by mouth as needed.    Marland Kitchen losartan (COZAAR) 50 MG tablet Take 50 mg by mouth daily.    . mirabegron ER (MYRBETRIQ)  25 MG TB24 tablet Take 1 tablet (25 mg total) by mouth daily. 30 tablet 7  . Multiple Vitamin (MULTI-VITAMINS) TABS Take by mouth.    . NON FORMULARY     . pravastatin (PRAVACHOL) 80 MG tablet Take 80 mg by mouth daily.    Marland Kitchen tolterodine (DETROL LA) 4 MG 24 hr capsule TAKE 1 CAPSULE(4 MG) BY MOUTH DAILY 90 capsule 3  . traMADol (ULTRAM) 50 MG tablet Take 1 tablet (50 mg total) by mouth 3 (three) times daily. 90 tablet 1  . HYDROcodone-acetaminophen (NORCO/VICODIN) 5-325 MG tablet Take 1 tablet by mouth 2 (two) times daily as needed for moderate pain. 45 tablet 0  . cyclobenzaprine (FLEXERIL) 10 MG tablet TAKE 1 TABLET BY MOUTH 3  TIMES DAILY (Patient not taking: Reported on 01/02/2020) 270 tablet 0  . HYDROcodone-acetaminophen (NORCO/VICODIN) 5-325 MG tablet Take 1 tablet by mouth 2 (two) times daily as needed for moderate pain. Limit 1-2 tablets by mouth per day if tolerated 45 tablet 0  . diazepam (VALIUM) 5 MG tablet Take 5 mg by mouth at bedtime.     No facility-administered medications prior to visit.    Review of Systems CNS: No confusion or sedation Cardiac: No angina or palpitations GI: No abdominal pain or constipation Constitutional: No nausea vomiting fevers or chills  Objective:  BP (!) 157/88   Pulse 83   Temp 98.1  F (36.7 C) (Temporal)   Resp 18   Ht 5\' 8"  (1.727 m)   Wt 190 lb (86.2 kg)   SpO2 100%   BMI 28.89 kg/m    BP Readings from Last 3 Encounters:  01/02/20 (!) 157/88  07/18/19 136/78  06/20/19 130/80     Wt Readings from Last 3 Encounters:  01/02/20 190 lb (86.2 kg)  12/07/19 191 lb (86.6 kg)  07/18/19 191 lb (86.6 kg)     Physical Exam Pt is alert and oriented PERRL EOMI HEART IS RRR no murmur or rub LCTA no wheezing or rales MUSCULOSKELETAL reveals some paraspinous muscle tenderness but no overt trigger points.  She is ambulating with a antalgic gait.  She has a positive straight leg raise on the left side negative on the  right.  Labs  No results found for: HGBA1C Lab Results  Component Value Date   LDLCALC 47 03/28/2019   CREATININE 0.98 05/09/2019    -------------------------------------------------------------------------------------------------------------------- Lab Results  Component Value Date   WBC 10.2 05/09/2019   HGB 11.6 (L) 05/09/2019   HCT 34.6 (L) 05/09/2019   PLT 231 05/09/2019   GLUCOSE 117 (H) 05/09/2019   CHOL 111 03/28/2019   TRIG 119 03/28/2019   HDL 43 03/28/2019   LDLCALC 47 03/28/2019   ALT 31 03/28/2019   AST 30 03/28/2019   NA 133 (L) 05/09/2019   K 3.9 05/09/2019   CL 100 05/09/2019   CREATININE 0.98 05/09/2019   BUN 16 05/09/2019   CO2 20 (L) 05/09/2019   TSH 2.260 03/28/2019    --------------------------------------------------------------------------------------------------------------------- DG PAIN CLINIC C-ARM 1-60 MIN NO REPORT  Result Date: 01/02/2020 Fluoro was used, but no Radiologist interpretation will be provided. Please refer to "NOTES" tab for provider progress note.    Assessment & Plan:   Carrie Myers was seen today for hip pain.  Diagnoses and all orders for this visit:  Chronic bilateral low back pain with bilateral sciatica -     Lumbar Epidural Injection; Future  DDD (degenerative disc disease), lumbar -     Lumbar Epidural Injection; Future  Chronic pain syndrome  Fibromyalgia  Chronic, continuous use of opioids  Sciatica, left side -     Lumbar Epidural Injection; Future  Other orders -     triamcinolone acetonide (KENALOG-40) injection 40 mg -     sodium chloride flush (NS) 0.9 % injection 10 mL -     ropivacaine (PF) 2 mg/mL (0.2%) (NAROPIN) injection 10 mL -     midazolam (VERSED) injection 5 mg -     lidocaine (PF) (XYLOCAINE) 1 % injection 5 mL -     lactated ringers infusion 1,000 mL -     iohexol (OMNIPAQUE) 180 MG/ML injection 10 mL -     diazepam (VALIUM) 5 MG tablet; Take 1 tablet (5 mg total) by mouth at  bedtime. -     HYDROcodone-acetaminophen (NORCO/VICODIN) 5-325 MG tablet; Take 1 tablet by mouth 2 (two) times daily as needed for moderate pain.        ----------------------------------------------------------------------------------------------------------------------  Problem List Items Addressed This Visit      Unprioritized   Chronic pain syndrome (Chronic)   Relevant Medications   HYDROcodone-acetaminophen (NORCO/VICODIN) 5-325 MG tablet   Fibromyalgia   Relevant Medications   HYDROcodone-acetaminophen (NORCO/VICODIN) 5-325 MG tablet   Sciatica, left side   Relevant Medications   diazepam (VALIUM) 5 MG tablet (Start on 01/22/2020)   Other Relevant Orders   Lumbar Epidural Injection  Other Visit Diagnoses    Chronic bilateral low back pain with bilateral sciatica    -  Primary   Relevant Medications   triamcinolone acetonide (KENALOG-40) injection 40 mg (Completed)   midazolam (VERSED) injection 5 mg (Completed)   diazepam (VALIUM) 5 MG tablet (Start on 01/22/2020)   HYDROcodone-acetaminophen (NORCO/VICODIN) 5-325 MG tablet   Other Relevant Orders   Lumbar Epidural Injection   DDD (degenerative disc disease), lumbar       Relevant Medications   triamcinolone acetonide (KENALOG-40) injection 40 mg (Completed)   HYDROcodone-acetaminophen (NORCO/VICODIN) 5-325 MG tablet   Other Relevant Orders   Lumbar Epidural Injection   Chronic, continuous use of opioids            ----------------------------------------------------------------------------------------------------------------------  1. Chronic bilateral low back pain with bilateral sciatica Based on her current findings we will proceed with a repeat epidural today.  We have gone over the risks of benefits of the procedure with her in full detail and all questions are answered.  We will have her return to clinic approximately 6 weeks with consideration for repeat epidural at that time.  I encouraged her to  continue with stretching strengthening exercises.  In the meantime we will discontinue tramadol secondary to her SSRIs.  We will have her continue with sparing use of hydrocodone and Valium nightly.  This combination seems to be working well for her. - Lumbar Epidural Injection; Future  2. DDD (degenerative disc disease), lumbar As above - Lumbar Epidural Injection; Future  3. Chronic pain syndrome I have reviewed the [facility] practitioner database information and it is appropriate.  Refills will be given today for October 20.  4. Fibromyalgia As above continue with core stretching strengthening and ambulation  5. Chronic, continuous use of opioids As above  6. Sciatica, left side As above - Lumbar Epidural Injection; Future    ----------------------------------------------------------------------------------------------------------------------  I have changed Carrie Myers. Thieme's diazepam. I am also having her maintain her esomeprazole, losartan, pravastatin, aspirin EC, Multi-Vitamins, NON FORMULARY, cyclobenzaprine, Loperamide HCl (IMODIUM PO), citalopram, Diclofenac-miSOPROStol, levothyroxine, tolterodine, conjugated estrogens, mirabegron ER, DULoxetine, HYDROcodone-acetaminophen, traMADol, and HYDROcodone-acetaminophen. We administered triamcinolone acetonide, sodium chloride flush, ropivacaine (PF) 2 mg/mL (0.2%), midazolam, lidocaine (PF), lactated ringers, and iohexol.   Meds ordered this encounter  Medications  . triamcinolone acetonide (KENALOG-40) injection 40 mg  . sodium chloride flush (NS) 0.9 % injection 10 mL  . ropivacaine (PF) 2 mg/mL (0.2%) (NAROPIN) injection 10 mL  . midazolam (VERSED) injection 5 mg  . lidocaine (PF) (XYLOCAINE) 1 % injection 5 mL  . lactated ringers infusion 1,000 mL  . iohexol (OMNIPAQUE) 180 MG/ML injection 10 mL  . diazepam (VALIUM) 5 MG tablet    Sig: Take 1 tablet (5 mg total) by mouth at bedtime.    Dispense:  30 tablet     Refill:  3  . HYDROcodone-acetaminophen (NORCO/VICODIN) 5-325 MG tablet    Sig: Take 1 tablet by mouth 2 (two) times daily as needed for moderate pain.    Dispense:  45 tablet    Refill:  0    To be filled once per month..30 day supply   Patient's Medications  New Prescriptions   No medications on file  Previous Medications   ASPIRIN EC 81 MG TABLET    Take 81 mg by mouth daily.   CITALOPRAM (CELEXA) 20 MG TABLET    TAKE 1 TABLET BY MOUTH  DAILY   CONJUGATED ESTROGENS (PREMARIN) VAGINAL CREAM    Apply a green pea  amount to the urethral opening nightly   CYCLOBENZAPRINE (FLEXERIL) 10 MG TABLET    TAKE 1 TABLET BY MOUTH 3  TIMES DAILY   DICLOFENAC-MISOPROSTOL 75-0.2 MG TBEC    TAKE 1 TABLET BY MOUTH TWO  TIMES DAILY   DULOXETINE (CYMBALTA) 60 MG CAPSULE    TAKE 1 CAPSULE(60 MG) BY MOUTH DAILY   ESOMEPRAZOLE (NEXIUM) 20 MG CAPSULE    Take 40 mg by mouth daily. Over the Counter   HYDROCODONE-ACETAMINOPHEN (NORCO/VICODIN) 5-325 MG TABLET    Take 1 tablet by mouth 2 (two) times daily as needed for moderate pain. Limit 1-2 tablets by mouth per day if tolerated   LEVOTHYROXINE (SYNTHROID) 88 MCG TABLET    TAKE 1 TABLET BY MOUTH  DAILY   LOPERAMIDE HCL (IMODIUM PO)    Take by mouth as needed.   LOSARTAN (COZAAR) 50 MG TABLET    Take 50 mg by mouth daily.   MIRABEGRON ER (MYRBETRIQ) 25 MG TB24 TABLET    Take 1 tablet (25 mg total) by mouth daily.   MULTIPLE VITAMIN (MULTI-VITAMINS) TABS    Take by mouth.   NON FORMULARY       PRAVASTATIN (PRAVACHOL) 80 MG TABLET    Take 80 mg by mouth daily.   TOLTERODINE (DETROL LA) 4 MG 24 HR CAPSULE    TAKE 1 CAPSULE(4 MG) BY MOUTH DAILY   TRAMADOL (ULTRAM) 50 MG TABLET    Take 1 tablet (50 mg total) by mouth 3 (three) times daily.  Modified Medications   Modified Medication Previous Medication   DIAZEPAM (VALIUM) 5 MG TABLET diazepam (VALIUM) 5 MG tablet      Take 1 tablet (5 mg total) by mouth at bedtime.    Take 5 mg by mouth at bedtime.    HYDROCODONE-ACETAMINOPHEN (NORCO/VICODIN) 5-325 MG TABLET HYDROcodone-acetaminophen (NORCO/VICODIN) 5-325 MG tablet      Take 1 tablet by mouth 2 (two) times daily as needed for moderate pain.    Take 1 tablet by mouth 2 (two) times daily as needed for moderate pain.  Discontinued Medications   No medications on file   ----------------------------------------------------------------------------------------------------------------------  Follow-up: Return in about 6 weeks (around 02/13/2020) for evaluation, procedure, med refill.   Procedure: L5-S1 LESI with fluoroscopic guidance and with moderate sedation  NOTE: The risks, benefits, and expectations of the procedure have been discussed and explained to the patient who was understanding and in agreement with suggested treatment plan. No guarantees were made.  DESCRIPTION OF PROCEDURE: Lumbar epidural steroid injection with 3 mg IV Versed, EKG, blood pressure, pulse, and pulse oximetry monitoring. The procedure was performed with the patient in the prone position under fluoroscopic guidance.  Sterile prep x3 was initiated and I then injected subcutaneous lidocaine to the overlying L5-S1 site after its fluoroscopic identifictation.  Using strict aseptic technique, I then advanced an 18-gauge Tuohy epidural needle in the midline using interlaminar approach via loss-of-resistance to saline technique. There was negative aspiration for heme or  CSF.  I then confirmed position with both AP and Lateral fluoroscan.  2 cc of contrast dye were injected and a  total of 5 mL of Preservative-Free normal saline mixed with 40 mg of Kenalog and 1cc Ropicaine 0.2 percent were injected incrementally via the  epidurally placed needle. The needle was removed. The patient tolerated the injection well and was convalesced and discharged to home in stable condition. Should the patient have any post procedure difficulty they have been instructed on how to contact us for  assistance.    Molli Barrows, MD

## 2020-01-07 ENCOUNTER — Telehealth: Payer: Medicare Other | Admitting: Anesthesiology

## 2020-01-16 ENCOUNTER — Ambulatory Visit: Payer: Self-pay | Admitting: *Deleted

## 2020-01-16 NOTE — Telephone Encounter (Signed)
Calls with upper left sided abdomen pain that she feels in her side. Began about one month ago and is intermittent and related to movement/activity. When she rest the pain resolves. Denies dizziness/SOB/CP/N/V/sweating/fever. No injury or fall noted. Care advice including  Rest, ice/heat/, compression and ibuprofen for the next 2 days then slowly increase activities. With no improvement call back for appointment. Reviewed urgent symptoms requiring immediate evaluation-stated understanding.   Reason for Disposition  [1] MILD-MODERATE pain AND [2] comes and goes (cramps)  Answer Assessment - Initial Assessment Questions 1. LOCATION: "Where does it hurt?"      Left abdomen-side pain with ADL's.  2. RADIATION: "Does the pain shoot anywhere else?" (e.g., chest, back)     Around the left side towards the back. 3. ONSET: "When did the pain begin?" (e.g., minutes, hours or days ago)      Onset occurred about one month ago. 4. SUDDEN: "Gradual or sudden onset?"     Gradual onset 5. PATTERN "Does the pain come and go, or is it constant?"    - If constant: "Is it getting better, staying the same, or worsening?"      (Note: Constant means the pain never goes away completely; most serious pain is constant and it progresses)     - If intermittent: "How long does it last?" "Do you have pain now?"     (Note: Intermittent means the pain goes away completely between bouts)   intermittant pain related to movement. Pain resolves with rest.  6. SEVERITY: "How bad is the pain?"  (e.g., Scale 1-10; mild, moderate, or severe)   - MILD (1-3): doesn't interfere with normal activities, abdomen soft and not tender to touch    - MODERATE (4-7): interferes with normal activities or awakens from sleep, tender to touch    - SEVERE (8-10): excruciating pain, doubled over, unable to do any normal activities      Nagging pain that interferes with daily activities.  7. RECURRENT SYMPTOM: "Have you ever had this type of  stomach pain before?" If Yes, ask: "When was the last time?" and "What happened that time?"      no 8. CAUSE: "What do you think is causing the stomach pain?"     unsure 9. RELIEVING/AGGRAVATING FACTORS: "What makes it better or worse?" (e.g., movement, antacids, bowel movement)     Rest, decreased movement, hydrocodone and tylenol. 10. OTHER SYMPTOMS: "Has there been any vomiting, diarrhea, constipation, or urine problems?"       none 11. PREGNANCY: "Is there any chance you are pregnant?" "When was your last menstrual period?"       na  Protocols used: ABDOMINAL PAIN - [facility]

## 2020-01-31 ENCOUNTER — Ambulatory Visit: Payer: Medicare Other | Attending: Anesthesiology | Admitting: Anesthesiology

## 2020-01-31 ENCOUNTER — Other Ambulatory Visit: Payer: Self-pay

## 2020-01-31 DIAGNOSIS — F1190 Opioid use, unspecified, uncomplicated: Secondary | ICD-10-CM

## 2020-01-31 DIAGNOSIS — M797 Fibromyalgia: Secondary | ICD-10-CM

## 2020-01-31 DIAGNOSIS — G894 Chronic pain syndrome: Secondary | ICD-10-CM

## 2020-01-31 DIAGNOSIS — G8929 Other chronic pain: Secondary | ICD-10-CM

## 2020-01-31 DIAGNOSIS — M159 Polyosteoarthritis, unspecified: Secondary | ICD-10-CM

## 2020-01-31 DIAGNOSIS — M5432 Sciatica, left side: Secondary | ICD-10-CM

## 2020-01-31 DIAGNOSIS — M47812 Spondylosis without myelopathy or radiculopathy, cervical region: Secondary | ICD-10-CM

## 2020-01-31 DIAGNOSIS — M5136 Other intervertebral disc degeneration, lumbar region: Secondary | ICD-10-CM

## 2020-01-31 DIAGNOSIS — M545 Low back pain, unspecified: Secondary | ICD-10-CM

## 2020-01-31 MED ORDER — HYDROCODONE-ACETAMINOPHEN 5-325 MG PO TABS: 1.0000 | ORAL_TABLET | Freq: Two times a day (BID) | ORAL | 0 refills | Status: DC | PRN

## 2020-02-01 ENCOUNTER — Other Ambulatory Visit: Payer: Self-pay | Admitting: Internal Medicine

## 2020-02-01 DIAGNOSIS — M797 Fibromyalgia: Secondary | ICD-10-CM

## 2020-02-01 NOTE — Telephone Encounter (Signed)
Requested medication (s) are due for refill today -yes  Requested medication (s) are on the active medication list -yes  Future visit scheduled -yes  Last refill: 10/15/19  Notes to clinic: Request non delegated Rx  Requested Prescriptions  Pending Prescriptions Disp Refills   Diclofenac-miSOPROStol 75-0.2 MG TBEC [Pharmacy Med Name: DICLOFENAC/MISOPROSTOL 75-0.2MG  TAB] 180 tablet 3    Sig: TAKE 1 TABLET BY MOUTH TWICE DAILY      Not Delegated - Analgesics:  Antirheumatic Agents - diclofenac/misoprostol Failed - 02/01/2020 11:56 AM      Failed - This refill cannot be delegated      Failed - HGB in normal range and within 360 days    Hemoglobin  Date Value Ref Range Status  05/09/2019 11.6 (L) 12.0 - 15.0 g/dL Final  03/28/2019 12.6 11.1 - 15.9 g/dL Final          Passed - Cr in normal range and within 360 days    Creatinine, Ser  Date Value Ref Range Status  05/09/2019 0.98 0.44 - 1.00 mg/dL Final          Passed - Patient is not pregnant      Passed - Valid encounter within last 12 months    Recent Outpatient Visits           1 month ago Acute non-recurrent maxillary sinusitis   [facility] [name], MD   6 months ago [facility] [name], MD   8 months ago Acute cystitis without hematuria   [facility] [name], MD   9 months ago COVID-14 virus infection   [facility] [name], MD   10 months ago Benign essential HTN   [facility] [name], MD       Future Appointments             In 1 month [facility] Melia Jesse Sans, MD [facility], [facility]                Requested Prescriptions  Pending Prescriptions Disp Refills   Diclofenac-miSOPROStol 75-0.2 MG TBEC [Pharmacy Med Name: DICLOFENAC/MISOPROSTOL 75-0.2MG  TAB] 180 tablet 3    Sig: TAKE 1 TABLET BY MOUTH TWICE DAILY      Not Delegated - Analgesics:  Antirheumatic Agents - diclofenac/misoprostol  Failed - 02/01/2020 11:56 AM      Failed - This refill cannot be delegated      Failed - HGB in normal range and within 360 days    Hemoglobin  Date Value Ref Range Status  05/09/2019 11.6 (L) 12.0 - 15.0 g/dL Final  03/28/2019 12.6 11.1 - 15.9 g/dL Final          Passed - Cr in normal range and within 360 days    Creatinine, Ser  Date Value Ref Range Status  05/09/2019 0.98 0.44 - 1.00 mg/dL Final          Passed - Patient is not pregnant      Passed - Valid encounter within last 12 months    Recent Outpatient Visits           1 month ago Acute non-recurrent maxillary sinusitis   [facility] [name], MD   6 months ago [facility] [name], MD   8 months ago Acute cystitis without hematuria   [facility] [name], MD   9 months ago COVID-36 virus  infection   [facility] [name], MD   10 months ago Benign essential HTN   [facility] [name], MD       Future Appointments             In 1 month [facility] Melia Jesse Sans, MD [facility], [facility]

## 2020-02-06 ENCOUNTER — Other Ambulatory Visit: Payer: Self-pay | Admitting: Internal Medicine

## 2020-02-11 ENCOUNTER — Other Ambulatory Visit: Payer: Self-pay | Admitting: Anesthesiology

## 2020-02-11 ENCOUNTER — Other Ambulatory Visit: Payer: Self-pay

## 2020-02-11 ENCOUNTER — Ambulatory Visit (HOSPITAL_BASED_OUTPATIENT_CLINIC_OR_DEPARTMENT_OTHER): Payer: Medicare Other | Admitting: Anesthesiology

## 2020-02-11 ENCOUNTER — Encounter: Payer: Self-pay | Admitting: Anesthesiology

## 2020-02-11 ENCOUNTER — Ambulatory Visit
Admission: RE | Admit: 2020-02-11 | Discharge: 2020-02-11 | Disposition: A | Discharge status: Home / Self Care | Payer: Medicare Other | Source: Ambulatory Visit | Attending: Anesthesiology | Admitting: Anesthesiology

## 2020-02-11 VITALS — BP 142/89 | HR 87 | Temp 97.8°F | Resp 16 | Ht 68.0 in | Wt 190.0 lb

## 2020-02-11 DIAGNOSIS — M5441 Lumbago with sciatica, right side: Secondary | ICD-10-CM | POA: Diagnosis not present

## 2020-02-11 DIAGNOSIS — G8929 Other chronic pain: Secondary | ICD-10-CM

## 2020-02-11 DIAGNOSIS — M5442 Lumbago with sciatica, left side: Secondary | ICD-10-CM | POA: Diagnosis not present

## 2020-02-11 DIAGNOSIS — R52 Pain, unspecified: Secondary | ICD-10-CM | POA: Diagnosis present

## 2020-02-11 DIAGNOSIS — M5136 Other intervertebral disc degeneration, lumbar region: Secondary | ICD-10-CM | POA: Diagnosis present

## 2020-02-11 DIAGNOSIS — M47816 Spondylosis without myelopathy or radiculopathy, lumbar region: Secondary | ICD-10-CM

## 2020-02-11 DIAGNOSIS — F1190 Opioid use, unspecified, uncomplicated: Secondary | ICD-10-CM | POA: Insufficient documentation

## 2020-02-11 DIAGNOSIS — M797 Fibromyalgia: Secondary | ICD-10-CM | POA: Insufficient documentation

## 2020-02-11 DIAGNOSIS — G894 Chronic pain syndrome: Secondary | ICD-10-CM | POA: Diagnosis present

## 2020-02-11 DIAGNOSIS — M545 Low back pain, unspecified: Secondary | ICD-10-CM | POA: Diagnosis present

## 2020-02-11 MED ORDER — TRIAMCINOLONE ACETONIDE 40 MG/ML IJ SUSP: 40.0000 mg | Freq: Once | INTRAMUSCULAR | Status: DC

## 2020-02-11 MED ORDER — HYDROCODONE-ACETAMINOPHEN 5-325 MG PO TABS: 1.0000 | ORAL_TABLET | Freq: Two times a day (BID) | ORAL | 0 refills | Status: DC | PRN

## 2020-02-11 MED ORDER — ROPIVACAINE HCL 2 MG/ML IJ SOLN
INTRAMUSCULAR | Status: AC
  Filled 2020-02-11: qty 10

## 2020-02-11 MED ORDER — LACTATED RINGERS IV SOLN
1000.0000 mL | INTRAVENOUS | Status: DC
  Administered 2020-02-11: 1000 mL via INTRAVENOUS

## 2020-02-11 MED ORDER — TRIAMCINOLONE ACETONIDE 40 MG/ML IJ SUSP
INTRAMUSCULAR | Status: AC
  Filled 2020-02-11: qty 1

## 2020-02-11 MED ORDER — LIDOCAINE HCL (PF) 1 % IJ SOLN
INTRAMUSCULAR | Status: AC
  Filled 2020-02-11: qty 10

## 2020-02-11 MED ORDER — SODIUM CHLORIDE (PF) 0.9 % IJ SOLN
INTRAMUSCULAR | Status: AC
  Filled 2020-02-11: qty 10

## 2020-02-11 MED ORDER — MIDAZOLAM HCL 5 MG/5ML IJ SOLN
5.0000 mg | Freq: Once | INTRAMUSCULAR | Status: AC
  Administered 2020-02-11: 2 mg via INTRAVENOUS

## 2020-02-11 MED ORDER — LIDOCAINE HCL (PF) 1 % IJ SOLN
5.0000 mL | Freq: Once | INTRAMUSCULAR | Status: AC
  Administered 2020-02-11: 5 mL via SUBCUTANEOUS

## 2020-02-11 MED ORDER — ROPIVACAINE HCL 2 MG/ML IJ SOLN
10.0000 mL | Freq: Once | INTRAMUSCULAR | Status: AC
  Administered 2020-02-11: 10 mL via EPIDURAL

## 2020-02-11 MED ORDER — MIDAZOLAM HCL 5 MG/5ML IJ SOLN
INTRAMUSCULAR | Status: AC
  Filled 2020-02-11: qty 5

## 2020-02-11 MED ORDER — SODIUM CHLORIDE 0.9% FLUSH
10.0000 mL | Freq: Once | INTRAVENOUS | Status: AC
  Administered 2020-02-11: 10 mL

## 2020-02-11 MED ORDER — IOHEXOL 180 MG/ML  SOLN
10.0000 mL | Freq: Once | INTRAMUSCULAR | Status: AC | PRN
  Administered 2020-02-11: 10 mL via EPIDURAL

## 2020-02-11 NOTE — Progress Notes (Signed)
Subjective:  Patient ID: Carrie Myers, female    DOB: 01-May-1945  Age: 74 y.o. MRN: [id number]  CC: Back Pain (left)   Procedure: L4-5 epidural steroid and fluoroscopic guidance with moderate sedation  HPI Carrie Myers presents for reevaluation.  She was last seen several weeks ago and had an epidural at that time.  She reports that she failed to gain any significant improvement in her low back pain and left lateral leg pain.  She is no longer having as much sciatica affecting the left calf or right leg.  She is taking her medications as prescribed and these continue to work well for her otherwise she is in her usual state of health and any change in bowel or bladder function or lower extremity strength or function.  She is having a lot of pain in the left lateral hip and radiating into the left lateral leg worse with activity and prolonged standing.  No other changes are noted and she is taking medications as prescribed these continue to work well for her.  She generally averages a Vicodin 1 or 2/day spaced out as well as her Valium for muscle spasms.  Outpatient Medications Prior to Visit  Medication Sig Dispense Refill  . aspirin EC 81 MG tablet Take 81 mg by mouth daily.    . citalopram (CELEXA) 20 MG tablet TAKE 1 TABLET BY MOUTH  DAILY 90 tablet 3  . diazepam (VALIUM) 5 MG tablet Take 1 tablet (5 mg total) by mouth at bedtime. 30 tablet 3  . Diclofenac-miSOPROStol 75-0.2 MG TBEC TAKE 1 TABLET BY MOUTH TWICE DAILY 180 tablet 1  . DULoxetine (CYMBALTA) 60 MG capsule TAKE 1 CAPSULE(60 MG) BY MOUTH DAILY 30 capsule 0  . esomeprazole (NEXIUM) 20 MG capsule Take 40 mg by mouth daily. Over the Counter    . levothyroxine (SYNTHROID) 88 MCG tablet TAKE 1 TABLET BY MOUTH  DAILY 90 tablet 0  . Loperamide HCl (IMODIUM PO) Take by mouth as needed.    Marland Kitchen losartan (COZAAR) 50 MG tablet Take 50 mg by mouth daily.    . mirabegron ER (MYRBETRIQ) 25 MG TB24 tablet Take 1 tablet (25 mg total) by  mouth daily. 30 tablet 7  . Multiple Vitamin (MULTI-VITAMINS) TABS Take by mouth.    . NON FORMULARY     . pravastatin (PRAVACHOL) 80 MG tablet Take 80 mg by mouth daily.    Marland Kitchen tolterodine (DETROL LA) 4 MG 24 hr capsule TAKE 1 CAPSULE(4 MG) BY MOUTH DAILY 90 capsule 3  . HYDROcodone-acetaminophen (NORCO/VICODIN) 5-325 MG tablet Take 1 tablet by mouth 2 (two) times daily as needed for moderate pain. 45 tablet 0  . conjugated estrogens (PREMARIN) vaginal cream Apply a green pea amount to the urethral opening nightly (Patient not taking: Reported on 02/11/2020) 42.5 g 1  . cyclobenzaprine (FLEXERIL) 10 MG tablet TAKE 1 TABLET BY MOUTH 3  TIMES DAILY (Patient not taking: Reported on 01/02/2020) 270 tablet 0  . HYDROcodone-acetaminophen (NORCO/VICODIN) 5-325 MG tablet Take 1 tablet by mouth 2 (two) times daily as needed for moderate pain. Limit 1-2 tablets by mouth per day if tolerated 45 tablet 0   No facility-administered medications prior to visit.    Review of Systems CNS: No confusion or sedation Cardiac: No angina or palpitations GI: No abdominal pain or constipation Constitutional: No nausea vomiting fevers or chills  Objective:  BP (!) 151/86   Pulse 87   Temp 97.9 F (36.6 C) (Temporal)  Resp 16   Ht 5\' 8"  (1.727 m)   Wt 190 lb (86.2 kg)   SpO2 95%   BMI 28.89 kg/m    BP Readings from Last 3 Encounters:  02/11/20 (!) 151/86  01/02/20 (!) 157/88  07/18/19 136/78     Wt Readings from Last 3 Encounters:  02/11/20 190 lb (86.2 kg)  01/02/20 190 lb (86.2 kg)  12/07/19 191 lb (86.6 kg)     Physical Exam Pt is alert and oriented PERRL EOMI HEART IS RRR no murmur or rub LCTA no wheezing or rales MUSCULOSKELETAL reveals some paraspinous muscle tenderness but no overt trigger points in the lumbar region.  She ambulates with an antalgic gait.  She also has problems with figure-of-four on the left side and this does reproduce her pain.  She has a positive straight leg raise  on this left side  Labs  No results found for: HGBA1C Lab Results  Component Value Date   LDLCALC 47 03/28/2019   CREATININE 0.98 05/09/2019    -------------------------------------------------------------------------------------------------------------------- Lab Results  Component Value Date   WBC 10.2 05/09/2019   HGB 11.6 (L) 05/09/2019   HCT 34.6 (L) 05/09/2019   PLT 231 05/09/2019   GLUCOSE 117 (H) 05/09/2019   CHOL 111 03/28/2019   TRIG 119 03/28/2019   HDL 43 03/28/2019   LDLCALC 47 03/28/2019   ALT 31 03/28/2019   AST 30 03/28/2019   NA 133 (L) 05/09/2019   K 3.9 05/09/2019   CL 100 05/09/2019   CREATININE 0.98 05/09/2019   BUN 16 05/09/2019   CO2 20 (L) 05/09/2019   TSH 2.260 03/28/2019    --------------------------------------------------------------------------------------------------------------------- No results found.   Assessment & Plan:   Carrie Myers was seen today for back pain.  Diagnoses and all orders for this visit:  Chronic bilateral low back pain with bilateral sciatica  DDD (degenerative disc disease), lumbar  Chronic pain syndrome  Fibromyalgia  Chronic, continuous use of opioids  Chronic bilateral low back pain without sciatica  Facet syndrome, lumbar  Other orders -     triamcinolone acetonide (KENALOG-40) injection 40 mg -     sodium chloride flush (NS) 0.9 % injection 10 mL -     ropivacaine (PF) 2 mg/mL (0.2%) (NAROPIN) injection 10 mL -     midazolam (VERSED) injection 5 mg -     lidocaine (PF) (XYLOCAINE) 1 % injection 5 mL -     lactated ringers infusion 1,000 mL -     iohexol (OMNIPAQUE) 180 MG/ML injection 10 mL -     HYDROcodone-acetaminophen (NORCO/VICODIN) 5-325 MG tablet; Take 1 tablet by mouth 2 (two) times daily as needed for moderate pain.        ----------------------------------------------------------------------------------------------------------------------  Problem List Items Addressed This Visit       Unprioritized   Chronic pain syndrome (Chronic)   Relevant Medications   triamcinolone acetonide (KENALOG-40) injection 40 mg (Start on 02/11/2020  1:45 PM)   ropivacaine (PF) 2 mg/mL (0.2%) (NAROPIN) injection 10 mL (Start on 02/11/2020  1:45 PM)   lidocaine (PF) (XYLOCAINE) 1 % injection 5 mL (Start on 02/11/2020  1:45 PM)   HYDROcodone-acetaminophen (NORCO/VICODIN) 5-325 MG tablet (Start on 03/30/2020)   Facet syndrome, lumbar   Relevant Medications   triamcinolone acetonide (KENALOG-40) injection 40 mg (Start on 02/11/2020  1:45 PM)   HYDROcodone-acetaminophen (NORCO/VICODIN) 5-325 MG tablet (Start on 03/30/2020)   Fibromyalgia   Relevant Medications   triamcinolone acetonide (KENALOG-40) injection 40 mg (Start on 02/11/2020  1:45  PM)   ropivacaine (PF) 2 mg/mL (0.2%) (NAROPIN) injection 10 mL (Start on 02/11/2020  1:45 PM)   lidocaine (PF) (XYLOCAINE) 1 % injection 5 mL (Start on 02/11/2020  1:45 PM)   HYDROcodone-acetaminophen (NORCO/VICODIN) 5-325 MG tablet (Start on 03/30/2020)    Other Visit Diagnoses    Chronic bilateral low back pain with bilateral sciatica    -  Primary   Relevant Medications   triamcinolone acetonide (KENALOG-40) injection 40 mg (Start on 02/11/2020  1:45 PM)   midazolam (VERSED) injection 5 mg (Start on 02/11/2020  1:45 PM)   HYDROcodone-acetaminophen (NORCO/VICODIN) 5-325 MG tablet (Start on 03/30/2020)   DDD (degenerative disc disease), lumbar       Relevant Medications   triamcinolone acetonide (KENALOG-40) injection 40 mg (Start on 02/11/2020  1:45 PM)   HYDROcodone-acetaminophen (NORCO/VICODIN) 5-325 MG tablet (Start on 03/30/2020)   Chronic, continuous use of opioids       Chronic bilateral low back pain without sciatica       Relevant Medications   triamcinolone acetonide (KENALOG-40) injection 40 mg (Start on 02/11/2020  1:45 PM)   HYDROcodone-acetaminophen (NORCO/VICODIN) 5-325 MG tablet (Start on 03/30/2020)         ----------------------------------------------------------------------------------------------------------------------  1. Chronic bilateral low back pain with bilateral sciatica We will proceed with a repeat epidural today but will going to switch to a L4-5 level.  We gone over the risks and benefits of the procedure with her in full detail and all questions were answered.  Continue core stretching strengthening exercises.  2. DDD (degenerative disc disease), lumbar As above  3. Chronic pain syndrome I have reviewed the [facility] practitioner database information and it is appropriate for refills will be dated for January 18 with return to clinic in 6 weeks  4. Fibromyalgia As above and continue activity as reviewed today.  We have also shown her some stretches for figure-of-four and iliotibial band syndrome treatment in addition to massage therapy.  5. Chronic, continuous use of opioids As above  6. Chronic bilateral low back pain without sciatica As above  7. Facet syndrome, lumbar As above with continued core strengthening.    ----------------------------------------------------------------------------------------------------------------------  I am having Carrie Myers maintain her esomeprazole, losartan, pravastatin, aspirin EC, Multi-Vitamins, NON FORMULARY, cyclobenzaprine, Loperamide HCl (IMODIUM PO), citalopram, tolterodine, conjugated estrogens, mirabegron ER, DULoxetine, HYDROcodone-acetaminophen, diazepam, Diclofenac-miSOPROStol, levothyroxine, and HYDROcodone-acetaminophen.   Meds ordered this encounter  Medications  . triamcinolone acetonide (KENALOG-40) injection 40 mg  . sodium chloride flush (NS) 0.9 % injection 10 mL  . ropivacaine (PF) 2 mg/mL (0.2%) (NAROPIN) injection 10 mL  . midazolam (VERSED) injection 5 mg  . lidocaine (PF) (XYLOCAINE) 1 % injection 5 mL  . lactated ringers infusion 1,000 mL  . iohexol (OMNIPAQUE) 180 MG/ML  injection 10 mL  . HYDROcodone-acetaminophen (NORCO/VICODIN) 5-325 MG tablet    Sig: Take 1 tablet by mouth 2 (two) times daily as needed for moderate pain.    Dispense:  45 tablet    Refill:  0    To be filled once per month..30 day supply   Patient's Medications  New Prescriptions   No medications on file  Previous Medications   ASPIRIN EC 81 MG TABLET    Take 81 mg by mouth daily.   CITALOPRAM (CELEXA) 20 MG TABLET    TAKE 1 TABLET BY MOUTH  DAILY   CONJUGATED ESTROGENS (PREMARIN) VAGINAL CREAM    Apply a green pea amount to the urethral opening nightly   CYCLOBENZAPRINE (FLEXERIL) 10  MG TABLET    TAKE 1 TABLET BY MOUTH 3  TIMES DAILY   DIAZEPAM (VALIUM) 5 MG TABLET    Take 1 tablet (5 mg total) by mouth at bedtime.   DICLOFENAC-MISOPROSTOL 75-0.2 MG TBEC    TAKE 1 TABLET BY MOUTH TWICE DAILY   DULOXETINE (CYMBALTA) 60 MG CAPSULE    TAKE 1 CAPSULE(60 MG) BY MOUTH DAILY   ESOMEPRAZOLE (NEXIUM) 20 MG CAPSULE    Take 40 mg by mouth daily. Over the Counter   HYDROCODONE-ACETAMINOPHEN (NORCO/VICODIN) 5-325 MG TABLET    Take 1 tablet by mouth 2 (two) times daily as needed for moderate pain. Limit 1-2 tablets by mouth per day if tolerated   LEVOTHYROXINE (SYNTHROID) 88 MCG TABLET    TAKE 1 TABLET BY MOUTH  DAILY   LOPERAMIDE HCL (IMODIUM PO)    Take by mouth as needed.   LOSARTAN (COZAAR) 50 MG TABLET    Take 50 mg by mouth daily.   MIRABEGRON ER (MYRBETRIQ) 25 MG TB24 TABLET    Take 1 tablet (25 mg total) by mouth daily.   MULTIPLE VITAMIN (MULTI-VITAMINS) TABS    Take by mouth.   NON FORMULARY       PRAVASTATIN (PRAVACHOL) 80 MG TABLET    Take 80 mg by mouth daily.   TOLTERODINE (DETROL LA) 4 MG 24 HR CAPSULE    TAKE 1 CAPSULE(4 MG) BY MOUTH DAILY  Modified Medications   Modified Medication Previous Medication   HYDROCODONE-ACETAMINOPHEN (NORCO/VICODIN) 5-325 MG TABLET HYDROcodone-acetaminophen (NORCO/VICODIN) 5-325 MG tablet      Take 1 tablet by mouth 2 (two) times daily as needed for  moderate pain.    Take 1 tablet by mouth 2 (two) times daily as needed for moderate pain.  Discontinued Medications   No medications on file   ----------------------------------------------------------------------------------------------------------------------  Follow-up: Return in about 6 weeks (around 03/24/2020) for evaluation, procedure.   Procedure: L4-5 LESI with fluoroscopic guidance and with moderate sedation  NOTE: The risks, benefits, and expectations of the procedure have been discussed and explained to the patient who was understanding and in agreement with suggested treatment plan. No guarantees were made.  DESCRIPTION OF PROCEDURE: Lumbar epidural steroid injection with 2 mg IV Versed, EKG, blood pressure, pulse, and pulse oximetry monitoring. The procedure was performed with the patient in the prone position under fluoroscopic guidance.  Sterile prep x3 was initiated and I then injected subcutaneous lidocaine to the overlying L4-5 site after its fluoroscopic identifictation.  Using strict aseptic technique, I then advanced an 18-gauge Tuohy epidural needle in the midline using interlaminar approach via loss-of-resistance to saline technique. There was negative aspiration for heme or  CSF.  I then confirmed position with both AP and Lateral fluoroscan.  2 cc of contrast dye were injected and a  total of 5 mL of Preservative-Free normal saline mixed with 40 mg of Kenalog and 1cc Ropicaine 0.2 percent were injected incrementally via the  epidurally placed needle. The needle was removed. The patient tolerated the injection well and was convalesced and discharged to home in stable condition. Should the patient have any post procedure difficulty they have been instructed on how to contact us for assistance.    Molli Barrows, MD

## 2020-02-11 NOTE — Progress Notes (Signed)
Safety precautions to be maintained throughout the outpatient stay will include: orient to surroundings, keep bed in low position, maintain call bell within reach at all times, provide assistance with transfer out of bed and ambulation.  

## 2020-02-11 NOTE — Patient Instructions (Addendum)
____________________________________________________________________________________________  Post-Procedure Discharge Instructions  Instructions: Apply ice:  Purpose: This will minimize any swelling and discomfort after procedure.  When: Day of procedure, as soon as you get home. How: Fill a plastic sandwich bag with crushed ice. Cover it with a small towel and apply to injection site. How long: (15 min on, 15 min off) Apply for 15 minutes then remove x 15 minutes.  Repeat sequence on day of procedure, until you go to bed. Apply heat:  Purpose: To treat any soreness and discomfort from the procedure. When: Starting the next day after the procedure. How: Apply heat to procedure site starting the day following the procedure. How long: May continue to repeat daily, until discomfort goes away. Food intake: Start with clear liquids (like water) and advance to regular food, as tolerated.  Physical activities: Keep activities to a minimum for the first 8 hours after the procedure. After that, then as tolerated. Driving: If you have received any sedation, be responsible and do not drive. You are not allowed to drive for 24 hours after having sedation. Blood thinner: (Applies only to those taking blood thinners) You may restart your blood thinner 6 hours after your procedure. Insulin: (Applies only to Diabetic patients taking insulin) As soon as you can eat, you may resume your normal dosing schedule. Infection prevention: Keep procedure site clean and dry. Shower daily and clean area with soap and water. Post-procedure Pain Diary: Extremely important that this be done correctly and accurately. Recorded information will be used to determine the next step in treatment. For the purpose of accuracy, follow these rules: Evaluate only the area treated. Do not report or include pain from an untreated area. For the purpose of this evaluation, ignore all other areas of pain, except for the treated  area. After your procedure, avoid taking a long nap and attempting to complete the pain diary after you wake up. Instead, set your alarm clock to go off every hour, on the hour, for the initial 8 hours after the procedure. Document the duration of the numbing medicine, and the relief you are getting from it. Do not go to sleep and attempt to complete it later. It will not be accurate. If you received sedation, it is likely that you were given a medication that may cause amnesia. Because of this, completing the diary at a later time may cause the information to be inaccurate. This information is needed to plan your care. Follow-up appointment: Keep your post-procedure follow-up evaluation appointment after the procedure (usually 2 weeks for most procedures, 6 weeks for radiofrequencies). DO NOT FORGET to bring you pain diary with you.   Expect: (What should I expect to see with my procedure?) From numbing medicine (AKA: Local Anesthetics): Numbness or decrease in pain. You may also experience some weakness, which if present, could last for the duration of the local anesthetic. Onset: Full effect within 15 minutes of injected. Duration: It will depend on the type of local anesthetic used. On the average, 1 to 8 hours.  From steroids (Applies only if steroids were used): Decrease in swelling or inflammation. Once inflammation is improved, relief of the pain will follow. Onset of benefits: Depends on the amount of swelling present. The more swelling, the longer it will take for the benefits to be seen. In some cases, up to 10 days. Duration: Steroids will stay in the system x 2 weeks. Duration of benefits will depend on multiple posibilities including persistent irritating factors. Side-effects: If present, they   may typically last 2 weeks (the duration of the steroids). Frequent: Cramps (if they occur, drink Gatorade and take over-the-counter Magnesium 450-500 mg once to twice a day); water retention with  temporary weight gain; increases in blood sugar; decreased immune system response; increased appetite. Occasional: Facial flushing (red, warm cheeks); mood swings; menstrual changes. Uncommon: Long-term decrease or suppression of natural hormones; bone thinning. (These are more common with higher doses or more frequent use. This is why we prefer that our patients avoid having any injection therapies in other practices.)  Very Rare: Severe mood changes; psychosis; aseptic necrosis. From procedure: Some discomfort is to be expected once the numbing medicine wears off. This should be minimal if ice and heat are applied as instructed.  Call if: (When should I call?) You experience numbness and weakness that gets worse with time, as opposed to wearing off. New onset bowel or bladder incontinence. (Applies only to procedures done in the spine)  Emergency Numbers: [place] business hours (Monday - Thursday, 8:00 AM - 4:00 PM) (Friday, 9:00 AM - 12:00 Noon): [phone] After hours: [phone] NOTE: If you are having a problem and are unable connect with, or to talk to a provider, then go to your nearest urgent care or emergency department. If the problem is serious and urgent, please call 911. ____________________________________________________________________________________________   ____________________________________________________________________________________________  Post-Procedure Discharge Instructions  Instructions: Apply ice:  Purpose: This will minimize any swelling and discomfort after procedure.  When: Day of procedure, as soon as you get home. How: Fill a plastic sandwich bag with crushed ice. Cover it with a small towel and apply to injection site. How long: (15 min on, 15 min off) Apply for 15 minutes then remove x 15 minutes.  Repeat sequence on day of procedure, until you go to bed. Apply heat:  Purpose: To treat any soreness and discomfort from the  procedure. When: Starting the next day after the procedure. How: Apply heat to procedure site starting the day following the procedure. How long: May continue to repeat daily, until discomfort goes away. Food intake: Start with clear liquids (like water) and advance to regular food, as tolerated.  Physical activities: Keep activities to a minimum for the first 8 hours after the procedure. After that, then as tolerated. Driving: If you have received any sedation, be responsible and do not drive. You are not allowed to drive for 24 hours after having sedation. Blood thinner: (Applies only to those taking blood thinners) You may restart your blood thinner 6 hours after your procedure. Insulin: (Applies only to Diabetic patients taking insulin) As soon as you can eat, you may resume your normal dosing schedule. Infection prevention: Keep procedure site clean and dry. Shower daily and clean area with soap and water. Post-procedure Pain Diary: Extremely important that this be done correctly and accurately. Recorded information will be used to determine the next step in treatment. For the purpose of accuracy, follow these rules: Evaluate only the area treated. Do not report or include pain from an untreated area. For the purpose of this evaluation, ignore all other areas of pain, except for the treated area. After your procedure, avoid taking a long nap and attempting to complete the pain diary after you wake up. Instead, set your alarm clock to go off every hour, on the hour, for the initial 8 hours after the procedure. Document the duration of the numbing medicine, and the relief you are getting from it. Do not go to sleep and attempt   to complete it later. It will not be accurate. If you received sedation, it is likely that you were given a medication that may cause amnesia. Because of this, completing the diary at a later time may cause the information to be inaccurate. This information is needed to plan  your care. Follow-up appointment: Keep your post-procedure follow-up evaluation appointment after the procedure (usually 2 weeks for most procedures, 6 weeks for radiofrequencies). DO NOT FORGET to bring you pain diary with you.   Expect: (What should I expect to see with my procedure?) From numbing medicine (AKA: Local Anesthetics): Numbness or decrease in pain. You may also experience some weakness, which if present, could last for the duration of the local anesthetic. Onset: Full effect within 15 minutes of injected. Duration: It will depend on the type of local anesthetic used. On the average, 1 to 8 hours.  From steroids (Applies only if steroids were used): Decrease in swelling or inflammation. Once inflammation is improved, relief of the pain will follow. Onset of benefits: Depends on the amount of swelling present. The more swelling, the longer it will take for the benefits to be seen. In some cases, up to 10 days. Duration: Steroids will stay in the system x 2 weeks. Duration of benefits will depend on multiple posibilities including persistent irritating factors. Side-effects: If present, they may typically last 2 weeks (the duration of the steroids). Frequent: Cramps (if they occur, drink Gatorade and take over-the-counter Magnesium 450-500 mg once to twice a day); water retention with temporary weight gain; increases in blood sugar; decreased immune system response; increased appetite. Occasional: Facial flushing (red, warm cheeks); mood swings; menstrual changes. Uncommon: Long-term decrease or suppression of natural hormones; bone thinning. (These are more common with higher doses or more frequent use. This is why we prefer that our patients avoid having any injection therapies in other practices.)  Very Rare: Severe mood changes; psychosis; aseptic necrosis. From procedure: Some discomfort is to be expected once the numbing medicine wears off. This should be minimal if ice and heat are  applied as instructed.  Call if: (When should I call?) You experience numbness and weakness that gets worse with time, as opposed to wearing off. New onset bowel or bladder incontinence. (Applies only to procedures done in the spine)  Emergency Numbers: [place] business hours (Monday - Thursday, 8:00 AM - 4:00 PM) (Friday, 9:00 AM - 12:00 Noon): [phone] After hours: [phone] NOTE: If you are having a problem and are unable connect with, or to talk to a provider, then go to your nearest urgent care or emergency department. If the problem is serious and urgent, please call 911. ____________________________________________________________________________________________   

## 2020-02-12 ENCOUNTER — Telehealth: Payer: Self-pay

## 2020-02-12 NOTE — Telephone Encounter (Signed)
Post procedure follow up phone call.  LM 

## 2020-02-13 DIAGNOSIS — M25541 Pain in joints of right hand: Secondary | ICD-10-CM | POA: Diagnosis not present

## 2020-02-13 DIAGNOSIS — M1811 Unilateral primary osteoarthritis of first carpometacarpal joint, right hand: Secondary | ICD-10-CM | POA: Diagnosis not present

## 2020-03-20 DIAGNOSIS — H6122 Impacted cerumen, left ear: Secondary | ICD-10-CM | POA: Diagnosis not present

## 2020-03-20 DIAGNOSIS — R059 Cough, unspecified: Secondary | ICD-10-CM | POA: Diagnosis not present

## 2020-03-20 DIAGNOSIS — K219 Gastro-esophageal reflux disease without esophagitis: Secondary | ICD-10-CM | POA: Diagnosis not present

## 2020-03-28 ENCOUNTER — Encounter: Payer: Self-pay | Admitting: Internal Medicine

## 2020-03-28 ENCOUNTER — Other Ambulatory Visit: Payer: Self-pay

## 2020-03-28 ENCOUNTER — Telehealth: Payer: Self-pay

## 2020-03-28 ENCOUNTER — Ambulatory Visit (INDEPENDENT_AMBULATORY_CARE_PROVIDER_SITE_OTHER): Payer: Medicare Other | Admitting: Internal Medicine

## 2020-03-28 ENCOUNTER — Encounter: Payer: Medicare Other | Admitting: Internal Medicine

## 2020-03-28 VITALS — BP 138/82 | HR 83 | Temp 98.1°F | Ht 68.0 in | Wt 191.0 lb

## 2020-03-28 DIAGNOSIS — E782 Mixed hyperlipidemia: Secondary | ICD-10-CM

## 2020-03-28 DIAGNOSIS — M4696 Unspecified inflammatory spondylopathy, lumbar region: Secondary | ICD-10-CM

## 2020-03-28 DIAGNOSIS — E890 Postprocedural hypothyroidism: Secondary | ICD-10-CM

## 2020-03-28 DIAGNOSIS — Z9989 Dependence on other enabling machines and devices: Secondary | ICD-10-CM | POA: Diagnosis not present

## 2020-03-28 DIAGNOSIS — G4733 Obstructive sleep apnea (adult) (pediatric): Secondary | ICD-10-CM

## 2020-03-28 DIAGNOSIS — Z23 Encounter for immunization: Secondary | ICD-10-CM

## 2020-03-28 DIAGNOSIS — R7309 Other abnormal glucose: Secondary | ICD-10-CM | POA: Diagnosis not present

## 2020-03-28 DIAGNOSIS — Z1231 Encounter for screening mammogram for malignant neoplasm of breast: Secondary | ICD-10-CM

## 2020-03-28 DIAGNOSIS — K219 Gastro-esophageal reflux disease without esophagitis: Secondary | ICD-10-CM

## 2020-03-28 DIAGNOSIS — I10 Essential (primary) hypertension: Secondary | ICD-10-CM | POA: Diagnosis not present

## 2020-03-28 DIAGNOSIS — F331 Major depressive disorder, recurrent, moderate: Secondary | ICD-10-CM | POA: Diagnosis not present

## 2020-03-28 LAB — POCT URINALYSIS DIPSTICK
Bilirubin, UA: NEGATIVE
Blood, UA: NEGATIVE
Glucose, UA: NEGATIVE
Ketones, UA: NEGATIVE
Leukocytes, UA: NEGATIVE
Nitrite, UA: NEGATIVE
Protein, UA: NEGATIVE
Spec Grav, UA: 1.015 (ref 1.010–1.025)
Urobilinogen, UA: 0.2 E.U./dL
pH, UA: 6 (ref 5.0–8.0)

## 2020-03-28 NOTE — Telephone Encounter (Signed)
Called pt left her know that it was ok if she came to her visit today. Pt was not in contact with anyone having COVID. Pt verbalized understanding.  KP

## 2020-03-28 NOTE — Telephone Encounter (Signed)
Copied from [facility] [place] [id number]. Topic: General - Other >> Mar 28, 2020  8:04 AM Leward Quan A wrote: Reason for CRM: Patient called in stated that she admitted her brother into a home yesterday and later found out several residents have Covid and she was just curious if she should still come in for her visit today. She was not in direct contact with any of them but just curious. Please call Ph# [phone]

## 2020-03-28 NOTE — Progress Notes (Signed)
Date:  03/28/2020   Name:  Carrie Myers   DOB:  1946-01-06   MRN:  [id number]   Chief Complaint: Annual Exam (Breast exam no pap) and Flu Vaccine  Carrie Myers is a 75 y.o. female who presents today for her Complete Annual Exam. She feels well. She reports exercising none. She reports she is sleeping well. Breast complaints none.  Mammogram: 05/2019 DEXA: 04/2018 osteopenia hip Colonoscopy: 03/2018  Immunization History  Administered Date(s) Administered  . Fluad Quad(high Dose 65+) 03/28/2019  . Influenza, High Dose Seasonal PF 12/31/2016, 12/05/2017  . Moderna Sars-Covid-2 Vaccination 11/09/2019, 12/13/2019  . Pneumococcal Conjugate-13 01/30/2014  . Pneumococcal Polysaccharide-23 09/22/2012  . Tdap 09/08/2011    Hypertension This is a chronic problem. The problem is controlled. Pertinent negatives include no chest pain, headaches, palpitations or shortness of breath. Past treatments include angiotensin blockers. The current treatment provides significant improvement. There are no compliance problems.  There is no history of kidney disease, CAD/MI or CVA. Identifiable causes of hypertension include a thyroid problem.  Depression        This is a chronic problem.  The problem has been resolved since onset.  Associated symptoms include fatigue.  Associated symptoms include no headaches.  Past treatments include SNRIs - Serotonin and norepinephrine reuptake inhibitors and SSRIs - Selective serotonin reuptake inhibitors.  Compliance with treatment is good.  Past medical history includes thyroid problem.   Thyroid Problem Presents for follow-up visit. Symptoms include fatigue. Patient reports no anxiety, constipation, diarrhea, palpitations or tremors. The symptoms have been stable. Her past medical history is significant for hyperlipidemia.  Hyperlipidemia The problem is controlled. Pertinent negatives include no chest pain or shortness of breath. Current antihyperlipidemic  treatment includes statins. The current treatment provides significant improvement of lipids.  Gastroesophageal Reflux She complains of coughing (possible gerd but referred to Pulmonary) and heartburn. She reports no abdominal pain, no chest pain or no wheezing. This is a recurrent problem. The problem occurs rarely. The heartburn is of mild intensity. The heartburn wakes (cough) her from sleep. Associated symptoms include fatigue. She has tried a PPI for the symptoms. The treatment provided significant relief.    Lab Results  Component Value Date   CREATININE 0.98 05/09/2019   BUN 16 05/09/2019   NA 133 (L) 05/09/2019   K 3.9 05/09/2019   CL 100 05/09/2019   CO2 20 (L) 05/09/2019   Lab Results  Component Value Date   CHOL 111 03/28/2019   HDL 43 03/28/2019   LDLCALC 47 03/28/2019   TRIG 119 03/28/2019   CHOLHDL 2.6 03/28/2019   Lab Results  Component Value Date   TSH 2.260 03/28/2019   No results found for: HGBA1C Lab Results  Component Value Date   WBC 10.2 05/09/2019   HGB 11.6 (L) 05/09/2019   HCT 34.6 (L) 05/09/2019   MCV 91.3 05/09/2019   PLT 231 05/09/2019   Lab Results  Component Value Date   ALT 31 03/28/2019   AST 30 03/28/2019   ALKPHOS 78 03/28/2019   BILITOT 0.3 03/28/2019     Review of Systems  Constitutional: Positive for fatigue. Negative for chills and fever.  HENT: Negative for congestion, hearing loss, tinnitus, trouble swallowing and voice change.   Eyes: Negative for visual disturbance.  Respiratory: Positive for cough (possible gerd but referred to Pulmonary). Negative for chest tightness, shortness of breath and wheezing.   Cardiovascular: Negative for chest pain, palpitations and leg swelling.  Gastrointestinal: Positive for  heartburn. Negative for abdominal pain, constipation, diarrhea and vomiting.  Endocrine: Negative for polydipsia and polyuria.  Genitourinary: Negative for dysuria, frequency, genital sores, vaginal bleeding and vaginal  discharge.  Musculoskeletal: Negative for arthralgias, gait problem and joint swelling.  Skin: Negative for color change and rash.  Neurological: Negative for dizziness, tremors, light-headedness and headaches.  Hematological: Negative for adenopathy. Does not bruise/bleed easily.  Psychiatric/Behavioral: Positive for depression. Negative for dysphoric mood and sleep disturbance. The patient is not nervous/anxious.     Patient Active Problem List   Diagnosis Date Noted  . Slow transit constipation 06/05/2019  . Sciatica, left side 01/01/2019  . Diverticulosis of colon 04/25/2018  . Change in bowel habits   . Tobacco use disorder, moderate, in sustained remission 12/22/2017  . Moderate episode of recurrent major depressive disorder ([facility]) 10/03/2017  . Inflammatory spondylopathy of lumbar region ([place]) 10/03/2017  . Hoarseness, persistent 04/18/2017  . OSA on CPAP 02/18/2017  . Cervical disc disease 12/31/2016  . Generalized osteoarthritis 12/23/2016  . Chronic pain syndrome 12/23/2016  . Cystocele, midline 12/06/2016  . Urinary incontinence 12/06/2016  . Impingement syndrome of right shoulder 04/17/2015  . Fibrocystic breast 01/18/2015  . Gastroesophageal reflux disease without esophagitis 01/18/2015  . Hypothyroidism, postablative 01/18/2015  . Arteriosclerosis of coronary artery 12/29/2014  . DJD of shoulder 11/13/2014  . Fibromyalgia 10/02/2014  . Intercostal neuralgia 10/02/2014  . Facet syndrome, lumbar 10/02/2014  . Sacroiliac joint disease 10/02/2014  . Carotid artery narrowing 07/09/2014  . Benign essential HTN 07/01/2014  . Osteoarthritis of thumb 09/24/2012  . Mixed hyperlipidemia 09/22/2012  . Arthritis of hand, degenerative 09/22/2012    Allergies  Allergen Reactions  . Augmentin [Amoxicillin-Pot Clavulanate] Nausea And Vomiting  . Codeine Nausea And Vomiting  . Sulfa Antibiotics Nausea And Vomiting  . Proparacaine Itching  . Shellfish Allergy Nausea And  Vomiting    No issues with iodine     Past Surgical History:  Procedure Laterality Date  . ABDOMINAL HYSTERECTOMY     total  . APPENDECTOMY    . BREAST BIOPSY Right    neg  . BREAST CYST ASPIRATION Left    neg  . BUNIONECTOMY Bilateral   . CESAREAN SECTION    . COLONOSCOPY  2010   normal  . COLONOSCOPY WITH PROPOFOL N/A 04/03/2018   Procedure: COLONOSCOPY WITH BIOPSIES;  Surgeon: Lucilla Lame, MD;  Location: [facility];  Service: Endoscopy;  Laterality: N/A;  sleep apnea  . CORONARY ANGIOPLASTY WITH STENT PLACEMENT  04/2006  . dental implant     [age]  . epidural steroid injection  2018  . SHOULDER ARTHROSCOPY Right 05/07/2015   Procedure: Extensive arthroscopic debridement and arthroscopic subacromial decompression, right shoulder.  2. Steroid injection right thumb CMC joint.;  Surgeon: Corky Mull, MD;  Location: [facility];  Service: Orthopedics;  Laterality: Right;  . TOOTH EXTRACTION     Upper centrals and lateral    Social History   Tobacco Use  . Smoking status: Former Smoker    Packs/day: 1.25    Years: 44.00    Pack years: 55.00    Types: Cigarettes    Quit date: 04/22/2006    Years since quitting: 13.9  . Smokeless tobacco: Never Used  . Tobacco comment:    Vaping Use  . Vaping Use: Never used  Substance Use Topics  . Alcohol use: No    Alcohol/week: 0.0 standard drinks  . Drug use: No     Medication list has been  reviewed and updated.  Current Meds  Medication Sig  . aspirin EC 81 MG tablet Take 81 mg by mouth daily.  . citalopram (CELEXA) 20 MG tablet TAKE 1 TABLET BY MOUTH  DAILY  . diazepam (VALIUM) 5 MG tablet Take 5 mg by mouth at bedtime.  . Diclofenac-miSOPROStol 75-0.2 MG TBEC TAKE 1 TABLET BY MOUTH TWICE DAILY  . DULoxetine (CYMBALTA) 60 MG capsule TAKE 1 CAPSULE(60 MG) BY MOUTH DAILY  . esomeprazole (NEXIUM) 20 MG capsule Take 40 mg by mouth daily. Over the Counter  . HYDROcodone-acetaminophen  (NORCO/VICODIN) 5-325 MG tablet Take 1 tablet by mouth 2 (two) times daily as needed for moderate pain. Limit 1-2 tablets by mouth per day if tolerated  . [START ON 03/30/2020] HYDROcodone-acetaminophen (NORCO/VICODIN) 5-325 MG tablet Take 1 tablet by mouth 2 (two) times daily as needed for moderate pain.  Marland Kitchen levothyroxine (SYNTHROID) 88 MCG tablet TAKE 1 TABLET BY MOUTH  DAILY  . Loperamide HCl (IMODIUM PO) Take by mouth as needed.  Marland Kitchen losartan (COZAAR) 50 MG tablet Take 50 mg by mouth daily.  . mirabegron ER (MYRBETRIQ) 25 MG TB24 tablet Take 1 tablet (25 mg total) by mouth daily.  . Multiple Vitamin (MULTI-VITAMINS) TABS Take by mouth.  . NON FORMULARY   . pravastatin (PRAVACHOL) 80 MG tablet Take 80 mg by mouth daily.  Marland Kitchen tolterodine (DETROL LA) 4 MG 24 hr capsule TAKE 1 CAPSULE(4 MG) BY MOUTH DAILY    PHQ 2/9 Scores 03/28/2020 01/02/2020 12/07/2019 07/18/2019  PHQ - 2 Score 0 0 0 0  PHQ- 9 Score 0 - 0 0  Exception Documentation - - - -    GAD 7 : Generalized Anxiety Score 03/28/2020 12/07/2019 06/05/2019  Nervous, Anxious, on Edge 0 0 0  Control/stop worrying 0 0 0  Worry too much - different things 0 0 0  Trouble relaxing 0 0 0  Restless 0 0 0  Easily annoyed or irritable 0 0 0  Afraid - awful might happen 0 0 0  Total GAD 7 Score 0 0 0  Anxiety Difficulty - Not difficult at all Not difficult at all    BP Readings from Last 3 Encounters:  03/28/20 138/82  02/11/20 (!) 142/89  01/02/20 (!) 157/88    Physical Exam Vitals and nursing note reviewed.  Constitutional:      General: She is not in acute distress.    Appearance: She is well-developed.  HENT:     Head: Normocephalic and atraumatic.     Right Ear: Tympanic membrane and ear canal normal.     Left Ear: Tympanic membrane and ear canal normal.     Nose:     Right Sinus: No maxillary sinus tenderness.     Left Sinus: No maxillary sinus tenderness.  Eyes:     General: No scleral icterus.       Right eye: No discharge.         Left eye: No discharge.     Conjunctiva/sclera: Conjunctivae normal.  Neck:     Thyroid: No thyromegaly.     Vascular: No carotid bruit.  Cardiovascular:     Rate and Rhythm: Normal rate and regular rhythm.     Pulses: Normal pulses.     Heart sounds: Normal heart sounds. No murmur heard.   Pulmonary:     Effort: Pulmonary effort is normal. No respiratory distress.     Breath sounds: No wheezing.  Chest:  Breasts:     Right: No mass, nipple  discharge, skin change or tenderness.     Left: No mass, nipple discharge, skin change or tenderness.    Abdominal:     General: Bowel sounds are normal.     Palpations: Abdomen is soft.     Tenderness: There is no abdominal tenderness.  Musculoskeletal:        General: Normal range of motion.     Cervical back: Normal range of motion. No erythema.     Right lower leg: No edema.     Left lower leg: No edema.  Lymphadenopathy:     Cervical: No cervical adenopathy.  Skin:    General: Skin is warm and dry.     Capillary Refill: Capillary refill takes less than 2 seconds.     Findings: No rash.  Neurological:     General: No focal deficit present.     Mental Status: She is alert and oriented to person, place, and time.     Cranial Nerves: No cranial nerve deficit.     Sensory: No sensory deficit.     Deep Tendon Reflexes: Reflexes are normal and symmetric.  Psychiatric:        Attention and Perception: Attention normal.        Mood and Affect: Mood normal.        Behavior: Behavior normal.     Wt Readings from Last 3 Encounters:  03/28/20 191 lb (86.6 kg)  02/11/20 190 lb (86.2 kg)  01/02/20 190 lb (86.2 kg)    BP 138/82   Pulse 83   Temp 98.1 F (36.7 C) (Oral)   Ht 5\' 8"  (1.727 m)   Wt 191 lb (86.6 kg)   SpO2 97%   BMI 29.04 kg/m   Assessment and Plan: 1. Benign essential HTN Clinically stable exam with well controlled BP. Tolerating medications without side effects at this time. Pt to continue current regimen  and low sodium diet; benefits of regular exercise as able discussed. - Comprehensive metabolic panel - POCT urinalysis dipstick  2. Encounter for screening mammogram for breast cancer Schedule at [facility]; Future  3. Gastroesophageal reflux disease without esophagitis Now back on PPI with gerd sx well controlled Cough is slightly improved - CBC with Differential/Platelet  4. Hypothyroidism, postablative supplemented - TSH + free T4  5. Moderate episode of recurrent major depressive disorder ([facility]) Clinically stable on current regimen with good control of symptoms, No SI or HI. Will continue current therapy with Cymbalta and Citalopram.  6. Mixed hyperlipidemia Tolerating statin medication without side effects at this time LDL is at goal of < 70 on current dose Continue same therapy without change at this time - Lipid panel  7. OSA on CPAP Doing well on CPAP nightly Has appointment with Pulmonary regarding chronic cough  8. Inflammatory spondylopathy of lumbar region [facility]) On daily narcotics from pain management   Partially dictated using [place]. Any errors are unintentional.  Halina Maidens, MD [facility]  03/28/2020

## 2020-03-29 LAB — CBC WITH DIFFERENTIAL/PLATELET
Basophils Absolute: 0.1 10*3/uL (ref 0.0–0.2)
Basos: 1 %
EOS (ABSOLUTE): 0.2 10*3/uL (ref 0.0–0.4)
Eos: 3 %
Hematocrit: 38.2 % (ref 34.0–46.6)
Hemoglobin: 13.2 g/dL (ref 11.1–15.9)
Immature Grans (Abs): 0 10*3/uL (ref 0.0–0.1)
Immature Granulocytes: 0 %
Lymphocytes Absolute: 1.6 10*3/uL (ref 0.7–3.1)
Lymphs: 24 %
MCH: 30.6 pg (ref 26.6–33.0)
MCHC: 34.6 g/dL (ref 31.5–35.7)
MCV: 88 fL (ref 79–97)
Monocytes Absolute: 0.5 10*3/uL (ref 0.1–0.9)
Monocytes: 7 %
Neutrophils Absolute: 4.2 10*3/uL (ref 1.4–7.0)
Neutrophils: 65 %
Platelets: 290 10*3/uL (ref 150–450)
RBC: 4.32 x10E6/uL (ref 3.77–5.28)
RDW: 12.6 % (ref 11.7–15.4)
WBC: 6.5 10*3/uL (ref 3.4–10.8)

## 2020-03-29 LAB — LIPID PANEL
Chol/HDL Ratio: 2.9 ratio (ref 0.0–4.4)
Cholesterol, Total: 147 mg/dL (ref 100–199)
HDL: 50 mg/dL (ref >39)
LDL Chol Calc (NIH): 70 mg/dL (ref 0–99)
Triglycerides: 156 mg/dL — ABNORMAL HIGH (ref 0–149)
VLDL Cholesterol Cal: 27 mg/dL (ref 5–40)

## 2020-03-29 LAB — COMPREHENSIVE METABOLIC PANEL
ALT: 36 IU/L — ABNORMAL HIGH (ref 0–32)
AST: 35 IU/L (ref 0–40)
Albumin/Globulin Ratio: 1.9 (ref 1.2–2.2)
Albumin: 4.6 g/dL (ref 3.7–4.7)
Alkaline Phosphatase: 80 IU/L (ref 44–121)
BUN/Creatinine Ratio: 14 (ref 12–28)
BUN: 14 mg/dL (ref 8–27)
Bilirubin Total: 0.3 mg/dL (ref 0.0–1.2)
CO2: 25 mmol/L (ref 20–29)
Calcium: 9.7 mg/dL (ref 8.7–10.3)
Chloride: 100 mmol/L (ref 96–106)
Creatinine, Ser: 0.99 mg/dL (ref 0.57–1.00)
GFR calc Af Amer: 66 mL/min/{1.73_m2} (ref >59)
GFR calc non Af Amer: 57 mL/min/{1.73_m2} — ABNORMAL LOW (ref >59)
Globulin, Total: 2.4 g/dL (ref 1.5–4.5)
Glucose: 118 mg/dL — ABNORMAL HIGH (ref 65–99)
Potassium: 4.7 mmol/L (ref 3.5–5.2)
Sodium: 139 mmol/L (ref 134–144)
Total Protein: 7 g/dL (ref 6.0–8.5)

## 2020-03-29 LAB — TSH+FREE T4
Free T4: 1.31 ng/dL (ref 0.82–1.77)
TSH: 2.16 u[IU]/mL (ref 0.450–4.500)

## 2020-04-14 ENCOUNTER — Encounter: Payer: Self-pay | Admitting: Internal Medicine

## 2020-04-14 DIAGNOSIS — R7303 Prediabetes: Secondary | ICD-10-CM | POA: Insufficient documentation

## 2020-04-14 LAB — HGB A1C W/O EAG: Hgb A1c MFr Bld: 6.5 % — ABNORMAL HIGH (ref 4.8–5.6)

## 2020-04-14 LAB — SPECIMEN STATUS REPORT

## 2020-04-23 DIAGNOSIS — J438 Other emphysema: Secondary | ICD-10-CM | POA: Diagnosis not present

## 2020-04-29 ENCOUNTER — Other Ambulatory Visit: Payer: Self-pay | Admitting: Internal Medicine

## 2020-05-06 ENCOUNTER — Ambulatory Visit: Payer: Medicare Other | Admitting: Obstetrics & Gynecology

## 2020-05-07 ENCOUNTER — Ambulatory Visit: Payer: Medicare Other

## 2020-05-08 ENCOUNTER — Other Ambulatory Visit: Payer: Self-pay | Admitting: Obstetrics & Gynecology

## 2020-05-08 NOTE — Telephone Encounter (Signed)
Sch f/u appt in April

## 2020-05-09 ENCOUNTER — Other Ambulatory Visit: Payer: Self-pay

## 2020-05-09 ENCOUNTER — Encounter: Payer: Self-pay | Admitting: Anesthesiology

## 2020-05-09 ENCOUNTER — Ambulatory Visit: Payer: Medicare Other | Attending: Anesthesiology | Admitting: Anesthesiology

## 2020-05-09 DIAGNOSIS — M545 Low back pain, unspecified: Secondary | ICD-10-CM

## 2020-05-09 DIAGNOSIS — G894 Chronic pain syndrome: Secondary | ICD-10-CM | POA: Diagnosis not present

## 2020-05-09 DIAGNOSIS — M797 Fibromyalgia: Secondary | ICD-10-CM | POA: Diagnosis not present

## 2020-05-09 DIAGNOSIS — M5441 Lumbago with sciatica, right side: Secondary | ICD-10-CM | POA: Diagnosis not present

## 2020-05-09 DIAGNOSIS — M5136 Other intervertebral disc degeneration, lumbar region: Secondary | ICD-10-CM | POA: Diagnosis not present

## 2020-05-09 DIAGNOSIS — G8929 Other chronic pain: Secondary | ICD-10-CM | POA: Diagnosis not present

## 2020-05-09 DIAGNOSIS — M51369 Other intervertebral disc degeneration, lumbar region without mention of lumbar back pain or lower extremity pain: Secondary | ICD-10-CM

## 2020-05-09 DIAGNOSIS — F1190 Opioid use, unspecified, uncomplicated: Secondary | ICD-10-CM

## 2020-05-09 DIAGNOSIS — M5442 Lumbago with sciatica, left side: Secondary | ICD-10-CM | POA: Diagnosis not present

## 2020-05-09 DIAGNOSIS — M47816 Spondylosis without myelopathy or radiculopathy, lumbar region: Secondary | ICD-10-CM

## 2020-05-09 DIAGNOSIS — M47812 Spondylosis without myelopathy or radiculopathy, cervical region: Secondary | ICD-10-CM

## 2020-05-09 MED ORDER — HYDROCODONE-ACETAMINOPHEN 5-325 MG PO TABS: 1.0000 | ORAL_TABLET | Freq: Two times a day (BID) | ORAL | 0 refills | Status: DC | PRN

## 2020-05-09 MED ORDER — DIAZEPAM 5 MG PO TABS: 5.0000 mg | ORAL_TABLET | Freq: Every day | ORAL | 2 refills | Status: DC

## 2020-05-09 NOTE — Telephone Encounter (Signed)
Called and left voicemail for patient to call back to be scheduled. 

## 2020-05-09 NOTE — Progress Notes (Signed)
Virtual Visit via Telephone Note  I connected with Carrie Myers on 05/09/20 at  2:00 PM EST by telephone and verified that I am speaking with the correct person using two identifiers.  Location: Patient: Home Provider: Pain control center   I discussed the limitations, risks, security and privacy concerns of performing an evaluation and management service by telephone and the availability of in person appointments. I also discussed with the patient that there may be a patient responsible charge related to this service. The patient expressed understanding and agreed to proceed.   History of Present Illness: I spoke with Carrie Myers today via telephone as she was unable to do the video portion of the virtual conference she reports that she has been doing well since her last epidural many months ago.  She felt like that was one of the best epidural she is received and she still having good pain relief.  She still gets some muscle spasm and some lower back pain but this is generally well managed with her Valium taken once at bedtime for muscle spasms and her hydrocodone.  She last filled her hydrocodone back in November of last year.  She is getting some more pain at this point and desires refill on that if possible.  She denies any diverting or illicit use and no side effects reported with either medication.  Otherwise she is in her usual state of health.  She is ambulating and trying to do some stretching strengthening exercises and these are generally working well for her.  No other changes are reported at this point.   Observations/Objective:   Current Outpatient Medications:  .  aspirin EC 81 MG tablet, Take 81 mg by mouth daily., Disp: , Rfl:  .  citalopram (CELEXA) 20 MG tablet, TAKE 1 TABLET BY MOUTH  DAILY, Disp: 90 tablet, Rfl: 3 .  [START ON 05/24/2020] diazepam (VALIUM) 5 MG tablet, Take 1 tablet (5 mg total) by mouth at bedtime., Disp: 30 tablet, Rfl: 2 .  Diclofenac-miSOPROStol 75-0.2  MG TBEC, TAKE 1 TABLET BY MOUTH TWICE DAILY, Disp: 180 tablet, Rfl: 1 .  DULoxetine (CYMBALTA) 60 MG capsule, TAKE 1 CAPSULE(60 MG) BY MOUTH DAILY, Disp: 30 capsule, Rfl: 0 .  esomeprazole (NEXIUM) 20 MG capsule, Take 40 mg by mouth daily. Over the Counter, Disp: , Rfl:  .  HYDROcodone-acetaminophen (NORCO/VICODIN) 5-325 MG tablet, Take 1 tablet by mouth 2 (two) times daily as needed for moderate pain. Limit 1-2 tablets by mouth per day if tolerated, Disp: 45 tablet, Rfl: 0 .  [START ON 06/07/2020] HYDROcodone-acetaminophen (NORCO/VICODIN) 5-325 MG tablet, Take 1 tablet by mouth 2 (two) times daily as needed for moderate pain., Disp: 45 tablet, Rfl: 0 .  levothyroxine (SYNTHROID) 88 MCG tablet, TAKE 1 TABLET BY MOUTH  DAILY, Disp: 90 tablet, Rfl: 3 .  Loperamide HCl (IMODIUM PO), Take by mouth as needed., Disp: , Rfl:  .  losartan (COZAAR) 50 MG tablet, Take 50 mg by mouth daily., Disp: , Rfl:  .  Multiple Vitamin (MULTI-VITAMINS) TABS, Take by mouth., Disp: , Rfl:  .  MYRBETRIQ 25 MG TB24 tablet, TAKE 1 TABLET(25 MG) BY MOUTH DAILY, Disp: 30 tablet, Rfl: 2 .  NON FORMULARY, , Disp: , Rfl:  .  pravastatin (PRAVACHOL) 80 MG tablet, Take 80 mg by mouth daily., Disp: , Rfl:  .  tolterodine (DETROL LA) 4 MG 24 hr capsule, TAKE 1 CAPSULE(4 MG) BY MOUTH DAILY, Disp: 90 capsule, Rfl: 3 Assessment and Plan: 1. Chronic  bilateral low back pain with bilateral sciatica   2. DDD (degenerative disc disease), lumbar   3. Chronic pain syndrome   4. Fibromyalgia   5. Chronic, continuous use of opioids   6. Chronic bilateral low back pain without sciatica   7. Facet syndrome, lumbar   8. Cervical facet joint syndrome   Based on our discussion today and upon review of the [facility] practitioner database information going to refill her medications.  First of all we will refill her Vicodin for today 45 tablets to be taken 1 twice daily as needed.  This will be for 225 and 326.  We will also refill her Valium  and schedule her for 59-month return to clinic.  We have gone over some stretching strengthening exercises to consider and will defer any repeat injections today.  She is scheduled for the 83-month return and if she should desire to proceed with a repeat epidural between now then she is instructed to contact the pain control center accordingly.  She is to continue follow-up with her primary care physicians for her baseline medical care.  Follow Up Instructions:    I discussed the assessment and treatment plan with the patient. The patient was provided an opportunity to ask questions and all were answered. The patient agreed with the plan and demonstrated an understanding of the instructions.   The patient was advised to call back or seek an in-person evaluation if the symptoms worsen or if the condition fails to improve as anticipated.  I provided 30 minutes of non-face-to-face time during this encounter.   Molli Barrows, MD

## 2020-05-12 ENCOUNTER — Ambulatory Visit (INDEPENDENT_AMBULATORY_CARE_PROVIDER_SITE_OTHER): Payer: Medicare Other

## 2020-05-12 DIAGNOSIS — Z780 Asymptomatic menopausal state: Secondary | ICD-10-CM | POA: Diagnosis not present

## 2020-05-12 DIAGNOSIS — Z0000 Encounter for general adult medical examination without abnormal findings: Secondary | ICD-10-CM | POA: Diagnosis not present

## 2020-05-12 NOTE — Patient Instructions (Signed)
[name] , Thank you for taking time to come for your Medicare Wellness Visit. I appreciate your ongoing commitment to your health goals. Please review the following plan we discussed and let me know if I can assist you in the future.   Screening recommendations/referrals: Colonoscopy: done 04/03/18 Mammogram: done 05/22/19. Bone Density: done 05/10/18. Please call [phone] to schedule your bone density screening.  Recommended yearly ophthalmology/optometry visit for glaucoma screening and checkup Recommended yearly dental visit for hygiene and checkup  Vaccinations: Influenza vaccine: done 03/28/20 Pneumococcal vaccine: done 01/30/14 Tdap vaccine: done 09/08/11 Shingles vaccine: Shingrix discussed. Please contact your pharmacy for coverage information.  Covid-19: done 11/09/19 & 12/13/19    Advanced directives: Please bring a copy of your health care power of attorney and living will to the office at your convenience.  Conditions/risks identified: Recommend increasing physical activity   Next appointment: Follow up in one year for your annual wellness visit    Preventive Care 65 Years and Older, Female Preventive care refers to lifestyle choices and visits with your health care provider that can promote health and wellness. What does preventive care include?  A yearly physical exam. This is also called an annual well check.  Dental exams once or twice a year.  Routine eye exams. Ask your health care provider how often you should have your eyes checked.  Personal lifestyle choices, including:  Daily care of your teeth and gums.  Regular physical activity.  Eating a healthy diet.  Avoiding tobacco and drug use.  Limiting alcohol use.  Practicing safe sex.  Taking low-dose aspirin every day.  Taking vitamin and mineral supplements as recommended by your health care provider. What happens during an annual well check? The services and screenings done by your health  care provider during your annual well check will depend on your age, overall health, lifestyle risk factors, and family history of disease. Counseling  Your health care provider may ask you questions about your:  Alcohol use.  Tobacco use.  Drug use.  Emotional well-being.  Home and relationship well-being.  Sexual activity.  Eating habits.  History of falls.  Memory and ability to understand (cognition).  Work and work Statistician.  Reproductive health. Screening  You may have the following tests or measurements:  Height, weight, and BMI.  Blood pressure.  Lipid and cholesterol levels. These may be checked every 5 years, or more frequently if you are over 27 years old.  Skin check.  Lung cancer screening. You may have this screening every year starting at age 107 if you have a 30-pack-year history of smoking and currently smoke or have quit within the past 15 years.  Fecal occult blood test (FOBT) of the stool. You may have this test every year starting at age 35.  Flexible sigmoidoscopy or colonoscopy. You may have a sigmoidoscopy every 5 years or a colonoscopy every 10 years starting at age 59.  Hepatitis C blood test.  Hepatitis B blood test.  Sexually transmitted disease (STD) testing.  Diabetes screening. This is done by checking your blood sugar (glucose) after you have not eaten for a while (fasting). You may have this done every 1-3 years.  Bone density scan. This is done to screen for osteoporosis. You may have this done starting at age 73.  Mammogram. This may be done every 1-2 years. Talk to your health care provider about how often you should have regular mammograms. Talk with your health care provider about your test results, treatment options,  and if necessary, the need for more tests. Vaccines  Your health care provider may recommend certain vaccines, such as:  Influenza vaccine. This is recommended every year.  Tetanus, diphtheria, and  acellular pertussis (Tdap, Td) vaccine. You may need a Td booster every 10 years.  Zoster vaccine. You may need this after age 41.  Pneumococcal 13-valent conjugate (PCV13) vaccine. One dose is recommended after age 19.  Pneumococcal polysaccharide (PPSV23) vaccine. One dose is recommended after age 21. Talk to your health care provider about which screenings and vaccines you need and how often you need them. This information is not intended to replace advice given to you by your health care provider. Make sure you discuss any questions you have with your health care provider. Document Released: 03/28/2015 Document Revised: 11/19/2015 Document Reviewed: 12/31/2014 Elsevier Interactive Patient Education  2017 [place] Prevention in the Home Falls can cause injuries. They can happen to people of all ages. There are many things you can do to make your home safe and to help prevent falls. What can I do on the outside of my home?  Regularly fix the edges of walkways and driveways and fix any cracks.  Remove anything that might make you trip as you walk through a door, such as a raised step or threshold.  Trim any bushes or trees on the path to your home.  Use bright outdoor lighting.  Clear any walking paths of anything that might make someone trip, such as rocks or tools.  Regularly check to see if handrails are loose or broken. Make sure that both sides of any steps have handrails.  Any raised decks and porches should have guardrails on the edges.  Have any leaves, snow, or ice cleared regularly.  Use sand or salt on walking paths during winter.  Clean up any spills in your garage right away. This includes oil or grease spills. What can I do in the bathroom?  Use night lights.  Install grab bars by the toilet and in the tub and shower. Do not use towel bars as grab bars.  Use non-skid mats or decals in the tub or shower.  If you need to sit down in the shower, use  a plastic, non-slip stool.  Keep the floor dry. Clean up any water that spills on the floor as soon as it happens.  Remove soap buildup in the tub or shower regularly.  Attach bath mats securely with double-sided non-slip rug tape.  Do not have throw rugs and other things on the floor that can make you trip. What can I do in the bedroom?  Use night lights.  Make sure that you have a light by your bed that is easy to reach.  Do not use any sheets or blankets that are too big for your bed. They should not hang down onto the floor.  Have a firm chair that has side arms. You can use this for support while you get dressed.  Do not have throw rugs and other things on the floor that can make you trip. What can I do in the kitchen?  Clean up any spills right away.  Avoid walking on wet floors.  Keep items that you use a lot in easy-to-reach places.  If you need to reach something above you, use a strong step stool that has a grab bar.  Keep electrical cords out of the way.  Do not use floor polish or wax that makes floors slippery. If  you must use wax, use non-skid floor wax.  Do not have throw rugs and other things on the floor that can make you trip. What can I do with my stairs?  Do not leave any items on the stairs.  Make sure that there are handrails on both sides of the stairs and use them. Fix handrails that are broken or loose. Make sure that handrails are as long as the stairways.  Check any carpeting to make sure that it is firmly attached to the stairs. Fix any carpet that is loose or worn.  Avoid having throw rugs at the top or bottom of the stairs. If you do have throw rugs, attach them to the floor with carpet tape.  Make sure that you have a light switch at the top of the stairs and the bottom of the stairs. If you do not have them, ask someone to add them for you. What else can I do to help prevent falls?  Wear shoes that:  Do not have high heels.  Have  rubber bottoms.  Are comfortable and fit you well.  Are closed at the toe. Do not wear sandals.  If you use a stepladder:  Make sure that it is fully opened. Do not climb a closed stepladder.  Make sure that both sides of the stepladder are locked into place.  Ask someone to hold it for you, if possible.  Clearly mark and make sure that you can see:  Any grab bars or handrails.  First and last steps.  Where the edge of each step is.  Use tools that help you move around (mobility aids) if they are needed. These include:  Canes.  Walkers.  Scooters.  Crutches.  Turn on the lights when you go into a dark area. Replace any light bulbs as soon as they burn out.  Set up your furniture so you have a clear path. Avoid moving your furniture around.  If any of your floors are uneven, fix them.  If there are any pets around you, be aware of where they are.  Review your medicines with your doctor. Some medicines can make you feel dizzy. This can increase your chance of falling. Ask your doctor what other things that you can do to help prevent falls. This information is not intended to replace advice given to you by your health care provider. Make sure you discuss any questions you have with your health care provider. Document Released: 12/26/2008 Document Revised: 08/07/2015 Document Reviewed: 04/05/2014 Elsevier Interactive Patient Education  2017 [employer].

## 2020-05-12 NOTE — Progress Notes (Signed)
Subjective:   Carrie Myers is a 75 y.o. female who presents for Medicare Annual (Subsequent) preventive examination.  Virtual Visit via Telephone Note  I connected with  Carrie Myers on 05/12/20 at  1:20 PM EST by telephone and verified that I am speaking with the correct person using two identifiers.  Location: Patient: home Provider: [facility] Persons participating in the virtual visit: [place]   I discussed the limitations, risks, security and privacy concerns of performing an evaluation and management service by telephone and the availability of in person appointments. The patient expressed understanding and agreed to proceed.  Interactive audio and video telecommunications were attempted between this nurse and patient, however failed, due to patient having technical difficulties OR patient did not have access to video capability.  We continued and completed visit with audio only.  Some vital signs may be absent or patient reported.   Clemetine Marker, LPN    Review of Systems     Cardiac Risk Factors include: advanced age (>29men, >28 women);hypertension;dyslipidemia;obesity (BMI >30kg/m2)     Objective:    Today's Vitals   05/12/20 1326  PainSc: 3    There is no height or weight on file to calculate BMI.  Advanced Directives 05/12/2020 02/11/2020 01/02/2020 05/07/2019 02/20/2019 01/01/2019 04/17/2018  Does Patient Have a Medical Advance Directive? Yes No No Yes Yes Yes Yes  Type of Paramedic of [place];Living will - - [place];Living will - [address];Living will [place];Living will  Does patient want to make changes to medical advance directive? - - - - - - -  Copy of [place] in Chart? No - copy requested - - No - copy requested - - Yes - validated most recent copy scanned in chart (See row information)    Current Medications  (verified) Outpatient Encounter Medications as of 05/12/2020  Medication Sig  . aspirin EC 81 MG tablet Take 81 mg by mouth daily.  . citalopram (CELEXA) 20 MG tablet TAKE 1 TABLET BY MOUTH  DAILY  . COMBIVENT RESPIMAT 20-100 MCG/ACT AERS respimat SMARTSIG:2 Inhalation Via Inhaler 4 Times Daily PRN  . [START ON 05/24/2020] diazepam (VALIUM) 5 MG tablet Take 1 tablet (5 mg total) by mouth at bedtime.  . Diclofenac-miSOPROStol 75-0.2 MG TBEC TAKE 1 TABLET BY MOUTH TWICE DAILY  . DULoxetine (CYMBALTA) 60 MG capsule TAKE 1 CAPSULE(60 MG) BY MOUTH DAILY  . esomeprazole (NEXIUM) 20 MG capsule Take 40 mg by mouth daily. Over the Counter  . HYDROcodone-acetaminophen (NORCO/VICODIN) 5-325 MG tablet Take 1 tablet by mouth 2 (two) times daily as needed for moderate pain. Limit 1-2 tablets by mouth per day if tolerated  . [START ON 06/07/2020] HYDROcodone-acetaminophen (NORCO/VICODIN) 5-325 MG tablet Take 1 tablet by mouth 2 (two) times daily as needed for moderate pain.  Marland Kitchen levothyroxine (SYNTHROID) 88 MCG tablet TAKE 1 TABLET BY MOUTH  DAILY  . Loperamide HCl (IMODIUM PO) Take by mouth as needed.  Marland Kitchen losartan (COZAAR) 50 MG tablet Take 50 mg by mouth daily.  . Multiple Vitamin (MULTI-VITAMINS) TABS Take by mouth.  Marland Kitchen MYRBETRIQ 25 MG TB24 tablet TAKE 1 TABLET(25 MG) BY MOUTH DAILY  . pravastatin (PRAVACHOL) 80 MG tablet Take 80 mg by mouth daily.  Marland Kitchen tolterodine (DETROL LA) 4 MG 24 hr capsule TAKE 1 CAPSULE(4 MG) BY MOUTH DAILY  . [DISCONTINUED] NON FORMULARY    No facility-administered encounter medications on file as of 05/12/2020.  Allergies (verified) Augmentin [amoxicillin-pot clavulanate], Codeine, Sulfa antibiotics, Proparacaine, and Shellfish allergy   History: Past Medical History:  Diagnosis Date  . Allergy   . Anxiety   . Arthritis    neck, hands, lower back  . Bilateral dry eyes   . Chronic low back pain 12/23/2016  . DDD (degenerative disc disease), lumbar 10/02/2014  . Dental crowns  present    Implants, front "flipper" while waiting for other implants  . Fibromyalgia   . Fibromyalgia   . Fracture of right foot 01/14/2015  . GERD (gastroesophageal reflux disease)   . Hyperlipidemia   . Hypertension   . Kidney stones   . Myocardial infarction ([place]) 04/22/2006  . Osteoarthritis   . Other shoulder lesions, right shoulder 04/17/2015  . Rheumatoid arthritis ([place])   . Sciatica, left side 01/01/2019  . Sleep apnea    CPAP  . Status post shoulder surgery 05/27/2015  . Thyroid disease   . Vertigo    none for approx 9 yrs   Past Surgical History:  Procedure Laterality Date  . ABDOMINAL HYSTERECTOMY     total  . APPENDECTOMY    . BREAST BIOPSY Right    neg  . BREAST CYST ASPIRATION Left    neg  . BUNIONECTOMY Bilateral   . CESAREAN SECTION    . COLONOSCOPY  2010   normal  . COLONOSCOPY WITH PROPOFOL N/A 04/03/2018   Procedure: COLONOSCOPY WITH BIOPSIES;  Surgeon: Lucilla Lame, MD;  Location: [facility];  Service: Endoscopy;  Laterality: N/A;  sleep apnea  . CORONARY ANGIOPLASTY WITH STENT PLACEMENT  04/2006  . dental implant     [age]  . epidural steroid injection  2018  . SHOULDER ARTHROSCOPY Right 05/07/2015   Procedure: Extensive arthroscopic debridement and arthroscopic subacromial decompression, right shoulder.  2. Steroid injection right thumb CMC joint.;  Surgeon: Corky Mull, MD;  Location: [facility];  Service: Orthopedics;  Laterality: Right;  . TOOTH EXTRACTION     Upper centrals and lateral   Family History  Problem Relation Age of Onset  . Stroke Mother   . Heart disease Father   . Pancreatic cancer Sister   . Diabetes Sister   . Prostate cancer Brother   . Breast cancer Neg Hx   . Kidney cancer Neg Hx   . Bladder Cancer Neg Hx    Social History   Socioeconomic History  . Marital status: Widowed    Spouse name: Not on file  . Number of children: 1  . Years of education: Not on file  . Highest  education level: High school graduate  Occupational History  . Occupation: retired   Tobacco Use  . Smoking status: Former Smoker    Packs/day: 1.25    Years: 44.00    Pack years: 55.00    Types: Cigarettes    Quit date: 04/22/2006    Years since quitting: 14.0  . Smokeless tobacco: Never Used  . Tobacco comment:    Vaping Use  . Vaping Use: Never used  Substance and Sexual Activity  . Alcohol use: No    Alcohol/week: 0.0 standard drinks  . Drug use: No  . Sexual activity: Not Currently  Other Topics Concern  . Not on file  Social History Narrative   Pt lives alone    Social Determinants of Health   Financial Resource Strain: Low Risk   . Difficulty of Paying Living Expenses: Not hard at all  Food Insecurity: No Food Insecurity  .  Worried About Charity fundraiser in the Last Year: Never true  . Ran Out of Food in the Last Year: Never true  Transportation Needs: No Transportation Needs  . Lack of Transportation (Medical): No  . Lack of Transportation (Non-Medical): No  Physical Activity: Inactive  . Days of Exercise per Week: 0 days  . Minutes of Exercise per Session: 0 min  Stress: No Stress Concern Present  . Feeling of Stress : Not at all  Social Connections: Moderately Isolated  . Frequency of Communication with Friends and Family: More than three times a week  . Frequency of Social Gatherings with Friends and Family: Three times a week  . Attends Religious Services: More than 4 times per year  . Active Member of Clubs or Organizations: No  . Attends Archivist Meetings: Never  . Marital Status: Widowed    Tobacco Counseling Counseling given: Not Answered Comment:     Clinical Intake:  Pre-visit preparation completed: Yes  Pain : 0-10 Pain Score: 3  Pain Type: Chronic pain Pain Location: Generalized (fibromyalgia, arthritis) Pain Onset: More than a month ago Pain Frequency: Constant     Nutritional Risks: None Diabetes: No  How often  do you need to have someone help you when you read instructions, pamphlets, or other written materials from your doctor or pharmacy?: 1 - Never   Interpreter Needed?: No  Information entered by :: Clemetine Marker LPN   Activities of Daily Living In your present state of health, do you have any difficulty performing the following activities: 05/12/2020  Hearing? N  Comment declines hearing aids  Vision? N  Difficulty concentrating or making decisions? N  Walking or climbing stairs? Y  Dressing or bathing? N  Doing errands, shopping? N  Preparing Food and eating ? N  Using the Toilet? N  In the past six months, have you accidently leaked urine? Y  Comment wears pads for protection  Do you have problems with loss of bowel control? N  Managing your Medications? N  Managing your Finances? N  Housekeeping or managing your Housekeeping? N  Some recent data might be hidden    Patient Care Team: [name], MD as PCP - General (Internal Medicine) Corey Skains, MD as Consulting Physician (Cardiology) Molli Barrows, MD as Consulting Physician (Pain Medicine) Laneta Simmers as Physician Assistant (Urology) Ellwood Handler., MD (Gastroenterology) Ralene Bathe, MD (Dermatology) Barnett Applebaum (Obstetrics and Gynecology) Beverly Gust, MD (Otolaryngology) Molli Barrows, MD as Consulting Physician (Anesthesiology) Ottie Glazier, MD as Consulting Physician (Pulmonary Disease)  Indicate any recent Medical Services you may have received from other than [facility] providers in the past year (date may be approximate).     Assessment:   This is a routine wellness examination for [place].  Hearing/Vision screen  Hearing Screening   125Hz  250Hz  500Hz  1000Hz  2000Hz  3000Hz  4000Hz  6000Hz  8000Hz   Right ear:           Left ear:           Comments: Pt denies hearing difficulty  Vision Screening Comments: Annual vision screening scheduled with [facility] Edison [facility]  Dietary issues and exercise activities discussed: Current Exercise Habits: The patient does not participate in regular exercise at present, Exercise limited by: respiratory conditions(s);orthopedic condition(s)  Goals    . Increase physical activity     Increase physical activity to 150 minutes per week.     . Weight (lb) <  200 lb (90.7 kg)      Depression Screen PHQ 2/9 Scores 05/12/2020 03/28/2020 01/02/2020 12/07/2019 07/18/2019 06/05/2019 05/07/2019  PHQ - 2 Score 0 0 0 0 0 0 0  PHQ- 9 Score - 0 - 0 0 0 -  Exception Documentation - - - - - - -    Fall Risk Fall Risk  05/12/2020 03/28/2020 01/02/2020 12/07/2019 07/18/2019  Falls in the past year? 0 0 0 0 1  Comment - - - - -  Number falls in past yr: 0 - - 0 0  Comment - - - - -  Injury with Fall? 0 - - 0 0  Risk for fall due to : No Fall Risks - - No Fall Risks History of fall(s)  Follow up Falls prevention discussed Falls evaluation completed - Falls evaluation completed Falls evaluation completed;Falls prevention discussed    FALL RISK PREVENTION PERTAINING TO THE HOME:  Any stairs in or around the home? No  If so, are there any without handrails? No  Home free of loose throw rugs in walkways, pet beds, electrical cords, etc? Yes  Adequate lighting in your home to reduce risk of falls? Yes   ASSISTIVE DEVICES UTILIZED TO PREVENT FALLS:  Life alert? No  Use of a cane, walker or w/c? Yes  Grab bars in the bathroom? No  Shower chair or bench in shower? No  Elevated toilet seat or a handicapped toilet? No   TIMED UP AND GO:  Was the test performed? No . Telephonic visit.   Cognitive Function: Normal cognitive status assessed by direct observation by this Nurse Health Advisor. No abnormalities found.       6CIT Screen 05/07/2019 04/17/2018 03/18/2017 03/16/2016  What Year? 0 points 0 points 0 points 0 points  What month? 0 points 0 points 0 points 0 points  What time? 0 points 0 points 0 points 0 points  Count back from  20 0 points 0 points 0 points 0 points  Months in reverse 0 points 0 points 0 points 0 points  Repeat phrase 0 points 0 points 2 points 0 points  Total Score 0 0 2 0    Immunizations Immunization History  Administered Date(s) Administered  . Fluad Quad(high Dose 65+) 03/28/2019, 03/28/2020  . Influenza, High Dose Seasonal PF 12/31/2016, 12/05/2017  . Moderna Sars-Covid-2 Vaccination 11/09/2019, 12/13/2019  . Pneumococcal Conjugate-13 01/30/2014  . Pneumococcal Polysaccharide-23 09/22/2012  . Tdap 09/08/2011    TDAP status: Up to date  Flu Vaccine status: Up to date  Pneumococcal vaccine status: Up to date  Covid-19 vaccine status: Completed vaccines  Qualifies for Shingles Vaccine? Yes   Zostavax completed No   Shingrix Completed?: No.    Education has been provided regarding the importance of this vaccine. Patient has been advised to call insurance company to determine out of pocket expense if they have not yet received this vaccine. Advised may also receive vaccine at local pharmacy or Health Dept. Verbalized acceptance and understanding.  Screening Tests Health Maintenance  Topic Date Due  . MAMMOGRAM  05/21/2020  . COVID-19 Vaccine (3 - Booster for Moderna series) 06/11/2020  . TETANUS/TDAP  09/07/2021  . COLONOSCOPY (Pts 45-27yrs Insurance coverage will need to be confirmed)  04/03/2028  . INFLUENZA VACCINE  Completed  . DEXA SCAN  Completed  . Hepatitis C Screening  Completed  . PNA vac Low Risk Adult  Completed    Health Maintenance  There are no preventive care reminders to display for  this patient.  Colorectal cancer screening: Type of screening: Colonoscopy. Completed 04/03/18. Repeat every 2 years  Mammogram status: Completed 05/22/19. Repeat every year  Bone Density status: Completed 05/10/18. Results reflect: Bone density results: OSTEOPENIA. Repeat every 2 years.  Lung Cancer Screening: (Low Dose CT Chest recommended if Age 37-80 years, 30 pack-year  currently smoking OR have quit w/in 15years.) does qualify. Completed 06/14/19.  Additional Screening:  Hepatitis C Screening: does qualify; Completed 2018.   Vision Screening: Recommended annual ophthalmology exams for early detection of glaucoma and other disorders of the eye. Is the patient up to date with their annual eye exam?  Yes  Who is the provider or what is the name of the office in which the patient attends annual eye exams? Future appt with Dr. Edison Pace  Dental Screening: Recommended annual dental exams for proper oral hygiene  Community Resource Referral / Chronic Care Management: CRR required this visit?  No   CCM required this visit?  No      Plan:     I have personally reviewed and noted the following in the patient's chart:   . Medical and social history . Use of alcohol, tobacco or illicit drugs  . Current medications and supplements . Functional ability and status . Nutritional status . Physical activity . Advanced directives . List of other physicians . Hospitalizations, surgeries, and ER visits in previous 12 months . Vitals . Screenings to include cognitive, depression, and falls . Referrals and appointments  In addition, I have reviewed and discussed with patient certain preventive protocols, quality metrics, and best practice recommendations. A written personalized care plan for preventive services as well as general preventive health recommendations were provided to patient.     Clemetine Marker, LPN   [id number]   Nurse Notes: none

## 2020-05-19 DIAGNOSIS — K219 Gastro-esophageal reflux disease without esophagitis: Secondary | ICD-10-CM | POA: Diagnosis not present

## 2020-05-19 DIAGNOSIS — H6120 Impacted cerumen, unspecified ear: Secondary | ICD-10-CM | POA: Diagnosis not present

## 2020-05-19 DIAGNOSIS — R059 Cough, unspecified: Secondary | ICD-10-CM | POA: Diagnosis not present

## 2020-05-22 ENCOUNTER — Ambulatory Visit: Payer: Medicare Other

## 2020-05-25 ENCOUNTER — Telehealth: Payer: Self-pay | Admitting: *Deleted

## 2020-05-25 NOTE — Telephone Encounter (Signed)
Left a voicemail to inform patient it is time to schedule her annual lung screening CT scan. Instructed her to call back to update her information and to get her CT scan scheduled.

## 2020-05-26 ENCOUNTER — Telehealth: Payer: Self-pay | Admitting: *Deleted

## 2020-05-26 NOTE — Telephone Encounter (Signed)
Patient scheduled for lung screening CT 07/10/2020 2:00PM, no change in insurance, smoking quit smoking 14 years ago.

## 2020-05-27 ENCOUNTER — Other Ambulatory Visit: Payer: Self-pay | Admitting: *Deleted

## 2020-05-27 ENCOUNTER — Other Ambulatory Visit: Payer: Medicare Other

## 2020-05-27 DIAGNOSIS — Z87891 Personal history of nicotine dependence: Secondary | ICD-10-CM

## 2020-05-27 DIAGNOSIS — Z122 Encounter for screening for malignant neoplasm of respiratory organs: Secondary | ICD-10-CM

## 2020-05-27 NOTE — Progress Notes (Signed)
Contacted and scheduled for annual lung screening scan. Patient is a former smoker, quit 05-01-2047, 55 pack year history.

## 2020-05-28 ENCOUNTER — Other Ambulatory Visit: Payer: Self-pay

## 2020-05-28 ENCOUNTER — Ambulatory Visit
Admission: RE | Admit: 2020-05-28 | Discharge: 2020-05-28 | Disposition: A | Discharge status: Home / Self Care | Payer: Medicare Other | Source: Ambulatory Visit | Attending: Internal Medicine | Admitting: Internal Medicine

## 2020-05-28 DIAGNOSIS — M85851 Other specified disorders of bone density and structure, right thigh: Secondary | ICD-10-CM | POA: Diagnosis not present

## 2020-05-28 DIAGNOSIS — Z780 Asymptomatic menopausal state: Secondary | ICD-10-CM | POA: Diagnosis not present

## 2020-05-29 ENCOUNTER — Telehealth: Payer: Self-pay

## 2020-05-29 MED ORDER — HYDROCODONE-ACETAMINOPHEN 5-325 MG PO TABS: 1.0000 | ORAL_TABLET | Freq: Two times a day (BID) | ORAL | 0 refills | Status: DC | PRN

## 2020-05-29 MED ORDER — DIAZEPAM 5 MG PO TABS: 5.0000 mg | ORAL_TABLET | Freq: Every day | ORAL | 2 refills | Status: AC

## 2020-05-29 NOTE — Addendum Note (Signed)
Addended by: Molli Barrows on: 05/29/2020 12:46 PM   Modules accepted: Orders

## 2020-05-29 NOTE — Telephone Encounter (Signed)
Patient called and states she now goes to [facility] and the prescriptions went to [facility].  She would like for you to resend her Hydrocodone and Valium to [facility] please.

## 2020-06-03 ENCOUNTER — Ambulatory Visit: Payer: Medicare Other

## 2020-06-03 DIAGNOSIS — H04123 Dry eye syndrome of bilateral lacrimal glands: Secondary | ICD-10-CM | POA: Diagnosis not present

## 2020-06-10 ENCOUNTER — Ambulatory Visit
Admission: RE | Admit: 2020-06-10 | Discharge: 2020-06-10 | Disposition: A | Discharge status: Home / Self Care | Payer: Medicare Other | Source: Ambulatory Visit | Attending: Internal Medicine | Admitting: Internal Medicine

## 2020-06-10 ENCOUNTER — Other Ambulatory Visit: Payer: Self-pay

## 2020-06-10 DIAGNOSIS — Z1231 Encounter for screening mammogram for malignant neoplasm of breast: Secondary | ICD-10-CM | POA: Diagnosis not present

## 2020-06-11 ENCOUNTER — Other Ambulatory Visit: Payer: Self-pay | Admitting: Internal Medicine

## 2020-06-11 DIAGNOSIS — M797 Fibromyalgia: Secondary | ICD-10-CM

## 2020-06-11 MED ORDER — DICLOFENAC-MISOPROSTOL 75-0.2 MG PO TBEC: 1.0000 | DELAYED_RELEASE_TABLET | Freq: Two times a day (BID) | ORAL | 1 refills | Status: DC

## 2020-06-11 NOTE — Telephone Encounter (Signed)
Medication Refill - Medication: Diclofenac Misoprostol   Has the patient contacted their pharmacy? No. Pt is using a new pharmacy and is needing to have a new prescription sent to the pharmacy.  Please advise.  (Agent: If no, request that the patient contact the pharmacy for the refill.) (Agent: If yes, when and what did the pharmacy advise?)  Preferred Pharmacy (with phone number or street name):  [place] [place] [address]  [place] [address]  Phone: [phone] Fax: [phone]  Hours: Not open 24 hours     Agent: Please be advised that RX refills may take up to 3 business days. We ask that you follow-up with your pharmacy.

## 2020-06-11 NOTE — Telephone Encounter (Signed)
Requested medication (s) are due for refill today: yes  Requested medication (s) are on the active medication list: yes  Last refill: 02/01/20  Future visit scheduled:  yes  Notes to clinic:  not delegated    Requested Prescriptions  Pending Prescriptions Disp Refills   Diclofenac-miSOPROStol 75-0.2 MG TBEC 180 tablet 1    Sig: Take 1 tablet by mouth 2 (two) times daily.      Not Delegated - Analgesics:  Antirheumatic Agents - diclofenac/misoprostol Failed - 06/11/2020 12:43 PM      Failed - This refill cannot be delegated      Passed - Cr in normal range and within 360 days    Creatinine, Ser  Date Value Ref Range Status  03/28/2020 0.99 0.57 - 1.00 mg/dL Final          Passed - HGB in normal range and within 360 days    Hemoglobin  Date Value Ref Range Status  03/28/2020 13.2 11.1 - 15.9 g/dL Final          Passed - Patient is not pregnant      Passed - Valid encounter within last 12 months    Recent Outpatient Visits           2 months ago Benign essential HTN   [place], [place]   6 months ago Acute non-recurrent maxillary sinusitis   [facility] [name], MD   10 months ago [facility] [name], MD   1 year ago Acute cystitis without hematuria   [facility] [name], MD   1 year ago COVID-19 virus infection   [facility] [name], MD       Future Appointments             In 1 month Army Melia Jesse Sans, MD [facility], [place]   In 9 months Army Melia, Jesse Sans, MD [facility], [facility]

## 2020-06-18 ENCOUNTER — Ambulatory Visit (INDEPENDENT_AMBULATORY_CARE_PROVIDER_SITE_OTHER): Payer: Medicare Other | Admitting: Obstetrics & Gynecology

## 2020-06-18 ENCOUNTER — Encounter: Payer: Self-pay | Admitting: Obstetrics & Gynecology

## 2020-06-18 ENCOUNTER — Other Ambulatory Visit: Payer: Self-pay

## 2020-06-18 VITALS — BP 100/60 | Ht 68.0 in | Wt 180.0 lb

## 2020-06-18 DIAGNOSIS — N3281 Overactive bladder: Secondary | ICD-10-CM | POA: Insufficient documentation

## 2020-06-18 DIAGNOSIS — R3915 Urgency of urination: Secondary | ICD-10-CM | POA: Diagnosis not present

## 2020-06-18 MED ORDER — MIRABEGRON ER 50 MG PO TB24: 50.0000 mg | ORAL_TABLET | Freq: Every day | ORAL | 11 refills | Status: DC

## 2020-06-18 NOTE — Progress Notes (Signed)
History of Present Illness:  Carrie Myers is a 75 y.o. who was started on Myrbetriq 25 mg daily approximately 12 months ago. Since that time, she states that her symptoms are improving.  She has no further Nocturia, yet still has urgency symptoms during the day.  Rare LOU.  Has delayed emptying when she voids.  PMHx: She  has a past medical history of Allergy, Anxiety, Arthritis, Bilateral dry eyes, Chronic low back pain (12/23/2016), DDD (degenerative disc disease), lumbar (10/02/2014), Dental crowns present, Emphysema of lung ([place]), Fibromyalgia, Fibromyalgia, Fracture of right foot (01/14/2015), GERD (gastroesophageal reflux disease), Hyperlipidemia, Hypertension, Kidney stones, Myocardial infarction ([place]) (04/22/2006), Osteoarthritis, Other shoulder lesions, right shoulder (04/17/2015), Rheumatoid arthritis ([place]), Sciatica, left side (01/01/2019), Sleep apnea, Status post shoulder surgery (05/27/2015), Thyroid disease, and Vertigo. Also,  has a past surgical history that includes Appendectomy; Cesarean section; Abdominal hysterectomy; Bunionectomy (Bilateral); dental implant; Colonoscopy (2010); Coronary angioplasty with stent (04/2006); Shoulder arthroscopy (Right, 05/07/2015); epidural steroid injection (2018); Tooth extraction; Colonoscopy with propofol (N/A, 04/03/2018); Eye surgery; Breast biopsy (Right); and Breast cyst aspiration (Left)., family history includes Diabetes in her sister; Heart disease in her father; Pancreatic cancer in her sister; Prostate cancer in her brother; Stroke in her mother.,  reports that she quit smoking about 14 years ago. Her smoking use included cigarettes. She has a 55.00 pack-year smoking history. She has never used smokeless tobacco. She reports that she does not drink alcohol and does not use drugs. Current Meds  Medication Sig  . aspirin EC 81 MG tablet Take 81 mg by mouth daily.  . citalopram (CELEXA) 20 MG tablet TAKE 1 TABLET BY MOUTH  DAILY  . COMBIVENT  RESPIMAT 20-100 MCG/ACT AERS respimat SMARTSIG:2 Inhalation Via Inhaler 4 Times Daily PRN  . diazepam (VALIUM) 5 MG tablet Take 1 tablet (5 mg total) by mouth at bedtime.  . Diclofenac-miSOPROStol 75-0.2 MG TBEC Take 1 tablet by mouth 2 (two) times daily.  . DULoxetine (CYMBALTA) 60 MG capsule TAKE 1 CAPSULE(60 MG) BY MOUTH DAILY  . esomeprazole (NEXIUM) 20 MG capsule Take 40 mg by mouth daily. Over the Counter  . HYDROcodone-acetaminophen (NORCO/VICODIN) 5-325 MG tablet Take 1 tablet by mouth 2 (two) times daily as needed for moderate pain. Limit 1-2 tablets by mouth per day if tolerated  . [START ON 07/12/2020] HYDROcodone-acetaminophen (NORCO/VICODIN) 5-325 MG tablet Take 1 tablet by mouth 2 (two) times daily as needed for moderate pain.  Marland Kitchen levothyroxine (SYNTHROID) 88 MCG tablet TAKE 1 TABLET BY MOUTH  DAILY  . Loperamide HCl (IMODIUM PO) Take by mouth as needed.  Marland Kitchen losartan (COZAAR) 50 MG tablet Take 50 mg by mouth daily.  . Multiple Vitamin (MULTI-VITAMINS) TABS Take by mouth.  Marland Kitchen MYRBETRIQ 25 MG TB24 tablet TAKE 1 TABLET(25 MG) BY MOUTH DAILY  . pravastatin (PRAVACHOL) 80 MG tablet Take 80 mg by mouth daily.  Marland Kitchen tolterodine (DETROL LA) 4 MG 24 hr capsule TAKE 1 CAPSULE(4 MG) BY MOUTH DAILY  . Also, is allergic to augmentin [amoxicillin-pot clavulanate], codeine, sulfa antibiotics, proparacaine, and shellfish allergy..  Review of Systems  Genitourinary: Positive for urgency.  All other systems reviewed and are negative.   Physical Exam:  BP 100/60   Ht 5\' 8"  (1.727 m)   Wt 180 lb (81.6 kg)   BMI 27.37 kg/m  Body mass index is 27.37 kg/m. Constitutional: Well nourished, well developed female in no acute distress.  Abdomen: diffusely non tender to palpation, non distended, and no masses, hernias Neuro: Grossly  intact Psych:  Normal mood and affect.    Assessment:  Problem List Items Addressed This Visit      Genitourinary   Overactive bladder   Urinary urgency         Medication treatment is going well for her OAB.  Plan: She will undergo dosage change to 50 mg daily of Myrbetriq in her medical therapy.  Side effects and alternatives to Myrbetriq discussed.  She was amenable to this plan and we will see her back for annual/PRN.  MMG and DEXA UTD PAP deferred, prior hysterectomy  A total of 22 minutes were spent face-to-face with the patient as well as preparation, review, communication, and documentation during this encounter, especially in regards to her treatment of OAB and themeds we have to use and the side effects to anticipate.   Barnett Applebaum, MD, [facility], [facility] 06/18/2020  2:20 PM

## 2020-07-10 ENCOUNTER — Other Ambulatory Visit: Payer: Self-pay

## 2020-07-10 ENCOUNTER — Ambulatory Visit
Admission: RE | Admit: 2020-07-10 | Discharge: 2020-07-10 | Disposition: A | Discharge status: Home / Self Care | Payer: Medicare Other | Source: Ambulatory Visit | Attending: Nurse Practitioner | Admitting: Nurse Practitioner

## 2020-07-10 DIAGNOSIS — Z122 Encounter for screening for malignant neoplasm of respiratory organs: Secondary | ICD-10-CM | POA: Insufficient documentation

## 2020-07-10 DIAGNOSIS — Z87891 Personal history of nicotine dependence: Secondary | ICD-10-CM | POA: Diagnosis not present

## 2020-07-21 ENCOUNTER — Encounter: Payer: Self-pay | Admitting: *Deleted

## 2020-07-21 ENCOUNTER — Telehealth: Payer: Self-pay

## 2020-07-21 NOTE — Telephone Encounter (Signed)
Copied from [place] [id number]. Topic: General - Other >> Jul 21, 2020  3:08 PM Antonieta Iba C wrote: Reason for CRM: pt called in for assistance. Pt says that she would like to have a eye referral placed for her. Pt says that she was seen a [facility] but she doesn't care to go there because they completed an inaccurate test for her before. Pt would like further assistance.

## 2020-07-21 NOTE — Telephone Encounter (Signed)
Patient informed. Said she may go back to [facility] in [place].

## 2020-07-21 NOTE — Telephone Encounter (Signed)
She can go wherever she wants.  She does not need a referral.

## 2020-07-28 ENCOUNTER — Ambulatory Visit: Payer: Self-pay | Admitting: Internal Medicine

## 2020-07-28 NOTE — Progress Notes (Deleted)
Date:  07/28/2020   Name:  Carrie Myers   DOB:  Jul 24, 1945   MRN:  [id number]   Chief Complaint: No chief complaint on file.  Hypertension This is a chronic problem. The problem is controlled. Pertinent negatives include no chest pain, headaches, palpitations or shortness of breath.  Diabetes She presents for her initial diabetic visit. She has type 2 diabetes mellitus. The initial diagnosis of diabetes was made 6 months ago. Pertinent negatives for hypoglycemia include no dizziness, headaches or tremors. Pertinent negatives for diabetes include no chest pain, no fatigue, no polydipsia, no polyuria and no weakness. Current diabetic treatment includes diet. An ACE inhibitor/angiotensin II receptor blocker is being taken.  Hyperlipidemia This is a chronic problem. The problem is controlled. Pertinent negatives include no chest pain or shortness of breath. Current antihyperlipidemic treatment includes statins (pravachol). The current treatment provides significant improvement of lipids.    Lab Results  Component Value Date   CREATININE 0.99 03/28/2020   BUN 14 03/28/2020   NA 139 03/28/2020   K 4.7 03/28/2020   CL 100 03/28/2020   CO2 25 03/28/2020   Lab Results  Component Value Date   CHOL 147 03/28/2020   HDL 50 03/28/2020   LDLCALC 70 03/28/2020   TRIG 156 (H) 03/28/2020   CHOLHDL 2.9 03/28/2020   Lab Results  Component Value Date   TSH 2.160 03/28/2020   Lab Results  Component Value Date   HGBA1C 6.5 (H) 03/28/2020   Lab Results  Component Value Date   WBC 6.5 03/28/2020   HGB 13.2 03/28/2020   HCT 38.2 03/28/2020   MCV 88 03/28/2020   PLT 290 03/28/2020   Lab Results  Component Value Date   ALT 36 (H) 03/28/2020   AST 35 03/28/2020   ALKPHOS 80 03/28/2020   BILITOT 0.3 03/28/2020     Review of Systems  Constitutional: Negative for appetite change, fatigue, fever and unexpected weight change.  HENT: Negative for nosebleeds, tinnitus and trouble  swallowing.   Eyes: Negative for visual disturbance.  Respiratory: Negative for cough, chest tightness, shortness of breath and wheezing.   Cardiovascular: Negative for chest pain, palpitations and leg swelling.  Gastrointestinal: Negative for abdominal pain, constipation and diarrhea.  Endocrine: Negative for polydipsia and polyuria.  Genitourinary: Negative for dysuria and hematuria.  Musculoskeletal: Negative for arthralgias.  Neurological: Negative for dizziness, tremors, weakness, light-headedness, numbness and headaches.  Psychiatric/Behavioral: Negative for dysphoric mood.    Patient Active Problem List   Diagnosis Date Noted  . Overactive bladder 06/18/2020  . Prediabetes 04/14/2020  . Slow transit constipation 06/05/2019  . Sciatica, left side 01/01/2019  . Diverticulosis of colon 04/25/2018  . Change in bowel habits   . Tobacco use disorder, moderate, in sustained remission 12/22/2017  . Moderate episode of recurrent major depressive disorder ([place]) 10/03/2017  . Inflammatory spondylopathy of lumbar region ([place]) 10/03/2017  . Hoarseness, persistent 04/18/2017  . OSA on CPAP 02/18/2017  . Cervical disc disease 12/31/2016  . Generalized osteoarthritis 12/23/2016  . Chronic pain syndrome 12/23/2016  . Cystocele, midline 12/06/2016  . Urinary incontinence 12/06/2016  . Impingement syndrome of right shoulder 04/17/2015  . Fibrocystic breast 01/18/2015  . Gastroesophageal reflux disease without esophagitis 01/18/2015  . Hypothyroidism, postablative 01/18/2015  . Arteriosclerosis of coronary artery 12/29/2014  . DJD of shoulder 11/13/2014  . Fibromyalgia 10/02/2014  . Intercostal neuralgia 10/02/2014  . Facet syndrome, lumbar 10/02/2014  . Sacroiliac joint disease 10/02/2014  . Carotid artery narrowing  07/09/2014  . Benign essential HTN 07/01/2014  . Osteoarthritis of thumb 09/24/2012  . Mixed hyperlipidemia 09/22/2012  . Arthritis of hand, degenerative 09/22/2012     Allergies  Allergen Reactions  . Augmentin [Amoxicillin-Pot Clavulanate] Nausea And Vomiting  . Codeine Nausea And Vomiting  . Sulfa Antibiotics Nausea And Vomiting  . Proparacaine Itching  . Shellfish Allergy Nausea And Vomiting    No issues with iodine     Past Surgical History:  Procedure Laterality Date  . ABDOMINAL HYSTERECTOMY     total  . APPENDECTOMY    . BREAST BIOPSY Right    neg  . BREAST CYST ASPIRATION Left    neg  . BUNIONECTOMY Bilateral   . CESAREAN SECTION    . COLONOSCOPY  2010   normal  . COLONOSCOPY WITH PROPOFOL N/A 04/03/2018   Procedure: COLONOSCOPY WITH BIOPSIES;  Surgeon: Lucilla Lame, MD;  Location: [facility];  Service: Endoscopy;  Laterality: N/A;  sleep apnea  . CORONARY ANGIOPLASTY WITH STENT PLACEMENT  04/2006  . dental implant     seven  . epidural steroid injection  2018  . EYE SURGERY     bilateral cataract extraction  . SHOULDER ARTHROSCOPY Right 05/07/2015   Procedure: Extensive arthroscopic debridement and arthroscopic subacromial decompression, right shoulder.  2. Steroid injection right thumb CMC joint.;  Surgeon: Corky Mull, MD;  Location: [facility];  Service: Orthopedics;  Laterality: Right;  . TOOTH EXTRACTION     Upper centrals and lateral    Social History   Tobacco Use  . Smoking status: Former Smoker    Packs/day: 1.25    Years: 44.00    Pack years: 55.00    Types: Cigarettes    Quit date: 04/22/2006    Years since quitting: 14.2  . Smokeless tobacco: Never Used  . Tobacco comment:    Vaping Use  . Vaping Use: Never used  Substance Use Topics  . Alcohol use: No    Alcohol/week: 0.0 standard drinks  . Drug use: No     Medication list has been reviewed and updated.  No outpatient medications have been marked as taking for the 07/28/20 encounter (Appointment) with [name], MD.    [facility] 2/9 Scores 05/12/2020 03/28/2020 01/02/2020 12/07/2019  PHQ - 2 Score 0 0 0 0   PHQ- 9 Score - 0 - 0  Exception Documentation - - - -    GAD 7 : Generalized Anxiety Score 03/28/2020 12/07/2019 06/05/2019  Nervous, Anxious, on Edge 0 0 0  Control/stop worrying 0 0 0  Worry too much - different things 0 0 0  Trouble relaxing 0 0 0  Restless 0 0 0  Easily annoyed or irritable 0 0 0  Afraid - awful might happen 0 0 0  Total GAD 7 Score 0 0 0  Anxiety Difficulty - Not difficult at all Not difficult at all    BP Readings from Last 3 Encounters:  06/18/20 100/60  03/28/20 138/82  02/11/20 (!) 142/89    Physical Exam Vitals and nursing note reviewed.  Constitutional:      General: She is not in acute distress.    Appearance: She is well-developed.  HENT:     Head: Normocephalic and atraumatic.  Cardiovascular:     Rate and Rhythm: Normal rate and regular rhythm.  Pulmonary:     Effort: Pulmonary effort is normal. No respiratory distress.     Breath sounds: No wheezing or rhonchi.  Musculoskeletal:  Cervical back: Normal range of motion.     Right lower leg: No edema.     Left lower leg: No edema.  Lymphadenopathy:     Cervical: No cervical adenopathy.  Skin:    General: Skin is warm and dry.     Findings: No rash.  Neurological:     Mental Status: She is alert and oriented to person, place, and time.  Psychiatric:        Mood and Affect: Mood normal.        Behavior: Behavior normal.     Wt Readings from Last 3 Encounters:  07/10/20 180 lb (81.6 kg)  06/18/20 180 lb (81.6 kg)  03/28/20 191 lb (86.6 kg)    There were no vitals taken for this visit.  Assessment and Plan:

## 2020-08-04 DIAGNOSIS — H524 Presbyopia: Secondary | ICD-10-CM | POA: Diagnosis not present

## 2020-08-06 ENCOUNTER — Other Ambulatory Visit: Payer: Self-pay | Admitting: Internal Medicine

## 2020-08-06 DIAGNOSIS — M797 Fibromyalgia: Secondary | ICD-10-CM

## 2020-08-20 DIAGNOSIS — K219 Gastro-esophageal reflux disease without esophagitis: Secondary | ICD-10-CM | POA: Diagnosis not present

## 2020-08-27 ENCOUNTER — Other Ambulatory Visit: Payer: Self-pay

## 2020-08-27 ENCOUNTER — Encounter: Payer: Self-pay | Admitting: Anesthesiology

## 2020-08-27 ENCOUNTER — Ambulatory Visit: Payer: Medicare Other | Attending: Anesthesiology | Admitting: Anesthesiology

## 2020-08-27 DIAGNOSIS — M542 Cervicalgia: Secondary | ICD-10-CM

## 2020-08-27 DIAGNOSIS — M797 Fibromyalgia: Secondary | ICD-10-CM | POA: Diagnosis not present

## 2020-08-27 DIAGNOSIS — M545 Low back pain, unspecified: Secondary | ICD-10-CM

## 2020-08-27 DIAGNOSIS — G8929 Other chronic pain: Secondary | ICD-10-CM | POA: Diagnosis not present

## 2020-08-27 DIAGNOSIS — M47812 Spondylosis without myelopathy or radiculopathy, cervical region: Secondary | ICD-10-CM | POA: Diagnosis not present

## 2020-08-27 DIAGNOSIS — M47816 Spondylosis without myelopathy or radiculopathy, lumbar region: Secondary | ICD-10-CM

## 2020-08-27 DIAGNOSIS — G894 Chronic pain syndrome: Secondary | ICD-10-CM

## 2020-08-27 DIAGNOSIS — M5442 Lumbago with sciatica, left side: Secondary | ICD-10-CM

## 2020-08-27 DIAGNOSIS — M5136 Other intervertebral disc degeneration, lumbar region: Secondary | ICD-10-CM | POA: Diagnosis not present

## 2020-08-27 DIAGNOSIS — F1190 Opioid use, unspecified, uncomplicated: Secondary | ICD-10-CM

## 2020-08-27 DIAGNOSIS — M159 Polyosteoarthritis, unspecified: Secondary | ICD-10-CM

## 2020-08-27 DIAGNOSIS — M5441 Lumbago with sciatica, right side: Secondary | ICD-10-CM

## 2020-08-27 MED ORDER — DIAZEPAM 5 MG PO TABS: 5.0000 mg | ORAL_TABLET | Freq: Every evening | ORAL | 0 refills | Status: AC | PRN

## 2020-08-27 MED ORDER — HYDROCODONE-ACETAMINOPHEN 5-325 MG PO TABS: 1.0000 | ORAL_TABLET | Freq: Two times a day (BID) | ORAL | 0 refills | Status: DC | PRN

## 2020-08-27 MED ORDER — DIAZEPAM 5 MG PO TABS: 5.0000 mg | ORAL_TABLET | Freq: Every evening | ORAL | 0 refills | Status: DC | PRN

## 2020-09-01 DIAGNOSIS — R053 Chronic cough: Secondary | ICD-10-CM | POA: Diagnosis not present

## 2020-09-01 DIAGNOSIS — K219 Gastro-esophageal reflux disease without esophagitis: Secondary | ICD-10-CM | POA: Diagnosis not present

## 2020-09-01 NOTE — Progress Notes (Addendum)
Virtual Visit via Telephone Note  I connected with Carrie Myers on 09/01/20 at 10:20 AM EDT by telephone and verified that I am speaking with the correct person using two identifiers.  Location: Patient: Home Provider: Pain control management   I discussed the limitations, risks, security and privacy concerns of performing an evaluation and management service by telephone and the availability of in person appointments. I also discussed with the patient that there may be a patient responsible charge related to this service. The patient expressed understanding and agreed to proceed.   History of Present Illness: I spoke with Carrie Myers today via telephone we were unable to link for the video portion of this conference.  She reports that she is doing reasonably well with her low back pain.  She still has problems when she is very active and up for an extended period of time.  The quality characteristic and distribution of the pain is otherwise been stable.  She is taking her medications as prescribed and these continue to work well for her.  She averages a Vicodin approximately 1 or 2 tablets/day and this keeps her pain under good control and allows her to stay active.  No change in lower extremity strength or function or bowel bladder function is noted.  She also takes a Valium at bedtime and this works well.  She knows not to utilize these medications at the same time.  She is try to do her physical therapy exercises as best she can and stay active.  No side effects of the medications are reported she continues to derive good functional lifestyle benefits.  Review of systems: Constitutional: No fevers chills nausea GI: No constipation or diarrhea Pulmonary: No dyspnea or shortness of breath or fatigue   Observations/Objective:  Current Outpatient Medications:    diazepam (VALIUM) 5 MG tablet, Take 1 tablet (5 mg total) by mouth at bedtime as needed for anxiety., Disp: 30 tablet, Rfl: 0    [START ON 09/26/2020] diazepam (VALIUM) 5 MG tablet, Take 1 tablet (5 mg total) by mouth at bedtime as needed for anxiety., Disp: 30 tablet, Rfl: 0   aspirin EC 81 MG tablet, Take 81 mg by mouth daily., Disp: , Rfl:    citalopram (CELEXA) 20 MG tablet, TAKE 1 TABLET BY MOUTH  DAILY, Disp: 90 tablet, Rfl: 3   COMBIVENT RESPIMAT 20-100 MCG/ACT AERS respimat, SMARTSIG:2 Inhalation Via Inhaler 4 Times Daily PRN, Disp: , Rfl:    Diclofenac-miSOPROStol 75-0.2 MG TBEC, Take 1 tablet by mouth 2 (two) times daily., Disp: 180 tablet, Rfl: 1   DULoxetine (CYMBALTA) 60 MG capsule, TAKE 1 CAPSULE(60 MG) BY MOUTH DAILY, Disp: 30 capsule, Rfl: 0   esomeprazole (NEXIUM) 20 MG capsule, Take 40 mg by mouth daily. Over the Counter, Disp: , Rfl:    HYDROcodone-acetaminophen (NORCO/VICODIN) 5-325 MG tablet, Take 1 tablet by mouth 2 (two) times daily as needed for moderate pain. Limit 1-2 tablets by mouth per day if tolerated, Disp: 45 tablet, Rfl: 0   [START ON 09/26/2020] HYDROcodone-acetaminophen (NORCO/VICODIN) 5-325 MG tablet, Take 1 tablet by mouth 2 (two) times daily as needed for moderate pain., Disp: 45 tablet, Rfl: 0   levothyroxine (SYNTHROID) 88 MCG tablet, TAKE 1 TABLET BY MOUTH  DAILY, Disp: 90 tablet, Rfl: 3   Loperamide HCl (IMODIUM PO), Take by mouth as needed., Disp: , Rfl:    losartan (COZAAR) 50 MG tablet, Take 50 mg by mouth daily., Disp: , Rfl:    mirabegron ER (MYRBETRIQ) 50  MG TB24 tablet, Take 1 tablet (50 mg total) by mouth daily., Disp: 30 tablet, Rfl: 11   Multiple Vitamin (MULTI-VITAMINS) TABS, Take by mouth., Disp: , Rfl:    pravastatin (PRAVACHOL) 80 MG tablet, Take 80 mg by mouth daily., Disp: , Rfl:    tolterodine (DETROL LA) 4 MG 24 hr capsule, TAKE 1 CAPSULE(4 MG) BY MOUTH DAILY, Disp: 90 capsule, Rfl: 3   Assessment and Plan: 1. Chronic bilateral low back pain with bilateral sciatica   2. DDD (degenerative disc disease), lumbar   3. Chronic pain syndrome   4. Fibromyalgia   5.  Chronic, continuous use of opioids   6. Chronic bilateral low back pain without sciatica   7. Facet syndrome, lumbar   8. Cervical facet joint syndrome   9. Generalized osteoarthritis   10. Cervicalgia   Based on her discussion today no further changes will be made in her pain management protocol.  I want her to continue with her current medication regimen with opioids scheduled for June 24 and July 24.  I have reviewed the [facility] practitioner database information and is appropriate for refill.  Furthermore she can continue with the Valium as this is helping with sleep at night and muscle spasm improvement and is well-tolerated.  I want her to continue with physical therapy as well with continued follow-up in approximately 2 months.  No other changes are initiated today with continued follow-up with her primary care physician for baseline medical care.  Follow Up Instructions:    I discussed the assessment and treatment plan with the patient. The patient was provided an opportunity to ask questions and all were answered. The patient agreed with the plan and demonstrated an understanding of the instructions.   The patient was advised to call back or seek an in-person evaluation if the symptoms worsen or if the condition fails to improve as anticipated.  I provided 30 minutes of non-face-to-face time during this encounter.   Molli Barrows, MD

## 2020-09-05 ENCOUNTER — Encounter: Payer: Self-pay | Admitting: Internal Medicine

## 2020-09-05 ENCOUNTER — Other Ambulatory Visit: Payer: Self-pay

## 2020-09-05 ENCOUNTER — Ambulatory Visit (INDEPENDENT_AMBULATORY_CARE_PROVIDER_SITE_OTHER): Payer: Medicare Other | Admitting: Internal Medicine

## 2020-09-05 VITALS — BP 140/82 | HR 73 | Ht 68.0 in | Wt 179.0 lb

## 2020-09-05 DIAGNOSIS — R7303 Prediabetes: Secondary | ICD-10-CM | POA: Diagnosis not present

## 2020-09-05 DIAGNOSIS — I10 Essential (primary) hypertension: Secondary | ICD-10-CM | POA: Diagnosis not present

## 2020-09-05 DIAGNOSIS — I2510 Atherosclerotic heart disease of native coronary artery without angina pectoris: Secondary | ICD-10-CM | POA: Diagnosis not present

## 2020-09-05 NOTE — Progress Notes (Signed)
Date:  09/05/2020   Name:  Carrie Myers   DOB:  [birth year]   MRN:  [id number]   Chief Complaint: Weight Check and Diabetes  Hypertension This is a chronic problem. The problem is controlled. Pertinent negatives include no chest pain, headaches, palpitations or shortness of breath. Risk factors: has CAD with hx of stents. Past treatments include angiotensin blockers.  Diabetes She presents for her follow-up diabetic visit. Diabetes type: prediabetes. The initial diagnosis of diabetes was made 6 months ago. Pertinent negatives for hypoglycemia include no dizziness, headaches or nervousness/anxiousness. Pertinent negatives for diabetes include no chest pain and no fatigue. Current diabetic treatment includes diet. An ACE inhibitor/angiotensin II receptor blocker is being taken.   Lab Results  Component Value Date   CREATININE 0.99 03/28/2020   BUN 14 03/28/2020   NA 139 03/28/2020   K 4.7 03/28/2020   CL 100 03/28/2020   CO2 25 03/28/2020   Lab Results  Component Value Date   CHOL 147 03/28/2020   HDL 50 03/28/2020   LDLCALC 70 03/28/2020   TRIG 156 (H) 03/28/2020   CHOLHDL 2.9 03/28/2020   Lab Results  Component Value Date   TSH 2.160 03/28/2020   Lab Results  Component Value Date   HGBA1C 6.5 (H) 03/28/2020   Lab Results  Component Value Date   WBC 6.5 03/28/2020   HGB 13.2 03/28/2020   HCT 38.2 03/28/2020   MCV 88 03/28/2020   PLT 290 03/28/2020   Lab Results  Component Value Date   ALT 36 (H) 03/28/2020   AST 35 03/28/2020   ALKPHOS 80 03/28/2020   BILITOT 0.3 03/28/2020     Review of Systems  Constitutional:  Positive for unexpected weight change (has lost 10 lbs since last visit). Negative for chills, fatigue and fever.  Respiratory:  Negative for chest tightness and shortness of breath.   Cardiovascular:  Negative for chest pain, palpitations and leg swelling.  Gastrointestinal:  Negative for constipation and diarrhea.  Neurological:  Negative  for dizziness and headaches.  Psychiatric/Behavioral:  Negative for dysphoric mood. The patient is not nervous/anxious.    Patient Active Problem List   Diagnosis Date Noted   Overactive bladder 06/18/2020   Prediabetes 04/14/2020   Slow transit constipation 06/05/2019   Sciatica, left side 01/01/2019   Diverticulosis of colon 04/25/2018   Change in bowel habits    Tobacco use disorder, moderate, in sustained remission 12/22/2017   Moderate episode of recurrent major depressive disorder ([place]) 10/03/2017   Inflammatory spondylopathy of lumbar region ([place]) 10/03/2017   Hoarseness, persistent 04/18/2017   OSA on CPAP 02/18/2017   Cervical disc disease 12/31/2016   Generalized osteoarthritis 12/23/2016   Chronic pain syndrome 12/23/2016   Cystocele, midline 12/06/2016   Urinary incontinence 12/06/2016   Impingement syndrome of right shoulder 04/17/2015   Fibrocystic breast 01/18/2015   Gastroesophageal reflux disease without esophagitis 01/18/2015   Hypothyroidism, postablative 01/18/2015   Arteriosclerosis of coronary artery 12/29/2014   DJD of shoulder 11/13/2014   Fibromyalgia 10/02/2014   Intercostal neuralgia 10/02/2014   Facet syndrome, lumbar 10/02/2014   Sacroiliac joint disease 10/02/2014   Carotid artery narrowing 07/09/2014   Benign essential HTN 07/01/2014   Osteoarthritis of thumb 09/24/2012   Mixed hyperlipidemia 09/22/2012   Arthritis of hand, degenerative 09/22/2012    Allergies  Allergen Reactions   Augmentin [Amoxicillin-Pot Clavulanate] Nausea And Vomiting   Codeine Nausea And Vomiting   Sulfa Antibiotics Nausea And Vomiting   Proparacaine Itching  Shellfish Allergy Nausea And Vomiting    No issues with iodine     Past Surgical History:  Procedure Laterality Date   ABDOMINAL HYSTERECTOMY     total   APPENDECTOMY     BREAST BIOPSY Right    neg   BREAST CYST ASPIRATION Left    neg   BUNIONECTOMY Bilateral    CESAREAN SECTION     COLONOSCOPY   2010   normal   COLONOSCOPY WITH PROPOFOL N/A 04/03/2018   Procedure: COLONOSCOPY WITH BIOPSIES;  Surgeon: Lucilla Lame, MD;  Location: [place];  Service: Endoscopy;  Laterality: N/A;  sleep apnea   CORONARY ANGIOPLASTY WITH STENT PLACEMENT  04/2006   dental implant     seven   epidural steroid injection  2018   EYE SURGERY     bilateral cataract extraction   SHOULDER ARTHROSCOPY Right 05/07/2015   Procedure: Extensive arthroscopic debridement and arthroscopic subacromial decompression, right shoulder.                        2.  Steroid injection right thumb CMC joint.;  Surgeon: Corky Mull, MD;  Location: [place];  Service: Orthopedics;  Laterality: Right;   TOOTH EXTRACTION     Upper centrals and lateral    Social History   Tobacco Use   Smoking status: Former    Packs/day: 1.25    Years: 44.00    Pack years: 55.00    Types: Cigarettes    Quit date: 04/22/2006    Years since quitting: 14.3   Smokeless tobacco: Never   Tobacco comments:       Vaping Use   Vaping Use: Never used  Substance Use Topics   Alcohol use: No    Alcohol/week: 0.0 standard drinks   Drug use: No     Medication list has been reviewed and updated.  Current Meds  Medication Sig   Armodafinil 150 MG tablet Take 150 mg by mouth daily.   aspirin EC 81 MG tablet Take 81 mg by mouth daily.   citalopram (CELEXA) 20 MG tablet TAKE 1 TABLET BY MOUTH  DAILY   COMBIVENT RESPIMAT 20-100 MCG/ACT AERS respimat SMARTSIG:2 Inhalation Via Inhaler 4 Times Daily PRN   diazepam (VALIUM) 5 MG tablet Take 1 tablet (5 mg total) by mouth at bedtime as needed for anxiety.   Diclofenac-miSOPROStol 75-0.2 MG TBEC Take 1 tablet by mouth 2 (two) times daily.   DULoxetine (CYMBALTA) 60 MG capsule TAKE 1 CAPSULE(60 MG) BY MOUTH DAILY   esomeprazole (NEXIUM) 20 MG capsule Take 40 mg by mouth daily. Over the Counter   [START ON 09/26/2020] HYDROcodone-acetaminophen (NORCO/VICODIN) 5-325 MG tablet Take 1  tablet by mouth 2 (two) times daily as needed for moderate pain.   ipratropium (ATROVENT) 0.06 % nasal spray Place into both nostrils.   levothyroxine (SYNTHROID) 88 MCG tablet TAKE 1 TABLET BY MOUTH  DAILY   Loperamide HCl (IMODIUM PO) Take by mouth as needed.   losartan (COZAAR) 50 MG tablet Take 50 mg by mouth daily.   mirabegron ER (MYRBETRIQ) 50 MG TB24 tablet Take 1 tablet (50 mg total) by mouth daily.   Multiple Vitamin (MULTI-VITAMINS) TABS Take by mouth.   pantoprazole (PROTONIX) 40 MG tablet Take 1 tablet by mouth 2 (two) times daily.   pravastatin (PRAVACHOL) 80 MG tablet Take 80 mg by mouth daily.   tolterodine (DETROL LA) 4 MG 24 hr capsule TAKE 1 CAPSULE(4 MG) BY MOUTH DAILY   [  DISCONTINUED] diazepam (VALIUM) 5 MG tablet Take 1 tablet (5 mg total) by mouth at bedtime as needed for anxiety.    PHQ 2/9 Scores 09/05/2020 05/12/2020 03/28/2020 01/02/2020  PHQ - 2 Score 0 0 0 0  PHQ- 9 Score 0 - 0 -  Exception Documentation - - - -    GAD 7 : Generalized Anxiety Score 09/05/2020 03/28/2020 12/07/2019 06/05/2019  Nervous, Anxious, on Edge 0 0 0 0  Control/stop worrying 0 0 0 0  Worry too much - different things 0 0 0 0  Trouble relaxing 0 0 0 0  Restless 0 0 0 0  Easily annoyed or irritable 0 0 0 0  Afraid - awful might happen 0 0 0 0  Total GAD 7 Score 0 0 0 0  Anxiety Difficulty - - Not difficult at all Not difficult at all    BP Readings from Last 3 Encounters:  09/05/20 140/82  06/18/20 100/60  03/28/20 138/82    Physical Exam Vitals and nursing note reviewed.  Constitutional:      General: She is not in acute distress.    Appearance: She is well-developed.  HENT:     Head: Normocephalic and atraumatic.  Neck:     Vascular: No carotid bruit.  Cardiovascular:     Rate and Rhythm: Normal rate and regular rhythm.     Pulses: Normal pulses.  Pulmonary:     Effort: Pulmonary effort is normal. No respiratory distress.     Breath sounds: No wheezing or rhonchi.   Musculoskeletal:     Cervical back: Normal range of motion.     Right lower leg: No edema.     Left lower leg: No edema.  Lymphadenopathy:     Cervical: No cervical adenopathy.  Skin:    General: Skin is warm and dry.     Findings: No rash.  Neurological:     Mental Status: She is alert and oriented to person, place, and time.  Psychiatric:        Mood and Affect: Mood normal.        Behavior: Behavior normal.    Wt Readings from Last 3 Encounters:  09/05/20 179 lb (81.2 kg)  07/10/20 180 lb (81.6 kg)  06/18/20 180 lb (81.6 kg)    BP 140/82 (BP Location: Right Arm, Patient Position: Sitting, Cuff Size: Normal)   Pulse 73   Ht 5\' 8"  (1.727 m)   Wt 179 lb (81.2 kg)   SpO2 96%   BMI 27.22 kg/m   Assessment and Plan: 1. Benign essential HTN Clinically stable exam with well controlled BP. Tolerating medications without side effects at this time. Pt to continue current regimen and low sodium diet; benefits of regular exercise as able discussed.  2. Arteriosclerosis of coronary artery On statin therapy Followed by Cardiology  3. Prediabetes Doing great with diet and weight loss  Lab Orders  Hemoglobin X6P  Basic metabolic panel    Partially dictated using Editor, commissioning. Any errors are unintentional.  Halina Maidens, MD [facility]  09/05/2020

## 2020-09-06 LAB — BASIC METABOLIC PANEL
BUN/Creatinine Ratio: 29 — ABNORMAL HIGH (ref 12–28)
BUN: 27 mg/dL (ref 8–27)
CO2: 24 mmol/L (ref 20–29)
Calcium: 9.6 mg/dL (ref 8.7–10.3)
Chloride: 98 mmol/L (ref 96–106)
Creatinine, Ser: 0.94 mg/dL (ref 0.57–1.00)
Glucose: 85 mg/dL (ref 65–99)
Potassium: 4.8 mmol/L (ref 3.5–5.2)
Sodium: 138 mmol/L (ref 134–144)
eGFR: 64 mL/min/{1.73_m2} (ref >59)

## 2020-09-06 LAB — HEMOGLOBIN A1C
Est. average glucose Bld gHb Est-mCnc: 128 mg/dL
Hgb A1c MFr Bld: 6.1 % — ABNORMAL HIGH (ref 4.8–5.6)

## 2020-09-08 DIAGNOSIS — H0288B Meibomian gland dysfunction left eye, upper and lower eyelids: Secondary | ICD-10-CM | POA: Diagnosis not present

## 2020-09-08 DIAGNOSIS — H04123 Dry eye syndrome of bilateral lacrimal glands: Secondary | ICD-10-CM | POA: Diagnosis not present

## 2020-09-08 DIAGNOSIS — Z961 Presence of intraocular lens: Secondary | ICD-10-CM | POA: Diagnosis not present

## 2020-09-08 DIAGNOSIS — H26492 Other secondary cataract, left eye: Secondary | ICD-10-CM | POA: Diagnosis not present

## 2020-09-08 DIAGNOSIS — H0288A Meibomian gland dysfunction right eye, upper and lower eyelids: Secondary | ICD-10-CM | POA: Diagnosis not present

## 2020-09-24 ENCOUNTER — Other Ambulatory Visit: Payer: Self-pay | Admitting: Internal Medicine

## 2020-09-24 DIAGNOSIS — F331 Major depressive disorder, recurrent, moderate: Secondary | ICD-10-CM

## 2020-09-27 ENCOUNTER — Encounter: Payer: Self-pay | Admitting: Emergency Medicine

## 2020-09-27 ENCOUNTER — Ambulatory Visit
Admission: EM | Admit: 2020-09-27 | Discharge: 2020-09-27 | Disposition: A | Discharge status: Home / Self Care | Payer: Medicare Other | Attending: Family Medicine | Admitting: Family Medicine

## 2020-09-27 ENCOUNTER — Ambulatory Visit
Admission: RE | Admit: 2020-09-27 | Discharge: 2020-09-27 | Disposition: A | Discharge status: Home / Self Care | Payer: Medicare Other | Source: Ambulatory Visit | Attending: Family Medicine | Admitting: Family Medicine

## 2020-09-27 ENCOUNTER — Other Ambulatory Visit: Payer: Self-pay

## 2020-09-27 DIAGNOSIS — T50905A Adverse effect of unspecified drugs, medicaments and biological substances, initial encounter: Secondary | ICD-10-CM | POA: Diagnosis not present

## 2020-09-27 DIAGNOSIS — M7989 Other specified soft tissue disorders: Secondary | ICD-10-CM | POA: Diagnosis not present

## 2020-09-27 DIAGNOSIS — M79661 Pain in right lower leg: Secondary | ICD-10-CM | POA: Insufficient documentation

## 2020-09-27 NOTE — Discharge Instructions (Addendum)
I will call with the results.  Stop the medication.  Take care  Dr. Lacinda Axon

## 2020-09-27 NOTE — ED Triage Notes (Signed)
Patient c/o bilateral lower leg pain and swelling in her right calf that started last night.  Patient states that she started Arformoterol about 5 days ago.  Patient states that she read that this could be side effect of this medicine.

## 2020-09-28 NOTE — ED Provider Notes (Signed)
[facility]    CSN: [id number] Arrival date & time: 09/27/20  1429      History   Chief Complaint Chief Complaint  Patient presents with   Medication Reaction    HPI 75 year old female presents for evaluation of the above.  Patient reports that she recently started a new medication, arformoterol.  Patient states that she is concerned that she is developing adverse effects from medication.  She reports pain in her lower extremities.  She reports swelling around the ankles particularly of the right ankle.  She reports right calf pain.  Patient also notes that when she uses medication she coughs quite a bit and has nasal congestion afterwards.  She states that she does not feel that the medication helps her very much and actually makes her feel more short of breath.  No reports of redness of her lower extremities.  She rates her pain as 8/10 in severity.  No other complaints at this time.  Past Medical History:  Diagnosis Date   Allergy    Anxiety    Arthritis    neck, hands, lower back   Bilateral dry eyes    Chronic low back pain 12/23/2016   DDD (degenerative disc disease), lumbar 10/02/2014   Dental crowns present    Implants, front "flipper" while waiting for other implants   Emphysema of lung ([place])    Fibromyalgia    Fibromyalgia    Fracture of right foot 01/14/2015   GERD (gastroesophageal reflux disease)    Hyperlipidemia    Hypertension    Kidney stones    Myocardial infarction ([place]) 04/22/2006   Osteoarthritis    Other shoulder lesions, right shoulder 04/17/2015   Rheumatoid arthritis ([place])    Sciatica, left side 01/01/2019   Sleep apnea    CPAP   Status post shoulder surgery 05/27/2015   Thyroid disease    Vertigo    none for approx 9 yrs    Patient Active Problem List   Diagnosis Date Noted   Overactive bladder 06/18/2020   Prediabetes 04/14/2020   Slow transit constipation 06/05/2019   Sciatica, left side 01/01/2019   Diverticulosis of colon  04/25/2018   Change in bowel habits    Tobacco use disorder, moderate, in sustained remission 12/22/2017   Moderate episode of recurrent major depressive disorder ([place]) 10/03/2017   Inflammatory spondylopathy of lumbar region ([place]) 10/03/2017   Hoarseness, persistent 04/18/2017   OSA on CPAP 02/18/2017   Cervical disc disease 12/31/2016   Generalized osteoarthritis 12/23/2016   Chronic pain syndrome 12/23/2016   Cystocele, midline 12/06/2016   Urinary incontinence 12/06/2016   Impingement syndrome of right shoulder 04/17/2015   Fibrocystic breast 01/18/2015   Gastroesophageal reflux disease without esophagitis 01/18/2015   Hypothyroidism, postablative 01/18/2015   Arteriosclerosis of coronary artery 12/29/2014   DJD of shoulder 11/13/2014   Fibromyalgia 10/02/2014   Intercostal neuralgia 10/02/2014   Facet syndrome, lumbar 10/02/2014   Sacroiliac joint disease 10/02/2014   Carotid artery narrowing 07/09/2014   Benign essential HTN 07/01/2014   Osteoarthritis of thumb 09/24/2012   Mixed hyperlipidemia 09/22/2012   Arthritis of hand, degenerative 09/22/2012    Past Surgical History:  Procedure Laterality Date   ABDOMINAL HYSTERECTOMY     total   APPENDECTOMY     BREAST BIOPSY Right    neg   BREAST CYST ASPIRATION Left    neg   BUNIONECTOMY Bilateral    CESAREAN SECTION     COLONOSCOPY  2010   normal  COLONOSCOPY WITH PROPOFOL N/A 04/03/2018   Procedure: COLONOSCOPY WITH BIOPSIES;  Surgeon: Lucilla Lame, MD;  Location: [place];  Service: Endoscopy;  Laterality: N/A;  sleep apnea   CORONARY ANGIOPLASTY WITH STENT PLACEMENT  04/2006   dental implant     seven   epidural steroid injection  2018   EYE SURGERY     bilateral cataract extraction   SHOULDER ARTHROSCOPY Right 05/07/2015   Procedure: Extensive arthroscopic debridement and arthroscopic subacromial decompression, right shoulder.                        2.  Steroid injection right thumb CMC joint.;   Surgeon: Corky Mull, MD;  Location: [place];  Service: Orthopedics;  Laterality: Right;   TOOTH EXTRACTION     Upper centrals and lateral    OB History   No obstetric history on file.      Home Medications    Prior to Admission medications   Medication Sig Start Date End Date Taking? Authorizing Provider  Armodafinil 150 MG tablet Take 150 mg by mouth daily. 08/29/20  Yes [provider]  aspirin EC 81 MG tablet Take 81 mg by mouth daily.   Yes [provider]  citalopram (CELEXA) 20 MG tablet TAKE 1 TABLET BY MOUTH  DAILY 10/12/18  Yes [name], MD  COMBIVENT RESPIMAT 20-100 MCG/ACT AERS respimat SMARTSIG:2 Inhalation Via Inhaler 4 Times Daily PRN 04/25/20  Yes [provider]  Diclofenac-miSOPROStol 75-0.2 MG TBEC Take 1 tablet by mouth 2 (two) times daily. 06/11/20  Yes [name], MD  DULoxetine (CYMBALTA) 60 MG capsule TAKE 1 CAPSULE BY MOUTH  DAILY 09/24/20  Yes [name], MD  esomeprazole (NEXIUM) 20 MG capsule Take 40 mg by mouth daily. Over the Counter   Yes [provider]  HYDROcodone-acetaminophen (NORCO/VICODIN) 5-325 MG tablet Take 1 tablet by mouth 2 (two) times daily as needed for moderate pain. 09/26/20 10/26/20 Yes Molli Barrows, MD  ipratropium (ATROVENT) 0.06 % nasal spray Place into both nostrils. 08/21/20  Yes [provider]  levothyroxine (SYNTHROID) 88 MCG tablet TAKE 1 TABLET BY MOUTH  DAILY 04/30/20  Yes [name], MD  Loperamide HCl (IMODIUM PO) Take by mouth as needed.   Yes [provider]  losartan (COZAAR) 50 MG tablet Take 50 mg by mouth daily.   Yes [provider]  mirabegron ER (MYRBETRIQ) 50 MG TB24 tablet Take 1 tablet (50 mg total) by mouth daily. 06/18/20  Yes Gae Dry, MD  Multiple Vitamin (MULTI-VITAMINS) TABS Take by mouth.   Yes [provider]  pantoprazole (PROTONIX) 40 MG tablet Take 1 tablet by mouth 2 (two) times daily. 08/21/20   Yes [provider]  pravastatin (PRAVACHOL) 80 MG tablet Take 80 mg by mouth daily.   Yes [provider]  tolterodine (DETROL LA) 4 MG 24 hr capsule TAKE 1 CAPSULE(4 MG) BY MOUTH DAILY 06/20/19  Yes Gae Dry, MD    Family History Family History  Problem Relation Age of Onset   Stroke Mother    Heart disease Father    Pancreatic cancer Sister    Diabetes Sister    Prostate cancer Brother    Breast cancer Neg Hx    Kidney cancer Neg Hx    Bladder Cancer Neg Hx     Social History Social History   Tobacco Use   Smoking status: Former  Packs/day: 1.25    Years: 44.00    Pack years: 55.00    Types: Cigarettes    Quit date: 04/22/2006    Years since quitting: 14.4   Smokeless tobacco: Never   Tobacco comments:       Vaping Use   Vaping Use: Never used  Substance Use Topics   Alcohol use: No    Alcohol/week: 0.0 standard drinks   Drug use: No     Allergies   Augmentin [amoxicillin-pot clavulanate], Codeine, Sulfa antibiotics, Proparacaine, and Shellfish allergy   Review of Systems Review of Systems Per HPI  Physical Exam Triage Vital Signs ED Triage Vitals  Enc Vitals Group     BP 09/27/20 1451 139/86     Pulse Rate 09/27/20 1451 79     Resp 09/27/20 1451 14     Temp 09/27/20 1451 98.3 F (36.8 C)     Temp Source 09/27/20 1451 Oral     SpO2 09/27/20 1451 99 %     Weight 09/27/20 1449 180 lb (81.6 kg)     Height 09/27/20 1449 5\' 8"  (1.727 m)     Head Circumference --      Peak Flow --      Pain Score 09/27/20 1448 8     Pain Loc --      Pain Edu? --      Excl. in [place]? --    Updated Vital Signs BP 139/86 (BP Location: Right Arm)   Pulse 79   Temp 98.3 F (36.8 C) (Oral)   Resp 14   Ht 5\' 8"  (1.727 m)   Wt 81.6 kg   SpO2 99%   BMI 27.37 kg/m   Visual Acuity Right Eye Distance:   Left Eye Distance:   Bilateral Distance:    Right Eye Near:   Left Eye Near:    Bilateral Near:     Physical Exam Constitutional:       General: She is not in acute distress.    Appearance: Normal appearance. She is not ill-appearing.  Eyes:     General:        Right eye: No discharge.        Left eye: No discharge.     Conjunctiva/sclera: Conjunctivae normal.  Cardiovascular:     Rate and Rhythm: Normal rate and regular rhythm.  Pulmonary:     Effort: Pulmonary effort is normal.     Breath sounds: Normal breath sounds. No wheezing, rhonchi or rales.  Musculoskeletal:     Comments: Mild swelling around the right ankle.  Patient with calf tenderness.  No erythema. Negative Homans' sign.  Neurological:     Mental Status: She is alert.  Psychiatric:        Mood and Affect: Mood normal.        Behavior: Behavior normal.     UC Treatments / Results  Labs (all labs ordered are listed, but only abnormal results are displayed) Labs Reviewed - No data to display  EKG   Radiology US Venous Img Lower Unilateral Right (DVT)  Result Date: 09/27/2020 CLINICAL DATA:  Right calf pain and swelling since last night. EXAM: RIGHT LOWER EXTREMITY VENOUS DOPPLER ULTRASOUND TECHNIQUE: Gray-scale sonography with compression, as well as color and duplex ultrasound, were performed to evaluate the deep venous system(s) from the level of the common femoral vein through the popliteal and proximal calf veins. COMPARISON:  None. FINDINGS: VENOUS Normal compressibility of the common femoral, superficial femoral, and popliteal veins, as well  as the visualized calf veins. Visualized portions of profunda femoral vein and great saphenous vein unremarkable. No filling defects to suggest DVT on grayscale or color Doppler imaging. Doppler waveforms show normal direction of venous flow, normal respiratory plasticity and response to augmentation. Limited views of the contralateral common femoral vein are unremarkable. OTHER 3.5 x 0.9 x 2.5 cm complex fluid collection in the right popliteal fossa extending into the medial right calf. Limitations: None.  IMPRESSION: 1. No evidence of right lower extremity DVT. 2. 3.5 cm complex fluid collection in the right popliteal fossa extending into the medial right calf. Differential considerations include ruptured Baker cyst or hematoma. Electronically Signed   By: Titus Dubin M.D.   On: 09/27/2020 17:37    Procedures Procedures (including critical care time)  Medications Ordered in UC Medications - No data to display  Initial Impression / Assessment and Plan / UC Course  I have reviewed the triage vital signs and the nursing notes.  Pertinent labs & imaging results that were available during my care of the patient were reviewed by me and considered in my medical decision making (see chart for details).    75 year old female presents with concerns for adverse medication effects.  Patient with pain in the right calf and swelling of the right lower extremity on exam.  Ultrasound was obtained to ensure no DVT.  Ultrasound was negative for DVT.  It did reveal findings consistent with a ruptured Baker's cyst.  I believe that at least some of the patient's symptoms are related to the medication use.  Advised to stop arformoterol.  Follow-up with her pulmonologist.  Supportive care.  Final Clinical Impressions(s) / UC Diagnoses   Final diagnoses:  Adverse effect of drug, initial encounter  Right calf pain     Discharge Instructions      I will call with the results.  Stop the medication.  Take care  Dr. Lacinda Axon    ED Prescriptions   None    PDMP not reviewed this encounter.   Thersa Salt [place], [place] 09/28/20 [phone]

## 2020-10-02 ENCOUNTER — Encounter: Payer: Self-pay | Admitting: *Deleted

## 2020-10-02 DIAGNOSIS — M7989 Other specified soft tissue disorders: Secondary | ICD-10-CM | POA: Diagnosis not present

## 2020-10-02 DIAGNOSIS — M79661 Pain in right lower leg: Secondary | ICD-10-CM | POA: Diagnosis not present

## 2020-10-03 ENCOUNTER — Ambulatory Visit
Admission: RE | Admit: 2020-10-03 | Discharge: 2020-10-03 | Disposition: A | Discharge status: Home / Self Care | Payer: Medicare Other | Attending: Gastroenterology | Admitting: Gastroenterology

## 2020-10-03 ENCOUNTER — Ambulatory Visit: Payer: Medicare Other | Admitting: Anesthesiology

## 2020-10-03 ENCOUNTER — Encounter
Admission: RE | Disposition: A | Discharge status: Home / Self Care | Payer: Self-pay | Source: Home / Self Care | Attending: Gastroenterology

## 2020-10-03 ENCOUNTER — Other Ambulatory Visit: Payer: Self-pay

## 2020-10-03 ENCOUNTER — Encounter: Payer: Self-pay | Admitting: *Deleted

## 2020-10-03 DIAGNOSIS — Z87891 Personal history of nicotine dependence: Secondary | ICD-10-CM | POA: Diagnosis not present

## 2020-10-03 DIAGNOSIS — Z882 Allergy status to sulfonamides status: Secondary | ICD-10-CM | POA: Diagnosis not present

## 2020-10-03 DIAGNOSIS — Z79899 Other long term (current) drug therapy: Secondary | ICD-10-CM | POA: Insufficient documentation

## 2020-10-03 DIAGNOSIS — Z886 Allergy status to analgesic agent status: Secondary | ICD-10-CM | POA: Insufficient documentation

## 2020-10-03 DIAGNOSIS — Z91013 Allergy to seafood: Secondary | ICD-10-CM | POA: Diagnosis not present

## 2020-10-03 DIAGNOSIS — K219 Gastro-esophageal reflux disease without esophagitis: Secondary | ICD-10-CM | POA: Insufficient documentation

## 2020-10-03 DIAGNOSIS — K209 Esophagitis, unspecified without bleeding: Secondary | ICD-10-CM | POA: Insufficient documentation

## 2020-10-03 DIAGNOSIS — Z885 Allergy status to narcotic agent status: Secondary | ICD-10-CM | POA: Diagnosis not present

## 2020-10-03 DIAGNOSIS — K2970 Gastritis, unspecified, without bleeding: Secondary | ICD-10-CM | POA: Diagnosis not present

## 2020-10-03 DIAGNOSIS — Z880 Allergy status to penicillin: Secondary | ICD-10-CM | POA: Insufficient documentation

## 2020-10-03 DIAGNOSIS — R053 Chronic cough: Secondary | ICD-10-CM | POA: Insufficient documentation

## 2020-10-03 DIAGNOSIS — Z79890 Hormone replacement therapy (postmenopausal): Secondary | ICD-10-CM | POA: Diagnosis not present

## 2020-10-03 DIAGNOSIS — K3189 Other diseases of stomach and duodenum: Secondary | ICD-10-CM | POA: Diagnosis not present

## 2020-10-03 DIAGNOSIS — K319 Disease of stomach and duodenum, unspecified: Secondary | ICD-10-CM | POA: Diagnosis not present

## 2020-10-03 DIAGNOSIS — Z955 Presence of coronary angioplasty implant and graft: Secondary | ICD-10-CM | POA: Diagnosis not present

## 2020-10-03 DIAGNOSIS — K2950 Unspecified chronic gastritis without bleeding: Secondary | ICD-10-CM | POA: Diagnosis not present

## 2020-10-03 DIAGNOSIS — Z7982 Long term (current) use of aspirin: Secondary | ICD-10-CM | POA: Diagnosis not present

## 2020-10-03 DIAGNOSIS — E785 Hyperlipidemia, unspecified: Secondary | ICD-10-CM | POA: Diagnosis not present

## 2020-10-03 DIAGNOSIS — Z9981 Dependence on supplemental oxygen: Secondary | ICD-10-CM | POA: Diagnosis not present

## 2020-10-03 DIAGNOSIS — K2951 Unspecified chronic gastritis with bleeding: Secondary | ICD-10-CM | POA: Diagnosis not present

## 2020-10-03 DIAGNOSIS — K2270 Barrett's esophagus without dysplasia: Secondary | ICD-10-CM | POA: Diagnosis not present

## 2020-10-03 HISTORY — DX: Personal history of other endocrine, nutritional and metabolic disease: Z86.39

## 2020-10-03 HISTORY — DX: Hypothyroidism, unspecified: E03.9

## 2020-10-03 HISTORY — DX: Atherosclerotic heart disease of native coronary artery without angina pectoris: I25.10

## 2020-10-03 HISTORY — PX: ESOPHAGOGASTRODUODENOSCOPY: SHX5428

## 2020-10-03 SURGERY — EGD (ESOPHAGOGASTRODUODENOSCOPY)
Anesthesia: Monitor Anesthesia Care

## 2020-10-03 MED ORDER — LIDOCAINE HCL (CARDIAC) PF 100 MG/5ML IV SOSY
PREFILLED_SYRINGE | INTRAVENOUS | Status: DC | PRN
  Administered 2020-10-03: 80 mg via INTRAVENOUS

## 2020-10-03 MED ORDER — SODIUM CHLORIDE 0.9 % IV SOLN: INTRAVENOUS | Status: DC

## 2020-10-03 MED ORDER — PROPOFOL 500 MG/50ML IV EMUL
INTRAVENOUS | Status: DC | PRN
  Administered 2020-10-03: 100 ug/kg/min via INTRAVENOUS

## 2020-10-03 MED ORDER — LIDOCAINE HCL (PF) 2 % IJ SOLN
INTRAMUSCULAR | Status: AC
  Filled 2020-10-03: qty 5

## 2020-10-03 MED ORDER — PROPOFOL 10 MG/ML IV BOLUS
INTRAVENOUS | Status: DC | PRN
  Administered 2020-10-03: 20 mg via INTRAVENOUS
  Administered 2020-10-03: 10 mg via INTRAVENOUS
  Administered 2020-10-03: 70 mg via INTRAVENOUS

## 2020-10-03 NOTE — Transfer of Care (Signed)
Immediate Anesthesia Transfer of Care Note  Patient: ZAILA CREW  Procedure(s) Performed: ESOPHAGOGASTRODUODENOSCOPY (EGD)  Patient Location: PACU  Anesthesia Type:MAC  Level of Consciousness: drowsy  Airway & Oxygen Therapy: Patient Spontanous Breathing and Patient connected to nasal cannula oxygen  Post-op Assessment: Report given to RN and Post -op Vital signs reviewed and stable  Post vital signs: Reviewed and stable  Last Vitals:  Vitals Value Taken Time  BP 132/59 10/03/20 1139  Temp 36.4 C 10/03/20 1137  Pulse 83 10/03/20 1139  Resp 18 10/03/20 1139  SpO2 92 % 10/03/20 1139    Last Pain:  Vitals:   10/03/20 1137  TempSrc: Temporal  PainSc:          Complications: No notable events documented.

## 2020-10-03 NOTE — Op Note (Signed)
[facility] Patient Name: Carrie Myers Procedure Date: 10/03/2020 11:10 AM MRN: [id number] Account #: [id number] Date of Birth: Mar 28, 1945 Admit Type: Outpatient Age: 75 Room: [facility] ENDO ROOM 3 Gender: Female Note Status: Finalized Procedure:             Upper GI endoscopy Indications:           Gastro-esophageal reflux disease, Chronic cough Providers:             Andrey Farmer MD, MD Medicines:             Monitored Anesthesia Care Complications:         No immediate complications. Estimated blood loss:                         Minimal. Procedure:             Pre-Anesthesia Assessment:                        - Prior to the procedure, a History and Physical was                         performed, and patient medications and allergies were                         reviewed. The patient is competent. The risks and                         benefits of the procedure and the sedation options and                         risks were discussed with the patient. All questions                         were answered and informed consent was obtained.                         Patient identification and proposed procedure were                         verified by the physician, the nurse, the anesthetist                         and the technician in the endoscopy suite. Mental                         Status Examination: alert and oriented. Airway                         Examination: normal oropharyngeal airway and neck                         mobility. Respiratory Examination: clear to                         auscultation. CV Examination: normal. Prophylactic                         Antibiotics: The patient does not require prophylactic  antibiotics. Prior Anticoagulants: The patient has                         taken no previous anticoagulant or antiplatelet                         agents. ASA Grade Assessment: II - A patient with mild                          systemic disease. After reviewing the risks and                         benefits, the patient was deemed in satisfactory                         condition to undergo the procedure. The anesthesia                         plan was to use monitored anesthesia care (MAC).                         Immediately prior to administration of medications,                         the patient was re-assessed for adequacy to receive                         sedatives. The heart rate, respiratory rate, oxygen                         saturations, blood pressure, adequacy of pulmonary                         ventilation, and response to care were monitored                         throughout the procedure. The physical status of the                         patient was re-assessed after the procedure.                        After obtaining informed consent, the endoscope was                         passed under direct vision. Throughout the procedure,                         the patient's blood pressure, pulse, and oxygen                         saturations were monitored continuously. The Endoscope                         was introduced through the mouth, and advanced to the                         second part of duodenum. The upper GI endoscopy was  accomplished without difficulty. The patient tolerated                         the procedure well. Findings:      The Z-line was found 38 cm from the incisors.      Is[place] of salmon-colored mucosa were present. One single [place] was       noted. The maximum longitudinal extent of these esophageal mucosal       changes was 1 cm in length. Biopsies were taken with a cold forceps for       histology. Estimated blood loss was minimal.      The exam of the esophagus was otherwise normal.      Localized moderate inflammation with hemorrhage characterized by       erythema was found in the gastric antrum. Biopsies were taken  with a       cold forceps for histology. Estimated blood loss was minimal.      The examined duodenum was normal. Impression:            - Z-line, 38 cm from the incisors.                        - Salmon-colored mucosa. Biopsied.                        - Gastritis with hemorrhage. Biopsied.                        - Normal examined duodenum. Recommendation:        - Discharge patient to home.                        - Resume previous diet.                        - Continue present medications.                        - Await pathology results.                        - Return to referring physician as previously                         scheduled. Procedure Code(s):     --- Professional ---                        [phone], Esophagogastroduodenoscopy, flexible,                         transoral; with biopsy, single or multiple Diagnosis Code(s):     --- Professional ---                        K22.8, Other specified diseases of esophagus                        K29.71, Gastritis, unspecified, with bleeding                        K21.9, Gastro-esophageal reflux disease without  esophagitis                        R05, Cough CPT copyright 2019 American Medical Association. All rights reserved. The codes documented in this report are preliminary and upon coder review may  be revised to meet current compliance requirements. Andrey Farmer MD, MD 10/03/2020 11:40:34 AM Number of Addenda: 0 Note Initiated On: 10/03/2020 11:10 AM Estimated Blood Loss:  Estimated blood loss was minimal.      [facility]

## 2020-10-03 NOTE — H&P (Signed)
Outpatient short stay form Pre-procedure 10/03/2020 11:13 AM Carrie Miyamoto MD, MPH  Primary Physician: Dr. Army Melia  Reason for visit:  Chronic Cough  History of present illness:   75 y/o lady with chronic cough that's bette ron PPI but still present. No blood thinners. No family history of GI malignancies. No smoking. No significant symptoms.    Current Facility-Administered Medications:    0.9 %  sodium chloride infusion, , Intravenous, Continuous, Lilyauna Miedema, Hilton Cork, MD, Last Rate: 20 mL/hr at 10/03/20 1050, New Bag at 10/03/20 1050  Medications Prior to Admission  Medication Sig Dispense Refill Last Dose   aspirin EC 81 MG tablet Take 81 mg by mouth daily.   10/02/2020   cyclobenzaprine (FLEXERIL) 10 MG tablet Take 10 mg by mouth 2 (two) times daily.   10/02/2020   diazepam (VALIUM) 5 MG tablet Take 5 mg by mouth at bedtime.   10/02/2020   Diclofenac-miSOPROStol 75-0.2 MG TBEC Take 1 tablet by mouth 2 (two) times daily. 180 tablet 1 10/02/2020   DULoxetine (CYMBALTA) 60 MG capsule TAKE 1 CAPSULE BY MOUTH  DAILY 90 capsule 3 10/02/2020   esomeprazole (NEXIUM) 20 MG capsule Take 40 mg by mouth daily. Over the Counter   10/02/2020   HYDROcodone-acetaminophen (NORCO/VICODIN) 5-325 MG tablet Take 1 tablet by mouth 2 (two) times daily as needed for moderate pain. 45 tablet 0 10/02/2020 at 2300   levothyroxine (SYNTHROID) 88 MCG tablet TAKE 1 TABLET BY MOUTH  DAILY 90 tablet 3 10/02/2020   losartan (COZAAR) 50 MG tablet Take 50 mg by mouth daily.   10/02/2020   mirabegron ER (MYRBETRIQ) 50 MG TB24 tablet Take 1 tablet (50 mg total) by mouth daily. 30 tablet 11 10/02/2020   Multiple Vitamin (MULTI-VITAMINS) TABS Take by mouth.   10/02/2020 at 0800   pantoprazole (PROTONIX) 40 MG tablet Take 1 tablet by mouth 2 (two) times daily.   10/02/2020   pravastatin (PRAVACHOL) 80 MG tablet Take 80 mg by mouth daily.   10/02/2020   pregabalin (LYRICA) 50 MG capsule Take 50 mg by mouth 2 (two) times daily.    10/02/2020   tolterodine (DETROL LA) 4 MG 24 hr capsule TAKE 1 CAPSULE(4 MG) BY MOUTH DAILY 90 capsule 3 10/02/2020   Armodafinil 150 MG tablet Take 150 mg by mouth daily.      citalopram (CELEXA) 20 MG tablet TAKE 1 TABLET BY MOUTH  DAILY (Patient not taking: Reported on 10/03/2020) 90 tablet 3 Not Taking   COMBIVENT RESPIMAT 20-100 MCG/ACT AERS respimat SMARTSIG:2 Inhalation Via Inhaler 4 Times Daily PRN      ipratropium (ATROVENT) 0.06 % nasal spray Place into both nostrils.      Loperamide HCl (IMODIUM PO) Take by mouth as needed.      loratadine (CLARITIN) 10 MG tablet Take 10 mg by mouth daily.      traMADol (ULTRAM) 50 MG tablet Take by mouth 3 (three) times daily. (Patient not taking: Reported on 10/03/2020)   Completed Course     Allergies  Allergen Reactions   Augmentin [Amoxicillin-Pot Clavulanate] Nausea And Vomiting   Codeine Nausea And Vomiting   Sulfa Antibiotics Nausea And Vomiting   Proparacaine Itching   Shellfish Allergy Nausea And Vomiting    No issues with iodine      Past Medical History:  Diagnosis Date   Allergy    Anxiety    Arthritis    neck, hands, lower back   Bilateral dry eyes    Chronic low back  pain 12/23/2016   Coronary artery disease    DDD (degenerative disc disease), lumbar 10/02/2014   Dental crowns present    Implants, front "flipper" while waiting for other implants   Emphysema of lung ([place])    Fibromyalgia    Fibromyalgia    Fracture of right foot 01/14/2015   GERD (gastroesophageal reflux disease)    History of Graves' disease    Hyperlipidemia    Hypertension    Hypothyroidism    Myocardial infarction ([place]) 04/22/2006   Osteoarthritis    Other shoulder lesions, right shoulder 04/17/2015   Rheumatoid arthritis ([place])    Sciatica, left side 01/01/2019   Sleep apnea    CPAP   Status post shoulder surgery 05/27/2015   Thyroid disease    Vertigo    none for approx 9 yrs    Review of systems:  Otherwise negative.    Physical  Exam  Gen: Alert, oriented. Appears stated age.  HEENT: PERRLA. Lungs: No respiratory distress CV: RRR Abd: soft, benign, no masses Ext: No edema    Planned procedures: Proceed with EGD. The patient understands the nature of the planned procedure, indications, risks, alternatives and potential complications including but not limited to bleeding, infection, perforation, damage to internal organs and possible oversedation/side effects from anesthesia. The patient agrees and gives consent to proceed.  Please refer to procedure notes for findings, recommendations and patient disposition/instructions.     Carrie Miyamoto MD, MPH Gastroenterology 10/03/2020  11:13 AM

## 2020-10-03 NOTE — Interval H&P Note (Signed)
History and Physical Interval Note:  10/03/2020 11:17 AM  Carrie Myers  has presented today for surgery, with the diagnosis of GERD.  The various methods of treatment have been discussed with the patient and family. After consideration of risks, benefits and other options for treatment, the patient has consented to  Procedure(s) with comments: ESOPHAGOGASTRODUODENOSCOPY (EGD) (N/A) - REQUESTING MORNING AFTER 9 AM as a surgical intervention.  The patient's history has been reviewed, patient examined, no change in status, stable for surgery.  I have reviewed the patient's chart and labs.  Questions were answered to the patient's satisfaction.     Lesly Rubenstein  Ok to proceed with EGD

## 2020-10-03 NOTE — Anesthesia Preprocedure Evaluation (Signed)
Anesthesia Evaluation  Patient identified by MRN, date of birth, ID band Patient awake    Reviewed: Allergy & Precautions, NPO status , Patient's Chart, lab work & pertinent test results  History of Anesthesia Complications Negative for: history of anesthetic complications  Airway Mallampati: II  TM Distance: <3 FB Neck ROM: Full    Dental no notable dental hx. (+) Teeth Intact   Pulmonary sleep apnea , COPD,  COPD inhaler and oxygen dependent, former smoker,    Pulmonary exam normal breath sounds clear to auscultation       Cardiovascular Exercise Tolerance: Good METS: 3 - Mets hypertension, + CAD (Ax- denies anginia;  Has 1 stent 14 yrs ago;  Neg recent EST) and + Past MI  Normal cardiovascular exam Rhythm:Regular Rate:Normal     Neuro/Psych PSYCHIATRIC DISORDERS Anxiety Depression  Neuromuscular disease    GI/Hepatic Neg liver ROS, GERD (rule out for chronic cough-  no "heartburn")  ,  Endo/Other  negative endocrine ROS  Renal/GU negative Renal ROS  negative genitourinary   Musculoskeletal  (+) Arthritis , Fibromyalgia -  Abdominal   Peds  Hematology negative hematology ROS (+)   Anesthesia Other Findings   Reproductive/Obstetrics negative OB ROS                             Anesthesia Physical Anesthesia Plan  ASA: 3  Anesthesia Plan: MAC   Post-op Pain Management:    Induction: Intravenous  PONV Risk Score and Plan:   Airway Management Planned: Natural Airway and Nasal Cannula  Additional Equipment:   Intra-op Plan:   Post-operative Plan:   Informed Consent: I have reviewed the patients History and Physical, chart, labs and discussed the procedure including the risks, benefits and alternatives for the proposed anesthesia with the patient or authorized representative who has indicated his/her understanding and acceptance.     Dental Advisory Given  Plan Discussed  with: Anesthesiologist, CRNA and Surgeon  Anesthesia Plan Comments: (Patient consented for risks of anesthesia including but not limited to:  - adverse reactions to medications - damage to eyes, teeth, lips or other oral mucosa - nerve damage due to positioning  - sore throat or hoarseness - Damage to heart, brain, nerves, lungs, other parts of body or loss of life  Patient voiced understanding.)        Anesthesia Quick Evaluation

## 2020-10-04 NOTE — Anesthesia Postprocedure Evaluation (Signed)
Anesthesia Post Note  Patient: Carrie Myers  Procedure(s) Performed: ESOPHAGOGASTRODUODENOSCOPY (EGD)  Patient location during evaluation: PACU Anesthesia Type: MAC Level of consciousness: awake and alert Pain management: pain level controlled Vital Signs Assessment: post-procedure vital signs reviewed and stable Respiratory status: spontaneous breathing, nonlabored ventilation, respiratory function stable and patient connected to nasal cannula oxygen Cardiovascular status: stable and blood pressure returned to baseline Postop Assessment: no apparent nausea or vomiting Anesthetic complications: no   No notable events documented.   Last Vitals:  Vitals:   10/03/20 1147 10/03/20 1157  BP: (!) 126/57 126/71  Pulse: 72 67  Resp: (!) 22 20  Temp:    SpO2: 95% 94%    Last Pain:  Vitals:   10/04/20 1358  TempSrc:   PainSc: 0-No pain                 Tonny Bollman

## 2020-10-06 ENCOUNTER — Encounter: Payer: Self-pay | Admitting: Gastroenterology

## 2020-10-07 LAB — SURGICAL PATHOLOGY

## 2020-10-08 DIAGNOSIS — H26491 Other secondary cataract, right eye: Secondary | ICD-10-CM | POA: Diagnosis not present

## 2020-10-21 ENCOUNTER — Other Ambulatory Visit: Payer: Self-pay | Admitting: Obstetrics & Gynecology

## 2020-10-21 ENCOUNTER — Telehealth: Payer: Self-pay

## 2020-10-21 DIAGNOSIS — I2510 Atherosclerotic heart disease of native coronary artery without angina pectoris: Secondary | ICD-10-CM | POA: Diagnosis not present

## 2020-10-21 DIAGNOSIS — I10 Essential (primary) hypertension: Secondary | ICD-10-CM | POA: Diagnosis not present

## 2020-10-21 DIAGNOSIS — E782 Mixed hyperlipidemia: Secondary | ICD-10-CM | POA: Diagnosis not present

## 2020-10-21 DIAGNOSIS — I6523 Occlusion and stenosis of bilateral carotid arteries: Secondary | ICD-10-CM | POA: Diagnosis not present

## 2020-10-21 NOTE — Telephone Encounter (Signed)
Hi Dr. Kenton Kingfisher, this pt is currently taking a medication for bladder control -Myrbetric( Sp??)  She said that her copay has increased from $47.00 to $114. per month.  She has spoken with Medicare and was told that if the RX was moved from Tier 3 to Tier 1 or 2, that would help with the cost.  OR she is wondering if there is another rx that she can take in place of this rx.

## 2020-10-21 NOTE — Telephone Encounter (Signed)
Per Dr Kenton Kingfisher, inquire if patient would prefer to go back on Detrol (used in past and still actually listed on her med list).  I cannot change a medicine from one tier to another, so unclear as to pt request there.

## 2020-10-21 NOTE — Telephone Encounter (Signed)
LMVM TRC. 

## 2020-10-24 NOTE — Telephone Encounter (Signed)
Spoke w/patient. Advised of Dr. Kenton Kingfisher inquiry of her going back on Detrol. She will call insurance to see what tier Detrol is on and contact pharmacy for pricing. She will contact us back next week with a decision and schedule virtual appointment at that time if she needs to.

## 2020-10-24 NOTE — Telephone Encounter (Signed)
Patient would like to schedule a virtual visit with Dr. Kenton Kingfisher about weaning her off bladder control medication. [id number]

## 2020-10-29 ENCOUNTER — Encounter: Payer: Self-pay | Admitting: Dermatology

## 2020-10-29 ENCOUNTER — Other Ambulatory Visit: Payer: Self-pay

## 2020-10-29 ENCOUNTER — Ambulatory Visit (INDEPENDENT_AMBULATORY_CARE_PROVIDER_SITE_OTHER): Payer: Medicare Other | Admitting: Dermatology

## 2020-10-29 DIAGNOSIS — I2510 Atherosclerotic heart disease of native coronary artery without angina pectoris: Secondary | ICD-10-CM | POA: Diagnosis not present

## 2020-10-29 DIAGNOSIS — L700 Acne vulgaris: Secondary | ICD-10-CM | POA: Diagnosis not present

## 2020-10-29 DIAGNOSIS — L219 Seborrheic dermatitis, unspecified: Secondary | ICD-10-CM | POA: Diagnosis not present

## 2020-10-29 MED ORDER — FLUOCINONIDE 0.05 % EX SOLN: CUTANEOUS | 2 refills | Status: DC

## 2020-10-29 MED ORDER — KETOCONAZOLE 2 % EX SHAM: MEDICATED_SHAMPOO | CUTANEOUS | 3 refills | Status: DC

## 2020-10-29 MED ORDER — PIMECROLIMUS 1 % EX CREA: TOPICAL_CREAM | CUTANEOUS | 2 refills | Status: DC

## 2020-10-29 MED ORDER — ADAPALENE 0.3 % EX GEL: CUTANEOUS | 3 refills | Status: DC

## 2020-10-29 NOTE — Progress Notes (Signed)
   New Patient Visit  Subjective  Carrie Myers is a 75 y.o. female who presents for the following: Rash (Forehead. Itching. Dur: 6-8 months. No Rx Tx. Washes scalp/forehead with tar shampoo. States has H/O psoriasis on scalp. ). Also has longstanding acne on back.    Objective  Well appearing patient in no apparent distress; mood and affect are within normal limits.  Review of Systems: No other skin or systemic complaints except as noted in HPI or Assessment and Plan.   A focused examination was performed including head, including the scalp, face, neck, nose, ears, eyelids, and lips. Relevant physical exam findings are noted in the Assessment and Plan.  frontal hair line, scalp Mild erythema at occipital scalp. Light pink slightly scaly patches on forehead and right upper eyelid.   back Multiple open comedones, small cysts, pitted scars  Assessment & Plan  Seborrheic dermatitis frontal hair line, scalp  With flare Start Pimecrolimus BID cream to affected areas on face/eyelids until itchy rash cleared. Start Ketoconazole 2% shampoo 2-3x/week as directed to scalp and forehead, let sit several minutes prior to rinsing .  Start fluocinonide solution to aas scalp qd/bid prn itch  Recommend mild soap and moisturizing cream 1-2 times daily to face.      Seborrheic Dermatitis  -  is a chronic persistent rash characterized by pinkness and scaling most commonly of the mid face but also can occur on the scalp (dandruff), ears; mid chest and mid back. It tends to be exacerbated by stress and cooler weather.  People who have neurologic disease may experience new onset or exacerbation of existing seborrheic dermatitis.  The condition is not curable but treatable and can be controlled.   pimecrolimus (ELIDEL) 1 % cream - frontal hair line, scalp Apply BID to affected areas on face/eyelids  ketoconazole (NIZORAL) 2 % shampoo - frontal hair line, scalp 1-2 times a week lather on  scalp/forehead, leave on 5-8 minutes, rinse well  fluocinonide (LIDEX) 0.05 % external solution - frontal hair line, scalp Apply BID to affected areas on scalp PRN itching  Acne vulgaris back  Adapalene 0.3% gel qhs.  Topical retinoid medications like tretinoin/Retin-A, adapalene/Differin, tazarotene/Fabior, and Epiduo/Epiduo Forte can cause dryness and irritation when first started. Only apply a pea-sized amount to the entire affected area. Avoid applying it around the eyes, edges of mouth and creases at the nose. If you experience irritation, use a good moisturizer first and/or apply the medicine less often. If you are doing well with the medicine, you can increase how often you use it until you are applying every night. Be careful with sun protection while using this medication as it can make you sensitive to the sun. This medicine should not be used by pregnant women.    Adapalene (DIFFERIN) 0.3 % gel - back Apply to back qhs, wash off qam  Return if symptoms worsen or fail to improve.  I, Emelia Salisbury, CMA, am acting as scribe for Brendolyn Patty, MD.  Documentation: I have reviewed the above documentation for accuracy and completeness, and I agree with the above.  Brendolyn Patty MD

## 2020-10-29 NOTE — Patient Instructions (Addendum)
Pimecrolimus twice daily to affected areas on face/eyelids.  Ketoconazole shampoo 1-2 times a week, lather on scalp, leave on 5-8 minutes, rinse well.   Seborrheic Dermatitis  -  is a chronic persistent rash characterized by pinkness and scaling most commonly of the mid face but also can occur on the scalp (dandruff), ears; mid chest and mid back. It tends to be exacerbated by stress and cooler weather.  People who have neurologic disease may experience new onset or exacerbation of existing seborrheic dermatitis.  The condition is not curable but treatable and can be controlled.  Topical steroids (such as triamcinolone, fluocinolone, fluocinonide, mometasone, clobetasol, halobetasol, betamethasone, hydrocortisone) can cause thinning and lightening of the skin if they are used for too long in the same area. Your physician has selected the right strength medicine for your problem and area affected on the body. Please use your medication only as directed by your physician to prevent side effects.   Topical retinoid medications like tretinoin/Retin-A, adapalene/Differin, tazarotene/Fabior, and Epiduo/Epiduo Forte can cause dryness and irritation when first started. Only apply a pea-sized amount to the entire affected area. Avoid applying it around the eyes, edges of mouth and creases at the nose. If you experience irritation, use a good moisturizer first and/or apply the medicine less often. If you are doing well with the medicine, you can increase how often you use it until you are applying every night. Be careful with sun protection while using this medication as it can make you sensitive to the sun. This medicine should not be used by pregnant women.    If you have any questions or concerns for your doctor, please call our main line at [phone] and press option 4 to reach your doctor's medical assistant. If no one answers, please leave a voicemail as directed and we will return your call as soon as  possible. Messages left after 4 pm will be answered the following business day.   You may also send [place] a message via [place]. We typically respond to MyChart messages within 1-2 business days.  For prescription refills, please ask your pharmacy to contact our office. Our fax number is [phone].  If you have an urgent issue when the clinic is closed that cannot wait until the next business day, you can page your doctor at the number below.    Please note that while we do our best to be available for urgent issues outside of office hours, we are not available 24/7.   If you have an urgent issue and are unable to reach [place], you may choose to seek medical care at your doctor's office, retail clinic, urgent care center, or emergency room.  If you have a medical emergency, please immediately call 911 or go to the emergency department.  Pager Numbers  - Dr. Nehemiah Massed: [phone]  - Dr. Laurence Ferrari: [phone]  - Dr. Nicole Kindred: [phone]  In the event of inclement weather, please call our main line at [phone] for an update on the status of any delays or closures.  Dermatology Medication Tips: Please keep the boxes that topical medications come in in order to help keep track of the instructions about where and how to use these. Pharmacies typically print the medication instructions only on the boxes and not directly on the medication tubes.   If your medication is too expensive, please contact our office at [phone] option 4 or send [place] a message through [place].   We are unable to tell what your co-pay for medications  will be in advance as this is different depending on your insurance coverage. However, we may be able to find a substitute medication at lower cost or fill out paperwork to get insurance to cover a needed medication.   If a prior authorization is required to get your medication covered by your insurance company, please allow [place] 1-2 business days to complete this  process.  Drug prices often vary depending on where the prescription is filled and some pharmacies may offer cheaper prices.  The website www.goodrx.com contains coupons for medications through different pharmacies. The prices here do not account for what the cost may be with help from insurance (it may be cheaper with your insurance), but the website can give you the price if you did not use any insurance.  - You can print the associated coupon and take it with your prescription to the pharmacy.  - You may also stop by our office during regular business hours and pick up a GoodRx coupon card.  - If you need your prescription sent electronically to a different pharmacy, notify our office through [facility] or by phone at [phone] option 4.

## 2020-10-30 ENCOUNTER — Telehealth: Payer: Self-pay

## 2020-10-30 NOTE — Telephone Encounter (Signed)
Pimecrolimus not covered by insurance. Alternatives listed - Hydrocortisone Desonide oint Augmented betamethasone Fluocinonide cream 0.05%  Adapalene also not covered. Formulary alternatives -  Clindamycin-benzoyl peroxide 1-5% Erythromycin-benzoyl peroxide Tazarotene cream Tretinoin cream  Please advise.

## 2020-11-03 MED ORDER — HYDROCORTISONE 2.5 % EX CREA: TOPICAL_CREAM | Freq: Two times a day (BID) | CUTANEOUS | 2 refills | Status: DC | PRN

## 2020-11-03 NOTE — Telephone Encounter (Signed)
Patient advised of medication change per Dr. Nicole Kindred. Hydrocortisone sent in and patient advised. She has declined any treatment for her back at this time.

## 2020-11-03 NOTE — Addendum Note (Signed)
Addended by: Johnsie Kindred R on: 11/03/2020 09:10 AM   Modules accepted: Orders

## 2020-11-10 ENCOUNTER — Other Ambulatory Visit: Payer: Self-pay | Admitting: Anesthesiology

## 2020-11-11 ENCOUNTER — Ambulatory Visit: Payer: Medicare Other | Attending: Anesthesiology | Admitting: Anesthesiology

## 2020-11-11 ENCOUNTER — Encounter: Payer: Self-pay | Admitting: Anesthesiology

## 2020-11-11 ENCOUNTER — Other Ambulatory Visit: Payer: Self-pay

## 2020-11-11 VITALS — BP 136/81 | HR 86 | Temp 97.0°F | Resp 16 | Ht 68.0 in | Wt 180.0 lb

## 2020-11-11 DIAGNOSIS — M5442 Lumbago with sciatica, left side: Secondary | ICD-10-CM

## 2020-11-11 DIAGNOSIS — G8929 Other chronic pain: Secondary | ICD-10-CM

## 2020-11-11 DIAGNOSIS — M5441 Lumbago with sciatica, right side: Secondary | ICD-10-CM

## 2020-11-11 DIAGNOSIS — M47816 Spondylosis without myelopathy or radiculopathy, lumbar region: Secondary | ICD-10-CM

## 2020-11-11 DIAGNOSIS — M159 Polyosteoarthritis, unspecified: Secondary | ICD-10-CM | POA: Diagnosis present

## 2020-11-11 DIAGNOSIS — G894 Chronic pain syndrome: Secondary | ICD-10-CM

## 2020-11-11 DIAGNOSIS — M797 Fibromyalgia: Secondary | ICD-10-CM

## 2020-11-11 DIAGNOSIS — M542 Cervicalgia: Secondary | ICD-10-CM | POA: Diagnosis present

## 2020-11-11 DIAGNOSIS — M51369 Other intervertebral disc degeneration, lumbar region without mention of lumbar back pain or lower extremity pain: Secondary | ICD-10-CM

## 2020-11-11 DIAGNOSIS — M47812 Spondylosis without myelopathy or radiculopathy, cervical region: Secondary | ICD-10-CM | POA: Diagnosis present

## 2020-11-11 DIAGNOSIS — M545 Low back pain, unspecified: Secondary | ICD-10-CM

## 2020-11-11 DIAGNOSIS — I2510 Atherosclerotic heart disease of native coronary artery without angina pectoris: Secondary | ICD-10-CM

## 2020-11-11 DIAGNOSIS — F1190 Opioid use, unspecified, uncomplicated: Secondary | ICD-10-CM

## 2020-11-11 DIAGNOSIS — M5136 Other intervertebral disc degeneration, lumbar region: Secondary | ICD-10-CM

## 2020-11-11 MED ORDER — HYDROCODONE-ACETAMINOPHEN 5-325 MG PO TABS: 1.0000 | ORAL_TABLET | Freq: Two times a day (BID) | ORAL | 0 refills | Status: DC

## 2020-11-11 MED ORDER — HYDROCODONE-ACETAMINOPHEN 5-325 MG PO TABS: 1.0000 | ORAL_TABLET | Freq: Two times a day (BID) | ORAL | 0 refills | Status: DC | PRN

## 2020-11-11 MED ORDER — DIAZEPAM 5 MG PO TABS: 5.0000 mg | ORAL_TABLET | Freq: Every day | ORAL | 3 refills | Status: AC

## 2020-11-11 NOTE — Progress Notes (Signed)
Nursing Pain Medication Assessment:  Safety precautions to be maintained throughout the outpatient stay will include: orient to surroundings, keep bed in low position, maintain call bell within reach at all times, provide assistance with transfer out of bed and ambulation.  Medication Inspection Compliance: Pill count conducted under aseptic conditions, in front of the patient. Neither the pills nor the bottle was removed from the patient's sight at any time. Once count was completed pills were immediately returned to the patient in their original bottle.  Medication: Hydrocodone/APAP Pill/Patch Count:  0 of 45 pills remain Pill/Patch Appearance: Markings consistent with prescribed medication Bottle Appearance: Standard pharmacy container. Clearly labeled. Filled Date: 07 / 15 / 2022 Last Medication intake:  Yesterday Diazepam 5 mg 0/30 Filled 09/26/2020 Last took 3 days ago

## 2020-11-11 NOTE — Patient Instructions (Addendum)
Preparing for your procedure (without sedation) Instructions: Oral Intake: Do not eat or drink anything for at least 3 hours prior to your procedure. Transportation: Unless otherwise stated by your physician, you may drive yourself after the procedure. Blood Pressure Medicine: Take your blood pressure medicine with a sip of water the morning of the procedure. Insulin: Take only  of your normal insulin dose. Preventing infections: Shower with an antibacterial soap the morning of your procedure. Build-up your immune system: Take 1000 mg of Vitamin C with every meal (3 times a day) the day prior to your procedure. Pregnancy: If you are pregnant, call and cancel the procedure. Sickness: If you have a cold, fever, or any active infections, call and cancel the procedure. Arrival: You must be in the facility at least 30 minutes prior to your scheduled procedure. Children: Do not bring any children with you. Dress appropriately: Bring dark clothing that you would not mind if they get stained. Valuables: Do not bring any jewelry or valuables. Procedure appointments are reserved for interventional treatments only. No Prescription Refills. No medication changes will be discussed during procedure appointments. No disability issues will be discussed.     Aleve-   verbal order per Dr Andree Elk Take 2 tablets twice a day for 1 week....then 1 tablet twice a day for 1 week...  You may continue the 1 tablet daily if it helps.

## 2020-11-12 DIAGNOSIS — Z20822 Contact with and (suspected) exposure to covid-19: Secondary | ICD-10-CM | POA: Diagnosis not present

## 2020-11-14 ENCOUNTER — Ambulatory Visit (INDEPENDENT_AMBULATORY_CARE_PROVIDER_SITE_OTHER): Payer: Medicare Other | Admitting: Internal Medicine

## 2020-11-14 ENCOUNTER — Ambulatory Visit
Admission: RE | Admit: 2020-11-14 | Discharge: 2020-11-14 | Disposition: A | Discharge status: Home / Self Care | Payer: Medicare Other | Source: Ambulatory Visit | Attending: Internal Medicine | Admitting: Internal Medicine

## 2020-11-14 ENCOUNTER — Other Ambulatory Visit: Payer: Self-pay

## 2020-11-14 ENCOUNTER — Ambulatory Visit
Admission: RE | Admit: 2020-11-14 | Discharge: 2020-11-14 | Disposition: A | Discharge status: Home / Self Care | Payer: Medicare Other | Attending: Internal Medicine | Admitting: Internal Medicine

## 2020-11-14 ENCOUNTER — Encounter: Payer: Self-pay | Admitting: Internal Medicine

## 2020-11-14 VITALS — BP 134/72 | HR 95 | Temp 98.3°F | Ht 68.0 in | Wt 179.0 lb

## 2020-11-14 DIAGNOSIS — I2510 Atherosclerotic heart disease of native coronary artery without angina pectoris: Secondary | ICD-10-CM

## 2020-11-14 DIAGNOSIS — M25561 Pain in right knee: Secondary | ICD-10-CM

## 2020-11-14 NOTE — Progress Notes (Signed)
Date:  11/14/2020   Name:  Carrie Myers   DOB:  Oct 16, 1945   MRN:  [id number]   Chief Complaint: Cyst (X 6 weeks, Behind right knee, went to UC in July, had a [place] and it was not a blood clot, took 6 days of prednisone and it went down, has gotten bigger after that, ankle swelling, painful to walk on)  Knee Pain  The incident occurred more than 1 week ago. There was no injury mechanism. The pain is present in the right leg and right knee. The quality of the pain is described as aching. Treatments tried: had US showing no DVT but large Baker's cyst vs hematoma/ steroid taper helped but discomfort returned.   IMPRESSION: 09/27/20 1. No evidence of right lower extremity DVT. 2. 3.5 cm complex fluid collection in the right popliteal fossa extending into the medial right calf. Differential considerations include ruptured Baker cyst or hematoma.  Lab Results  Component Value Date   CREATININE 0.94 09/05/2020   BUN 27 09/05/2020   NA 138 09/05/2020   K 4.8 09/05/2020   CL 98 09/05/2020   CO2 24 09/05/2020   Lab Results  Component Value Date   CHOL 147 03/28/2020   HDL 50 03/28/2020   LDLCALC 70 03/28/2020   TRIG 156 (H) 03/28/2020   CHOLHDL 2.9 03/28/2020   Lab Results  Component Value Date   TSH 2.160 03/28/2020   Lab Results  Component Value Date   HGBA1C 6.1 (H) 09/05/2020   Lab Results  Component Value Date   WBC 6.5 03/28/2020   HGB 13.2 03/28/2020   HCT 38.2 03/28/2020   MCV 88 03/28/2020   PLT 290 03/28/2020   Lab Results  Component Value Date   ALT 36 (H) 03/28/2020   AST 35 03/28/2020   ALKPHOS 80 03/28/2020   BILITOT 0.3 03/28/2020     Review of Systems  Constitutional:  Negative for chills, fatigue and fever.  Cardiovascular:  Negative for chest pain and leg swelling (resolved).  Musculoskeletal:  Positive for arthralgias and gait problem.  Neurological:  Negative for dizziness.   Patient Active Problem List   Diagnosis Date Noted    Overactive bladder 06/18/2020   Prediabetes 04/14/2020   Slow transit constipation 06/05/2019   Sciatica, left side 01/01/2019   Diverticulosis of colon 04/25/2018   Change in bowel habits    Tobacco use disorder, moderate, in sustained remission 12/22/2017   Moderate episode of recurrent major depressive disorder ([place]) 10/03/2017   Inflammatory spondylopathy of lumbar region ([place]) 10/03/2017   Hoarseness, persistent 04/18/2017   OSA on CPAP 02/18/2017   Cervical disc disease 12/31/2016   Generalized osteoarthritis 12/23/2016   Chronic pain syndrome 12/23/2016   Cystocele, midline 12/06/2016   Urinary incontinence 12/06/2016   Impingement syndrome of right shoulder 04/17/2015   Fibrocystic breast 01/18/2015   Gastroesophageal reflux disease without esophagitis 01/18/2015   Hypothyroidism, postablative 01/18/2015   Arteriosclerosis of coronary artery 12/29/2014   DJD of shoulder 11/13/2014   Fibromyalgia 10/02/2014   Intercostal neuralgia 10/02/2014   Facet syndrome, lumbar 10/02/2014   Sacroiliac joint disease 10/02/2014   Carotid artery narrowing 07/09/2014   Benign essential HTN 07/01/2014   Osteoarthritis of thumb 09/24/2012   Mixed hyperlipidemia 09/22/2012   Arthritis of hand, degenerative 09/22/2012    Allergies  Allergen Reactions   Augmentin [Amoxicillin-Pot Clavulanate] Nausea And Vomiting   Codeine Nausea And Vomiting   Sulfa Antibiotics Nausea And Vomiting   Proparacaine Itching  Shellfish Allergy Nausea And Vomiting    No issues with iodine     Past Surgical History:  Procedure Laterality Date   ABDOMINAL HYSTERECTOMY     total   APPENDECTOMY     BREAST BIOPSY Right    neg   BREAST CYST ASPIRATION Left    neg   BUNIONECTOMY Bilateral    CESAREAN SECTION     COLONOSCOPY  2010   normal   COLONOSCOPY WITH PROPOFOL N/A 04/03/2018   Procedure: COLONOSCOPY WITH BIOPSIES;  Surgeon: Lucilla Lame, MD;  Location: [place];  Service: Endoscopy;   Laterality: N/A;  sleep apnea   CORONARY ANGIOPLASTY WITH STENT PLACEMENT  04/2006   dental implant     [age]   DILATION AND CURETTAGE OF UTERUS     epidural steroid injection  2018   ESOPHAGOGASTRODUODENOSCOPY N/A 10/03/2020   Procedure: ESOPHAGOGASTRODUODENOSCOPY (EGD);  Surgeon: Lesly Rubenstein, MD;  Location: [facility] ENDOSCOPY;  Service: Endoscopy;  Laterality: N/A;  REQUESTING MORNING AFTER 9 AM   EYE SURGERY     bilateral cataract extraction   SHOULDER ARTHROSCOPY Right 05/07/2015   Procedure: Extensive arthroscopic debridement and arthroscopic subacromial decompression, right shoulder.                        2.  Steroid injection right thumb CMC joint.;  Surgeon: Corky Mull, MD;  Location: [place];  Service: Orthopedics;  Laterality: Right;   TOOTH EXTRACTION     Upper centrals and lateral    Social History   Tobacco Use   Smoking status: Former    Packs/day: 1.25    Years: 44.00    Pack years: 55.00    Types: Cigarettes    Quit date: 04/22/2006    Years since quitting: 14.5   Smokeless tobacco: Never   Tobacco comments:       Vaping Use   Vaping Use: Never used  Substance Use Topics   Alcohol use: No    Comment: occasional   Drug use: No     Medication list has been reviewed and updated.  Current Meds  Medication Sig   Armodafinil 150 MG tablet Take 150 mg by mouth daily.   aspirin EC 81 MG tablet Take 81 mg by mouth daily.   citalopram (CELEXA) 20 MG tablet TAKE 1 TABLET BY MOUTH  DAILY   COMBIVENT RESPIMAT 20-100 MCG/ACT AERS respimat SMARTSIG:2 Inhalation Via Inhaler 4 Times Daily PRN   cyclobenzaprine (FLEXERIL) 10 MG tablet Take 10 mg by mouth 2 (two) times daily.   diazepam (VALIUM) 5 MG tablet Take 1 tablet (5 mg total) by mouth at bedtime.   Diclofenac-miSOPROStol 75-0.2 MG TBEC Take 1 tablet by mouth 2 (two) times daily.   DULoxetine (CYMBALTA) 60 MG capsule TAKE 1 CAPSULE BY MOUTH  DAILY   esomeprazole (NEXIUM) 20 MG capsule Take 40 mg  by mouth daily. Over the Counter   fluocinonide (LIDEX) 0.05 % external solution Apply BID to affected areas on scalp PRN itching   HYDROcodone-acetaminophen (NORCO/VICODIN) 5-325 MG tablet Take 1 tablet by mouth 2 (two) times daily as needed for moderate pain.   [START ON 12/10/2020] HYDROcodone-acetaminophen (NORCO/VICODIN) 5-325 MG tablet Take 1 tablet by mouth 2 (two) times daily.   hydrocortisone 2.5 % cream Apply topically 2 (two) times daily as needed (Rash).   ipratropium (ATROVENT) 0.06 % nasal spray Place into both nostrils.   ketoconazole (NIZORAL) 2 % shampoo 1-2 times a week lather  on scalp/forehead, leave on 5-8 minutes, rinse well   levocetirizine (XYZAL) 5 MG tablet Take by mouth.   levothyroxine (SYNTHROID) 88 MCG tablet TAKE 1 TABLET BY MOUTH  DAILY   Loperamide HCl (IMODIUM PO) Take by mouth as needed.   losartan (COZAAR) 50 MG tablet Take 50 mg by mouth daily.   mirabegron ER (MYRBETRIQ) 50 MG TB24 tablet Take 1 tablet (50 mg total) by mouth daily.   Multiple Vitamin (MULTI-VITAMINS) TABS Take by mouth.   pantoprazole (PROTONIX) 40 MG tablet Take 1 tablet by mouth 2 (two) times daily.   pimecrolimus (ELIDEL) 1 % cream Apply BID to affected areas on face/eyelids   pravastatin (PRAVACHOL) 80 MG tablet Take 80 mg by mouth daily.   pregabalin (LYRICA) 50 MG capsule Take 50 mg by mouth 2 (two) times daily.   tolterodine (DETROL LA) 4 MG 24 hr capsule TAKE 1 CAPSULE(4 MG) BY MOUTH DAILY   [DISCONTINUED] loratadine (CLARITIN) 10 MG tablet Take 10 mg by mouth daily.    PHQ 2/9 Scores 11/11/2020 09/05/2020 05/12/2020 03/28/2020  PHQ - 2 Score 0 0 0 0  PHQ- 9 Score - 0 - 0  Exception Documentation - - - -    GAD 7 : Generalized Anxiety Score 09/05/2020 03/28/2020 12/07/2019 06/05/2019  Nervous, Anxious, on Edge 0 0 0 0  Control/stop worrying 0 0 0 0  Worry too much - different things 0 0 0 0  Trouble relaxing 0 0 0 0  Restless 0 0 0 0  Easily annoyed or irritable 0 0 0 0  Afraid -  awful might happen 0 0 0 0  Total GAD 7 Score 0 0 0 0  Anxiety Difficulty - - Not difficult at all Not difficult at all    BP Readings from Last 3 Encounters:  11/14/20 134/72  11/11/20 136/81  10/03/20 126/71    Physical Exam Vitals and nursing note reviewed.  Constitutional:      General: She is not in acute distress.    Appearance: She is well-developed.  HENT:     Head: Normocephalic and atraumatic.  Cardiovascular:     Rate and Rhythm: Normal rate and regular rhythm.  Pulmonary:     Effort: Pulmonary effort is normal. No respiratory distress.     Breath sounds: No wheezing or rhonchi.  Musculoskeletal:     Right knee: Bony tenderness and crepitus present. No effusion. Normal range of motion. Normal alignment.     Left knee: Normal.     Comments: Tender along the medial joint line  Skin:    General: Skin is warm and dry.     Findings: No rash.  Neurological:     Mental Status: She is alert and oriented to person, place, and time.  Psychiatric:        Mood and Affect: Mood normal.        Behavior: Behavior normal.    Wt Readings from Last 3 Encounters:  11/14/20 179 lb (81.2 kg)  11/11/20 180 lb (81.6 kg)  10/03/20 180 lb (81.6 kg)    BP 134/72   Pulse 95   Temp 98.3 F (36.8 C) (Oral)   Ht '5\' 8"'$  (1.727 m)   Wt 179 lb (81.2 kg)   SpO2 98%   BMI 27.22 kg/m   Assessment and Plan: 1. Acute pain of right knee Suspect she has primary OA of the knee Doubt that the Baker's cyst is causing discomfort since it apparently ruptured Will get plain films  then recommend either SM or Ortho accordingly - DG Knee Complete 4 Views Right; Future   Partially dictated using Dragon software. Any errors are unintentional.  Halina Maidens, MD [facility]  11/14/2020

## 2020-11-14 NOTE — Progress Notes (Signed)
Subjective:  Patient ID: Carrie Myers, female    DOB: October 03, 1945  Age: 75 y.o. MRN: [id number]  CC: Back Pain (low), Neck Pain, and Knee Pain (right)   Procedure: None  HPI Carrie Myers presents for reevaluation.  She states that she is doing reasonably well with her low back pain and diffuse body pain with her fibromyalgia.  The regimen that she is currently on she feels is working well.  She still taking 1 Valium at nighttime which helps with sleep and sedation for insomnia.  She takes her hydrocodone once a day and this continues to help with her persistent low back pain.  She is doing her stretching strengthening exercises as tolerated and these generally help however she has episodes where the pain does get intense.  Otherwise she reports being in her usual state of health.  No changes in lower extremity strength or function or bowel or bladder function are noted.  She tolerates her opioid medications without side effect as well and continues to derive good functional benefit from them.  She denies any diverting or illicit use.  Her primary pain complaint recently has been more spasming and low in the low back  Outpatient Medications Prior to Visit  Medication Sig Dispense Refill   Armodafinil 150 MG tablet Take 150 mg by mouth daily.     aspirin EC 81 MG tablet Take 81 mg by mouth daily.     COMBIVENT RESPIMAT 20-100 MCG/ACT AERS respimat SMARTSIG:2 Inhalation Via Inhaler 4 Times Daily PRN     cyclobenzaprine (FLEXERIL) 10 MG tablet Take 10 mg by mouth 2 (two) times daily.     Diclofenac-miSOPROStol 75-0.2 MG TBEC Take 1 tablet by mouth 2 (two) times daily. 180 tablet 1   DULoxetine (CYMBALTA) 60 MG capsule TAKE 1 CAPSULE BY MOUTH  DAILY 90 capsule 3   esomeprazole (NEXIUM) 20 MG capsule Take 40 mg by mouth daily. Over the Counter     fluocinonide (LIDEX) 0.05 % external solution Apply BID to affected areas on scalp PRN itching 60 mL 2   hydrocortisone 2.5 % cream Apply  topically 2 (two) times daily as needed (Rash). 30 g 2   ipratropium (ATROVENT) 0.06 % nasal spray Place into both nostrils.     ketoconazole (NIZORAL) 2 % shampoo 1-2 times a week lather on scalp/forehead, leave on 5-8 minutes, rinse well 120 mL 3   levothyroxine (SYNTHROID) 88 MCG tablet TAKE 1 TABLET BY MOUTH  DAILY 90 tablet 3   Loperamide HCl (IMODIUM PO) Take by mouth as needed.     losartan (COZAAR) 50 MG tablet Take 50 mg by mouth daily.     mirabegron ER (MYRBETRIQ) 50 MG TB24 tablet Take 1 tablet (50 mg total) by mouth daily. 30 tablet 11   Multiple Vitamin (MULTI-VITAMINS) TABS Take by mouth.     pantoprazole (PROTONIX) 40 MG tablet Take 1 tablet by mouth 2 (two) times daily.     pimecrolimus (ELIDEL) 1 % cream Apply BID to affected areas on face/eyelids 30 g 2   pravastatin (PRAVACHOL) 80 MG tablet Take 80 mg by mouth daily.     pregabalin (LYRICA) 50 MG capsule Take 50 mg by mouth 2 (two) times daily.     tolterodine (DETROL LA) 4 MG 24 hr capsule TAKE 1 CAPSULE(4 MG) BY MOUTH DAILY 90 capsule 3   Adapalene (DIFFERIN) 0.3 % gel Apply to back qhs, wash off qam (Patient not taking: Reported on 11/14/2020) 45 g 3  diazepam (VALIUM) 5 MG tablet Take 5 mg by mouth at bedtime.     HYDROcodone-acetaminophen (NORCO/VICODIN) 5-325 MG tablet Take 1 tablet by mouth 2 (two) times daily as needed for moderate pain. 45 tablet 0   loratadine (CLARITIN) 10 MG tablet Take 10 mg by mouth daily.     citalopram (CELEXA) 20 MG tablet TAKE 1 TABLET BY MOUTH  DAILY 90 tablet 3   traMADol (ULTRAM) 50 MG tablet Take by mouth 3 (three) times daily. (Patient not taking: No sig reported)     No facility-administered medications prior to visit.    Review of Systems CNS: No confusion or sedation Cardiac: No angina or palpitations GI: No abdominal pain or constipation Constitutional: No nausea vomiting fevers or chills  Objective:  BP 136/81 (BP Location: Left Arm, Patient Position: Sitting, Cuff Size:  Normal)   Pulse 86   Temp (!) 97 F (36.1 C) (Temporal)   Resp 16   Ht '5\' 8"'$  (1.727 m)   Wt 180 lb (81.6 kg)   SpO2 100%   BMI 27.37 kg/m    BP Readings from Last 3 Encounters:  11/14/20 134/72  11/11/20 136/81  10/03/20 126/71     Wt Readings from Last 3 Encounters:  11/14/20 179 lb (81.2 kg)  11/11/20 180 lb (81.6 kg)  10/03/20 180 lb (81.6 kg)     Physical Exam Pt is alert and oriented PERRL EOMI HEART IS RRR no murmur or rub LCTA no wheezing or rales MUSCULOSKELETAL reveals some paraspinous muscle tenderness but no overt trigger points.  She ambulates with a mildly antalgic gait.  Muscle tone and bulk are at baseline.  I would rate her strength of 5/5 both proximal and distal to the lower extremities.  Labs  Lab Results  Component Value Date   HGBA1C 6.1 (H) 09/05/2020   HGBA1C 6.5 (H) 03/28/2020   Lab Results  Component Value Date   LDLCALC 70 03/28/2020   CREATININE 0.94 09/05/2020    -------------------------------------------------------------------------------------------------------------------- Lab Results  Component Value Date   WBC 6.5 03/28/2020   HGB 13.2 03/28/2020   HCT 38.2 03/28/2020   PLT 290 03/28/2020   GLUCOSE 85 09/05/2020   CHOL 147 03/28/2020   TRIG 156 (H) 03/28/2020   HDL 50 03/28/2020   LDLCALC 70 03/28/2020   ALT 36 (H) 03/28/2020   AST 35 03/28/2020   NA 138 09/05/2020   K 4.8 09/05/2020   CL 98 09/05/2020   CREATININE 0.94 09/05/2020   BUN 27 09/05/2020   CO2 24 09/05/2020   TSH 2.160 03/28/2020   HGBA1C 6.1 (H) 09/05/2020    --------------------------------------------------------------------------------------------------------------------- No results found.   Assessment & Plan:   Carrie Myers was seen today for back pain, neck pain and knee pain.  Diagnoses and all orders for this visit:  Chronic bilateral low back pain with bilateral sciatica -     Ambulatory referral to Physical Therapy  DDD (degenerative  disc disease), lumbar -     Ambulatory referral to Physical Therapy  Chronic pain syndrome -     ToxASSURE Select 13 (MW), Urine  Fibromyalgia -     Ambulatory referral to Physical Therapy  Chronic, continuous use of opioids -     ToxASSURE Select 13 (MW), Urine  Chronic bilateral low back pain without sciatica  Facet syndrome, lumbar  Cervical facet joint syndrome  Generalized osteoarthritis  Cervicalgia  Other orders -     HYDROcodone-acetaminophen (NORCO/VICODIN) 5-325 MG tablet; Take 1 tablet by mouth 2 (two)  times daily as needed for moderate pain. -     Discontinue: HYDROcodone-acetaminophen (NORCO/VICODIN) 5-325 MG tablet; Take 1 tablet by mouth 2 (two) times daily. -     diazepam (VALIUM) 5 MG tablet; Take 1 tablet (5 mg total) by mouth at bedtime. -     HYDROcodone-acetaminophen (NORCO/VICODIN) 5-325 MG tablet; Take 1 tablet by mouth 2 (two) times daily.        ----------------------------------------------------------------------------------------------------------------------  Problem List Items Addressed This Visit       Unprioritized   Chronic pain syndrome (Chronic)   Relevant Medications   HYDROcodone-acetaminophen (NORCO/VICODIN) 5-325 MG tablet   HYDROcodone-acetaminophen (NORCO/VICODIN) 5-325 MG tablet (Start on 12/10/2020)   Other Relevant Orders   ToxASSURE Select 13 (MW), Urine   Fibromyalgia (Chronic)   Relevant Medications   HYDROcodone-acetaminophen (NORCO/VICODIN) 5-325 MG tablet   HYDROcodone-acetaminophen (NORCO/VICODIN) 5-325 MG tablet (Start on 12/10/2020)   Other Relevant Orders   Ambulatory referral to Physical Therapy   Facet syndrome, lumbar   Relevant Medications   HYDROcodone-acetaminophen (NORCO/VICODIN) 5-325 MG tablet   HYDROcodone-acetaminophen (NORCO/VICODIN) 5-325 MG tablet (Start on 12/10/2020)   Generalized osteoarthritis   Relevant Medications   HYDROcodone-acetaminophen (NORCO/VICODIN) 5-325 MG tablet    HYDROcodone-acetaminophen (NORCO/VICODIN) 5-325 MG tablet (Start on 12/10/2020)   Other Visit Diagnoses     Chronic bilateral low back pain with bilateral sciatica    -  Primary   Relevant Medications   HYDROcodone-acetaminophen (NORCO/VICODIN) 5-325 MG tablet   diazepam (VALIUM) 5 MG tablet   HYDROcodone-acetaminophen (NORCO/VICODIN) 5-325 MG tablet (Start on 12/10/2020)   Other Relevant Orders   Ambulatory referral to Physical Therapy   DDD (degenerative disc disease), lumbar       Relevant Medications   HYDROcodone-acetaminophen (NORCO/VICODIN) 5-325 MG tablet   HYDROcodone-acetaminophen (NORCO/VICODIN) 5-325 MG tablet (Start on 12/10/2020)   Other Relevant Orders   Ambulatory referral to Physical Therapy   Chronic, continuous use of opioids       Relevant Orders   ToxASSURE Select 13 (MW), Urine   Chronic bilateral low back pain without sciatica       Relevant Medications   HYDROcodone-acetaminophen (NORCO/VICODIN) 5-325 MG tablet   HYDROcodone-acetaminophen (NORCO/VICODIN) 5-325 MG tablet (Start on 12/10/2020)   Cervical facet joint syndrome       Cervicalgia             ----------------------------------------------------------------------------------------------------------------------  1. Chronic bilateral low back pain with bilateral sciatica Prior to any interventional therapy I think it would be beneficial for her to get back over to physical therapy for evaluation and possible treatment.  She may be a candidate for TENS application we talked about this and some stretching exercises that could be beneficial for her low back pain and its management. - Ambulatory referral to Physical Therapy  2. DDD (degenerative disc disease), lumbar As above - Ambulatory referral to Physical Therapy  3. Chronic pain syndrome I have reviewed the [facility] practitioner database information and it is appropriate for refill today.  She continues to get good relief from the once a  day hydrocodone where she has failed more conservative therapy.  I do not feel interventional therapy is indicated at this time.  In the meantime she can continue with the Vicodin 1 tablet at bedtime which seems to be working well for both the insomnia and muscle spasming.  She denies any diverting or illicit use.   - ToxASSURE Select 13 (MW), Urine  4. Fibromyalgia As above - Ambulatory referral to Physical Therapy  5.  Chronic, continuous use of opioids  - ToxASSURE Select 13 (MW), Urine  6. Chronic bilateral low back pain without sciatica   7. Facet syndrome, lumbar   8. Cervical facet joint syndrome   9. Generalized osteoarthritis   10. Cervicalgia     ----------------------------------------------------------------------------------------------------------------------  I have changed Carrie Myers. Carrie Myers's diazepam. I am also having her maintain her esomeprazole, losartan, pravastatin, aspirin EC, Multi-Vitamins, Loperamide HCl (IMODIUM PO), citalopram, tolterodine, levothyroxine, Combivent Respimat, Diclofenac-miSOPROStol, mirabegron ER, pantoprazole, Armodafinil, ipratropium, DULoxetine, cyclobenzaprine, pregabalin, pimecrolimus, ketoconazole, fluocinonide, hydrocortisone, HYDROcodone-acetaminophen, and HYDROcodone-acetaminophen.   Meds ordered this encounter  Medications   HYDROcodone-acetaminophen (NORCO/VICODIN) 5-325 MG tablet    Sig: Take 1 tablet by mouth 2 (two) times daily as needed for moderate pain.    Dispense:  45 tablet    Refill:  0    To be filled once per month..30 day supply   DISCONTD: HYDROcodone-acetaminophen (NORCO/VICODIN) 5-325 MG tablet    Sig: Take 1 tablet by mouth 2 (two) times daily.    Dispense:  45 tablet    Refill:  0   diazepam (VALIUM) 5 MG tablet    Sig: Take 1 tablet (5 mg total) by mouth at bedtime.    Dispense:  30 tablet    Refill:  3   HYDROcodone-acetaminophen (NORCO/VICODIN) 5-325 MG tablet    Sig: Take 1 tablet by mouth 2  (two) times daily.    Dispense:  45 tablet    Refill:  0   Patient's Medications  New Prescriptions   HYDROCODONE-ACETAMINOPHEN (NORCO/VICODIN) 5-325 MG TABLET    Take 1 tablet by mouth 2 (two) times daily.  Previous Medications   ARMODAFINIL 150 MG TABLET    Take 150 mg by mouth daily.   ASPIRIN EC 81 MG TABLET    Take 81 mg by mouth daily.   CITALOPRAM (CELEXA) 20 MG TABLET    TAKE 1 TABLET BY MOUTH  DAILY   COMBIVENT RESPIMAT 20-100 MCG/ACT AERS RESPIMAT    SMARTSIG:2 Inhalation Via Inhaler 4 Times Daily PRN   CYCLOBENZAPRINE (FLEXERIL) 10 MG TABLET    Take 10 mg by mouth 2 (two) times daily.   DICLOFENAC-MISOPROSTOL 75-0.2 MG TBEC    Take 1 tablet by mouth 2 (two) times daily.   DULOXETINE (CYMBALTA) 60 MG CAPSULE    TAKE 1 CAPSULE BY MOUTH  DAILY   ESOMEPRAZOLE (NEXIUM) 20 MG CAPSULE    Take 40 mg by mouth daily. Over the Counter   FLUOCINONIDE (LIDEX) 0.05 % EXTERNAL SOLUTION    Apply BID to affected areas on scalp PRN itching   HYDROCORTISONE 2.5 % CREAM    Apply topically 2 (two) times daily as needed (Rash).   IPRATROPIUM (ATROVENT) 0.06 % NASAL SPRAY    Place into both nostrils.   KETOCONAZOLE (NIZORAL) 2 % SHAMPOO    1-2 times a week lather on scalp/forehead, leave on 5-8 minutes, rinse well   LEVOCETIRIZINE (XYZAL) 5 MG TABLET    Take by mouth.   LEVOTHYROXINE (SYNTHROID) 88 MCG TABLET    TAKE 1 TABLET BY MOUTH  DAILY   LOPERAMIDE HCL (IMODIUM PO)    Take by mouth as needed.   LOSARTAN (COZAAR) 50 MG TABLET    Take 50 mg by mouth daily.   MIRABEGRON ER (MYRBETRIQ) 50 MG TB24 TABLET    Take 1 tablet (50 mg total) by mouth daily.   MULTIPLE VITAMIN (MULTI-VITAMINS) TABS    Take by mouth.   PANTOPRAZOLE (PROTONIX) 40 MG TABLET  Take 1 tablet by mouth 2 (two) times daily.   PIMECROLIMUS (ELIDEL) 1 % CREAM    Apply BID to affected areas on face/eyelids   PRAVASTATIN (PRAVACHOL) 80 MG TABLET    Take 80 mg by mouth daily.   PREGABALIN (LYRICA) 50 MG CAPSULE    Take 50 mg by mouth  2 (two) times daily.   TOLTERODINE (DETROL LA) 4 MG 24 HR CAPSULE    TAKE 1 CAPSULE(4 MG) BY MOUTH DAILY  Modified Medications   Modified Medication Previous Medication   DIAZEPAM (VALIUM) 5 MG TABLET diazepam (VALIUM) 5 MG tablet      Take 1 tablet (5 mg total) by mouth at bedtime.    Take 5 mg by mouth at bedtime.   HYDROCODONE-ACETAMINOPHEN (NORCO/VICODIN) 5-325 MG TABLET HYDROcodone-acetaminophen (NORCO/VICODIN) 5-325 MG tablet      Take 1 tablet by mouth 2 (two) times daily as needed for moderate pain.    Take 1 tablet by mouth 2 (two) times daily as needed for moderate pain.  Discontinued Medications   ADAPALENE (DIFFERIN) 0.3 % GEL    Apply to back qhs, wash off qam   LORATADINE (CLARITIN) 10 MG TABLET    Take 10 mg by mouth daily.   TRAMADOL (ULTRAM) 50 MG TABLET    Take by mouth 3 (three) times daily.   ----------------------------------------------------------------------------------------------------------------------  Follow-up: Return in about 2 months (around 01/11/2021) for procedure, evaluation.    Molli Barrows, MD

## 2020-11-18 ENCOUNTER — Telehealth: Payer: Self-pay | Admitting: Internal Medicine

## 2020-11-18 LAB — TOXASSURE SELECT 13 (MW), URINE

## 2020-11-18 NOTE — Telephone Encounter (Signed)
Pt is calling for x ray results of her knee. Please advise

## 2020-11-18 NOTE — Progress Notes (Signed)
Called pt left VM to call back. ? ?[facility] nurse may give results to patient if they return call to clinic, a CRM has been created. ? ?KP

## 2020-11-18 NOTE — Telephone Encounter (Signed)
Document in result notes.

## 2020-11-24 ENCOUNTER — Other Ambulatory Visit: Payer: Self-pay

## 2020-11-24 ENCOUNTER — Encounter: Payer: Self-pay | Admitting: Family Medicine

## 2020-11-24 ENCOUNTER — Ambulatory Visit (INDEPENDENT_AMBULATORY_CARE_PROVIDER_SITE_OTHER): Payer: Medicare Other | Admitting: Family Medicine

## 2020-11-24 VITALS — BP 134/76 | HR 87 | Temp 98.4°F | Ht 68.0 in | Wt 176.0 lb

## 2020-11-24 DIAGNOSIS — M1711 Unilateral primary osteoarthritis, right knee: Secondary | ICD-10-CM | POA: Insufficient documentation

## 2020-11-24 MED ORDER — MELOXICAM 7.5 MG PO TABS
7.5000 mg | ORAL_TABLET | Freq: Every day | ORAL | 0 refills | Status: DC
Start: 2020-11-24 — End: 2020-12-08

## 2020-11-24 NOTE — Patient Instructions (Addendum)
-   Start meloxicam once daily, take with food - Use knee brace while on your feet, okay to move forward periods of prolonged rest, bathing, sleeping - Perform gentle activity as tolerated using knee symptoms as a guide - Return for follow-up in 2 weeks, contact us for questions between now and then

## 2020-11-24 NOTE — Assessment & Plan Note (Signed)
Right knee symptoms ongoing for months with recent ultrasound evidence of popliteal cyst in 09/2020 and significant worsening following a fall to her left side a few weeks prior. Pain is diffuse about the knee, associated with swelling. She obtained x-rays recently and has been on several medications for pain control through Dr. Andree Elk with pain management.  Examination shows 1+ effusion, ROM from 0-120, limited by pain, tenderness maximally at the medial joint line, secondarily to the lateral patellar facet, and lateral joint line. No laxity with A/P drawer or valgus/varus stressing, McMurray equivocal medially. No popliteal cyst palpable in the fossa.  Given her clinical history, radiographs, and examination findings, her symptoms are most consistent with a flare of her underlying tricompartmental right knee osteoarthritis. Given treatments that she has been on we will increase NSAID dose with 14 days of meloxicam 7.5 mg, a hinged knee brace was provided, and we will seek authorization for viscosupplementation. Low threshold for cortisone injection +/- aspiration at follow-up pending symptoms at that return. If improved / improving, plan for home based or formal physical therapy.

## 2020-11-24 NOTE — Progress Notes (Signed)
Primary Care / Sports Medicine Office Visit  Patient Information:  Patient ID: Carrie Myers, female DOB: September 16, 1945 Age: 75 y.o. MRN: [id number]   Carrie Myers is a pleasant 75 y.o. female presenting with the following:  Chief Complaint  Patient presents with   New Patient (Initial Visit)   Knee Pain    Right; X-Ray 11/14/20; x3 weeks after a fall; sometimes when walking "knee grabs" and there is a stabbing pain that feels like it's cracking; history of Baker's cyst of the right knee 09/27/20; 3/10 pain    Review of Systems pertinent details above   Patient Active Problem List   Diagnosis Date Noted   Primary osteoarthritis of right knee 11/24/2020   Overactive bladder 06/18/2020   Prediabetes 04/14/2020   Slow transit constipation 06/05/2019   Sciatica, left side 01/01/2019   Diverticulosis of colon 04/25/2018   Change in bowel habits    Tobacco use disorder, moderate, in sustained remission 12/22/2017   Moderate episode of recurrent major depressive disorder ([place]) 10/03/2017   Inflammatory spondylopathy of lumbar region ([place]) 10/03/2017   Hoarseness, persistent 04/18/2017   OSA on CPAP 02/18/2017   Cervical disc disease 12/31/2016   Generalized osteoarthritis 12/23/2016   Chronic pain syndrome 12/23/2016   Cystocele, midline 12/06/2016   Urinary incontinence 12/06/2016   Impingement syndrome of right shoulder 04/17/2015   Fibrocystic breast 01/18/2015   Gastroesophageal reflux disease without esophagitis 01/18/2015   Hypothyroidism, postablative 01/18/2015   Arteriosclerosis of coronary artery 12/29/2014   DJD of shoulder 11/13/2014   Fibromyalgia 10/02/2014   Intercostal neuralgia 10/02/2014   Facet syndrome, lumbar 10/02/2014   Sacroiliac joint disease 10/02/2014   Carotid artery narrowing 07/09/2014   Benign essential HTN 07/01/2014   Osteoarthritis of thumb 09/24/2012   Mixed hyperlipidemia 09/22/2012   Arthritis of hand, degenerative 09/22/2012    Past Medical History:  Diagnosis Date   Allergy    Anxiety    Arthritis    neck, hands, lower back   Bilateral dry eyes    Chronic low back pain 12/23/2016   Coronary artery disease    DDD (degenerative disc disease), lumbar 10/02/2014   Dental crowns present    Implants, front "flipper" while waiting for other implants   Emphysema of lung ([place])    Fibromyalgia    Fibromyalgia    Fracture of right foot 01/14/2015   GERD (gastroesophageal reflux disease)    History of Graves' disease    Hyperlipidemia    Hypertension    Hypothyroidism    Myocardial infarction ([place]) 04/22/2006   Osteoarthritis    Other shoulder lesions, right shoulder 04/17/2015   Rheumatoid arthritis ([place])    Sciatica, left side 01/01/2019   Sleep apnea    CPAP   Status post shoulder surgery 05/27/2015   Thyroid disease    Vertigo    none for approx 9 yrs   Outpatient Encounter Medications as of 11/24/2020  Medication Sig   Armodafinil 150 MG tablet Take 150 mg by mouth daily.   aspirin EC 81 MG tablet Take 81 mg by mouth daily.   citalopram (CELEXA) 20 MG tablet TAKE 1 TABLET BY MOUTH  DAILY   COMBIVENT RESPIMAT 20-100 MCG/ACT AERS respimat SMARTSIG:2 Inhalation Via Inhaler 4 Times Daily PRN   cyclobenzaprine (FLEXERIL) 10 MG tablet Take 10 mg by mouth 2 (two) times daily.   diazepam (VALIUM) 5 MG tablet Take 1 tablet (5 mg total) by mouth at bedtime.   Diclofenac-miSOPROStol 75-0.2 MG  TBEC Take 1 tablet by mouth 2 (two) times daily.   DULoxetine (CYMBALTA) 60 MG capsule TAKE 1 CAPSULE BY MOUTH  DAILY   esomeprazole (NEXIUM) 20 MG capsule Take 20 mg by mouth 2 (two) times daily before a meal.   fluocinonide (LIDEX) 0.05 % external solution Apply BID to affected areas on scalp PRN itching   HYDROcodone-acetaminophen (NORCO/VICODIN) 5-325 MG tablet Take 1 tablet by mouth 2 (two) times daily as needed for moderate pain.   [START ON 12/10/2020] HYDROcodone-acetaminophen (NORCO/VICODIN) 5-325 MG tablet Take  1 tablet by mouth 2 (two) times daily.   hydrocortisone 2.5 % cream Apply topically 2 (two) times daily as needed (Rash).   ipratropium (ATROVENT) 0.06 % nasal spray Place into both nostrils.   ketoconazole (NIZORAL) 2 % shampoo 1-2 times a week lather on scalp/forehead, leave on 5-8 minutes, rinse well   levocetirizine (XYZAL) 5 MG tablet Take by mouth.   levothyroxine (SYNTHROID) 88 MCG tablet TAKE 1 TABLET BY MOUTH  DAILY   Loperamide HCl (IMODIUM PO) Take 1 tablet by mouth as needed.   losartan (COZAAR) 50 MG tablet Take 50 mg by mouth daily.   meloxicam (MOBIC) 7.5 MG tablet Take 1 tablet (7.5 mg total) by mouth daily.   mirabegron ER (MYRBETRIQ) 50 MG TB24 tablet Take 1 tablet (50 mg total) by mouth daily.   Multiple Vitamin (MULTI-VITAMINS) TABS Take by mouth.   pantoprazole (PROTONIX) 40 MG tablet Take 1 tablet by mouth 2 (two) times daily.   pimecrolimus (ELIDEL) 1 % cream Apply BID to affected areas on face/eyelids   pravastatin (PRAVACHOL) 80 MG tablet Take 80 mg by mouth daily.   pregabalin (LYRICA) 50 MG capsule Take 50 mg by mouth 2 (two) times daily.   tolterodine (DETROL LA) 4 MG 24 hr capsule TAKE 1 CAPSULE(4 MG) BY MOUTH DAILY   No facility-administered encounter medications on file as of 11/24/2020.   Past Surgical History:  Procedure Laterality Date   ABDOMINAL HYSTERECTOMY     total   APPENDECTOMY     BREAST BIOPSY Right    neg   BREAST CYST ASPIRATION Left    neg   BUNIONECTOMY Bilateral    CESAREAN SECTION     COLONOSCOPY  2010   normal   COLONOSCOPY WITH PROPOFOL N/A 04/03/2018   Procedure: COLONOSCOPY WITH BIOPSIES;  Surgeon: Lucilla Lame, MD;  Location: [place];  Service: Endoscopy;  Laterality: N/A;  sleep apnea   CORONARY ANGIOPLASTY WITH STENT PLACEMENT  04/2006   dental implant     [age]   DILATION AND CURETTAGE OF UTERUS     epidural steroid injection  2018   ESOPHAGOGASTRODUODENOSCOPY N/A 10/03/2020   Procedure:  ESOPHAGOGASTRODUODENOSCOPY (EGD);  Surgeon: Lesly Rubenstein, MD;  Location: [facility] ENDOSCOPY;  Service: Endoscopy;  Laterality: N/A;  REQUESTING MORNING AFTER 9 AM   EYE SURGERY     bilateral cataract extraction   SHOULDER ARTHROSCOPY Right 05/07/2015   Procedure: Extensive arthroscopic debridement and arthroscopic subacromial decompression, right shoulder.                        2.  Steroid injection right thumb CMC joint.;  Surgeon: Corky Mull, MD;  Location: [place];  Service: Orthopedics;  Laterality: Right;   TOOTH EXTRACTION     Upper centrals and lateral    Vitals:   11/24/20 1453  BP: 134/76  Pulse: 87  Temp: 98.4 F (36.9 C)  SpO2: 96%  Vitals:   11/24/20 1453  Weight: 176 lb (79.8 kg)  Height: '5\' 8"'$  (1.727 m)   Body mass index is 26.76 kg/m.  DG Knee Complete 4 Views Right  Result Date: 11/17/2020 CLINICAL DATA:  right knee pain, suspect ruptured Baker's cyst EXAM: RIGHT KNEE - COMPLETE 4+ VIEW COMPARISON:  None. FINDINGS: There is no evidence of acute fracture. There is tricompartment degenerative change of the right knee with mild joint space narrowing. There is a small joint effusion. Vascular calcifications. IMPRESSION: Mild tricompartment osteoarthritis.  Small joint effusion. Electronically Signed   By: Maurine Simmering M.D.   On: 11/17/2020 13:18     Independent interpretation of notes and tests performed by another provider:   Independent interpretation of right knee x-ray dated 11/17/2020 reveals tricompartmental osteoarthritis with maximal involvement at the medial joint line, this is best appreciated on Lutricia Feil view, secondarily to the lateral aspect of the patellofemoral joint where there is osteophyte presence, cortical roughening at the tibial spines, vascular calcifications incidentally noted, trace joint effusion, no acute osseous process identified  Procedures performed:   None  Pertinent History, Exam, Impression, and Recommendations:    Primary osteoarthritis of right knee Right knee symptoms ongoing for months with recent ultrasound evidence of popliteal cyst in 09/2020 and significant worsening following a fall to her left side a few weeks prior. Pain is diffuse about the knee, associated with swelling. She obtained x-rays recently and has been on several medications for pain control through Dr. Andree Elk with pain management.  Examination shows 1+ effusion, ROM from 0-120, limited by pain, tenderness maximally at the medial joint line, secondarily to the lateral patellar facet, and lateral joint line. No laxity with A/P drawer or valgus/varus stressing, McMurray equivocal medially. No popliteal cyst palpable in the fossa.  Given her clinical history, radiographs, and examination findings, her symptoms are most consistent with a flare of her underlying tricompartmental right knee osteoarthritis. Given treatments that she has been on we will increase NSAID dose with 14 days of meloxicam 7.5 mg, a hinged knee brace was provided, and we will seek authorization for viscosupplementation. Low threshold for cortisone injection +/- aspiration at follow-up pending symptoms at that return. If improved / improving, plan for home based or formal physical therapy.   Orders & Medications Meds ordered this encounter  Medications   meloxicam (MOBIC) 7.5 MG tablet    Sig: Take 1 tablet (7.5 mg total) by mouth daily.    Dispense:  30 tablet    Refill:  0   No orders of the defined types were placed in this encounter.    Return in about 2 weeks (around 12/08/2020) for right knee follow-up.     Montel Culver, MD   Primary Care Sports Medicine [place]

## 2020-11-26 ENCOUNTER — Telehealth: Payer: Self-pay

## 2020-11-26 NOTE — Telephone Encounter (Signed)
New case for viscosupplementation submitted through the [employer] portal.  Pending response.  For your information.

## 2020-11-26 NOTE — Telephone Encounter (Signed)
-----   Message from Montel Culver, MD sent at 11/25/2020  4:48 PM EDT ----- Regarding: Viscosupplementation Patient needs right knee visco authorization. Thanks

## 2020-11-29 DIAGNOSIS — H18593 Other hereditary corneal dystrophies, bilateral: Secondary | ICD-10-CM | POA: Diagnosis not present

## 2020-11-29 DIAGNOSIS — Z961 Presence of intraocular lens: Secondary | ICD-10-CM | POA: Diagnosis not present

## 2020-11-29 DIAGNOSIS — H43812 Vitreous degeneration, left eye: Secondary | ICD-10-CM | POA: Diagnosis not present

## 2020-11-29 DIAGNOSIS — H16141 Punctate keratitis, right eye: Secondary | ICD-10-CM | POA: Diagnosis not present

## 2020-12-04 DIAGNOSIS — J438 Other emphysema: Secondary | ICD-10-CM | POA: Diagnosis not present

## 2020-12-08 ENCOUNTER — Encounter: Payer: Self-pay | Admitting: Family Medicine

## 2020-12-08 ENCOUNTER — Other Ambulatory Visit: Payer: Self-pay

## 2020-12-08 ENCOUNTER — Ambulatory Visit (INDEPENDENT_AMBULATORY_CARE_PROVIDER_SITE_OTHER): Payer: Medicare Other | Admitting: Family Medicine

## 2020-12-08 VITALS — BP 118/82 | HR 82 | Ht 68.0 in | Wt 180.0 lb

## 2020-12-08 DIAGNOSIS — I2510 Atherosclerotic heart disease of native coronary artery without angina pectoris: Secondary | ICD-10-CM

## 2020-12-08 DIAGNOSIS — M1711 Unilateral primary osteoarthritis, right knee: Secondary | ICD-10-CM

## 2020-12-08 DIAGNOSIS — Z20822 Contact with and (suspected) exposure to covid-19: Secondary | ICD-10-CM | POA: Diagnosis not present

## 2020-12-08 MED ORDER — MELOXICAM 7.5 MG PO TABS: 7.5000 mg | ORAL_TABLET | Freq: Every day | ORAL | 0 refills | Status: DC | PRN

## 2020-12-08 NOTE — Assessment & Plan Note (Signed)
Patient has demonstrated interval improvement at her right knee citing low pain scores.  She has been compliant with the brace and meloxicam usage, she does not wear the brace throughout the day.  Examination shows pain primarily with maximal flexion localizing to the patellofemoral articulation and medial greater than lateral joint lines.  Given her interval improvement, I did discuss additional strategies to optimize her nonsurgical care.  She is amenable to starting formal physical therapy where they will oversee a primarily home-based program.  Additionally, she is to transition to as needed dosing of meloxicam.  We did obtain authorization and will order viscosupplementation, coordinate Zilretta, and contact patient to schedule follow-up for we can administer these medications as indicated.

## 2020-12-08 NOTE — Progress Notes (Signed)
Primary Care / Sports Medicine Office Visit  Patient Information:  Patient ID: Carrie Myers, female DOB: [birth year] Age: 75 y.o. MRN: [id number]   Carrie Myers is a pleasant 75 y.o. female presenting with the following:  Chief Complaint  Patient presents with   Primary osteoarthritis of right knee    Taking meloxicam and using hinged-knee brace with relief; 3/10 pain    Review of Systems pertinent details above   Patient Active Problem List   Diagnosis Date Noted   Primary osteoarthritis of right knee 11/24/2020   Overactive bladder 06/18/2020   Prediabetes 04/14/2020   Slow transit constipation 06/05/2019   Sciatica, left side 01/01/2019   Diverticulosis of colon 04/25/2018   Change in bowel habits    Tobacco use disorder, moderate, in sustained remission 12/22/2017   Moderate episode of recurrent major depressive disorder ([place]) 10/03/2017   Inflammatory spondylopathy of lumbar region ([place]) 10/03/2017   Hoarseness, persistent 04/18/2017   OSA on CPAP 02/18/2017   Cervical disc disease 12/31/2016   Generalized osteoarthritis 12/23/2016   Chronic pain syndrome 12/23/2016   Cystocele, midline 12/06/2016   Urinary incontinence 12/06/2016   Impingement syndrome of right shoulder 04/17/2015   Fibrocystic breast 01/18/2015   Gastroesophageal reflux disease without esophagitis 01/18/2015   Hypothyroidism, postablative 01/18/2015   Arteriosclerosis of coronary artery 12/29/2014   DJD of shoulder 11/13/2014   Fibromyalgia 10/02/2014   Intercostal neuralgia 10/02/2014   Facet syndrome, lumbar 10/02/2014   Sacroiliac joint disease 10/02/2014   Carotid artery narrowing 07/09/2014   Benign essential HTN 07/01/2014   Osteoarthritis of thumb 09/24/2012   Mixed hyperlipidemia 09/22/2012   Arthritis of hand, degenerative 09/22/2012   Past Medical History:  Diagnosis Date   Allergy    Anxiety    Arthritis    neck, hands, lower back   Bilateral dry eyes     Chronic low back pain 12/23/2016   Coronary artery disease    DDD (degenerative disc disease), lumbar 10/02/2014   Dental crowns present    Implants, front "flipper" while waiting for other implants   Emphysema of lung ([place])    Fibromyalgia    Fibromyalgia    Fracture of right foot 01/14/2015   GERD (gastroesophageal reflux disease)    History of Graves' disease    Hyperlipidemia    Hypertension    Hypothyroidism    Myocardial infarction ([place]) 04/22/2006   Osteoarthritis    Other shoulder lesions, right shoulder 04/17/2015   Rheumatoid arthritis ([place])    Sciatica, left side 01/01/2019   Sleep apnea    CPAP   Status post shoulder surgery 05/27/2015   Thyroid disease    Vertigo    none for approx 9 yrs   Outpatient Encounter Medications as of 12/08/2020  Medication Sig   Armodafinil 150 MG tablet Take 150 mg by mouth daily.   aspirin EC 81 MG tablet Take 81 mg by mouth daily.   citalopram (CELEXA) 20 MG tablet TAKE 1 TABLET BY MOUTH  DAILY   COMBIVENT RESPIMAT 20-100 MCG/ACT AERS respimat SMARTSIG:2 Inhalation Via Inhaler 4 Times Daily PRN   cyclobenzaprine (FLEXERIL) 10 MG tablet Take 10 mg by mouth 2 (two) times daily.   diazepam (VALIUM) 5 MG tablet Take 1 tablet (5 mg total) by mouth at bedtime.   Diclofenac-miSOPROStol 75-0.2 MG TBEC Take 1 tablet by mouth 2 (two) times daily.   DULoxetine (CYMBALTA) 60 MG capsule TAKE 1 CAPSULE BY MOUTH  DAILY   esomeprazole (NEXIUM)  20 MG capsule Take 20 mg by mouth 2 (two) times daily before a meal.   fluocinonide (LIDEX) 0.05 % external solution Apply BID to affected areas on scalp PRN itching   HYDROcodone-acetaminophen (NORCO/VICODIN) 5-325 MG tablet Take 1 tablet by mouth 2 (two) times daily as needed for moderate pain.   [START ON 12/10/2020] HYDROcodone-acetaminophen (NORCO/VICODIN) 5-325 MG tablet Take 1 tablet by mouth 2 (two) times daily.   hydrocortisone 2.5 % cream Apply topically 2 (two) times daily as needed (Rash).    ipratropium (ATROVENT) 0.06 % nasal spray Place into both nostrils.   ketoconazole (NIZORAL) 2 % shampoo 1-2 times a week lather on scalp/forehead, leave on 5-8 minutes, rinse well   levocetirizine (XYZAL) 5 MG tablet Take by mouth.   levothyroxine (SYNTHROID) 88 MCG tablet TAKE 1 TABLET BY MOUTH  DAILY   Loperamide HCl (IMODIUM PO) Take 1 tablet by mouth as needed.   losartan (COZAAR) 50 MG tablet Take 50 mg by mouth daily.   mirabegron ER (MYRBETRIQ) 50 MG TB24 tablet Take 1 tablet (50 mg total) by mouth daily.   Multiple Vitamin (MULTI-VITAMINS) TABS Take by mouth.   pantoprazole (PROTONIX) 40 MG tablet Take 1 tablet by mouth 2 (two) times daily.   pimecrolimus (ELIDEL) 1 % cream Apply BID to affected areas on face/eyelids   pravastatin (PRAVACHOL) 80 MG tablet Take 80 mg by mouth daily.   pregabalin (LYRICA) 50 MG capsule Take 50 mg by mouth 2 (two) times daily.   tolterodine (DETROL LA) 4 MG 24 hr capsule TAKE 1 CAPSULE(4 MG) BY MOUTH DAILY   [DISCONTINUED] meloxicam (MOBIC) 7.5 MG tablet Take 1 tablet (7.5 mg total) by mouth daily.   meloxicam (MOBIC) 7.5 MG tablet Take 1 tablet (7.5 mg total) by mouth daily as needed for pain.   No facility-administered encounter medications on file as of 12/08/2020.   Past Surgical History:  Procedure Laterality Date   ABDOMINAL HYSTERECTOMY     total   APPENDECTOMY     BREAST BIOPSY Right    neg   BREAST CYST ASPIRATION Left    neg   BUNIONECTOMY Bilateral    CESAREAN SECTION     COLONOSCOPY  2010   normal   COLONOSCOPY WITH PROPOFOL N/A 04/03/2018   Procedure: COLONOSCOPY WITH BIOPSIES;  Surgeon: Lucilla Lame, MD;  Location: [place];  Service: Endoscopy;  Laterality: N/A;  sleep apnea   CORONARY ANGIOPLASTY WITH STENT PLACEMENT  04/2006   dental implant     [age]   DILATION AND CURETTAGE OF UTERUS     epidural steroid injection  2018   ESOPHAGOGASTRODUODENOSCOPY N/A 10/03/2020   Procedure: ESOPHAGOGASTRODUODENOSCOPY (EGD);   Surgeon: Lesly Rubenstein, MD;  Location: [facility] ENDOSCOPY;  Service: Endoscopy;  Laterality: N/A;  REQUESTING MORNING AFTER 9 AM   EYE SURGERY     bilateral cataract extraction   SHOULDER ARTHROSCOPY Right 05/07/2015   Procedure: Extensive arthroscopic debridement and arthroscopic subacromial decompression, right shoulder.                        2.  Steroid injection right thumb CMC joint.;  Surgeon: Corky Mull, MD;  Location: [place];  Service: Orthopedics;  Laterality: Right;   TOOTH EXTRACTION     Upper centrals and lateral    Vitals:   12/08/20 1500  BP: 118/82  Pulse: 82  SpO2: 97%   Vitals:   12/08/20 1500  Weight: 180 lb (81.6 kg)  Height: 5\' 8"  (1.727 m)   Body mass index is 27.37 kg/m.  DG Knee Complete 4 Views Right  Result Date: 11/17/2020 CLINICAL DATA:  right knee pain, suspect ruptured Baker's cyst EXAM: RIGHT KNEE - COMPLETE 4+ VIEW COMPARISON:  None. FINDINGS: There is no evidence of acute fracture. There is tricompartment degenerative change of the right knee with mild joint space narrowing. There is a small joint effusion. Vascular calcifications. IMPRESSION: Mild tricompartment osteoarthritis.  Small joint effusion. Electronically Signed   By: Maurine Simmering M.D.   On: 11/17/2020 13:18     Independent interpretation of notes and tests performed by another provider:   None  Procedures performed:   None  Pertinent History, Exam, Impression, and Recommendations:   Primary osteoarthritis of right knee Patient has demonstrated interval improvement at her right knee citing low pain scores.  She has been compliant with the brace and meloxicam usage, she does not wear the brace throughout the day.  Examination shows pain primarily with maximal flexion localizing to the patellofemoral articulation and medial greater than lateral joint lines.  Given her interval improvement, I did discuss additional strategies to optimize her nonsurgical care.  She is  amenable to starting formal physical therapy where they will oversee a primarily home-based program.  Additionally, she is to transition to as needed dosing of meloxicam.  We did obtain authorization and will order viscosupplementation, coordinate Zilretta, and contact patient to schedule follow-up for we can administer these medications as indicated.   Orders & Medications Meds ordered this encounter  Medications   meloxicam (MOBIC) 7.5 MG tablet    Sig: Take 1 tablet (7.5 mg total) by mouth daily as needed for pain.    Dispense:  30 tablet    Refill:  0   Orders Placed This Encounter  Procedures   Ambulatory referral to Physical Therapy     Return if symptoms worsen or fail to improve.     Montel Culver, MD   Primary Care Sports Medicine [place]

## 2020-12-08 NOTE — Patient Instructions (Signed)
-   Referral coordinator will contact you regarding physical therapy scheduling, they will oversee a primarily home-based program - Can continue meloxicam on an as-needed basis (take with food) - Continue knee brace sparingly for anticipated prolonged activity and/or strenuous activity - Our office will contact you for follow-up to receive Zilretta/Monovisc - Contact us for questions between now and

## 2020-12-10 ENCOUNTER — Ambulatory Visit: Payer: Medicare Other | Admitting: Internal Medicine

## 2020-12-12 NOTE — Telephone Encounter (Signed)
Patient approved for Buy and Bill through [employer] portal.  Monovisc ordered and received today.  Patient scheduled Monday, 12/15/20, for injection.  For your information.

## 2020-12-15 ENCOUNTER — Inpatient Hospital Stay: Payer: Self-pay | Admitting: Radiology

## 2020-12-15 ENCOUNTER — Other Ambulatory Visit: Payer: Self-pay

## 2020-12-15 ENCOUNTER — Ambulatory Visit (INDEPENDENT_AMBULATORY_CARE_PROVIDER_SITE_OTHER): Payer: Medicare Other | Admitting: Family Medicine

## 2020-12-15 ENCOUNTER — Encounter: Payer: Self-pay | Admitting: Family Medicine

## 2020-12-15 VITALS — BP 138/82 | HR 89 | Temp 98.1°F | Ht 68.0 in | Wt 180.0 lb

## 2020-12-15 DIAGNOSIS — M25461 Effusion, right knee: Secondary | ICD-10-CM

## 2020-12-15 DIAGNOSIS — M1711 Unilateral primary osteoarthritis, right knee: Secondary | ICD-10-CM

## 2020-12-15 NOTE — Assessment & Plan Note (Signed)
Patient tolerated ultrasound-guided aspiration of inflammatory fluid followed by injection of Monovisc to the right knee, post care reviewed.  We can repeat this in 6 months time as indicated.  I did advise the patient to contact her office in 4 weeks if the right knee is still symptomatic, at that time Zilretta versus standard corticosteroid can be injected as an adjunct.

## 2020-12-15 NOTE — Patient Instructions (Signed)
You have just been given a gel lubricant injection to reduce knee pain. You can resume normal activities but avoid anything strenuous that puts excess strain on the joint for 48 hours. Avoid activities such as jogging, soccer, tennis, heavy lifting, or standing on your feet for a long time. If you do have pain, simply rest the joint and use ice. If you can tolerate over the counter medications, you can try Tylenol, Aleve, or Advil for added relief per package instructions. - The gel can be repeated in 6 months, contact our office to schedule or for any questions 

## 2020-12-15 NOTE — Assessment & Plan Note (Signed)
See additional assessment(s) for plan details. 

## 2020-12-15 NOTE — Progress Notes (Signed)
Primary Care / Sports Medicine Office Visit  Patient Information:  Patient ID: Carrie Myers, female DOB: [birth year] Age: 75 y.o. MRN: [id number]   Carrie Myers is a pleasant 75 y.o. female presenting with the following:  Chief Complaint  Patient presents with   Primary osteoarthritis of right knee    Monovisc 88mg /41mL injection right knee; /10 pain    Review of Systems pertinent details above   Patient Active Problem List   Diagnosis Date Noted   Effusion, right knee 12/15/2020   Primary osteoarthritis of right knee 11/24/2020   Overactive bladder 06/18/2020   Prediabetes 04/14/2020   Slow transit constipation 06/05/2019   Sciatica, left side 01/01/2019   Diverticulosis of colon 04/25/2018   Change in bowel habits    Tobacco use disorder, moderate, in sustained remission 12/22/2017   Moderate episode of recurrent major depressive disorder ([place]) 10/03/2017   Inflammatory spondylopathy of lumbar region ([place]) 10/03/2017   Hoarseness, persistent 04/18/2017   OSA on CPAP 02/18/2017   Cervical disc disease 12/31/2016   Generalized osteoarthritis 12/23/2016   Chronic pain syndrome 12/23/2016   Cystocele, midline 12/06/2016   Urinary incontinence 12/06/2016   Impingement syndrome of right shoulder 04/17/2015   Fibrocystic breast 01/18/2015   Gastroesophageal reflux disease without esophagitis 01/18/2015   Hypothyroidism, postablative 01/18/2015   Arteriosclerosis of coronary artery 12/29/2014   DJD of shoulder 11/13/2014   Fibromyalgia 10/02/2014   Intercostal neuralgia 10/02/2014   Facet syndrome, lumbar 10/02/2014   Sacroiliac joint disease 10/02/2014   Carotid artery narrowing 07/09/2014   Benign essential HTN 07/01/2014   Osteoarthritis of thumb 09/24/2012   Mixed hyperlipidemia 09/22/2012   Arthritis of hand, degenerative 09/22/2012   Past Medical History:  Diagnosis Date   Allergy    Anxiety    Arthritis    neck, hands, lower back   Bilateral  dry eyes    Chronic low back pain 12/23/2016   Coronary artery disease    DDD (degenerative disc disease), lumbar 10/02/2014   Dental crowns present    Implants, front "flipper" while waiting for other implants   Emphysema of lung ([place])    Fibromyalgia    Fibromyalgia    Fracture of right foot 01/14/2015   GERD (gastroesophageal reflux disease)    History of Graves' disease    Hyperlipidemia    Hypertension    Hypothyroidism    Myocardial infarction ([place]) 04/22/2006   Osteoarthritis    Other shoulder lesions, right shoulder 04/17/2015   Rheumatoid arthritis ([place])    Sciatica, left side 01/01/2019   Sleep apnea    CPAP   Status post shoulder surgery 05/27/2015   Thyroid disease    Vertigo    none for approx 9 yrs   Outpatient Encounter Medications as of 12/15/2020  Medication Sig   Armodafinil 150 MG tablet Take 150 mg by mouth daily.   aspirin EC 81 MG tablet Take 81 mg by mouth daily.   citalopram (CELEXA) 20 MG tablet TAKE 1 TABLET BY MOUTH  DAILY   COMBIVENT RESPIMAT 20-100 MCG/ACT AERS respimat SMARTSIG:2 Inhalation Via Inhaler 4 Times Daily PRN   cyclobenzaprine (FLEXERIL) 10 MG tablet Take 10 mg by mouth 2 (two) times daily.   Diclofenac-miSOPROStol 75-0.2 MG TBEC Take 1 tablet by mouth 2 (two) times daily.   DULoxetine (CYMBALTA) 60 MG capsule TAKE 1 CAPSULE BY MOUTH  DAILY   esomeprazole (NEXIUM) 20 MG capsule Take 20 mg by mouth 2 (two) times daily before a  meal.   fluocinonide (LIDEX) 0.05 % external solution Apply BID to affected areas on scalp PRN itching   HYDROcodone-acetaminophen (NORCO/VICODIN) 5-325 MG tablet Take 1 tablet by mouth 2 (two) times daily as needed for moderate pain.   HYDROcodone-acetaminophen (NORCO/VICODIN) 5-325 MG tablet Take 1 tablet by mouth 2 (two) times daily.   hydrocortisone 2.5 % cream Apply topically 2 (two) times daily as needed ([place]ash).   ipratropium (ATROVENT) 0.06 % nasal spray Place into both nostrils.   ketoconazole (NIZORAL) 2  % shampoo 1-2 times a week lather on scalp/forehead, leave on 5-8 minutes, rinse well   levocetirizine (XYZAL) 5 MG tablet Take by mouth.   levothyroxine (SYNTHROID) 88 MCG tablet TAKE 1 TABLET BY MOUTH  DAILY   Loperamide HCl (IMODIUM PO) Take 1 tablet by mouth as needed.   losartan (COZAAR) 50 MG tablet Take 50 mg by mouth daily.   meloxicam (MOBIC) 7.5 MG tablet Take 1 tablet (7.5 mg total) by mouth daily as needed for pain.   mirabegron ER (MYRBETRIQ) 50 MG TB24 tablet Take 1 tablet (50 mg total) by mouth daily.   Multiple Vitamin (MULTI-VITAMINS) TABS Take by mouth.   pantoprazole (PROTONIX) 40 MG tablet Take 1 tablet by mouth 2 (two) times daily.   pimecrolimus (ELIDEL) 1 % cream Apply BID to affected areas on face/eyelids   pravastatin (PRAVACHOL) 80 MG tablet Take 80 mg by mouth daily.   pregabalin (LYRICA) 50 MG capsule Take 50 mg by mouth 2 (two) times daily.   tolterodine (DETROL LA) 4 MG 24 hr capsule TAKE 1 CAPSULE(4 MG) BY MOUTH DAILY   No facility-administered encounter medications on file as of 12/15/2020.   Past Surgical History:  Procedure Laterality Date   ABDOMINAL HYSTERECTOMY     total   APPENDECTOMY     BREAST BIOPSY Right    neg   BREAST CYST ASPIRATION Left    neg   BUNIONECTOMY Bilateral    CESAREAN SECTION     COLONOSCOPY  2010   normal   COLONOSCOPY WITH PROPOFOL N/A 04/03/2018   Procedure: COLONOSCOPY WITH BIOPSIES;  Surgeon: Lucilla Lame, MD;  Location: [facility];  Service: Endoscopy;  Laterality: N/A;  sleep apnea   CORONARY ANGIOPLASTY WITH STENT PLACEMENT  04/2006   dental implant     seven   DILATION AND CURETTAGE OF UTERUS     epidural steroid injection  2018   ESOPHAGOGASTRODUODENOSCOPY N/A 10/03/2020   Procedure: ESOPHAGOGASTRODUODENOSCOPY (EGD);  Surgeon: Lesly Rubenstein, MD;  Location: [facility] ENDOSCOPY;  Service: Endoscopy;  Laterality: N/A;  REQUESTING MORNING AFTER 9 AM   EYE SURGERY     bilateral cataract extraction    SHOULDER ARTHROSCOPY Right 05/07/2015   Procedure: Extensive arthroscopic debridement and arthroscopic subacromial decompression, right shoulder.                        2.  Steroid injection right thumb CMC joint.;  Surgeon: Corky Mull, MD;  Location: [place];  Service: Orthopedics;  Laterality: Right;   TOOTH EXTRACTION     Upper centrals and lateral    Vitals:   12/15/20 1129  BP: 138/82  Pulse: 89  Temp: 98.1 F (36.7 C)  SpO2: 99%   Vitals:   12/15/20 1129  Weight: 180 lb (81.6 kg)  Height: 5\' 8"  (1.727 m)   Body mass index is 27.37 kg/m.  No results found.   Independent interpretation of notes and tests performed by  another provider:   None  Procedures performed:   Procedure:  Injection and aspiration of right knee under ultrasound guidance. Ultrasound guidance utilized to confirm presence of effusion, needle placement, confirmation of full aspiration, needle placement for viscosupplementation injection Samsung HS60 device utilized with permanent recording / reporting. Consent obtained and verified. Skin prepped in a sterile fashion. Ethyl chloride spray for topical local analgesia.  Completed without difficulty and tolerated well.  17 mL inflammatory fluid withdrawn. Medication: Monovisc 88 mg (4 mm) Advised to contact for fevers/chills, erythema, induration, drainage, or persistent bleeding.   Pertinent History, Exam, Impression, and Recommendations:   Primary osteoarthritis of right knee Patient tolerated ultrasound-guided aspiration of inflammatory fluid followed by injection of Monovisc to the right knee, post care reviewed.  We can repeat this in 6 months time as indicated.  I did advise the patient to contact her office in 4 weeks if the right knee is still symptomatic, at that time Zilretta versus standard corticosteroid can be injected as an adjunct.  Effusion, right knee See additional assessment(s) for plan details.   Orders &  Medications No orders of the defined types were placed in this encounter.  Orders Placed This Encounter  Procedures   [place] LIMITED JOINT SPACE STRUCTURES LOW RIGHT     Return in about 6 months (around 06/15/2021) for right knee injection.     Montel Culver, MD   Primary Care Sports Medicine [place]

## 2020-12-19 DIAGNOSIS — H18523 Epithelial (juvenile) corneal dystrophy, bilateral: Secondary | ICD-10-CM | POA: Diagnosis not present

## 2020-12-19 DIAGNOSIS — Z961 Presence of intraocular lens: Secondary | ICD-10-CM | POA: Diagnosis not present

## 2020-12-31 ENCOUNTER — Ambulatory Visit: Payer: Medicare Other | Attending: Family Medicine | Admitting: Physical Therapy

## 2020-12-31 ENCOUNTER — Other Ambulatory Visit: Payer: Self-pay

## 2020-12-31 ENCOUNTER — Encounter: Payer: Self-pay | Admitting: Physical Therapy

## 2020-12-31 DIAGNOSIS — M25561 Pain in right knee: Secondary | ICD-10-CM | POA: Insufficient documentation

## 2020-12-31 DIAGNOSIS — G894 Chronic pain syndrome: Secondary | ICD-10-CM | POA: Diagnosis not present

## 2020-12-31 DIAGNOSIS — G8929 Other chronic pain: Secondary | ICD-10-CM | POA: Insufficient documentation

## 2020-12-31 DIAGNOSIS — M6281 Muscle weakness (generalized): Secondary | ICD-10-CM | POA: Diagnosis not present

## 2020-12-31 NOTE — Therapy (Addendum)
[facility] [address] [place], [place], [address] Phone: [phone]   Fax:  [phone]  Physical Therapy Evaluation  Patient Details  Name: Carrie Myers MRN: [id number] Date of Birth: [birth year] Referring Provider (PT): Rosette Reveal, MD  Encounter Date: 12/31/2020   PT End of Session - 01/05/21 0746     Visit Number 1    Number of Visits 17    Date for PT Re-Evaluation 02/25/21    Authorization Type Medicare, VL based on medical necessity    Progress Note Due on Visit 10    PT Start Time 1104    PT Stop Time 1152    PT Time Calculation (min) 48 min    Activity Tolerance Patient limited by pain    Behavior During Therapy [facility] for tasks assessed/performed              Past Medical History:  Diagnosis Date   Allergy    Anxiety    Arthritis    neck, hands, lower back   Bilateral dry eyes    Chronic low back pain 12/23/2016   Coronary artery disease    DDD (degenerative disc disease), lumbar 10/02/2014   Dental crowns present    Implants, front "flipper" while waiting for other implants   Emphysema of lung ([place])    Fibromyalgia    Fibromyalgia    Fracture of right foot 01/14/2015   GERD (gastroesophageal reflux disease)    History of Graves' disease    Hyperlipidemia    Hypertension    Hypothyroidism    Myocardial infarction ([place]) 04/22/2006   Osteoarthritis    Other shoulder lesions, right shoulder 04/17/2015   Rheumatoid arthritis ([place])    Sciatica, left side 01/01/2019   Sleep apnea    CPAP   Status post shoulder surgery 05/27/2015   Thyroid disease    Vertigo    none for approx 9 yrs    Past Surgical History:  Procedure Laterality Date   ABDOMINAL HYSTERECTOMY     total   APPENDECTOMY     BREAST BIOPSY Right    neg   BREAST CYST ASPIRATION Left    neg   BUNIONECTOMY Bilateral    CESAREAN SECTION     COLONOSCOPY  2010   normal   COLONOSCOPY WITH PROPOFOL N/A 04/03/2018    Procedure: COLONOSCOPY WITH BIOPSIES;  Surgeon: Lucilla Lame, MD;  Location: [place];  Service: Endoscopy;  Laterality: N/A;  sleep apnea   CORONARY ANGIOPLASTY WITH STENT PLACEMENT  04/2006   dental implant     seven   DILATION AND CURETTAGE OF UTERUS     epidural steroid injection  2018   ESOPHAGOGASTRODUODENOSCOPY N/A 10/03/2020   Procedure: ESOPHAGOGASTRODUODENOSCOPY (EGD);  Surgeon: Lesly Rubenstein, MD;  Location: [facility] ENDOSCOPY;  Service: Endoscopy;  Laterality: N/A;  REQUESTING MORNING AFTER 9 AM   EYE SURGERY     bilateral cataract extraction   SHOULDER ARTHROSCOPY Right 05/07/2015   Procedure: Extensive arthroscopic debridement and arthroscopic subacromial decompression, right shoulder.                        2.  Steroid injection right thumb CMC joint.;  Surgeon: Corky Mull, MD;  Location: [place];  Service: Orthopedics;  Laterality: Right;   TOOTH EXTRACTION     Upper centrals and lateral    There were no vitals filed for this visit.    Subjective Assessment - 01/05/21  74     Subjective 75 year old female with R knee pain, Hx of effusion. Referring diagnosis of R knee osteoarthritis    Pertinent History 75 year old female with R knee pain, Hx of effusion. Referring diagnosis of R knee osteoarthritis. 3 months ago, symptoms began with notable swelling when pt was leaving restaurant. Pt has been using nebulizer for comorbid emphysema. Patient followed up with physician for RLE edema to rule out DVT. Her symptoms improved with prednisone taper for about 3-4 weeks; she reports her symptoms returned after about 4-6 weeks. Pt had X-rays completed with Dr. Zigmund Daniel. Patient reports primary findings of knee effusion ("fluid") and primary osteoarthritis. She states her knee is not as swollen presently. She has ongoing knee pain presently. Patient has upcoming trip in May 2023 in [place]. Hx of fibromyalgia, stage 1 emphysema (pt uses oxygen at night only, uses  CPAP machine, inhaler prn), hx of MI 14 years ago (stint placed at the time, good cardio check-up since then). Pt reports most pain along medial aspect of R knee. Pt does have Hx of R ankle fracture with 13 weeks of immobilization/non-weightbearing. Pt reports notable stiffness with prolonged sitting; her symptoms are better when on the move. Pt reports improvement in R knee buckling following aspiration of joint. Pt had 1 fall about 2-3 months ago when reaching over edge of curb with container in her hand; no fracture, but notable ecchymosis. Patient reports some balance issues resulting from medication.    Limitations Sitting;Standing;Walking    Diagnostic tests See below    Patient Stated Goals Able to walk on her trip in [place] next year    Currently in Pain? Yes    Pain Score 1     Pain Type Chronic pain    Aggravating Factors  prolonged sitting, prolonged walking, static standing  (pt has to shift weight in standing)    Pain Relieving Factors on the move, short period of walking                     SUBJECTIVE Chief complaint:  75 year old female with R knee pain, Hx of effusion. Referring diagnosis of R knee osteoarthritis  Onset: 75 year old female with R knee pain, Hx of effusion. Referring diagnosis of R knee osteoarthritis. 3 months ago, symptoms began with notable swelling when pt was leaving restaurant. Pt has been using nebulizer for . Patient followed up with physician for RLE edema to rule out DVT. Her symptoms improved with prednisone taper for about 3-4 weeks; she reports her symptoms returned after about 4-6 weeks. Pt had X-rays completed with Dr. Zigmund Daniel. Patient reports primary findings of knee effusion ("fluid") and primary osteoarthritis. She states her knee is not as swollen presently. She has ongoing knee pain presently. Patient has upcoming trip in May 2023 in [place]. Hx of fibromyalgia, stage 1 emphysema (pt uses oxygen at night only, uses CPAP machine, inhaler  prn), hx of MI 14 years ago (stint placed at the time, good cardio check-up since then). Pt reports most pain along medial aspect of R knee. Pt does have Hx of R ankle fracture with 13 weeks of immobilization/non-weightbearing. Pt reports notable stiffness with prolonged sitting; her symptoms are better when on the move. Pt reports improvement in R knee buckling following aspiration of joint. Pt had 1 fall about 2-3 months ago when reaching over edge of curb with container in her hand; no fracture, but notable ecchymosis. Patient reports some balance issues resulting from medication.  Referring Dx: Alfonso Patten  knee osteoarthritis MD: Rosette Reveal, MD Follow-up appt with MD: in 3 months; f/u sooner if viscosupplementation shot isn't helping Pain: 1/10 Present, 0/10 Best, 10/10 Worst: Aggravating factors: prolonged sitting, prolonged walking, static standing  (pt has to shift weight in standing) Easing factors: on the move, short period of walking 24 hour pain behavior: none Knee surgery: No Recent knee trauma: No Prior history of knee injury or pain: No, pt alludes to falls with direct impact onto anterior patellae Pain quality: pain quality: "piercing," shooting pain Radiating symptoms: No  Numbness/Tingling: Yes, hx of L sciatica that is under control now  Imaging: Yes, R knee radiograph demonstreated mild tricompartmental osteoarthritis, joint effusion; R leg ultrasound: (-) DVT screen     OBJECTIVE  MUSCULOSKELETAL: Tremor: Absent Bulk: Normal Tone: Normal, no spasticity, rigidity, or clonus No trophic changes noted to lower extremities. No ecchymosis or erythema. Pt exhibits mild joint line edema No gross knee deformity noted  Posture FHRS posture, rested in posterior pelvic tilt in sitting and standing  Gait Decreased terminal knee extension R>L   Palpation Tenderness to palpation along medical femoral condyle and along MCL, 2+  Strength R/L 4/4 Hip flexion 4+*/5 Hip external  rotation 4/5 Hip internal rotation 4/4 Hip abduction 5/5 Hip adduction 4+/4-* (cramp)  Knee extension 4+/4+ Knee flexion *indicates pain  AROM  Knee R/L Flexion: 135*/140 Extension: 0*/0 *indicates pain  Hip R/L Flexion: R WNL, L WNL Internal Rotation: R WNL, L WNL External Rotation: R WNL, L WNL *indicates pain  Ankle R/L 50/not tested Ankle Plantarflexion 8/not tested Ankle Dorsiflexion 35/not tested Ankle Inversion 25/not testedAnkle Eversion *Indicates Pain  Muscle Length Hamstring length: R lacking 20 degrees, L not tested   Passive Accessory Motion Patella: Significant guarding limiting patellar glide    NEUROLOGICAL:  Mental Status Patient is oriented to person, place and time.  Recent memory is intact.  Remote memory is intact.  Attention span and concentration are intact.  Expressive speech is intact.  Patient's fund of knowledge is within normal limits for educational level.  Sensation Grossly intact to light touch bilateral LEs as determined by testing dermatomes L2-S2 Proprioception and hot/cold testing deferred on this date  Reflexes Deferred  VASCULAR Deferred   SPECIAL TESTS  Meniscus CPR McMurray's (-) No tenderness to palpation at joint line Pain with end-range extension and flexion No Hx of catching/locking/clicking     ASSESSMENT Pt is a pleasant 75 year old female referred for R knee pain with referring diagnosis of R knee osteoarthritis with degenerative changes on radiograph. Pt has significant medical history including emphysema, Hx of MI, anxiety, RA, and Hx of fibromyalgia. PT examination reveals deficits in hip and L quad strength, ankle dorsiflexion mobility, pain with accessing terminal knee extension and mild gait changes, and hyperalgesia of medial knee complex. Pt will benefit from PT services to address deficits in strength, mobility, and pain in order to return to full function at home with less knee pain.        PT Short Term Goals - 12/31/20 1320       PT SHORT TERM GOAL #1   Title Pt will be independent with HEP in order to decrease knee pain and increase flexibility, mobility, and strength in order to improve pain-free function at home and work.    Baseline 12/31/20: Baseline HEP initiated    Time 2    Period Weeks    Status New    Target Date 01/14/21  PT Long Term Goals - 12/31/20 1319       PT LONG TERM GOAL #1   Title Patient will demonstrate improved function as evidenced by a score of 60 on FOTO measure for full participation in activities at home and in the community.    Baseline 12/31/20: FOTO 52    Time 8    Period Weeks    Status New    Target Date 02/24/21      PT LONG TERM GOAL #2   Title Pt will decrease worst pain as reported on NPRS by at least 3 points in order to demonstrate clinically significant reduction in ankle/foot pain.    Baseline 12/31/20: Pain 10/10 at worst    Time 8    Period Weeks    Status New    Target Date 02/24/21      PT LONG TERM GOAL #3   Title Patient will tolerate standing and sitting up to 1 hour without increase in pain > 2/10 as needed for improved ability to complete household and leisure tasks and participate in family events    Baseline 12/31/20: Difficulty standing > 10 minutes, sitting > 15 mins    Time 8    Period Weeks    Status New    Target Date 02/24/21      PT LONG TERM GOAL #4   Title Patient will improve MMT to 4+/5 or greater for all tested LE musculature indicative of improved lower limb strength as needed for impoved ability to tolerate loading during prolonged standing activity and prolonged ambulation    Baseline 12/31/20: 4/5 hip flexion and abduction, 4+ R hip ER, 4- L quads.    Time 8    Period Weeks    Status New    Target Date 02/24/21                    Plan - 01/05/21 0746     Clinical Impression Statement Pt is a pleasant 75 year old female referred for R knee pain  with referring diagnosis of R knee osteoarthritis with degenerative changes on radiograph. Pt has significant medical history including emphysema, Hx of MI, anxiety, RA, and Hx of fibromyalgia. PT examination reveals deficits in hip and L quad strength, ankle dorsiflexion mobility, pain with accessing terminal knee extension and mild gait changes, and hyperalgesia of medial knee complex. Pt will benefit from PT services to address deficits in strength, mobility, and pain in order to return to full function at home with less knee pain.    Personal Factors and Comorbidities Age;Comorbidity 3+;Past/Current Experience    Comorbidities fibromyalgia, heart disease, emphysema, anxiety, hyperlipidemia, hypertension, OA, RA    Examination-Activity Limitations Locomotion Level;Sit;Stand;Stairs;Transfers    Examination-Participation Restrictions Community Activity;Shop    Stability/Clinical Decision Making Evolving/Moderate complexity    Rehab Potential Fair    PT Frequency 2x / week    PT Duration 8 weeks    PT Treatment/Interventions ADLs/Self Care Home Management;Cryotherapy;Electrical Stimulation;Moist Heat;Stair training;Functional mobility training;Therapeutic exercise;Balance training;Neuromuscular re-education;Manual techniques;Taping;Dry needling    PT Next Visit Plan Further testing and assessment of palpation and accessory mobility. Further balance testing.  Address posterior chain soft tissue mobility deficits, strategies for improving access to terminal knee extension, light [place] drills with gradual introduction of weightbearing exercise and functional movements. Progress HEP.    PT Home Exercise Plan Access Code [email or web address]    Consulted and Agree with Plan of Care Patient  Patient will benefit from skilled therapeutic intervention in order to improve the following deficits and impairments:  Abnormal gait, Decreased strength, Pain, Hypomobility, Difficulty walking  Visit  Diagnosis: Chronic pain of right knee - Plan: PT plan of care cert/re-cert  Muscle weakness (generalized) - Plan: PT plan of care cert/re-cert  Chronic pain syndrome - Plan: PT plan of care cert/re-cert     Problem List Patient Active Problem List   Diagnosis Date Noted   Effusion, right knee 12/15/2020   Primary osteoarthritis of right knee 11/24/2020   Overactive bladder 06/18/2020   Prediabetes 04/14/2020   Slow transit constipation 06/05/2019   Sciatica, left side 01/01/2019   Diverticulosis of colon 04/25/2018   Change in bowel habits    Tobacco use disorder, moderate, in sustained remission 12/22/2017   Moderate episode of recurrent major depressive disorder ([place]) 10/03/2017   Inflammatory spondylopathy of lumbar region ([place]) 10/03/2017   Hoarseness, persistent 04/18/2017   OSA on CPAP 02/18/2017   Cervical disc disease 12/31/2016   Generalized osteoarthritis 12/23/2016   Chronic pain syndrome 12/23/2016   Cystocele, midline 12/06/2016   Urinary incontinence 12/06/2016   Impingement syndrome of right shoulder 04/17/2015   Fibrocystic breast 01/18/2015   Gastroesophageal reflux disease without esophagitis 01/18/2015   Hypothyroidism, postablative 01/18/2015   Arteriosclerosis of coronary artery 12/29/2014   DJD of shoulder 11/13/2014   Fibromyalgia 10/02/2014   Intercostal neuralgia 10/02/2014   Facet syndrome, lumbar 10/02/2014   Sacroiliac joint disease 10/02/2014   Carotid artery narrowing 07/09/2014   Benign essential HTN 07/01/2014   Osteoarthritis of thumb 09/24/2012   Mixed hyperlipidemia 09/22/2012   Arthritis of hand, degenerative 09/22/2012   *Addendum to correct incomplete sentence in subjective and typographical error in Plan section*  Valentina Gu, PT, DPT #[id number]  Eilleen Kempf, PT 01/05/2021, 7:50 AM  [facility] [address] [place], [place], [address] Phone: [phone]   Fax:   [phone]  Name: Carrie Myers MRN: [id number] Date of Birth: [birth year]

## 2021-01-05 ENCOUNTER — Ambulatory Visit: Payer: Medicare Other | Admitting: Physical Therapy

## 2021-01-05 ENCOUNTER — Other Ambulatory Visit: Payer: Self-pay

## 2021-01-05 ENCOUNTER — Encounter: Payer: Self-pay | Admitting: Physical Therapy

## 2021-01-05 DIAGNOSIS — G8929 Other chronic pain: Secondary | ICD-10-CM

## 2021-01-05 DIAGNOSIS — G894 Chronic pain syndrome: Secondary | ICD-10-CM

## 2021-01-05 DIAGNOSIS — M6281 Muscle weakness (generalized): Secondary | ICD-10-CM | POA: Diagnosis not present

## 2021-01-05 DIAGNOSIS — M25561 Pain in right knee: Secondary | ICD-10-CM | POA: Diagnosis not present

## 2021-01-05 NOTE — Therapy (Signed)
[facility] [address] [place], [place], [address] Phone: [phone]   Fax:  [phone]  Physical Therapy Treatment  Patient Details  Name: Carrie Myers MRN: [id number] Date of Birth: Nov 22, 1945 Referring Provider (PT): Rosette Reveal, MD   Encounter Date: 01/05/2021   PT End of Session - 01/05/21 1157     Visit Number 2    Number of Visits 17    Date for PT Re-Evaluation 02/25/21    Authorization Type Medicare, VL based on medical necessity    Progress Note Due on Visit 10    PT Start Time 1120    PT Stop Time 1208    PT Time Calculation (min) 48 min    Equipment Utilized During Treatment Gait belt    Activity Tolerance Patient limited by pain    Behavior During Therapy [facility] for tasks assessed/performed             Past Medical History:  Diagnosis Date   Allergy    Anxiety    Arthritis    neck, hands, lower back   Bilateral dry eyes    Chronic low back pain 12/23/2016   Coronary artery disease    DDD (degenerative disc disease), lumbar 10/02/2014   Dental crowns present    Implants, front "flipper" while waiting for other implants   Emphysema of lung ([place])    Fibromyalgia    Fibromyalgia    Fracture of right foot 01/14/2015   GERD (gastroesophageal reflux disease)    History of Graves' disease    Hyperlipidemia    Hypertension    Hypothyroidism    Myocardial infarction ([place]) 04/22/2006   Osteoarthritis    Other shoulder lesions, right shoulder 04/17/2015   Rheumatoid arthritis ([place])    Sciatica, left side 01/01/2019   Sleep apnea    CPAP   Status post shoulder surgery 05/27/2015   Thyroid disease    Vertigo    none for approx 9 yrs    Past Surgical History:  Procedure Laterality Date   ABDOMINAL HYSTERECTOMY     total   APPENDECTOMY     BREAST BIOPSY Right    neg   BREAST CYST ASPIRATION Left    neg   BUNIONECTOMY Bilateral    CESAREAN SECTION     COLONOSCOPY  2010   normal    COLONOSCOPY WITH PROPOFOL N/A 04/03/2018   Procedure: COLONOSCOPY WITH BIOPSIES;  Surgeon: Lucilla Lame, MD;  Location: [facility];  Service: Endoscopy;  Laterality: N/A;  sleep apnea   CORONARY ANGIOPLASTY WITH STENT PLACEMENT  04/2006   dental implant     [age]   DILATION AND CURETTAGE OF UTERUS     epidural steroid injection  2018   ESOPHAGOGASTRODUODENOSCOPY N/A 10/03/2020   Procedure: ESOPHAGOGASTRODUODENOSCOPY (EGD);  Surgeon: Lesly Rubenstein, MD;  Location: [facility] ENDOSCOPY;  Service: Endoscopy;  Laterality: N/A;  REQUESTING MORNING AFTER 9 AM   EYE SURGERY     bilateral cataract extraction   SHOULDER ARTHROSCOPY Right 05/07/2015   Procedure: Extensive arthroscopic debridement and arthroscopic subacromial decompression, right shoulder.                        2.  Steroid injection right thumb CMC joint.;  Surgeon: Corky Mull, MD;  Location: [facility];  Service: Orthopedics;  Laterality: Right;   TOOTH EXTRACTION     Upper centrals and lateral    There were no vitals filed for  this visit.   Subjective Assessment - 01/05/21 1155     Subjective Patient reports compliance with initial home exercises including calf and hamsring stretching. She reports not using stationary bike yet at home as discussed at IE. Patient reports that medial knee pain does feel distinct from her existing fibromyalgia symptoms. Patient reports feeling more sore from high volume of standing/weightbearing activity over the weekend. Patient reports 3-4/10 pain at arrival to PT.    Patient is accompained by: Family member    Pertinent History 75 year old female with R knee pain, Hx of effusion. Referring diagnosis of R knee osteoarthritis. 3 months ago, symptoms began with notable swelling when pt was leaving restaurant. Pt has been using nebulizer for comorbid emphysema. Patient followed up with physician for RLE edema to rule out DVT. Her symptoms improved with prednisone taper for about 3-4  weeks; she reports her symptoms returned after about 4-6 weeks. Pt had X-rays completed with Dr. Zigmund Daniel. Patient reports primary findings of knee effusion ("fluid") and primary osteoarthritis. She states her knee is not as swollen presently. She has ongoing knee pain presently. Patient has upcoming trip in May 2023 in [place]. Hx of fibromyalgia, stage 1 emphysema (pt uses oxygen at night only, uses CPAP machine, inhaler prn), hx of MI 14 years ago (stint placed at the time, good cardio check-up since then). Pt reports most pain along medial aspect of R knee. Pt does have Hx of R ankle fracture with 13 weeks of immobilization/non-weightbearing. Pt reports notable stiffness with prolonged sitting; her symptoms are better when on the move. Pt reports improvement in R knee buckling following aspiration of joint. Pt had 1 fall about 2-3 months ago when reaching over edge of curb with container in her hand; no fracture, but notable ecchymosis. Patient reports some balance issues resulting from medication.    Limitations Sitting;Standing;Walking    How long can you stand comfortably? <10 min    How long can you walk comfortably? 5 min    Diagnostic tests See below    Patient Stated Goals Able to walk on her trip in [place] next year    Currently in Pain? Yes    Pain Score 4     Pain Onset More than a month ago               OBJECTIVE FINDINGS  Palpation Tenderness to palpation along medial femoral condyle 2+, R VMO 2+   POSTURAL CONTROL TESTS   Romberg Test: FT, eyes open WNL FT, eyes closed WNL   Modified Clinical Test of Sensory Interaction for Balance    (CTSIB):  CONDITION TIME STRATEGY SWAY  Eyes open, firm surface 30 seconds ankle WNL  Eyes closed, firm surface 26 seconds ankle Moderate anterior/posterior sway  Eyes open, foam surface 30 seconds ankle Circular, alternating L/R  Eyes closed, foam surface 19 sec ankle  Significant anterior/posterior sway   Accessory  Mobility Patella: Significantly decreased superior/inferior glide R knee      TREATMENT   Manual Therapy - for symptom modulation, soft tissue sensitivity and mobility, joint mobility, ROM   Patellar mobilization, grade III, all planes; x 5 min Anterior tibiofemoral mobilization, gr I-II Gentle R knee PROM within pt tolerance *Poor therapeutic response with attempted TF distraction    Neuromuscular Re-education - for nervous system downregulation, quadriceps activation, and exercises to promote LE kinetic chain stability  NuStep, Level 2; x 5 minutes, seat at 9 and handlebars at 8  Postural control testing (see above)  Therapeutic Exercise - for improved soft tissue flexibility and extensibility as needed for ROM, improved strength as needed to improve performance of CKC activities/functional movements  Short arc quad; 2x10, 5 sec  HEP review: Seated hamstring stretch; x30sec Seated gastrocnemius stretch, with sheet around foot, x30sec  *HEP update today - added SAQ*    Cold pack (unbilled) - for anti-inflammatory and analgesic effect as needed for reduced pain and improved tolerance of activities in clinic, along R knee anteriorly in supine, with pillow under knee x 5 minutes     ASSESSMENT Patient arrives with excellent motivation to participate in physical therapy. She has continuous medial knee pain and pain with terminal knee extension. She tolerates end-range flexion better today than last visit. Pain with end-range knee extension is improved following manual therapy today. Patient has normal Romberg test exhibited today. Pt demonstrates apparent visual reliance for postural control with modified CTSIB. Pt tolerates treatment well today with moderate knee pain after postural control testing; this is reduced with use of cold pack. Patient has remaining deficits in ROM, mobility, and pain. Patient will benefit from continued skilled therapeutic intervention to address  the above deficits as needed for improved function and QoL.      PT Short Term Goals - 12/31/20 1320       PT SHORT TERM GOAL #1   Title Pt will be independent with HEP in order to decrease knee pain and increase flexibility, mobility, and strength in order to improve pain-free function at home and work.    Baseline 12/31/20: Baseline HEP initiated    Time 2    Period Weeks    Status New    Target Date 01/14/21               PT Long Term Goals - 12/31/20 1319       PT LONG TERM GOAL #1   Title Patient will demonstrate improved function as evidenced by a score of 60 on FOTO measure for full participation in activities at home and in the community.    Baseline 12/31/20: FOTO 52    Time 8    Period Weeks    Status New    Target Date 02/24/21      PT LONG TERM GOAL #2   Title Pt will decrease worst pain as reported on NPRS by at least 3 points in order to demonstrate clinically significant reduction in ankle/foot pain.    Baseline 12/31/20: Pain 10/10 at worst    Time 8    Period Weeks    Status New    Target Date 02/24/21      PT LONG TERM GOAL #3   Title Patient will tolerate standing and sitting up to 1 hour without increase in pain > 2/10 as needed for improved ability to complete household and leisure tasks and participate in family events    Baseline 12/31/20: Difficulty standing > 10 minutes, sitting > 15 mins    Time 8    Period Weeks    Status New    Target Date 02/24/21      PT LONG TERM GOAL #4   Title Patient will improve MMT to 4+/5 or greater for all tested LE musculature indicative of improved lower limb strength as needed for impoved ability to tolerate loading during prolonged standing activity and prolonged ambulation    Baseline 12/31/20: 4/5 hip flexion and abduction, 4+ R hip ER, 4- L quads.    Time 8    Period Weeks  Status New    Target Date 02/24/21                   Plan - 01/05/21 1703     Clinical Impression Statement  Patient arrives with excellent motivation to participate in physical therapy. She has continuous medial knee pain and pain with terminal knee extension. She tolerates end-range flexion better today than last visit. Pain with end-range knee extension is improved following manual therapy today. Patient has normal Romberg test exhibited today. Pt demonstrates apparent visual reliance for postural control with modified CTSIB. Pt tolerates treatment well today with moderate knee pain after postural control testing; this is reduced with use of cold pack. Patient has remaining deficits in ROM, mobility, and pain. Patient will benefit from continued skilled therapeutic intervention to address the above deficits as needed for improved function and QoL.    Personal Factors and Comorbidities Age;Comorbidity 3+;Past/Current Experience    Comorbidities fibromyalgia, heart disease, emphysema, anxiety, hyperlipidemia, hypertension, OA, RA    Examination-Activity Limitations Locomotion Level;Sit;Stand;Stairs;Transfers    Examination-Participation Restrictions Community Activity;Shop    Stability/Clinical Decision Making Evolving/Moderate complexity    Rehab Potential Fair    PT Frequency 2x / week    PT Duration 8 weeks    PT Treatment/Interventions ADLs/Self Care Home Management;Cryotherapy;Electrical Stimulation;Moist Heat;Stair training;Functional mobility training;Therapeutic exercise;Balance training;Neuromuscular re-education;Manual techniques;Taping;Dry needling    PT Next Visit Plan Further testing and assessment of palpation and accessory mobility. Further balance testing.  Address posterior chain soft tissue mobility deficits, strategies for improving access to terminal knee extension, light OKC drills with gradual introduction of weightbearing exercise and functional movements. Progress HEP.    PT Home Exercise Plan Access Code [email or web address]    [email or web address] and Agree with Plan of Care Patient              Patient will benefit from skilled therapeutic intervention in order to improve the following deficits and impairments:  Abnormal gait, Decreased strength, Pain, Hypomobility, Difficulty walking  Visit Diagnosis: Chronic pain of right knee  Muscle weakness (generalized)  Chronic pain syndrome     Problem List Patient Active Problem List   Diagnosis Date Noted   Effusion, right knee 12/15/2020   Primary osteoarthritis of right knee 11/24/2020   Overactive bladder 06/18/2020   Prediabetes 04/14/2020   Slow transit constipation 06/05/2019   Sciatica, left side 01/01/2019   Diverticulosis of colon 04/25/2018   Change in bowel habits    Tobacco use disorder, moderate, in sustained remission 12/22/2017   Moderate episode of recurrent major depressive disorder ([place]) 10/03/2017   Inflammatory spondylopathy of lumbar region ([place]) 10/03/2017   Hoarseness, persistent 04/18/2017   OSA on CPAP 02/18/2017   Cervical disc disease 12/31/2016   Generalized osteoarthritis 12/23/2016   Chronic pain syndrome 12/23/2016   Cystocele, midline 12/06/2016   Urinary incontinence 12/06/2016   Impingement syndrome of right shoulder 04/17/2015   Fibrocystic breast 01/18/2015   Gastroesophageal reflux disease without esophagitis 01/18/2015   Hypothyroidism, postablative 01/18/2015   Arteriosclerosis of coronary artery 12/29/2014   DJD of shoulder 11/13/2014   Fibromyalgia 10/02/2014   Intercostal neuralgia 10/02/2014   Facet syndrome, lumbar 10/02/2014   Sacroiliac joint disease 10/02/2014   Carotid artery narrowing 07/09/2014   Benign essential HTN 07/01/2014   Osteoarthritis of thumb 09/24/2012   Mixed hyperlipidemia 09/22/2012   Arthritis of hand, degenerative 09/22/2012   Valentina Gu, PT, DPT #[id number]  Eilleen Kempf, PT 01/05/2021, 5:04 PM  [place]  [facility] [address] [place], [place], [address] Phone: [phone]   Fax:   [phone]  Name: Carrie Myers MRN: [id number] Date of Birth: 1945-12-03

## 2021-01-08 ENCOUNTER — Encounter: Payer: Self-pay | Admitting: Physical Therapy

## 2021-01-08 ENCOUNTER — Other Ambulatory Visit: Payer: Self-pay

## 2021-01-08 ENCOUNTER — Ambulatory Visit: Payer: Medicare Other | Admitting: Physical Therapy

## 2021-01-08 DIAGNOSIS — G8929 Other chronic pain: Secondary | ICD-10-CM | POA: Diagnosis not present

## 2021-01-08 DIAGNOSIS — G894 Chronic pain syndrome: Secondary | ICD-10-CM | POA: Diagnosis not present

## 2021-01-08 DIAGNOSIS — M6281 Muscle weakness (generalized): Secondary | ICD-10-CM

## 2021-01-08 DIAGNOSIS — M25561 Pain in right knee: Secondary | ICD-10-CM | POA: Diagnosis not present

## 2021-01-08 NOTE — Therapy (Signed)
[place] [address]. [place], [place], [address] Phone: [phone]   Fax:  [phone]  Physical Therapy Treatment  Patient Details  Name: Carrie Myers MRN: [id number] Date of Birth: May 10, 1945 Referring Provider (PT): Rosette Reveal, MD   Encounter Date: 01/08/2021   PT End of Session - 01/12/21 0541     Visit Number 3    Number of Visits 17    Date for PT Re-Evaluation 02/25/21    Authorization Type Medicare, VL based on medical necessity    Progress Note Due on Visit 10    PT Start Time 1301    PT Stop Time 1345    PT Time Calculation (min) 44 min    Equipment Utilized During Treatment Gait belt    Activity Tolerance Patient limited by pain    Behavior During Therapy [facility] for tasks assessed/performed             Past Medical History:  Diagnosis Date   Allergy    Anxiety    Arthritis    neck, hands, lower back   Bilateral dry eyes    Chronic low back pain 12/23/2016   Coronary artery disease    DDD (degenerative disc disease), lumbar 10/02/2014   Dental crowns present    Implants, front "flipper" while waiting for other implants   Emphysema of lung ([place])    Fibromyalgia    Fibromyalgia    Fracture of right foot 01/14/2015   GERD (gastroesophageal reflux disease)    History of Graves' disease    Hyperlipidemia    Hypertension    Hypothyroidism    Myocardial infarction ([place]) 04/22/2006   Osteoarthritis    Other shoulder lesions, right shoulder 04/17/2015   Rheumatoid arthritis ([place])    Sciatica, left side 01/01/2019   Sleep apnea    CPAP   Status post shoulder surgery 05/27/2015   Thyroid disease    Vertigo    none for approx 9 yrs    Past Surgical History:  Procedure Laterality Date   ABDOMINAL HYSTERECTOMY     total   APPENDECTOMY     BREAST BIOPSY Right    neg   BREAST CYST ASPIRATION Left    neg   BUNIONECTOMY Bilateral    CESAREAN SECTION     COLONOSCOPY  2010   normal    COLONOSCOPY WITH PROPOFOL N/A 04/03/2018   Procedure: COLONOSCOPY WITH BIOPSIES;  Surgeon: Lucilla Lame, MD;  Location: [place];  Service: Endoscopy;  Laterality: N/A;  sleep apnea   CORONARY ANGIOPLASTY WITH STENT PLACEMENT  04/2006   dental implant     [age]   DILATION AND CURETTAGE OF UTERUS     epidural steroid injection  2018   ESOPHAGOGASTRODUODENOSCOPY N/A 10/03/2020   Procedure: ESOPHAGOGASTRODUODENOSCOPY (EGD);  Surgeon: Lesly Rubenstein, MD;  Location: [facility] ENDOSCOPY;  Service: Endoscopy;  Laterality: N/A;  REQUESTING MORNING AFTER 9 AM   EYE SURGERY     bilateral cataract extraction   SHOULDER ARTHROSCOPY Right 05/07/2015   Procedure: Extensive arthroscopic debridement and arthroscopic subacromial decompression, right shoulder.                        2.  Steroid injection right thumb CMC joint.;  Surgeon: Corky Mull, MD;  Location: [place];  Service: Orthopedics;  Laterality: Right;   TOOTH EXTRACTION     Upper centrals and lateral    There were no vitals filed for  this visit.   Subjective Assessment - 01/12/21 0541     Subjective Patient reports reoccurrence of sciatica Tuesday afternoon after bending to put items in her brother's minifridge in nursing home. Patient reports that she used pain medicine and rested yesterday to mitigate symptoms. She feels that it is not as apparent today. Patient reports more pain along popliteal region recently. She reports not hurting significantly at arrival.    Patient is accompained by: Family member    Pertinent History 75 year old female with R knee pain, Hx of effusion. Referring diagnosis of R knee osteoarthritis. 3 months ago, symptoms began with notable swelling when pt was leaving restaurant. Pt has been using nebulizer for comorbid emphysema. Patient followed up with physician for RLE edema to rule out DVT. Her symptoms improved with prednisone taper for about 3-4 weeks; she reports her symptoms returned after  about 4-6 weeks. Pt had X-rays completed with Dr. Zigmund Daniel. Patient reports primary findings of knee effusion ("fluid") and primary osteoarthritis. She states her knee is not as swollen presently. She has ongoing knee pain presently. Patient has upcoming trip in May 2023 in [place]. Hx of fibromyalgia, stage 1 emphysema (pt uses oxygen at night only, uses CPAP machine, inhaler prn), hx of MI 14 years ago (stint placed at the time, good cardio check-up since then). Pt reports most pain along medial aspect of R knee. Pt does have Hx of R ankle fracture with 13 weeks of immobilization/non-weightbearing. Pt reports notable stiffness with prolonged sitting; her symptoms are better when on the move. Pt reports improvement in R knee buckling following aspiration of joint. Pt had 1 fall about 2-3 months ago when reaching over edge of curb with container in her hand; no fracture, but notable ecchymosis. Patient reports some balance issues resulting from medication.    Limitations Sitting;Standing;Walking    How long can you stand comfortably? <10 min    How long can you walk comfortably? 5 min    Diagnostic tests See below    Patient Stated Goals Able to walk on her trip in [place] next year    Pain Onset More than a month ago               TREATMENT     Manual Therapy - for symptom modulation, soft tissue sensitivity and mobility, joint mobility, ROM    Patellar mobilization, grade III, all planes; x 5 min Anterior tibiofemoral mobilization, gr I-II Gentle R knee PROM within pt tolerance  *not today* TF distraction      Neuromuscular Re-education - for nervous system downregulation, quadriceps activation, and exercises to promote LE kinetic chain stability   NuStep, Level 2; x 5 minutes, seat at 8 and handlebars at 8 for nervous system downregulation to decrease     Therapeutic Exercise - for improved soft tissue flexibility and extensibility as needed for ROM, improved strength as needed to  improve performance of CKC activities/functional movements   Short arc quad; 2x10, 3 sec; 3-lb weight, bilat Straight leg raise; 2x8, bilat [verbal and tactile cueing for ROM and quadriceps contraction] Sidelying hip abduction; 2x10; bilat Standing heel raise with heel drop off edge of step for plantarflexor stretch; 2x10  Pt edu: Discussed resuming exercises from previous episode of PT focused on LBP/sciatica; reviewed hip stretching given previously.   *not today* Minisquat; light touch on edge of table; 2x10      *not today* Cold pack (unbilled) - for anti-inflammatory and analgesic effect as needed for reduced pain and  improved tolerance of activities in clinic, along R knee anteriorly in supine, with pillow under knee x 5 minutes         ASSESSMENT Patient arrives with excellent motivation to participate in physical therapy today. She is able to access full flexion and extension of R knee following manual therapy. She had recent flare-up of comorbid sciatic-type symptoms following putting items into her brother's mini-fridge in quadruped/kneeling position. This has notably improved, but patient does question today how to address this comorbid issue. Discussed previous episode of care for PT for this condition and resuming exercises in her HEP; reviewed piriformis stretching given for previous HEP. Pt tolerates progression of OKC isotonics well today without c/o increased pain, although she does have fleeting L quadriceps cramping with SLR. Patient has remaining deficits in ROM, mobility, and pain. Patient will benefit from continued skilled therapeutic intervention to address the above deficits as needed for improved function and QoL.            PT Short Term Goals - 12/31/20 1320       PT SHORT TERM GOAL #1   Title Pt will be independent with HEP in order to decrease knee pain and increase flexibility, mobility, and strength in order to improve pain-free function at home and  work.    Baseline 12/31/20: Baseline HEP initiated    Time 2    Period Weeks    Status New    Target Date 01/14/21               PT Long Term Goals - 12/31/20 1319       PT LONG TERM GOAL #1   Title Patient will demonstrate improved function as evidenced by a score of 60 on FOTO measure for full participation in activities at home and in the community.    Baseline 12/31/20: FOTO 52    Time 8    Period Weeks    Status New    Target Date 02/24/21      PT LONG TERM GOAL #2   Title Pt will decrease worst pain as reported on NPRS by at least 3 points in order to demonstrate clinically significant reduction in ankle/foot pain.    Baseline 12/31/20: Pain 10/10 at worst    Time 8    Period Weeks    Status New    Target Date 02/24/21      PT LONG TERM GOAL #3   Title Patient will tolerate standing and sitting up to 1 hour without increase in pain > 2/10 as needed for improved ability to complete household and leisure tasks and participate in family events    Baseline 12/31/20: Difficulty standing > 10 minutes, sitting > 15 mins    Time 8    Period Weeks    Status New    Target Date 02/24/21      PT LONG TERM GOAL #4   Title Patient will improve MMT to 4+/5 or greater for all tested LE musculature indicative of improved lower limb strength as needed for impoved ability to tolerate loading during prolonged standing activity and prolonged ambulation    Baseline 12/31/20: 4/5 hip flexion and abduction, 4+ R hip ER, 4- L quads.    Time 8    Period Weeks    Status New    Target Date 02/24/21                   Plan - 01/12/21 0548     Clinical Impression  Statement Patient arrives with excellent motivation to participate in physical therapy today. She is able to access full flexion and extension of R knee following manual therapy. She had recent flare-up of comorbid sciatic-type symptoms following putting items into her brother's mini-fridge in quadruped/kneeling  position. This has notably improved, but patient does question today how to address this comorbid issue. Discussed previous episode of care for PT for this condition and resuming exercises in her HEP; reviewed piriformis stretching given for previous HEP. Pt tolerates progression of OKC isotonics well today without c/o increased pain, although she does have fleeting L quadriceps cramping with SLR. Patient has remaining deficits in ROM, mobility, and pain. Patient will benefit from continued skilled therapeutic intervention to address the above deficits as needed for improved function and QoL.    Personal Factors and Comorbidities Age;Comorbidity 3+;Past/Current Experience    Comorbidities fibromyalgia, heart disease, emphysema, anxiety, hyperlipidemia, hypertension, OA, RA    Examination-Activity Limitations Locomotion Level;Sit;Stand;Stairs;Transfers    Examination-Participation Restrictions Community Activity;Shop    Stability/Clinical Decision Making Evolving/Moderate complexity    Rehab Potential Fair    PT Frequency 2x / week    PT Duration 8 weeks    PT Treatment/Interventions ADLs/Self Care Home Management;Cryotherapy;Electrical Stimulation;Moist Heat;Stair training;Functional mobility training;Therapeutic exercise;Balance training;Neuromuscular re-education;Manual techniques;Taping;Dry needling    PT Next Visit Plan Address posterior chain soft tissue mobility deficits, strategies for improving access to terminal knee extension, light OKC drills with gradual introduction of weightbearing exercise and functional movements. Progress HEP as needed.    PT Home Exercise Plan Access Code [email or web address]    [email or web address] and Agree with Plan of Care Patient             Patient will benefit from skilled therapeutic intervention in order to improve the following deficits and impairments:  Abnormal gait, Decreased strength, Pain, Hypomobility, Difficulty walking  Visit Diagnosis: Chronic pain of right  knee  Muscle weakness (generalized)  Chronic pain syndrome     Problem List Patient Active Problem List   Diagnosis Date Noted   Effusion, right knee 12/15/2020   Primary osteoarthritis of right knee 11/24/2020   Overactive bladder 06/18/2020   Prediabetes 04/14/2020   Slow transit constipation 06/05/2019   Sciatica, left side 01/01/2019   Diverticulosis of colon 04/25/2018   Change in bowel habits    Tobacco use disorder, moderate, in sustained remission 12/22/2017   Moderate episode of recurrent major depressive disorder ([place]) 10/03/2017   Inflammatory spondylopathy of lumbar region ([place]) 10/03/2017   Hoarseness, persistent 04/18/2017   OSA on CPAP 02/18/2017   Cervical disc disease 12/31/2016   Generalized osteoarthritis 12/23/2016   Chronic pain syndrome 12/23/2016   Cystocele, midline 12/06/2016   Urinary incontinence 12/06/2016   Impingement syndrome of right shoulder 04/17/2015   Fibrocystic breast 01/18/2015   Gastroesophageal reflux disease without esophagitis 01/18/2015   Hypothyroidism, postablative 01/18/2015   Arteriosclerosis of coronary artery 12/29/2014   DJD of shoulder 11/13/2014   Fibromyalgia 10/02/2014   Intercostal neuralgia 10/02/2014   Facet syndrome, lumbar 10/02/2014   Sacroiliac joint disease 10/02/2014   Carotid artery narrowing 07/09/2014   Benign essential HTN 07/01/2014   Osteoarthritis of thumb 09/24/2012   Mixed hyperlipidemia 09/22/2012   Arthritis of hand, degenerative 09/22/2012   Valentina Gu, PT, DPT #[id number]  Eilleen Kempf, PT 01/12/2021, 5:49 AM   [facility] [address]. [place], [place], [address] Phone: [phone]   Fax:  [phone]  Name: Carrie Myers MRN: [id number] Date  of Birth: 1945-09-07

## 2021-01-12 ENCOUNTER — Other Ambulatory Visit: Payer: Self-pay

## 2021-01-12 ENCOUNTER — Ambulatory Visit: Payer: Medicare Other | Admitting: Physical Therapy

## 2021-01-12 DIAGNOSIS — M6281 Muscle weakness (generalized): Secondary | ICD-10-CM

## 2021-01-12 DIAGNOSIS — G894 Chronic pain syndrome: Secondary | ICD-10-CM

## 2021-01-12 DIAGNOSIS — M25561 Pain in right knee: Secondary | ICD-10-CM | POA: Diagnosis not present

## 2021-01-12 DIAGNOSIS — G8929 Other chronic pain: Secondary | ICD-10-CM | POA: Diagnosis not present

## 2021-01-12 NOTE — Therapy (Addendum)
[facility] [address] [place], [place], [address] Phone: [phone]   Fax:  [phone]  Physical Therapy Treatment  Patient Details  Name: Carrie Myers MRN: [id number] Date of Birth: [birth year] Referring Provider (PT): Rosette Reveal, MD   Encounter Date: 01/12/2021   PT End of Session - 01/12/21 1534     Visit Number 4    Number of Visits 17    Date for PT Re-Evaluation 02/25/21    Authorization Type Medicare, VL based on medical necessity    Progress Note Due on Visit 10    PT Start Time 1505    PT Stop Time 1545    PT Time Calculation (min) 40 min    Equipment Utilized During Treatment Gait belt    Activity Tolerance Patient limited by pain    Behavior During Therapy WFL for tasks assessed/performed             Past Medical History:  Diagnosis Date   Allergy    Anxiety    Arthritis    neck, hands, lower back   Bilateral dry eyes    Chronic low back pain 12/23/2016   Coronary artery disease    DDD (degenerative disc disease), lumbar 10/02/2014   Dental crowns present    Implants, front "flipper" while waiting for other implants   Emphysema of lung ([place])    Fibromyalgia    Fibromyalgia    Fracture of right foot 01/14/2015   GERD (gastroesophageal reflux disease)    History of Graves' disease    Hyperlipidemia    Hypertension    Hypothyroidism    Myocardial infarction ([place]) 04/22/2006   Osteoarthritis    Other shoulder lesions, right shoulder 04/17/2015   Rheumatoid arthritis ([place])    Sciatica, left side 01/01/2019   Sleep apnea    CPAP   Status post shoulder surgery 05/27/2015   Thyroid disease    Vertigo    none for approx 9 yrs    Past Surgical History:  Procedure Laterality Date   ABDOMINAL HYSTERECTOMY     total   APPENDECTOMY     BREAST BIOPSY Right    neg   BREAST CYST ASPIRATION Left    neg   BUNIONECTOMY Bilateral    CESAREAN SECTION     COLONOSCOPY  2010   normal    COLONOSCOPY WITH PROPOFOL N/A 04/03/2018   Procedure: COLONOSCOPY WITH BIOPSIES;  Surgeon: Lucilla Lame, MD;  Location: [facility];  Service: Endoscopy;  Laterality: N/A;  sleep apnea   CORONARY ANGIOPLASTY WITH STENT PLACEMENT  04/2006   dental implant     [age]   DILATION AND CURETTAGE OF UTERUS     epidural steroid injection  2018   ESOPHAGOGASTRODUODENOSCOPY N/A 10/03/2020   Procedure: ESOPHAGOGASTRODUODENOSCOPY (EGD);  Surgeon: Lesly Rubenstein, MD;  Location: [facility] ENDOSCOPY;  Service: Endoscopy;  Laterality: N/A;  REQUESTING MORNING AFTER 9 AM   EYE SURGERY     bilateral cataract extraction   SHOULDER ARTHROSCOPY Right 05/07/2015   Procedure: Extensive arthroscopic debridement and arthroscopic subacromial decompression, right shoulder.                        2.  Steroid injection right thumb CMC joint.;  Surgeon: Corky Mull, MD;  Location: [facility];  Service: Orthopedics;  Laterality: Right;   TOOTH EXTRACTION     Upper centrals and lateral    There were no vitals filed for  this visit.   Subjective Assessment - 01/12/21 1512     Subjective Patient reports feeling relatively well this afternoon compared to last visit. Patient reports she has been standing a lot today. She reports this does cause her R knee to hurt more this afternoon. She reports vague pain around knee anteriorly (peripatellar). 3-4/10 knee pain at arrival with weightbearing.    Patient is accompained by: Family member    Pertinent History 75 year old female with R knee pain, Hx of effusion. Referring diagnosis of R knee osteoarthritis. 3 months ago, symptoms began with notable swelling when pt was leaving restaurant. Pt has been using nebulizer for comorbid emphysema. Patient followed up with physician for RLE edema to rule out DVT. Her symptoms improved with prednisone taper for about 3-4 weeks; she reports her symptoms returned after about 4-6 weeks. Pt had X-rays completed with Dr. Zigmund Daniel.  Patient reports primary findings of knee effusion ("fluid") and primary osteoarthritis. She states her knee is not as swollen presently. She has ongoing knee pain presently. Patient has upcoming trip in May 2023 in [place]. Hx of fibromyalgia, stage 1 emphysema (pt uses oxygen at night only, uses CPAP machine, inhaler prn), hx of MI 14 years ago (stint placed at the time, good cardio check-up since then). Pt reports most pain along medial aspect of R knee. Pt does have Hx of R ankle fracture with 13 weeks of immobilization/non-weightbearing. Pt reports notable stiffness with prolonged sitting; her symptoms are better when on the move. Pt reports improvement in R knee buckling following aspiration of joint. Pt had 1 fall about 2-3 months ago when reaching over edge of curb with container in her hand; no fracture, but notable ecchymosis. Patient reports some balance issues resulting from medication.    Limitations Sitting;Standing;Walking    How long can you stand comfortably? <10 min    How long can you walk comfortably? 5 min    Diagnostic tests See below    Patient Stated Goals Able to walk on her trip in [place] next year    Pain Onset More than a month ago                   TREATMENT     Manual Therapy - for symptom modulation, soft tissue sensitivity and mobility, joint mobility, ROM    Patellar mobilization, grade III, all planes; x 5 min Anterior tibiofemoral mobilization, gr I-II Gentle R knee PROM within pt tolerance   *not today* TF distraction      Neuromuscular Re-education - for nervous system downregulation, quadriceps activation, and exercises to promote LE kinetic chain stability   NuStep, Level 2; x 5 minutes, seat at 8 and handlebars at 8 for nervous system downregulation to decrease   *next visit* Tandem standing; 2x30 sec each position     Therapeutic Exercise - for improved soft tissue flexibility and extensibility as needed for ROM, improved strength as needed  to improve performance of CKC activities/functional movements   Short arc quad; 2x10, 3 sec; 4-lb weight, bilat Straight leg raise; 1x10, bilat [verbal and tactile cueing for ROM and quadriceps contraction] Sidelying hip abduction; 2x10; bilat Standing heel raise with heel drop off edge of step for plantarflexor stretch; 2x10 Minisquat; light touch on edge of table; 1x10 [0-30 deg ROM only, fleeting pain with attempted half squat during this set]        *not today* Cold pack (unbilled) - for anti-inflammatory and analgesic effect as needed for reduced pain and improved  tolerance of activities in clinic, along R knee anteriorly in supine, with pillow under knee x 5 minutes         ASSESSMENT Patient exhibits improving knee ROM deficits and patellar mobility impairments. She does have ongoing pain with resting in terminal knee extension. Patient does have fleeting low back pain with SAQ, and she may be limited in some exercise progressions given Hx of chronic low back pain with sciatica and fibromyalgia. Patient tolerates other exercises well today and has relatively low level of pain this evening. Patient has remaining deficits in  hip and L quad strength, ankle dorsiflexion mobility, pain with accessing terminal knee extension and mild gait changes, and hyperalgesia of medial knee complex. Patient will benefit from continued skilled therapeutic intervention to address the above deficits as needed for improved function and QoL.        PT Short Term Goals - 12/31/20 1320       PT SHORT TERM GOAL #1   Title Pt will be independent with HEP in order to decrease knee pain and increase flexibility, mobility, and strength in order to improve pain-free function at home and work.    Baseline 12/31/20: Baseline HEP initiated    Time 2    Period Weeks    Status New    Target Date 01/14/21               PT Long Term Goals - 12/31/20 1319       PT LONG TERM GOAL #1   Title Patient will  demonstrate improved function as evidenced by a score of 60 on FOTO measure for full participation in activities at home and in the community.    Baseline 12/31/20: FOTO 52    Time 8    Period Weeks    Status New    Target Date 02/24/21      PT LONG TERM GOAL #2   Title Pt will decrease worst pain as reported on NPRS by at least 3 points in order to demonstrate clinically significant reduction in ankle/foot pain.    Baseline 12/31/20: Pain 10/10 at worst    Time 8    Period Weeks    Status New    Target Date 02/24/21      PT LONG TERM GOAL #3   Title Patient will tolerate standing and sitting up to 1 hour without increase in pain > 2/10 as needed for improved ability to complete household and leisure tasks and participate in family events    Baseline 12/31/20: Difficulty standing > 10 minutes, sitting > 15 mins    Time 8    Period Weeks    Status New    Target Date 02/24/21      PT LONG TERM GOAL #4   Title Patient will improve MMT to 4+/5 or greater for all tested LE musculature indicative of improved lower limb strength as needed for impoved ability to tolerate loading during prolonged standing activity and prolonged ambulation    Baseline 12/31/20: 4/5 hip flexion and abduction, 4+ R hip ER, 4- L quads.    Time 8    Period Weeks    Status New    Target Date 02/24/21                   Plan - 01/13/21 0800     Clinical Impression Statement Patient exhibits improving knee ROM deficits and patellar mobility impairments. She does have ongoing pain with resting in terminal knee extension. Patient  does have fleeting low back pain with SAQ, and she may be limited in some exercise progressions given Hx of chronic low back pain with sciatica and fibromyalgia. Patient tolerates other exercises well today and has relatively low level of pain this evening. Patient has remaining deficits in  hip and L quad strength, ankle dorsiflexion mobility, pain with accessing terminal knee  extension and mild gait changes, and hyperalgesia of medial knee complex. Patient will benefit from continued skilled therapeutic intervention to address the above deficits as needed for improved function and QoL.    Personal Factors and Comorbidities Age;Comorbidity 3+;Past/Current Experience    Comorbidities fibromyalgia, heart disease, emphysema, anxiety, hyperlipidemia, hypertension, OA, RA    Examination-Activity Limitations Locomotion Level;Sit;Stand;Stairs;Transfers    Examination-Participation Restrictions Community Activity;Shop    Stability/Clinical Decision Making Evolving/Moderate complexity    Rehab Potential Fair    PT Frequency 2x / week    PT Duration 8 weeks    PT Treatment/Interventions ADLs/Self Care Home Management;Cryotherapy;Electrical Stimulation;Moist Heat;Stair training;Functional mobility training;Therapeutic exercise;Balance training;Neuromuscular re-education;Manual techniques;Taping;Dry needling    PT Next Visit Plan Address posterior chain soft tissue mobility deficits, strategies for improving access to terminal knee extension, light OKC drills with gradual introduction of weightbearing exercise and functional movements. Progress HEP as needed.    PT Home Exercise Plan Access Code [email or web address]    [email or web address] and Agree with Plan of Care Patient             Patient will benefit from skilled therapeutic intervention in order to improve the following deficits and impairments:  Abnormal gait, Decreased strength, Pain, Hypomobility, Difficulty walking  Visit Diagnosis: Chronic pain of right knee  Muscle weakness (generalized)  Chronic pain syndrome     Problem List Patient Active Problem List   Diagnosis Date Noted   Effusion, right knee 12/15/2020   Primary osteoarthritis of right knee 11/24/2020   Overactive bladder 06/18/2020   Prediabetes 04/14/2020   Slow transit constipation 06/05/2019   Sciatica, left side 01/01/2019   Diverticulosis of colon  04/25/2018   Change in bowel habits    Tobacco use disorder, moderate, in sustained remission 12/22/2017   Moderate episode of recurrent major depressive disorder ([place]) 10/03/2017   Inflammatory spondylopathy of lumbar region ([place]) 10/03/2017   Hoarseness, persistent 04/18/2017   OSA on CPAP 02/18/2017   Cervical disc disease 12/31/2016   Generalized osteoarthritis 12/23/2016   Chronic pain syndrome 12/23/2016   Cystocele, midline 12/06/2016   Urinary incontinence 12/06/2016   Impingement syndrome of right shoulder 04/17/2015   Fibrocystic breast 01/18/2015   Gastroesophageal reflux disease without esophagitis 01/18/2015   Hypothyroidism, postablative 01/18/2015   Arteriosclerosis of coronary artery 12/29/2014   DJD of shoulder 11/13/2014   Fibromyalgia 10/02/2014   Intercostal neuralgia 10/02/2014   Facet syndrome, lumbar 10/02/2014   Sacroiliac joint disease 10/02/2014   Carotid artery narrowing 07/09/2014   Benign essential HTN 07/01/2014   Osteoarthritis of thumb 09/24/2012   Mixed hyperlipidemia 09/22/2012   Arthritis of hand, degenerative 09/22/2012    Valentina Gu, PT, DPT #[id number]  Eilleen Kempf, PT 01/13/2021, 8:00 AM   [facility] [address] [place], [place], [address] Phone: [phone]   Fax:  [phone]  Name: Carrie Myers MRN: [id number] Date of Birth: 11/07/1945

## 2021-01-13 ENCOUNTER — Encounter: Payer: Self-pay | Admitting: Physical Therapy

## 2021-01-14 ENCOUNTER — Ambulatory Visit: Payer: Medicare Other | Attending: Family Medicine | Admitting: Physical Therapy

## 2021-01-14 ENCOUNTER — Other Ambulatory Visit: Payer: Self-pay

## 2021-01-14 ENCOUNTER — Encounter: Payer: Self-pay | Admitting: Physical Therapy

## 2021-01-14 DIAGNOSIS — G894 Chronic pain syndrome: Secondary | ICD-10-CM | POA: Insufficient documentation

## 2021-01-14 DIAGNOSIS — G8929 Other chronic pain: Secondary | ICD-10-CM | POA: Diagnosis not present

## 2021-01-14 DIAGNOSIS — M25561 Pain in right knee: Secondary | ICD-10-CM | POA: Insufficient documentation

## 2021-01-14 DIAGNOSIS — M6281 Muscle weakness (generalized): Secondary | ICD-10-CM | POA: Insufficient documentation

## 2021-01-14 NOTE — Therapy (Signed)
[facility] [address] [place], [place], [address] Phone: [phone]   Fax:  [phone]  Physical Therapy Treatment  Patient Details  Name: Carrie Myers MRN: [id number] Date of Birth: [birth year] Referring Provider (PT): Rosette Reveal, MD   Encounter Date: 01/14/2021   PT End of Session - 01/17/21 0701     Visit Number 5    Number of Visits 17    Date for PT Re-Evaluation 02/25/21    Authorization Type Medicare, VL based on medical necessity    Progress Note Due on Visit 10    PT Start Time 1328    PT Stop Time 1412    PT Time Calculation (min) 44 min    Equipment Utilized During Treatment Gait belt    Activity Tolerance Patient limited by pain    Behavior During Therapy [facility] for tasks assessed/performed              Past Medical History:  Diagnosis Date   Allergy    Anxiety    Arthritis    neck, hands, lower back   Bilateral dry eyes    Chronic low back pain 12/23/2016   Coronary artery disease    DDD (degenerative disc disease), lumbar 10/02/2014   Dental crowns present    Implants, front "flipper" while waiting for other implants   Emphysema of lung ([place])    Fibromyalgia    Fibromyalgia    Fracture of right foot 01/14/2015   GERD (gastroesophageal reflux disease)    History of Graves' disease    Hyperlipidemia    Hypertension    Hypothyroidism    Myocardial infarction ([place]) 04/22/2006   Osteoarthritis    Other shoulder lesions, right shoulder 04/17/2015   Rheumatoid arthritis ([place])    Sciatica, left side 01/01/2019   Sleep apnea    CPAP   Status post shoulder surgery 05/27/2015   Thyroid disease    Vertigo    none for approx 9 yrs    Past Surgical History:  Procedure Laterality Date   ABDOMINAL HYSTERECTOMY     total   APPENDECTOMY     BREAST BIOPSY Right    neg   BREAST CYST ASPIRATION Left    neg   BUNIONECTOMY Bilateral    CESAREAN SECTION     COLONOSCOPY  2010   normal    COLONOSCOPY WITH PROPOFOL N/A 04/03/2018   Procedure: COLONOSCOPY WITH BIOPSIES;  Surgeon: Lucilla Lame, MD;  Location: [place];  Service: Endoscopy;  Laterality: N/A;  sleep apnea   CORONARY ANGIOPLASTY WITH STENT PLACEMENT  04/2006   dental implant     [age]   DILATION AND CURETTAGE OF UTERUS     epidural steroid injection  2018   ESOPHAGOGASTRODUODENOSCOPY N/A 10/03/2020   Procedure: ESOPHAGOGASTRODUODENOSCOPY (EGD);  Surgeon: Lesly Rubenstein, MD;  Location: [facility] ENDOSCOPY;  Service: Endoscopy;  Laterality: N/A;  REQUESTING MORNING AFTER 9 AM   EYE SURGERY     bilateral cataract extraction   SHOULDER ARTHROSCOPY Right 05/07/2015   Procedure: Extensive arthroscopic debridement and arthroscopic subacromial decompression, right shoulder.                        2.  Steroid injection right thumb CMC joint.;  Surgeon: Corky Mull, MD;  Location: [place];  Service: Orthopedics;  Laterality: Right;   TOOTH EXTRACTION     Upper centrals and lateral    There were no vitals filed  for this visit.   Subjective Assessment - 01/17/21 0700     Subjective Pt denies significant pain at arrival to PT in regard to her R knee pain. She reports that generalized pain related to fibromyalgia is present constantly. Patient is compliant with her HEP. Patient reports tolerating last sesson well.    Patient is accompained by: Family member    Pertinent History 75 year old female with R knee pain, Hx of effusion. Referring diagnosis of R knee osteoarthritis. 3 months ago, symptoms began with notable swelling when pt was leaving restaurant. Pt has been using nebulizer for comorbid emphysema. Patient followed up with physician for RLE edema to rule out DVT. Her symptoms improved with prednisone taper for about 3-4 weeks; she reports her symptoms returned after about 4-6 weeks. Pt had X-rays completed with Dr. Zigmund Daniel. Patient reports primary findings of knee effusion ("fluid") and primary  osteoarthritis. She states her knee is not as swollen presently. She has ongoing knee pain presently. Patient has upcoming trip in May 2023 in [place]. Hx of fibromyalgia, stage 1 emphysema (pt uses oxygen at night only, uses CPAP machine, inhaler prn), hx of MI 14 years ago (stint placed at the time, good cardio check-up since then). Pt reports most pain along medial aspect of R knee. Pt does have Hx of R ankle fracture with 13 weeks of immobilization/non-weightbearing. Pt reports notable stiffness with prolonged sitting; her symptoms are better when on the move. Pt reports improvement in R knee buckling following aspiration of joint. Pt had 1 fall about 2-3 months ago when reaching over edge of curb with container in her hand; no fracture, but notable ecchymosis. Patient reports some balance issues resulting from medication.    Limitations Sitting;Standing;Walking    How long can you stand comfortably? <10 min    How long can you walk comfortably? 5 min    Diagnostic tests See below    Patient Stated Goals Able to walk on her trip in [place] next year    Currently in Pain? No/denies    Pain Onset More than a month ago                  TREATMENT     Manual Therapy - for symptom modulation, soft tissue sensitivity and mobility, joint mobility, ROM    Patellar mobilization, grade III, all planes; x 5 min Anterior tibiofemoral mobilization, gr I-II Gentle R knee PROM within pt tolerance STM along R gastrocs and STM/IASTM with roller along hamstrings in hooklying position   *not today* TF distraction      Neuromuscular Re-education - for nervous system downregulation, quadriceps activation, and exercises to promote LE kinetic chain stability   NuStep, Level 2; x 5 minutes, seat at 8 and handlebars at 8 for nervous system downregulation to decrease   Tandem standing; 2x30 sec each position, standing at base of staircase with bilateral handrails adjacent as precaution for potential  LOB     Therapeutic Exercise - for improved soft tissue flexibility and extensibility as needed for ROM, improved strength as needed to improve performance of CKC activities/functional movements   Heel slide with yellow physioball; 20x Short arc quad; 2x10, 3 sec; 4-lb weight, bilat Straight leg raise; 1x12, 1x8 bilat Standing heel raise with heel drop off edge of step for plantarflexor stretch; 2x10  HEP update and review   *next visit* Standing terminal knee extension with Green Tband; 2x10, 3 sec   *not today* Sidelying hip abduction; 2x10; bilat Minisquat; light touch  on edge of table; 1x10 [0-30 deg ROM only, fleeting pain with attempted half squat during this set]      *not today* Cold pack (unbilled) - for anti-inflammatory and analgesic effect as needed for reduced pain and improved tolerance of activities in clinic, along R knee anteriorly in supine, with pillow under knee x 5 minutes         ASSESSMENT Patient is able to attain full end-range extension with use of manual therapy. Pt does still have intermittent pain with R knee lying flat on table. She exhibits improving posterior chain soft tissue extensibility as exhibited by good dorsiflexion ROM with calf stretch off of edge of step (utilized heel raise with heel drop, using eccentric phase of exercise for increased mobility) and ability to attain terminal knee extension with lower limb elevated in lying. She is gradually increasing volume and intensity of quadriceps and hip strengthening with successive visits, and has modestly improving subjective reports in spite of generalized pain that is always present secondary to fibromyalgia/chronic pain syndrome. Patient has remaining deficits in  hip and L quad strength, ankle dorsiflexion mobility, pain with accessing terminal knee extension and mild gait changes, and hyperalgesia of medial knee complex. Patient will benefit from continued skilled therapeutic intervention to  address the above deficits as needed for improved function and QoL.       PT Short Term Goals - 12/31/20 1320       PT SHORT TERM GOAL #1   Title Pt will be independent with HEP in order to decrease knee pain and increase flexibility, mobility, and strength in order to improve pain-free function at home and work.    Baseline 12/31/20: Baseline HEP initiated    Time 2    Period Weeks    Status New    Target Date 01/14/21               PT Long Term Goals - 12/31/20 1319       PT LONG TERM GOAL #1   Title Patient will demonstrate improved function as evidenced by a score of 60 on FOTO measure for full participation in activities at home and in the community.    Baseline 12/31/20: FOTO 52    Time 8    Period Weeks    Status New    Target Date 02/24/21      PT LONG TERM GOAL #2   Title Pt will decrease worst pain as reported on NPRS by at least 3 points in order to demonstrate clinically significant reduction in ankle/foot pain.    Baseline 12/31/20: Pain 10/10 at worst    Time 8    Period Weeks    Status New    Target Date 02/24/21      PT LONG TERM GOAL #3   Title Patient will tolerate standing and sitting up to 1 hour without increase in pain > 2/10 as needed for improved ability to complete household and leisure tasks and participate in family events    Baseline 12/31/20: Difficulty standing > 10 minutes, sitting > 15 mins    Time 8    Period Weeks    Status New    Target Date 02/24/21      PT LONG TERM GOAL #4   Title Patient will improve MMT to 4+/5 or greater for all tested LE musculature indicative of improved lower limb strength as needed for impoved ability to tolerate loading during prolonged standing activity and prolonged ambulation    Baseline  12/31/20: 4/5 hip flexion and abduction, 4+ R hip ER, 4- L quads.    Time 8    Period Weeks    Status New    Target Date 02/24/21                   Plan - 01/17/21 0706     Clinical Impression  Statement Patient is able to attain full end-range extension with use of manual therapy. Pt does still have intermittent pain with R knee lying flat on table. She exhibits improving posterior chain soft tissue extensibility as exhibited by good dorsiflexion ROM with calf stretch off of edge of step (utilized heel raise with heel drop, using eccentric phase of exercise for increased mobility) and ability to attain terminal knee extension with lower limb elevated in lying. She is gradually increasing volume and intensity of quadriceps and hip strengthening with successive visits, and has modestly improving subjective reports in spite of generalized pain that is always present secondary to fibromyalgia/chronic pain syndrome. Patient has remaining deficits in  hip and L quad strength, ankle dorsiflexion mobility, pain with accessing terminal knee extension and mild gait changes, and hyperalgesia of medial knee complex. Patient will benefit from continued skilled therapeutic intervention to address the above deficits as needed for improved function and QoL.    Personal Factors and Comorbidities Age;Comorbidity 3+;Past/Current Experience    Comorbidities fibromyalgia, heart disease, emphysema, anxiety, hyperlipidemia, hypertension, OA, RA    Examination-Activity Limitations Locomotion Level;Sit;Stand;Stairs;Transfers    Examination-Participation Restrictions Community Activity;Shop    Stability/Clinical Decision Making Evolving/Moderate complexity    Rehab Potential Fair    PT Frequency 2x / week    PT Duration 8 weeks    PT Treatment/Interventions ADLs/Self Care Home Management;Cryotherapy;Electrical Stimulation;Moist Heat;Stair training;Functional mobility training;Therapeutic exercise;Balance training;Neuromuscular re-education;Manual techniques;Taping;Dry needling    PT Next Visit Plan Address posterior chain soft tissue mobility deficits, strategies for improving access to terminal knee extension, light [place]  drills with gradual introduction of weightbearing exercise and functional movements. Progress HEP as needed.    PT Home Exercise Plan Access Code [email or web address]    Consulted and Agree with Plan of Care Patient             Patient will benefit from skilled therapeutic intervention in order to improve the following deficits and impairments:  Abnormal gait, Decreased strength, Pain, Hypomobility, Difficulty walking  Visit Diagnosis: Chronic pain of right knee  Muscle weakness (generalized)  Chronic pain syndrome     Problem List Patient Active Problem List   Diagnosis Date Noted   Effusion, right knee 12/15/2020   Primary osteoarthritis of right knee 11/24/2020   Overactive bladder 06/18/2020   Prediabetes 04/14/2020   Slow transit constipation 06/05/2019   Sciatica, left side 01/01/2019   Diverticulosis of colon 04/25/2018   Change in bowel habits    Tobacco use disorder, moderate, in sustained remission 12/22/2017   Moderate episode of recurrent major depressive disorder ([place]) 10/03/2017   Inflammatory spondylopathy of lumbar region ([place]) 10/03/2017   Hoarseness, persistent 04/18/2017   OSA on CPAP 02/18/2017   Cervical disc disease 12/31/2016   Generalized osteoarthritis 12/23/2016   Chronic pain syndrome 12/23/2016   Cystocele, midline 12/06/2016   Urinary incontinence 12/06/2016   Impingement syndrome of right shoulder 04/17/2015   Fibrocystic breast 01/18/2015   Gastroesophageal reflux disease without esophagitis 01/18/2015   Hypothyroidism, postablative 01/18/2015   Arteriosclerosis of coronary artery 12/29/2014   DJD of shoulder 11/13/2014   Fibromyalgia 10/02/2014   Intercostal neuralgia  10/02/2014   Facet syndrome, lumbar 10/02/2014   Sacroiliac joint disease 10/02/2014   Carotid artery narrowing 07/09/2014   Benign essential HTN 07/01/2014   Osteoarthritis of thumb 09/24/2012   Mixed hyperlipidemia 09/22/2012   Arthritis of hand, degenerative 09/22/2012    Valentina Gu, PT, DPT #[id number]  Eilleen Kempf, PT 01/17/2021, 7:06 AM  [facility] [address] [place], [place], [address] Phone: [phone]   Fax:  [phone]  Name: BRIANNIE GUTIERREZ MRN: [id number] Date of Birth: 23-Sep-1945

## 2021-01-19 ENCOUNTER — Encounter: Payer: Medicare Other | Admitting: Physical Therapy

## 2021-01-19 DIAGNOSIS — Z961 Presence of intraocular lens: Secondary | ICD-10-CM | POA: Diagnosis not present

## 2021-01-19 DIAGNOSIS — H18523 Epithelial (juvenile) corneal dystrophy, bilateral: Secondary | ICD-10-CM | POA: Diagnosis not present

## 2021-01-20 ENCOUNTER — Other Ambulatory Visit: Payer: Self-pay | Admitting: Family Medicine

## 2021-01-20 ENCOUNTER — Telehealth: Payer: Self-pay

## 2021-01-20 ENCOUNTER — Encounter: Payer: Medicare Other | Admitting: Physical Therapy

## 2021-01-20 DIAGNOSIS — M25562 Pain in left knee: Secondary | ICD-10-CM

## 2021-01-20 NOTE — Telephone Encounter (Signed)
Please advise for ongoing symptoms.

## 2021-01-20 NOTE — Telephone Encounter (Signed)
Spoke to patient today, mild improvement noted from having fluid drawn off and feels her leg is getting stronger with PT but knee pain persists. Discussed treatment options and she is amenable to coming in for evaluation of Zilretta injection. She also brings up left knee pain, is open to having this evaluated at that visit as well.  Please contact patient to schedule a ortho visit where we can evaluate for right knee Zilretta injection (and perform as indicated) as well as evaluate her left knee. X-rays placed in system for her to obtain ahead of time or at least 1 hour prior to visit.

## 2021-01-20 NOTE — Telephone Encounter (Signed)
Copied from [place] [phone]. Topic: General - Other >> Jan 19, 2021  4:34 PM Celene Kras wrote: Reason for CRM: Pt calling in regards to the gel shot and removed fluid on her knee that she received on 12/15/20. She states that she started PT as well and the pain is just as bad as when she came in. Pt is requesting to have Dr. Zigmund Daniel to give her a call back to go over next steps. Please advise .

## 2021-01-21 ENCOUNTER — Telehealth: Payer: Self-pay

## 2021-01-21 ENCOUNTER — Ambulatory Visit: Payer: Medicare Other | Admitting: Physical Therapy

## 2021-01-21 NOTE — Telephone Encounter (Signed)
Patient scheduled tomorrow for evaluation and possible Zilretta injection.  Will use sample if indicated.  For your information.

## 2021-01-21 NOTE — Telephone Encounter (Signed)
Patient needs to obtain X-Rays today or an hour before her appointment tomorrow.

## 2021-01-21 NOTE — Patient Instructions (Incomplete)
°  °  TREATMENT     Manual Therapy - for symptom modulation, soft tissue sensitivity and mobility, joint mobility, ROM    Patellar mobilization, grade III, all planes; x 5 min Anterior tibiofemoral mobilization, gr I-II Gentle R knee PROM within pt tolerance STM along R gastrocs and STM/IASTM with roller along hamstrings in hooklying position   *not today* TF distraction      Neuromuscular Re-education - for nervous system downregulation, quadriceps activation, and exercises to promote LE kinetic chain stability   NuStep, Level 2; x 5 minutes, seat at 8 and handlebars at 8 for nervous system downregulation to decrease    Tandem standing; 2x30 sec each position, standing at base of staircase with bilateral handrails adjacent as precaution for potential LOB     Therapeutic Exercise - for improved soft tissue flexibility and extensibility as needed for ROM, improved strength as needed to improve performance of CKC activities/functional movements   Heel slide with yellow physioball; 20x Short arc quad; 2x10, 3 sec; 4-lb weight, bilat Straight leg raise; 1x12, 1x8 bilat Standing heel raise with heel drop off edge of step for plantarflexor stretch; 2x10   HEP update and review     *next visit* Standing terminal knee extension with Green Tband; 2x10, 3 sec     *not today* Sidelying hip abduction; 2x10; bilat Minisquat; light touch on edge of table; 1x10 [0-30 deg ROM only, fleeting pain with attempted half squat during this set]        *not today* Cold pack (unbilled) - for anti-inflammatory and analgesic effect as needed for reduced pain and improved tolerance of activities in clinic, along R knee anteriorly in supine, with pillow under knee x 5 minutes         ASSESSMENT Patient is able to attain full end-range extension with use of manual therapy. Pt does still have intermittent pain with R knee lying flat on table. She exhibits improving posterior chain soft tissue  extensibility as exhibited by good dorsiflexion ROM with calf stretch off of edge of step (utilized heel raise with heel drop, using eccentric phase of exercise for increased mobility) and ability to attain terminal knee extension with lower limb elevated in lying. She is gradually increasing volume and intensity of quadriceps and hip strengthening with successive visits, and has modestly improving subjective reports in spite of generalized pain that is always present secondary to fibromyalgia/chronic pain syndrome. Patient has remaining deficits in  hip and L quad strength, ankle dorsiflexion mobility, pain with accessing terminal knee extension and mild gait changes, and hyperalgesia of medial knee complex. Patient will benefit from continued skilled therapeutic intervention to address the above deficits as needed for improved function and QoL.

## 2021-01-21 NOTE — Telephone Encounter (Signed)
Copied from [place] [phone]. Topic: Appointment Scheduling - Scheduling Inquiry for Clinic >> Jan 21, 2021  8:13 AM Carrie Myers wrote: Reason for CRM: Pt called in stating he spoke with Provider Zigmund Daniel about getting a Cortisone Shot in her knee, and to get that scheduled, I did reach out to the office and got the okay to schedule, so just wanting to give a head ups for anything else that may need to e done for her appt 11/10 @10 :40.

## 2021-01-22 ENCOUNTER — Ambulatory Visit: Payer: Medicare Other | Admitting: Anesthesiology

## 2021-01-22 ENCOUNTER — Ambulatory Visit
Admission: RE | Admit: 2021-01-22 | Discharge: 2021-01-22 | Disposition: A | Discharge status: Home / Self Care | Payer: Medicare Other | Attending: Family Medicine | Admitting: Family Medicine

## 2021-01-22 ENCOUNTER — Ambulatory Visit
Admission: RE | Admit: 2021-01-22 | Discharge: 2021-01-22 | Disposition: A | Discharge status: Home / Self Care | Payer: Medicare Other | Source: Ambulatory Visit | Attending: Family Medicine | Admitting: Family Medicine

## 2021-01-22 ENCOUNTER — Ambulatory Visit (INDEPENDENT_AMBULATORY_CARE_PROVIDER_SITE_OTHER): Payer: Medicare Other | Admitting: Family Medicine

## 2021-01-22 ENCOUNTER — Inpatient Hospital Stay (INDEPENDENT_AMBULATORY_CARE_PROVIDER_SITE_OTHER): Payer: Medicare Other | Admitting: Radiology

## 2021-01-22 ENCOUNTER — Other Ambulatory Visit: Payer: Self-pay

## 2021-01-22 ENCOUNTER — Encounter: Payer: Self-pay | Admitting: Family Medicine

## 2021-01-22 VITALS — BP 128/84 | HR 85 | Ht 68.0 in | Wt 179.0 lb

## 2021-01-22 DIAGNOSIS — M1711 Unilateral primary osteoarthritis, right knee: Secondary | ICD-10-CM

## 2021-01-22 DIAGNOSIS — M1712 Unilateral primary osteoarthritis, left knee: Secondary | ICD-10-CM | POA: Diagnosis not present

## 2021-01-22 DIAGNOSIS — M25562 Pain in left knee: Secondary | ICD-10-CM | POA: Insufficient documentation

## 2021-01-22 MED ORDER — MELOXICAM 7.5 MG PO TABS: 7.5000 mg | ORAL_TABLET | Freq: Every day | ORAL | 0 refills | Status: DC | PRN

## 2021-01-22 NOTE — Patient Instructions (Addendum)
You have just been given a Zilretta injection to reduce pain and inflammation. After the injection you may notice immediate relief of pain as a result of the Lidocaine. It is important to rest the area of the injection for 24 to 48 hours after the injection. There is a possibility of some temporary increased discomfort and swelling for up to 72 hours until the cortisone begins to work. If you do have pain, simply rest the joint and use ice. If you can tolerate over the counter medications, you can try Tylenol, Aleve, or Advil for added relief per package instructions. - Continue with physical therapy for right knee, start for left - Can use meloxicam once daily as-needed for knee pain - Can repeat injection in 3 months, contact ahead of time if needing this so we can schedule - Return in 2 months

## 2021-01-22 NOTE — Progress Notes (Signed)
Primary Care / Sports Medicine Office Visit  Patient Information:  Patient ID: Carrie Myers, female DOB: [birth year] Age: 75 y.o. MRN: [id number]   Carrie Myers is a pleasant 75 y.o. female presenting with the following:  Chief Complaint  Patient presents with   Primary osteoarthritis of right knee    5/10 pain   Knee Pain    Left; intermittent sharp pains for 6 months; X-Ray 01/22/21    Review of Systems pertinent details above   Patient Active Problem List   Diagnosis Date Noted   Primary osteoarthritis of left knee 01/22/2021   Effusion, right knee 12/15/2020   Primary osteoarthritis of right knee 11/24/2020   Overactive bladder 06/18/2020   Prediabetes 04/14/2020   Slow transit constipation 06/05/2019   Sciatica, left side 01/01/2019   Diverticulosis of colon 04/25/2018   Change in bowel habits    Tobacco use disorder, moderate, in sustained remission 12/22/2017   Moderate episode of recurrent major depressive disorder ([place]) 10/03/2017   Inflammatory spondylopathy of lumbar region ([place]) 10/03/2017   Hoarseness, persistent 04/18/2017   OSA on CPAP 02/18/2017   Cervical disc disease 12/31/2016   Generalized osteoarthritis 12/23/2016   Chronic pain syndrome 12/23/2016   Cystocele, midline 12/06/2016   Urinary incontinence 12/06/2016   Impingement syndrome of right shoulder 04/17/2015   Fibrocystic breast 01/18/2015   Gastroesophageal reflux disease without esophagitis 01/18/2015   Hypothyroidism, postablative 01/18/2015   Arteriosclerosis of coronary artery 12/29/2014   DJD of shoulder 11/13/2014   Fibromyalgia 10/02/2014   Intercostal neuralgia 10/02/2014   Facet syndrome, lumbar 10/02/2014   Sacroiliac joint disease 10/02/2014   Carotid artery narrowing 07/09/2014   Benign essential HTN 07/01/2014   Osteoarthritis of thumb 09/24/2012   Mixed hyperlipidemia 09/22/2012   Arthritis of hand, degenerative 09/22/2012   Past Medical History:   Diagnosis Date   Allergy    Anxiety    Arthritis    neck, hands, lower back   Bilateral dry eyes    Chronic low back pain 12/23/2016   Coronary artery disease    DDD (degenerative disc disease), lumbar 10/02/2014   Dental crowns present    Implants, front "flipper" while waiting for other implants   Emphysema of lung ([place])    Fibromyalgia    Fibromyalgia    Fracture of right foot 01/14/2015   GERD (gastroesophageal reflux disease)    History of Graves' disease    Hyperlipidemia    Hypertension    Hypothyroidism    Myocardial infarction ([place]) 04/22/2006   Osteoarthritis    Other shoulder lesions, right shoulder 04/17/2015   Rheumatoid arthritis ([place])    Sciatica, left side 01/01/2019   Sleep apnea    CPAP   Status post shoulder surgery 05/27/2015   Thyroid disease    Vertigo    none for approx 9 yrs   Outpatient Encounter Medications as of 01/22/2021  Medication Sig   Armodafinil 150 MG tablet Take 150 mg by mouth daily.   aspirin EC 81 MG tablet Take 81 mg by mouth daily.   citalopram (CELEXA) 20 MG tablet TAKE 1 TABLET BY MOUTH  DAILY   COMBIVENT RESPIMAT 20-100 MCG/ACT AERS respimat SMARTSIG:2 Inhalation Via Inhaler 4 Times Daily PRN   cyclobenzaprine (FLEXERIL) 10 MG tablet Take 10 mg by mouth 2 (two) times daily.   diazepam (VALIUM) 5 MG tablet Take 5 mg by mouth daily as needed.   Diclofenac-miSOPROStol 75-0.2 MG TBEC Take 1 tablet by mouth 2 (two)  times daily.   DULoxetine (CYMBALTA) 60 MG capsule TAKE 1 CAPSULE BY MOUTH  DAILY   esomeprazole (NEXIUM) 20 MG capsule Take 20 mg by mouth 2 (two) times daily before a meal.   fluocinonide (LIDEX) 0.05 % external solution Apply BID to affected areas on scalp PRN itching   hydrocortisone 2.5 % cream Apply topically 2 (two) times daily as needed ([place]).   ipratropium (ATROVENT) 0.06 % nasal spray Place into both nostrils.   ketoconazole (NIZORAL) 2 % shampoo 1-2 times a week lather on scalp/forehead, leave on 5-8  minutes, rinse well   levocetirizine (XYZAL) 5 MG tablet Take by mouth.   levothyroxine (SYNTHROID) 88 MCG tablet TAKE 1 TABLET BY MOUTH  DAILY   Loperamide HCl (IMODIUM PO) Take 1 tablet by mouth as needed.   losartan (COZAAR) 50 MG tablet Take 50 mg by mouth daily.   meloxicam (MOBIC) 7.5 MG tablet Take 1 tablet (7.5 mg total) by mouth daily as needed for pain.   mirabegron ER (MYRBETRIQ) 50 MG TB24 tablet Take 1 tablet (50 mg total) by mouth daily.   Multiple Vitamin (MULTI-VITAMINS) TABS Take by mouth.   pantoprazole (PROTONIX) 40 MG tablet Take 1 tablet by mouth 2 (two) times daily.   pimecrolimus (ELIDEL) 1 % cream Apply BID to affected areas on face/eyelids   pravastatin (PRAVACHOL) 80 MG tablet Take 80 mg by mouth daily.   prednisoLONE acetate (PRED FORTE) 1 % ophthalmic suspension Apply to eye.   pregabalin (LYRICA) 50 MG capsule Take 50 mg by mouth 2 (two) times daily.   tolterodine (DETROL LA) 4 MG 24 hr capsule TAKE 1 CAPSULE(4 MG) BY MOUTH DAILY   HYDROcodone-acetaminophen (NORCO/VICODIN) 5-325 MG tablet Take 1 tablet by mouth 2 (two) times daily as needed for moderate pain.   HYDROcodone-acetaminophen (NORCO/VICODIN) 5-325 MG tablet Take 1 tablet by mouth 2 (two) times daily.   [DISCONTINUED] meloxicam (MOBIC) 7.5 MG tablet Take 1 tablet (7.5 mg total) by mouth daily as needed for pain.   No facility-administered encounter medications on file as of 01/22/2021.   Past Surgical History:  Procedure Laterality Date   ABDOMINAL HYSTERECTOMY     total   APPENDECTOMY     BREAST BIOPSY Right    neg   BREAST CYST ASPIRATION Left    neg   BUNIONECTOMY Bilateral    CESAREAN SECTION     COLONOSCOPY  2010   normal   COLONOSCOPY WITH PROPOFOL N/A 04/03/2018   Procedure: COLONOSCOPY WITH BIOPSIES;  Surgeon: Lucilla Lame, MD;  Location: [facility];  Service: Endoscopy;  Laterality: N/A;  sleep apnea   CORONARY ANGIOPLASTY WITH STENT PLACEMENT  04/2006   dental implant      [age]   DILATION AND CURETTAGE OF UTERUS     epidural steroid injection  2018   ESOPHAGOGASTRODUODENOSCOPY N/A 10/03/2020   Procedure: ESOPHAGOGASTRODUODENOSCOPY (EGD);  Surgeon: Lesly Rubenstein, MD;  Location: [facility] ENDOSCOPY;  Service: Endoscopy;  Laterality: N/A;  REQUESTING MORNING AFTER 9 AM   EYE SURGERY     bilateral cataract extraction   SHOULDER ARTHROSCOPY Right 05/07/2015   Procedure: Extensive arthroscopic debridement and arthroscopic subacromial decompression, right shoulder.                        2.  Steroid injection right thumb CMC joint.;  Surgeon: Corky Mull, MD;  Location: [facility];  Service: Orthopedics;  Laterality: Right;   TOOTH EXTRACTION  Upper centrals and lateral    Vitals:   01/22/21 1117  BP: 128/84   Vitals:   01/22/21 1117  Weight: 179 lb (81.2 kg)  Height: 5\' 8"  (1.727 m)   Body mass index is 27.22 kg/m.  [place] LIMITED JOINT SPACE STRUCTURES LOW RIGHT  Result Date: 01/22/2021 Procedure:  Injection of right knee intra-articular under ultrasound guidance. Ultrasound guidance utilized for anterolateral out of plane approach, joint space was visualized with ultrasound, confirmation of injectate noted, ultrasound also utilized for evaluating presence of effusion, trace suprapatellar effusion noted Samsung HS60 device utilized with permanent recording / reporting. Consent obtained and verified. Skin prepped in a sterile fashion. Ethyl chloride spray for topical local analgesia. Completed without difficulty and tolerated well. Medication: triamcinolone acetonide extended-release injectable suspension 32 mg/mL. 32 mg total and 1 mL lidocaine 1% without epinephrine utilized for needle placement anesthetic Advised to contact for fevers/chills, erythema, induration, drainage, or persistent bleeding.    Independent interpretation of notes and tests performed by another provider:   Independent interpretation of left knee x-ray dated 01/22/2021  reveals mild medial tibiofemoral joint space narrowing, subtle lateral patellar facet osteophyte, no acute osseous process noted  Procedures performed:   Procedure:  Injection of right knee intra-articular under ultrasound guidance. Ultrasound guidance utilized for anterolateral out of plane approach, joint space was visualized with ultrasound, confirmation of injectate noted, ultrasound also utilized for evaluating presence of effusion, trace suprapatellar effusion noted Samsung HS60 device utilized with permanent recording / reporting. Consent obtained and verified. Skin prepped in a sterile fashion. Ethyl chloride spray for topical local analgesia.  Completed without difficulty and tolerated well. Medication: triamcinolone acetonide extended-release injectable suspension 32 mg/mL. 32 mg total and 1 mL lidocaine 1% without epinephrine utilized for needle placement anesthetic Advised to contact for fevers/chills, erythema, induration, drainage, or persistent bleeding.   Pertinent History, Exam, Impression, and Recommendations:   Primary osteoarthritis of right knee Patient tolerated ultrasound-guided intra-articular right knee Zilretta injection.  Post care reviewed, she will continue with physical therapy and follow-up in 2 months for this issue.  Primary osteoarthritis of left knee Chronic issue with recent exacerbation in the setting of concomitant right knee osteoarthritis treatment.  This can be considered secondary/compensatory pain given her less pronounced osteoarthritis findings on radiographs.  I have advised various treatment strategies and she will initiate meloxicam once daily on an as-needed basis, start physical therapy for this knee as well, and we can consider viscosupplementation and/or Zilretta in the future if pain prevents progression with PT.  We will follow-up on this issue in 2 months.    Orders & Medications Meds ordered this encounter  Medications   meloxicam  (MOBIC) 7.5 MG tablet    Sig: Take 1 tablet (7.5 mg total) by mouth daily as needed for pain.    Dispense:  30 tablet    Refill:  0   Orders Placed This Encounter  Procedures   [place] LIMITED JOINT SPACE STRUCTURES LOW RIGHT   Ambulatory referral to Physical Therapy     Return in about 2 months (around 03/24/2021).     Montel Culver, MD   Primary Care Sports Medicine [place]

## 2021-01-22 NOTE — Assessment & Plan Note (Signed)
Patient tolerated ultrasound-guided intra-articular right knee Zilretta injection.  Post care reviewed, she will continue with physical therapy and follow-up in 2 months for this issue.

## 2021-01-22 NOTE — Assessment & Plan Note (Signed)
Chronic issue with recent exacerbation in the setting of concomitant right knee osteoarthritis treatment.  This can be considered secondary/compensatory pain given her less pronounced osteoarthritis findings on radiographs.  I have advised various treatment strategies and she will initiate meloxicam once daily on an as-needed basis, start physical therapy for this knee as well, and we can consider viscosupplementation and/or Zilretta in the future if pain prevents progression with PT.  We will follow-up on this issue in 2 months.

## 2021-01-26 ENCOUNTER — Ambulatory Visit: Payer: Medicare Other | Admitting: Physical Therapy

## 2021-01-26 NOTE — Patient Instructions (Incomplete)
OBJECTIVE FINDINGS    Palpation Tenderness to palpation along medical femoral condyle and along MCL, 2+   Strength R/L 4/4 Hip flexion 4+*/5 Hip external rotation 4/5 Hip internal rotation 4/4 Hip abduction 5/5 Hip adduction 4+/4-* (cramp)  Knee extension 4+/4+ Knee flexion *indicates pain   AROM   Knee R/L Flexion: 135*/140 Extension: 0*/0 *indicates pain   Hip R/L Flexion: R WNL, L WNL Internal Rotation: R WNL, L WNL External Rotation: R WNL, L WNL *indicates pain   Ankle R/L 50/not tested Ankle Plantarflexion 8/not tested Ankle Dorsiflexion 35/not tested Ankle Inversion 25/not testedAnkle Eversion *Indicates Pain   Muscle Length Hamstring length: R lacking 20 degrees, L not tested           TREATMENT     Manual Therapy - for symptom modulation, soft tissue sensitivity and mobility, joint mobility, ROM    Patellar mobilization, grade III, all planes; x 5 min Anterior tibiofemoral mobilization, gr I-II Gentle R knee PROM within pt tolerance STM along R gastrocs and STM/IASTM with roller along hamstrings in hooklying position   *not today* TF distraction      Neuromuscular Re-education - for nervous system downregulation, quadriceps activation, and exercises to promote LE kinetic chain stability   NuStep, Level 2; x 5 minutes, seat at 8 and handlebars at 8 for nervous system downregulation to decrease    Tandem standing; 2x30 sec each position, standing at base of staircase with bilateral handrails adjacent as precaution for potential LOB     Therapeutic Exercise - for improved soft tissue flexibility and extensibility as needed for ROM, improved strength as needed to improve performance of CKC activities/functional movements   Heel slide with yellow physioball; 20x Short arc quad; 2x10, 3 sec; 4-lb weight, bilat Straight leg raise; 1x12, 1x8 bilat Standing heel raise with heel drop off edge of step for plantarflexor stretch; 2x10   HEP  update and review     *next visit* Standing terminal knee extension with Green Tband; 2x10, 3 sec     *not today* Sidelying hip abduction; 2x10; bilat Minisquat; light touch on edge of table; 1x10 [0-30 deg ROM only, fleeting pain with attempted half squat during this set]        *not today* Cold pack (unbilled) - for anti-inflammatory and analgesic effect as needed for reduced pain and improved tolerance of activities in clinic, along R knee anteriorly in supine, with pillow under knee x 5 minutes         ASSESSMENT Patient is able to attain full end-range extension with use of manual therapy. Pt does still have intermittent pain with R knee lying flat on table. She exhibits improving posterior chain soft tissue extensibility as exhibited by good dorsiflexion ROM with calf stretch off of edge of step (utilized heel raise with heel drop, using eccentric phase of exercise for increased mobility) and ability to attain terminal knee extension with lower limb elevated in lying. She is gradually increasing volume and intensity of quadriceps and hip strengthening with successive visits, and has modestly improving subjective reports in spite of generalized pain that is always present secondary to fibromyalgia/chronic pain syndrome. Patient has remaining deficits in  hip and L quad strength, ankle dorsiflexion mobility, pain with accessing terminal knee extension and mild gait changes, and hyperalgesia of medial knee complex. Patient will benefit from continued skilled therapeutic intervention to address the above deficits as needed for improved function and QoL.

## 2021-01-28 ENCOUNTER — Other Ambulatory Visit: Payer: Self-pay

## 2021-01-28 ENCOUNTER — Ambulatory Visit: Payer: Medicare Other | Admitting: Physical Therapy

## 2021-01-28 DIAGNOSIS — G8929 Other chronic pain: Secondary | ICD-10-CM | POA: Diagnosis not present

## 2021-01-28 DIAGNOSIS — G894 Chronic pain syndrome: Secondary | ICD-10-CM

## 2021-01-28 DIAGNOSIS — M6281 Muscle weakness (generalized): Secondary | ICD-10-CM

## 2021-01-28 DIAGNOSIS — M25561 Pain in right knee: Secondary | ICD-10-CM | POA: Diagnosis not present

## 2021-01-28 NOTE — Therapy (Signed)
[facility] [address] [place], [place], [address] Phone: [phone]   Fax:  [phone]  Physical Therapy Treatment  Patient Details  Name: Carrie Myers MRN: [id number] Date of Birth: 1945/03/25 Referring Provider (PT): Rosette Reveal, MD   Encounter Date: 01/28/2021   PT End of Session - 01/30/21 1017     Visit Number 6    Number of Visits 17    Date for PT Re-Evaluation 02/25/21    Authorization Type Medicare, VL based on medical necessity    Progress Note Due on Visit 10    PT Start Time 1101    PT Stop Time 1145    PT Time Calculation (min) 44 min    Equipment Utilized During Treatment Gait belt    Activity Tolerance Patient limited by pain    Behavior During Therapy [facility] for tasks assessed/performed             Past Medical History:  Diagnosis Date   Allergy    Anxiety    Arthritis    neck, hands, lower back   Bilateral dry eyes    Chronic low back pain 12/23/2016   Coronary artery disease    DDD (degenerative disc disease), lumbar 10/02/2014   Dental crowns present    Implants, front "flipper" while waiting for other implants   Emphysema of lung ([place])    Fibromyalgia    Fibromyalgia    Fracture of right foot 01/14/2015   GERD (gastroesophageal reflux disease)    History of Graves' disease    Hyperlipidemia    Hypertension    Hypothyroidism    Myocardial infarction ([place]) 04/22/2006   Osteoarthritis    Other shoulder lesions, right shoulder 04/17/2015   Rheumatoid arthritis ([place])    Sciatica, left side 01/01/2019   Sleep apnea    CPAP   Status post shoulder surgery 05/27/2015   Thyroid disease    Vertigo    none for approx 9 yrs    Past Surgical History:  Procedure Laterality Date   ABDOMINAL HYSTERECTOMY     total   APPENDECTOMY     BREAST BIOPSY Right    neg   BREAST CYST ASPIRATION Left    neg   BUNIONECTOMY Bilateral    CESAREAN SECTION     COLONOSCOPY  2010   normal    COLONOSCOPY WITH PROPOFOL N/A 04/03/2018   Procedure: COLONOSCOPY WITH BIOPSIES;  Surgeon: Lucilla Lame, MD;  Location: [facility];  Service: Endoscopy;  Laterality: N/A;  sleep apnea   CORONARY ANGIOPLASTY WITH STENT PLACEMENT  04/2006   dental implant     [age]   DILATION AND CURETTAGE OF UTERUS     epidural steroid injection  2018   ESOPHAGOGASTRODUODENOSCOPY N/A 10/03/2020   Procedure: ESOPHAGOGASTRODUODENOSCOPY (EGD);  Surgeon: Lesly Rubenstein, MD;  Location: [facility] ENDOSCOPY;  Service: Endoscopy;  Laterality: N/A;  REQUESTING MORNING AFTER 9 AM   EYE SURGERY     bilateral cataract extraction   SHOULDER ARTHROSCOPY Right 05/07/2015   Procedure: Extensive arthroscopic debridement and arthroscopic subacromial decompression, right shoulder.                        2.  Steroid injection right thumb CMC joint.;  Surgeon: Corky Mull, MD;  Location: [facility];  Service: Orthopedics;  Laterality: Right;   TOOTH EXTRACTION     Upper centrals and lateral    There were no vitals filed for  this visit.   Subjective Assessment - 01/30/21 1017     Subjective Patient had recent cortisone injection in R knee 11/10 that she feels has notably reduced her pain. She reports no change with viscosupplementation injection. Patient reports that her L knee is not bothering her today. Patient reports intermittent non-compliane with her HEP. She reports that her L knee does bother her intermittently - sharp anterior knee pain along patellar body region - intermittent L knee buckling. Patient reports no recent paresthesias or numbness affecting L lower limb. Pt's referring physician added new order for L knee.    Patient is accompained by: Family member    Pertinent History 75 year old female with R knee pain, Hx of effusion. Referring diagnosis of R knee osteoarthritis. 3 months ago, symptoms began with notable swelling when pt was leaving restaurant. Pt has been using nebulizer for comorbid  emphysema. Patient followed up with physician for RLE edema to rule out DVT. Her symptoms improved with prednisone taper for about 3-4 weeks; she reports her symptoms returned after about 4-6 weeks. Pt had X-rays completed with Dr. Zigmund Daniel. Patient reports primary findings of knee effusion ("fluid") and primary osteoarthritis. She states her knee is not as swollen presently. She has ongoing knee pain presently. Patient has upcoming trip in May 2023 in [place]. Hx of fibromyalgia, stage 1 emphysema (pt uses oxygen at night only, uses CPAP machine, inhaler prn), hx of MI 14 years ago (stint placed at the time, good cardio check-up since then). Pt reports most pain along medial aspect of R knee. Pt does have Hx of R ankle fracture with 13 weeks of immobilization/non-weightbearing. Pt reports notable stiffness with prolonged sitting; her symptoms are better when on the move. Pt reports improvement in R knee buckling following aspiration of joint. Pt had 1 fall about 2-3 months ago when reaching over edge of curb with container in her hand; no fracture, but notable ecchymosis. Patient reports some balance issues resulting from medication.    Limitations Sitting;Standing;Walking    How long can you stand comfortably? <10 min    How long can you walk comfortably? 5 min    Diagnostic tests See below    Patient Stated Goals Able to walk on her trip in [place] next year    Pain Onset More than a month ago             OBJECTIVE FINDINGS  AROM   Knee R/L Flexion: 137/138 Extension: +1*/+2 *indicates pain   Hip R/L Flexion: R WNL, L WNL* Internal Rotation: R WNL, L 30* External Rotation: R WNL, L WNL* *indicates pain [No concordant sign with hip ROM, pain along groin and lateral thigh with L hip flexion, IR, ER]   Ankle R/L 50/50 Ankle Plantarflexion 7/9 Ankle Dorsiflexion 35/35 Ankle Inversion 25/25 Ankle Eversion *Indicates Pain   Muscle Length Hamstring length: R lacking 20 degrees, L  lacking 20 degrees       TREATMENT     Therapeutic Activities  Additional assessment performed for adding L knee referral to POC (see above)    Manual Therapy - for symptom modulation, soft tissue sensitivity and mobility, joint mobility, ROM    Patellar mobilization, grade III, all planes; x 5 min R anterior tibiofemoral mobilization, gr I-II Gentle R knee PROM within pt tolerance    *not today* STM along R gastrocs and STM/IASTM with roller along hamstrings in hooklying position TF distraction      Neuromuscular Re-education - for nervous system downregulation, quadriceps activation,  and exercises to promote LE kinetic chain stability   NuStep, Level 2; x 5 minutes, seat at 8 and handlebars at 8 for nervous system down-regulation and to decrease central sensitivity    *next visit* Tandem standing; 2x30 sec each position, standing at base of staircase with bilateral handrails adjacent as precaution for potential LOB     Therapeutic Exercise - for improved soft tissue flexibility and extensibility as needed for ROM, improved strength as needed to improve performance of CKC activities/functional movements   Standing terminal knee extension with Green Tband; 2x10, 3 sec Straight leg raise; 2x10 bilateral    *next visit* Standing heel raise with heel drop off edge of step for plantarflexor stretch; 2x10      *not today* Short arc quad; 2x10, 3 sec; 4-lb weight, bilat Heel slide with yellow physioball; 20x Sidelying hip abduction; 2x10; bilat Minisquat; light touch on edge of table; 1x10 [0-30 deg ROM only, fleeting pain with attempted half squat during this set]        *not today* Cold pack (unbilled) - for anti-inflammatory and analgesic effect as needed for reduced pain and improved tolerance of activities in clinic, along R knee anteriorly in supine, with pillow under knee x 5 minutes         ASSESSMENT Patient had new order for PT for L knee since last  follow-up with physical therapy. Completed brief additional assessment today to supplement tests and measures obtained at IE given new order. Patient has minimal L knee pain at this time and has responded well with recent R knee injections. She does have pain intermittently with R knee terminal extension that is improved with manaul therapy. Standing TKE with Theraband is performed at end of session without increase in knee pain. Pt does have continuous generalized pain related to fibromyalgia; we have discussed strategies for decreasing central sensitivity related to this disorder. Patient has remaining deficits in  hip and L quad strength, bilateral ankle dorsiflexion mobility, decreased LE posterior chain soft tissue flexibility bilaterally, pain with accessing terminal knee extension in R knee, mild gait changes, and hyperalgesia of R>L medial knee complex. Patient will benefit from continued skilled therapeutic intervention to address the above deficits as needed for improved function and QoL.        PT Short Term Goals - 12/31/20 1320       PT SHORT TERM GOAL #1   Title Pt will be independent with HEP in order to decrease knee pain and increase flexibility, mobility, and strength in order to improve pain-free function at home and work.    Baseline 12/31/20: Baseline HEP initiated    Time 2    Period Weeks    Status New    Target Date 01/14/21               PT Long Term Goals - 12/31/20 1319       PT LONG TERM GOAL #1   Title Patient will demonstrate improved function as evidenced by a score of 60 on FOTO measure for full participation in activities at home and in the community.    Baseline 12/31/20: FOTO 52    Time 8    Period Weeks    Status New    Target Date 02/24/21      PT LONG TERM GOAL #2   Title Pt will decrease worst pain as reported on NPRS by at least 3 points in order to demonstrate clinically significant reduction in ankle/foot pain.  Baseline 12/31/20: Pain  10/10 at worst    Time 8    Period Weeks    Status New    Target Date 02/24/21      PT LONG TERM GOAL #3   Title Patient will tolerate standing and sitting up to 1 hour without increase in pain > 2/10 as needed for improved ability to complete household and leisure tasks and participate in family events    Baseline 12/31/20: Difficulty standing > 10 minutes, sitting > 15 mins    Time 8    Period Weeks    Status New    Target Date 02/24/21      PT LONG TERM GOAL #4   Title Patient will improve MMT to 4+/5 or greater for all tested LE musculature indicative of improved lower limb strength as needed for impoved ability to tolerate loading during prolonged standing activity and prolonged ambulation    Baseline 12/31/20: 4/5 hip flexion and abduction, 4+ R hip ER, 4- L quads.    Time 8    Period Weeks    Status New    Target Date 02/24/21                   Plan - 01/30/21 1030     Clinical Impression Statement Patient had new order for PT for L knee since last follow-up with physical therapy. Completed brief additional assessment today to supplement tests and measures obtained at IE given new order. Patient has minimal L knee pain at this time and has responded well with recent R knee injections. She does have pain intermittently with R knee terminal extension that is improved with manaul therapy. Standing TKE with Theraband is performed at end of session without increase in knee pain. Pt does have continuous generalized pain related to fibromyalgia; we have discussed strategies for decreasing central sensitivity related to this disorder. Patient has remaining deficits in  hip and L quad strength, bilateral ankle dorsiflexion mobility, decreased LE posterior chain soft tissue flexibility bilaterally, pain with accessing terminal knee extension in R knee, mild gait changes, and hyperalgesia of R>L medial knee complex. Patient will benefit from continued skilled therapeutic intervention  to address the above deficits as needed for improved function and QoL.    Personal Factors and Comorbidities Age;Comorbidity 3+;Past/Current Experience    Comorbidities fibromyalgia, heart disease, emphysema, anxiety, hyperlipidemia, hypertension, OA, RA    Examination-Activity Limitations Locomotion Level;Sit;Stand;Stairs;Transfers    Examination-Participation Restrictions Community Activity;Shop    Stability/Clinical Decision Making Evolving/Moderate complexity    Rehab Potential Fair    PT Frequency 2x / week    PT Duration 8 weeks    PT Treatment/Interventions ADLs/Self Care Home Management;Cryotherapy;Electrical Stimulation;Moist Heat;Stair training;Functional mobility training;Therapeutic exercise;Balance training;Neuromuscular re-education;Manual techniques;Taping;Dry needling    PT Next Visit Plan Address posterior chain soft tissue mobility deficits, strategies for improving access to terminal knee extension, light OKC drills with gradual introduction of weightbearing exercise and functional movements. Progress HEP as needed.    PT Home Exercise Plan Access Code [email or web address]    Consulted and Agree with Plan of Care Patient             Patient will benefit from skilled therapeutic intervention in order to improve the following deficits and impairments:  Abnormal gait, Decreased strength, Pain, Hypomobility, Difficulty walking  Visit Diagnosis: Chronic pain of right knee  Muscle weakness (generalized)  Chronic pain syndrome     Problem List Patient Active Problem List   Diagnosis Date Noted  Primary osteoarthritis of left knee 01/22/2021   Effusion, right knee 12/15/2020   Primary osteoarthritis of right knee 11/24/2020   Overactive bladder 06/18/2020   Prediabetes 04/14/2020   Slow transit constipation 06/05/2019   Sciatica, left side 01/01/2019   Diverticulosis of colon 04/25/2018   Change in bowel habits    Tobacco use disorder, moderate, in sustained remission  12/22/2017   Moderate episode of recurrent major depressive disorder ([place]) 10/03/2017   Inflammatory spondylopathy of lumbar region ([place]) 10/03/2017   Hoarseness, persistent 04/18/2017   OSA on CPAP 02/18/2017   Cervical disc disease 12/31/2016   Generalized osteoarthritis 12/23/2016   Chronic pain syndrome 12/23/2016   Cystocele, midline 12/06/2016   Urinary incontinence 12/06/2016   Impingement syndrome of right shoulder 04/17/2015   Fibrocystic breast 01/18/2015   Gastroesophageal reflux disease without esophagitis 01/18/2015   Hypothyroidism, postablative 01/18/2015   Arteriosclerosis of coronary artery 12/29/2014   DJD of shoulder 11/13/2014   Fibromyalgia 10/02/2014   Intercostal neuralgia 10/02/2014   Facet syndrome, lumbar 10/02/2014   Sacroiliac joint disease 10/02/2014   Carotid artery narrowing 07/09/2014   Benign essential HTN 07/01/2014   Osteoarthritis of thumb 09/24/2012   Mixed hyperlipidemia 09/22/2012   Arthritis of hand, degenerative 09/22/2012   Valentina Gu, PT, DPT #[id number]  Eilleen Kempf, PT 01/30/2021, 10:30 AM  [facility] [address] [place], [place], [address] Phone: [phone]   Fax:  [phone]  Name: Carrie Myers MRN: [id number] Date of Birth: 06-21-1945

## 2021-01-30 ENCOUNTER — Encounter: Payer: Self-pay | Admitting: Physical Therapy

## 2021-02-02 ENCOUNTER — Other Ambulatory Visit: Payer: Self-pay

## 2021-02-02 ENCOUNTER — Ambulatory Visit: Payer: Medicare Other | Admitting: Physical Therapy

## 2021-02-02 DIAGNOSIS — G8929 Other chronic pain: Secondary | ICD-10-CM | POA: Diagnosis not present

## 2021-02-02 DIAGNOSIS — G894 Chronic pain syndrome: Secondary | ICD-10-CM | POA: Diagnosis not present

## 2021-02-02 DIAGNOSIS — M25561 Pain in right knee: Secondary | ICD-10-CM | POA: Diagnosis not present

## 2021-02-02 DIAGNOSIS — M6281 Muscle weakness (generalized): Secondary | ICD-10-CM | POA: Diagnosis not present

## 2021-02-02 NOTE — Therapy (Signed)
[facility] [address] [place], [place], [address] Phone: [phone]   Fax:  [phone]  Physical Therapy Treatment  Patient Details  Name: Carrie Myers MRN: [id number] Date of Birth: 13-Jul-1945 Referring Provider (PT): Rosette Reveal, MD   Encounter Date: 02/02/2021   PT End of Session - 02/02/21 1107     Visit Number 7    Number of Visits 17    Date for PT Re-Evaluation 02/25/21    Authorization Type Medicare, VL based on medical necessity    Progress Note Due on Visit 10    PT Start Time 1103    PT Stop Time 1145    PT Time Calculation (min) 42 min    Equipment Utilized During Treatment Gait belt    Activity Tolerance Patient limited by pain    Behavior During Therapy WFL for tasks assessed/performed             Past Medical History:  Diagnosis Date   Allergy    Anxiety    Arthritis    neck, hands, lower back   Bilateral dry eyes    Chronic low back pain 12/23/2016   Coronary artery disease    DDD (degenerative disc disease), lumbar 10/02/2014   Dental crowns present    Implants, front "flipper" while waiting for other implants   Emphysema of lung ([place])    Fibromyalgia    Fibromyalgia    Fracture of right foot 01/14/2015   GERD (gastroesophageal reflux disease)    History of Graves' disease    Hyperlipidemia    Hypertension    Hypothyroidism    Myocardial infarction ([place]) 04/22/2006   Osteoarthritis    Other shoulder lesions, right shoulder 04/17/2015   Rheumatoid arthritis ([place])    Sciatica, left side 01/01/2019   Sleep apnea    CPAP   Status post shoulder surgery 05/27/2015   Thyroid disease    Vertigo    none for approx 9 yrs    Past Surgical History:  Procedure Laterality Date   ABDOMINAL HYSTERECTOMY     total   APPENDECTOMY     BREAST BIOPSY Right    neg   BREAST CYST ASPIRATION Left    neg   BUNIONECTOMY Bilateral    CESAREAN SECTION     COLONOSCOPY  2010   normal    COLONOSCOPY WITH PROPOFOL N/A 04/03/2018   Procedure: COLONOSCOPY WITH BIOPSIES;  Surgeon: Lucilla Lame, MD;  Location: [facility];  Service: Endoscopy;  Laterality: N/A;  sleep apnea   CORONARY ANGIOPLASTY WITH STENT PLACEMENT  04/2006   dental implant     [age]   DILATION AND CURETTAGE OF UTERUS     epidural steroid injection  2018   ESOPHAGOGASTRODUODENOSCOPY N/A 10/03/2020   Procedure: ESOPHAGOGASTRODUODENOSCOPY (EGD);  Surgeon: Lesly Rubenstein, MD;  Location: [facility] END[facility]COPY;  Service: Endoscopy;  Laterality: N/A;  REQUESTING MORNING AFTER 9 AM   EYE SURGERY     bilateral cataract extraction   SHOULDER ARTHROSCOPY Right 05/07/2015   Procedure: Extensive arthroscopic debridement and arthroscopic subacromial decompression, right shoulder.                        2.  Steroid injection right thumb CMC joint.;  Surgeon: Corky Mull, MD;  Location: [facility];  Service: Orthopedics;  Laterality: Right;   TOOTH EXTRACTION     Upper centrals and lateral    There were no vitals filed for  this visit.   Subjective Assessment - 02/02/21 1106     Subjective Patient is s/p cortisone injection in R knee 01/22/21. She denies knee pain on either side at arrival today. She reports sciatic-type pain along left lower limb today. Pt reports 3/10 radiating sciatic-type pain. She reports pain along L gluteal region and down her left lower limb to L posterior/lateral thigh and down to L lateral ankle. Pt reports pulsating pain along L calf the previous evening that improved with moist heat. Pt has Hx of good reponse to lumbar spine injections to treat her sciatica.    Patient is accompained by: Family member    Pertinent History 75 year old female with R knee pain, Hx of effusion. Referring diagnosis of R knee osteoarthritis. 3 months ago, symptoms began with notable swelling when pt was leaving restaurant. Pt has been using nebulizer for comorbid emphysema. Patient followed up with physician  for RLE edema to rule out DVT. Her symptoms improved with prednisone taper for about 3-4 weeks; she reports her symptoms returned after about 4-6 weeks. Pt had X-rays completed with Dr. Zigmund Daniel. Patient reports primary findings of knee effusion ("fluid") and primary osteoarthritis. She states her knee is not as swollen presently. She has ongoing knee pain presently. Patient has upcoming trip in May 2023 in [place]. Hx of fibromyalgia, stage 1 emphysema (pt uses oxygen at night only, uses CPAP machine, inhaler prn), hx of MI 14 years ago (stint placed at the time, good cardio check-up since then). Pt reports most pain along medial aspect of R knee. Pt does have Hx of R ankle fracture with 13 weeks of immobilization/non-weightbearing. Pt reports notable stiffness with prolonged sitting; her symptoms are better when on the move. Pt reports improvement in R knee buckling following aspiration of joint. Pt had 1 fall about 2-3 months ago when reaching over edge of curb with container in her hand; no fracture, but notable ecchymosis. Patient reports some balance issues resulting from medication.    Limitations Sitting;Standing;Walking    How long can you stand comfortably? <10 min    How long can you walk comfortably? 5 min    Diagnostic tests See below    Patient Stated Goals Able to walk on her trip in [place] next year    Pain Onset More than a month ago             OBJECTIVE FINDINGS  Palpation Taut and tender L deep hip ER 2+, gluteus maximus 3+ No calf tenderness  Special Testing Homan's sign: R Positive for pain, L Positive for pain SLR: R Negative, L Positive  Observation No color change, pitting edema, venous distension, or increased girth in either calf   No change in sciatic-type pain with long-leg distraction      TREATMENT     Manual Therapy - for symptom modulation, soft tissue sensitivity and mobility, joint mobility, ROM    (Bilateral) Patellar mobilization, grade III,  all planes; x 2 min ea R anterior tibiofemoral mobilization, gr I-II Gentle bilat knee PROM within pt tolerance  STM and IASTM with Hypervolt along L gluteal mm in R sidelying     *not today* STM along R gastrocs and STM/IASTM with roller along hamstrings in hooklying position TF distraction       Neuromuscular Re-education - for nervous system downregulation, quadriceps activation, and exercises to promote LE kinetic chain stability   NuStep, Level 2; x 5 minutes, seat at 8 and handlebars at 8 for nervous system down-regulation and  to decrease central sensitivity   Tandem standing; 1x30 sec each position, standing at base of staircase with bilateral handrails adjacent as precaution for potential LOB     Therapeutic Exercise - for improved soft tissue flexibility and extensibility as needed for ROM, improved strength as needed to improve performance of CKC activities/functional movements   Piriformis stretch, figure-4 position in hooklying; 2x30 sec [reviewed from previous episode of care]  Straight leg raise; 1x10 bilateral  Patient education: discussed low index of suspicion for DVT in context of calf pain, S&S for which to monitor given Hx of significant calf pain. Brief testing and review of previous stretches given in PT      *not today* Standing terminal knee extension with Green Tband; 2x10, 3 sec Standing heel raise with heel drop off edge of step for plantarflexor stretch; 2x10 Short arc quad; 2x10, 3 sec; 4-lb weight, bilat Heel slide with yellow physioball; 20x Sidelying hip abduction; 2x10; bilat Minisquat; light touch on edge of table; 1x10 [0-30 deg ROM only, fleeting pain with attempted half squat during this set]       MHP (unbilled) utilized during manual therapy for analgesic effect and improved soft tissue extensibility, x 5 minutes; along L hip in R sidelying         ASSESSMENT Patient has comorbid L lower limb sciatic-type symptoms with positive  straight leg raise on left. Pt will be following up with physician regarding medical management of this. Pt did undergo previous episode of care related to low back pain and lower limb referred symptoms and fibromyalgia management. Pt is able to control generalized pain/fibromyalgia symptoms well on most days, but she does have intermittent flare-ups that severely limit physical activity. Pt has no notable knee mobility deficits in either LE today and has minimal knee pain overall. Patient does have taut and tender gluteus medius and deep hip external rotators with moderate reproduction of symptoms; reviewed previously given piriformis stretching and agreed with patient's plan for following up with MD. Pt had significant calf pain last night - no concerning signs for DVT at this time. We reviewed serious signs that would necessitate immediate medical attention. Patient has remaining deficits in  hip and L quad strength, bilateral ankle dorsiflexion mobility, decreased LE posterior chain soft tissue flexibility bilaterally, pain with accessing terminal knee extension in R knee, mild gait changes, and hyperalgesia of R>L medial knee complex. Patient will benefit from continued skilled therapeutic intervention to address the above deficits as needed for improved function and QoL.       PT Short Term Goals - 12/31/20 1320       PT SHORT TERM GOAL #1   Title Pt will be independent with HEP in order to decrease knee pain and increase flexibility, mobility, and strength in order to improve pain-free function at home and work.    Baseline 12/31/20: Baseline HEP initiated    Time 2    Period Weeks    Status New    Target Date 01/14/21               PT Long Term Goals - 12/31/20 1319       PT LONG TERM GOAL #1   Title Patient will demonstrate improved function as evidenced by a score of 60 on FOTO measure for full participation in activities at home and in the community.    Baseline 12/31/20: FOTO  52    Time 8    Period Weeks    Status New  Target Date 02/24/21      PT LONG TERM GOAL #2   Title Pt will decrease worst pain as reported on NPRS by at least 3 points in order to demonstrate clinically significant reduction in ankle/foot pain.    Baseline 12/31/20: Pain 10/10 at worst    Time 8    Period Weeks    Status New    Target Date 02/24/21      PT LONG TERM GOAL #3   Title Patient will tolerate standing and sitting up to 1 hour without increase in pain > 2/10 as needed for improved ability to complete household and leisure tasks and participate in family events    Baseline 12/31/20: Difficulty standing > 10 minutes, sitting > 15 mins    Time 8    Period Weeks    Status New    Target Date 02/24/21      PT LONG TERM GOAL #4   Title Patient will improve MMT to 4+/5 or greater for all tested LE musculature indicative of improved lower limb strength as needed for impoved ability to tolerate loading during prolonged standing activity and prolonged ambulation    Baseline 12/31/20: 4/5 hip flexion and abduction, 4+ R hip ER, 4- L quads.    Time 8    Period Weeks    Status New    Target Date 02/24/21                   Plan - 02/03/21 1103     Clinical Impression Statement Patient has comorbid L lower limb sciatic-type symptoms with positive straight leg raise on left. Pt will be following up with physician regarding medical management of this. Pt did undergo previous episode of care related to low back pain and lower limb referred symptoms and fibromyalgia management. Pt is able to control generalized pain/fibromyalgia symptoms well on most days, but she does have intermittent flare-ups that severely limit physical activity. Pt has no notable knee mobility deficits in either LE today and has minimal knee pain overall. Patient does have taut and tender gluteus medius and deep hip external rotators with moderate reproduction of symptoms; reviewed previously given piriformis  stretching and agreed with patient's plan for following up with MD. Pt had significant calf pain last night - no concerning signs for DVT at this time. We reviewed serious signs that would necessitate immediate medical attention. Patient has remaining deficits in  hip and L quad strength, bilateral ankle dorsiflexion mobility, decreased LE posterior chain soft tissue flexibility bilaterally, pain with accessing terminal knee extension in R knee, mild gait changes, and hyperalgesia of R>L medial knee complex. Patient will benefit from continued skilled therapeutic intervention to address the above deficits as needed for improved function and QoL.    Personal Factors and Comorbidities Age;Comorbidity 3+;Past/Current Experience    Comorbidities fibromyalgia, heart disease, emphysema, anxiety, hyperlipidemia, hypertension, OA, RA    Examination-Activity Limitations Locomotion Level;Sit;Stand;Stairs;Transfers    Examination-Participation Restrictions Community Activity;Shop    Stability/Clinical Decision Making Evolving/Moderate complexity    Rehab Potential Fair    PT Frequency 2x / week    PT Duration 8 weeks    PT Treatment/Interventions ADLs/Self Care Home Management;Cryotherapy;Electrical Stimulation;Moist Heat;Stair training;Functional mobility training;Therapeutic exercise;Balance training;Neuromuscular re-education;Manual techniques;Taping;Dry needling    PT Next Visit Plan Address posterior chain soft tissue mobility deficits, strategies for improving access to terminal knee extension, light [place] drills with gradual introduction of weightbearing exercise and functional movements. Progress HEP as needed.    PT  Home Exercise Plan Access Code [place] and Agree with Plan of Care Patient             Patient will benefit from skilled therapeutic intervention in order to improve the following deficits and impairments:  Abnormal gait, Decreased strength, Pain, Hypomobility, Difficulty  walking  Visit Diagnosis: Chronic pain of right knee  Muscle weakness (generalized)  Chronic pain syndrome     Problem List Patient Active Problem List   Diagnosis Date Noted   Primary osteoarthritis of left knee 01/22/2021   Effusion, right knee 12/15/2020   Primary osteoarthritis of right knee 11/24/2020   Overactive bladder 06/18/2020   Prediabetes 04/14/2020   Slow transit constipation 06/05/2019   Sciatica, left side 01/01/2019   Diverticulosis of colon 04/25/2018   Change in bowel habits    Tobacco use disorder, moderate, in sustained remission 12/22/2017   Moderate episode of recurrent major depressive disorder ([place]) 10/03/2017   Inflammatory spondylopathy of lumbar region ([place]) 10/03/2017   Hoarseness, persistent 04/18/2017   OSA on CPAP 02/18/2017   Cervical disc disease 12/31/2016   Generalized osteoarthritis 12/23/2016   Chronic pain syndrome 12/23/2016   Cystocele, midline 12/06/2016   Urinary incontinence 12/06/2016   Impingement syndrome of right shoulder 04/17/2015   Fibrocystic breast 01/18/2015   Gastroesophageal reflux disease without esophagitis 01/18/2015   Hypothyroidism, postablative 01/18/2015   Arteriosclerosis of coronary artery 12/29/2014   DJD of shoulder 11/13/2014   Fibromyalgia 10/02/2014   Intercostal neuralgia 10/02/2014   Facet syndrome, lumbar 10/02/2014   Sacroiliac joint disease 10/02/2014   Carotid artery narrowing 07/09/2014   Benign essential HTN 07/01/2014   Osteoarthritis of thumb 09/24/2012   Mixed hyperlipidemia 09/22/2012   Arthritis of hand, degenerative 09/22/2012   Valentina Gu, PT, DPT #[id number]  Eilleen Kempf, PT 02/03/2021, 11:04 AM  [facility] [address] [place], [place], [address] Phone: [phone]   Fax:  [phone]  Name: Carrie Myers MRN: [id number] Date of Birth: 1945/04/06

## 2021-02-03 ENCOUNTER — Encounter: Payer: Self-pay | Admitting: Physical Therapy

## 2021-02-04 ENCOUNTER — Ambulatory Visit: Payer: Medicare Other | Admitting: Physical Therapy

## 2021-02-04 ENCOUNTER — Other Ambulatory Visit: Payer: Self-pay

## 2021-02-04 DIAGNOSIS — G8929 Other chronic pain: Secondary | ICD-10-CM | POA: Diagnosis not present

## 2021-02-04 DIAGNOSIS — M6281 Muscle weakness (generalized): Secondary | ICD-10-CM | POA: Diagnosis not present

## 2021-02-04 DIAGNOSIS — M25561 Pain in right knee: Secondary | ICD-10-CM | POA: Diagnosis not present

## 2021-02-04 DIAGNOSIS — G894 Chronic pain syndrome: Secondary | ICD-10-CM | POA: Diagnosis not present

## 2021-02-04 NOTE — Therapy (Signed)
[facility] [address] [place], [place], [address] Phone: [phone]   Fax:  [phone]  Physical Therapy Treatment/ Physical Therapy Progress Note   Dates of reporting period  12/31/20   to   02/04/21    Patient Details  Name: Carrie Myers MRN: [id number] Date of Birth: 03-21-1945 Referring Provider (PT): Rosette Reveal, MD   Encounter Date: 02/04/2021   PT End of Session - 02/08/21 0759     Visit Number 8    Number of Visits 17    Date for PT Re-Evaluation 02/25/21    Authorization Type Medicare, VL based on medical necessity    Authorization Time Period Current cert 98/33/82-50/53/97    Progress Note Due on Visit 18    PT Start Time 1105    PT Stop Time 1145    PT Time Calculation (min) 40 min    Equipment Utilized During Treatment Gait belt    Activity Tolerance Patient limited by pain    Behavior During Therapy [facility] for tasks assessed/performed             Past Medical History:  Diagnosis Date   Allergy    Anxiety    Arthritis    neck, hands, lower back   Bilateral dry eyes    Chronic low back pain 12/23/2016   Coronary artery disease    DDD (degenerative disc disease), lumbar 10/02/2014   Dental crowns present    Implants, front "flipper" while waiting for other implants   Emphysema of lung ([place])    Fibromyalgia    Fibromyalgia    Fracture of right foot 01/14/2015   GERD (gastroesophageal reflux disease)    History of Graves' disease    Hyperlipidemia    Hypertension    Hypothyroidism    Myocardial infarction ([place]) 04/22/2006   Osteoarthritis    Other shoulder lesions, right shoulder 04/17/2015   Rheumatoid arthritis ([place])    Sciatica, left side 01/01/2019   Sleep apnea    CPAP   Status post shoulder surgery 05/27/2015   Thyroid disease    Vertigo    none for approx 9 yrs    Past Surgical History:  Procedure Laterality Date   ABDOMINAL HYSTERECTOMY     total   APPENDECTOMY      BREAST BIOPSY Right    neg   BREAST CYST ASPIRATION Left    neg   BUNIONECTOMY Bilateral    CESAREAN SECTION     COLONOSCOPY  2010   normal   COLONOSCOPY WITH PROPOFOL N/A 04/03/2018   Procedure: COLONOSCOPY WITH BIOPSIES;  Surgeon: Lucilla Lame, MD;  Location: [place];  Service: Endoscopy;  Laterality: N/A;  sleep apnea   CORONARY ANGIOPLASTY WITH STENT PLACEMENT  04/2006   dental implant     [age]   DILATION AND CURETTAGE OF UTERUS     epidural steroid injection  2018   ESOPHAGOGASTRODUODENOSCOPY N/A 10/03/2020   Procedure: ESOPHAGOGASTRODUODENOSCOPY (EGD);  Surgeon: Lesly Rubenstein, MD;  Location: [facility] ENDOSCOPY;  Service: Endoscopy;  Laterality: N/A;  REQUESTING MORNING AFTER 9 AM   EYE SURGERY     bilateral cataract extraction   SHOULDER ARTHROSCOPY Right 05/07/2015   Procedure: Extensive arthroscopic debridement and arthroscopic subacromial decompression, right shoulder.                        2.  Steroid injection right thumb CMC joint.;  Surgeon: Corky Mull, MD;  [facility];  Service: Orthopedics;  Laterality: Right;   TOOTH EXTRACTION     Upper centrals and lateral    There were no vitals filed for this visit.   Subjective Assessment - 02/08/21 0759     Subjective Pt reports that her knees are feeling better. She has comorbid sciatica in L lower limb that is limiting her. Patient reports some discomfort along distal-lateral thigh on her R side. She reports that some lower limb symptoms are associated with longstanding fibromyalgia symptoms. Patient reports 2-3/10 pain in knees and adjacent soft tissues. Patient reports 65% SANE score presently. Patient reports concern with walking on uneven ground and climbing stairs during her trip to [place] in May 2023. Patient reports she mainly wants improved strength in her lower limbs. Pt is tolerating sitting as long as her feet are on the floor. Pt feels that sharp pains have improved s/p her recent  injection. Patient reports knee pain at worst up to 3-4 with shooting quality.    Patient is accompained by: Family member    Pertinent History 75 year old female with R knee pain, Hx of effusion. Referring diagnosis of R knee osteoarthritis. 3 months ago, symptoms began with notable swelling when pt was leaving restaurant. Pt has been using nebulizer for comorbid emphysema. Patient followed up with physician for RLE edema to rule out DVT. Her symptoms improved with prednisone taper for about 3-4 weeks; she reports her symptoms returned after about 4-6 weeks. Pt had X-rays completed with Dr. Zigmund Daniel. Patient reports primary findings of knee effusion ("fluid") and primary osteoarthritis. She states her knee is not as swollen presently. She has ongoing knee pain presently. Patient has upcoming trip in May 2023 in [place]. Hx of fibromyalgia, stage 1 emphysema (pt uses oxygen at night only, uses CPAP machine, inhaler prn), hx of MI 14 years ago (stint placed at the time, good cardio check-up since then). Pt reports most pain along medial aspect of R knee. Pt does have Hx of R ankle fracture with 13 weeks of immobilization/non-weightbearing. Pt reports notable stiffness with prolonged sitting; her symptoms are better when on the move. Pt reports improvement in R knee buckling following aspiration of joint. Pt had 1 fall about 2-3 months ago when reaching over edge of curb with container in her hand; no fracture, but notable ecchymosis. Patient reports some balance issues resulting from medication.    Limitations Sitting;Standing;Walking    How long can you stand comfortably? <10 min    How long can you walk comfortably? 5 min    Diagnostic tests See below    Patient Stated Goals Able to walk on her trip in [place] next year    Pain Onset More than a month ago              OBJECTIVE FINDINGS     Gait Decreased terminal knee extension R>L    Palpation Tenderness to palpation along medial femoral  condyle on R 1+   Strength R/L 4+/4 Hip flexion 4+/4+ Hip external rotation 4/4* Hip internal rotation (L hip pain) 4+/4+ Hip abduction 5/5 Hip adduction 4+/4+  Knee extension 5/5 Knee flexion *indicates pain   AROM   Knee R/L Flexion: 142/142 Extension: 7+/8+ *indicates pain   Hip R/L Flexion: R WNL, L WNL Internal Rotation: R WNL, L 30 External Rotation: R WNL, L WNL *indicates pain [No concordant sign with hip ROM, pain along groin and lateral thigh with L hip flexion, IR, ER]    Ankle R/L 50/50  Ankle Plantarflexion 15/13 Ankle Dorsiflexion 35/35 Ankle Inversion 25/25 Ankle Eversion *Indicates Pain        TREATMENT      Therapeutic Activities  Re-assessment performed (see above)      Neuromuscular Re-education - for nervous system downregulation, quadriceps activation, and exercises to promote LE kinetic chain stability   NuStep, Level 2; x 5 minutes, seat at 8 and handlebars at 8 for nervous system down-regulation and to decrease central sensitivity    Tandem standing; 2x30 sec each position, standing at base of staircase with bilateral handrails adjacent as precaution for potential LOB  Minisquat 0-30 degrees adjacent to raised table; 2x10 [verbal cueing and demonstration for no excessive anterior tibial translation and for pain-free/shallow ROM]     Therapeutic Exercise - for improved soft tissue flexibility and extensibility as needed for ROM, improved strength as needed to improve performance of CKC activities/functional movements     *not today* Straight leg raise; 1x10 bilateral Piriformis stretch, figure-4 position in hooklying; 2x30 sec [reviewed from previous episode of care] Standing terminal knee extension with Green Tband; 2x10, 3 sec Standing heel raise with heel drop off edge of step for plantarflexor stretch; 2x10 Short arc quad; 2x10, 3 sec; 4-lb weight, bilat Heel slide with yellow physioball; 20x Sidelying hip abduction;  2x10; bilat Minisquat; light touch on edge of table; 1x10 [0-30 deg ROM only, fleeting pain with attempted half squat during this set]            ASSESSMENT Patient has made notable progress with pain complaints related to R>L knee. Her condition is complicated by comorbid fibromyalgia and recent flare-up of L lower limb sciatic-type symptoms likely associated with irritated lower lumbar nerve root. Pt is awaiting follow-up with orthopedist related to this issue. Patient demonstrates improved strength and improved knee ROM compared to her initial visit. She is usually compliant with her HEP, but she does have intermittent difficulty maintaining consistency due to fibromyalgia flare-ups and when she is tending to her brother's medical needs. Pt has clinically significant improvement in NPRS; pain did notably improve with recent injection. Pt has good positional tolerance for sitting at this time; she is limited in prolonged standing/walking activity (this is also limited by comorbid low back pain and fibromyalgia). She has partially met strength-based goal with notably improved quadriceps and hip flexor strength. She has good ankle dorsiflexion mobility at this time. Patient has remaining deficits in hip and L quad strength, decreased LE posterior chain soft tissue flexibility bilaterally, mild gait changes, and hyperalgesia of R>L medial knee complex. Patient will benefit from continued skilled therapeutic intervention to address the above deficits as needed for improved function and QoL.         PT Short Term Goals - 02/04/21 1324       PT SHORT TERM GOAL #1   Title Pt will be independent with HEP in order to decrease knee pain and increase flexibility, mobility, and strength in order to improve pain-free function at home and work.    Baseline 12/31/20: Baseline HEP initiated.   02/04/21: Intermittent non-compliance, HEP consistency has improved    Time 2    Period Weeks    Status Partially Met     Target Date 02/18/21               PT Long Term Goals - 02/04/21 1111       PT LONG TERM GOAL #1   Title Patient will demonstrate improved function as evidenced by a score  of 60 on FOTO measure for full participation in activities at home and in the community.    Baseline 12/31/20: FOTO 52   02/04/21: FOTO 49    Time 8    Period Weeks    Status Not Met    Target Date 03/04/21      PT LONG TERM GOAL #2   Title Pt will decrease worst pain as reported on NPRS by at least 3 points in order to demonstrate clinically significant reduction in ankle/foot pain.    Baseline 12/31/20: Pain 10/10 at worst.   02/04/21: Pain up to 3-4/10 in R knee    Time 8    Period Weeks    Status Achieved    Target Date 02/24/21      PT LONG TERM GOAL #3   Title Patient will tolerate standing and sitting up to 1 hour without increase in pain > 2/10 as needed for improved ability to complete household and leisure tasks and participate in family events    Baseline 12/31/20: Difficulty standing > 10 minutes, sitting > 15 mins.    02/04/21: Pt reports tolerating sitting well with feet on ground, pt is limited in standing due to comorbid low back pain and fibromyalgia    Time 8    Period Weeks    Status Partially Met    Target Date 03/04/21      PT LONG TERM GOAL #4   Title Patient will improve MMT to 4+/5 or greater for all tested LE musculature indicative of improved lower limb strength as needed for impoved ability to tolerate loading during prolonged standing activity and prolonged ambulation    Baseline 12/31/20: 4/5 hip flexion and abduction, 4+ R hip ER, 4- L quads.   02/04/21: MMT R/L: Hip flexion 4+/4, Hip IR 4/4; otherwise strength is 4+ or greater    Time 8    Period Weeks    Status Partially Met    Target Date 03/04/21                   Plan - 02/08/21 0815     Clinical Impression Statement Patient has made notable progress with pain complaints related to R>L knee. Her  condition is complicated by comorbid fibromyalgia and recent flare-up of L lower limb sciatic-type symptoms likely associated with irritated lower lumbar nerve root. Pt is awaiting follow-up with orthopedist related to this issue. Patient demonstrates improved strength and improved knee ROM compared to her initial visit. She is usually compliant with her HEP, but she does have intermittent difficulty maintaining consistency due to fibromyalgia flare-ups and when she is tending to her brother's medical needs. Pt has clinically significant improvement in NPRS; pain did notably improve with recent injection. Pt has good positional tolerance for sitting at this time; she is limited in prolonged standing/walking activity (this is also limited by comorbid low back pain and fibromyalgia). She has partially met strength-based goal with notably improved quadriceps and hip flexor strength. She has good ankle dorsiflexion mobility at this time. Patient has remaining deficits in hip and L quad strength, decreased LE posterior chain soft tissue flexibility bilaterally, mild gait changes, and hyperalgesia of R>L medial knee complex. Patient will benefit from continued skilled therapeutic intervention to address the above deficits as needed for improved function and QoL.    Personal Factors and Comorbidities Age;Comorbidity 3+;Past/Current Experience    Comorbidities fibromyalgia, heart disease, emphysema, anxiety, hyperlipidemia, hypertension, OA, RA    Examination-Activity Limitations Locomotion Level;Sit;Stand;Stairs;Transfers  Examination-Participation Restrictions Community Activity;Shop    Stability/Clinical Decision Making Evolving/Moderate complexity    Rehab Potential Fair    PT Frequency 2x / week    PT Duration 8 weeks    PT Treatment/Interventions ADLs/Self Care Home Management;Cryotherapy;Electrical Stimulation;Moist Heat;Stair training;Functional mobility training;Therapeutic exercise;Balance  training;Neuromuscular re-education;Manual techniques;Taping;Dry needling    PT Next Visit Plan Moderate [place] drills for strengthening with gradual introduction of weightbearing exercise and functional movements. Progress HEP as needed.    PT Home Exercise Plan Access Code [place] and Agree with Plan of Care Patient             Patient will benefit from skilled therapeutic intervention in order to improve the following deficits and impairments:  Abnormal gait, Decreased strength, Pain, Hypomobility, Difficulty walking  Visit Diagnosis: Chronic pain of right knee  Muscle weakness (generalized)  Chronic pain syndrome     Problem List Patient Active Problem List   Diagnosis Date Noted   Primary osteoarthritis of left knee 01/22/2021   Effusion, right knee 12/15/2020   Primary osteoarthritis of right knee 11/24/2020   Overactive bladder 06/18/2020   Prediabetes 04/14/2020   Slow transit constipation 06/05/2019   Sciatica, left side 01/01/2019   Diverticulosis of colon 04/25/2018   Change in bowel habits    Tobacco use disorder, moderate, in sustained remission 12/22/2017   Moderate episode of recurrent major depressive disorder ([place]) 10/03/2017   Inflammatory spondylopathy of lumbar region ([place]) 10/03/2017   Hoarseness, persistent 04/18/2017   OSA on CPAP 02/18/2017   Cervical disc disease 12/31/2016   Generalized osteoarthritis 12/23/2016   Chronic pain syndrome 12/23/2016   Cystocele, midline 12/06/2016   Urinary incontinence 12/06/2016   Impingement syndrome of right shoulder 04/17/2015   Fibrocystic breast 01/18/2015   Gastroesophageal reflux disease without esophagitis 01/18/2015   Hypothyroidism, postablative 01/18/2015   Arteriosclerosis of coronary artery 12/29/2014   DJD of shoulder 11/13/2014   Fibromyalgia 10/02/2014   Intercostal neuralgia 10/02/2014   Facet syndrome, lumbar 10/02/2014   Sacroiliac joint disease 10/02/2014   Carotid artery  narrowing 07/09/2014   Benign essential HTN 07/01/2014   Osteoarthritis of thumb 09/24/2012   Mixed hyperlipidemia 09/22/2012   Arthritis of hand, degenerative 09/22/2012   Valentina Gu, PT, DPT #[id number]  Eilleen Kempf, PT 02/08/2021, 8:20 AM  [facility] [address] [place], [place], [address] Phone: [phone]   Fax:  [phone]  Name: Carrie Myers MRN: [id number] Date of Birth: 1945/05/21

## 2021-02-08 ENCOUNTER — Encounter: Payer: Self-pay | Admitting: Physical Therapy

## 2021-02-09 ENCOUNTER — Other Ambulatory Visit: Payer: Self-pay

## 2021-02-09 ENCOUNTER — Encounter: Payer: Self-pay | Admitting: Physical Therapy

## 2021-02-09 ENCOUNTER — Ambulatory Visit: Payer: Medicare Other | Admitting: Physical Therapy

## 2021-02-09 DIAGNOSIS — M6281 Muscle weakness (generalized): Secondary | ICD-10-CM | POA: Diagnosis not present

## 2021-02-09 DIAGNOSIS — M25561 Pain in right knee: Secondary | ICD-10-CM

## 2021-02-09 DIAGNOSIS — G8929 Other chronic pain: Secondary | ICD-10-CM | POA: Diagnosis not present

## 2021-02-09 DIAGNOSIS — G894 Chronic pain syndrome: Secondary | ICD-10-CM

## 2021-02-09 NOTE — Therapy (Signed)
[place] [facility] [address] [place], [place], [address] Phone: [phone]   Fax:  [phone]  Physical Therapy Treatment  Patient Details  Name: Carrie Myers MRN: [id number] Date of Birth: 03-03-1946 Referring Provider (PT): Rosette Reveal, MD   Encounter Date: 02/09/2021   PT End of Session - 02/09/21 1107     Visit Number 9    Number of Visits 17    Date for PT Re-Evaluation 02/25/21    Authorization Type Medicare, VL based on medical necessity    Authorization Time Period Current cert 50/03/70-48/88/91, prog note 02/04/21    Progress Note Due on Visit 18    PT Start Time 1104    PT Stop Time 1145    PT Time Calculation (min) 41 min    Equipment Utilized During Treatment Gait belt    Activity Tolerance Patient limited by pain    Behavior During Therapy WFL for tasks assessed/performed             Past Medical History:  Diagnosis Date   Allergy    Anxiety    Arthritis    neck, hands, lower back   Bilateral dry eyes    Chronic low back pain 12/23/2016   Coronary artery disease    DDD (degenerative disc disease), lumbar 10/02/2014   Dental crowns present    Implants, front "flipper" while waiting for other implants   Emphysema of lung ([place])    Fibromyalgia    Fibromyalgia    Fracture of right foot 01/14/2015   GERD (gastroesophageal reflux disease)    History of Graves' disease    Hyperlipidemia    Hypertension    Hypothyroidism    Myocardial infarction ([place]) 04/22/2006   Osteoarthritis    Other shoulder lesions, right shoulder 04/17/2015   Rheumatoid arthritis ([place])    Sciatica, left side 01/01/2019   Sleep apnea    CPAP   Status post shoulder surgery 05/27/2015   Thyroid disease    Vertigo    none for approx 9 yrs    Past Surgical History:  Procedure Laterality Date   ABDOMINAL HYSTERECTOMY     total   APPENDECTOMY     BREAST BIOPSY Right    neg   BREAST CYST ASPIRATION Left    neg    BUNIONECTOMY Bilateral    CESAREAN SECTION     COLONOSCOPY  2010   normal   COLONOSCOPY WITH PROPOFOL N/A 04/03/2018   Procedure: COLONOSCOPY WITH BIOPSIES;  Surgeon: Lucilla Lame, MD;  Location: [place];  Service: Endoscopy;  Laterality: N/A;  sleep apnea   CORONARY ANGIOPLASTY WITH STENT PLACEMENT  04/2006   dental implant     [age]   DILATION AND CURETTAGE OF UTERUS     epidural steroid injection  2018   ESOPHAGOGASTRODUODENOSCOPY N/A 10/03/2020   Procedure: ESOPHAGOGASTRODUODENOSCOPY (EGD);  Surgeon: Lesly Rubenstein, MD;  Location: [facility] ENDOSCOPY;  Service: Endoscopy;  Laterality: N/A;  REQUESTING MORNING AFTER 9 AM   EYE SURGERY     bilateral cataract extraction   SHOULDER ARTHROSCOPY Right 05/07/2015   Procedure: Extensive arthroscopic debridement and arthroscopic subacromial decompression, right shoulder.                        2.  Steroid injection right thumb CMC joint.;  Surgeon: Corky Mull, MD;  Location: [place];  Service: Orthopedics;  Laterality: Right;   TOOTH EXTRACTION     Upper  centrals and lateral    There were no vitals filed for this visit.   Subjective Assessment - 02/09/21 1106     Subjective Patient reports that her knees are not giving her significant issue this AM. She reports that she has not been able to schedule appointment with her physician yet for comorbid L lower extremity pain/sciatic-type symptoms. Patient reports compliance with her HEP.    Patient is accompained by: Family member    Pertinent History 75 year old female with R knee pain, Hx of effusion. Referring diagnosis of R knee osteoarthritis. 3 months ago, symptoms began with notable swelling when pt was leaving restaurant. Pt has been using nebulizer for comorbid emphysema. Patient followed up with physician for RLE edema to rule out DVT. Her symptoms improved with prednisone taper for about 3-4 weeks; she reports her symptoms returned after about 4-6 weeks. Pt had  X-rays completed with Dr. Zigmund Daniel. Patient reports primary findings of knee effusion ("fluid") and primary osteoarthritis. She states her knee is not as swollen presently. She has ongoing knee pain presently. Patient has upcoming trip in May 2023 in [place]. Hx of fibromyalgia, stage 1 emphysema (pt uses oxygen at night only, uses CPAP machine, inhaler prn), hx of MI 14 years ago (stint placed at the time, good cardio check-up since then). Pt reports most pain along medial aspect of R knee. Pt does have Hx of R ankle fracture with 13 weeks of immobilization/non-weightbearing. Pt reports notable stiffness with prolonged sitting; her symptoms are better when on the move. Pt reports improvement in R knee buckling following aspiration of joint. Pt had 1 fall about 2-3 months ago when reaching over edge of curb with container in her hand; no fracture, but notable ecchymosis. Patient reports some balance issues resulting from medication.    Limitations Sitting;Standing;Walking    How long can you stand comfortably? <10 min    How long can you walk comfortably? 5 min    Diagnostic tests See below    Patient Stated Goals Able to walk on her trip in [place] next year    Pain Onset More than a month ago                TREATMENT      Manual Therapy - for symptom modulation, soft tissue sensitivity and mobility, joint mobility, ROM    Gentle bilat knee PROM within pt tolerance L long-leg distraction for pain relief; x10 sec on, 10 sec off; 5 minutes STM and IASTM with Hypervolt along L gluteal mm in R sidelying     *not today* R anterior tibiofemoral mobilization, gr I-II (Bilateral) Patellar mobilization, grade III, all planes; x 2 min ea STM along R gastrocs and STM/IASTM with roller along hamstrings in hooklying position TF distraction      Neuromuscular Re-education - for nervous system downregulation, quadriceps activation, and exercises to promote LE kinetic chain stability   NuStep,  Level 3; x 5 minutes, seat at 8 and handlebars at 8 for nervous system down-regulation and to decrease central sensitivity    Tandem standing; 2x30 sec each position, standing at base of staircase with bilateral handrails adjacent as precaution for potential LOB   Minisquat 0-30 degrees adjacent to raised table; 2x10 [verbal cueing and demonstration for no excessive anterior tibial translation and for pain-free/shallow ROM]  3-way hip; x4 ea dir, bilateral [stopped early given comorbid L lower limb pain/sciatica]  In // bars: Consecutive step over 3 Airex pads with reciprocal stepping pattern; 2x D/B  Therapeutic Exercise - for improved soft tissue flexibility and extensibility as needed for ROM, improved strength as needed to improve performance of CKC activities/functional movements   Piriformis stretch, figure-4 position in hooklying; 2x30 sec [reviewed from previous episode of care]     *not today* Straight leg raise; 1x10 bilateral Standing terminal knee extension with Green Tband; 2x10, 3 sec Standing heel raise with heel drop off edge of step for plantarflexor stretch; 2x10 Short arc quad; 2x10, 3 sec; 4-lb weight, bilat Heel slide with yellow physioball; 20x Sidelying hip abduction; 2x10; bilat       MHP (unbilled) utilized following long-leg distraction for analgesic effect and improved soft tissue extensibility in R side-lying along L gluteal musculature; x 5 minutes       ASSESSMENT Patient has good bilateral knee ROM at this time. She has ongoing L-sided sciatica that is likely stemming from irritated lumbar spinal nerve root. Pt is awaiting follow-up with physician to treat this; pt has historically responded well with lumbar spine injections. Pt tolerates majority of exercises well, but she does have difficulty with 3-way hip with L lower limb (LLE open-chain), so volume with this exercise is limited today. Pt is making good progress with anterior knee pain and  knee mobility deficits. Patient has remaining deficits in hip and L quad strength, decreased LE posterior chain soft tissue flexibility bilaterally, mild gait changes, and hyperalgesia of R>L medial knee complex. Patient will benefit from continued skilled therapeutic intervention to address the above deficits as needed for improved function and QoL.         PT Short Term Goals - 02/04/21 1324       PT SHORT TERM GOAL #1   Title Pt will be independent with HEP in order to decrease knee pain and increase flexibility, mobility, and strength in order to improve pain-free function at home and work.    Baseline 12/31/20: Baseline HEP initiated.   02/04/21: Intermittent non-compliance, HEP consistency has improved    Time 2    Period Weeks    Status Partially Met    Target Date 02/18/21               PT Long Term Goals - 02/04/21 1111       PT LONG TERM GOAL #1   Title Patient will demonstrate improved function as evidenced by a score of 60 on FOTO measure for full participation in activities at home and in the community.    Baseline 12/31/20: FOTO 52   02/04/21: FOTO 49    Time 8    Period Weeks    Status Not Met    Target Date 03/04/21      PT LONG TERM GOAL #2   Title Pt will decrease worst pain as reported on NPRS by at least 3 points in order to demonstrate clinically significant reduction in ankle/foot pain.    Baseline 12/31/20: Pain 10/10 at worst.   02/04/21: Pain up to 3-4/10 in R knee    Time 8    Period Weeks    Status Achieved    Target Date 02/24/21      PT LONG TERM GOAL #3   Title Patient will tolerate standing and sitting up to 1 hour without increase in pain > 2/10 as needed for improved ability to complete household and leisure tasks and participate in family events    Baseline 12/31/20: Difficulty standing > 10 minutes, sitting > 15 mins.    02/04/21: Pt reports tolerating sitting  well with feet on ground, pt is limited in standing due to comorbid low back  pain and fibromyalgia    Time 8    Period Weeks    Status Partially Met    Target Date 03/04/21      PT LONG TERM GOAL #4   Title Patient will improve MMT to 4+/5 or greater for all tested LE musculature indicative of improved lower limb strength as needed for impoved ability to tolerate loading during prolonged standing activity and prolonged ambulation    Baseline 12/31/20: 4/5 hip flexion and abduction, 4+ R hip ER, 4- L quads.   02/04/21: MMT R/L: Hip flexion 4+/4, Hip IR 4/4; otherwise strength is 4+ or greater    Time 8    Period Weeks    Status Partially Met    Target Date 03/04/21                   Plan - 02/09/21 1525     Clinical Impression Statement Patient has good bilateral knee ROM at this time. She has ongoing L-sided sciatica that is likely stemming from irritated lumbar spinal nerve root. Pt is awaiting follow-up with physician to treat this; pt has historically responded well with lumbar spine injections. Pt tolerates majority of exercises well, but she does have difficulty with 3-way hip with L lower limb (LLE open-chain), so volume with this exercise is limited today. Pt is making good progress with anterior knee pain and knee mobility deficits. Patient has remaining deficits in hip and L quad strength, decreased LE posterior chain soft tissue flexibility bilaterally, mild gait changes, and hyperalgesia of R>L medial knee complex. Patient will benefit from continued skilled therapeutic intervention to address the above deficits as needed for improved function and QoL.    Personal Factors and Comorbidities Age;Comorbidity 3+;Past/Current Experience    Comorbidities fibromyalgia, heart disease, emphysema, anxiety, hyperlipidemia, hypertension, OA, RA    Examination-Activity Limitations Locomotion Level;Sit;Stand;Stairs;Transfers    Examination-Participation Restrictions Community Activity;Shop    Stability/Clinical Decision Making Evolving/Moderate complexity     Rehab Potential Fair    PT Frequency 2x / week    PT Duration 8 weeks    PT Treatment/Interventions ADLs/Self Care Home Management;Cryotherapy;Electrical Stimulation;Moist Heat;Stair training;Functional mobility training;Therapeutic exercise;Balance training;Neuromuscular re-education;Manual techniques;Taping;Dry needling    PT Next Visit Plan Moderate [place] drills for strengthening with gradual introduction of weightbearing exercise and functional movements. Progress HEP as needed.    PT Home Exercise Plan Access Code [email or web address]    Consulted and Agree with Plan of Care Patient             Patient will benefit from skilled therapeutic intervention in order to improve the following deficits and impairments:  Abnormal gait, Decreased strength, Pain, Hypomobility, Difficulty walking  Visit Diagnosis: Chronic pain of right knee  Muscle weakness (generalized)  Chronic pain syndrome     Problem List Patient Active Problem List   Diagnosis Date Noted   Primary osteoarthritis of left knee 01/22/2021   Effusion, right knee 12/15/2020   Primary osteoarthritis of right knee 11/24/2020   Overactive bladder 06/18/2020   Prediabetes 04/14/2020   Slow transit constipation 06/05/2019   Sciatica, left side 01/01/2019   Diverticulosis of colon 04/25/2018   Change in bowel habits    Tobacco use disorder, moderate, in sustained remission 12/22/2017   Moderate episode of recurrent major depressive disorder ([place]) 10/03/2017   Inflammatory spondylopathy of lumbar region ([place]) 10/03/2017   Hoarseness, persistent 04/18/2017   OSA on CPAP  02/18/2017   Cervical disc disease 12/31/2016   Generalized osteoarthritis 12/23/2016   Chronic pain syndrome 12/23/2016   Cystocele, midline 12/06/2016   Urinary incontinence 12/06/2016   Impingement syndrome of right shoulder 04/17/2015   Fibrocystic breast 01/18/2015   Gastroesophageal reflux disease without esophagitis 01/18/2015   Hypothyroidism,  postablative 01/18/2015   Arteriosclerosis of coronary artery 12/29/2014   DJD of shoulder 11/13/2014   Fibromyalgia 10/02/2014   Intercostal neuralgia 10/02/2014   Facet syndrome, lumbar 10/02/2014   Sacroiliac joint disease 10/02/2014   Carotid artery narrowing 07/09/2014   Benign essential HTN 07/01/2014   Osteoarthritis of thumb 09/24/2012   Mixed hyperlipidemia 09/22/2012   Arthritis of hand, degenerative 09/22/2012   Valentina Gu, PT, DPT #[id number]  Eilleen Kempf, PT 02/09/2021, 3:26 PM  [place] [facility] [address] [place], [place], [address] Phone: [phone]   Fax:  [phone]  Name: DASANI THURLOW MRN: [id number] Date of Birth: 1945-06-25

## 2021-02-11 ENCOUNTER — Ambulatory Visit: Payer: Medicare Other | Admitting: Physical Therapy

## 2021-02-11 DIAGNOSIS — Z20828 Contact with and (suspected) exposure to other viral communicable diseases: Secondary | ICD-10-CM | POA: Diagnosis not present

## 2021-02-16 ENCOUNTER — Ambulatory Visit: Payer: Medicare Other | Admitting: Physical Therapy

## 2021-02-16 DIAGNOSIS — I10 Essential (primary) hypertension: Secondary | ICD-10-CM | POA: Diagnosis not present

## 2021-02-16 DIAGNOSIS — E782 Mixed hyperlipidemia: Secondary | ICD-10-CM | POA: Diagnosis not present

## 2021-02-16 DIAGNOSIS — I6523 Occlusion and stenosis of bilateral carotid arteries: Secondary | ICD-10-CM | POA: Diagnosis not present

## 2021-02-16 DIAGNOSIS — R0602 Shortness of breath: Secondary | ICD-10-CM | POA: Diagnosis not present

## 2021-02-16 DIAGNOSIS — I25118 Atherosclerotic heart disease of native coronary artery with other forms of angina pectoris: Secondary | ICD-10-CM | POA: Diagnosis not present

## 2021-02-16 NOTE — Patient Instructions (Incomplete)
°  °  TREATMENT       Manual Therapy - for symptom modulation, soft tissue sensitivity and mobility, joint mobility, ROM      Gentle bilat knee PROM within pt tolerance L long-leg distraction for pain relief; x10 sec on, 10 sec off; 5 minutes STM and IASTM with Hypervolt along L gluteal mm in R sidelying     *not today* R anterior tibiofemoral mobilization, gr I-II (Bilateral) Patellar mobilization, grade III, all planes; x 2 min ea STM along R gastrocs and STM/IASTM with roller along hamstrings in hooklying position TF distraction      Neuromuscular Re-education - for nervous system downregulation, quadriceps activation, and exercises to promote LE kinetic chain stability   NuStep, Level 3; x 5 minutes, seat at 8 and handlebars at 8 for nervous system down-regulation and to decrease central sensitivity    Tandem standing; 2x30 sec each position, standing at base of staircase with bilateral handrails adjacent as precaution for potential LOB   Minisquat 0-30 degrees adjacent to raised table; 2x10 [verbal cueing and demonstration for no excessive anterior tibial translation and for pain-free/shallow ROM]   3-way hip; x4 ea dir, bilateral [stopped early given comorbid L lower limb pain/sciatica]   In // bars: Consecutive step over 3 Airex pads with reciprocal stepping pattern; 2x D/B         Therapeutic Exercise - for improved soft tissue flexibility and extensibility as needed for ROM, improved strength as needed to improve performance of CKC activities/functional movements   Piriformis stretch, figure-4 position in hooklying; 2x30 sec [reviewed from previous episode of care]       *not today* Straight leg raise; 1x10 bilateral Standing terminal knee extension with Green Tband; 2x10, 3 sec Standing heel raise with heel drop off edge of step for plantarflexor stretch; 2x10 Short arc quad; 2x10, 3 sec; 4-lb weight, bilat Heel slide with yellow physioball; 20x Sidelying hip  abduction; 2x10; bilat       MHP (unbilled) utilized following long-leg distraction for analgesic effect and improved soft tissue extensibility in R side-lying along L gluteal musculature; x 5 minutes         ASSESSMENT Patient has good bilateral knee ROM at this time. She has ongoing L-sided sciatica that is likely stemming from irritated lumbar spinal nerve root. Pt is awaiting follow-up with physician to treat this; pt has historically responded well with lumbar spine injections. Pt tolerates majority of exercises well, but she does have difficulty with 3-way hip with L lower limb (LLE open-chain), so volume with this exercise is limited today. Pt is making good progress with anterior knee pain and knee mobility deficits. Patient has remaining deficits in hip and L quad strength, decreased LE posterior chain soft tissue flexibility bilaterally, mild gait changes, and hyperalgesia of R>L medial knee complex. Patient will benefit from continued skilled therapeutic intervention to address the above deficits as needed for improved function and QoL.

## 2021-02-17 ENCOUNTER — Telehealth: Payer: Self-pay | Admitting: Internal Medicine

## 2021-02-17 DIAGNOSIS — M797 Fibromyalgia: Secondary | ICD-10-CM

## 2021-02-17 NOTE — Telephone Encounter (Signed)
Requested medication (s) are due for refill today: yes  Requested medication (s) are on the active medication list: yes  Last refill:  06/11/20 #180 1 RF  Future visit scheduled: yes  Notes to clinic:  Refill not delegated to NT to RF   Requested Prescriptions  Pending Prescriptions Disp Refills   Diclofenac-miSOPROStol 75-0.2 MG TBEC [Pharmacy Med Name: DICLOFENAC-MISOPROSTOL 75-0.2 MG DR] 180 tablet 1    Sig: TAKE (1) TABLET BY MOUTH TWICE DAILY     Not Delegated - Analgesics:  Antirheumatic Agents - diclofenac/misoprostol Failed - 02/17/2021 11:50 AM      Failed - This refill cannot be delegated      Passed - Cr in normal range and within 360 days    Creatinine, Ser  Date Value Ref Range Status  09/05/2020 0.94 0.57 - 1.00 mg/dL Final          Passed - HGB in normal range and within 360 days    Hemoglobin  Date Value Ref Range Status  03/28/2020 13.2 11.1 - 15.9 g/dL Final          Passed - Patient is not pregnant      Passed - Valid encounter within last 12 months    Recent Outpatient Visits           3 weeks ago Primary osteoarthritis of right knee   [facility] Montel Culver, MD   2 months ago Primary osteoarthritis of right knee   [facility] Montel Culver, MD   2 months ago Primary osteoarthritis of right knee   [facility] Montel Culver, MD   2 months ago Primary osteoarthritis of right knee   [facility] Montel Culver, MD   3 months ago Acute pain of right knee   [facility] [name], MD       Future Appointments             In 1 month Army Melia Jesse Sans, MD [facility], [place]   In 1 month Army Melia Jesse Sans, MD [facility], [place]   In 3 months Army Melia, Jesse Sans, MD [facility], [facility]

## 2021-02-17 NOTE — Telephone Encounter (Signed)
Kaylee at the pharmacy needs a call back to confirm Rx, please advise

## 2021-02-18 ENCOUNTER — Ambulatory Visit: Payer: Medicare Other | Admitting: Physical Therapy

## 2021-02-18 NOTE — Telephone Encounter (Signed)
Carrie Myers informed of alternative being requested and will just fill diclofenac alone.

## 2021-02-23 ENCOUNTER — Ambulatory Visit: Payer: Medicare Other | Admitting: Physical Therapy

## 2021-02-24 ENCOUNTER — Other Ambulatory Visit: Payer: Self-pay | Admitting: Anesthesiology

## 2021-02-24 ENCOUNTER — Ambulatory Visit
Admission: RE | Admit: 2021-02-24 | Discharge: 2021-02-24 | Disposition: A | Discharge status: Home / Self Care | Payer: Medicare Other | Source: Ambulatory Visit | Attending: Anesthesiology | Admitting: Anesthesiology

## 2021-02-24 ENCOUNTER — Encounter: Payer: Self-pay | Admitting: Anesthesiology

## 2021-02-24 ENCOUNTER — Ambulatory Visit (HOSPITAL_BASED_OUTPATIENT_CLINIC_OR_DEPARTMENT_OTHER): Payer: Medicare Other | Admitting: Anesthesiology

## 2021-02-24 ENCOUNTER — Other Ambulatory Visit: Payer: Self-pay

## 2021-02-24 VITALS — BP 150/95 | HR 85 | Resp 18 | Ht 68.0 in | Wt 179.0 lb

## 2021-02-24 DIAGNOSIS — M797 Fibromyalgia: Secondary | ICD-10-CM

## 2021-02-24 DIAGNOSIS — M5136 Other intervertebral disc degeneration, lumbar region: Secondary | ICD-10-CM | POA: Diagnosis present

## 2021-02-24 DIAGNOSIS — M545 Low back pain, unspecified: Secondary | ICD-10-CM

## 2021-02-24 DIAGNOSIS — R52 Pain, unspecified: Secondary | ICD-10-CM

## 2021-02-24 DIAGNOSIS — M5442 Lumbago with sciatica, left side: Secondary | ICD-10-CM | POA: Insufficient documentation

## 2021-02-24 DIAGNOSIS — M542 Cervicalgia: Secondary | ICD-10-CM

## 2021-02-24 DIAGNOSIS — G8929 Other chronic pain: Secondary | ICD-10-CM

## 2021-02-24 DIAGNOSIS — M51369 Other intervertebral disc degeneration, lumbar region without mention of lumbar back pain or lower extremity pain: Secondary | ICD-10-CM

## 2021-02-24 DIAGNOSIS — F1190 Opioid use, unspecified, uncomplicated: Secondary | ICD-10-CM | POA: Insufficient documentation

## 2021-02-24 DIAGNOSIS — M47816 Spondylosis without myelopathy or radiculopathy, lumbar region: Secondary | ICD-10-CM

## 2021-02-24 DIAGNOSIS — M5441 Lumbago with sciatica, right side: Secondary | ICD-10-CM

## 2021-02-24 DIAGNOSIS — M5432 Sciatica, left side: Secondary | ICD-10-CM

## 2021-02-24 MED ORDER — TRIAMCINOLONE ACETONIDE 40 MG/ML IJ SUSP
INTRAMUSCULAR | Status: AC
  Filled 2021-02-24: qty 1

## 2021-02-24 MED ORDER — HYDROCODONE-ACETAMINOPHEN 5-325 MG PO TABS: 1.0000 | ORAL_TABLET | Freq: Four times a day (QID) | ORAL | 0 refills | Status: DC | PRN

## 2021-02-24 MED ORDER — ROPIVACAINE HCL 2 MG/ML IJ SOLN
10.0000 mL | Freq: Once | INTRAMUSCULAR | Status: AC
  Administered 2021-02-24: 1 mL via EPIDURAL

## 2021-02-24 MED ORDER — HYDROCODONE-ACETAMINOPHEN 5-325 MG PO TABS: 1.0000 | ORAL_TABLET | Freq: Two times a day (BID) | ORAL | 0 refills | Status: DC | PRN

## 2021-02-24 MED ORDER — SODIUM CHLORIDE 0.9% FLUSH
10.0000 mL | Freq: Once | INTRAVENOUS | Status: AC
  Administered 2021-02-24: 10 mL

## 2021-02-24 MED ORDER — IOHEXOL 180 MG/ML  SOLN
10.0000 mL | Freq: Once | INTRAMUSCULAR | Status: AC | PRN
  Administered 2021-02-24: 5 mL via EPIDURAL

## 2021-02-24 MED ORDER — LIDOCAINE HCL (PF) 1 % IJ SOLN
INTRAMUSCULAR | Status: AC
  Filled 2021-02-24: qty 10

## 2021-02-24 MED ORDER — LIDOCAINE HCL (PF) 1 % IJ SOLN
5.0000 mL | Freq: Once | INTRAMUSCULAR | Status: AC
  Administered 2021-02-24: 5 mL via SUBCUTANEOUS

## 2021-02-24 MED ORDER — SODIUM CHLORIDE (PF) 0.9 % IJ SOLN
INTRAMUSCULAR | Status: AC
  Filled 2021-02-24: qty 10

## 2021-02-24 MED ORDER — TRIAMCINOLONE ACETONIDE 40 MG/ML IJ SUSP
40.0000 mg | Freq: Once | INTRAMUSCULAR | Status: AC
  Administered 2021-02-24: 40 mg

## 2021-02-24 MED ORDER — ROPIVACAINE HCL 2 MG/ML IJ SOLN
INTRAMUSCULAR | Status: AC
  Filled 2021-02-24: qty 20

## 2021-02-24 MED ORDER — DIAZEPAM 5 MG PO TABS: 5.0000 mg | ORAL_TABLET | Freq: Every day | ORAL | 5 refills | Status: DC | PRN

## 2021-02-24 NOTE — Patient Instructions (Signed)
Pain Management Discharge Instructions  General Discharge Instructions :  If you need to reach your doctor call: Monday-Friday 8:00 am - 4:00 pm at [phone] or toll free [phone].  After clinic hours [phone] to have operator reach doctor.  Bring all of your medication bottles to all your appointments in the pain clinic.  To cancel or reschedule your appointment with Pain Management please remember to call 24 hours in advance to avoid a fee.  Refer to the educational materials which you have been given on: General Risks, I had my Procedure. Discharge Instructions, Post Sedation.  Post Procedure Instructions:  The drugs you were given will stay in your system until tomorrow, so for the next 24 hours you should not drive, make any legal decisions or drink any alcoholic beverages.  You may eat anything you prefer, but it is better to start with liquids then soups and crackers, and gradually work up to solid foods.  Please notify your doctor immediately if you have any unusual bleeding, trouble breathing or pain that is not related to your normal pain.  Depending on the type of procedure that was done, some parts of your body may feel week and/or numb.  This usually clears up by tonight or the next day.  Walk with the use of an assistive device or accompanied by an adult for the 24 hours.  You may use ice on the affected area for the first 24 hours.  Put ice in a Ziploc bag and cover with a towel and place against area 15 minutes on 15 minutes off.  You may switch to heat after 24 hours.Epidural Steroid Injection Patient Information  Description: The epidural space surrounds the nerves as they exit the spinal cord.  In some patients, the nerves can be compressed and inflamed by a bulging disc or a tight spinal canal (spinal stenosis).  By injecting steroids into the epidural space, we can bring irritated nerves into direct contact with a potentially helpful medication.  These  steroids act directly on the irritated nerves and can reduce swelling and inflammation which often leads to decreased pain.  Epidural steroids may be injected anywhere along the spine and from the neck to the low back depending upon the location of your pain.   After numbing the skin with local anesthetic (like Novocaine), a small needle is passed into the epidural space slowly.  You may experience a sensation of pressure while this is being done.  The entire block usually last less than 10 minutes.  Conditions which may be treated by epidural steroids:  Low back and leg pain Neck and arm pain Spinal stenosis Post-laminectomy syndrome Herpes zoster (shingles) pain Pain from compression fractures  Preparation for the injection:  Do not eat any solid food or dairy products within 8 hours of your appointment.  You may drink clear liquids up to 3 hours before appointment.  Clear liquids include water, black coffee, juice or soda.  No milk or cream please. You may take your regular medication, including pain medications, with a sip of water before your appointment  Diabetics should hold regular insulin (if taken separately) and take 1/2 normal NPH dos the morning of the procedure.  Carry some sugar containing items with you to your appointment. A driver must accompany you and be prepared to drive you home after your procedure.  Bring all your current medications with your. An IV may be inserted and sedation may be given at the discretion of the physician.     A blood pressure cuff, EKG and other monitors will often be applied during the procedure.  Some patients may need to have extra oxygen administered for a short period. You will be asked to provide medical information, including your allergies, prior to the procedure.  We must know immediately if you are taking blood thinners (like Coumadin/Warfarin)  Or if you are allergic to IV iodine contrast (dye). We must know if you could possible be  pregnant.  Possible side-effects: Bleeding from needle site Infection (rare, may require surgery) Nerve injury (rare) Numbness & tingling (temporary) Difficulty urinating (rare, temporary) Spinal headache ( a headache worse with upright posture) Light -headedness (temporary) Pain at injection site (several days) Decreased blood pressure (temporary) Weakness in arm/leg (temporary) Pressure sensation in back/neck (temporary)  Call if you experience: Fever/chills associated with headache or increased back/neck pain. Headache worsened by an upright position. New onset weakness or numbness of an extremity below the injection site Hives or difficulty breathing (go to the emergency room) Inflammation or drainage at the infection site Severe back/neck pain Any new symptoms which are concerning to you  Please note:  Although the local anesthetic injected can often make your back or neck feel good for several hours after the injection, the pain will likely return.  It takes 3-7 days for steroids to work in the epidural space.  You may not notice any pain relief for at least that one week.  If effective, we will often do a series of three injections spaced 3-6 weeks apart to maximally decrease your pain.  After the initial series, we generally will wait several months before considering a repeat injection of the same type.  If you have any questions, please call [phone] [facility] 

## 2021-02-25 ENCOUNTER — Telehealth: Payer: Self-pay

## 2021-02-25 ENCOUNTER — Encounter: Payer: Medicare Other | Admitting: Physical Therapy

## 2021-02-25 NOTE — Telephone Encounter (Signed)
Post procedure phone call.  Patient states she is doing fine.  

## 2021-02-26 ENCOUNTER — Encounter: Payer: Self-pay | Admitting: Anesthesiology

## 2021-02-26 NOTE — Progress Notes (Signed)
Subjective:  Patient ID: Carrie Myers, female    DOB: [birth year]  Age: 75 y.o. MRN: [id number]  CC: Back Pain (low) and Leg Pain (Left leg laterally to the left ankle)   Procedure: L5-S1 epidural steroid under fluoroscopic guidance with no sedation  HPI Carrie Myers presents for reevaluation.  Unfortunately Diamante continues to have severe low back pain with worsening sciatica symptoms.  It generally affects both legs but is preferentially affecting her left side.  No change in bowel or bladder function is noted and despite efforts at medication management and physical therapy exercises at home she continues to have pain.  In the past she has had injection therapy and done well with this she reports.  She desires to proceed with a repeat epidural today.  No other change in lower extremity strength or function of bowel or bladder function is noted and she does well with her current medication management.  She takes Valium once a night to help with insomnia and muscle spasms at night.  She takes her hydrocodone during the day.  She reports that she does not take these to concomitantly.  Otherwise the quality characteristic and distribution of the low back pain and leg pain is unchanged.  Outpatient Medications Prior to Visit  Medication Sig Dispense Refill   Armodafinil 150 MG tablet Take 150 mg by mouth daily.     aspirin EC 81 MG tablet Take 81 mg by mouth daily.     citalopram (CELEXA) 20 MG tablet TAKE 1 TABLET BY MOUTH  DAILY 90 tablet 3   COMBIVENT RESPIMAT 20-100 MCG/ACT AERS respimat SMARTSIG:2 Inhalation Via Inhaler 4 Times Daily PRN     cyclobenzaprine (FLEXERIL) 10 MG tablet Take 10 mg by mouth 2 (two) times daily.     diclofenac (VOLTAREN) 75 MG EC tablet Take 1 tablet (75 mg total) by mouth 2 (two) times daily. 60 tablet 0   DULoxetine (CYMBALTA) 60 MG capsule TAKE 1 CAPSULE BY MOUTH  DAILY 90 capsule 3   HYDROcodone-acetaminophen (NORCO/VICODIN) 5-325 MG tablet Take 1  tablet by mouth 2 (two) times daily. 45 tablet 0   hydrocortisone 2.5 % cream Apply topically 2 (two) times daily as needed (Rash). 30 g 2   ketoconazole (NIZORAL) 2 % shampoo 1-2 times a week lather on scalp/forehead, leave on 5-8 minutes, rinse well 120 mL 3   levocetirizine (XYZAL) 5 MG tablet Take by mouth.     levothyroxine (SYNTHROID) 88 MCG tablet TAKE 1 TABLET BY MOUTH  DAILY 90 tablet 3   Loperamide HCl (IMODIUM PO) Take 1 tablet by mouth as needed.     losartan (COZAAR) 50 MG tablet Take 50 mg by mouth daily.     meloxicam (MOBIC) 7.5 MG tablet Take 1 tablet (7.5 mg total) by mouth daily as needed for pain. 30 tablet 0   mirabegron ER (MYRBETRIQ) 50 MG TB24 tablet Take 1 tablet (50 mg total) by mouth daily. 30 tablet 11   Multiple Vitamin (MULTI-VITAMINS) TABS Take by mouth.     pantoprazole (PROTONIX) 40 MG tablet Take 1 tablet by mouth 2 (two) times daily.     pimecrolimus (ELIDEL) 1 % cream Apply BID to affected areas on face/eyelids 30 g 2   pravastatin (PRAVACHOL) 80 MG tablet Take 80 mg by mouth daily.     pregabalin (LYRICA) 50 MG capsule Take 50 mg by mouth 2 (two) times daily.     diazepam (VALIUM) 5 MG tablet Take 5 mg by  mouth daily as needed.     HYDROcodone-acetaminophen (NORCO/VICODIN) 5-325 MG tablet Take 1 tablet by mouth 2 (two) times daily as needed for moderate pain. 45 tablet 0   esomeprazole (NEXIUM) 20 MG capsule Take 20 mg by mouth 2 (two) times daily before a meal. (Patient not taking: Reported on 02/24/2021)     fluocinonide (LIDEX) 0.05 % external solution Apply BID to affected areas on scalp PRN itching (Patient not taking: Reported on 02/24/2021) 60 mL 2   ipratropium (ATROVENT) 0.06 % nasal spray Place into both nostrils. (Patient not taking: Reported on 02/24/2021)     prednisoLONE acetate (PRED FORTE) 1 % ophthalmic suspension Apply to eye. (Patient not taking: Reported on 02/24/2021)     tolterodine (DETROL LA) 4 MG 24 hr capsule TAKE 1 CAPSULE(4 MG) BY  MOUTH DAILY (Patient not taking: Reported on 02/24/2021) 90 capsule 3   No facility-administered medications prior to visit.    Review of Systems CNS: No confusion or sedation Cardiac: No angina or palpitations GI: No abdominal pain or constipation Constitutional: No nausea vomiting fevers or chills  Objective:  BP (!) 150/95    Pulse 85    Resp 18    Ht 5\' 8"  (1.727 m)    Wt 179 lb (81.2 kg)    SpO2 100%    BMI 27.22 kg/m    BP Readings from Last 3 Encounters:  02/24/21 (!) 150/95  01/22/21 128/84  12/15/20 138/82     Wt Readings from Last 3 Encounters:  02/24/21 179 lb (81.2 kg)  01/22/21 179 lb (81.2 kg)  12/15/20 180 lb (81.6 kg)     Physical Exam Pt is alert and oriented PERRL EOMI HEART IS RRR no murmur or rub LCTA no wheezing or rales MUSCULOSKELETAL reveals some paraspinous muscle tenderness but no overt trigger points.  She walks with an antalgic gait.  Her muscle tone and bulk is at baseline.  She has a positive straight leg raise on the left side.  It is equivocal on the right  Labs  Lab Results  Component Value Date   HGBA1C 6.1 (H) 09/05/2020   HGBA1C 6.5 (H) 03/28/2020   Lab Results  Component Value Date   LDLCALC 70 03/28/2020   CREATININE 0.94 09/05/2020    -------------------------------------------------------------------------------------------------------------------- Lab Results  Component Value Date   WBC 6.5 03/28/2020   HGB 13.2 03/28/2020   HCT 38.2 03/28/2020   PLT 290 03/28/2020   GLUCOSE 85 09/05/2020   CHOL 147 03/28/2020   TRIG 156 (H) 03/28/2020   HDL 50 03/28/2020   LDLCALC 70 03/28/2020   ALT 36 (H) 03/28/2020   AST 35 03/28/2020   NA 138 09/05/2020   K 4.8 09/05/2020   CL 98 09/05/2020   CREATININE 0.94 09/05/2020   BUN 27 09/05/2020   CO2 24 09/05/2020   TSH 2.160 03/28/2020   HGBA1C 6.1 (H) 09/05/2020     --------------------------------------------------------------------------------------------------------------------- DG PAIN CLINIC C-ARM 1-60 MIN NO REPORT  Result Date: 02/24/2021 Fluoro was used, but no Radiologist interpretation will be provided. Please refer to "NOTES" tab for provider progress note.    Assessment & Plan:   Vicki was seen today for back pain and leg pain.  Diagnoses and all orders for this visit:  Chronic bilateral low back pain with bilateral sciatica  DDD (degenerative disc disease), lumbar  Fibromyalgia  Chronic, continuous use of opioids  Chronic bilateral low back pain without sciatica  Facet syndrome, lumbar  Sciatica, left side  Cervicalgia  Other orders -     triamcinolone acetonide (KENALOG-40) injection 40 mg -     sodium chloride flush (NS) 0.9 % injection 10 mL -     ropivacaine (PF) 2 mg/mL (0.2%) (NAROPIN) injection 10 mL -     lidocaine (PF) (XYLOCAINE) 1 % injection 5 mL -     iohexol (OMNIPAQUE) 180 MG/ML injection 10 mL -     diazepam (VALIUM) 5 MG tablet; Take 1 tablet (5 mg total) by mouth daily as needed. -     HYDROcodone-acetaminophen (NORCO/VICODIN) 5-325 MG tablet; Take 1 tablet by mouth 2 (two) times daily as needed for moderate pain. -     HYDROcodone-acetaminophen (NORCO/VICODIN) 5-325 MG tablet; Take 1 tablet by mouth every 6 (six) hours as needed for moderate pain or severe pain.        ----------------------------------------------------------------------------------------------------------------------  Problem List Items Addressed This Visit       Unprioritized   Fibromyalgia (Chronic)   Relevant Medications   HYDROcodone-acetaminophen (NORCO/VICODIN) 5-325 MG tablet   HYDROcodone-acetaminophen (NORCO/VICODIN) 5-325 MG tablet (Start on 03/26/2021)   Facet syndrome, lumbar   Relevant Medications   HYDROcodone-acetaminophen (NORCO/VICODIN) 5-325 MG tablet   HYDROcodone-acetaminophen (NORCO/VICODIN)  5-325 MG tablet (Start on 03/26/2021)   Sciatica, left side   Relevant Medications   diazepam (VALIUM) 5 MG tablet (Start on 03/25/2021)   Other Visit Diagnoses     Chronic bilateral low back pain with bilateral sciatica    -  Primary   Relevant Medications   triamcinolone acetonide (KENALOG-40) injection 40 mg (Completed)   ropivacaine (PF) 2 mg/mL (0.2%) (NAROPIN) injection 10 mL (Completed)   lidocaine (PF) (XYLOCAINE) 1 % injection 5 mL (Completed)   diazepam (VALIUM) 5 MG tablet (Start on 03/25/2021)   HYDROcodone-acetaminophen (NORCO/VICODIN) 5-325 MG tablet   HYDROcodone-acetaminophen (NORCO/VICODIN) 5-325 MG tablet (Start on 03/26/2021)   DDD (degenerative disc disease), lumbar       Relevant Medications   triamcinolone acetonide (KENALOG-40) injection 40 mg (Completed)   HYDROcodone-acetaminophen (NORCO/VICODIN) 5-325 MG tablet   HYDROcodone-acetaminophen (NORCO/VICODIN) 5-325 MG tablet (Start on 03/26/2021)   Chronic, continuous use of opioids       Chronic bilateral low back pain without sciatica       Relevant Medications   triamcinolone acetonide (KENALOG-40) injection 40 mg (Completed)   ropivacaine (PF) 2 mg/mL (0.2%) (NAROPIN) injection 10 mL (Completed)   lidocaine (PF) (XYLOCAINE) 1 % injection 5 mL (Completed)   HYDROcodone-acetaminophen (NORCO/VICODIN) 5-325 MG tablet   HYDROcodone-acetaminophen (NORCO/VICODIN) 5-325 MG tablet (Start on 03/26/2021)   Cervicalgia             ----------------------------------------------------------------------------------------------------------------------  1. Chronic bilateral low back pain with bilateral sciatica We will proceed with an epidural steroid injection today.  We gone over the risks and benefits of the procedure with her in full detail all questions have been answered.  I want her to continue with her stretching strengthening exercises as previously reviewed with return to clinic in 22-month schedule.  2. DDD  (degenerative disc disease), lumbar As above  3. Fibromyalgia As above and continue with home therapy stretching strengthening exercises as reviewed  4. Chronic, continuous use of opioids I have reviewed the [facility] practitioner database information and it is appropriate for refill today.  I think it is reasonable for her to continue on her once a day Valium as this is helping with her sleep and overall wellbeing.  No side effects are reported.  It also helps with her muscle spasms  at night.  She is to continue with her hydrocodone and tramadol as prescribed  5. Chronic bilateral low back pain without sciatica As above  6. Facet syndrome, lumbar As above  7. Sciatica, left side As above  8. Cervicalgia Continue upper extremity stretching strengthening as reviewed    ----------------------------------------------------------------------------------------------------------------------  I have changed [place] Jack. Poblano's diazepam. I am also having her start on HYDROcodone-acetaminophen. Additionally, I am having her maintain her esomeprazole, losartan, pravastatin, aspirin EC, Multi-Vitamins, Loperamide HCl (IMODIUM PO), citalopram, tolterodine, levothyroxine, Combivent Respimat, mirabegron ER, pantoprazole, Armodafinil, ipratropium, DULoxetine, cyclobenzaprine, pregabalin, pimecrolimus, ketoconazole, fluocinonide, hydrocortisone, HYDROcodone-acetaminophen, levocetirizine, meloxicam, prednisoLONE acetate, diclofenac, and HYDROcodone-acetaminophen. We administered triamcinolone acetonide, sodium chloride flush, ropivacaine (PF) 2 mg/mL (0.2%), lidocaine (PF), and iohexol.   Meds ordered this encounter  Medications   triamcinolone acetonide (KENALOG-40) injection 40 mg   sodium chloride flush (NS) 0.9 % injection 10 mL   ropivacaine (PF) 2 mg/mL (0.2%) (NAROPIN) injection 10 mL   lidocaine (PF) (XYLOCAINE) 1 % injection 5 mL   iohexol (OMNIPAQUE) 180 MG/ML injection 10 mL    diazepam (VALIUM) 5 MG tablet    Sig: Take 1 tablet (5 mg total) by mouth daily as needed.    Dispense:  30 tablet    Refill:  5   HYDROcodone-acetaminophen (NORCO/VICODIN) 5-325 MG tablet    Sig: Take 1 tablet by mouth 2 (two) times daily as needed for moderate pain.    Dispense:  45 tablet    Refill:  0    To be filled once per month..30 day supply   HYDROcodone-acetaminophen (NORCO/VICODIN) 5-325 MG tablet    Sig: Take 1 tablet by mouth every 6 (six) hours as needed for moderate pain or severe pain.    Dispense:  45 tablet    Refill:  0   Patient's Medications  New Prescriptions   HYDROCODONE-ACETAMINOPHEN (NORCO/VICODIN) 5-325 MG TABLET    Take 1 tablet by mouth every 6 (six) hours as needed for moderate pain or severe pain.  Previous Medications   ARMODAFINIL 150 MG TABLET    Take 150 mg by mouth daily.   ASPIRIN EC 81 MG TABLET    Take 81 mg by mouth daily.   CITALOPRAM (CELEXA) 20 MG TABLET    TAKE 1 TABLET BY MOUTH  DAILY   COMBIVENT RESPIMAT 20-100 MCG/ACT AERS RESPIMAT    SMARTSIG:2 Inhalation Via Inhaler 4 Times Daily PRN   CYCLOBENZAPRINE (FLEXERIL) 10 MG TABLET    Take 10 mg by mouth 2 (two) times daily.   DICLOFENAC (VOLTAREN) 75 MG EC TABLET    Take 1 tablet (75 mg total) by mouth 2 (two) times daily.   DULOXETINE (CYMBALTA) 60 MG CAPSULE    TAKE 1 CAPSULE BY MOUTH  DAILY   ESOMEPRAZOLE (NEXIUM) 20 MG CAPSULE    Take 20 mg by mouth 2 (two) times daily before a meal.   FLUOCINONIDE (LIDEX) 0.05 % EXTERNAL SOLUTION    Apply BID to affected areas on scalp PRN itching   HYDROCODONE-ACETAMINOPHEN (NORCO/VICODIN) 5-325 MG TABLET    Take 1 tablet by mouth 2 (two) times daily.   HYDROCORTISONE 2.5 % CREAM    Apply topically 2 (two) times daily as needed (Rash).   IPRATROPIUM (ATROVENT) 0.06 % NASAL SPRAY    Place into both nostrils.   KETOCONAZOLE (NIZORAL) 2 % SHAMPOO    1-2 times a week lather on scalp/forehead, leave on 5-8 minutes, rinse well   LEVOCETIRIZINE (XYZAL) 5 MG  TABLET  Take by mouth.   LEVOTHYROXINE (SYNTHROID) 88 MCG TABLET    TAKE 1 TABLET BY MOUTH  DAILY   LOPERAMIDE HCL (IMODIUM PO)    Take 1 tablet by mouth as needed.   LOSARTAN (COZAAR) 50 MG TABLET    Take 50 mg by mouth daily.   MELOXICAM (MOBIC) 7.5 MG TABLET    Take 1 tablet (7.5 mg total) by mouth daily as needed for pain.   MIRABEGRON ER (MYRBETRIQ) 50 MG TB24 TABLET    Take 1 tablet (50 mg total) by mouth daily.   MULTIPLE VITAMIN (MULTI-VITAMINS) TABS    Take by mouth.   PANTOPRAZOLE (PROTONIX) 40 MG TABLET    Take 1 tablet by mouth 2 (two) times daily.   PIMECROLIMUS (ELIDEL) 1 % CREAM    Apply BID to affected areas on face/eyelids   PRAVASTATIN (PRAVACHOL) 80 MG TABLET    Take 80 mg by mouth daily.   PREDNISOLONE ACETATE (PRED FORTE) 1 % OPHTHALMIC SUSPENSION    Apply to eye.   PREGABALIN (LYRICA) 50 MG CAPSULE    Take 50 mg by mouth 2 (two) times daily.   TOLTERODINE (DETROL LA) 4 MG 24 HR CAPSULE    TAKE 1 CAPSULE(4 MG) BY MOUTH DAILY  Modified Medications   Modified Medication Previous Medication   DIAZEPAM (VALIUM) 5 MG TABLET diazepam (VALIUM) 5 MG tablet      Take 1 tablet (5 mg total) by mouth daily as needed.    Take 5 mg by mouth daily as needed.   HYDROCODONE-ACETAMINOPHEN (NORCO/VICODIN) 5-325 MG TABLET HYDROcodone-acetaminophen (NORCO/VICODIN) 5-325 MG tablet      Take 1 tablet by mouth 2 (two) times daily as needed for moderate pain.    Take 1 tablet by mouth 2 (two) times daily as needed for moderate pain.  Discontinued Medications   No medications on file   ----------------------------------------------------------------------------------------------------------------------  Follow-up: Return in about 2 months (around 04/27/2021) for evaluation, med refill.   Procedure: L5-S1 LESI with fluoroscopic guidance and no moderate sedation  NOTE: The risks, benefits, and expectations of the procedure have been discussed and explained to the patient who was understanding  and in agreement with suggested treatment plan. No guarantees were made.  DESCRIPTION OF PROCEDURE: Lumbar epidural steroid injection with no IV Versed, EKG, blood pressure, pulse, and pulse oximetry monitoring. The procedure was performed with the patient in the prone position under fluoroscopic guidance.  Sterile prep x3 was initiated and I then injected subcutaneous lidocaine to the overlying L5-S1 site after its fluoroscopic identifictation.  Using strict aseptic technique, I then advanced an 18-gauge Tuohy epidural needle in the midline using interlaminar approach via loss-of-resistance to saline technique. There was negative aspiration for heme or  CSF.  I then confirmed position with both AP and Lateral fluoroscan.  2 cc of contrast dye were injected and a  total of 5 mL of Preservative-Free normal saline mixed with 40 mg of Kenalog and 1cc Ropicaine 0.2 percent were injected incrementally via the  epidurally placed needle. The needle was removed. The patient tolerated the injection well and was convalesced and discharged to home in stable condition. Should the patient have any post procedure difficulty they have been instructed on how to contact us for assistance.    Molli Barrows, MD

## 2021-02-27 DIAGNOSIS — H903 Sensorineural hearing loss, bilateral: Secondary | ICD-10-CM | POA: Diagnosis not present

## 2021-02-27 DIAGNOSIS — H6982 Other specified disorders of Eustachian tube, left ear: Secondary | ICD-10-CM | POA: Diagnosis not present

## 2021-03-04 DIAGNOSIS — I6523 Occlusion and stenosis of bilateral carotid arteries: Secondary | ICD-10-CM | POA: Diagnosis not present

## 2021-03-04 DIAGNOSIS — I6522 Occlusion and stenosis of left carotid artery: Secondary | ICD-10-CM | POA: Diagnosis not present

## 2021-03-04 DIAGNOSIS — R0602 Shortness of breath: Secondary | ICD-10-CM | POA: Diagnosis not present

## 2021-03-04 DIAGNOSIS — I25118 Atherosclerotic heart disease of native coronary artery with other forms of angina pectoris: Secondary | ICD-10-CM | POA: Diagnosis not present

## 2021-03-06 ENCOUNTER — Other Ambulatory Visit: Payer: Self-pay | Admitting: Internal Medicine

## 2021-03-06 DIAGNOSIS — M797 Fibromyalgia: Secondary | ICD-10-CM

## 2021-03-06 NOTE — Telephone Encounter (Signed)
Requested medication (s) are due for refill today:   Provider to review  Requested medication (s) are on the active medication list:   No  Future visit scheduled:   Yes   Last ordered: Not sure  Did not see this on her list  Also a non delegated refill.   Requested Prescriptions  Pending Prescriptions Disp Refills   Diclofenac-miSOPROStol 75-0.2 MG TBEC [Pharmacy Med Name: DICLOFENAC-MISOPROSTOL 75-0.2 MG DR] 180 tablet 1    Sig: TAKE (1) TABLET BY MOUTH TWICE DAILY     Not Delegated - Analgesics:  Antirheumatic Agents - diclofenac/misoprostol Failed - 03/06/2021  1:00 PM      Failed - This refill cannot be delegated      Passed - Cr in normal range and within 360 days    Creatinine, Ser  Date Value Ref Range Status  09/05/2020 0.94 0.57 - 1.00 mg/dL Final          Passed - HGB in normal range and within 360 days    Hemoglobin  Date Value Ref Range Status  03/28/2020 13.2 11.1 - 15.9 g/dL Final          Passed - Patient is not pregnant      Passed - Valid encounter within last 12 months    Recent Outpatient Visits           1 month ago Primary osteoarthritis of right knee   [facility] Montel Culver, MD   2 months ago Primary osteoarthritis of right knee   [facility] Montel Culver, MD   2 months ago Primary osteoarthritis of right knee   [facility] Montel Culver, MD   3 months ago Primary osteoarthritis of right knee   [facility] Montel Culver, MD   3 months ago Acute pain of right knee   [facility] [name], MD       Future Appointments             In 2 weeks Army Melia Jesse Sans, MD [facility], [place]   In 3 weeks [name], MD [facility], [place]   In 3 months Army Melia, Jesse Sans, MD [facility], [facility]

## 2021-03-13 ENCOUNTER — Other Ambulatory Visit: Payer: Self-pay | Admitting: Anesthesiology

## 2021-03-17 ENCOUNTER — Telehealth: Payer: Self-pay

## 2021-03-17 NOTE — Telephone Encounter (Signed)
Her diazapam is out and she is not supposed to get if filled until 11/11 can someone release it to be filled today

## 2021-03-17 NOTE — Telephone Encounter (Signed)
I will call the pharmacy and check on this and call Dr. Andree Elk if necessary.

## 2021-03-17 NOTE — Telephone Encounter (Signed)
Called patient. I will check with the pharmacy and Dr. Andree Elk and fill today if appropriate.

## 2021-03-18 DIAGNOSIS — I2510 Atherosclerotic heart disease of native coronary artery without angina pectoris: Secondary | ICD-10-CM | POA: Diagnosis not present

## 2021-03-18 DIAGNOSIS — J438 Other emphysema: Secondary | ICD-10-CM | POA: Diagnosis not present

## 2021-03-18 DIAGNOSIS — E782 Mixed hyperlipidemia: Secondary | ICD-10-CM | POA: Diagnosis not present

## 2021-03-18 DIAGNOSIS — I10 Essential (primary) hypertension: Secondary | ICD-10-CM | POA: Diagnosis not present

## 2021-03-18 DIAGNOSIS — I25118 Atherosclerotic heart disease of native coronary artery with other forms of angina pectoris: Secondary | ICD-10-CM | POA: Diagnosis not present

## 2021-03-18 DIAGNOSIS — I6523 Occlusion and stenosis of bilateral carotid arteries: Secondary | ICD-10-CM | POA: Diagnosis not present

## 2021-03-19 ENCOUNTER — Telehealth: Payer: Self-pay

## 2021-03-19 NOTE — Telephone Encounter (Signed)
Spoke to pt let her know that her appt on 1/10 is for Dr. Zigmund Daniel and the 1/17 appt is for Dr. Zigmund Daniel. Pt verbalized understanding.  KP

## 2021-03-19 NOTE — Telephone Encounter (Signed)
Copied from [place] [id number]. Topic: Appointment Scheduling - Scheduling Inquiry for Clinic >> Mar 19, 2021 12:16 PM Antonieta Iba C wrote: Reason for CRM: pt has an apt on 1/10 and also on 1/17. Pt is unsure if both appointments are needed?   Please assist pt further.

## 2021-03-20 ENCOUNTER — Encounter: Payer: Self-pay | Admitting: Internal Medicine

## 2021-03-20 ENCOUNTER — Ambulatory Visit (INDEPENDENT_AMBULATORY_CARE_PROVIDER_SITE_OTHER): Payer: Medicare Other | Admitting: Internal Medicine

## 2021-03-20 ENCOUNTER — Other Ambulatory Visit: Payer: Self-pay

## 2021-03-20 VITALS — BP 122/68 | HR 89 | Temp 97.7°F | Ht 68.0 in | Wt 178.2 lb

## 2021-03-20 DIAGNOSIS — H6690 Otitis media, unspecified, unspecified ear: Secondary | ICD-10-CM | POA: Diagnosis not present

## 2021-03-20 DIAGNOSIS — J0101 Acute recurrent maxillary sinusitis: Secondary | ICD-10-CM | POA: Diagnosis not present

## 2021-03-20 MED ORDER — PREDNISONE 10 MG PO TABS
10.0000 mg | ORAL_TABLET | ORAL | 0 refills | Status: AC
Start: 2021-03-20 — End: 2021-03-26

## 2021-03-20 MED ORDER — AZITHROMYCIN 250 MG PO TABS: ORAL_TABLET | ORAL | 0 refills | Status: AC

## 2021-03-20 NOTE — Progress Notes (Signed)
Date:  03/20/2021   Name:  Carrie Myers   DOB:  13-Oct-1945   MRN:  [id number]   Chief Complaint: Cough  Cough This is a new problem. The current episode started in the past 7 days. The cough is Productive of sputum. Associated symptoms include ear pain, nasal congestion, postnasal drip and rhinorrhea. Pertinent negatives include no chest pain, chills, fever, sore throat, shortness of breath or wheezing.  Sinus Problem This is a recurrent problem. The problem has been gradually worsening since onset. There has been no fever. Associated symptoms include coughing, ear pain, a hoarse voice and sinus pressure. Pertinent negatives include no chills, shortness of breath, sneezing, sore throat or swollen glands.   Lab Results  Component Value Date   NA 138 09/05/2020   K 4.8 09/05/2020   CO2 24 09/05/2020   GLUCOSE 85 09/05/2020   BUN 27 09/05/2020   CREATININE 0.94 09/05/2020   CALCIUM 9.6 09/05/2020   EGFR 64 09/05/2020   GFRNONAA 57 (L) 03/28/2020   Lab Results  Component Value Date   CHOL 147 03/28/2020   HDL 50 03/28/2020   LDLCALC 70 03/28/2020   TRIG 156 (H) 03/28/2020   CHOLHDL 2.9 03/28/2020   Lab Results  Component Value Date   TSH 2.160 03/28/2020   Lab Results  Component Value Date   HGBA1C 6.1 (H) 09/05/2020   Lab Results  Component Value Date   WBC 6.5 03/28/2020   HGB 13.2 03/28/2020   HCT 38.2 03/28/2020   MCV 88 03/28/2020   PLT 290 03/28/2020   Lab Results  Component Value Date   ALT 36 (H) 03/28/2020   AST 35 03/28/2020   ALKPHOS 80 03/28/2020   BILITOT 0.3 03/28/2020   No results found for: 25OHVITD2, 25OHVITD3, VD25OH   Review of Systems  Constitutional:  Negative for chills, fatigue and fever.  HENT:  Positive for ear pain, hoarse voice, postnasal drip, rhinorrhea and sinus pressure. Negative for sneezing and sore throat.   Respiratory:  Positive for cough. Negative for shortness of breath and wheezing.   Cardiovascular:  Negative for  chest pain.  Gastrointestinal:  Negative for nausea.  Psychiatric/Behavioral:  Positive for sleep disturbance (from cough). Negative for dysphoric mood. The patient is not nervous/anxious.    Patient Active Problem List   Diagnosis Date Noted   Primary osteoarthritis of left knee 01/22/2021   Effusion, right knee 12/15/2020   Primary osteoarthritis of right knee 11/24/2020   Overactive bladder 06/18/2020   Prediabetes 04/14/2020   Slow transit constipation 06/05/2019   Sciatica, left side 01/01/2019   Diverticulosis of colon 04/25/2018   Change in bowel habits    Tobacco use disorder, moderate, in sustained remission 12/22/2017   Moderate episode of recurrent major depressive disorder ([place]) 10/03/2017   Inflammatory spondylopathy of lumbar region ([place]) 10/03/2017   Hoarseness, persistent 04/18/2017   OSA on CPAP 02/18/2017   Cervical disc disease 12/31/2016   Generalized osteoarthritis 12/23/2016   Chronic pain syndrome 12/23/2016   Cystocele, midline 12/06/2016   Urinary incontinence 12/06/2016   Impingement syndrome of right shoulder 04/17/2015   Fibrocystic breast 01/18/2015   Gastroesophageal reflux disease without esophagitis 01/18/2015   Hypothyroidism, postablative 01/18/2015   Arteriosclerosis of coronary artery 12/29/2014   DJD of shoulder 11/13/2014   Fibromyalgia 10/02/2014   Intercostal neuralgia 10/02/2014   Facet syndrome, lumbar 10/02/2014   Sacroiliac joint disease 10/02/2014   Carotid artery narrowing 07/09/2014   Benign essential HTN 07/01/2014  Osteoarthritis of thumb 09/24/2012   Mixed hyperlipidemia 09/22/2012   Arthritis of hand, degenerative 09/22/2012    Allergies  Allergen Reactions   Augmentin [Amoxicillin-Pot Clavulanate] Nausea And Vomiting   Codeine Nausea And Vomiting   Sulfa Antibiotics Nausea And Vomiting   Proparacaine Itching   Shellfish Allergy Nausea And Vomiting    No issues with iodine     Past Surgical History:  Procedure  Laterality Date   ABDOMINAL HYSTERECTOMY     total   APPENDECTOMY     BREAST BIOPSY Right    neg   BREAST CYST ASPIRATION Left    neg   BUNIONECTOMY Bilateral    CESAREAN SECTION     COLONOSCOPY  2010   normal   COLONOSCOPY WITH PROPOFOL N/A 04/03/2018   Procedure: COLONOSCOPY WITH BIOPSIES;  Surgeon: Lucilla Lame, MD;  Location: [facility];  Service: Endoscopy;  Laterality: N/A;  sleep apnea   CORONARY ANGIOPLASTY WITH STENT PLACEMENT  04/2006   dental implant     [age]   DILATION AND CURETTAGE OF UTERUS     epidural steroid injection  2018   ESOPHAGOGASTRODUODENOSCOPY N/A 10/03/2020   Procedure: ESOPHAGOGASTRODUODENOSCOPY (EGD);  Surgeon: Lesly Rubenstein, MD;  Location[facility] END[facility]COPY;  Service: Endoscopy;  Laterality: N/A;  REQUESTING MORNING AFTER 9 AM   EYE SURGERY     bilateral cataract extraction   SHOULDER ARTHROSCOPY Right 05/07/2015   Procedure: Extensive arthroscopic debridement and arthroscopic subacromial decompression, right shoulder.                        2.  Steroid injection right thumb CMC joint.;  Surgeon: Corky Mull, MD;  Location: [place];  Service: Orthopedics;  Laterality: Right;   TOOTH EXTRACTION     Upper centrals and lateral    Social History   Tobacco Use   Smoking status: Former    Packs/day: 1.25    Years: 44.00    Pack years: 55.00    Types: Cigarettes    Quit date: 04/22/2006    Years since quitting: 14.9   Smokeless tobacco: Never   Tobacco comments:       Vaping Use   Vaping Use: Never used  Substance Use Topics   Alcohol use: Not Currently   Drug use: Never     Medication list has been reviewed and updated.  Current Meds  Medication Sig   azithromycin (ZITHROMAX Z-PAK) 250 MG tablet UAD   predniSONE (DELTASONE) 10 MG tablet Take 1 tablet (10 mg total) by mouth as directed for 6 days. Take 6,5,4,3,2,1 then stop    PHQ 2/9 Scores 03/20/2021 01/22/2021 12/15/2020 12/08/2020  PHQ - 2 Score 0 0 0 0  PHQ- 9  Score 0 0 0 0  Exception Documentation - - - -    GAD 7 : Generalized Anxiety Score 03/20/2021 01/22/2021 12/15/2020 12/08/2020  Nervous, Anxious, on Edge 0 0 0 0  Control/stop worrying 0 0 0 0  Worry too much - different things 0 0 0 0  Trouble relaxing 0 0 0 0  Restless 0 0 0 0  Easily annoyed or irritable 0 0 0 0  Afraid - awful might happen 0 0 0 0  Total GAD 7 Score 0 0 0 0  Anxiety Difficulty Not difficult at all Not difficult at all Not difficult at all Not difficult at all    BP Readings from Last 3 Encounters:  03/20/21 122/68  02/24/21 (!) 150/95  01/22/21 128/84    Physical Exam Constitutional:      Appearance: She is well-developed.  HENT:     Right Ear: Ear canal and external ear normal. Tympanic membrane is erythematous. Tympanic membrane is not retracted.     Left Ear: Ear canal and external ear normal. Tympanic membrane is erythematous. Tympanic membrane is not retracted.     Nose:     Right Sinus: Maxillary sinus tenderness and frontal sinus tenderness present.     Left Sinus: Maxillary sinus tenderness and frontal sinus tenderness present.     Mouth/Throat:     Mouth: No oral lesions.     Pharynx: Uvula midline. Posterior oropharyngeal erythema present. No oropharyngeal exudate.  Cardiovascular:     Rate and Rhythm: Normal rate and regular rhythm.     Heart sounds: Normal heart sounds.  Pulmonary:     Breath sounds: Normal breath sounds. No wheezing or rales.  Musculoskeletal:     Cervical back: Normal range of motion.  Lymphadenopathy:     Cervical: No cervical adenopathy.  Neurological:     Mental Status: She is alert and oriented to person, place, and time.    Wt Readings from Last 3 Encounters:  03/20/21 178 lb 3.2 oz (80.8 kg)  02/24/21 179 lb (81.2 kg)  01/22/21 179 lb (81.2 kg)    BP 122/68    Pulse 89    Temp 97.7 F (36.5 C) (Oral)    Ht 5' 8"  (1.727 m)    Wt 178 lb 3.2 oz (80.8 kg)    SpO2 100%    BMI 27.10 kg/m   Assessment and  Plan: 1. Acute recurrent maxillary sinusitis Continue fluids; Mucinex - azithromycin (ZITHROMAX Z-PAK) 250 MG tablet; UAD  Dispense: 6 each; Refill: 0 - predniSONE (DELTASONE) 10 MG tablet; Take 1 tablet (10 mg total) by mouth as directed for 6 days. Take 6,5,4,3,2,1 then stop  Dispense: 21 tablet; Refill: 0  2. Acute otitis media, unspecified otitis media type - azithromycin (ZITHROMAX Z-PAK) 250 MG tablet; UAD  Dispense: 6 each; Refill: 0   Partially dictated using Editor, commissioning. Any errors are unintentional.  Halina Maidens, MD [facility]  03/20/2021

## 2021-03-23 ENCOUNTER — Other Ambulatory Visit: Payer: Self-pay | Admitting: Internal Medicine

## 2021-03-23 NOTE — Telephone Encounter (Signed)
Requested Prescriptions  Pending Prescriptions Disp Refills   diclofenac (VOLTAREN) 75 MG EC tablet [Pharmacy Med Name: DICLOFENAC SODIUM 75 MG DR TAB] 60 tablet 0    Sig: TAKE (1) TABLET BY MOUTH TWICE DAILY     Analgesics:  NSAIDS Passed - 03/23/2021  8:37 AM      Passed - Cr in normal range and within 360 days    Creatinine, Ser  Date Value Ref Range Status  09/05/2020 0.94 0.57 - 1.00 mg/dL Final         Passed - HGB in normal range and within 360 days    Hemoglobin  Date Value Ref Range Status  03/28/2020 13.2 11.1 - 15.9 g/dL Final         Passed - Patient is not pregnant      Passed - Valid encounter within last 12 months    Recent Outpatient Visits          3 days ago Acute recurrent maxillary sinusitis   [facility] [name], MD   2 months ago Primary osteoarthritis of right knee   [facility] Montel Culver, MD   3 months ago Primary osteoarthritis of right knee   [facility] Montel Culver, MD   3 months ago Primary osteoarthritis of right knee   [facility] Montel Culver, MD   3 months ago Primary osteoarthritis of right knee   [facility] Montel Culver, MD      Future Appointments            In 1 week Army Melia Jesse Sans, MD [facility], [place]   In 2 months Army Melia, Jesse Sans, MD [facility]

## 2021-03-24 ENCOUNTER — Ambulatory Visit: Payer: Medicare Other | Admitting: Family Medicine

## 2021-03-26 ENCOUNTER — Telehealth: Payer: Self-pay

## 2021-03-26 ENCOUNTER — Ambulatory Visit: Payer: Self-pay | Admitting: *Deleted

## 2021-03-26 ENCOUNTER — Other Ambulatory Visit: Payer: Self-pay | Admitting: Internal Medicine

## 2021-03-26 DIAGNOSIS — J0101 Acute recurrent maxillary sinusitis: Secondary | ICD-10-CM

## 2021-03-26 MED ORDER — CEFUROXIME AXETIL 500 MG PO TABS: 500.0000 mg | ORAL_TABLET | Freq: Two times a day (BID) | ORAL | 0 refills | Status: AC

## 2021-03-26 NOTE — Telephone Encounter (Signed)
Please review.  KP

## 2021-03-26 NOTE — Telephone Encounter (Signed)
Called pt left VM to call back. Please read pt message above from Dr. Army Melia.  [facility] nurse may give results to patient if they return call to clinic, a CRM has been created.  KP

## 2021-03-26 NOTE — Telephone Encounter (Signed)
Seen in OV 03/20/21 with cough and congestion. Cough has improved. Congestion and sinus pressure is worse. Blows clear then green mucous. HA on/off. Low grade fever yesterday none today. Completed prednisone yesterday and abx 2 days ago.  Not using any NS-suggested Ocean spray NS often for congestion, Humidifier in the room for moisture, increasing water intake.  Patient is requesting A different abx for the sinuses.  Pharmacy on file.     Reason for Disposition  [1] Taking antibiotic > 72 hours (3 days) AND [2] sinus pain not improved  Answer Assessment - Initial Assessment Questions 1. ANTIBIOTIC: "What antibiotic are you receiving?" "How many times per day?"     Completed zpak 2 days ago. Reminded patient this medication continues to work 4-5 more days once completed.  2. ONSET: "When was the antibiotic started?"     03/20/20 3. PAIN: "How bad is the sinus pain?"   (Scale 1-10; mild, moderate or severe)   - MILD (1-3): doesn't interfere with normal activities    - MODERATE (4-7): interferes with normal activities (e.g., work or school) or awakens from sleep   - SEVERE (8-10): excruciating pain and patient unable to do any normal activities        Feels worse with the sinuses than day of OV 03/20/21 4. FEVER: "Do you have a fever?" If Yes, ask: "What is it, how was it measured, and when did it start?"      Low grade yesterday none today 5. SYMPTOMS: "Are there any other symptoms you're concerned about?" If Yes, ask: "When did it start?"     Facial pressure and congestion 6. PREGNANCY: "Is there any chance you are pregnant?" "When was your last menstrual period?"     na  Protocols used: Sinus Infection on Antibiotic Follow-up Call-[facility]

## 2021-03-26 NOTE — Telephone Encounter (Addendum)
TC to the patient. Informed her Dr. Army Melia sent in another antibiotic and to callback with any questions.

## 2021-03-27 ENCOUNTER — Other Ambulatory Visit: Payer: Self-pay | Admitting: Internal Medicine

## 2021-03-27 DIAGNOSIS — Z20822 Contact with and (suspected) exposure to covid-19: Secondary | ICD-10-CM | POA: Diagnosis not present

## 2021-03-27 NOTE — Telephone Encounter (Signed)
Requested Prescriptions  Pending Prescriptions Disp Refills   levothyroxine (SYNTHROID) 88 MCG tablet [Pharmacy Med Name: Levothyroxine Sodium 88 MCG Oral Tablet] 90 tablet 0    Sig: TAKE 1 TABLET BY MOUTH  DAILY     Endocrinology:  Hypothyroid Agents Failed - 03/27/2021 10:24 PM      Failed - TSH needs to be rechecked within 3 months after an abnormal result. Refill until TSH is due.      Failed - TSH in normal range and within 360 days    TSH  Date Value Ref Range Status  03/28/2020 2.160 0.450 - 4.500 uIU/mL Final         Passed - Valid encounter within last 12 months    Recent Outpatient Visits          1 week ago Acute recurrent maxillary sinusitis   [facility] [name], MD   2 months ago Primary osteoarthritis of right knee   [facility] Montel Culver, MD   3 months ago Primary osteoarthritis of right knee   [facility] Montel Culver, MD   3 months ago Primary osteoarthritis of right knee   [facility] Montel Culver, MD   4 months ago Primary osteoarthritis of right knee   [facility] Montel Culver, MD      Future Appointments            In 4 days [name], MD [facility], [place]   In 2 months Army Melia Jesse Sans, MD [facility], [facility]

## 2021-03-27 NOTE — Telephone Encounter (Signed)
error 

## 2021-03-31 ENCOUNTER — Ambulatory Visit (INDEPENDENT_AMBULATORY_CARE_PROVIDER_SITE_OTHER): Payer: Medicare Other | Admitting: Internal Medicine

## 2021-03-31 ENCOUNTER — Other Ambulatory Visit: Payer: Self-pay

## 2021-03-31 ENCOUNTER — Encounter: Payer: Self-pay | Admitting: Internal Medicine

## 2021-03-31 VITALS — BP 112/78 | HR 81 | Ht 68.0 in | Wt 181.4 lb

## 2021-03-31 DIAGNOSIS — I10 Essential (primary) hypertension: Secondary | ICD-10-CM

## 2021-03-31 DIAGNOSIS — E890 Postprocedural hypothyroidism: Secondary | ICD-10-CM

## 2021-03-31 DIAGNOSIS — Z23 Encounter for immunization: Secondary | ICD-10-CM | POA: Diagnosis not present

## 2021-03-31 DIAGNOSIS — Z1231 Encounter for screening mammogram for malignant neoplasm of breast: Secondary | ICD-10-CM

## 2021-03-31 DIAGNOSIS — M8580 Other specified disorders of bone density and structure, unspecified site: Secondary | ICD-10-CM

## 2021-03-31 DIAGNOSIS — K219 Gastro-esophageal reflux disease without esophagitis: Secondary | ICD-10-CM | POA: Diagnosis not present

## 2021-03-31 DIAGNOSIS — I2510 Atherosclerotic heart disease of native coronary artery without angina pectoris: Secondary | ICD-10-CM

## 2021-03-31 DIAGNOSIS — F331 Major depressive disorder, recurrent, moderate: Secondary | ICD-10-CM | POA: Diagnosis not present

## 2021-03-31 DIAGNOSIS — M4696 Unspecified inflammatory spondylopathy, lumbar region: Secondary | ICD-10-CM | POA: Diagnosis not present

## 2021-03-31 LAB — POCT URINALYSIS DIPSTICK
Bilirubin, UA: NEGATIVE
Blood, UA: NEGATIVE
Glucose, UA: NEGATIVE
Ketones, UA: NEGATIVE
Leukocytes, UA: NEGATIVE
Nitrite, UA: NEGATIVE
Protein, UA: NEGATIVE
Spec Grav, UA: >=1.03 — AB (ref 1.010–1.025)
Urobilinogen, UA: 0.2 E.U./dL
pH, UA: 6 (ref 5.0–8.0)

## 2021-03-31 NOTE — Progress Notes (Signed)
Date:  03/31/2021   Name:  Carrie Myers   DOB:  May 01, 1945   MRN:  [id number]   Chief Complaint: Annual Exam (Medicare Yearly) Carrie Myers is a 76 y.o. female who presents today for her Complete Annual Exam. She feels fairly well. She reports exercising - none. She reports she is sleeping well. Breast complaints - none.  Mammogram: 05/2020 DEXA: 05/2020 osteopenia hip/normal spine Pap smear: discontinued Colonoscopy: 03/2018 repeat 10 yrs  Immunization History  Administered Date(s) Administered   Fluad Quad(high Dose 65+) 03/28/2019, 03/28/2020   Influenza, High Dose Seasonal PF 12/31/2016, 12/05/2017   Moderna Sars-Covid-2 Vaccination 11/09/2019, 12/13/2019   Pneumococcal Conjugate-13 01/30/2014   Pneumococcal Polysaccharide-23 09/22/2012   Tdap 09/08/2011   OSA - CPAP 11 cm H2O with oxygen - good compliance and good sleep.  However, she still has daytime sleepiness and takes Nuvigil. Fibromyalgia - on Cymbalta and Voltaren daily.   Gets Vicodin from pain management. Hypertension This is a chronic problem. The problem is controlled. Pertinent negatives include no chest pain, headaches, palpitations or shortness of breath. Past treatments include angiotensin blockers. The current treatment provides significant improvement. Hypertensive end-organ damage includes CAD/MI. There is no history of kidney disease or CVA. Identifiable causes of hypertension include a thyroid problem.  Hyperlipidemia This is a chronic problem. The problem is controlled. Associated symptoms include myalgias. Pertinent negatives include no chest pain or shortness of breath. Current antihyperlipidemic treatment includes statins. The current treatment provides significant improvement of lipids.  Gastroesophageal Reflux She reports no abdominal pain, no chest pain, no coughing or no wheezing. This is a recurrent problem. The problem occurs rarely. Pertinent negatives include no fatigue. She has tried a  PPI for the symptoms.  Thyroid Problem Presents for follow-up visit. Patient reports no anxiety, constipation, diarrhea, fatigue, palpitations or tremors. The symptoms have been stable. Her past medical history is significant for hyperlipidemia.   Lab Results  Component Value Date   NA 138 09/05/2020   K 4.8 09/05/2020   CO2 24 09/05/2020   GLUCOSE 85 09/05/2020   BUN 27 09/05/2020   CREATININE 0.94 09/05/2020   CALCIUM 9.6 09/05/2020   EGFR 64 09/05/2020   GFRNONAA 57 (L) 03/28/2020   Lab Results  Component Value Date   CHOL 147 03/28/2020   HDL 50 03/28/2020   LDLCALC 70 03/28/2020   TRIG 156 (H) 03/28/2020   CHOLHDL 2.9 03/28/2020   Lab Results  Component Value Date   TSH 2.160 03/28/2020   Lab Results  Component Value Date   HGBA1C 6.1 (H) 09/05/2020   Lab Results  Component Value Date   WBC 6.5 03/28/2020   HGB 13.2 03/28/2020   HCT 38.2 03/28/2020   MCV 88 03/28/2020   PLT 290 03/28/2020   Lab Results  Component Value Date   ALT 36 (H) 03/28/2020   AST 35 03/28/2020   ALKPHOS 80 03/28/2020   BILITOT 0.3 03/28/2020   No results found for: 25OHVITD2, 25OHVITD3, VD25OH   Review of Systems  Constitutional:  Negative for chills, fatigue and fever.  HENT:  Negative for congestion, hearing loss, tinnitus, trouble swallowing and voice change.   Eyes:  Negative for visual disturbance.  Respiratory:  Negative for cough, chest tightness, shortness of breath and wheezing.   Cardiovascular:  Negative for chest pain, palpitations and leg swelling.  Gastrointestinal:  Negative for abdominal pain, constipation, diarrhea and vomiting.  Endocrine: Negative for polydipsia and polyuria.  Genitourinary:  Negative for dysuria, frequency,  genital sores, vaginal bleeding and vaginal discharge.  Musculoskeletal:  Positive for back pain and myalgias. Negative for arthralgias, gait problem and joint swelling.  Skin:  Negative for color change and rash.  Neurological:  Negative  for dizziness, tremors, light-headedness and headaches.  Hematological:  Negative for adenopathy. Does not bruise/bleed easily.  Psychiatric/Behavioral:  Negative for dysphoric mood and sleep disturbance. The patient is not nervous/anxious.    Patient Active Problem List   Diagnosis Date Noted   Osteopenia determined by x-ray 03/31/2021   Primary osteoarthritis of left knee 01/22/2021   Effusion, right knee 12/15/2020   Primary osteoarthritis of right knee 11/24/2020   Overactive bladder 06/18/2020   Prediabetes 04/14/2020   Slow transit constipation 06/05/2019   Sciatica, left side 01/01/2019   Diverticulosis of colon 04/25/2018   Change in bowel habits    Tobacco use disorder, moderate, in sustained remission 12/22/2017   Moderate episode of recurrent major depressive disorder ([place]) 10/03/2017   Inflammatory spondylopathy of lumbar region ([place]) 10/03/2017   Hoarseness, persistent 04/18/2017   OSA on CPAP 02/18/2017   Cervical disc disease 12/31/2016   Generalized osteoarthritis 12/23/2016   Chronic pain syndrome 12/23/2016   Cystocele, midline 12/06/2016   Urinary incontinence 12/06/2016   Impingement syndrome of right shoulder 04/17/2015   Fibrocystic breast 01/18/2015   Gastroesophageal reflux disease without esophagitis 01/18/2015   Hypothyroidism, postablative 01/18/2015   Arteriosclerosis of coronary artery 12/29/2014   DJD of shoulder 11/13/2014   Fibromyalgia 10/02/2014   Intercostal neuralgia 10/02/2014   Facet syndrome, lumbar 10/02/2014   Sacroiliac joint disease 10/02/2014   Carotid artery narrowing 07/09/2014   Benign essential HTN 07/01/2014   Osteoarthritis of thumb 09/24/2012   Mixed hyperlipidemia 09/22/2012   Arthritis of hand, degenerative 09/22/2012    Allergies  Allergen Reactions   Augmentin [Amoxicillin-Pot Clavulanate] Nausea And Vomiting   Codeine Nausea And Vomiting   Sulfa Antibiotics Nausea And Vomiting   Proparacaine Itching   Shellfish  Allergy Nausea And Vomiting    No issues with iodine     Past Surgical History:  Procedure Laterality Date   ABDOMINAL HYSTERECTOMY     total   APPENDECTOMY     BREAST BIOPSY Right    neg   BREAST CYST ASPIRATION Left    neg   BUNIONECTOMY Bilateral    CESAREAN SECTION     COLONOSCOPY  2010   normal   COLONOSCOPY WITH PROPOFOL N/A 04/03/2018   Procedure: COLONOSCOPY WITH BIOPSIES;  Surgeon: Lucilla Lame, MD;  Location: [facility];  Service: Endoscopy;  Laterality: N/A;  sleep apnea   CORONARY ANGIOPLASTY WITH STENT PLACEMENT  04/2006   dental implant     [age]   DILATION AND CURETTAGE OF UTERUS     epidural steroid injection  2018   ESOPHAGOGASTRODUODENOSCOPY N/A 10/03/2020   Procedure: ESOPHAGOGASTRODUODENOSCOPY (EGD);  Surgeon: Lesly Rubenstein, MD;  Location[facility] ENDOSCOPY;  Service: Endoscopy;  Laterality: N/A;  REQUESTING MORNING AFTER 9 AM   EYE SURGERY     bilateral cataract extraction   SHOULDER ARTHROSCOPY Right 05/07/2015   Procedure: Extensive arthroscopic debridement and arthroscopic subacromial decompression, right shoulder.                        2.  Steroid injection right thumb CMC joint.;  Surgeon: Corky Mull, MD;  Location: [facility];  Service: Orthopedics;  Laterality: Right;   TOOTH EXTRACTION     Upper centrals and lateral  Social History   Tobacco Use   Smoking status: Former    Packs/day: 1.25    Years: 44.00    Pack years: 55.00    Types: Cigarettes    Quit date: 04/22/2006    Years since quitting: 14.9   Smokeless tobacco: Never   Tobacco comments:       Vaping Use   Vaping Use: Never used  Substance Use Topics   Alcohol use: Not Currently   Drug use: Never     Medication list has been reviewed and updated.  Current Meds  Medication Sig   Armodafinil 150 MG tablet Take 150 mg by mouth daily.   aspirin EC 81 MG tablet Take 81 mg by mouth daily.   cefUROXime (CEFTIN) 500 MG tablet Take 1 tablet (500 mg total)  by mouth 2 (two) times daily with a meal for 7 days.   citalopram (CELEXA) 20 MG tablet TAKE 1 TABLET BY MOUTH  DAILY   COMBIVENT RESPIMAT 20-100 MCG/ACT AERS respimat SMARTSIG:2 Inhalation Via Inhaler 4 Times Daily PRN   cyclobenzaprine (FLEXERIL) 10 MG tablet Take 10 mg by mouth 2 (two) times daily.   diazepam (VALIUM) 5 MG tablet Take 1 tablet (5 mg total) by mouth daily as needed.   diclofenac (VOLTAREN) 75 MG EC tablet TAKE (1) TABLET BY MOUTH TWICE DAILY   DULoxetine (CYMBALTA) 60 MG capsule TAKE 1 CAPSULE BY MOUTH  DAILY   HYDROcodone-acetaminophen (NORCO/VICODIN) 5-325 MG tablet Take 1 tablet by mouth every 6 (six) hours as needed for moderate pain or severe pain.   hydrocortisone 2.5 % cream Apply topically 2 (two) times daily as needed (Rash).   ketoconazole (NIZORAL) 2 % shampoo 1-2 times a week lather on scalp/forehead, leave on 5-8 minutes, rinse well   levocetirizine (XYZAL) 5 MG tablet Take by mouth.   levothyroxine (SYNTHROID) 88 MCG tablet TAKE 1 TABLET BY MOUTH  DAILY   Loperamide HCl (IMODIUM PO) Take 1 tablet by mouth as needed.   losartan (COZAAR) 50 MG tablet Take 100 mg by mouth daily.   mirabegron ER (MYRBETRIQ) 50 MG TB24 tablet Take 1 tablet (50 mg total) by mouth daily.   Multiple Vitamin (MULTI-VITAMINS) TABS Take by mouth.   NON FORMULARY at bedtime. CPAP @ 11 cm H2O with Oxygen   pantoprazole (PROTONIX) 40 MG tablet Take 1 tablet by mouth 2 (two) times daily.   pimecrolimus (ELIDEL) 1 % cream Apply BID to affected areas on face/eyelids   pravastatin (PRAVACHOL) 80 MG tablet Take 80 mg by mouth daily.   pregabalin (LYRICA) 50 MG capsule Take 50 mg by mouth 2 (two) times daily.   [DISCONTINUED] meloxicam (MOBIC) 7.5 MG tablet Take 1 tablet (7.5 mg total) by mouth daily as needed for pain.    PHQ 2/9 Scores 03/31/2021 03/20/2021 01/22/2021 12/15/2020  PHQ - 2 Score 0 0 0 0  PHQ- 9 Score 2 0 0 0  Exception Documentation - - - -    GAD 7 : Generalized Anxiety Score  03/31/2021 03/20/2021 01/22/2021 12/15/2020  Nervous, Anxious, on Edge 0 0 0 0  Control/stop worrying 0 0 0 0  Worry too much - different things 0 0 0 0  Trouble relaxing 0 0 0 0  Restless 0 0 0 0  Easily annoyed or irritable 0 0 0 0  Afraid - awful might happen 0 0 0 0  Total GAD 7 Score 0 0 0 0  Anxiety Difficulty Not difficult at all Not difficult at all  Not difficult at all Not difficult at all    BP Readings from Last 3 Encounters:  03/31/21 112/78  03/20/21 122/68  02/24/21 (!) 150/95    Physical Exam Vitals and nursing note reviewed.  Constitutional:      General: She is not in acute distress.    Appearance: She is well-developed.  HENT:     Head: Normocephalic and atraumatic.     Right Ear: Tympanic membrane and ear canal normal.     Left Ear: Tympanic membrane and ear canal normal.     Nose:     Right Sinus: Maxillary sinus tenderness present.     Left Sinus: Maxillary sinus tenderness present.  Eyes:     General: No scleral icterus.       Right eye: No discharge.        Left eye: No discharge.     Conjunctiva/sclera: Conjunctivae normal.  Neck:     Thyroid: No thyromegaly.     Vascular: No carotid bruit.  Cardiovascular:     Rate and Rhythm: Normal rate and regular rhythm. Occasional Extrasystoles are present.    Pulses: Normal pulses.     Heart sounds: Normal heart sounds.  Pulmonary:     Effort: Pulmonary effort is normal. No respiratory distress.     Breath sounds: Normal breath sounds and air entry. No wheezing.  Chest:  Breasts:    Right: No mass, nipple discharge, skin change or tenderness.     Left: No mass, nipple discharge, skin change or tenderness.  Abdominal:     General: Bowel sounds are normal.     Palpations: Abdomen is soft.     Tenderness: There is no abdominal tenderness.  Musculoskeletal:     Right hand: Deformity and bony tenderness present.     Left hand: Deformity and bony tenderness present.     Cervical back: Normal range of  motion. No erythema.     Lumbar back: No deformity or spasms. Decreased range of motion.     Right lower leg: No edema.     Left lower leg: No edema.  Lymphadenopathy:     Cervical: No cervical adenopathy.  Skin:    General: Skin is warm and dry.     Findings: No rash.  Neurological:     Mental Status: She is alert and oriented to person, place, and time.     Cranial Nerves: No cranial nerve deficit.     Sensory: No sensory deficit.     Deep Tendon Reflexes: Reflexes are normal and symmetric.  Psychiatric:        Attention and Perception: Attention normal.        Mood and Affect: Mood normal.    Wt Readings from Last 3 Encounters:  03/31/21 181 lb 6.4 oz (82.3 kg)  03/20/21 178 lb 3.2 oz (80.8 kg)  02/24/21 179 lb (81.2 kg)    BP 112/78    Pulse 81    Ht _0  (1.727 m)    Wt 181 lb 6.4 oz (82.3 kg)    SpO2 98%    BMI 27.58 kg/m   Assessment and Plan: 1. Benign essential HTN Clinically stable exam with well controlled BP. Tolerating medications without side effects at this time. Pt to continue current regimen and low sodium diet; benefits of regular exercise as able discussed. - CBC with Differential/Platelet - Comprehensive metabolic panel - POCT urinalysis dipstick  2. Encounter for screening mammogram for breast cancer Schedule at [facility] - MM 3D SCREEN  BREAST BILATERAL  3. Hypothyroidism, postablative supplemented - TSH + free T4  4. Arteriosclerosis of coronary artery Followed by Dr. Nehemiah Massed Had carotid US, ECHO and Nuclear stress test in December - all stable. Continue statin medication. - Lipid panel  5. Gastroesophageal reflux disease without esophagitis Symptoms well controlled on daily PPI No red flag signs such as weight loss, n/v, melena Will continue pantoprazole.  6. Inflammatory spondylopathy of lumbar region [facility]) Followed by Pain management Also taking Voltaren 75 mg bid, Lyrica  7. Moderate episode of recurrent major depressive disorder  (HCC) Clinically stable on current regimen with good control of symptoms, No SI or HI. Will continue current therapy.  8. Osteopenia determined by x-ray Recommend Calcium and vitamin D daily. Repeat DEXA in 2 years.   Partially dictated using Editor, commissioning. Any errors are unintentional.  Halina Maidens, MD [facility]  03/31/2021

## 2021-03-31 NOTE — Patient Instructions (Signed)
Vitamin D - 400 IU daily

## 2021-04-01 LAB — CBC WITH DIFFERENTIAL/PLATELET
Basophils Absolute: 0 10*3/uL (ref 0.0–0.2)
Basos: 1 %
EOS (ABSOLUTE): 0.1 10*3/uL (ref 0.0–0.4)
Eos: 3 %
Hematocrit: 37 % (ref 34.0–46.6)
Hemoglobin: 12.3 g/dL (ref 11.1–15.9)
Immature Grans (Abs): 0 10*3/uL (ref 0.0–0.1)
Immature Granulocytes: 1 %
Lymphocytes Absolute: 1.3 10*3/uL (ref 0.7–3.1)
Lymphs: 25 %
MCH: 29.4 pg (ref 26.6–33.0)
MCHC: 33.2 g/dL (ref 31.5–35.7)
MCV: 89 fL (ref 79–97)
Monocytes Absolute: 0.5 10*3/uL (ref 0.1–0.9)
Monocytes: 10 %
Neutrophils Absolute: 3.1 10*3/uL (ref 1.4–7.0)
Neutrophils: 60 %
Platelets: 261 10*3/uL (ref 150–450)
RBC: 4.18 x10E6/uL (ref 3.77–5.28)
RDW: 12.6 % (ref 11.7–15.4)
WBC: 5 10*3/uL (ref 3.4–10.8)

## 2021-04-01 LAB — COMPREHENSIVE METABOLIC PANEL
ALT: 26 IU/L (ref 0–32)
AST: 21 IU/L (ref 0–40)
Albumin/Globulin Ratio: 2.1 (ref 1.2–2.2)
Albumin: 4.2 g/dL (ref 3.7–4.7)
Alkaline Phosphatase: 85 IU/L (ref 44–121)
BUN/Creatinine Ratio: 23 (ref 12–28)
BUN: 24 mg/dL (ref 8–27)
Bilirubin Total: <0.2 mg/dL (ref 0.0–1.2)
CO2: 27 mmol/L (ref 20–29)
Calcium: 8.7 mg/dL (ref 8.7–10.3)
Chloride: 105 mmol/L (ref 96–106)
Creatinine, Ser: 1.03 mg/dL — ABNORMAL HIGH (ref 0.57–1.00)
Globulin, Total: 2 g/dL (ref 1.5–4.5)
Glucose: 86 mg/dL (ref 70–99)
Potassium: 5.4 mmol/L — ABNORMAL HIGH (ref 3.5–5.2)
Sodium: 142 mmol/L (ref 134–144)
Total Protein: 6.2 g/dL (ref 6.0–8.5)
eGFR: 57 mL/min/{1.73_m2} — ABNORMAL LOW (ref >59)

## 2021-04-01 LAB — TSH+FREE T4
Free T4: 1.27 ng/dL (ref 0.82–1.77)
TSH: 3.34 u[IU]/mL (ref 0.450–4.500)

## 2021-04-01 LAB — LIPID PANEL
Chol/HDL Ratio: 2.3 ratio (ref 0.0–4.4)
Cholesterol, Total: 136 mg/dL (ref 100–199)
HDL: 60 mg/dL (ref >39)
LDL Chol Calc (NIH): 60 mg/dL (ref 0–99)
Triglycerides: 85 mg/dL (ref 0–149)
VLDL Cholesterol Cal: 16 mg/dL (ref 5–40)

## 2021-04-09 ENCOUNTER — Telehealth: Payer: Self-pay

## 2021-04-09 DIAGNOSIS — J9611 Chronic respiratory failure with hypoxia: Secondary | ICD-10-CM | POA: Diagnosis not present

## 2021-04-09 NOTE — Telephone Encounter (Signed)
Called pt left VM as a reminder to call and schedule mammogram. [phone] ° °KP °

## 2021-04-20 ENCOUNTER — Other Ambulatory Visit: Payer: Self-pay | Admitting: Internal Medicine

## 2021-04-20 DIAGNOSIS — H18521 Epithelial (juvenile) corneal dystrophy, right eye: Secondary | ICD-10-CM | POA: Diagnosis not present

## 2021-04-21 NOTE — Telephone Encounter (Signed)
Requested Prescriptions  Pending Prescriptions Disp Refills   diclofenac (VOLTAREN) 75 MG EC tablet [Pharmacy Med Name: DICLOFENAC SODIUM 75 MG DR TAB] 60 tablet 0    Sig: TAKE (1) TABLET BY MOUTH TWICE DAILY     Analgesics:  NSAIDS Failed - 04/20/2021  8:32 AM      Failed - Manual Review: Labs are only required if the patient has taken medication for more than 8 weeks.      Failed - Cr in normal range and within 360 days    Creatinine, Ser  Date Value Ref Range Status  03/31/2021 1.03 (H) 0.57 - 1.00 mg/dL Final         Passed - HGB in normal range and within 360 days    Hemoglobin  Date Value Ref Range Status  03/31/2021 12.3 11.1 - 15.9 g/dL Final         Passed - PLT in normal range and within 360 days    Platelets  Date Value Ref Range Status  03/31/2021 261 150 - 450 x10E3/uL Final         Passed - HCT in normal range and within 360 days    Hematocrit  Date Value Ref Range Status  03/31/2021 37.0 34.0 - 46.6 % Final         Passed - eGFR is 30 or above and within 360 days    GFR calc Af Amer  Date Value Ref Range Status  03/28/2020 66 >59 mL/min/1.73 Final    Comment:    **In accordance with recommendations from the NKF-ASN Task force,**   [facility] is in the process of updating its eGFR calculation to the   2021 CKD-EPI creatinine equation that estimates kidney function   without a race variable.    GFR calc non Af Amer  Date Value Ref Range Status  03/28/2020 57 (L) >59 mL/min/1.73 Final   eGFR  Date Value Ref Range Status  03/31/2021 57 (L) >59 mL/min/1.73 Final         Passed - Patient is not pregnant      Passed - Valid encounter within last 12 months    Recent Outpatient Visits          3 weeks ago Benign essential HTN   [facility] [name], MD   1 month ago Acute recurrent maxillary sinusitis   [facility] [name], MD   2 months ago Primary osteoarthritis of right knee   [facility] Montel Culver, MD   4 months ago Primary osteoarthritis of right knee   [facility] Montel Culver, MD   4 months ago Primary osteoarthritis of right knee   [facility] Montel Culver, MD      Future Appointments            In 1 month Army Melia Jesse Sans, MD [facility], [place]   In 5 months Army Melia, Jesse Sans, MD [facility], [facility]

## 2021-05-13 ENCOUNTER — Ambulatory Visit: Payer: Medicare Other

## 2021-06-08 DIAGNOSIS — J9611 Chronic respiratory failure with hypoxia: Secondary | ICD-10-CM | POA: Diagnosis not present

## 2021-06-15 ENCOUNTER — Ambulatory Visit: Payer: Medicare Other | Admitting: Internal Medicine

## 2021-06-16 ENCOUNTER — Ambulatory Visit: Payer: Medicare Other | Admitting: Anesthesiology

## 2021-06-17 ENCOUNTER — Inpatient Hospital Stay: Admission: RE | Admit: 2021-06-17 | Payer: Medicare Other | Source: Ambulatory Visit

## 2021-06-22 ENCOUNTER — Ambulatory Visit: Payer: Medicare Other

## 2021-06-23 ENCOUNTER — Encounter: Payer: Self-pay | Admitting: Anesthesiology

## 2021-06-23 ENCOUNTER — Ambulatory Visit (HOSPITAL_BASED_OUTPATIENT_CLINIC_OR_DEPARTMENT_OTHER): Payer: Medicare Other | Admitting: Anesthesiology

## 2021-06-23 ENCOUNTER — Other Ambulatory Visit: Payer: Self-pay | Admitting: Anesthesiology

## 2021-06-23 ENCOUNTER — Ambulatory Visit
Admission: RE | Admit: 2021-06-23 | Discharge: 2021-06-23 | Disposition: A | Discharge status: Home / Self Care | Payer: Medicare Other | Source: Ambulatory Visit | Attending: Anesthesiology | Admitting: Anesthesiology

## 2021-06-23 VITALS — BP 138/90 | HR 93 | Temp 97.2°F | Resp 16 | Ht 68.0 in | Wt 188.0 lb

## 2021-06-23 DIAGNOSIS — M47812 Spondylosis without myelopathy or radiculopathy, cervical region: Secondary | ICD-10-CM | POA: Diagnosis present

## 2021-06-23 DIAGNOSIS — M797 Fibromyalgia: Secondary | ICD-10-CM | POA: Insufficient documentation

## 2021-06-23 DIAGNOSIS — M5442 Lumbago with sciatica, left side: Secondary | ICD-10-CM | POA: Insufficient documentation

## 2021-06-23 DIAGNOSIS — G8929 Other chronic pain: Secondary | ICD-10-CM | POA: Insufficient documentation

## 2021-06-23 DIAGNOSIS — M5441 Lumbago with sciatica, right side: Secondary | ICD-10-CM | POA: Diagnosis not present

## 2021-06-23 DIAGNOSIS — M542 Cervicalgia: Secondary | ICD-10-CM | POA: Diagnosis present

## 2021-06-23 DIAGNOSIS — G894 Chronic pain syndrome: Secondary | ICD-10-CM

## 2021-06-23 DIAGNOSIS — M5432 Sciatica, left side: Secondary | ICD-10-CM

## 2021-06-23 DIAGNOSIS — R52 Pain, unspecified: Secondary | ICD-10-CM | POA: Insufficient documentation

## 2021-06-23 DIAGNOSIS — M545 Low back pain, unspecified: Secondary | ICD-10-CM | POA: Insufficient documentation

## 2021-06-23 DIAGNOSIS — M5136 Other intervertebral disc degeneration, lumbar region: Secondary | ICD-10-CM

## 2021-06-23 DIAGNOSIS — F1190 Opioid use, unspecified, uncomplicated: Secondary | ICD-10-CM | POA: Diagnosis present

## 2021-06-23 DIAGNOSIS — M47816 Spondylosis without myelopathy or radiculopathy, lumbar region: Secondary | ICD-10-CM

## 2021-06-23 MED ORDER — TRIAMCINOLONE ACETONIDE 40 MG/ML IJ SUSP
INTRAMUSCULAR | Status: AC
  Filled 2021-06-23: qty 1

## 2021-06-23 MED ORDER — LIDOCAINE HCL (PF) 1 % IJ SOLN
INTRAMUSCULAR | Status: AC
  Filled 2021-06-23: qty 10

## 2021-06-23 MED ORDER — LIDOCAINE HCL (PF) 1 % IJ SOLN
5.0000 mL | Freq: Once | INTRAMUSCULAR | Status: AC
  Administered 2021-06-23: 5 mL via SUBCUTANEOUS

## 2021-06-23 MED ORDER — HYDROCODONE-ACETAMINOPHEN 5-325 MG PO TABS: 1.0000 | ORAL_TABLET | Freq: Two times a day (BID) | ORAL | 0 refills | Status: DC | PRN

## 2021-06-23 MED ORDER — IOPAMIDOL (ISOVUE-M 200) INJECTION 41%
20.0000 mL | Freq: Once | INTRAMUSCULAR | Status: DC | PRN
  Administered 2021-06-23: 20 mL

## 2021-06-23 MED ORDER — ROPIVACAINE HCL 2 MG/ML IJ SOLN
10.0000 mL | Freq: Once | INTRAMUSCULAR | Status: AC
  Administered 2021-06-23: 1 mL via EPIDURAL

## 2021-06-23 MED ORDER — SODIUM CHLORIDE (PF) 0.9 % IJ SOLN
INTRAMUSCULAR | Status: AC
  Filled 2021-06-23: qty 10

## 2021-06-23 MED ORDER — TRIAMCINOLONE ACETONIDE 40 MG/ML IJ SUSP
40.0000 mg | Freq: Once | INTRAMUSCULAR | Status: AC
  Administered 2021-06-23: 40 mg

## 2021-06-23 MED ORDER — ROPIVACAINE HCL 2 MG/ML IJ SOLN
INTRAMUSCULAR | Status: AC
  Filled 2021-06-23: qty 20

## 2021-06-23 MED ORDER — IOHEXOL 180 MG/ML  SOLN
INTRAMUSCULAR | Status: AC
  Filled 2021-06-23: qty 20

## 2021-06-23 MED ORDER — SODIUM CHLORIDE 0.9% FLUSH
10.0000 mL | Freq: Once | INTRAVENOUS | Status: AC
  Administered 2021-06-23: 5 mL

## 2021-06-23 NOTE — Progress Notes (Signed)
Nursing Pain Medication Assessment:  ?Safety precautions to be maintained throughout the outpatient stay will include: orient to surroundings, keep bed in low position, maintain call bell within reach at all times, provide assistance with transfer out of bed and ambulation.  ?Medication Inspection Compliance: Ms. Erkkila did not comply with our request to bring her pills to be counted. She was reminded that bringing the medication bottles, even when empty, is a requirement. ? ?Medication: None brought in. ?Pill/Patch Count: None available to be counted. ?Bottle Appearance: No container available. Did not bring bottle(s) to appointment. ?Filled Date: N/A ?Last Medication intake:  Today ?

## 2021-06-23 NOTE — Patient Instructions (Signed)
Pain Management Discharge Instructions  General Discharge Instructions :  If you need to reach your doctor call: Monday-Friday 8:00 am - 4:00 pm at [phone] or toll free [phone].  After clinic hours [phone] to have operator reach doctor.  Bring all of your medication bottles to all your appointments in the pain clinic.  To cancel or reschedule your appointment with Pain Management please remember to call 24 hours in advance to avoid a fee.  Refer to the educational materials which you have been given on: General Risks, I had my Procedure. Discharge Instructions, Post Sedation.  Post Procedure Instructions:  The drugs you were given will stay in your system until tomorrow, so for the next 24 hours you should not drive, make any legal decisions or drink any alcoholic beverages.  You may eat anything you prefer, but it is better to start with liquids then soups and crackers, and gradually work up to solid foods.  Please notify your doctor immediately if you have any unusual bleeding, trouble breathing or pain that is not related to your normal pain.  Depending on the type of procedure that was done, some parts of your body may feel week and/or numb.  This usually clears up by tonight or the next day.  Walk with the use of an assistive device or accompanied by an adult for the 24 hours.  You may use ice on the affected area for the first 24 hours.  Put ice in a Ziploc bag and cover with a towel and place against area 15 minutes on 15 minutes off.  You may switch to heat after 24 hours.Epidural Steroid Injection Patient Information  Description: The epidural space surrounds the nerves as they exit the spinal cord.  In some patients, the nerves can be compressed and inflamed by a bulging disc or a tight spinal canal (spinal stenosis).  By injecting steroids into the epidural space, we can bring irritated nerves into direct contact with a potentially helpful medication.  These  steroids act directly on the irritated nerves and can reduce swelling and inflammation which often leads to decreased pain.  Epidural steroids may be injected anywhere along the spine and from the neck to the low back depending upon the location of your pain.   After numbing the skin with local anesthetic (like Novocaine), a small needle is passed into the epidural space slowly.  You may experience a sensation of pressure while this is being done.  The entire block usually last less than 10 minutes.  Conditions which may be treated by epidural steroids:  Low back and leg pain Neck and arm pain Spinal stenosis Post-laminectomy syndrome Herpes zoster (shingles) pain Pain from compression fractures  Preparation for the injection:  Do not eat any solid food or dairy products within 8 hours of your appointment.  You may drink clear liquids up to 3 hours before appointment.  Clear liquids include water, black coffee, juice or soda.  No milk or cream please. You may take your regular medication, including pain medications, with a sip of water before your appointment  Diabetics should hold regular insulin (if taken separately) and take 1/2 normal NPH dos the morning of the procedure.  Carry some sugar containing items with you to your appointment. A driver must accompany you and be prepared to drive you home after your procedure.  Bring all your current medications with your. An IV may be inserted and sedation may be given at the discretion of the physician.     A blood pressure cuff, EKG and other monitors will often be applied during the procedure.  Some patients may need to have extra oxygen administered for a short period. You will be asked to provide medical information, including your allergies, prior to the procedure.  We must know immediately if you are taking blood thinners (like Coumadin/Warfarin)  Or if you are allergic to IV iodine contrast (dye). We must know if you could possible be  pregnant.  Possible side-effects: Bleeding from needle site Infection (rare, may require surgery) Nerve injury (rare) Numbness & tingling (temporary) Difficulty urinating (rare, temporary) Spinal headache ( a headache worse with upright posture) Light -headedness (temporary) Pain at injection site (several days) Decreased blood pressure (temporary) Weakness in arm/leg (temporary) Pressure sensation in back/neck (temporary)  Call if you experience: Fever/chills associated with headache or increased back/neck pain. Headache worsened by an upright position. New onset weakness or numbness of an extremity below the injection site Hives or difficulty breathing (go to the emergency room) Inflammation or drainage at the infection site Severe back/neck pain Any new symptoms which are concerning to you  Please note:  Although the local anesthetic injected can often make your back or neck feel good for several hours after the injection, the pain will likely return.  It takes 3-7 days for steroids to work in the epidural space.  You may not notice any pain relief for at least that one week.  If effective, we will often do a series of three injections spaced 3-6 weeks apart to maximally decrease your pain.  After the initial series, we generally will wait several months before considering a repeat injection of the same type.  If you have any questions, please call [phone] [facility] 

## 2021-06-24 NOTE — Progress Notes (Signed)
? ?Subjective:  ?Patient ID: Carrie Myers, female    DOB: 08-27-1945  Age: 76 y.o. MRN: [id number] ? ?CC: Back Pain (Left, lower) ? ? ?Procedure: L5-S1 epidural steroid under fluoroscopic guidance without sedation ? ?HPI ?Carrie Myers presents for reevaluation.  Carrie Myers was seen several months ago and continues to do well with her once a day Vicodin and Valium at bedtime to help with spasm and sleep.  This combination continues to work well for her.  Unfortunately she has had some recurrence of her sciatica symptoms.  She has responded favorably to epidurals in the past and desires to proceed with a repeat epidural injection today.  She continues to take her Flexeril do her stretching strengthening exercises in addition to physical therapy but this pain has persisted.  She rates her previous response about 75 to 80% regarding her leg and back pain generally going about 2 to 3 months. ? ?Outpatient Medications Prior to Visit  ?Medication Sig Dispense Refill  ? Armodafinil 150 MG tablet Take 150 mg by mouth daily.    ? aspirin EC 81 MG tablet Take 81 mg by mouth daily.    ? citalopram (CELEXA) 20 MG tablet TAKE 1 TABLET BY MOUTH  DAILY 90 tablet 3  ? COMBIVENT RESPIMAT 20-100 MCG/ACT AERS respimat SMARTSIG:2 Inhalation Via Inhaler 4 Times Daily PRN    ? cyclobenzaprine (FLEXERIL) 10 MG tablet Take 10 mg by mouth 2 (two) times daily.    ? diazepam (VALIUM) 5 MG tablet Take 1 tablet (5 mg total) by mouth daily as needed. 30 tablet 5  ? diclofenac (VOLTAREN) 75 MG EC tablet TAKE (1) TABLET BY MOUTH TWICE DAILY 60 tablet 2  ? DULoxetine (CYMBALTA) 60 MG capsule TAKE 1 CAPSULE BY MOUTH  DAILY 90 capsule 3  ? hydrocortisone 2.5 % cream Apply topically 2 (two) times daily as needed (Rash). 30 g 2  ? ketoconazole (NIZORAL) 2 % shampoo 1-2 times a week lather on scalp/forehead, leave on 5-8 minutes, rinse well 120 mL 3  ? levocetirizine (XYZAL) 5 MG tablet Take by mouth.    ? levothyroxine (SYNTHROID) 88 MCG tablet  TAKE 1 TABLET BY MOUTH  DAILY 90 tablet 0  ? Loperamide HCl (IMODIUM PO) Take 1 tablet by mouth as needed.    ? losartan (COZAAR) 50 MG tablet Take 100 mg by mouth daily.    ? mirabegron ER (MYRBETRIQ) 50 MG TB24 tablet Take 1 tablet (50 mg total) by mouth daily. 30 tablet 11  ? Multiple Vitamin (MULTI-VITAMINS) TABS Take by mouth.    ? NON FORMULARY at bedtime. CPAP @ 11 cm H2O with Oxygen    ? pantoprazole (PROTONIX) 40 MG tablet Take 1 tablet by mouth 2 (two) times daily.    ? pimecrolimus (ELIDEL) 1 % cream Apply BID to affected areas on face/eyelids 30 g 2  ? pravastatin (PRAVACHOL) 80 MG tablet Take 80 mg by mouth daily.    ? pregabalin (LYRICA) 50 MG capsule Take 50 mg by mouth 2 (two) times daily.    ? fluticasone-salmeterol (ADVAIR HFA) 115-21 MCG/ACT inhaler Inhale into the lungs. (Patient not taking: Reported on 06/23/2021)    ? HYDROcodone-acetaminophen (NORCO/VICODIN) 5-325 MG tablet Take 1 tablet by mouth every 6 (six) hours as needed for moderate pain or severe pain. 45 tablet 0  ? HYDROcodone-acetaminophen (NORCO/VICODIN) 5-325 MG tablet Take 1 tablet by mouth 2 (two) times daily as needed for moderate pain. 45 tablet 0  ? ?No facility-administered medications prior  to visit.  ? ? ?Review of Systems ?CNS: No confusion or sedation ?Cardiac: No angina or palpitations ?GI: No abdominal pain or constipation ?Constitutional: No nausea vomiting fevers or chills ? ?Objective:  ?BP 138/90   Pulse 93   Temp (!) 97.2 ?F (36.2 ?C) (Temporal)   Resp 16   Ht '5\' 8"'$  (1.727 m)   Wt 188 lb (85.3 kg)   SpO2 98%   BMI 28.59 kg/m?  ? ? ?BP Readings from Last 3 Encounters:  ?06/23/21 138/90  ?03/31/21 112/78  ?03/20/21 122/68  ? ? ? ?Wt Readings from Last 3 Encounters:  ?06/23/21 188 lb (85.3 kg)  ?03/31/21 181 lb 6.4 oz (82.3 kg)  ?03/20/21 178 lb 3.2 oz (80.8 kg)  ? ? ? ?Physical Exam ?Pt is alert and oriented ?PERRL EOMI ?HEART IS RRR no murmur or rub ?LCTA no wheezing or rales ?MUSCULOSKELETAL reveals some  paraspinous muscle tenderness but no overt trigger points.  Her lower extremity strength and function appear to be well preserved she walks with an antalgic gait and does have a positive straight leg raise on the left side.  She does present with some tenderness in the bilateral trapezius but no trigger points are noted she has some pain with extension greater than flexion at the left occipital joint.  Upper extremity strength appears to be well preserved with no evidence of deficit. ? ?Labs ? ?Lab Results  ?Component Value Date  ? HGBA1C 6.1 (H) 09/05/2020  ? HGBA1C 6.5 (H) 03/28/2020  ? ?Lab Results  ?Component Value Date  ? [place] 60 03/31/2021  ? CREATININE 1.03 (H) 03/31/2021  ? ? ?-------------------------------------------------------------------------------------------------------------------- ?Lab Results  ?Component Value Date  ? WBC 5.0 03/31/2021  ? HGB 12.3 03/31/2021  ? HCT 37.0 03/31/2021  ? PLT 261 03/31/2021  ? GLUCOSE 86 03/31/2021  ? CHOL 136 03/31/2021  ? TRIG 85 03/31/2021  ? HDL 60 03/31/2021  ? [place] 60 03/31/2021  ? ALT 26 03/31/2021  ? AST 21 03/31/2021  ? NA 142 03/31/2021  ? K 5.4 (H) 03/31/2021  ? CL 105 03/31/2021  ? CREATININE 1.03 (H) 03/31/2021  ? BUN 24 03/31/2021  ? CO2 27 03/31/2021  ? TSH 3.340 03/31/2021  ? HGBA1C 6.1 (H) 09/05/2020  ? ? ?--------------------------------------------------------------------------------------------------------------------- ?DG PAIN CLINIC C-ARM 1-60 MIN NO REPORT ? ?Result Date: 06/23/2021 ?Fluoro was used, but no Radiologist interpretation will be provided. Please refer to "NOTES" tab for provider progress note. ? ? ? ?Assessment & Plan:  ? ?Carrie Myers was seen today for back pain. ? ?Diagnoses and all orders for this visit: ? ?Chronic bilateral low back pain with bilateral sciatica ? ?DDD (degenerative disc disease), lumbar ? ?Fibromyalgia ? ?Chronic, continuous use of opioids ? ?Chronic bilateral low back pain without sciatica ? ?Facet syndrome,  lumbar ? ?Sciatica, left side ? ?Cervicalgia ? ?Chronic pain syndrome ? ?Cervical facet joint syndrome ? ?Other orders ?-     HYDROcodone-acetaminophen (NORCO/VICODIN) 5-325 MG tablet; Take 1 tablet by mouth 2 (two) times daily as needed for moderate pain. ?-     triamcinolone acetonide (KENALOG-40) injection 40 mg ?-     sodium chloride flush (NS) 0.9 % injection 10 mL ?-     ropivacaine (PF) 2 mg/mL (0.2%) (NAROPIN) injection 10 mL ?-     lidocaine (PF) (XYLOCAINE) 1 % injection 5 mL ?-     iopamidol (ISOVUE-M) 41 % intrathecal injection 20 mL ? ? ?   ? ? ?---------------------------------------------------------------------------------------------------------------------- ? ?Problem List Items Addressed  This Visit   ? ?  ? Unprioritized  ? Chronic pain syndrome (Chronic)  ? Relevant Medications  ? HYDROcodone-acetaminophen (NORCO/VICODIN) 5-325 MG tablet  ? Fibromyalgia (Chronic)  ? Relevant Medications  ? HYDROcodone-acetaminophen (NORCO/VICODIN) 5-325 MG tablet  ? Facet syndrome, lumbar  ? Relevant Medications  ? HYDROcodone-acetaminophen (NORCO/VICODIN) 5-325 MG tablet  ? Sciatica, left side  ? ?Other Visit Diagnoses   ? ? Chronic bilateral low back pain with bilateral sciatica    -  Primary  ? Relevant Medications  ? HYDROcodone-acetaminophen (NORCO/VICODIN) 5-325 MG tablet  ? triamcinolone acetonide (KENALOG-40) injection 40 mg (Completed)  ? ropivacaine (PF) 2 mg/mL (0.2%) (NAROPIN) injection 10 mL (Completed)  ? lidocaine (PF) (XYLOCAINE) 1 % injection 5 mL (Completed)  ? DDD (degenerative disc disease), lumbar      ? Relevant Medications  ? HYDROcodone-acetaminophen (NORCO/VICODIN) 5-325 MG tablet  ? triamcinolone acetonide (KENALOG-40) injection 40 mg (Completed)  ? Chronic, continuous use of opioids      ? Chronic bilateral low back pain without sciatica      ? Relevant Medications  ? HYDROcodone-acetaminophen (NORCO/VICODIN) 5-325 MG tablet  ? triamcinolone acetonide (KENALOG-40) injection 40 mg  (Completed)  ? ropivacaine (PF) 2 mg/mL (0.2%) (NAROPIN) injection 10 mL (Completed)  ? lidocaine (PF) (XYLOCAINE) 1 % injection 5 mL (Completed)  ? Cervicalgia      ? Cervical facet joint syndrome      ? ?  ? ? ? ? ?----

## 2021-06-29 ENCOUNTER — Ambulatory Visit (INDEPENDENT_AMBULATORY_CARE_PROVIDER_SITE_OTHER): Payer: Medicare Other

## 2021-06-29 DIAGNOSIS — Z0000 Encounter for general adult medical examination without abnormal findings: Secondary | ICD-10-CM

## 2021-06-29 DIAGNOSIS — Z20822 Contact with and (suspected) exposure to covid-19: Secondary | ICD-10-CM | POA: Diagnosis not present

## 2021-06-29 NOTE — Progress Notes (Signed)
? ?Subjective:  ? Carrie Myers is a 76 y.o. female who presents for Medicare Annual (Subsequent) preventive examination. ? ?Virtual Visit via Telephone Note ? ?I connected with  Carrie Myers on 06/29/21 at 11:30 AM EDT by telephone and verified that I am speaking with the correct person using two identifiers. ? ?Location: ?Patient: home ?Provider: [facility] ?Persons participating in the virtual visit: patient/Nurse Health Advisor ?  ?I discussed the limitations, risks, security and privacy concerns of performing an evaluation and management service by telephone and the availability of in person appointments. The patient expressed understanding and agreed to proceed. ? ?Interactive audio and video telecommunications were attempted between this nurse and patient, however failed, due to patient having technical difficulties OR patient did not have access to video capability.  We continued and completed visit with audio only. ? ?Some vital signs may be absent or patient reported.  ? ?Clemetine Marker, LPN ? ? ?Review of Systems    ? ?Cardiac Risk Factors include: advanced age (>37mn, >>34women);hypertension;dyslipidemia ? ?   ?Objective:  ?  ?Today's Vitals  ? 06/29/21 1123  ?PainSc: 5   ? ?There is no height or weight on file to calculate BMI. ? ? ?  06/29/2021  ? 11:31 AM 06/23/2021  ?  2:03 PM 12/31/2020  ? 11:25 AM 11/11/2020  ?  2:29 PM 10/03/2020  ? 10:25 AM 09/27/2020  ?  2:51 PM 05/12/2020  ?  1:44 PM  ?Advanced Directives  ?Does Patient Have a Medical Advance Directive? Yes Yes Yes Yes Yes No Yes  ?Type of [place]of [place] will  [place] will [place] will [place] will  [place] will  ?Does patient want to make changes to medical advance directive?   No - Patient declined      ?Copy of [place]? No - copy requested      No - copy requested   ? ? ?Current Medications (verified) ?Outpatient Encounter Medications as of 06/29/2021  ?Medication Sig  ? Armodafinil 150 MG tablet Take 150 mg by mouth daily. For fibromyalgia fatigue  ? aspirin EC 81 MG tablet Take 81 mg by mouth daily.  ? citalopram (CELEXA) 20 MG tablet TAKE 1 TABLET BY MOUTH  DAILY  ? cyclobenzaprine (FLEXERIL) 10 MG tablet Take 10 mg by mouth 2 (two) times daily.  ? diazepam (VALIUM) 5 MG tablet Take 1 tablet (5 mg total) by mouth daily as needed.  ? diclofenac (VOLTAREN) 75 MG EC tablet TAKE (1) TABLET BY MOUTH TWICE DAILY  ? DULoxetine (CYMBALTA) 60 MG capsule TAKE 1 CAPSULE BY MOUTH  DAILY  ? fluticasone-salmeterol (ADVAIR HFA) 115-21 MCG/ACT inhaler Inhale into the lungs 2 (two) times daily.  ? HYDROcodone-acetaminophen (NORCO/VICODIN) 5-325 MG tablet Take 1 tablet by mouth 2 (two) times daily as needed for moderate pain.  ? hydrocortisone 2.5 % cream Apply topically 2 (two) times daily as needed (Rash).  ? ketoconazole (NIZORAL) 2 % shampoo 1-2 times a week lather on scalp/forehead, leave on 5-8 minutes, rinse well  ? levocetirizine (XYZAL) 5 MG tablet Take by mouth.  ? levothyroxine (SYNTHROID) 88 MCG tablet TAKE 1 TABLET BY MOUTH  DAILY  ? Loperamide HCl (IMODIUM PO) Take 1 tablet by mouth as needed.  ? losartan (COZAAR) 50 MG tablet Take 100 mg by mouth daily.  ? mirabegron ER (MYRBETRIQ) 50 MG TB24 tablet Take 1 tablet (50  mg total) by mouth daily.  ? Multiple Vitamin (MULTI-VITAMINS) TABS Take by mouth.  ? NON FORMULARY at bedtime. CPAP @ 11 cm H2O with Oxygen  ? pantoprazole (PROTONIX) 40 MG tablet Take 1 tablet by mouth 2 (two) times daily.  ? pimecrolimus (ELIDEL) 1 % cream Apply BID to affected areas on face/eyelids  ? pravastatin (PRAVACHOL) 80 MG tablet Take 80 mg by mouth daily.  ? pregabalin (LYRICA) 50 MG capsule Take 50 mg by mouth 2 (two) times daily.  ? [DISCONTINUED] COMBIVENT RESPIMAT 20-100 MCG/ACT AERS respimat SMARTSIG:2 Inhalation Via Inhaler 4 Times Daily PRN  ?  [DISCONTINUED] HYDROcodone-acetaminophen (NORCO/VICODIN) 5-325 MG tablet Take 1 tablet by mouth every 6 (six) hours as needed for moderate pain or severe pain.  ? ?No facility-administered encounter medications on file as of 06/29/2021.  ? ? ?Allergies (verified) ?Augmentin [amoxicillin-pot clavulanate], Codeine, Sulfa antibiotics, Proparacaine, and Shellfish allergy  ? ?History: ?Past Medical History:  ?Diagnosis Date  ? Allergy   ? Anxiety   ? Arthritis   ? neck, hands, lower back  ? Bilateral dry eyes   ? Chronic low back pain 12/23/2016  ? Coronary artery disease   ? DDD (degenerative disc disease), lumbar 10/02/2014  ? Dental crowns present   ? Implants, front "flipper" while waiting for other implants  ? Emphysema of lung ([place])   ? Fibromyalgia   ? Fibromyalgia   ? Fracture of right foot 01/14/2015  ? GERD (gastroesophageal reflux disease)   ? History of Graves' disease   ? Hyperlipidemia   ? Hypertension   ? Hypothyroidism   ? Myocardial infarction ([place]) 04/22/2006  ? Osteoarthritis   ? Other shoulder lesions, right shoulder 04/17/2015  ? Rheumatoid arthritis ([place])   ? Sciatica, left side 01/01/2019  ? Sleep apnea   ? CPAP  ? Status post shoulder surgery 05/27/2015  ? Thyroid disease   ? Vertigo   ? none for approx 9 yrs  ? ?Past Surgical History:  ?Procedure Laterality Date  ? ABDOMINAL HYSTERECTOMY    ? total  ? APPENDECTOMY    ? BREAST BIOPSY Right   ? neg  ? BREAST CYST ASPIRATION Left   ? neg  ? BUNIONECTOMY Bilateral   ? CESAREAN SECTION    ? COLONOSCOPY  2010  ? normal  ? COLONOSCOPY WITH PROPOFOL N/A 04/03/2018  ? Procedure: COLONOSCOPY WITH BIOPSIES;  Surgeon: Lucilla Lame, MD;  Location: [place];  Service: Endoscopy;  Laterality: N/A;  sleep apnea  ? CORONARY ANGIOPLASTY WITH STENT PLACEMENT  04/2006  ? dental implant    ? seven  ? DILATION AND CURETTAGE OF UTERUS    ? epidural steroid injection  2018  ? ESOPHAGOGASTRODUODENOSCOPY N/A 10/03/2020  ? Procedure: ESOPHAGOGASTRODUODENOSCOPY  (EGD);  Surgeon: Lesly Rubenstein, MD;  Location[facility] ENDOSCOPY;  Service: Endoscopy;  Laterality: N/A;  REQUESTING MORNING AFTER 9 AM  ? EYE SURGERY    ? bilateral cataract extraction  ? SHOULDER ARTHROSCOPY Right 05/07/2015  ? Procedure: Extensive arthroscopic debridement and arthroscopic subacromial decompression, right shoulder.                        2.  Steroid injection right thumb CMC joint.;  Surgeon: Corky Mull, MD;  Location: [place];  Service: Orthopedics;  Laterality: Right;  ? TOOTH EXTRACTION    ? Upper centrals and lateral  ? ?Family History  ?Problem Relation Age of Onset  ? Stroke Mother   ?  Heart disease Father   ? Pancreatic cancer Sister   ? Diabetes Sister   ? Prostate cancer Brother   ? Breast cancer Neg Hx   ? Kidney cancer Neg Hx   ? Bladder Cancer Neg Hx   ? ?Social History  ? ?Socioeconomic History  ? Marital status: Widowed  ?  Spouse name: Not on file  ? Number of children: 1  ? Years of education: 4  ? Highest education level: High school graduate  ?Occupational History  ? Occupation: retired   ?Tobacco Use  ? Smoking status: Former  ?  Packs/day: 1.25  ?  Years: 44.00  ?  Pack years: 55.00  ?  Types: Cigarettes  ?  Quit date: 04/22/2006  ?  Years since quitting: 15.1  ? Smokeless tobacco: Never  ? Tobacco comments:  ?     ?Vaping Use  ? Vaping Use: Never used  ?Substance and Sexual Activity  ? Alcohol use: Not Currently  ? Drug use: Never  ? Sexual activity: Not Currently  ?  Partners: Male  ?Other Topics Concern  ? Not on file  ?Social History Narrative  ? Pt lives alone   ? ?Social Determinants of Health  ? ?Financial Resource Strain: Low Risk   ? Difficulty of Paying Living Expenses: Not hard at all  ?Food Insecurity: No Food Insecurity  ? Worried About Charity fundraiser in the Last Year: Never true  ? Ran Out of Food in the Last Year: Never true  ?Transportation Needs: No Transportation Needs  ? Lack of Transportation (Medical): No  ? Lack of Transportation  (Non-Medical): No  ?Physical Activity: Inactive  ? Days of Exercise per Week: 0 days  ? Minutes of Exercise per Session: 0 min  ?Stress: No Stress Concern Present  ? Feeling of Stress : Not at all  ?Social [employer]

## 2021-06-29 NOTE — Patient Instructions (Signed)
Ms. Chisum , ?Thank you for taking time to come for your Medicare Wellness Visit. I appreciate your ongoing commitment to your health goals. Please review the following plan we discussed and let me know if I can assist you in the future.  ? ?Screening recommendations/referrals: ?Colonoscopy: done 04/03/18. Repeat 03/2028 ?Mammogram: done 06/10/20. Please call [phone] to schedule your mammogram.  ?Bone Density: done 05/28/20.  ?Recommended yearly ophthalmology/optometry visit for glaucoma screening and checkup ?Recommended yearly dental visit for hygiene and checkup ? ?Vaccinations: ?Influenza vaccine: done 03/31/21 ?Pneumococcal vaccine: done 01/30/14 ?Tdap vaccine: done 09/08/11 ?Shingles vaccine: due   ?Covid-19:done 11/09/19 & 12/13/19 ? ?Advanced directives: Please bring a copy of your health care power of attorney and living will to the office at your convenience.  ? ?Conditions/risks identified: Recommend increasing physical activity  ? ?Next appointment: Follow up in one year for your annual wellness visit  ? ? ?Preventive Care 42 Years and Older, Female ?Preventive care refers to lifestyle choices and visits with your health care provider that can promote health and wellness. ?What does preventive care include? ?A yearly physical exam. This is also called an annual well check. ?Dental exams once or twice a year. ?Routine eye exams. Ask your health care provider how often you should have your eyes checked. ?Personal lifestyle choices, including: ?Daily care of your teeth and gums. ?Regular physical activity. ?Eating a healthy diet. ?Avoiding tobacco and drug use. ?Limiting alcohol use. ?Practicing safe sex. ?Taking low-dose aspirin every day. ?Taking vitamin and mineral supplements as recommended by your health care provider. ?What happens during an annual well check? ?The services and screenings done by your health care provider during your annual well check will depend on your age, overall health,  lifestyle risk factors, and family history of disease. ?Counseling  ?Your health care provider may ask you questions about your: ?Alcohol use. ?Tobacco use. ?Drug use. ?Emotional well-being. ?Home and relationship well-being. ?Sexual activity. ?Eating habits. ?History of falls. ?Memory and ability to understand (cognition). ?Work and work Statistician. ?Reproductive health. ?Screening  ?You may have the following tests or measurements: ?Height, weight, and BMI. ?Blood pressure. ?Lipid and cholesterol levels. These may be checked every 5 years, or more frequently if you are over 49 years old. ?Skin check. ?Lung cancer screening. You may have this screening every year starting at age 66 if you have a 30-pack-year history of smoking and currently smoke or have quit within the past 15 years. ?Fecal occult blood test (FOBT) of the stool. You may have this test every year starting at age 35. ?Flexible sigmoidoscopy or colonoscopy. You may have a sigmoidoscopy every 5 years or a colonoscopy every 10 years starting at age 52. ?Hepatitis C blood test. ?Hepatitis B blood test. ?Sexually transmitted disease (STD) testing. ?Diabetes screening. This is done by checking your blood sugar (glucose) after you have not eaten for a while (fasting). You may have this done every 1-3 years. ?Bone density scan. This is done to screen for osteoporosis. You may have this done starting at age 22. ?Mammogram. This may be done every 1-2 years. Talk to your health care provider about how often you should have regular mammograms. ?Talk with your health care provider about your test results, treatment options, and if necessary, the need for more tests. ?Vaccines  ?Your health care provider may recommend certain vaccines, such as: ?Influenza vaccine. This is recommended every year. ?Tetanus, diphtheria, and acellular pertussis (Tdap, Td) vaccine. You may need a Td booster every 10 years. ?  Zoster vaccine. You may need this after age  71. ?Pneumococcal 13-valent conjugate (PCV13) vaccine. One dose is recommended after age 53. ?Pneumococcal polysaccharide (PPSV23) vaccine. One dose is recommended after age 66. ?Talk to your health care provider about which screenings and vaccines you need and how often you need them. ?This information is not intended to replace advice given to you by your health care provider. Make sure you discuss any questions you have with your health care provider. ?Document Released: 03/28/2015 Document Revised: 11/19/2015 Document Reviewed: 12/31/2014 ?Elsevier Interactive Patient Education ? 2017 [place]. ? ?Fall Prevention in the Home ?Falls can cause injuries. They can happen to people of all ages. There are many things you can do to make your home safe and to help prevent falls. ?What can I do on the outside of my home? ?Regularly fix the edges of walkways and driveways and fix any cracks. ?Remove anything that might make you trip as you walk through a door, such as a raised step or threshold. ?Trim any bushes or trees on the path to your home. ?Use bright outdoor lighting. ?Clear any walking paths of anything that might make someone trip, such as rocks or tools. ?Regularly check to see if handrails are loose or broken. Make sure that both sides of any steps have handrails. ?Any raised decks and porches should have guardrails on the edges. ?Have any leaves, snow, or ice cleared regularly. ?Use sand or salt on walking paths during winter. ?Clean up any spills in your garage right away. This includes oil or grease spills. ?What can I do in the bathroom? ?Use night lights. ?Install grab bars by the toilet and in the tub and shower. Do not use towel bars as grab bars. ?Use non-skid mats or decals in the tub or shower. ?If you need to sit down in the shower, use a plastic, non-slip stool. ?Keep the floor dry. Clean up any water that spills on the floor as soon as it happens. ?Remove soap buildup in the tub or shower  regularly. ?Attach bath mats securely with double-sided non-slip rug tape. ?Do not have throw rugs and other things on the floor that can make you trip. ?What can I do in the bedroom? ?Use night lights. ?Make sure that you have a light by your bed that is easy to reach. ?Do not use any sheets or blankets that are too big for your bed. They should not hang down onto the floor. ?Have a firm chair that has side arms. You can use this for support while you get dressed. ?Do not have throw rugs and other things on the floor that can make you trip. ?What can I do in the kitchen? ?Clean up any spills right away. ?Avoid walking on wet floors. ?Keep items that you use a lot in easy-to-reach places. ?If you need to reach something above you, use a strong step stool that has a grab bar. ?Keep electrical cords out of the way. ?Do not use floor polish or wax that makes floors slippery. If you must use wax, use non-skid floor wax. ?Do not have throw rugs and other things on the floor that can make you trip. ?What can I do with my stairs? ?Do not leave any items on the stairs. ?Make sure that there are handrails on both sides of the stairs and use them. Fix handrails that are broken or loose. Make sure that handrails are as long as the stairways. ?Check any carpeting to make sure that  it is firmly attached to the stairs. Fix any carpet that is loose or worn. ?Avoid having throw rugs at the top or bottom of the stairs. If you do have throw rugs, attach them to the floor with carpet tape. ?Make sure that you have a light switch at the top of the stairs and the bottom of the stairs. If you do not have them, ask someone to add them for you. ?What else can I do to help prevent falls? ?Wear shoes that: ?Do not have high heels. ?Have rubber bottoms. ?Are comfortable and fit you well. ?Are closed at the toe. Do not wear sandals. ?If you use a stepladder: ?Make sure that it is fully opened. Do not climb a closed stepladder. ?Make sure that  both sides of the stepladder are locked into place. ?Ask someone to hold it for you, if possible. ?Clearly mark and make sure that you can see: ?Any grab bars or handrails. ?First and last steps. ?Where the edge of each step is

## 2021-07-17 ENCOUNTER — Other Ambulatory Visit: Payer: Self-pay | Admitting: Licensed Practical Nurse

## 2021-07-17 ENCOUNTER — Telehealth: Payer: Self-pay

## 2021-07-17 MED ORDER — MIRABEGRON ER 50 MG PO TB24: 50.0000 mg | ORAL_TABLET | Freq: Every day | ORAL | 11 refills | Status: DC

## 2021-07-17 NOTE — Telephone Encounter (Signed)
Called Carrie Myers she is aware she is due to come in office and is aware Dr Kenton Kingfisher is no longer with [place]. She was transferred to front office to make and annual with a provider. ? ?Per pt she needs her Myrbetriq 25 mg daily if not she will have very bad diarrhea. Can you look at this and give her a refill until she comes in office.  ? ?She stated she has some to last her until Monday.  ?

## 2021-07-17 NOTE — Telephone Encounter (Signed)
Carrie Myers called in asking for a refill of Myrbetriq 25 mg.  ?

## 2021-07-19 ENCOUNTER — Other Ambulatory Visit: Payer: Self-pay | Admitting: Internal Medicine

## 2021-07-19 DIAGNOSIS — F331 Major depressive disorder, recurrent, moderate: Secondary | ICD-10-CM

## 2021-07-19 NOTE — Progress Notes (Deleted)
PCP: [name], MD   No chief complaint on file.   HPI:      Carrie Myers is a 76 y.o. No obstetric history on file. whose LMP was No LMP recorded. Patient has had a hysterectomy., presents today for her MEDICARE annual examination.  Her menses are absent due to hyst. No pMB.   Hx of urinary frequency/urgecy. Did well with Detrol LA 4 mg but had to change to myrbetriq due to insurance.  Sex activity: {sex active: 315163}. She {does:18564} have vaginal dryness.  Last Pap: no loger indicated Hx of STDs: {STD hx:14358}  Last mammogram: 06/10/20  Results were: normal--routine follow-up in 12 months;  ordered by PCP There is no FH of breast cancer. There is no FH of ovarian cancer. The patient {does:18564} do self-breast exams.  Colonoscopy: 03/2018 at [facility]; was normal;  Repeat due after 10 years.   Tobacco use: {tob:20664} Alcohol use: {Alcohol:11675} No drug use Exercise: {exercise:31265}  She {does:18564} get adequate calcium and Vitamin D in her diet.  Labs with PCP.   Patient Active Problem List   Diagnosis Date Noted   Osteopenia determined by x-ray 03/31/2021   Primary osteoarthritis of left knee 01/22/2021   Effusion, right knee 12/15/2020   Primary osteoarthritis of right knee 11/24/2020   Overactive bladder 06/18/2020   Prediabetes 04/14/2020   Slow transit constipation 06/05/2019   Sciatica, left side 01/01/2019   Diverticulosis of colon 04/25/2018   Change in bowel habits    Tobacco use disorder, moderate, in sustained remission 12/22/2017   Moderate episode of recurrent major depressive disorder ([place]) 10/03/2017   Inflammatory spondylopathy of lumbar region ([place]) 10/03/2017   Hoarseness, persistent 04/18/2017   OSA on CPAP 02/18/2017   Cervical disc disease 12/31/2016   Generalized osteoarthritis 12/23/2016   Chronic pain syndrome 12/23/2016   Cystocele, midline 12/06/2016   Urinary incontinence 12/06/2016   Impingement syndrome  of right shoulder 04/17/2015   Fibrocystic breast 01/18/2015   Gastroesophageal reflux disease without esophagitis 01/18/2015   Hypothyroidism, postablative 01/18/2015   Arteriosclerosis of coronary artery 12/29/2014   DJD of shoulder 11/13/2014   Fibromyalgia 10/02/2014   Intercostal neuralgia 10/02/2014   Facet syndrome, lumbar 10/02/2014   Sacroiliac joint disease 10/02/2014   Carotid artery narrowing 07/09/2014   Benign essential HTN 07/01/2014   Osteoarthritis of thumb 09/24/2012   Mixed hyperlipidemia 09/22/2012   Arthritis of hand, degenerative 09/22/2012    Past Surgical History:  Procedure Laterality Date   ABDOMINAL HYSTERECTOMY     total   APPENDECTOMY     BREAST BIOPSY Right    neg   BREAST CYST ASPIRATION Left    neg   BUNIONECTOMY Bilateral    CESAREAN SECTION     COLONOSCOPY  2010   normal   COLONOSCOPY WITH PROPOFOL N/A 04/03/2018   Procedure: COLONOSCOPY WITH BIOPSIES;  Surgeon: Lucilla Lame, MD;  Location: [place];  Service: Endoscopy;  Laterality: N/A;  sleep apnea   CORONARY ANGIOPLASTY WITH STENT PLACEMENT  04/2006   dental implant     [age]   DILATION AND CURETTAGE OF UTERUS     epidural steroid injection  2018   ESOPHAGOGASTRODUODENOSCOPY N/A 10/03/2020   Procedure: ESOPHAGOGASTRODUODENOSCOPY (EGD);  Surgeon: Lesly Rubenstein, MD;  Location: [facility] ENDOSCOPY;  Service: Endoscopy;  Laterality: N/A;  REQUESTING MORNING AFTER 9 AM   EYE SURGERY     bilateral cataract extraction   SHOULDER ARTHROSCOPY Right 05/07/2015   Procedure: Extensive arthroscopic  debridement and arthroscopic subacromial decompression, right shoulder.                        2.  Steroid injection right thumb CMC joint.;  Surgeon: Corky Mull, MD;  Location: [place];  Service: Orthopedics;  Laterality: Right;   TOOTH EXTRACTION     Upper centrals and lateral    Family History  Problem Relation Age of Onset   Stroke Mother    Heart disease Father     Pancreatic cancer Sister    Diabetes Sister    Prostate cancer Brother    Breast cancer Neg Hx    Kidney cancer Neg Hx    Bladder Cancer Neg Hx     Social History   Socioeconomic History   Marital status: Widowed    Spouse name: Not on file   Number of children: 1   Years of education: 12   Highest education level: High school graduate  Occupational History   Occupation: retired   Tobacco Use   Smoking status: Former    Packs/day: 1.25    Years: 44.00    Pack years: 55.00    Types: Cigarettes    Quit date: 04/22/2006    Years since quitting: 15.2   Smokeless tobacco: Never   Tobacco comments:       Vaping Use   Vaping Use: Never used  Substance and Sexual Activity   Alcohol use: Not Currently   Drug use: Never   Sexual activity: Not Currently    Partners: Male  Other Topics Concern   Not on file  Social History Narrative   Pt lives alone    Social Determinants of Health   Financial Resource Strain: Low Risk    Difficulty of Paying Living Expenses: Not hard at all  Food Insecurity: No Food Insecurity   Worried About Charity fundraiser in the Last Year: Never true   [place] in the Last Year: Never true  Transportation Needs: No Transportation Needs   Lack of Transportation (Medical): No   Lack of Transportation (Non-Medical): No  Physical Activity: Inactive   Days of Exercise per Week: 0 days   Minutes of Exercise per Session: 0 min  Stress: No Stress Concern Present   Feeling of Stress : Not at all  Social Connections: Moderately Isolated   Frequency of Communication with Friends and Family: More than three times a week   Frequency of Social Gatherings with Friends and Family: Three times a week   Attends Religious Services: More than 4 times per year   Active Member of Clubs or Organizations: No   Attends Archivist Meetings: Never   Marital Status: Widowed  Human resources officer Violence: Not At Risk   Fear of Current or Ex-Partner: No    Emotionally Abused: No   Physically Abused: No   Sexually Abused: No     Current Outpatient Medications:    Armodafinil 150 MG tablet, Take 150 mg by mouth daily. For fibromyalgia fatigue, Disp: , Rfl:    aspirin EC 81 MG tablet, Take 81 mg by mouth daily., Disp: , Rfl:    citalopram (CELEXA) 20 MG tablet, TAKE 1 TABLET BY MOUTH  DAILY, Disp: 90 tablet, Rfl: 3   cyclobenzaprine (FLEXERIL) 10 MG tablet, Take 10 mg by mouth 2 (two) times daily., Disp: , Rfl:    diazepam (VALIUM) 5 MG tablet, Take 1 tablet (5 mg total) by mouth daily  as needed., Disp: 30 tablet, Rfl: 5   diclofenac (VOLTAREN) 75 MG EC tablet, TAKE (1) TABLET BY MOUTH TWICE DAILY, Disp: 60 tablet, Rfl: 2   DULoxetine (CYMBALTA) 60 MG capsule, TAKE 1 CAPSULE BY MOUTH  DAILY, Disp: 90 capsule, Rfl: 3   fluticasone-salmeterol (ADVAIR HFA) 115-21 MCG/ACT inhaler, Inhale into the lungs 2 (two) times daily., Disp: , Rfl:    HYDROcodone-acetaminophen (NORCO/VICODIN) 5-325 MG tablet, Take 1 tablet by mouth 2 (two) times daily as needed for moderate pain., Disp: 45 tablet, Rfl: 0   hydrocortisone 2.5 % cream, Apply topically 2 (two) times daily as needed (Rash)., Disp: 30 g, Rfl: 2   ketoconazole (NIZORAL) 2 % shampoo, 1-2 times a week lather on scalp/forehead, leave on 5-8 minutes, rinse well, Disp: 120 mL, Rfl: 3   levocetirizine (XYZAL) 5 MG tablet, Take by mouth., Disp: , Rfl:    levothyroxine (SYNTHROID) 88 MCG tablet, TAKE 1 TABLET BY MOUTH  DAILY, Disp: 90 tablet, Rfl: 0   Loperamide HCl (IMODIUM PO), Take 1 tablet by mouth as needed., Disp: , Rfl:    losartan (COZAAR) 50 MG tablet, Take 100 mg by mouth daily., Disp: , Rfl:    mirabegron ER (MYRBETRIQ) 50 MG TB24 tablet, Take 1 tablet (50 mg total) by mouth daily., Disp: 30 tablet, Rfl: 11   Multiple Vitamin (MULTI-VITAMINS) TABS, Take by mouth., Disp: , Rfl:    NON FORMULARY, at bedtime. CPAP @ 11 cm H2O with Oxygen, Disp: , Rfl:    pantoprazole (PROTONIX) 40 MG tablet, Take 1  tablet by mouth 2 (two) times daily., Disp: , Rfl:    pimecrolimus (ELIDEL) 1 % cream, Apply BID to affected areas on face/eyelids, Disp: 30 g, Rfl: 2   pravastatin (PRAVACHOL) 80 MG tablet, Take 80 mg by mouth daily., Disp: , Rfl:    pregabalin (LYRICA) 50 MG capsule, Take 50 mg by mouth 2 (two) times daily., Disp: , Rfl:      ROS:  Review of Systems BREAST: No symptoms    Objective: There were no vitals taken for this visit.   OBGyn Exam  Results: No results found for this or any previous visit (from the past 24 hour(s)).  Assessment/Plan:  No diagnosis found.   No orders of the defined types were placed in this encounter.           GYN counsel {counseling: 16159}    F/U  No follow-ups on file.  Carrie Myers B. Navy Belay, PA-C 07/19/2021 7:34 PM

## 2021-07-21 ENCOUNTER — Ambulatory Visit: Payer: Medicare Other | Admitting: Obstetrics and Gynecology

## 2021-07-21 ENCOUNTER — Other Ambulatory Visit: Payer: Self-pay | Admitting: Internal Medicine

## 2021-07-21 NOTE — Telephone Encounter (Signed)
Requested Prescriptions  ?Pending Prescriptions Disp Refills  ?? DULoxetine (CYMBALTA) 60 MG capsule [Pharmacy Med Name: DULoxetine HCl 60 MG Oral Capsule Delayed Release Particles] 90 capsule 1  ?  Sig: TAKE 1 CAPSULE BY MOUTH  DAILY  ?  ? Psychiatry: Antidepressants - SNRI - duloxetine Failed - 07/19/2021 11:26 PM  ?  ?  Failed - Cr in normal range and within 360 days  ?  Creatinine, Ser  ?Date Value Ref Range Status  ?03/31/2021 1.03 (H) 0.57 - 1.00 mg/dL Final  ?   ?  ?  Failed - Last BP in normal range  ?  BP Readings from Last 1 Encounters:  ?06/23/21 138/90  ?   ?  ?  Passed - eGFR is 30 or above and within 360 days  ?  GFR calc Af Amer  ?Date Value Ref Range Status  ?03/28/2020 66 >59 mL/min/1.73 Final  ?  Comment:  ?  **In accordance with recommendations from the NKF-ASN Task force,** ?  [facility] is in the process of updating its eGFR calculation to the ?  2021 CKD-EPI creatinine equation that estimates kidney function ?  without a race variable. ?  ? ?GFR calc non Af Amer  ?Date Value Ref Range Status  ?03/28/2020 57 (L) >59 mL/min/1.73 Final  ? ?eGFR  ?Date Value Ref Range Status  ?03/31/2021 57 (L) >59 mL/min/1.73 Final  ?   ?  ?  Passed - Completed PHQ-2 or PHQ-9 in the last 360 days  ?  ?  Passed - Valid encounter within last 6 months  ?  Recent Outpatient Visits   ?      ? 3 months ago Benign essential HTN  ? [facility] Berglund, Laura H, MD  ? 4 months ago Acute recurrent maxillary sinusitis  ? [facility] Berglund, Laura H, MD  ? 6 months ago Primary osteoarthritis of right knee  ? [facility] Matthews, Jason J, MD  ? 7 months ago Primary osteoarthritis of right knee  ? [facility] Matthews, Jason J, MD  ? 7 months ago Primary osteoarthritis of right knee  ? [facility] Matthews, Jason J, MD  ?  ?  ?Future Appointments   ?        ? In 2 months Berglund, Laura H, MD [facility]  ?  ? ?  ?  ?  ? ? ?

## 2021-07-22 NOTE — Telephone Encounter (Signed)
Requested Prescriptions  ?Pending Prescriptions Disp Refills  ?? diclofenac (VOLTAREN) 75 MG EC tablet [Pharmacy Med Name: DICLOFENAC SODIUM 75 MG DR TAB] 60 tablet 2  ?  Sig: TAKE (1) TABLET BY MOUTH TWICE DAILY  ?  ? Analgesics:  NSAIDS Failed - 07/21/2021  1:49 PM  ?  ?  Failed - Manual Review: Labs are only required if the patient has taken medication for more than 8 weeks.  ?  ?  Failed - Cr in normal range and within 360 days  ?  Creatinine, Ser  ?Date Value Ref Range Status  ?03/31/2021 1.03 (H) 0.57 - 1.00 mg/dL Final  ?   ?  ?  Passed - HGB in normal range and within 360 days  ?  Hemoglobin  ?Date Value Ref Range Status  ?03/31/2021 12.3 11.1 - 15.9 g/dL Final  ?   ?  ?  Passed - PLT in normal range and within 360 days  ?  Platelets  ?Date Value Ref Range Status  ?03/31/2021 261 150 - 450 x10E3/uL Final  ?   ?  ?  Passed - HCT in normal range and within 360 days  ?  Hematocrit  ?Date Value Ref Range Status  ?03/31/2021 37.0 34.0 - 46.6 % Final  ?   ?  ?  Passed - eGFR is 30 or above and within 360 days  ?  GFR calc Af Amer  ?Date Value Ref Range Status  ?03/28/2020 66 >59 mL/min/1.73 Final  ?  Comment:  ?  **In accordance with recommendations from the NKF-ASN Task force,** ?  [facility] is in the process of updating its eGFR calculation to the ?  2021 CKD-EPI creatinine equation that estimates kidney function ?  without a race variable. ?  ? ?GFR calc non Af Amer  ?Date Value Ref Range Status  ?03/28/2020 57 (L) >59 mL/min/1.73 Final  ? ?eGFR  ?Date Value Ref Range Status  ?03/31/2021 57 (L) >59 mL/min/1.73 Final  ?   ?  ?  Passed - Patient is not pregnant  ?  ?  Passed - Valid encounter within last 12 months  ?  Recent Outpatient Visits   ?      ? 3 months ago Benign essential HTN  ? [facility] [name], MD  ? 4 months ago Acute recurrent maxillary sinusitis  ? [facility] [name], MD  ? 6 months ago Primary osteoarthritis of right knee  ? [facility] Montel Culver, MD  ? 7 months ago Primary osteoarthritis of right knee  ? [facility] Montel Culver, MD  ? 7 months ago Primary osteoarthritis of right knee  ? [facility] Montel Culver, MD  ?  ?  ?Future Appointments   ?        ? In 2 months Army Melia Jesse Sans, MD [facility] [place], [place]  ?  ? ?  ?  ?  ? ?

## 2021-08-13 ENCOUNTER — Telehealth: Payer: Self-pay | Admitting: Acute Care

## 2021-08-13 NOTE — Telephone Encounter (Signed)
Attempted to reach pt to schedule LDCT scan.  Pt notes she has quit smoking > 15 yrs which makes her no longer eligible for the program.    However if you'd like to continue scans, consider ordering CT scan without contrast.  Thank you

## 2021-08-24 ENCOUNTER — Other Ambulatory Visit: Payer: Self-pay | Admitting: Internal Medicine

## 2021-08-24 DIAGNOSIS — M797 Fibromyalgia: Secondary | ICD-10-CM

## 2021-08-25 NOTE — Telephone Encounter (Signed)
Requested medication (s) are due for refill today:   Requested medication (s) are on the active medication list: No  Last refill:  D/C  02/17/21.  Future visit scheduled: Yes  Notes to clinic:  Not delegated, but states it was d/c.    Requested Prescriptions  Pending Prescriptions Disp Refills   Diclofenac-miSOPROStol 75-0.2 MG TBEC [Pharmacy Med Name: DICLOFENAC-MISOPROSTOL 75-0.2 MG DR] 180 tablet 1    Sig: TAKE (1) TABLET BY MOUTH TWICE DAILY     Not Delegated - Analgesics:  Antirheumatic Agents - diclofenac/misoprostol Failed - 08/24/2021  3:37 PM      Failed - This refill cannot be delegated      Failed - Cr in normal range and within 360 days    Creatinine, Ser  Date Value Ref Range Status  03/31/2021 1.03 (H) 0.57 - 1.00 mg/dL Final         Passed - HGB in normal range and within 360 days    Hemoglobin  Date Value Ref Range Status  03/31/2021 12.3 11.1 - 15.9 g/dL Final         Passed - Patient is not pregnant      Passed - Valid encounter within last 12 months    Recent Outpatient Visits           4 months ago Benign essential HTN   [facility] [name], MD   5 months ago Acute recurrent maxillary sinusitis   [facility] [name], MD   7 months ago Primary osteoarthritis of right knee   [facility] Montel Culver, MD   8 months ago Primary osteoarthritis of right knee   [facility] Montel Culver, MD   8 months ago Primary osteoarthritis of right knee   [facility] Montel Culver, MD       Future Appointments             In 1 month Army Melia, Jesse Sans, MD [facility]

## 2021-09-08 ENCOUNTER — Ambulatory Visit: Payer: Medicare Other | Admitting: Obstetrics and Gynecology

## 2021-09-22 ENCOUNTER — Encounter: Payer: Self-pay | Admitting: Anesthesiology

## 2021-09-22 ENCOUNTER — Other Ambulatory Visit: Payer: Self-pay | Admitting: Internal Medicine

## 2021-09-22 ENCOUNTER — Ambulatory Visit: Payer: Medicare Other | Attending: Anesthesiology | Admitting: Anesthesiology

## 2021-09-22 DIAGNOSIS — G8929 Other chronic pain: Secondary | ICD-10-CM

## 2021-09-22 DIAGNOSIS — M5441 Lumbago with sciatica, right side: Secondary | ICD-10-CM

## 2021-09-22 DIAGNOSIS — M5442 Lumbago with sciatica, left side: Secondary | ICD-10-CM | POA: Diagnosis not present

## 2021-09-22 DIAGNOSIS — M545 Low back pain, unspecified: Secondary | ICD-10-CM | POA: Diagnosis not present

## 2021-09-22 DIAGNOSIS — M47816 Spondylosis without myelopathy or radiculopathy, lumbar region: Secondary | ICD-10-CM

## 2021-09-22 DIAGNOSIS — I2510 Atherosclerotic heart disease of native coronary artery without angina pectoris: Secondary | ICD-10-CM

## 2021-09-22 DIAGNOSIS — M5136 Other intervertebral disc degeneration, lumbar region: Secondary | ICD-10-CM | POA: Diagnosis not present

## 2021-09-22 DIAGNOSIS — G894 Chronic pain syndrome: Secondary | ICD-10-CM

## 2021-09-22 DIAGNOSIS — M797 Fibromyalgia: Secondary | ICD-10-CM

## 2021-09-22 DIAGNOSIS — F1190 Opioid use, unspecified, uncomplicated: Secondary | ICD-10-CM

## 2021-09-22 DIAGNOSIS — M47812 Spondylosis without myelopathy or radiculopathy, cervical region: Secondary | ICD-10-CM

## 2021-09-22 DIAGNOSIS — M542 Cervicalgia: Secondary | ICD-10-CM

## 2021-09-22 DIAGNOSIS — M5432 Sciatica, left side: Secondary | ICD-10-CM

## 2021-09-22 MED ORDER — HYDROCODONE-ACETAMINOPHEN 5-325 MG PO TABS: 1.0000 | ORAL_TABLET | Freq: Two times a day (BID) | ORAL | 0 refills | Status: DC | PRN

## 2021-09-22 MED ORDER — DIAZEPAM 5 MG PO TABS: 5.0000 mg | ORAL_TABLET | Freq: Every day | ORAL | 2 refills | Status: AC | PRN

## 2021-09-22 NOTE — Progress Notes (Signed)
Virtual Visit via Telephone Note  I connected with Carrie Myers on 09/22/21 at 11:00 AM EDT by telephone and verified that I am speaking with the correct person using two identifiers.  Location: Patient: Home Provider: Pain control center   I discussed the limitations, risks, security and privacy concerns of performing an evaluation and management service by telephone and the availability of in person appointments. I also discussed with the patient that there may be a patient responsible charge related to this service. The patient expressed understanding and agreed to proceed.   History of Present Illness: I spoke with Carrie Myers via telephone as we were unable link for the video portion of the conference.  She still having some low back pain and occasional sciatica symptoms in the leg but much less severe than prior to her most recent epidural back in the spring.  She is staying active and trying to do her exercises.  The pain that she does experience in the leg is less severe and does not radiate down her leg to the same extent as prior to the epidural.  She has responded favorably to these.  She still taking her medications as well.  She still takes hydrocodone approximately 1 or 2 tablets/day as needed to help with her back pain and to help with muscle spasms and sleep at night she does take 5 mg of Valium at bedtime.  She knows not to use these concomitantly.  No side effects are reported with her medication and otherwise no change in lower extremity strength function or bowel or bladder function and she feels like she is doing well and getting good relief with her medication therapy.  Review of systems: General: No fevers or chills Pulmonary: No shortness of breath or dyspnea Cardiac: No angina or palpitations or lightheadedness GI: No abdominal pain or constipation Psych: No depression    Observations/Objective:   Current Outpatient Medications:    [START ON  10/23/2021] HYDROcodone-acetaminophen (NORCO/VICODIN) 5-325 MG tablet, Take 1 tablet by mouth 2 (two) times daily as needed for moderate pain or severe pain., Disp: 45 tablet, Rfl: 0   Armodafinil 150 MG tablet, Take 150 mg by mouth daily. For fibromyalgia fatigue, Disp: , Rfl:    aspirin EC 81 MG tablet, Take 81 mg by mouth daily., Disp: , Rfl:    citalopram (CELEXA) 20 MG tablet, TAKE 1 TABLET BY MOUTH  DAILY, Disp: 90 tablet, Rfl: 3   cyclobenzaprine (FLEXERIL) 10 MG tablet, Take 10 mg by mouth 2 (two) times daily., Disp: , Rfl:    diazepam (VALIUM) 5 MG tablet, Take 1 tablet (5 mg total) by mouth daily as needed., Disp: 30 tablet, Rfl: 2   diclofenac (VOLTAREN) 75 MG EC tablet, TAKE (1) TABLET BY MOUTH TWICE DAILY, Disp: 60 tablet, Rfl: 2   Diclofenac-miSOPROStol 75-0.2 MG TBEC, TAKE (1) TABLET BY MOUTH TWICE DAILY, Disp: 60 tablet, Rfl: 0   DULoxetine (CYMBALTA) 60 MG capsule, TAKE 1 CAPSULE BY MOUTH  DAILY, Disp: 90 capsule, Rfl: 1   fluticasone-salmeterol (ADVAIR HFA) 115-21 MCG/ACT inhaler, Inhale into the lungs 2 (two) times daily., Disp: , Rfl:    HYDROcodone-acetaminophen (NORCO/VICODIN) 5-325 MG tablet, Take 1 tablet by mouth 2 (two) times daily as needed for moderate pain., Disp: 45 tablet, Rfl: 0   hydrocortisone 2.5 % cream, Apply topically 2 (two) times daily as needed (Rash)., Disp: 30 g, Rfl: 2   ketoconazole (NIZORAL) 2 % shampoo, 1-2 times a week lather on scalp/forehead, leave on  5-8 minutes, rinse well, Disp: 120 mL, Rfl: 3   levocetirizine (XYZAL) 5 MG tablet, Take by mouth., Disp: , Rfl:    levothyroxine (SYNTHROID) 88 MCG tablet, TAKE 1 TABLET BY MOUTH  DAILY, Disp: 90 tablet, Rfl: 0   Loperamide HCl (IMODIUM PO), Take 1 tablet by mouth as needed., Disp: , Rfl:    losartan (COZAAR) 50 MG tablet, Take 100 mg by mouth daily., Disp: , Rfl:    mirabegron ER (MYRBETRIQ) 50 MG TB24 tablet, Take 1 tablet (50 mg total) by mouth daily., Disp: 30 tablet, Rfl: 11   Multiple Vitamin  (MULTI-VITAMINS) TABS, Take by mouth., Disp: , Rfl:    NON FORMULARY, at bedtime. CPAP @ 11 cm H2O with Oxygen, Disp: , Rfl:    pantoprazole (PROTONIX) 40 MG tablet, Take 1 tablet by mouth 2 (two) times daily., Disp: , Rfl:    pimecrolimus (ELIDEL) 1 % cream, Apply BID to affected areas on face/eyelids, Disp: 30 g, Rfl: 2   pravastatin (PRAVACHOL) 80 MG tablet, Take 80 mg by mouth daily., Disp: , Rfl:    pregabalin (LYRICA) 50 MG capsule, Take 50 mg by mouth 2 (two) times daily., Disp: , Rfl:    Past Medical History:  Diagnosis Date   Allergy    Anxiety    Arthritis    neck, hands, lower back   Bilateral dry eyes    Chronic low back pain 12/23/2016   Coronary artery disease    DDD (degenerative disc disease), lumbar 10/02/2014   Dental crowns present    Implants, front "flipper" while waiting for other implants   Emphysema of lung ([place])    Fibromyalgia    Fibromyalgia    Fracture of right foot 01/14/2015   GERD (gastroesophageal reflux disease)    History of Graves' disease    Hyperlipidemia    Hypertension    Hypothyroidism    Myocardial infarction ([place]) 04/22/2006   Osteoarthritis    Other shoulder lesions, right shoulder 04/17/2015   Rheumatoid arthritis ([place])    Sciatica, left side 01/01/2019   Sleep apnea    CPAP   Status post shoulder surgery 05/27/2015   Thyroid disease    Vertigo    none for approx 9 yrs    Assessment and Plan: 1. Chronic bilateral low back pain with bilateral sciatica   2. DDD (degenerative disc disease), lumbar   3. Chronic, continuous use of opioids   4. Chronic bilateral low back pain without sciatica   5. Fibromyalgia   6. Facet syndrome, lumbar   7. Chronic pain syndrome   8. Cervicalgia   9. Sciatica, left side   10. Cervical facet joint syndrome   Based on discussion today having is appropriate to refill her medicines.  I have reviewed the [facility] practitioner database information.  Her hydrocodone will be filled for today and  1 month from today.  Also refill her Valium for 5 mg at bedtime.  She knows not to use these concomitantly.  We will schedule her for return to clinic in 3 months.  I encouraged her to continue with the stretching strengthening exercises and should she need [place] in the meantime to contact the pain control center for she would be a candidate for repeat epidural if indicated.  Continue follow-up with her primary care physicians for baseline medical care.  She can start magnesium 250 mg once a day as discussed with her today to help with some of the muscle spasms.  Follow Up Instructions:  I discussed the assessment and treatment plan with the patient. The patient was provided an opportunity to ask questions and all were answered. The patient agreed with the plan and demonstrated an understanding of the instructions.   The patient was advised to call back or seek an in-person evaluation if the symptoms worsen or if the condition fails to improve as anticipated.  I provided 30 minutes of non-face-to-face time during this encounter.   Molli Barrows, MD

## 2021-09-23 NOTE — Telephone Encounter (Signed)
Requested medication (s) are due for refill today -yes  Requested medication (s) are on the active medication list -yes  Future visit scheduled -yes  Last refill: 08/25/21 #60  Notes to clinic: non delegated Rx  Requested Prescriptions  Pending Prescriptions Disp Refills   Diclofenac-miSOPROStol 75-0.2 MG TBEC [Pharmacy Med Name: DICLOFENAC-MISOPROSTOL 75-0.2 MG DR] 60 tablet 0    Sig: TAKE (1) TABLET BY MOUTH TWICE DAILY     Not Delegated - Analgesics:  Antirheumatic Agents - diclofenac/misoprostol Failed - 09/22/2021  1:34 PM      Failed - This refill cannot be delegated      Failed - Cr in normal range and within 360 days    Creatinine, Ser  Date Value Ref Range Status  03/31/2021 1.03 (H) 0.57 - 1.00 mg/dL Final         Passed - HGB in normal range and within 360 days    Hemoglobin  Date Value Ref Range Status  03/31/2021 12.3 11.1 - 15.9 g/dL Final         Passed - Patient is not pregnant      Passed - Valid encounter within last 12 months    Recent Outpatient Visits           5 months ago Benign essential HTN   [facility] [name], MD   6 months ago Acute recurrent maxillary sinusitis   [facility] [name], MD   8 months ago Primary osteoarthritis of right knee   [facility] Montel Culver, MD   9 months ago Primary osteoarthritis of right knee   [facility] Montel Culver, MD   9 months ago Primary osteoarthritis of right knee   [facility] Montel Culver, MD       Future Appointments             In 5 days [name], MD [facility], [facility]               Requested Prescriptions  Pending Prescriptions Disp Refills   Diclofenac-miSOPROStol 75-0.2 MG TBEC [Pharmacy Med Name: DICLOFENAC-MISOPROSTOL 75-0.2 MG DR] 60 tablet 0    Sig: TAKE (1) TABLET BY MOUTH TWICE DAILY     Not Delegated - Analgesics:  Antirheumatic Agents - diclofenac/misoprostol Failed -  09/22/2021  1:34 PM      Failed - This refill cannot be delegated      Failed - Cr in normal range and within 360 days    Creatinine, Ser  Date Value Ref Range Status  03/31/2021 1.03 (H) 0.57 - 1.00 mg/dL Final         Passed - HGB in normal range and within 360 days    Hemoglobin  Date Value Ref Range Status  03/31/2021 12.3 11.1 - 15.9 g/dL Final         Passed - Patient is not pregnant      Passed - Valid encounter within last 12 months    Recent Outpatient Visits           5 months ago Benign essential HTN   [facility] [name], MD   6 months ago Acute recurrent maxillary sinusitis   [facility] [name], MD   8 months ago Primary osteoarthritis of right knee   [facility] Montel Culver, MD   9 months ago Primary osteoarthritis of right knee   [facility] [place],  Earley Abide, MD   9 months ago Primary osteoarthritis of right knee   [facility] Montel Culver, MD       Future Appointments             In 5 days Army Melia Jesse Sans, MD [facility], [facility]

## 2021-09-28 ENCOUNTER — Ambulatory Visit: Payer: Medicare Other | Admitting: Internal Medicine

## 2021-10-16 ENCOUNTER — Ambulatory Visit: Payer: Self-pay | Admitting: *Deleted

## 2021-10-16 NOTE — Telephone Encounter (Signed)
  Chief Complaint: Left foot and ankle pain Symptoms: pain in ankle with walking Frequency: Since this past weekend. Pertinent Negatives: Patient denies accidents.  She may have turned her ankle but she doesn't really remember doing it. Disposition: '[]'$ ED /'[]'$ Urgent Care (no appt availability in office) / '[x]'$ Appointment(In office/virtual)/ '[]'$  [facility]/ '[]'$ Home Care/ '[]'$ Refused Recommended Disposition /'[]'$ [facility]/ '[]'$  Follow-up with PCP Additional Notes: Appt. Made for 10/20/2021 at 3:40 with Dr. Rosette Reveal.

## 2021-10-16 NOTE — Telephone Encounter (Signed)
Reason for Disposition  [1] MODERATE pain (e.g., interferes with normal activities, limping) AND [2] present > 3 days  Answer Assessment - Initial Assessment Questions 1. ONSET: "When did the pain start?"      Left foot and ankle is hurting.    I think Carrie Myers is a good bone dr.   Rene Paci seen him before. 2. LOCATION: "Where is the pain located?"      Left foot. Last weekend I turned my foot, I think.  But it was fine    I was walking fine.   A day or two later the pain started.    I have sciatic nerve pain.   I don't think it's that.    3. PAIN: "How bad is the pain?"    (Scale 1-10; or mild, moderate, severe)  - MILD (1-3): doesn't interfere with normal activities.   - MODERATE (4-7): interferes with normal activities (e.g., work or school) or awakens from sleep, limping.   - SEVERE (8-10): excruciating pain, unable to do any normal activities, unable to walk.      If I bend my ankle walking the top of my foot and inside of the ankle hurts and stings    I'm using the Ace wrap and it helps.   It's starting to swell.    4. WORK OR EXERCISE: "Has there been any recent work or exercise that involved this part of the body?"      Nothing 5. CAUSE: "What do you think is causing the foot pain?"     I don't know 6. OTHER SYMPTOMS: "Do you have any other symptoms?" (e.g., leg pain, rash, fever, numbness)     No 7. PREGNANCY: "Is there any chance you are pregnant?" "When was your last menstrual period?"     N/A  Protocols used: Foot Pain-A-AH

## 2021-10-19 DIAGNOSIS — Z961 Presence of intraocular lens: Secondary | ICD-10-CM | POA: Diagnosis not present

## 2021-10-19 DIAGNOSIS — H18523 Epithelial (juvenile) corneal dystrophy, bilateral: Secondary | ICD-10-CM | POA: Diagnosis not present

## 2021-10-19 DIAGNOSIS — Z9889 Other specified postprocedural states: Secondary | ICD-10-CM | POA: Diagnosis not present

## 2021-10-20 ENCOUNTER — Ambulatory Visit
Admission: RE | Admit: 2021-10-20 | Discharge: 2021-10-20 | Disposition: A | Discharge status: Home / Self Care | Payer: Medicare Other | Source: Ambulatory Visit | Attending: Family Medicine | Admitting: Family Medicine

## 2021-10-20 ENCOUNTER — Encounter: Payer: Self-pay | Admitting: Family Medicine

## 2021-10-20 ENCOUNTER — Ambulatory Visit
Admission: RE | Admit: 2021-10-20 | Discharge: 2021-10-20 | Disposition: A | Discharge status: Home / Self Care | Payer: Medicare Other | Attending: Family Medicine | Admitting: Family Medicine

## 2021-10-20 ENCOUNTER — Ambulatory Visit (INDEPENDENT_AMBULATORY_CARE_PROVIDER_SITE_OTHER): Payer: Medicare Other | Admitting: Family Medicine

## 2021-10-20 VITALS — BP 136/72 | HR 88 | Ht 68.0 in | Wt 187.2 lb

## 2021-10-20 DIAGNOSIS — M25572 Pain in left ankle and joints of left foot: Secondary | ICD-10-CM | POA: Diagnosis not present

## 2021-10-20 NOTE — Patient Instructions (Signed)
-   Obtain x-ray today - Use CAM boot x 1-2 weeks - Start home exercises this weekend - We will contact you with XR results and next steps

## 2021-10-20 NOTE — Progress Notes (Signed)
     Primary Care / Sports Medicine Office Visit  Patient Information:  Patient ID: Carrie Myers, female DOB: [birth year] Age: 76 y.o. MRN: [id number]   Carrie Myers is a pleasant 76 y.o. female presenting with the following:  Chief Complaint  Patient presents with   Ankle Pain    Left ankle pain for 1.5 week, no known injury, no swelling. No medications. Hurts top of foot across to inside.     Vitals:   10/20/21 1541  BP: 136/72  Pulse: 88  SpO2: 98%   Vitals:   10/20/21 1541  Weight: 187 lb 3.2 oz (84.9 kg)  Height: '5\' 8"'$  (1.727 m)   Body mass index is 28.46 kg/m.  No results found.   Independent interpretation of notes and tests performed by another provider:   None  Procedures performed:   None  Pertinent History, Exam, Impression, and Recommendations:   Problem List Items Addressed This Visit       Other   Arthralgia of left ankle - Primary    Acute on chronic left ankle pain, described "twising ankle" 2 weeks prior, was essentially asymptomatic but began to have anterior ankle pain 1.5 weeks prior. Does have a history of prior ankle fracture. Exam show pain with weighted dorsiflexion and plantarflexion, some tenderness anteromedial joint line. Most consistent with OA, plan for CAM boot, home rehab, add ice to treatment given her current medications. We will get x-rays to determine degree of OA and coordinate next steps accordingly. Recalcitrant symptoms to be addressed with cortisone if clinically indicated.      Relevant Orders   DG Ankle Complete Left     Orders & Medications No orders of the defined types were placed in this encounter.  Orders Placed This Encounter  Procedures   DG Ankle Complete Left     No follow-ups on file.     Montel Culver, MD   Primary Care Sports Medicine [place]

## 2021-10-20 NOTE — Assessment & Plan Note (Signed)
Acute on chronic left ankle pain, described "twising ankle" 2 weeks prior, was essentially asymptomatic but began to have anterior ankle pain 1.5 weeks prior. Does have a history of prior ankle fracture. Exam show pain with weighted dorsiflexion and plantarflexion, some tenderness anteromedial joint line. Most consistent with OA, plan for CAM boot, home rehab, add ice to treatment given her current medications. We will get x-rays to determine degree of OA and coordinate next steps accordingly. Recalcitrant symptoms to be addressed with cortisone if clinically indicated.

## 2021-10-21 ENCOUNTER — Other Ambulatory Visit: Payer: Self-pay | Admitting: Internal Medicine

## 2021-10-21 DIAGNOSIS — M797 Fibromyalgia: Secondary | ICD-10-CM

## 2021-10-21 NOTE — Telephone Encounter (Signed)
Requested medication (s) are due for refill today -yes  Requested medication (s) are on the active medication list -yes  Future visit scheduled -no  Last refill: 09/23/21 #60  Notes to clinic: non delegated Rx  Requested Prescriptions  Pending Prescriptions Disp Refills   Diclofenac-miSOPROStol 75-0.2 MG TBEC [Pharmacy Med Name: DICLOFENAC-MISOPROSTOL 75-0.2 MG DR] 60 tablet 0    Sig: TAKE (1) TABLET BY MOUTH TWICE DAILY     Not Delegated - Analgesics:  Antirheumatic Agents - diclofenac/misoprostol Failed - 10/21/2021 10:49 AM      Failed - This refill cannot be delegated      Failed - Cr in normal range and within 360 days    Creatinine, Ser  Date Value Ref Range Status  03/31/2021 1.03 (H) 0.57 - 1.00 mg/dL Final         Passed - HGB in normal range and within 360 days    Hemoglobin  Date Value Ref Range Status  03/31/2021 12.3 11.1 - 15.9 g/dL Final         Passed - Patient is not pregnant      Passed - Valid encounter within last 12 months    Recent Outpatient Visits           Yesterday Arthralgia of left ankle   [facility] Montel Culver, MD   6 months ago Benign essential HTN   [facility] [name], MD   7 months ago Acute recurrent maxillary sinusitis   [facility] [name], MD   9 months ago Primary osteoarthritis of right knee   [facility] Montel Culver, MD   10 months ago Primary osteoarthritis of right knee   [facility] Montel Culver, MD                 Requested Prescriptions  Pending Prescriptions Disp Refills   Diclofenac-miSOPROStol 75-0.2 MG TBEC [Pharmacy Med Name: DICLOFENAC-MISOPROSTOL 75-0.2 MG DR] 60 tablet 0    Sig: TAKE (1) TABLET BY MOUTH TWICE DAILY     Not Delegated - Analgesics:  Antirheumatic Agents - diclofenac/misoprostol Failed - 10/21/2021 10:49 AM      Failed - This refill cannot be delegated      Failed - Cr in normal range and within 360  days    Creatinine, Ser  Date Value Ref Range Status  03/31/2021 1.03 (H) 0.57 - 1.00 mg/dL Final         Passed - HGB in normal range and within 360 days    Hemoglobin  Date Value Ref Range Status  03/31/2021 12.3 11.1 - 15.9 g/dL Final         Passed - Patient is not pregnant      Passed - Valid encounter within last 12 months    Recent Outpatient Visits           Yesterday Arthralgia of left ankle   [facility] Montel Culver, MD   6 months ago Benign essential HTN   [facility] [name], MD   7 months ago Acute recurrent maxillary sinusitis   [facility] [name], MD   9 months ago Primary osteoarthritis of right knee   [facility] Montel Culver, MD   10 months ago Primary osteoarthritis of right knee   [facility] Montel Culver, MD

## 2021-10-25 ENCOUNTER — Other Ambulatory Visit: Payer: Self-pay | Admitting: Internal Medicine

## 2021-10-26 NOTE — Telephone Encounter (Signed)
Requested Prescriptions  Pending Prescriptions Disp Refills  . levothyroxine (SYNTHROID) 88 MCG tablet [Pharmacy Med Name: Levothyroxine Sodium 88 MCG Oral Tablet] 90 tablet 3    Sig: TAKE 1 TABLET BY MOUTH DAILY     Endocrinology:  Hypothyroid Agents Passed - 10/25/2021  5:58 AM      Passed - TSH in normal range and within 360 days    TSH  Date Value Ref Range Status  03/31/2021 3.340 0.450 - 4.500 uIU/mL Final         Passed - Valid encounter within last 12 months    Recent Outpatient Visits          6 days ago Arthralgia of left ankle   [facility] Montel Culver, MD   6 months ago Benign essential HTN   [facility] [name], MD   7 months ago Acute recurrent maxillary sinusitis   [facility] [name], MD   9 months ago Primary osteoarthritis of right knee   [facility] Montel Culver, MD   10 months ago Primary osteoarthritis of right knee   [facility] Montel Culver, MD

## 2021-11-04 ENCOUNTER — Ambulatory Visit
Admission: RE | Admit: 2021-11-04 | Discharge: 2021-11-04 | Disposition: A | Discharge status: Home / Self Care | Payer: Medicare Other | Source: Ambulatory Visit | Attending: Internal Medicine | Admitting: Internal Medicine

## 2021-11-04 DIAGNOSIS — Z1231 Encounter for screening mammogram for malignant neoplasm of breast: Secondary | ICD-10-CM | POA: Diagnosis not present

## 2021-12-18 ENCOUNTER — Other Ambulatory Visit: Payer: Self-pay | Admitting: Internal Medicine

## 2021-12-18 DIAGNOSIS — M797 Fibromyalgia: Secondary | ICD-10-CM

## 2021-12-18 NOTE — Telephone Encounter (Signed)
Requested medication (s) are due for refill today:   Provider to review  Requested medication (s) are on the active medication list:   Yes  Future visit scheduled:   No   Last ordered: 10/21/2021 #60, 0 refills  Non delegated refill reason returned   Requested Prescriptions  Pending Prescriptions Disp Refills   Diclofenac-miSOPROStol 75-0.2 MG TBEC [Pharmacy Med Name: DICLOFENAC-MISOPROSTOL 75-0.2 MG DR] 60 tablet 0    Sig: TAKE (1) TABLET BY MOUTH TWICE DAILY     Not Delegated - Analgesics:  Antirheumatic Agents - diclofenac/misoprostol Failed - 12/18/2021  2:21 PM      Failed - This refill cannot be delegated      Failed - Cr in normal range and within 360 days    Creatinine, Ser  Date Value Ref Range Status  03/31/2021 1.03 (H) 0.57 - 1.00 mg/dL Final         Passed - HGB in normal range and within 360 days    Hemoglobin  Date Value Ref Range Status  03/31/2021 12.3 11.1 - 15.9 g/dL Final         Passed - Patient is not pregnant      Passed - Valid encounter within last 12 months    Recent Outpatient Visits           1 month ago Arthralgia of left ankle   [facility] [place], MD   8 months ago Benign essential HTN   [place] [facility], Jesse Sans, MD   9 months ago Acute recurrent maxillary sinusitis   [facility], Jesse Sans, MD   11 months ago Primary osteoarthritis of right knee   [place] [facility] [place], Earley Abide, MD   1 year ago Primary osteoarthritis of right knee   [place] [facility], Earley Abide, MD

## 2021-12-23 ENCOUNTER — Ambulatory Visit: Payer: Medicare Other | Admitting: Anesthesiology

## 2021-12-29 ENCOUNTER — Ambulatory Visit: Payer: Medicare Other | Attending: Anesthesiology | Admitting: Anesthesiology

## 2021-12-29 ENCOUNTER — Other Ambulatory Visit: Payer: Self-pay | Admitting: Internal Medicine

## 2021-12-29 ENCOUNTER — Encounter: Payer: Self-pay | Admitting: Anesthesiology

## 2021-12-29 DIAGNOSIS — M5441 Lumbago with sciatica, right side: Secondary | ICD-10-CM

## 2021-12-29 DIAGNOSIS — M5442 Lumbago with sciatica, left side: Secondary | ICD-10-CM

## 2021-12-29 DIAGNOSIS — I2510 Atherosclerotic heart disease of native coronary artery without angina pectoris: Secondary | ICD-10-CM | POA: Diagnosis not present

## 2021-12-29 DIAGNOSIS — M47816 Spondylosis without myelopathy or radiculopathy, lumbar region: Secondary | ICD-10-CM

## 2021-12-29 DIAGNOSIS — F331 Major depressive disorder, recurrent, moderate: Secondary | ICD-10-CM

## 2021-12-29 DIAGNOSIS — F1190 Opioid use, unspecified, uncomplicated: Secondary | ICD-10-CM

## 2021-12-29 DIAGNOSIS — M545 Low back pain, unspecified: Secondary | ICD-10-CM | POA: Diagnosis not present

## 2021-12-29 DIAGNOSIS — G894 Chronic pain syndrome: Secondary | ICD-10-CM

## 2021-12-29 DIAGNOSIS — M5136 Other intervertebral disc degeneration, lumbar region: Secondary | ICD-10-CM | POA: Diagnosis not present

## 2021-12-29 DIAGNOSIS — M797 Fibromyalgia: Secondary | ICD-10-CM

## 2021-12-29 DIAGNOSIS — G8929 Other chronic pain: Secondary | ICD-10-CM | POA: Diagnosis not present

## 2021-12-29 DIAGNOSIS — M542 Cervicalgia: Secondary | ICD-10-CM

## 2021-12-29 MED ORDER — HYDROCODONE-ACETAMINOPHEN 5-325 MG PO TABS: 1.0000 | ORAL_TABLET | Freq: Two times a day (BID) | ORAL | 0 refills | Status: DC | PRN

## 2021-12-29 MED ORDER — DIAZEPAM 5 MG PO TABS: 5.0000 mg | ORAL_TABLET | Freq: Every evening | ORAL | 3 refills | Status: AC | PRN

## 2021-12-29 NOTE — Progress Notes (Signed)
Virtual Visit via Telephone Note  I connected with Carrie Myers on 12/29/21 at  3:00 PM EDT by telephone and verified that I am speaking with the correct person using two identifiers.  Location: Patient: Home Provider: [facility] control center   I discussed the limitations, risks, security and privacy concerns of performing an evaluation and management service by telephone and the availability of in person appointments. I also discussed with the patient that there may be a patient responsible charge related to this service. The patient expressed understanding and agreed to proceed.   History of Present Illness: I spoke with Carrie Myers today via telephone as we are unable link for the video portion of the conference.  She still suffering from a lot of low back pain but this back pain is primarily central lumbar region aching gnawing in nature with radiation to the left buttocks.  In questioning she states it is worse with prolonged standing and worse with extension and left lateral rotation.  She gets some with right lateral rotation additionally but this pain is problematic and generally troublesome despite her hydrocodone.  She takes 1 or 2 tablets/day spaced out accordingly to help with her chronic low back pain.  In the past she has had sciatica and this is responded favorably to epidurals generally giving her about 2 to 3 months relief of the sciatica symptoms but the back pain persist.  Despite the hydrocodone where she gets 50 to 75% relief this pain recently has been more troublesome.  She has tried physical therapy exercises stretching and core strengthening and walking without relief.  Ice and heat do not help.  The medications are well received without side effect.  They do help but despite these she continues to have a lot of low back pain.  She takes her hydrocodone generally in the morning and around 4 or 5:00 in the afternoon.  She also uses Valium to help with muscle spasms at night  and sleep.  This works well for her.  Review of systems: General: No fevers or chills Pulmonary: No shortness of breath or dyspnea Cardiac: No angina or palpitations or lightheadedness GI: No abdominal pain or constipation Psych: No depression    Observations/Objective:  Current Outpatient Medications:    diazepam (VALIUM) 5 MG tablet, Take 1 tablet (5 mg total) by mouth at bedtime as needed for anxiety., Disp: 30 tablet, Rfl: 3   Armodafinil 150 MG tablet, Take 150 mg by mouth daily. For fibromyalgia fatigue, Disp: , Rfl:    aspirin EC 81 MG tablet, Take 81 mg by mouth daily., Disp: , Rfl:    citalopram (CELEXA) 20 MG tablet, TAKE 1 TABLET BY MOUTH  DAILY, Disp: 90 tablet, Rfl: 3   cyclobenzaprine (FLEXERIL) 10 MG tablet, Take 10 mg by mouth 2 (two) times daily., Disp: , Rfl:    diclofenac (VOLTAREN) 75 MG EC tablet, TAKE (1) TABLET BY MOUTH TWICE DAILY, Disp: 60 tablet, Rfl: 2   Diclofenac-miSOPROStol 75-0.2 MG TBEC, TAKE (1) TABLET BY MOUTH TWICE DAILY, Disp: 60 tablet, Rfl: 0   DULoxetine (CYMBALTA) 60 MG capsule, TAKE 1 CAPSULE BY MOUTH  DAILY, Disp: 90 capsule, Rfl: 1   fluticasone-salmeterol (ADVAIR HFA) 115-21 MCG/ACT inhaler, Inhale into the lungs 2 (two) times daily., Disp: , Rfl:    HYDROcodone-acetaminophen (NORCO/VICODIN) 5-325 MG tablet, Take 1 tablet by mouth 2 (two) times daily as needed for moderate pain., Disp: 45 tablet, Rfl: 0   [START ON 01/28/2022] HYDROcodone-acetaminophen (NORCO/VICODIN) 5-325 MG tablet,  Take 1 tablet by mouth 2 (two) times daily as needed for moderate pain or severe pain., Disp: 45 tablet, Rfl: 0   hydrocortisone 2.5 % cream, Apply topically 2 (two) times daily as needed (Rash)., Disp: 30 g, Rfl: 2   ketoconazole (NIZORAL) 2 % shampoo, 1-2 times a week lather on scalp/forehead, leave on 5-8 minutes, rinse well, Disp: 120 mL, Rfl: 3   levocetirizine (XYZAL) 5 MG tablet, Take by mouth., Disp: , Rfl:    levothyroxine (SYNTHROID) 88 MCG tablet, TAKE 1  TABLET BY MOUTH DAILY, Disp: 90 tablet, Rfl: 1   Loperamide HCl (IMODIUM PO), Take 1 tablet by mouth as needed., Disp: , Rfl:    losartan (COZAAR) 50 MG tablet, Take 100 mg by mouth daily., Disp: , Rfl:    mirabegron ER (MYRBETRIQ) 50 MG TB24 tablet, Take 1 tablet (50 mg total) by mouth daily., Disp: 30 tablet, Rfl: 11   Multiple Vitamin (MULTI-VITAMINS) TABS, Take by mouth., Disp: , Rfl:    NON FORMULARY, at bedtime. CPAP @ 11 cm H2O with Oxygen, Disp: , Rfl:    pantoprazole (PROTONIX) 40 MG tablet, Take 1 tablet by mouth 2 (two) times daily., Disp: , Rfl:    pimecrolimus (ELIDEL) 1 % cream, Apply BID to affected areas on face/eyelids, Disp: 30 g, Rfl: 2   pravastatin (PRAVACHOL) 80 MG tablet, Take 80 mg by mouth daily., Disp: , Rfl:    pregabalin (LYRICA) 50 MG capsule, Take 50 mg by mouth 2 (two) times daily., Disp: , Rfl:    Past Medical History:  Diagnosis Date   Allergy    Anxiety    Arthritis    neck, hands, lower back   Bilateral dry eyes    Chronic low back pain 12/23/2016   Coronary artery disease    DDD (degenerative disc disease), lumbar 10/02/2014   Dental crowns present    Implants, front "flipper" while waiting for other implants   Emphysema of lung ([place])    Fibromyalgia    Fibromyalgia    Fracture of right foot 01/14/2015   GERD (gastroesophageal reflux disease)    History of Graves' disease    Hyperlipidemia    Hypertension    Hypothyroidism    Myocardial infarction ([place]) 04/22/2006   Osteoarthritis    Other shoulder lesions, right shoulder 04/17/2015   Rheumatoid arthritis ([place])    Sciatica, left side 01/01/2019   Sleep apnea    CPAP   Status post shoulder surgery 05/27/2015   Thyroid disease    Vertigo    none for approx 9 yrs     Assessment and Plan: 1. Chronic bilateral low back pain with bilateral sciatica   2. DDD (degenerative disc disease), lumbar   3. Chronic, continuous use of opioids   4. Chronic bilateral low back pain without sciatica    5. Fibromyalgia   6. Facet syndrome, lumbar   7. Chronic pain syndrome   8. Cervicalgia   9. Spondylosis of lumbar region without myelopathy or radiculopathy   Based on our discussion today and after review of the [facility] practitioner database information I think is appropriate to refill her medicines for the next 2 months.  This will be dated for October 17 and November 16.  I am also scheduling her for diagnostic facet block.  We gone over the risks and benefits of the procedure.  We have also discussed options regarding a possible radiofrequency should this be successful.  Continue core stretching strengthening exercises.  Continue follow-up  with her primary care physician for baseline medical care with return to clinic scheduled in 1 month.  Follow Up Instructions:    I discussed the assessment and treatment plan with the patient. The patient was provided an opportunity to ask questions and all were answered. The patient agreed with the plan and demonstrated an understanding of the instructions.   The patient was advised to call back or seek an in-person evaluation if the symptoms worsen or if the condition fails to improve as anticipated.  I provided 30 minutes of non-face-to-face time during this encounter.   Molli Barrows, MD

## 2021-12-30 ENCOUNTER — Telehealth: Payer: Self-pay | Admitting: Anesthesiology

## 2021-12-30 NOTE — Telephone Encounter (Signed)
Requested Prescriptions  Pending Prescriptions Disp Refills  . DULoxetine (CYMBALTA) 60 MG capsule [Pharmacy Med Name: DULoxetine HCl 60 MG Oral Capsule Delayed Release Particles] 90 capsule 0    Sig: TAKE 1 CAPSULE BY MOUTH DAILY     Psychiatry: Antidepressants - SNRI - duloxetine Failed - 12/29/2021 10:43 PM      Failed - Cr in normal range and within 360 days    Creatinine, Ser  Date Value Ref Range Status  03/31/2021 1.03 (H) 0.57 - 1.00 mg/dL Final         Passed - eGFR is 30 or above and within 360 days    GFR calc Af Amer  Date Value Ref Range Status  03/28/2020 66 >59 mL/min/1.73 Final    Comment:    **In accordance with recommendations from the NKF-ASN Task force,**   [facility] is in the process of updating its eGFR calculation to the   2021 CKD-EPI creatinine equation that estimates kidney function   without a race variable.    GFR calc non Af Amer  Date Value Ref Range Status  03/28/2020 57 (L) >59 mL/min/1.73 Final   eGFR  Date Value Ref Range Status  03/31/2021 57 (L) >59 mL/min/1.73 Final         Passed - Completed PHQ-2 or PHQ-9 in the last 360 days      Passed - Last BP in normal range    BP Readings from Last 1 Encounters:  10/20/21 136/72         Passed - Valid encounter within last 6 months    Recent Outpatient Visits          2 months ago Arthralgia of left ankle   [facility] [place], Earley Abide, MD   9 months ago Benign essential HTN   [facility], Jesse Sans, MD   9 months ago Acute recurrent maxillary sinusitis   [place] [facility], Jesse Sans, MD   11 months ago Primary osteoarthritis of right knee   [place] [facility] [place], Earley Abide, MD   1 year ago Primary osteoarthritis of right knee   [facility] at  [facility], Earley Abide, MD      Future Appointments            In 2 months Army Melia, Jesse Sans, MD [facility] at [facility], [facility]   In 3 months Army Melia, Jesse Sans, MD [place] [facility] at [facility], [facility]

## 2021-12-30 NOTE — Telephone Encounter (Signed)
PT wants to know if she can come in next  week to get procedure done. In doctor check out notes he wanted patient to be scheduled for Nov.

## 2021-12-30 NOTE — Telephone Encounter (Signed)
That should be fine if he has an appointment available

## 2021-12-30 NOTE — Telephone Encounter (Signed)
Called pt to schedule appt. Was to follow up in 6 months since on new antidepressant, pt cancelled in July and has no upcoming appt scheduled. Pt stated she would have to call back when she could get to her calendar and she will make an appt.

## 2022-01-06 ENCOUNTER — Other Ambulatory Visit: Payer: Self-pay | Admitting: Anesthesiology

## 2022-01-06 ENCOUNTER — Ambulatory Visit
Admission: RE | Admit: 2022-01-06 | Discharge: 2022-01-06 | Disposition: A | Discharge status: Home / Self Care | Payer: Medicare Other | Source: Ambulatory Visit | Attending: Anesthesiology | Admitting: Anesthesiology

## 2022-01-06 ENCOUNTER — Ambulatory Visit (HOSPITAL_BASED_OUTPATIENT_CLINIC_OR_DEPARTMENT_OTHER): Payer: Medicare Other | Admitting: Anesthesiology

## 2022-01-06 ENCOUNTER — Encounter: Payer: Self-pay | Admitting: Anesthesiology

## 2022-01-06 VITALS — BP 164/96 | HR 88 | Temp 97.2°F | Resp 16 | Ht 68.0 in | Wt 185.0 lb

## 2022-01-06 DIAGNOSIS — M545 Low back pain, unspecified: Secondary | ICD-10-CM

## 2022-01-06 DIAGNOSIS — G8929 Other chronic pain: Secondary | ICD-10-CM | POA: Diagnosis present

## 2022-01-06 DIAGNOSIS — M47816 Spondylosis without myelopathy or radiculopathy, lumbar region: Secondary | ICD-10-CM | POA: Insufficient documentation

## 2022-01-06 DIAGNOSIS — G894 Chronic pain syndrome: Secondary | ICD-10-CM

## 2022-01-06 DIAGNOSIS — M797 Fibromyalgia: Secondary | ICD-10-CM | POA: Insufficient documentation

## 2022-01-06 DIAGNOSIS — F1190 Opioid use, unspecified, uncomplicated: Secondary | ICD-10-CM | POA: Insufficient documentation

## 2022-01-06 DIAGNOSIS — M542 Cervicalgia: Secondary | ICD-10-CM | POA: Insufficient documentation

## 2022-01-06 DIAGNOSIS — R52 Pain, unspecified: Secondary | ICD-10-CM | POA: Diagnosis present

## 2022-01-06 MED ORDER — ROPIVACAINE HCL 2 MG/ML IJ SOLN
10.0000 mL | Freq: Once | INTRAMUSCULAR | Status: AC
  Administered 2022-01-06: 10 mL via EPIDURAL

## 2022-01-06 MED ORDER — TRIAMCINOLONE ACETONIDE 40 MG/ML IJ SUSP
INTRAMUSCULAR | Status: AC
  Filled 2022-01-06: qty 1

## 2022-01-06 MED ORDER — TRIAMCINOLONE ACETONIDE 40 MG/ML IJ SUSP
40.0000 mg | Freq: Once | INTRAMUSCULAR | Status: AC
  Administered 2022-01-06: 40 mg

## 2022-01-06 MED ORDER — ROPIVACAINE HCL 2 MG/ML IJ SOLN
INTRAMUSCULAR | Status: AC
  Filled 2022-01-06: qty 20

## 2022-01-06 MED ORDER — LIDOCAINE HCL (PF) 1 % IJ SOLN
10.0000 mL | Freq: Once | INTRAMUSCULAR | Status: AC
  Administered 2022-01-06: 10 mL via SUBCUTANEOUS

## 2022-01-06 MED ORDER — SODIUM CHLORIDE 0.9% FLUSH: 10.0000 mL | Freq: Once | INTRAVENOUS | Status: DC

## 2022-01-06 MED ORDER — LIDOCAINE HCL (PF) 1 % IJ SOLN
INTRAMUSCULAR | Status: AC
  Filled 2022-01-06: qty 5

## 2022-01-06 NOTE — Patient Instructions (Signed)

## 2022-01-06 NOTE — Progress Notes (Signed)
Safety precautions to be maintained throughout the outpatient stay will include: orient to surroundings, keep bed in low position, maintain call bell within reach at all times, provide assistance with transfer out of bed and ambulation.  

## 2022-01-08 NOTE — Progress Notes (Signed)
Subjective:  Patient ID: Carrie Myers, female    DOB: September 09, 1945  Age: 76 y.o. MRN: [id number]  CC: Back Pain (Radiating into left hip)   Procedure: Medial branch block to the left facet nerves at L3-4 L4-5 L5-S1 under fluoroscopic guidance with minimal sedation  HPI Carrie Myers presents for reevaluation.  She continues to have severe back pain with radiation into the buttocks described as an aching gnawing pain worse with prolonged standing and certain rotational motions.  The pain sometimes radiates into the buttocks and lateral hips worse on the left side than right.  No lower extremity weakness is reported she still has a fair amount of lumbar spasming and continues to take her current medication management using opioids sparingly primarily during the early morning and afternoon and Valium at bedtime to help with sleep and muscle spasms.  Otherwise she reports being in her usual state of health.  Outpatient Medications Prior to Visit  Medication Sig Dispense Refill   Armodafinil 150 MG tablet Take 150 mg by mouth daily. For fibromyalgia fatigue     aspirin EC 81 MG tablet Take 81 mg by mouth daily.     citalopram (CELEXA) 20 MG tablet TAKE 1 TABLET BY MOUTH  DAILY 90 tablet 3   cyclobenzaprine (FLEXERIL) 10 MG tablet Take 10 mg by mouth 2 (two) times daily.     diazepam (VALIUM) 5 MG tablet Take 1 tablet (5 mg total) by mouth at bedtime as needed for anxiety. 30 tablet 3   diclofenac (VOLTAREN) 75 MG EC tablet TAKE (1) TABLET BY MOUTH TWICE DAILY 60 tablet 2   DULoxetine (CYMBALTA) 60 MG capsule TAKE 1 CAPSULE BY MOUTH DAILY 90 capsule 0   fluticasone-salmeterol (ADVAIR HFA) 115-21 MCG/ACT inhaler Inhale into the lungs 2 (two) times daily.     HYDROcodone-acetaminophen (NORCO/VICODIN) 5-325 MG tablet Take 1 tablet by mouth 2 (two) times daily as needed for moderate pain. 45 tablet 0   [START ON 01/28/2022] HYDROcodone-acetaminophen (NORCO/VICODIN) 5-325 MG tablet Take 1  tablet by mouth 2 (two) times daily as needed for moderate pain or severe pain. 45 tablet 0   hydrocortisone 2.5 % cream Apply topically 2 (two) times daily as needed (Rash). 30 g 2   ketoconazole (NIZORAL) 2 % shampoo 1-2 times a week lather on scalp/forehead, leave on 5-8 minutes, rinse well 120 mL 3   levocetirizine (XYZAL) 5 MG tablet Take by mouth.     levothyroxine (SYNTHROID) 88 MCG tablet TAKE 1 TABLET BY MOUTH DAILY 90 tablet 1   Loperamide HCl (IMODIUM PO) Take 1 tablet by mouth as needed.     losartan (COZAAR) 50 MG tablet Take 100 mg by mouth daily.     mirabegron ER (MYRBETRIQ) 50 MG TB24 tablet Take 1 tablet (50 mg total) by mouth daily. 30 tablet 11   Multiple Vitamin (MULTI-VITAMINS) TABS Take by mouth.     NON FORMULARY at bedtime. CPAP @ 11 cm H2O with Oxygen     pantoprazole (PROTONIX) 40 MG tablet Take 1 tablet by mouth 2 (two) times daily.     pimecrolimus (ELIDEL) 1 % cream Apply BID to affected areas on face/eyelids 30 g 2   pravastatin (PRAVACHOL) 80 MG tablet Take 80 mg by mouth daily.     pregabalin (LYRICA) 50 MG capsule Take 50 mg by mouth 2 (two) times daily.     Diclofenac-miSOPROStol 75-0.2 MG TBEC TAKE (1) TABLET BY MOUTH TWICE DAILY (Patient not taking: Reported on 01/06/2022)  60 tablet 0   No facility-administered medications prior to visit.    Review of Systems CNS: No confusion or sedation Cardiac: No angina or palpitations GI: No abdominal pain or constipation Constitutional: No nausea vomiting fevers or chills  Objective:  BP (!) 164/96   Pulse 88   Temp (!) 97.2 F (36.2 C)   Resp 16   Ht '5\' 8"'$  (1.727 m)   Wt 185 lb (83.9 kg)   SpO2 100%   BMI 28.13 kg/m    BP Readings from Last 3 Encounters:  01/06/22 (!) 164/96  10/20/21 136/72  06/23/21 138/90     Wt Readings from Last 3 Encounters:  01/06/22 185 lb (83.9 kg)  10/20/21 187 lb 3.2 oz (84.9 kg)  06/23/21 188 lb (85.3 kg)     Physical Exam Pt is alert and oriented PERRL  EOMI HEART IS RRR no murmur or rub LCTA no wheezing or rales MUSCULOSKELETAL reveals some paraspinous muscle tenderness in the lumbar region.  With the patient in the standing position with extension at the low back she has reproduction of her main pain complaint.  It is worse with left lateral rotation and greater pain is invoked and then up on right lateral rotation.  Lower extremity strength is at baseline as is muscle tone and bulk.  Labs  Lab Results  Component Value Date   HGBA1C 6.1 (H) 09/05/2020   HGBA1C 6.5 (H) 03/28/2020   Lab Results  Component Value Date   LDLCALC 60 03/31/2021   CREATININE 1.03 (H) 03/31/2021    -------------------------------------------------------------------------------------------------------------------- Lab Results  Component Value Date   WBC 5.0 03/31/2021   HGB 12.3 03/31/2021   HCT 37.0 03/31/2021   PLT 261 03/31/2021   GLUCOSE 86 03/31/2021   CHOL 136 03/31/2021   TRIG 85 03/31/2021   HDL 60 03/31/2021   LDLCALC 60 03/31/2021   ALT 26 03/31/2021   AST 21 03/31/2021   NA 142 03/31/2021   K 5.4 (H) 03/31/2021   CL 105 03/31/2021   CREATININE 1.03 (H) 03/31/2021   BUN 24 03/31/2021   CO2 27 03/31/2021   TSH 3.340 03/31/2021   HGBA1C 6.1 (H) 09/05/2020    --------------------------------------------------------------------------------------------------------------------- DG PAIN CLINIC C-ARM 1-60 MIN NO REPORT  Result Date: 01/06/2022 Fluoro was used, but no Radiologist interpretation will be provided. Please refer to "NOTES" tab for provider progress note.    Assessment & Plan:   Brynnley was seen today for back pain.  Diagnoses and all orders for this visit:  Chronic, continuous use of opioids  Chronic bilateral low back pain without sciatica  Fibromyalgia  Facet syndrome, lumbar -     triamcinolone acetonide (KENALOG-40) injection 40 mg -     sodium chloride flush (NS) 0.9 % injection 10 mL -     ropivacaine (PF) 2  mg/mL (0.2%) (NAROPIN) injection 10 mL -     lidocaine (PF) (XYLOCAINE) 1 % injection 10 mL  Chronic pain syndrome  Cervicalgia  Spondylosis of lumbar region without myelopathy or radiculopathy -     triamcinolone acetonide (KENALOG-40) injection 40 mg -     sodium chloride flush (NS) 0.9 % injection 10 mL -     ropivacaine (PF) 2 mg/mL (0.2%) (NAROPIN) injection 10 mL -     lidocaine (PF) (XYLOCAINE) 1 % injection 10 mL        ----------------------------------------------------------------------------------------------------------------------  Problem List Items Addressed This Visit       Unprioritized   Chronic pain syndrome (Chronic)  Fibromyalgia (Chronic)   Facet syndrome, lumbar   Relevant Medications   sodium chloride flush (NS) 0.9 % injection 10 mL   Other Visit Diagnoses     Chronic, continuous use of opioids    -  Primary   Chronic bilateral low back pain without sciatica       Relevant Medications   triamcinolone acetonide (KENALOG-40) injection 40 mg (Completed)   ropivacaine (PF) 2 mg/mL (0.2%) (NAROPIN) injection 10 mL (Completed)   lidocaine (PF) (XYLOCAINE) 1 % injection 10 mL (Completed)   Cervicalgia       Spondylosis of lumbar region without myelopathy or radiculopathy       Relevant Medications   triamcinolone acetonide (KENALOG-40) injection 40 mg (Completed)   sodium chloride flush (NS) 0.9 % injection 10 mL   ropivacaine (PF) 2 mg/mL (0.2%) (NAROPIN) injection 10 mL (Completed)   lidocaine (PF) (XYLOCAINE) 1 % injection 10 mL (Completed)         ----------------------------------------------------------------------------------------------------------------------  1. Chronic, continuous use of opioids Continue with the current medication management with no changes initiated today.  She appears to be doing well with her medications with no side effects and improved overall function.  She has failed more conservative therapy.  2. Chronic  bilateral low back pain without sciatica Continue walking and core strengthening  3. Fibromyalgia As above  4. Facet syndrome, lumbar We will proceed with a diagnostic/therapeutic lumbar facet block today.  We gone over the risks and benefits of this with her in detail and all questions answered.  Ultimately she would be a candidate for possible repeat injection about a month depending on her response to therapy with consideration for radiofrequency ablation if indicated. - triamcinolone acetonide (KENALOG-40) injection 40 mg - sodium chloride flush (NS) 0.9 % injection 10 mL - ropivacaine (PF) 2 mg/mL (0.2%) (NAROPIN) injection 10 mL - lidocaine (PF) (XYLOCAINE) 1 % injection 10 mL  5. Chronic pain syndrome As above  6. Cervicalgia   7. Spondylosis of lumbar region without myelopathy or radiculopathy As above - triamcinolone acetonide (KENALOG-40) injection 40 mg - sodium chloride flush (NS) 0.9 % injection 10 mL - ropivacaine (PF) 2 mg/mL (0.2%) (NAROPIN) injection 10 mL - lidocaine (PF) (XYLOCAINE) 1 % injection 10 mL    ----------------------------------------------------------------------------------------------------------------------  I am having Carrie Myers maintain her losartan, pravastatin, aspirin EC, Multi-Vitamins, Loperamide HCl (IMODIUM PO), citalopram, pantoprazole, Armodafinil, cyclobenzaprine, pregabalin, pimecrolimus, ketoconazole, hydrocortisone, levocetirizine, NON FORMULARY, Advair HFA, mirabegron ER, diclofenac, levothyroxine, Diclofenac-miSOPROStol, HYDROcodone-acetaminophen, HYDROcodone-acetaminophen, diazepam, and DULoxetine. We administered triamcinolone acetonide, ropivacaine (PF) 2 mg/mL (0.2%), and lidocaine (PF).   Meds ordered this encounter  Medications   triamcinolone acetonide (KENALOG-40) injection 40 mg   sodium chloride flush (NS) 0.9 % injection 10 mL   ropivacaine (PF) 2 mg/mL (0.2%) (NAROPIN) injection 10 mL   lidocaine (PF)  (XYLOCAINE) 1 % injection 10 mL   Patient's Medications  New Prescriptions   No medications on file  Previous Medications   ARMODAFINIL 150 MG TABLET    Take 150 mg by mouth daily. For fibromyalgia fatigue   ASPIRIN EC 81 MG TABLET    Take 81 mg by mouth daily.   CITALOPRAM (CELEXA) 20 MG TABLET    TAKE 1 TABLET BY MOUTH  DAILY   CYCLOBENZAPRINE (FLEXERIL) 10 MG TABLET    Take 10 mg by mouth 2 (two) times daily.   DIAZEPAM (VALIUM) 5 MG TABLET    Take 1 tablet (5 mg total) by mouth at bedtime as needed  for anxiety.   DICLOFENAC (VOLTAREN) 75 MG EC TABLET    TAKE (1) TABLET BY MOUTH TWICE DAILY   DICLOFENAC-MISOPROSTOL 75-0.2 MG TBEC    TAKE (1) TABLET BY MOUTH TWICE DAILY   DULOXETINE (CYMBALTA) 60 MG CAPSULE    TAKE 1 CAPSULE BY MOUTH DAILY   FLUTICASONE-SALMETEROL (ADVAIR HFA) 115-21 MCG/ACT INHALER    Inhale into the lungs 2 (two) times daily.   HYDROCODONE-ACETAMINOPHEN (NORCO/VICODIN) 5-325 MG TABLET    Take 1 tablet by mouth 2 (two) times daily as needed for moderate pain.   HYDROCODONE-ACETAMINOPHEN (NORCO/VICODIN) 5-325 MG TABLET    Take 1 tablet by mouth 2 (two) times daily as needed for moderate pain or severe pain.   HYDROCORTISONE 2.5 % CREAM    Apply topically 2 (two) times daily as needed (Rash).   KETOCONAZOLE (NIZORAL) 2 % SHAMPOO    1-2 times a week lather on scalp/forehead, leave on 5-8 minutes, rinse well   LEVOCETIRIZINE (XYZAL) 5 MG TABLET    Take by mouth.   LEVOTHYROXINE (SYNTHROID) 88 MCG TABLET    TAKE 1 TABLET BY MOUTH DAILY   LOPERAMIDE HCL (IMODIUM PO)    Take 1 tablet by mouth as needed.   LOSARTAN (COZAAR) 50 MG TABLET    Take 100 mg by mouth daily.   MIRABEGRON ER (MYRBETRIQ) 50 MG TB24 TABLET    Take 1 tablet (50 mg total) by mouth daily.   MULTIPLE VITAMIN (MULTI-VITAMINS) TABS    Take by mouth.   NON FORMULARY    at bedtime. CPAP @ 11 cm H2O with Oxygen   PANTOPRAZOLE (PROTONIX) 40 MG TABLET    Take 1 tablet by mouth 2 (two) times daily.   PIMECROLIMUS  (ELIDEL) 1 % CREAM    Apply BID to affected areas on face/eyelids   PRAVASTATIN (PRAVACHOL) 80 MG TABLET    Take 80 mg by mouth daily.   PREGABALIN (LYRICA) 50 MG CAPSULE    Take 50 mg by mouth 2 (two) times daily.  Modified Medications   No medications on file  Discontinued Medications   No medications on file   ----------------------------------------------------------------------------------------------------------------------  Follow-up: Return in about 1 month (around 02/06/2022) for med refill, evaluation.  Procedure: Left side lumbar medial branch block under fluoroscopic guidance at L3-4 L4-5 L5-S1 with sedation Patient was taken to the fluoroscopy suite and placed in the prone position.Vital signs were stable throughout the procedure. The  overlying area of skin on the left side was prepped with Betadine 3 and strict aseptic technique was utilized throughout the procedure. Flouroscopy was used to  identify the areas overlying the aforementioned facets at  L3-4  L4-5 and  L5-S1. 1% lidocaine 1 cc was infiltrated subcutaneously and into the fascia with a 25-gauge needle at each of these sites. I then advanced a 22-gauge 3-1/2 inch [place]ckie needle with the needle tip to lie at the "[facility]" portion of the [employer] dog". There was negative aspiration for heme or CSF and  no paresthesia. I then injected 2 cc of ropivacaine 0.2% mixed with 10 mg of triamcinolone at each of the aforementioned sites. These needles were withdrawn.  The patient tolerated the procedure without any difficulty and was convalesced discharged home in stable condition for follow-up as mentioned   Molli Barrows, MD

## 2022-01-20 ENCOUNTER — Encounter: Payer: Self-pay | Admitting: Internal Medicine

## 2022-01-22 ENCOUNTER — Other Ambulatory Visit: Payer: Self-pay | Admitting: Internal Medicine

## 2022-01-22 DIAGNOSIS — M797 Fibromyalgia: Secondary | ICD-10-CM

## 2022-01-22 NOTE — Telephone Encounter (Signed)
Requested medication (s) are due for refill today - yes  Requested medication (s) are on the active medication list -yes  Future visit scheduled -yes  Last refill: 12/18/21 #60  Notes to clinic: non delegated Rx  Requested Prescriptions  Pending Prescriptions Disp Refills   Diclofenac-miSOPROStol 75-0.2 MG TBEC [Pharmacy Med Name: DICLOFENAC-MISOPROSTOL 75-0.2 MG DR] 60 tablet 0    Sig: TAKE (1) TABLET BY MOUTH TWICE DAILY     Not Delegated - Analgesics:  Antirheumatic Agents - diclofenac/misoprostol Failed - 01/22/2022 10:33 AM      Failed - This refill cannot be delegated      Failed - Cr in normal range and within 360 days    Creatinine, Ser  Date Value Ref Range Status  03/31/2021 1.03 (H) 0.57 - 1.00 mg/dL Final         Passed - HGB in normal range and within 360 days    Hemoglobin  Date Value Ref Range Status  03/31/2021 12.3 11.1 - 15.9 g/dL Final         Passed - Patient is not pregnant      Passed - Valid encounter within last 12 months    Recent Outpatient Visits           3 months ago Arthralgia of left ankle   [place] [facility] at [place], Earley Abide, MD   9 months ago Benign essential HTN   [place] [facility], Jesse Sans, MD   10 months ago Acute recurrent maxillary sinusitis   [place] [facility] at [facility], Jesse Sans, MD   1 year ago Primary osteoarthritis of right knee   [place] [facility] at [place], Earley Abide, MD   1 year ago Primary osteoarthritis of right knee   [place] [facility] at [facility], Earley Abide, MD       Future Appointments             In 2 months [name], MD [facility] and [facility] Medicine at [facility], [facility]               Requested Prescriptions  Pending Prescriptions [place] 75-0.2 [place] [Pharmacy Med Name: DICLOFENAC-MISOPROSTOL 75-0.2 MG DR] 60 tablet 0    Sig: TAKE (1) TABLET BY MOUTH TWICE DAILY     Not Delegated - Analgesics:  Antirheumatic Agents - diclofenac/misoprostol Failed - 01/22/2022 10:33 AM      Failed - This refill cannot be delegated      Failed - Cr in normal range and within 360 days    Creatinine, Ser  Date Value Ref Range Status  03/31/2021 1.03 (H) 0.57 - 1.00 mg/dL Final         Passed - HGB in normal range and within 360 days    Hemoglobin  Date Value Ref Range Status  03/31/2021 12.3 11.1 - 15.9 g/dL Final         Passed - Patient is not pregnant      Passed - Valid encounter within last 12 months    Recent Outpatient Visits           3 months ago Arthralgia of left ankle    [facility] at [place], [place]   9 months ago  Benign essential HTN   [place] [facility] at [facility], Jesse Sans, MD   10 months ago Acute recurrent maxillary sinusitis   [place] [facility] at [facility], Jesse Sans, MD   1 year ago Primary osteoarthritis of right knee   [facility] and [facility] at [place], Earley Abide, MD   1 year ago Primary osteoarthritis of right knee   [facility] at [facility], Earley Abide, MD       Future Appointments             In 2 months Army Melia, Jesse Sans, MD [place] [facility] at [facility], [facility]

## 2022-01-26 ENCOUNTER — Other Ambulatory Visit: Payer: Self-pay | Admitting: Internal Medicine

## 2022-01-26 ENCOUNTER — Ambulatory Visit: Payer: Medicare Other | Attending: Anesthesiology | Admitting: Anesthesiology

## 2022-01-26 ENCOUNTER — Encounter: Payer: Self-pay | Admitting: Anesthesiology

## 2022-01-26 VITALS — BP 148/73 | HR 92 | Resp 16 | Ht 68.0 in | Wt 182.0 lb

## 2022-01-26 DIAGNOSIS — I2510 Atherosclerotic heart disease of native coronary artery without angina pectoris: Secondary | ICD-10-CM

## 2022-01-26 DIAGNOSIS — M797 Fibromyalgia: Secondary | ICD-10-CM

## 2022-01-26 DIAGNOSIS — M545 Low back pain, unspecified: Secondary | ICD-10-CM | POA: Insufficient documentation

## 2022-01-26 DIAGNOSIS — G8929 Other chronic pain: Secondary | ICD-10-CM

## 2022-01-26 DIAGNOSIS — F1190 Opioid use, unspecified, uncomplicated: Secondary | ICD-10-CM | POA: Diagnosis not present

## 2022-01-26 DIAGNOSIS — M5442 Lumbago with sciatica, left side: Secondary | ICD-10-CM | POA: Diagnosis present

## 2022-01-26 DIAGNOSIS — M542 Cervicalgia: Secondary | ICD-10-CM | POA: Diagnosis present

## 2022-01-26 DIAGNOSIS — M47816 Spondylosis without myelopathy or radiculopathy, lumbar region: Secondary | ICD-10-CM | POA: Diagnosis not present

## 2022-01-26 DIAGNOSIS — M5441 Lumbago with sciatica, right side: Secondary | ICD-10-CM | POA: Insufficient documentation

## 2022-01-26 DIAGNOSIS — G894 Chronic pain syndrome: Secondary | ICD-10-CM

## 2022-01-26 MED ORDER — HYDROCODONE-ACETAMINOPHEN 5-325 MG PO TABS: 1.0000 | ORAL_TABLET | Freq: Two times a day (BID) | ORAL | 0 refills | Status: DC | PRN

## 2022-01-26 NOTE — Progress Notes (Signed)
Subjective:  Patient ID: Carrie Myers, female    DOB: 1945/10/26  Age: 76 y.o. MRN: [id number]  CC: Back Pain (Low left)   Procedure: None  HPI Carrie Myers presents for reevaluation.  She was last seen several weeks ago and had a facet injection.  She reports that her low back pain is significantly improved.  She rates this as a 75% improvement.  She is not experiencing any sciatica symptoms.  She continues take her pain medications as prescribed and these continue to work well for her.  She takes her Valium at bedtime to help with sleep and muscle spasm and this is working well for her.  She takes her Vicodin in the morning and this helps with her low back pain and enables her to stay active and functional.  No side effects with the medications are reported.  The quality characteristic and distribution of the pain is otherwise unchanged but the low back pain is diminished from previous baseline prior to the injection.  Outpatient Medications Prior to Visit  Medication Sig Dispense Refill   Armodafinil 150 MG tablet Take 150 mg by mouth daily. For fibromyalgia fatigue     aspirin EC 81 MG tablet Take 81 mg by mouth daily.     citalopram (CELEXA) 20 MG tablet TAKE 1 TABLET BY MOUTH  DAILY 90 tablet 3   cyclobenzaprine (FLEXERIL) 10 MG tablet Take 10 mg by mouth 2 (two) times daily.     diazepam (VALIUM) 5 MG tablet Take 1 tablet (5 mg total) by mouth at bedtime as needed for anxiety. 30 tablet 3   diclofenac (VOLTAREN) 75 MG EC tablet TAKE (1) TABLET BY MOUTH TWICE DAILY 60 tablet 2   DULoxetine (CYMBALTA) 60 MG capsule TAKE 1 CAPSULE BY MOUTH DAILY 90 capsule 0   fluticasone-salmeterol (ADVAIR HFA) 115-21 MCG/ACT inhaler Inhale into the lungs 2 (two) times daily.     [START ON 01/28/2022] HYDROcodone-acetaminophen (NORCO/VICODIN) 5-325 MG tablet Take 1 tablet by mouth 2 (two) times daily as needed for moderate pain or severe pain. 45 tablet 0   hydrocortisone 2.5 % cream Apply  topically 2 (two) times daily as needed (Rash). 30 g 2   ketoconazole (NIZORAL) 2 % shampoo 1-2 times a week lather on scalp/forehead, leave on 5-8 minutes, rinse well 120 mL 3   levocetirizine (XYZAL) 5 MG tablet Take by mouth.     levothyroxine (SYNTHROID) 88 MCG tablet TAKE 1 TABLET BY MOUTH DAILY 90 tablet 1   Loperamide HCl (IMODIUM PO) Take 1 tablet by mouth as needed.     losartan (COZAAR) 50 MG tablet Take 100 mg by mouth daily.     mirabegron ER (MYRBETRIQ) 50 MG TB24 tablet Take 1 tablet (50 mg total) by mouth daily. 30 tablet 11   Multiple Vitamin (MULTI-VITAMINS) TABS Take by mouth.     NON FORMULARY at bedtime. CPAP @ 11 cm H2O with Oxygen     pantoprazole (PROTONIX) 40 MG tablet Take 1 tablet by mouth 2 (two) times daily.     pimecrolimus (ELIDEL) 1 % cream Apply BID to affected areas on face/eyelids 30 g 2   pravastatin (PRAVACHOL) 80 MG tablet Take 80 mg by mouth daily.     pregabalin (LYRICA) 50 MG capsule Take 50 mg by mouth 2 (two) times daily.     HYDROcodone-acetaminophen (NORCO/VICODIN) 5-325 MG tablet Take 1 tablet by mouth 2 (two) times daily as needed for moderate pain. 45 tablet 0  Diclofenac-miSOPROStol 75-0.2 MG TBEC TAKE (1) TABLET BY MOUTH TWICE DAILY (Patient not taking: Reported on 01/06/2022) 60 tablet 0   No facility-administered medications prior to visit.    Review of Systems CNS: No confusion or sedation Cardiac: No angina or palpitations GI: No abdominal pain or constipation Constitutional: No nausea vomiting fevers or chills  Objective:  BP (!) 148/73   Pulse 92   Resp 16   Ht '5\' 8"'$  (1.727 m)   Wt 182 lb (82.6 kg)   SpO2 99%   BMI 27.67 kg/m    BP Readings from Last 3 Encounters:  01/26/22 (!) 148/73  01/06/22 (!) 164/96  10/20/21 136/72     Wt Readings from Last 3 Encounters:  01/26/22 182 lb (82.6 kg)  01/06/22 185 lb (83.9 kg)  10/20/21 187 lb 3.2 oz (84.9 kg)     Physical Exam Pt is alert and oriented PERRL EOMI HEART IS  RRR no murmur or rub LCTA no wheezing or rales MUSCULOSKELETAL reveals some paraspinous muscle tenderness in the lumbar region.  She walks with a mildly antalgic gait but muscle tone and bulk appears at baseline as does strength.  Labs  Lab Results  Component Value Date   HGBA1C 6.1 (H) 09/05/2020   HGBA1C 6.5 (H) 03/28/2020   Lab Results  Component Value Date   LDLCALC 60 03/31/2021   CREATININE 1.03 (H) 03/31/2021    -------------------------------------------------------------------------------------------------------------------- Lab Results  Component Value Date   WBC 5.0 03/31/2021   HGB 12.3 03/31/2021   HCT 37.0 03/31/2021   PLT 261 03/31/2021   GLUCOSE 86 03/31/2021   CHOL 136 03/31/2021   TRIG 85 03/31/2021   HDL 60 03/31/2021   LDLCALC 60 03/31/2021   ALT 26 03/31/2021   AST 21 03/31/2021   NA 142 03/31/2021   K 5.4 (H) 03/31/2021   CL 105 03/31/2021   CREATININE 1.03 (H) 03/31/2021   BUN 24 03/31/2021   CO2 27 03/31/2021   TSH 3.340 03/31/2021   HGBA1C 6.1 (H) 09/05/2020    --------------------------------------------------------------------------------------------------------------------- DG PAIN CLINIC C-ARM 1-60 MIN NO REPORT  Result Date: 01/06/2022 Fluoro was used, but no Radiologist interpretation will be provided. Please refer to "NOTES" tab for provider progress note.    Assessment & Plan:   Carrie Myers was seen today for back pain.  Diagnoses and all orders for this visit:  Chronic, continuous use of opioids -     ToxASSURE Select 13 (MW), Urine  Chronic bilateral low back pain without sciatica  Fibromyalgia  Facet syndrome, lumbar  Chronic pain syndrome -     ToxASSURE Select 13 (MW), Urine  Spondylosis of lumbar region without myelopathy or radiculopathy  Chronic bilateral low back pain with bilateral sciatica  Cervicalgia  Other orders -     HYDROcodone-acetaminophen (NORCO/VICODIN) 5-325 MG tablet; Take 1 tablet by mouth 2  (two) times daily as needed for moderate pain.        ----------------------------------------------------------------------------------------------------------------------  Problem List Items Addressed This Visit       Unprioritized   Chronic pain syndrome (Chronic)   Relevant Medications   HYDROcodone-acetaminophen (NORCO/VICODIN) 5-325 MG tablet (Start on 02/27/2022)   Other Relevant Orders   ToxASSURE Select 13 (MW), Urine   Fibromyalgia (Chronic)   Relevant Medications   HYDROcodone-acetaminophen (NORCO/VICODIN) 5-325 MG tablet (Start on 02/27/2022)   Facet syndrome, lumbar   Relevant Medications   HYDROcodone-acetaminophen (NORCO/VICODIN) 5-325 MG tablet (Start on 02/27/2022)   Other Visit Diagnoses     Chronic, continuous use  of opioids    -  Primary   Relevant Orders   ToxASSURE Select 13 (MW), Urine   Chronic bilateral low back pain without sciatica       Relevant Medications   HYDROcodone-acetaminophen (NORCO/VICODIN) 5-325 MG tablet (Start on 02/27/2022)   Spondylosis of lumbar region without myelopathy or radiculopathy       Relevant Medications   HYDROcodone-acetaminophen (NORCO/VICODIN) 5-325 MG tablet (Start on 02/27/2022)   Chronic bilateral low back pain with bilateral sciatica       Relevant Medications   HYDROcodone-acetaminophen (NORCO/VICODIN) 5-325 MG tablet (Start on 02/27/2022)   Cervicalgia             ----------------------------------------------------------------------------------------------------------------------  1. Chronic, continuous use of opioids I have reviewed the [facility] practitioner database information I think is appropriate for refills.  These be dated for the next 2 months with an outstanding prescription for November available and a December prescription today. - ToxASSURE Select 13 (MW), Urine  2. Chronic bilateral low back pain without sciatica Continue stretching strengthening exercises with return to clinic  scheduled in 2 months.  3. Fibromyalgia As above  4. Facet syndrome, lumbar   5. Chronic pain syndrome As above - ToxASSURE Select 13 (MW), Urine  6. Spondylosis of lumbar region without myelopathy or radiculopathy   7. Chronic bilateral low back pain with bilateral sciatica   8. Cervicalgia     ----------------------------------------------------------------------------------------------------------------------  I am having Carrie Myers maintain her losartan, pravastatin, aspirin EC, Multi-Vitamins, Loperamide HCl (IMODIUM PO), citalopram, pantoprazole, Armodafinil, cyclobenzaprine, pregabalin, pimecrolimus, ketoconazole, hydrocortisone, levocetirizine, NON FORMULARY, Advair HFA, mirabegron ER, diclofenac, levothyroxine, Diclofenac-miSOPROStol, HYDROcodone-acetaminophen, diazepam, DULoxetine, and HYDROcodone-acetaminophen.   Meds ordered this encounter  Medications   HYDROcodone-acetaminophen (NORCO/VICODIN) 5-325 MG tablet    Sig: Take 1 tablet by mouth 2 (two) times daily as needed for moderate pain.    Dispense:  45 tablet    Refill:  0    To be filled once per month..30 day supply   Patient's Medications  New Prescriptions   No medications on file  Previous Medications   ARMODAFINIL 150 MG TABLET    Take 150 mg by mouth daily. For fibromyalgia fatigue   ASPIRIN EC 81 MG TABLET    Take 81 mg by mouth daily.   CITALOPRAM (CELEXA) 20 MG TABLET    TAKE 1 TABLET BY MOUTH  DAILY   CYCLOBENZAPRINE (FLEXERIL) 10 MG TABLET    Take 10 mg by mouth 2 (two) times daily.   DIAZEPAM (VALIUM) 5 MG TABLET    Take 1 tablet (5 mg total) by mouth at bedtime as needed for anxiety.   DICLOFENAC (VOLTAREN) 75 MG EC TABLET    TAKE (1) TABLET BY MOUTH TWICE DAILY   DICLOFENAC-MISOPROSTOL 75-0.2 MG TBEC    TAKE (1) TABLET BY MOUTH TWICE DAILY   DULOXETINE (CYMBALTA) 60 MG CAPSULE    TAKE 1 CAPSULE BY MOUTH DAILY   FLUTICASONE-SALMETEROL (ADVAIR HFA) 115-21 MCG/ACT INHALER    Inhale  into the lungs 2 (two) times daily.   HYDROCODONE-ACETAMINOPHEN (NORCO/VICODIN) 5-325 MG TABLET    Take 1 tablet by mouth 2 (two) times daily as needed for moderate pain or severe pain.   HYDROCORTISONE 2.5 % CREAM    Apply topically 2 (two) times daily as needed (Rash).   KETOCONAZOLE (NIZORAL) 2 % SHAMPOO    1-2 times a week lather on scalp/forehead, leave on 5-8 minutes, rinse well   LEVOCETIRIZINE (XYZAL) 5 MG TABLET    Take  by mouth.   LEVOTHYROXINE (SYNTHROID) 88 MCG TABLET    TAKE 1 TABLET BY MOUTH DAILY   LOPERAMIDE HCL (IMODIUM PO)    Take 1 tablet by mouth as needed.   LOSARTAN (COZAAR) 50 MG TABLET    Take 100 mg by mouth daily.   MIRABEGRON ER (MYRBETRIQ) 50 MG TB24 TABLET    Take 1 tablet (50 mg total) by mouth daily.   MULTIPLE VITAMIN (MULTI-VITAMINS) TABS    Take by mouth.   NON FORMULARY    at bedtime. CPAP @ 11 cm H2O with Oxygen   PANTOPRAZOLE (PROTONIX) 40 MG TABLET    Take 1 tablet by mouth 2 (two) times daily.   PIMECROLIMUS (ELIDEL) 1 % CREAM    Apply BID to affected areas on face/eyelids   PRAVASTATIN (PRAVACHOL) 80 MG TABLET    Take 80 mg by mouth daily.   PREGABALIN (LYRICA) 50 MG CAPSULE    Take 50 mg by mouth 2 (two) times daily.  Modified Medications   Modified Medication Previous Medication   HYDROCODONE-ACETAMINOPHEN (NORCO/VICODIN) 5-325 MG TABLET HYDROcodone-acetaminophen (NORCO/VICODIN) 5-325 MG tablet      Take 1 tablet by mouth 2 (two) times daily as needed for moderate pain.    Take 1 tablet by mouth 2 (two) times daily as needed for moderate pain.  Discontinued Medications   No medications on file   ----------------------------------------------------------------------------------------------------------------------  Follow-up: Return in about 2 months (around 03/28/2022) for evaluation, med refill.    Molli Barrows, MD

## 2022-01-26 NOTE — Progress Notes (Signed)
Nursing Pain Medication Assessment:  Safety precautions to be maintained throughout the outpatient stay will include: orient to surroundings, keep bed in low position, maintain call bell within reach at all times, provide assistance with transfer out of bed and ambulation.  Medication Inspection Compliance: Pill count conducted under aseptic conditions, in front of the patient. Neither the pills nor the bottle was removed from the patient's sight at any time. Once count was completed pills were immediately returned to the patient in their original bottle.  Medication:  valium Pill/Patch Count:  3 of 30 pills remain Pill/Patch Appearance: Markings consistent with prescribed medication Bottle Appearance: Standard pharmacy container. Clearly labeled. Filled Date: 67 / 17 / 2023 Last Medication intake:  Today  Hydrocodone 17/45 Filled 10/17/223 today

## 2022-01-26 NOTE — Telephone Encounter (Signed)
Requested medication (s) are due for refill today: yes  Requested medication (s) are on the active medication list: yes  Last refill:  12/18/21  #60    Future visit scheduled: yes  Notes to clinic:  med not delegated to NT to refill   Requested Prescriptions  Pending Prescriptions Disp Refills   Diclofenac-miSOPROStol 75-0.2 MG TBEC [Pharmacy Med Name: DICLOFENAC-MISOPROSTOL 75-0.2 MG DR] 60 tablet 0    Sig: TAKE (1) TABLET BY MOUTH TWICE DAILY     Not Delegated - Analgesics:  Antirheumatic Agents - diclofenac/misoprostol Failed - 01/26/2022  9:55 AM      Failed - This refill cannot be delegated      Failed - Cr in normal range and within 360 days    Creatinine, Ser  Date Value Ref Range Status  03/31/2021 1.03 (H) 0.57 - 1.00 mg/dL Final         Passed - HGB in normal range and within 360 days    Hemoglobin  Date Value Ref Range Status  03/31/2021 12.3 11.1 - 15.9 g/dL Final         Passed - Patient is not pregnant      Passed - Valid encounter within last 12 months    Recent Outpatient Visits           3 months ago Arthralgia of left ankle   [place] [facility] at [place], Earley Abide, MD   10 months ago Benign essential HTN   [place] [facility] at [facility], Jesse Sans, MD   10 months ago Acute recurrent maxillary sinusitis   [place] [facility] at [facility], Jesse Sans, MD   1 year ago Primary osteoarthritis of right knee   [place] [facility] at [place], Earley Abide, MD   1 year ago Primary osteoarthritis of right knee   [facility] at [facility], Earley Abide, MD       Future Appointments             In 2 months Army Melia, Jesse Sans, MD [place] and [facility] Medicine at [facility], [facility]

## 2022-01-28 LAB — TOXASSURE SELECT 13 (MW), URINE

## 2022-01-30 ENCOUNTER — Other Ambulatory Visit: Payer: Self-pay | Admitting: Dermatology

## 2022-01-30 DIAGNOSIS — L219 Seborrheic dermatitis, unspecified: Secondary | ICD-10-CM

## 2022-02-09 ENCOUNTER — Ambulatory Visit (INDEPENDENT_AMBULATORY_CARE_PROVIDER_SITE_OTHER): Payer: Medicare Other | Admitting: Obstetrics and Gynecology

## 2022-02-09 ENCOUNTER — Encounter: Payer: Self-pay | Admitting: Obstetrics and Gynecology

## 2022-02-09 VITALS — BP 120/70 | Ht 68.0 in | Wt 180.0 lb

## 2022-02-09 DIAGNOSIS — I2510 Atherosclerotic heart disease of native coronary artery without angina pectoris: Secondary | ICD-10-CM | POA: Diagnosis not present

## 2022-02-09 DIAGNOSIS — N898 Other specified noninflammatory disorders of vagina: Secondary | ICD-10-CM | POA: Diagnosis not present

## 2022-02-09 DIAGNOSIS — Z87891 Personal history of nicotine dependence: Secondary | ICD-10-CM | POA: Diagnosis not present

## 2022-02-09 LAB — POCT WET PREP WITH KOH
Clue Cells Wet Prep HPF POC: NEGATIVE
KOH Prep POC: NEGATIVE
Trichomonas, UA: NEGATIVE
Yeast Wet Prep HPF POC: NEGATIVE

## 2022-02-09 MED ORDER — CLOTRIMAZOLE-BETAMETHASONE 1-0.05 % EX CREA: TOPICAL_CREAM | CUTANEOUS | 0 refills | Status: DC

## 2022-02-09 NOTE — Patient Instructions (Signed)
I value your feedback and you entrusting us with your care. If you get a [facility] patient survey, I would appreciate you taking the time to let us know about your experience today. Thank you! ? ? ?

## 2022-02-09 NOTE — Progress Notes (Signed)
Carrie Hess, MD   Chief Complaint  Patient presents with   Vaginal Itching    Irritation, spots in vaginal area that look like burns x 4 moths    HPI:      [name] is a 76 y.o. No obstetric history on file. whose LMP was No LMP recorded. Patient has had a hysterectomy., presents today for vaginal itching and feeling raw for 4 months. Pt wears depends when out and at night, but tries not to during day when at home. Cleans with warm water when changes pads. Treated with 1% hydrocortisone crm ext without relief a few months ago, tried monistat with significant burning. No prior abx. No vag d/c, odor. No UTI sx. No PMB.    Patient Active Problem List   Diagnosis Date Noted   Arthralgia of left ankle 10/20/2021   Osteopenia determined by x-ray 03/31/2021   Primary osteoarthritis of left knee 01/22/2021   Effusion, right knee 12/15/2020   Primary osteoarthritis of right knee 11/24/2020   Overactive bladder 06/18/2020   Prediabetes 04/14/2020   Slow transit constipation 06/05/2019   Sciatica, left side 01/01/2019   Diverticulosis of colon 04/25/2018   Change in bowel habits    Tobacco use disorder, moderate, in sustained remission 12/22/2017   Moderate episode of recurrent major depressive disorder ([place]) 10/03/2017   Inflammatory spondylopathy of lumbar region ([place]) 10/03/2017   Hoarseness, persistent 04/18/2017   OSA on CPAP 02/18/2017   Cervical disc disease 12/31/2016   Generalized osteoarthritis 12/23/2016   Chronic pain syndrome 12/23/2016   Cystocele, midline 12/06/2016   Urinary incontinence 12/06/2016   Impingement syndrome of right shoulder 04/17/2015   Fibrocystic breast 01/18/2015   Gastroesophageal reflux disease without esophagitis 01/18/2015   Hypothyroidism, postablative 01/18/2015   Arteriosclerosis of coronary artery 12/29/2014   DJD of shoulder 11/13/2014   Fibromyalgia 10/02/2014   Intercostal neuralgia 10/02/2014   Facet syndrome,  lumbar 10/02/2014   Sacroiliac joint disease 10/02/2014   Carotid artery narrowing 07/09/2014   Benign essential HTN 07/01/2014   Osteoarthritis of thumb 09/24/2012   Mixed hyperlipidemia 09/22/2012   Arthritis of hand, degenerative 09/22/2012    Past Surgical History:  Procedure Laterality Date   ABDOMINAL HYSTERECTOMY     total   APPENDECTOMY     BREAST BIOPSY Right    neg   BREAST CYST ASPIRATION Left    neg   BREAST EXCISIONAL BIOPSY Right    BUNIONECTOMY Bilateral    CESAREAN SECTION     COLONOSCOPY  2010   normal   COLONOSCOPY WITH PROPOFOL N/A 04/03/2018   Procedure: COLONOSCOPY WITH BIOPSIES;  Surgeon: Lucilla Lame, MD;  Location: [place];  Service: Endoscopy;  Laterality: N/A;  sleep apnea   CORONARY ANGIOPLASTY WITH STENT PLACEMENT  04/2006   dental implant     [age]   DILATION AND CURETTAGE OF UTERUS     epidural steroid injection  2018   ESOPHAGOGASTRODUODENOSCOPY N/A 10/03/2020   Procedure: ESOPHAGOGASTRODUODENOSCOPY (EGD);  Surgeon: Lesly Rubenstein, MD;  [facility] END[facility]COPY;  Service: Endoscopy;  Laterality: N/A;  REQUESTING MORNING AFTER 9 AM   EYE SURGERY     bilateral cataract extraction   SHOULDER ARTHROSCOPY Right 05/07/2015   Procedure: Extensive arthroscopic debridement and arthroscopic subacromial decompression, right shoulder.                        2.  Steroid injection right thumb CMC joint.;  Surgeon:  Corky Mull, MD;  Location: [place];  Service: Orthopedics;  Laterality: Right;   TOOTH EXTRACTION     Upper centrals and lateral    Family History  Problem Relation Age of Onset   Stroke Mother    Heart disease Father    Pancreatic cancer Sister    Diabetes Sister    Prostate cancer Brother    Breast cancer Neg Hx    Kidney cancer Neg Hx    Bladder Cancer Neg Hx     Social History   Socioeconomic History   Marital status: Widowed    Spouse name: Not on file   Number of children: 1   Years of education:  12   Highest education level: High school graduate  Occupational History   Occupation: retired   Tobacco Use   Smoking status: Former    Packs/day: 1.25    Years: 44.00    Total pack years: 55.00    Types: Cigarettes    Quit date: 04/22/2006    Years since quitting: 15.8   Smokeless tobacco: Never   Tobacco comments:       Vaping Use   Vaping Use: Never used  Substance and Sexual Activity   Alcohol use: Not Currently   Drug use: Never   Sexual activity: Not Currently    Partners: Male    Birth control/protection: None  Other Topics Concern   Not on file  Social History Narrative   Pt lives alone    Social Determinants of Health   Financial Resource Strain: Low Risk  (06/29/2021)   Overall Financial Resource Strain (CARDIA)    Difficulty of Paying Living Expenses: Not hard at all  Food Insecurity: No Food Insecurity (06/29/2021)   Hunger Vital Sign    Worried About Running Out of Food in the Last Year: Never true    [place] in the Last Year: Never true  Transportation Needs: No Transportation Needs (06/29/2021)   PRAPARE - Hydrologist (Medical): No    Lack of Transportation (Non-Medical): No  Physical Activity: Inactive (06/29/2021)   Exercise Vital Sign    Days of Exercise per Week: 0 days    Minutes of Exercise per Session: 0 min  Stress: No Stress Concern Present (06/29/2021)   [place]    Feeling of Stress : Not at all  Social Connections: Moderately Isolated (06/29/2021)   Social Connection and Isolation Panel [NHANES]    Frequency of Communication with Friends and Family: More than three times a week    Frequency of Social Gatherings with Friends and Family: Three times a week    Attends Religious Services: More than 4 times per year    Active Member of Clubs or Organizations: No    Attends Archivist Meetings: Never    Marital Status: Widowed   Intimate Partner Violence: Not At Risk (06/29/2021)   Humiliation, Afraid, Rape, and Kick questionnaire    Fear of Current or Ex-Partner: No    Emotionally Abused: No    Physically Abused: No    Sexually Abused: No    Outpatient Medications Prior to Visit  Medication Sig Dispense Refill   Armodafinil 150 MG tablet Take 150 mg by mouth daily. For fibromyalgia fatigue     aspirin EC 81 MG tablet Take 81 mg by mouth daily.     citalopram (CELEXA) 20 MG tablet TAKE 1 TABLET BY MOUTH  DAILY 90 tablet 3   cyclobenzaprine (FLEXERIL) 10 MG tablet Take 10 mg by mouth 2 (two) times daily.     diazepam (VALIUM) 5 MG tablet Take 1 tablet (5 mg total) by mouth at bedtime as needed for anxiety. 30 tablet 3   DULoxetine (CYMBALTA) 60 MG capsule TAKE 1 CAPSULE BY MOUTH DAILY 90 capsule 0   fluticasone-salmeterol (ADVAIR HFA) 115-21 MCG/ACT inhaler Inhale into the lungs 2 (two) times daily.     [START ON 02/27/2022] HYDROcodone-acetaminophen (NORCO/VICODIN) 5-325 MG tablet Take 1 tablet by mouth 2 (two) times daily as needed for moderate pain. 45 tablet 0   levocetirizine (XYZAL) 5 MG tablet Take by mouth.     levothyroxine (SYNTHROID) 88 MCG tablet TAKE 1 TABLET BY MOUTH DAILY 90 tablet 1   Loperamide HCl (IMODIUM PO) Take 1 tablet by mouth as needed.     losartan (COZAAR) 100 MG tablet Take 100 mg by mouth daily.     Multiple Vitamin (MULTI-VITAMINS) TABS Take by mouth.     NON FORMULARY at bedtime. CPAP @ 11 cm H2O with Oxygen     pantoprazole (PROTONIX) 40 MG tablet Take 1 tablet by mouth 2 (two) times daily.     pravastatin (PRAVACHOL) 80 MG tablet Take 80 mg by mouth daily.     pregabalin (LYRICA) 50 MG capsule Take 50 mg by mouth 2 (two) times daily.     losartan (COZAAR) 50 MG tablet Take 100 mg by mouth daily.     diclofenac (VOLTAREN) 75 MG EC tablet TAKE (1) TABLET BY MOUTH TWICE DAILY (Patient not taking: Reported on 02/09/2022) 60 tablet 2   Diclofenac-miSOPROStol 75-0.2 MG TBEC TAKE (1)  TABLET BY MOUTH TWICE DAILY (Patient not taking: Reported on 01/06/2022) 60 tablet 0   HYDROcodone-acetaminophen (NORCO/VICODIN) 5-325 MG tablet Take 1 tablet by mouth 2 (two) times daily as needed for moderate pain or severe pain. 45 tablet 0   hydrocortisone 2.5 % cream Apply topically 2 (two) times daily as needed (Rash). 30 g 2   ketoconazole (NIZORAL) 2 % shampoo 1-2 times a week lather on scalp/forehead, leave on 5-8 minutes, rinse well 120 mL 3   mirabegron ER (MYRBETRIQ) 50 MG TB24 tablet Take 1 tablet (50 mg total) by mouth daily. 30 tablet 11   pimecrolimus (ELIDEL) 1 % cream Apply BID to affected areas on face/eyelids 30 g 2   No facility-administered medications prior to visit.      ROS:  Review of Systems  Constitutional:  Negative for fever.  Gastrointestinal:  Negative for blood in stool, constipation, diarrhea, nausea and vomiting.  Genitourinary:  Positive for vaginal pain. Negative for dyspareunia, dysuria, flank pain, frequency, hematuria, urgency, vaginal bleeding and vaginal discharge.  Musculoskeletal:  Negative for back pain.  Skin:  Negative for rash.   BREAST: No symptoms   OBJECTIVE:   Vitals:  BP 120/70   Ht '5\' 8"'$  (1.727 m)   Wt 180 lb (81.6 kg)   BMI 27.37 kg/m   Physical Exam Vitals reviewed.  Constitutional:      Appearance: She is well-developed.  Pulmonary:     Effort: Pulmonary effort is normal.  Genitourinary:    General: Normal vulva.     Pubic Area: No rash.      Labia:        Right: Rash and tenderness present. No lesion.        Left: Rash and tenderness present. No lesion.      Vagina: Normal. No  vaginal discharge, erythema or tenderness.     Adnexa:        Left: No mass.      Musculoskeletal:        General: Normal range of motion.     Cervical back: Normal range of motion.  Skin:    General: Skin is warm and dry.  Neurological:     General: No focal deficit present.     Mental Status: She is alert and oriented to person,  place, and time.  Psychiatric:        Mood and Affect: Mood normal.        Behavior: Behavior normal.        Thought Content: Thought content normal.        Judgment: Judgment normal.     Results: Results for orders placed or performed in visit on 02/09/22 (from the past 24 hour(s))  POCT Wet Prep with KOH     Status: Normal   Collection Time: 02/09/22  5:10 PM  Result Value Ref Range   Trichomonas, UA Negative    Clue Cells Wet Prep HPF POC neg    Epithelial Wet Prep HPF POC     Yeast Wet Prep HPF POC neg    Bacteria Wet Prep HPF POC     RBC Wet Prep HPF POC     WBC Wet Prep HPF POC     KOH Prep POC Negative Negative     Assessment/Plan: Vaginal itching - Plan: clotrimazole-betamethasone (LOTRISONE) cream, POCT Wet Prep with KOH; net wet prep, pos exam. Sx either fungal vs LS. Treat with lotrisone crm BID for 2 wks, then RTO for f/u. If still sx, will do bx of LS. Keep area clean/dry.    Meds ordered this encounter  Medications   clotrimazole-betamethasone (LOTRISONE) cream    Sig: Apply externally BID for 2 wks    Dispense:  45 g    Refill:  0    Order Specific Question:   Supervising Provider    Answer:   Rubie Maid [[id number]]      Return in about 2 weeks (around 02/23/2022) for vaginitis f/u.  Bryttney Netzer B. Mak Bonny, PA-C 02/09/2022 5:10 PM

## 2022-02-23 ENCOUNTER — Ambulatory Visit (INDEPENDENT_AMBULATORY_CARE_PROVIDER_SITE_OTHER): Payer: Medicare Other | Admitting: Internal Medicine

## 2022-02-23 ENCOUNTER — Encounter: Payer: Self-pay | Admitting: Internal Medicine

## 2022-02-23 VITALS — BP 128/70 | HR 106 | Temp 99.8°F | Ht 68.0 in | Wt 179.0 lb

## 2022-02-23 DIAGNOSIS — R059 Cough, unspecified: Secondary | ICD-10-CM | POA: Diagnosis not present

## 2022-02-23 DIAGNOSIS — R509 Fever, unspecified: Secondary | ICD-10-CM | POA: Diagnosis not present

## 2022-02-23 DIAGNOSIS — M1711 Unilateral primary osteoarthritis, right knee: Secondary | ICD-10-CM | POA: Diagnosis not present

## 2022-02-23 DIAGNOSIS — J101 Influenza due to other identified influenza virus with other respiratory manifestations: Secondary | ICD-10-CM | POA: Diagnosis not present

## 2022-02-23 DIAGNOSIS — I2510 Atherosclerotic heart disease of native coronary artery without angina pectoris: Secondary | ICD-10-CM

## 2022-02-23 DIAGNOSIS — M797 Fibromyalgia: Secondary | ICD-10-CM | POA: Diagnosis not present

## 2022-02-23 LAB — POCT INFLUENZA A/B
Influenza A, POC: POSITIVE — AB
Influenza B, POC: NEGATIVE

## 2022-02-23 LAB — POC COVID19 BINAXNOW: SARS Coronavirus 2 Ag: NEGATIVE

## 2022-02-23 MED ORDER — OSELTAMIVIR PHOSPHATE 75 MG PO CAPS: 75.0000 mg | ORAL_CAPSULE | Freq: Two times a day (BID) | ORAL | 0 refills | Status: DC

## 2022-02-23 MED ORDER — DICLOFENAC-MISOPROSTOL 75-0.2 MG PO TBEC: DELAYED_RELEASE_TABLET | ORAL | 5 refills | Status: DC

## 2022-02-23 MED ORDER — PROMETHAZINE-DM 6.25-15 MG/5ML PO SYRP: 5.0000 mL | ORAL_SOLUTION | Freq: Four times a day (QID) | ORAL | 0 refills | Status: AC | PRN

## 2022-02-23 NOTE — Patient Instructions (Signed)
Take Tylenol every 4-6 hours for headache and body aches.  Push fluids  Take Mucinex DM as directed on the package.

## 2022-02-23 NOTE — Progress Notes (Signed)
Date:  02/23/2022   Name:  Carrie Myers   DOB:  November 06, 1945   MRN:  [id number]   Chief Complaint: Cough  Cough This is a new problem. Episode onset: X 2 days. The problem has been gradually worsening. The problem occurs constantly. The cough is Non-productive. Associated symptoms include chills, a fever, headaches, myalgias, nasal congestion, postnasal drip, rhinorrhea and a sore throat. She has tried steroid inhaler for the symptoms. The treatment provided mild relief. Her past medical history is significant for emphysema.   Generalized OA - was previously on Arthrotec but I assume that insurance denied.  Voltaren sent in separately but she wants arthrotec back and will pay for it if needed.  Lab Results  Component Value Date   NA 142 03/31/2021   K 5.4 (H) 03/31/2021   CO2 27 03/31/2021   GLUCOSE 86 03/31/2021   BUN 24 03/31/2021   CREATININE 1.03 (H) 03/31/2021   CALCIUM 8.7 03/31/2021   EGFR 57 (L) 03/31/2021   GFRNONAA 57 (L) 03/28/2020   Lab Results  Component Value Date   CHOL 136 03/31/2021   HDL 60 03/31/2021   LDLCALC 60 03/31/2021   TRIG 85 03/31/2021   CHOLHDL 2.3 03/31/2021   Lab Results  Component Value Date   TSH 3.340 03/31/2021   Lab Results  Component Value Date   HGBA1C 6.1 (H) 09/05/2020   Lab Results  Component Value Date   WBC 5.0 03/31/2021   HGB 12.3 03/31/2021   HCT 37.0 03/31/2021   MCV 89 03/31/2021   PLT 261 03/31/2021   Lab Results  Component Value Date   ALT 26 03/31/2021   AST 21 03/31/2021   ALKPHOS 85 03/31/2021   BILITOT <0.2 03/31/2021   No results found for: "25OHVITD2", "25OHVITD3", "VD25OH"   Review of Systems  Constitutional:  Positive for chills, fatigue and fever.  HENT:  Positive for postnasal drip, rhinorrhea and sore throat.   Respiratory:  Positive for cough.   Gastrointestinal:  Positive for diarrhea, nausea and vomiting.  Musculoskeletal:  Positive for arthralgias, back pain, gait problem and  myalgias.  Neurological:  Positive for headaches.  Psychiatric/Behavioral:  Positive for sleep disturbance. Negative for dysphoric mood. The patient is not nervous/anxious.     Patient Active Problem List   Diagnosis Date Noted   Arthralgia of left ankle 10/20/2021   Osteopenia determined by x-ray 03/31/2021   Primary osteoarthritis of left knee 01/22/2021   Effusion, right knee 12/15/2020   Primary osteoarthritis of right knee 11/24/2020   Overactive bladder 06/18/2020   Prediabetes 04/14/2020   Slow transit constipation 06/05/2019   Sciatica, left side 01/01/2019   Diverticulosis of colon 04/25/2018   Change in bowel habits    Tobacco use disorder, moderate, in sustained remission 12/22/2017   Moderate episode of recurrent major depressive disorder ([place]) 10/03/2017   Inflammatory spondylopathy of lumbar region ([place]) 10/03/2017   Hoarseness, persistent 04/18/2017   OSA on CPAP 02/18/2017   Cervical disc disease 12/31/2016   Generalized osteoarthritis 12/23/2016   Chronic pain syndrome 12/23/2016   Cystocele, midline 12/06/2016   Urinary incontinence 12/06/2016   Impingement syndrome of right shoulder 04/17/2015   Fibrocystic breast 01/18/2015   Gastroesophageal reflux disease without esophagitis 01/18/2015   Hypothyroidism, postablative 01/18/2015   Arteriosclerosis of coronary artery 12/29/2014   DJD of shoulder 11/13/2014   Fibromyalgia 10/02/2014   Intercostal neuralgia 10/02/2014   Facet syndrome, lumbar 10/02/2014   Sacroiliac joint disease 10/02/2014   Carotid  artery narrowing 07/09/2014   Benign essential HTN 07/01/2014   Osteoarthritis of thumb 09/24/2012   Mixed hyperlipidemia 09/22/2012   Arthritis of hand, degenerative 09/22/2012    Allergies  Allergen Reactions   Amoxicillin-Pot Clavulanate Nausea And Vomiting and Other (See Comments)   Codeine Nausea And Vomiting and Other (See Comments)   Sulfa Antibiotics Nausea And Vomiting   Proparacaine Itching    Shellfish Allergy Nausea And Vomiting    No issues with iodine     Past Surgical History:  Procedure Laterality Date   ABDOMINAL HYSTERECTOMY     total   APPENDECTOMY     BREAST BIOPSY Right    neg   BREAST CYST ASPIRATION Left    neg   BREAST EXCISIONAL BIOPSY Right    BUNIONECTOMY Bilateral    CESAREAN SECTION     COLONOSCOPY  2010   normal   COLONOSCOPY WITH PROPOFOL N/A 04/03/2018   Procedure: COLONOSCOPY WITH BIOPSIES;  Surgeon: Lucilla Lame, MD;  Location: [place];  Service: Endoscopy;  Laterality: N/A;  sleep apnea   CORONARY ANGIOPLASTY WITH STENT PLACEMENT  04/2006   dental implant     [age]   DILATION AND CURETTAGE OF UTERUS     epidural steroid injection  2018   ESOPHAGOGASTRODUODENOSCOPY N/A 10/03/2020   Procedure: ESOPHAGOGASTRODUODENOSCOPY (EGD);  Surgeon: Lesly Rubenstein, MD;  Location: [facility] ENDOSCOPY;  Service: Endoscopy;  Laterality: N/A;  REQUESTING MORNING AFTER 9 AM   EYE SURGERY     bilateral cataract extraction   SHOULDER ARTHROSCOPY Right 05/07/2015   Procedure: Extensive arthroscopic debridement and arthroscopic subacromial decompression, right shoulder.                        2.  Steroid injection right thumb CMC joint.;  Surgeon: Corky Mull, MD;  Location: [place];  Service: Orthopedics;  Laterality: Right;   TOOTH EXTRACTION     Upper centrals and lateral    Social History   Tobacco Use   Smoking status: Former    Packs/day: 1.25    Years: 44.00    Total pack years: 55.00    Types: Cigarettes    Quit date: 04/22/2006    Years since quitting: 15.8   Smokeless tobacco: Never   Tobacco comments:       Vaping Use   Vaping Use: Never used  Substance Use Topics   Alcohol use: Not Currently   Drug use: Never     Medication list has been reviewed and updated.  Current Meds  Medication Sig   Armodafinil 150 MG tablet Take 150 mg by mouth daily. For fibromyalgia fatigue   aspirin EC 81 MG tablet Take 81 mg by  mouth daily.   citalopram (CELEXA) 20 MG tablet TAKE 1 TABLET BY MOUTH  DAILY   clotrimazole-betamethasone (LOTRISONE) cream Apply externally BID for 2 wks   cyclobenzaprine (FLEXERIL) 10 MG tablet Take 10 mg by mouth 2 (two) times daily.   diazepam (VALIUM) 5 MG tablet Take 1 tablet (5 mg total) by mouth at bedtime as needed for anxiety.   diclofenac (VOLTAREN) 75 MG EC tablet TAKE (1) TABLET BY MOUTH TWICE DAILY   Diclofenac-miSOPROStol 75-0.2 MG TBEC TAKE (1) TABLET BY MOUTH TWICE DAILY   DULoxetine (CYMBALTA) 60 MG capsule TAKE 1 CAPSULE BY MOUTH DAILY   fluticasone-salmeterol (ADVAIR HFA) 115-21 MCG/ACT inhaler Inhale into the lungs 2 (two) times daily.   [START ON 02/27/2022] HYDROcodone-acetaminophen (NORCO/VICODIN) 5-325 MG tablet  Take 1 tablet by mouth 2 (two) times daily as needed for moderate pain.   levocetirizine (XYZAL) 5 MG tablet Take by mouth.   levothyroxine (SYNTHROID) 88 MCG tablet TAKE 1 TABLET BY MOUTH DAILY   Loperamide HCl (IMODIUM PO) Take 1 tablet by mouth as needed.   losartan (COZAAR) 100 MG tablet Take 100 mg by mouth daily.   Multiple Vitamin (MULTI-VITAMINS) TABS Take by mouth.   NON FORMULARY at bedtime. CPAP @ 11 cm H2O with Oxygen   pantoprazole (PROTONIX) 40 MG tablet Take 1 tablet by mouth 2 (two) times daily.   pravastatin (PRAVACHOL) 80 MG tablet Take 80 mg by mouth daily.   pregabalin (LYRICA) 50 MG capsule Take 50 mg by mouth 2 (two) times daily.       10/20/2021    3:44 PM 03/31/2021    9:17 AM 03/20/2021   11:04 AM 01/22/2021   11:46 AM  GAD 7 : Generalized Anxiety Score  Nervous, Anxious, on Edge 0 0 0 0  Control/stop worrying 0 0 0 0  Worry too much - different things 0 0 0 0  Trouble relaxing 0 0 0 0  Restless 0 0 0 0  Easily annoyed or irritable 0 0 0 0  Afraid - awful might happen 0 0 0 0  Total GAD 7 Score 0 0 0 0  Anxiety Difficulty Not difficult at all Not difficult at all Not difficult at all Not difficult at all       01/26/2022     1:06 PM 01/06/2022    1:43 PM 10/20/2021    3:44 PM  Depression screen PHQ 2/9  Decreased Interest 0 0 0  Down, Depressed, Hopeless 0 0 0  PHQ - 2 Score 0 0 0  Altered sleeping   0  Tired, decreased energy   3  Change in appetite   0  Feeling bad or failure about yourself    0  Trouble concentrating   0  Moving slowly or fidgety/restless   0  Suicidal thoughts   0  PHQ-9 Score   3  Difficult doing work/chores   Not difficult at all    BP Readings from Last 3 Encounters:  02/23/22 128/70  02/09/22 120/70  01/26/22 (!) 148/73    Physical Exam Vitals and nursing note reviewed.  Constitutional:      General: She is not in acute distress.    Appearance: She is well-developed. She is ill-appearing.  HENT:     Head: Normocephalic and atraumatic.  Neck:     Vascular: No carotid bruit.  Cardiovascular:     Rate and Rhythm: Normal rate and regular rhythm.  Pulmonary:     Effort: Pulmonary effort is normal. No respiratory distress.     Breath sounds: Normal breath sounds. No decreased breath sounds, wheezing or rhonchi.     Comments: Frequent dry cough Musculoskeletal:     Cervical back: Normal range of motion.  Lymphadenopathy:     Cervical: No cervical adenopathy.  Skin:    General: Skin is warm and dry.     Findings: No rash.  Neurological:     Mental Status: She is alert and oriented to person, place, and time.  Psychiatric:        Mood and Affect: Mood normal.        Behavior: Behavior normal.     Wt Readings from Last 3 Encounters:  02/23/22 179 lb (81.2 kg)  02/09/22 180 lb (81.6 kg)  01/26/22  182 lb (82.6 kg)    BP 128/70   Pulse (!) 106   Temp 99.8 F (37.7 C) (Oral)   Ht _0  (1.727 m)   Wt 179 lb (81.2 kg)   SpO2 97%   BMI 27.22 kg/m   Assessment and Plan: 1. Influenza A Recommend tylenol q 6 hours Push fluids - oseltamivir (TAMIFLU) 75 MG capsule; Take 1 capsule (75 mg total) by mouth 2 (two) times daily.  Dispense: 10 capsule; Refill:  0  2. Cough, unspecified type Due to Influenza A - POC COVID-19 - POCT Influenza A/B - promethazine-dextromethorphan (PROMETHAZINE-DM) 6.25-15 MG/5ML syrup; Take 5 mLs by mouth 4 (four) times daily as needed for up to 9 days for cough.  Dispense: 118 mL; Refill: 0  3. Fever, unspecified fever cause - POC COVID-19 - negative - POCT Influenza A/B - +A  4. Primary osteoarthritis of right knee Resume Arthrotec bid  5. Fibromyalgia - Diclofenac-miSOPROStol 75-0.2 MG TBEC; TAKE (1) TABLET BY MOUTH TWICE DAILY  Dispense: 60 tablet; Refill: 5   Partially dictated using Editor, commissioning. Any errors are unintentional.  Halina Maidens, MD [facility]  02/23/2022

## 2022-03-01 ENCOUNTER — Ambulatory Visit: Payer: Self-pay | Admitting: *Deleted

## 2022-03-01 ENCOUNTER — Ambulatory Visit: Payer: Medicare Other | Admitting: Obstetrics and Gynecology

## 2022-03-01 NOTE — Telephone Encounter (Signed)
Please review.  KP

## 2022-03-01 NOTE — Telephone Encounter (Signed)
Pt informed and verbalized understanding.  KP 

## 2022-03-01 NOTE — Telephone Encounter (Signed)
Summary: Coughing & Congestion Advice   Pt is calling to report that she was seen in office 02/23/22 for influenza. Pt feels that the flu is over. Pt is still coughing with phelm and congestion, and her throat is sore. The coughing is keeping her up at night.           Chief Complaint: coughing spells keeping up at night Symptoms: coughing spells , sore throat .  Frequency: since 02/23/22 Pertinent Negatives: Patient denies chest pain, no difficulty breathing no fever, no vomiting reported after coughing Disposition: '[]'$ ED /'[]'$ Urgent Care (no appt availability in office) / '[]'$ Appointment(In office/virtual)/ '[]'$  [place] [facility]/ '[]'$ Home Care/ '[]'$ Refused Recommended Disposition /'[]'$  Mobile Bus/ '[x]'$  Follow-up with PCP Additional Notes:   Last seen for similar sx. 02/23/22. Please advise if another appt needed. Patient requesting a call back. Please advise OTC recommendations.       Reason for Disposition  SEVERE coughing spells (e.g., whooping sound after coughing, vomiting after coughing)  Answer Assessment - Initial Assessment Questions 1. ONSET: "When did the cough begin?"      02/23/22 2. SEVERITY: "How bad is the cough today?"      Coughing spells at night  3. SPUTUM: "Describe the color of your sputum" (none, dry cough; clear, white, yellow, green)     None  4. HEMOPTYSIS: "Are you coughing up any blood?" If so ask: "How much?" (flecks, streaks, tablespoons, etc.)     na 5. DIFFICULTY BREATHING: "Are you having difficulty breathing?" If Yes, ask: "How bad is it?" (e.g., mild, moderate, severe)    - MILD: No SOB at rest, mild SOB with walking, speaks normally in sentences, can lie down, no retractions, pulse < 100.    - MODERATE: SOB at rest, SOB with minimal exertion and prefers to sit, cannot lie down flat, speaks in phrases, mild retractions, audible wheezing, pulse 100-120.    - SEVERE: Very SOB at rest, speaks in single words, struggling to breathe, sitting  hunched forward, retractions, pulse > 120      No  6. FEVER: "Do you have a fever?" If Yes, ask: "What is your temperature, how was it measured, and when did it start?"     na 7. CARDIAC HISTORY: "Do you have any history of heart disease?" (e.g., heart attack, congestive heart failure)      na 8. LUNG HISTORY: "Do you have any history of lung disease?"  (e.g., pulmonary embolus, asthma, emphysema)     na 9. PE RISK FACTORS: "Do you have a history of blood clots?" (or: recent major surgery, recent prolonged travel, bedridden)     na 10. OTHER SYMPTOMS: "Do you have any other symptoms?" (e.g., runny nose, wheezing, chest pain)       Nose feels stopped up head full and coughing spells worse. Sore throat from coughing  11. PREGNANCY: "Is there any chance you are pregnant?" "When was your last menstrual period?"       na 12. TRAVEL: "Have you traveled out of the country in the last month?" (e.g., travel history, exposures)       na  Protocols used: Cough - Acute Non-Productive-A-AH

## 2022-03-03 ENCOUNTER — Other Ambulatory Visit: Payer: Self-pay | Admitting: Internal Medicine

## 2022-03-03 DIAGNOSIS — F331 Major depressive disorder, recurrent, moderate: Secondary | ICD-10-CM

## 2022-03-12 ENCOUNTER — Ambulatory Visit: Payer: Medicare Other | Admitting: Internal Medicine

## 2022-03-25 NOTE — Progress Notes (Deleted)
Carrie Hess, MD   No chief complaint on file.   HPI:      Carrie Myers is a 77 y.o. G1P1001 whose LMP was No LMP recorded. Patient has had a hysterectomy., presents today for *** Vaginal itching f/u. Question LS vs funagl. Treated with lostrione BID for 2 wks.    Patient Active Problem List   Diagnosis Date Noted   Arthralgia of left ankle 10/20/2021   Osteopenia determined by x-ray 03/31/2021   Primary osteoarthritis of left knee 01/22/2021   Effusion, right knee 12/15/2020   Primary osteoarthritis of right knee 11/24/2020   Overactive bladder 06/18/2020   Prediabetes 04/14/2020   Slow transit constipation 06/05/2019   Sciatica, left side 01/01/2019   Diverticulosis of colon 04/25/2018   Change in bowel habits    Tobacco use disorder, moderate, in sustained remission 12/22/2017   Moderate episode of recurrent major depressive disorder ([place]) 10/03/2017   Inflammatory spondylopathy of lumbar region ([place]) 10/03/2017   Hoarseness, persistent 04/18/2017   OSA on CPAP 02/18/2017   Cervical disc disease 12/31/2016   Generalized osteoarthritis 12/23/2016   Chronic pain syndrome 12/23/2016   Cystocele, midline 12/06/2016   Urinary incontinence 12/06/2016   Impingement syndrome of right shoulder 04/17/2015   Fibrocystic breast 01/18/2015   Gastroesophageal reflux disease without esophagitis 01/18/2015   Hypothyroidism, postablative 01/18/2015   Arteriosclerosis of coronary artery 12/29/2014   DJD of shoulder 11/13/2014   Fibromyalgia 10/02/2014   Intercostal neuralgia 10/02/2014   Facet syndrome, lumbar 10/02/2014   Sacroiliac joint disease 10/02/2014   Carotid artery narrowing 07/09/2014   Benign essential HTN 07/01/2014   Osteoarthritis of thumb 09/24/2012   Mixed hyperlipidemia 09/22/2012   Arthritis of hand, degenerative 09/22/2012    Past Surgical History:  Procedure Laterality Date   ABDOMINAL HYSTERECTOMY     total   APPENDECTOMY     BREAST  BIOPSY Right    neg   BREAST CYST ASPIRATION Left    neg   BREAST EXCISIONAL BIOPSY Right    BUNIONECTOMY Bilateral    CESAREAN SECTION     COLONOSCOPY  2010   normal   COLONOSCOPY WITH PROPOFOL N/A 04/03/2018   Procedure: COLONOSCOPY WITH BIOPSIES;  Surgeon: Lucilla Lame, MD;  Location: [facility];  Service: Endoscopy;  Laterality: N/A;  sleep apnea   CORONARY ANGIOPLASTY WITH STENT PLACEMENT  04/2006   dental implant     [age]   DILATION AND CURETTAGE OF UTERUS     epidural steroid injection  2018   ESOPHAGOGASTRODUODENOSCOPY N/A 10/03/2020   Procedure: ESOPHAGOGASTRODUODENOSCOPY (EGD);  Surgeon: Lesly Rubenstein, MD;  Location: [facility] ENDOSCOPY;  Service: Endoscopy;  Laterality: N/A;  REQUESTING MORNING AFTER 9 AM   EYE SURGERY     bilateral cataract extraction   SHOULDER ARTHROSCOPY Right 05/07/2015   Procedure: Extensive arthroscopic debridement and arthroscopic subacromial decompression, right shoulder.                        2.  Steroid injection right thumb CMC joint.;  Surgeon: Corky Mull, MD;  Location: [place];  Service: Orthopedics;  Laterality: Right;   TOOTH EXTRACTION     Upper centrals and lateral    Family History  Problem Relation Age of Onset   Stroke Mother    Heart disease Father    Pancreatic cancer Sister    Diabetes Sister    Prostate cancer Brother    Breast cancer Neg  Hx    Kidney cancer Neg Hx    Bladder Cancer Neg Hx     Social History   Socioeconomic History   Marital status: Widowed    Spouse name: Not on file   Number of children: 1   Years of education: 12   Highest education level: High school graduate  Occupational History   Occupation: retired   Tobacco Use   Smoking status: Former    Packs/day: 1.25    Years: 44.00    Total pack years: 55.00    Types: Cigarettes    Quit date: 04/22/2006    Years since quitting: 15.9   Smokeless tobacco: Never   Tobacco comments:       Vaping Use   Vaping Use: Never  used  Substance and Sexual Activity   Alcohol use: Not Currently   Drug use: Never   Sexual activity: Not Currently    Partners: Male    Birth control/protection: None  Other Topics Concern   Not on file  Social History Narrative   Pt lives alone    Social Determinants of Health   Financial Resource Strain: Low Risk  (02/23/2022)   Overall Financial Resource Strain (CARDIA)    Difficulty of Paying Living Expenses: Not hard at all  Food Insecurity: No [place] (02/23/2022)   Hunger Vital Sign    Worried About Running Out of Food in the Last Year: Never true    [place] in the Last Year: Never true  Transportation Needs: No Transportation Needs (02/23/2022)   PRAPARE - Hydrologist (Medical): No    Lack of Transportation (Non-Medical): No  Physical Activity: Inactive (06/29/2021)   Exercise Vital Sign    Days of Exercise per Week: 0 days    Minutes of Exercise per Session: 0 min  Stress: No Stress Concern Present (06/29/2021)   [place]    Feeling of Stress : Not at all  Social Connections: Moderately Isolated (06/29/2021)   Social Connection and Isolation Panel [NHANES]    Frequency of Communication with Friends and Family: More than three times a week    Frequency of Social Gatherings with Friends and Family: Three times a week    Attends Religious Services: More than 4 times per year    Active Member of Clubs or Organizations: No    Attends Archivist Meetings: Never    Marital Status: Widowed  Intimate Partner Violence: Not At Risk (02/23/2022)   Humiliation, Afraid, Rape, and Kick questionnaire    Fear of Current or Ex-Partner: No    Emotionally Abused: No    Physically Abused: No    Sexually Abused: No    Outpatient Medications Prior to Visit  Medication Sig Dispense Refill   Armodafinil 150 MG tablet Take 150 mg by mouth daily. For fibromyalgia  fatigue     aspirin EC 81 MG tablet Take 81 mg by mouth daily.     citalopram (CELEXA) 20 MG tablet TAKE 1 TABLET BY MOUTH  DAILY 90 tablet 3   clotrimazole-betamethasone (LOTRISONE) cream Apply externally BID for 2 wks 45 g 0   cyclobenzaprine (FLEXERIL) 10 MG tablet Take 10 mg by mouth 2 (two) times daily.     diazepam (VALIUM) 5 MG tablet Take 1 tablet (5 mg total) by mouth at bedtime as needed for anxiety. 30 tablet 3   Diclofenac-miSOPROStol 75-0.2 MG TBEC TAKE (1) TABLET BY MOUTH  TWICE DAILY 60 tablet 5   DULoxetine (CYMBALTA) 60 MG capsule TAKE 1 CAPSULE BY MOUTH DAILY 90 capsule 1   fluticasone-salmeterol (ADVAIR HFA) 115-21 MCG/ACT inhaler Inhale into the lungs 2 (two) times daily.     HYDROcodone-acetaminophen (NORCO/VICODIN) 5-325 MG tablet Take 1 tablet by mouth 2 (two) times daily as needed for moderate pain. 45 tablet 0   levocetirizine (XYZAL) 5 MG tablet Take by mouth.     levothyroxine (SYNTHROID) 88 MCG tablet TAKE 1 TABLET BY MOUTH DAILY 90 tablet 1   Loperamide HCl (IMODIUM PO) Take 1 tablet by mouth as needed.     losartan (COZAAR) 100 MG tablet Take 100 mg by mouth daily.     Multiple Vitamin (MULTI-VITAMINS) TABS Take by mouth.     NON FORMULARY at bedtime. CPAP @ 11 cm H2O with Oxygen     oseltamivir (TAMIFLU) 75 MG capsule Take 1 capsule (75 mg total) by mouth 2 (two) times daily. 10 capsule 0   pantoprazole (PROTONIX) 40 MG tablet Take 1 tablet by mouth 2 (two) times daily.     pravastatin (PRAVACHOL) 80 MG tablet Take 80 mg by mouth daily.     pregabalin (LYRICA) 50 MG capsule Take 50 mg by mouth 2 (two) times daily.     No facility-administered medications prior to visit.      ROS:  Review of Systems BREAST: No symptoms   OBJECTIVE:   Vitals:  There were no vitals taken for this visit.  Physical Exam  Results: No results found for this or any previous visit (from the past 24 hour(s)).   Assessment/Plan: No diagnosis found.    No orders of  the defined types were placed in this encounter.     No follow-ups on file.  Tameya Kuznia B. Shanta Hartner, PA-C 03/25/2022 7:29 PM

## 2022-03-29 ENCOUNTER — Ambulatory Visit: Payer: Medicare Other | Admitting: Obstetrics and Gynecology

## 2022-03-31 ENCOUNTER — Other Ambulatory Visit: Payer: Self-pay | Admitting: Internal Medicine

## 2022-04-01 NOTE — Telephone Encounter (Signed)
Requested medication (s) are due for refill today: No  Requested medication (s) are on the active medication list: yes    Last refill: 10/26/21  #90  1 refill  Future visit scheduled yes 04/21/22  Notes to clinic: Labs will be due, pt has supply to last until appt   Requested Prescriptions  Pending Prescriptions Disp Refills   levothyroxine (SYNTHROID) 88 MCG tablet [Pharmacy Med Name: Levothyroxine Sodium 88 MCG Oral Tablet] 90 tablet 3    Sig: TAKE 1 TABLET BY MOUTH DAILY     Endocrinology:  Hypothyroid Agents Failed - 03/31/2022 11:48 PM      Failed - TSH in normal range and within 360 days    TSH  Date Value Ref Range Status  03/31/2021 3.340 0.450 - 4.500 uIU/mL Final         Passed - Valid encounter within last 12 months    Recent Outpatient Visits           1 month ago Influenza A   [place] [facility] at [facility], Jesse Sans, MD   5 months ago Arthralgia of left ankle   [place] [facility] at [place], Earley Abide, MD   1 year ago Benign essential HTN   [place] [facility] at [facility], Jesse Sans, MD   1 year ago Acute recurrent maxillary sinusitis    [facility] at [facility], Jesse Sans, MD   1 year ago Primary osteoarthritis of right knee   [facility] at [facility], Earley Abide, MD       Future Appointments             In 2 weeks Army Melia Jesse Sans, MD [place] and Sports Medicine at [facility], [facility]

## 2022-04-14 NOTE — Progress Notes (Unsigned)
Carrie Hess, MD   No chief complaint on file.   HPI:      [name] is a 77 y.o. No obstetric history on file. whose LMP was No LMP recorded. Patient has had a hysterectomy., presents today for vaginal itching and feeling raw for 4 months. Pt wears depends when out and at night, but tries not to during day when at home. Cleans with warm water when changes pads. Treated with 1% hydrocortisone crm ext without relief a few months ago, tried monistat with significant burning. No prior abx. No vag d/c, odor. No UTI sx. No PMB.    Patient Active Problem List   Diagnosis Date Noted   Arthralgia of left ankle 10/20/2021   Osteopenia determined by x-ray 03/31/2021   Primary osteoarthritis of left knee 01/22/2021   Effusion, right knee 12/15/2020   Primary osteoarthritis of right knee 11/24/2020   Overactive bladder 06/18/2020   Prediabetes 04/14/2020   Slow transit constipation 06/05/2019   Sciatica, left side 01/01/2019   Diverticulosis of colon 04/25/2018   Change in bowel habits    Tobacco use disorder, moderate, in sustained remission 12/22/2017   Moderate episode of recurrent major depressive disorder ([place]) 10/03/2017   Inflammatory spondylopathy of lumbar region ([place]) 10/03/2017   Hoarseness, persistent 04/18/2017   OSA on CPAP 02/18/2017   Cervical disc disease 12/31/2016   Generalized osteoarthritis 12/23/2016   Chronic pain syndrome 12/23/2016   Cystocele, midline 12/06/2016   Urinary incontinence 12/06/2016   Impingement syndrome of right shoulder 04/17/2015   Fibrocystic breast 01/18/2015   Gastroesophageal reflux disease without esophagitis 01/18/2015   Hypothyroidism, postablative 01/18/2015   Arteriosclerosis of coronary artery 12/29/2014   DJD of shoulder 11/13/2014   Fibromyalgia 10/02/2014   Intercostal neuralgia 10/02/2014   Facet syndrome, lumbar 10/02/2014   Sacroiliac joint disease 10/02/2014   Carotid artery narrowing 07/09/2014    Benign essential HTN 07/01/2014   Osteoarthritis of thumb 09/24/2012   Mixed hyperlipidemia 09/22/2012   Arthritis of hand, degenerative 09/22/2012    Past Surgical History:  Procedure Laterality Date   ABDOMINAL HYSTERECTOMY     total   APPENDECTOMY     BREAST BIOPSY Right    neg   BREAST CYST ASPIRATION Left    neg   BREAST EXCISIONAL BIOPSY Right    BUNIONECTOMY Bilateral    CESAREAN SECTION     COLONOSCOPY  2010   normal   COLONOSCOPY WITH PROPOFOL N/A 04/03/2018   Procedure: COLONOSCOPY WITH BIOPSIES;  Surgeon: Lucilla Lame, MD;  Location: [place];  Service: Endoscopy;  Laterality: N/A;  sleep apnea   CORONARY ANGIOPLASTY WITH STENT PLACEMENT  04/2006   dental implant     [age]   DILATION AND CURETTAGE OF UTERUS     epidural steroid injection  2018   ESOPHAGOGASTRODUODENOSCOPY N/A 10/03/2020   Procedure: ESOPHAGOGASTRODUODENOSCOPY (EGD);  Surgeon: Lesly Rubenstein, MD;  Location: [facility] ENDOSCOPY;  Service: Endoscopy;  Laterality: N/A;  REQUESTING MORNING AFTER 9 AM   EYE SURGERY     bilateral cataract extraction   SHOULDER ARTHROSCOPY Right 05/07/2015   Procedure: Extensive arthroscopic debridement and arthroscopic subacromial decompression, right shoulder.                        2.  Steroid injection right thumb CMC joint.;  Surgeon: Corky Mull, MD;  Location: [place];  Service: Orthopedics;  Laterality: Right;   TOOTH EXTRACTION  Upper centrals and lateral    Family History  Problem Relation Age of Onset   Stroke Mother    Heart disease Father    Pancreatic cancer Sister    Diabetes Sister    Prostate cancer Brother    Breast cancer Neg Hx    Kidney cancer Neg Hx    Bladder Cancer Neg Hx     Social History   Socioeconomic History   Marital status: Widowed    Spouse name: Not on file   Number of children: 1   Years of education: 12   Highest education level: High school graduate  Occupational History   Occupation: retired    Tobacco Use   Smoking status: Former    Packs/day: 1.25    Years: 44.00    Total pack years: 55.00    Types: Cigarettes    Quit date: 04/22/2006    Years since quitting: 15.9   Smokeless tobacco: Never   Tobacco comments:       Vaping Use   Vaping Use: Never used  Substance and Sexual Activity   Alcohol use: Not Currently   Drug use: Never   Sexual activity: Not Currently    Partners: Male    Birth control/protection: None  Other Topics Concern   Not on file  Social History Narrative   Pt lives alone    Social Determinants of Health   Financial Resource Strain: Low Risk  (02/23/2022)   Overall Financial Resource Strain (CARDIA)    Difficulty of Paying Living Expenses: Not hard at all  Food Insecurity: No [place] (02/23/2022)   Hunger Vital Sign    Worried About Running Out of Food in the Last Year: Never true    [place] in the Last Year: Never true  Transportation Needs: No Transportation Needs (02/23/2022)   PRAPARE - Hydrologist (Medical): No    Lack of Transportation (Non-Medical): No  Physical Activity: Inactive (06/29/2021)   Exercise Vital Sign    Days of Exercise per Week: 0 days    Minutes of Exercise per Session: 0 min  Stress: No Stress Concern Present (06/29/2021)   [place]    Feeling of Stress : Not at all  Social Connections: Moderately Isolated (06/29/2021)   Social Connection and Isolation Panel [NHANES]    Frequency of Communication with Friends and Family: More than three times a week    Frequency of Social Gatherings with Friends and Family: Three times a week    Attends Religious Services: More than 4 times per year    Active Member of Clubs or Organizations: No    Attends Archivist Meetings: Never    Marital Status: Widowed  Intimate Partner Violence: Not At Risk (02/23/2022)   Humiliation, Afraid, Rape, and Kick  questionnaire    Fear of Current or Ex-Partner: No    Emotionally Abused: No    Physically Abused: No    Sexually Abused: No    Outpatient Medications Prior to Visit  Medication Sig Dispense Refill   Armodafinil 150 MG tablet Take 150 mg by mouth daily. For fibromyalgia fatigue     aspirin EC 81 MG tablet Take 81 mg by mouth daily.     citalopram (CELEXA) 20 MG tablet TAKE 1 TABLET BY MOUTH  DAILY 90 tablet 3   clotrimazole-betamethasone (LOTRISONE) cream Apply externally BID for 2 wks 45 g 0   cyclobenzaprine (FLEXERIL) 10  MG tablet Take 10 mg by mouth 2 (two) times daily.     diazepam (VALIUM) 5 MG tablet Take 1 tablet (5 mg total) by mouth at bedtime as needed for anxiety. 30 tablet 3   Diclofenac-miSOPROStol 75-0.2 MG TBEC TAKE (1) TABLET BY MOUTH TWICE DAILY 60 tablet 5   DULoxetine (CYMBALTA) 60 MG capsule TAKE 1 CAPSULE BY MOUTH DAILY 90 capsule 1   fluticasone-salmeterol (ADVAIR HFA) 115-21 MCG/ACT inhaler Inhale into the lungs 2 (two) times daily.     HYDROcodone-acetaminophen (NORCO/VICODIN) 5-325 MG tablet Take 1 tablet by mouth 2 (two) times daily as needed for moderate pain. 45 tablet 0   levocetirizine (XYZAL) 5 MG tablet Take by mouth.     levothyroxine (SYNTHROID) 88 MCG tablet TAKE 1 TABLET BY MOUTH DAILY 90 tablet 0   Loperamide HCl (IMODIUM PO) Take 1 tablet by mouth as needed.     losartan (COZAAR) 100 MG tablet Take 100 mg by mouth daily.     Multiple Vitamin (MULTI-VITAMINS) TABS Take by mouth.     NON FORMULARY at bedtime. CPAP @ 11 cm H2O with Oxygen     oseltamivir (TAMIFLU) 75 MG capsule Take 1 capsule (75 mg total) by mouth 2 (two) times daily. 10 capsule 0   pantoprazole (PROTONIX) 40 MG tablet Take 1 tablet by mouth 2 (two) times daily.     pravastatin (PRAVACHOL) 80 MG tablet Take 80 mg by mouth daily.     pregabalin (LYRICA) 50 MG capsule Take 50 mg by mouth 2 (two) times daily.     No facility-administered medications prior to visit.       ROS:  Review of Systems  Constitutional:  Negative for fever.  Gastrointestinal:  Negative for blood in stool, constipation, diarrhea, nausea and vomiting.  Genitourinary:  Positive for vaginal pain. Negative for dyspareunia, dysuria, flank pain, frequency, hematuria, urgency, vaginal bleeding and vaginal discharge.  Musculoskeletal:  Negative for back pain.  Skin:  Negative for rash.   BREAST: No symptoms   OBJECTIVE:   Vitals:  There were no vitals taken for this visit.  Physical Exam Vitals reviewed.  Constitutional:      Appearance: She is well-developed.  Pulmonary:     Effort: Pulmonary effort is normal.  Genitourinary:    General: Normal vulva.     Pubic Area: No rash.      Labia:        Right: Rash and tenderness present. No lesion.        Left: Rash and tenderness present. No lesion.      Vagina: Normal. No vaginal discharge, erythema or tenderness.     Adnexa:        Left: No mass.      Musculoskeletal:        General: Normal range of motion.     Cervical back: Normal range of motion.  Skin:    General: Skin is warm and dry.  Neurological:     General: No focal deficit present.     Mental Status: She is alert and oriented to person, place, and time.  Psychiatric:        Mood and Affect: Mood normal.        Behavior: Behavior normal.        Thought Content: Thought content normal.        Judgment: Judgment normal.     Results: No results found for this or any previous visit (from the past 24 hour(s)).    Assessment/Plan:  Vaginal itching - Plan: clotrimazole-betamethasone (LOTRISONE) cream, POCT Wet Prep with KOH; net wet prep, pos exam. Sx either fungal vs LS. Treat with lotrisone crm BID for 2 wks, then RTO for f/u. If still sx, will do bx of LS. Keep area clean/dry.    No orders of the defined types were placed in this encounter.     No follow-ups on file.  Abed Schar B. Masiyah Engen, PA-C 04/14/2022 9:16 PM

## 2022-04-15 ENCOUNTER — Ambulatory Visit (INDEPENDENT_AMBULATORY_CARE_PROVIDER_SITE_OTHER): Payer: Medicare Other | Admitting: Obstetrics and Gynecology

## 2022-04-15 ENCOUNTER — Other Ambulatory Visit (HOSPITAL_COMMUNITY)
Admission: RE | Admit: 2022-04-15 | Discharge: 2022-04-15 | Disposition: A | Discharge status: Home / Self Care | Payer: Medicare Other | Source: Ambulatory Visit | Attending: Obstetrics and Gynecology | Admitting: Obstetrics and Gynecology

## 2022-04-15 ENCOUNTER — Encounter: Payer: Self-pay | Admitting: Obstetrics and Gynecology

## 2022-04-15 VITALS — Ht 68.0 in | Wt 182.0 lb

## 2022-04-15 DIAGNOSIS — L900 Lichen sclerosus et atrophicus: Secondary | ICD-10-CM | POA: Diagnosis not present

## 2022-04-15 DIAGNOSIS — N898 Other specified noninflammatory disorders of vagina: Secondary | ICD-10-CM | POA: Insufficient documentation

## 2022-04-15 DIAGNOSIS — N9089 Other specified noninflammatory disorders of vulva and perineum: Secondary | ICD-10-CM | POA: Diagnosis not present

## 2022-04-15 DIAGNOSIS — N762 Acute vulvitis: Secondary | ICD-10-CM | POA: Diagnosis not present

## 2022-04-19 LAB — SURGICAL PATHOLOGY

## 2022-04-20 MED ORDER — CLOBETASOL PROPIONATE 0.05 % EX OINT: TOPICAL_OINTMENT | CUTANEOUS | 0 refills | Status: DC

## 2022-04-20 NOTE — Addendum Note (Signed)
Addended by: Ardeth Perfect B on: 03/16/4578 04:33 PM   Modules accepted: Orders

## 2022-04-21 ENCOUNTER — Encounter: Payer: Self-pay | Admitting: Internal Medicine

## 2022-04-21 ENCOUNTER — Ambulatory Visit (INDEPENDENT_AMBULATORY_CARE_PROVIDER_SITE_OTHER): Payer: Medicare Other | Admitting: Internal Medicine

## 2022-04-21 VITALS — BP 118/70 | HR 70 | Ht 68.0 in | Wt 181.0 lb

## 2022-04-21 DIAGNOSIS — E782 Mixed hyperlipidemia: Secondary | ICD-10-CM | POA: Diagnosis not present

## 2022-04-21 DIAGNOSIS — I10 Essential (primary) hypertension: Secondary | ICD-10-CM

## 2022-04-21 DIAGNOSIS — E890 Postprocedural hypothyroidism: Secondary | ICD-10-CM | POA: Diagnosis not present

## 2022-04-21 DIAGNOSIS — F331 Major depressive disorder, recurrent, moderate: Secondary | ICD-10-CM

## 2022-04-21 DIAGNOSIS — R7303 Prediabetes: Secondary | ICD-10-CM | POA: Diagnosis not present

## 2022-04-21 DIAGNOSIS — K219 Gastro-esophageal reflux disease without esophagitis: Secondary | ICD-10-CM | POA: Diagnosis not present

## 2022-04-21 MED ORDER — DULOXETINE HCL 30 MG PO CPEP: 90.0000 mg | ORAL_CAPSULE | Freq: Every day | ORAL | 0 refills | Status: DC

## 2022-04-21 MED ORDER — SUCRALFATE 1 GM/10ML PO SUSP: 1.0000 g | Freq: Three times a day (TID) | ORAL | 0 refills | Status: DC

## 2022-04-21 NOTE — Assessment & Plan Note (Signed)
Tolerating statin medication without side effects at this time LDL is  Lab Results  Component Value Date   LDLCALC 60 03/31/2021   with a goal of < 70. Current dose is appropriate but will be adjusted if needed.

## 2022-04-21 NOTE — Assessment & Plan Note (Signed)
supplemented

## 2022-04-21 NOTE — Assessment & Plan Note (Addendum)
Symptoms not well controlled on daily PPI with cough. Protonix bid No red flag signs such as weight loss, n/v, melena but she has daily severe reflux

## 2022-04-21 NOTE — Assessment & Plan Note (Signed)
Clinically stable exam with well controlled BP on losartan. Tolerating medications without side effects. Pt to continue current regimen and low sodium diet.  

## 2022-04-21 NOTE — Progress Notes (Signed)
Date:  04/21/2022   Name:  Carrie Myers   DOB:  Jan 16, 1946   MRN:  [id number]   Chief Complaint: Annual Exam Carrie Myers is a 77 y.o. female who presents today for her Complete Annual Exam. She feels poorly. She reports exercising- none. She reports she is sleeping well. Breast complaints - none.  Mammogram: 10/2021 DEXA: 05/2020 normal Colonoscopy: 03/2018 repeat 10 yrs  Health Maintenance Due  Topic Date Due   Zoster Vaccines- Shingrix (1 of 2) Never done   DTaP/Tdap/Td (2 - Td or Tdap) 09/07/2021   COVID-19 Vaccine (3 - 2023-24 season) 11/13/2021    Immunization History  Administered Date(s) Administered   Fluad Quad(high Dose 65+) 03/28/2019, 03/28/2020, 03/31/2021   Influenza, High Dose Seasonal PF 12/31/2016, 12/05/2017   Moderna Sars-Covid-2 Vaccination 11/09/2019, 12/13/2019   Pneumococcal Conjugate-13 01/30/2014   Pneumococcal Polysaccharide-23 09/22/2012   Tdap 09/08/2011    Hypertension This is a chronic problem. The problem is controlled. Pertinent negatives include no chest pain, headaches, palpitations or shortness of breath. Past treatments include angiotensin blockers. Identifiable causes of hypertension include a thyroid problem.  Hyperlipidemia The problem is controlled. Recent lipid tests were reviewed and are normal. Pertinent negatives include no chest pain or shortness of breath. Current antihyperlipidemic treatment includes statins.  Thyroid Problem Presents for follow-up visit. Symptoms include anxiety. Patient reports no constipation, diarrhea, fatigue, palpitations or tremors. The symptoms have been stable. Her past medical history is significant for hyperlipidemia.  Depression        This is a chronic problem.  The problem has been gradually worsening since onset.  Associated symptoms include no fatigue, no headaches and no suicidal ideas.  Past treatments include SSRIs - Selective serotonin reuptake inhibitors and SNRIs - Serotonin and  norepinephrine reuptake inhibitors.  Past medical history includes thyroid problem.     Lab Results  Component Value Date   NA 142 03/31/2021   K 5.4 (H) 03/31/2021   CO2 27 03/31/2021   GLUCOSE 86 03/31/2021   BUN 24 03/31/2021   CREATININE 1.03 (H) 03/31/2021   CALCIUM 8.7 03/31/2021   EGFR 57 (L) 03/31/2021   GFRNONAA 57 (L) 03/28/2020   Lab Results  Component Value Date   CHOL 136 03/31/2021   HDL 60 03/31/2021   LDLCALC 60 03/31/2021   TRIG 85 03/31/2021   CHOLHDL 2.3 03/31/2021   Lab Results  Component Value Date   TSH 3.340 03/31/2021   Lab Results  Component Value Date   HGBA1C 6.1 (H) 09/05/2020   Lab Results  Component Value Date   WBC 5.0 03/31/2021   HGB 12.3 03/31/2021   HCT 37.0 03/31/2021   MCV 89 03/31/2021   PLT 261 03/31/2021   Lab Results  Component Value Date   ALT 26 03/31/2021   AST 21 03/31/2021   ALKPHOS 85 03/31/2021   BILITOT <0.2 03/31/2021   No results found for: "25OHVITD2", "25OHVITD3", "VD25OH"   Review of Systems  Constitutional:  Negative for chills, fatigue and fever.  HENT:  Negative for congestion, hearing loss, tinnitus, trouble swallowing and voice change.   Eyes:  Negative for visual disturbance.  Respiratory:  Negative for cough, chest tightness, shortness of breath and wheezing.   Cardiovascular:  Negative for chest pain, palpitations and leg swelling.  Gastrointestinal:  Negative for abdominal pain, constipation, diarrhea and vomiting.  Endocrine: Negative for polydipsia and polyuria.  Genitourinary:  Negative for dysuria, frequency, genital sores, vaginal bleeding and vaginal discharge.  Musculoskeletal:  Negative for arthralgias, gait problem and joint swelling.  Skin:  Negative for color change and rash.  Neurological:  Negative for dizziness, tremors, light-headedness and headaches.  Hematological:  Negative for adenopathy. Does not bruise/bleed easily.  Psychiatric/Behavioral:  Positive for depression,  dysphoric mood and sleep disturbance. Negative for suicidal ideas. The patient is nervous/anxious.     Patient Active Problem List   Diagnosis Date Noted   Arthralgia of left ankle 10/20/2021   Osteopenia determined by x-ray 03/31/2021   Primary osteoarthritis of left knee 01/22/2021   Effusion, right knee 12/15/2020   Primary osteoarthritis of right knee 11/24/2020   Overactive bladder 06/18/2020   Prediabetes 04/14/2020   Slow transit constipation 06/05/2019   Sciatica, left side 01/01/2019   Diverticulosis of colon 04/25/2018   Change in bowel habits    Tobacco use disorder, moderate, in sustained remission 12/22/2017   Moderate episode of recurrent major depressive disorder ([place]) 10/03/2017   Inflammatory spondylopathy of lumbar region ([place]) 10/03/2017   Hoarseness, persistent 04/18/2017   OSA on CPAP 02/18/2017   Cervical disc disease 12/31/2016   Generalized osteoarthritis 12/23/2016   Chronic pain syndrome 12/23/2016   Cystocele, midline 12/06/2016   Urinary incontinence 12/06/2016   Impingement syndrome of right shoulder 04/17/2015   Fibrocystic breast 01/18/2015   Gastroesophageal reflux disease without esophagitis 01/18/2015   Hypothyroidism, postablative 01/18/2015   Arteriosclerosis of coronary artery 12/29/2014   DJD of shoulder 11/13/2014   Fibromyalgia 10/02/2014   Intercostal neuralgia 10/02/2014   Facet syndrome, lumbar 10/02/2014   Sacroiliac joint disease 10/02/2014   Carotid artery narrowing 07/09/2014   Benign essential HTN 07/01/2014   Osteoarthritis of thumb 09/24/2012   Mixed hyperlipidemia 09/22/2012   Arthritis of hand, degenerative 09/22/2012    Allergies  Allergen Reactions   Amoxicillin-Pot Clavulanate Nausea And Vomiting and Other (See Comments)   Codeine Nausea And Vomiting and Other (See Comments)   Sulfa Antibiotics Nausea And Vomiting   Proparacaine Itching   Shellfish Allergy Nausea And Vomiting    No issues with iodine     Past  Surgical History:  Procedure Laterality Date   ABDOMINAL HYSTERECTOMY     total   APPENDECTOMY     BREAST BIOPSY Right    neg   BREAST CYST ASPIRATION Left    neg   BREAST EXCISIONAL BIOPSY Right    BUNIONECTOMY Bilateral    CESAREAN SECTION     COLONOSCOPY  2010   normal   COLONOSCOPY WITH PROPOFOL N/A 04/03/2018   Procedure: COLONOSCOPY WITH BIOPSIES;  Surgeon: Lucilla Lame, MD;  Location: [facility];  Service: Endoscopy;  Laterality: N/A;  sleep apnea   CORONARY ANGIOPLASTY WITH STENT PLACEMENT  04/2006   dental implant     [age]   DILATION AND CURETTAGE OF UTERUS     epidural steroid injection  2018   ESOPHAGOGASTRODUODENOSCOPY N/A 10/03/2020   Procedure: ESOPHAGOGASTRODUODENOSCOPY (EGD);  Surgeon: Lesly Rubenstein, MD;  Location: [facility] ENDOSCOPY;  Service: Endoscopy;  Laterality: N/A;  REQUESTING MORNING AFTER 9 AM   EYE SURGERY     bilateral cataract extraction   SHOULDER ARTHROSCOPY Right 05/07/2015   Procedure: Extensive arthroscopic debridement and arthroscopic subacromial decompression, right shoulder.                        2.  Steroid injection right thumb CMC joint.;  Surgeon: Corky Mull, MD;  Location: [facility];  Service: Orthopedics;  Laterality: Right;   TOOTH  EXTRACTION     Upper centrals and lateral    Social History   Tobacco Use   Smoking status: Former    Packs/day: 1.25    Years: 44.00    Total pack years: 55.00    Types: Cigarettes    Quit date: 04/22/2006    Years since quitting: 16.0   Smokeless tobacco: Never   Tobacco comments:       Vaping Use   Vaping Use: Never used  Substance Use Topics   Alcohol use: Not Currently   Drug use: Never     Medication list has been reviewed and updated.  Current Meds  Medication Sig   Armodafinil 150 MG tablet Take 150 mg by mouth daily. For fibromyalgia fatigue   aspirin EC 81 MG tablet Take 81 mg by mouth daily.   clotrimazole-betamethasone (LOTRISONE) cream Apply externally  BID for 2 wks   diazepam (VALIUM) 5 MG tablet Take 1 tablet (5 mg total) by mouth at bedtime as needed for anxiety.   Diclofenac-miSOPROStol 75-0.2 MG TBEC TAKE (1) TABLET BY MOUTH TWICE DAILY   fluticasone-salmeterol (ADVAIR HFA) 115-21 MCG/ACT inhaler Inhale into the lungs 2 (two) times daily.   HYDROcodone-acetaminophen (NORCO/VICODIN) 5-325 MG tablet Take 1 tablet by mouth 2 (two) times daily as needed for moderate pain.   levocetirizine (XYZAL) 5 MG tablet Take by mouth.   levothyroxine (SYNTHROID) 88 MCG tablet TAKE 1 TABLET BY MOUTH DAILY   Loperamide HCl (IMODIUM PO) Take 1 tablet by mouth as needed.   losartan (COZAAR) 100 MG tablet Take 100 mg by mouth daily.   Multiple Vitamin (MULTI-VITAMINS) TABS Take by mouth.   NON FORMULARY at bedtime. CPAP @ 11 cm H2O with Oxygen   pantoprazole (PROTONIX) 40 MG tablet Take 1 tablet by mouth 2 (two) times daily.   pravastatin (PRAVACHOL) 80 MG tablet Take 80 mg by mouth daily.   sucralfate (CARAFATE) 1 GM/10ML suspension Take 10 mLs (1 g total) by mouth 4 (four) times daily -  with meals and at bedtime.   [DISCONTINUED] citalopram (CELEXA) 20 MG tablet TAKE 1 TABLET BY MOUTH  DAILY   [DISCONTINUED] clobetasol ointment (TEMOVATE) 0.05 % Apply to affected area every night for 4 weeks, then every other day for 4 weeks and then twice a week for 4 weeks or until resolution.   [DISCONTINUED] cyclobenzaprine (FLEXERIL) 10 MG tablet Take 10 mg by mouth 2 (two) times daily.   [DISCONTINUED] DULoxetine (CYMBALTA) 60 MG capsule TAKE 1 CAPSULE BY MOUTH DAILY       04/21/2022   10:59 AM 02/23/2022    1:59 PM 10/20/2021    3:44 PM 03/31/2021    9:17 AM  GAD 7 : Generalized Anxiety Score  Nervous, Anxious, on Edge 3 0 0 0  Control/stop worrying 3 0 0 0  Worry too much - different things 3 0 0 0  Trouble relaxing 0 0 0 0  Restless 0 0 0 0  Easily annoyed or irritable 2 0 0 0  Afraid - awful might happen 0 0 0 0  Total GAD 7 Score 11 0 0 0  Anxiety  Difficulty Very difficult Not difficult at all Not difficult at all Not difficult at all       04/21/2022   10:58 AM 02/23/2022    1:59 PM 01/26/2022    1:06 PM  Depression screen PHQ 2/9  Decreased Interest 2 0 0  Down, Depressed, Hopeless 1 0 0  PHQ - 2 Score 3  0 0  Altered sleeping 0 0   Tired, decreased energy 3 3   Change in appetite 3 0   Feeling bad or failure about yourself  0 0   Trouble concentrating 3 0   Moving slowly or fidgety/restless 0 0   Suicidal thoughts 0 0   PHQ-9 Score 12 3   Difficult doing work/chores Not difficult at all Not difficult at all     BP Readings from Last 3 Encounters:  04/21/22 118/70  02/23/22 128/70  02/09/22 120/70    Physical Exam Vitals and nursing note reviewed.  Constitutional:      General: She is not in acute distress.    Appearance: She is well-developed.  HENT:     Head: Normocephalic and atraumatic.     Right Ear: Tympanic membrane and ear canal normal.     Left Ear: Tympanic membrane and ear canal normal.     Nose:     Right Sinus: No maxillary sinus tenderness.     Left Sinus: No maxillary sinus tenderness.  Eyes:     General: No scleral icterus.       Right eye: No discharge.        Left eye: No discharge.     Conjunctiva/sclera: Conjunctivae normal.  Neck:     Thyroid: No thyromegaly.     Vascular: No carotid bruit.  Cardiovascular:     Rate and Rhythm: Normal rate and regular rhythm.     Pulses: Normal pulses.     Heart sounds: Normal heart sounds.  Pulmonary:     Effort: Pulmonary effort is normal. No respiratory distress.     Breath sounds: No wheezing.  Chest:  Breasts:    Right: No mass, nipple discharge, skin change or tenderness.     Left: No mass, nipple discharge, skin change or tenderness.  Abdominal:     General: Bowel sounds are normal.     Palpations: Abdomen is soft.     Tenderness: There is no abdominal tenderness.  Musculoskeletal:     Cervical back: Normal range of motion. No  erythema.     Right lower leg: No edema.     Left lower leg: No edema.  Lymphadenopathy:     Cervical: No cervical adenopathy.  Skin:    General: Skin is warm and dry.     Findings: No rash.  Neurological:     Mental Status: She is alert and oriented to person, place, and time.     Cranial Nerves: No cranial nerve deficit.     Sensory: No sensory deficit.     Deep Tendon Reflexes: Reflexes are normal and symmetric.  Psychiatric:        Attention and Perception: Attention normal.        Mood and Affect: Mood normal. Affect is tearful.        Behavior: Behavior normal.        Cognition and Memory: Cognition normal.     Wt Readings from Last 3 Encounters:  04/21/22 181 lb (82.1 kg)  04/15/22 182 lb (82.6 kg)  02/23/22 179 lb (81.2 kg)    BP 118/70   Pulse 70   Ht '5\' 8"'$  (1.727 m)   Wt 181 lb (82.1 kg)   BMI 27.52 kg/m   Assessment and Plan: Problem List Items Addressed This Visit       Cardiovascular and Mediastinum   Benign essential HTN - Primary (Chronic)    Clinically stable exam with well controlled BP on  losartan. Tolerating medications without side effects. Pt to continue current regimen and low sodium diet.       Relevant Orders   CBC with Differential/Platelet   Comprehensive metabolic panel   Urinalysis, Routine w reflex microscopic     Digestive   Gastroesophageal reflux disease without esophagitis (Chronic)    Symptoms not well controlled on daily PPI with cough. Protonix bid No red flag signs such as weight loss, n/v, melena but she has daily severe reflux        Relevant Medications   sucralfate (CARAFATE) 1 GM/10ML suspension   Other Relevant Orders   CBC with Differential/Platelet   Ambulatory referral to Gastroenterology     Endocrine   Hypothyroidism, postablative (Chronic)    supplemented      Relevant Orders   TSH + free T4     Other   Mixed hyperlipidemia (Chronic)    Tolerating statin medication without side effects at this  time LDL is  Lab Results  Component Value Date   LDLCALC 60 03/31/2021  with a goal of < 70. Current dose is appropriate but will be adjusted if needed.       Relevant Orders   Lipid panel   Moderate episode of recurrent major depressive disorder ([facility]) (Chronic)    Not coping well at this time - stressed by brother in ALF with dementia On Cymbalta 60 mg; Celexa stopped for unclear reason Will increase Cymbalta to 90 mg      Relevant Medications   DULoxetine (CYMBALTA) 30 MG capsule   Prediabetes (Chronic)    Controlled with diet and exercise Last A1C  6.1      Relevant Orders   Hemoglobin A1c     Partially dictated using Editor, commissioning. Any errors are unintentional.  Halina Maidens, MD [facility]  04/21/2022

## 2022-04-21 NOTE — Assessment & Plan Note (Signed)
Controlled with diet and exercise Last A1C  6.1

## 2022-04-21 NOTE — Assessment & Plan Note (Addendum)
Not coping well at this time - stressed by brother in ALF with dementia On Cymbalta 60 mg; Celexa stopped for unclear reason Will increase Cymbalta to 90 mg

## 2022-04-22 ENCOUNTER — Other Ambulatory Visit: Payer: Self-pay | Admitting: *Deleted

## 2022-04-22 ENCOUNTER — Telehealth: Payer: Self-pay | Admitting: Anesthesiology

## 2022-04-22 LAB — URINALYSIS, ROUTINE W REFLEX MICROSCOPIC
Bilirubin, UA: NEGATIVE
Glucose, UA: NEGATIVE
Nitrite, UA: NEGATIVE
Protein,UA: NEGATIVE
RBC, UA: NEGATIVE
Specific Gravity, UA: 1.021 (ref 1.005–1.030)
Urobilinogen, Ur: 0.2 mg/dL (ref 0.2–1.0)
pH, UA: 5.5 (ref 5.0–7.5)

## 2022-04-22 LAB — LIPID PANEL
Chol/HDL Ratio: 2.5 ratio (ref 0.0–4.4)
Cholesterol, Total: 140 mg/dL (ref 100–199)
HDL: 55 mg/dL (ref >39)
LDL Chol Calc (NIH): 64 mg/dL (ref 0–99)
Triglycerides: 120 mg/dL (ref 0–149)
VLDL Cholesterol Cal: 21 mg/dL (ref 5–40)

## 2022-04-22 LAB — COMPREHENSIVE METABOLIC PANEL
ALT: 26 IU/L (ref 0–32)
AST: 28 IU/L (ref 0–40)
Albumin/Globulin Ratio: 2.1 (ref 1.2–2.2)
Albumin: 4.7 g/dL (ref 3.8–4.8)
Alkaline Phosphatase: 82 IU/L (ref 44–121)
BUN/Creatinine Ratio: 24 (ref 12–28)
BUN: 21 mg/dL (ref 8–27)
Bilirubin Total: 0.3 mg/dL (ref 0.0–1.2)
CO2: 24 mmol/L (ref 20–29)
Calcium: 9.2 mg/dL (ref 8.7–10.3)
Chloride: 100 mmol/L (ref 96–106)
Creatinine, Ser: 0.87 mg/dL (ref 0.57–1.00)
Globulin, Total: 2.2 g/dL (ref 1.5–4.5)
Glucose: 94 mg/dL (ref 70–99)
Potassium: 4.4 mmol/L (ref 3.5–5.2)
Sodium: 140 mmol/L (ref 134–144)
Total Protein: 6.9 g/dL (ref 6.0–8.5)
eGFR: 70 mL/min/{1.73_m2} (ref >59)

## 2022-04-22 LAB — MICROSCOPIC EXAMINATION
Bacteria, UA: NONE SEEN
Casts: NONE SEEN /lpf

## 2022-04-22 LAB — HEMOGLOBIN A1C
Est. average glucose Bld gHb Est-mCnc: 134 mg/dL
Hgb A1c MFr Bld: 6.3 % — ABNORMAL HIGH (ref 4.8–5.6)

## 2022-04-22 LAB — CBC WITH DIFFERENTIAL/PLATELET
Basophils Absolute: 0.1 10*3/uL (ref 0.0–0.2)
Basos: 1 %
EOS (ABSOLUTE): 0.1 10*3/uL (ref 0.0–0.4)
Eos: 2 %
Hematocrit: 37.7 % (ref 34.0–46.6)
Hemoglobin: 12.2 g/dL (ref 11.1–15.9)
Immature Grans (Abs): 0 10*3/uL (ref 0.0–0.1)
Immature Granulocytes: 0 %
Lymphocytes Absolute: 1.4 10*3/uL (ref 0.7–3.1)
Lymphs: 27 %
MCH: 28.9 pg (ref 26.6–33.0)
MCHC: 32.4 g/dL (ref 31.5–35.7)
MCV: 89 fL (ref 79–97)
Monocytes Absolute: 0.4 10*3/uL (ref 0.1–0.9)
Monocytes: 8 %
Neutrophils Absolute: 3.2 10*3/uL (ref 1.4–7.0)
Neutrophils: 62 %
Platelets: 302 10*3/uL (ref 150–450)
RBC: 4.22 x10E6/uL (ref 3.77–5.28)
RDW: 12.4 % (ref 11.7–15.4)
WBC: 5.3 10*3/uL (ref 3.4–10.8)

## 2022-04-22 LAB — TSH+FREE T4
Free T4: 1.43 ng/dL (ref 0.82–1.77)
TSH: 1.88 u[IU]/mL (ref 0.450–4.500)

## 2022-04-22 MED ORDER — HYDROCODONE-ACETAMINOPHEN 5-325 MG PO TABS: 1.0000 | ORAL_TABLET | Freq: Two times a day (BID) | ORAL | 0 refills | Status: DC | PRN

## 2022-04-22 NOTE — Telephone Encounter (Signed)
Medication refill request sent.

## 2022-04-22 NOTE — Telephone Encounter (Signed)
PT was supposed to have [facility] appt last month, however patient call stated that's he will be out of medications by the end of next week.

## 2022-04-23 NOTE — Progress Notes (Signed)
Called pt left VM to call back.  PEC may give results if patient returns call - CRM created.  KP

## 2022-05-06 ENCOUNTER — Other Ambulatory Visit: Payer: Self-pay

## 2022-05-06 DIAGNOSIS — K219 Gastro-esophageal reflux disease without esophagitis: Secondary | ICD-10-CM

## 2022-05-10 ENCOUNTER — Ambulatory Visit: Payer: Medicare Other | Admitting: Anesthesiology

## 2022-05-11 ENCOUNTER — Telehealth: Payer: Self-pay | Admitting: Internal Medicine

## 2022-05-11 NOTE — Telephone Encounter (Signed)
Medication Refill - Medication: sucralfate (CARAFATE) 1 GM/10ML suspension   Has the patient contacted their pharmacy? No. (Agent: If no, request that the patient contact the pharmacy for the refill. If patient does not wish to contact the pharmacy document the reason why and proceed with request.) (Agent: If yes, when and what did the pharmacy advise?)  Preferred Pharmacy (with phone number or street name):  [place], [place] Phone: [phone]  Fax: [phone]     Has the patient been seen for an appointment in the last year OR does the patient have an upcoming appointment? Yes.    Agent: Please be advised that RX refills may take up to 3 business days. We ask that you follow-up with your pharmacy.

## 2022-05-11 NOTE — Telephone Encounter (Signed)
Pt wants to hold off on this refill until she speak with [facility] to see if they can get her in sooner than June / if not she will call back /please advise if needed

## 2022-05-11 NOTE — Telephone Encounter (Signed)
FYI  KP

## 2022-05-12 DIAGNOSIS — K219 Gastro-esophageal reflux disease without esophagitis: Secondary | ICD-10-CM | POA: Diagnosis not present

## 2022-05-12 DIAGNOSIS — R053 Chronic cough: Secondary | ICD-10-CM | POA: Diagnosis not present

## 2022-05-13 ENCOUNTER — Encounter: Payer: Self-pay | Admitting: Anesthesiology

## 2022-05-13 ENCOUNTER — Ambulatory Visit: Payer: Medicare Other | Attending: Anesthesiology | Admitting: Anesthesiology

## 2022-05-13 VITALS — BP 164/88 | HR 89 | Temp 96.9°F | Resp 16 | Ht 68.0 in | Wt 176.0 lb

## 2022-05-13 DIAGNOSIS — M5441 Lumbago with sciatica, right side: Secondary | ICD-10-CM | POA: Diagnosis present

## 2022-05-13 DIAGNOSIS — M797 Fibromyalgia: Secondary | ICD-10-CM | POA: Insufficient documentation

## 2022-05-13 DIAGNOSIS — M545 Low back pain, unspecified: Secondary | ICD-10-CM | POA: Insufficient documentation

## 2022-05-13 DIAGNOSIS — G8929 Other chronic pain: Secondary | ICD-10-CM | POA: Diagnosis present

## 2022-05-13 DIAGNOSIS — F1190 Opioid use, unspecified, uncomplicated: Secondary | ICD-10-CM | POA: Insufficient documentation

## 2022-05-13 DIAGNOSIS — M542 Cervicalgia: Secondary | ICD-10-CM | POA: Diagnosis present

## 2022-05-13 DIAGNOSIS — M47812 Spondylosis without myelopathy or radiculopathy, cervical region: Secondary | ICD-10-CM | POA: Insufficient documentation

## 2022-05-13 DIAGNOSIS — G894 Chronic pain syndrome: Secondary | ICD-10-CM | POA: Insufficient documentation

## 2022-05-13 DIAGNOSIS — M47816 Spondylosis without myelopathy or radiculopathy, lumbar region: Secondary | ICD-10-CM | POA: Diagnosis present

## 2022-05-13 DIAGNOSIS — M5442 Lumbago with sciatica, left side: Secondary | ICD-10-CM | POA: Diagnosis present

## 2022-05-13 MED ORDER — HYDROCODONE-ACETAMINOPHEN 5-325 MG PO TABS: 1.0000 | ORAL_TABLET | Freq: Two times a day (BID) | ORAL | 0 refills | Status: DC

## 2022-05-13 MED ORDER — DIAZEPAM 5 MG PO TABS: 5.0000 mg | ORAL_TABLET | Freq: Every evening | ORAL | 5 refills | Status: AC | PRN

## 2022-05-13 MED ORDER — HYDROCODONE-ACETAMINOPHEN 5-325 MG PO TABS: 1.0000 | ORAL_TABLET | Freq: Two times a day (BID) | ORAL | 0 refills | Status: DC | PRN

## 2022-05-13 MED ORDER — DIAZEPAM 5 MG PO TABS: 5.0000 mg | ORAL_TABLET | Freq: Every evening | ORAL | 5 refills | Status: DC | PRN

## 2022-05-13 NOTE — Progress Notes (Signed)
Nursing Pain Medication Assessment:  Safety precautions to be maintained throughout the outpatient stay will include: orient to surroundings, keep bed in low position, maintain call bell within reach at all times, provide assistance with transfer out of bed and ambulation.  Medication Inspection Compliance: Ms. Waner did not comply with our request to bring her pills to be counted. She was reminded that bringing the medication bottles, even when empty, is a requirement.  Medication: None brought in. Pill/Patch Count: None available to be counted. Bottle Appearance: No container available. Did not bring bottle(s) to appointment. Filled Date: N/A Last Medication intake:  Yesterday

## 2022-05-14 NOTE — Progress Notes (Signed)
Subjective:  Patient ID: Carrie Myers, female    DOB: Sep 06, 1945  Age: 77 y.o. MRN: [id number]  CC: Neck Pain, Back Pain (lower), and Leg Pain   Procedure: None  HPI Carrie Myers presents for reevaluation.  She continues to have complaints of chronic ongoing neck pain low back pain and leg pain comparable to what she is experienced in the past.  The quality characteristic and distribution has been stable in nature without any changes.  She uses opioids sparingly for pain control where she has failed more conservative therapy with anti-inflammatories and physical therapy modalities alone.  Regards to her neck pain she feels significant discomfort upon flexion extension lateral rotation at the lateral side of the joint.  She is describing a pain that is worse with any type of upper arm activity especially things overhead or vacuuming.  The pain is a gnawing aching occasionally shooting pain.  It radiates into the left trapezius left shoulder region.  She does not experience any numbness or tingling or weakness in the upper extremities.  She is also taking Valium at bedtime to help with muscle spasms and sleep and this is working well for her.  She knows not to take concomitant use of both benzodiazepines and opioids as reviewed once again today and she avoids these.  No side effects with her medications are noted.  Outpatient Medications Prior to Visit  Medication Sig Dispense Refill   Armodafinil 150 MG tablet Take 150 mg by mouth daily. For fibromyalgia fatigue     aspirin EC 81 MG tablet Take 81 mg by mouth daily.     clobetasol ointment (TEMOVATE) 0.05 % Apply topically.     clotrimazole-betamethasone (LOTRISONE) cream Apply externally BID for 2 wks 45 g 0   Diclofenac-miSOPROStol 75-0.2 MG TBEC TAKE (1) TABLET BY MOUTH TWICE DAILY 60 tablet 5   DULoxetine (CYMBALTA) 30 MG capsule Take 3 capsules (90 mg total) by mouth daily. 270 capsule 0   fluticasone-salmeterol (ADVAIR HFA)  115-21 MCG/ACT inhaler Inhale into the lungs 2 (two) times daily.     levocetirizine (XYZAL) 5 MG tablet Take by mouth.     levothyroxine (SYNTHROID) 88 MCG tablet TAKE 1 TABLET BY MOUTH DAILY 90 tablet 0   Loperamide HCl (IMODIUM PO) Take 1 tablet by mouth as needed.     losartan (COZAAR) 100 MG tablet Take 100 mg by mouth daily.     Multiple Vitamin (MULTI-VITAMINS) TABS Take by mouth.     NON FORMULARY at bedtime. CPAP @ 11 cm H2O with Oxygen     pantoprazole (PROTONIX) 40 MG tablet Take 1 tablet by mouth 2 (two) times daily.     pravastatin (PRAVACHOL) 80 MG tablet Take 80 mg by mouth daily.     pregabalin (LYRICA) 50 MG capsule Take 50 mg by mouth 2 (two) times daily.     sucralfate (CARAFATE) 1 GM/10ML suspension Take 10 mLs (1 g total) by mouth 4 (four) times daily -  with meals and at bedtime. 420 mL 0   HYDROcodone-acetaminophen (NORCO/VICODIN) 5-325 MG tablet Take 1 tablet by mouth 2 (two) times daily as needed for moderate pain. 45 tablet 0   No facility-administered medications prior to visit.    Review of Systems CNS: No confusion or sedation Cardiac: No angina or palpitations GI: No abdominal pain or constipation Constitutional: No nausea vomiting fevers or chills  Objective:  BP (!) 164/88   Pulse 89   Temp (!) 96.9 F (36.1 C)  Resp 16   Ht '5\' 8"'$  (1.727 m)   Wt 176 lb (79.8 kg)   SpO2 100%   BMI 26.76 kg/m    BP Readings from Last 3 Encounters:  05/13/22 (!) 164/88  04/21/22 118/70  02/23/22 128/70     Wt Readings from Last 3 Encounters:  05/13/22 176 lb (79.8 kg)  04/21/22 181 lb (82.1 kg)  04/15/22 182 lb (82.6 kg)     Physical Exam Pt is alert and oriented PERRL EOMI HEART IS RRR no murmur or rub LCTA no wheezing or rales MUSCULOSKELETAL reveals some paraspinous muscle tenderness in the splenius capitis left side some radiation into the left mid body trapezius.  Muscle tone and bulk in the upper extremities is good she has good range of motion  at the glenohumeral joint.  Good grip strength bilaterally with no evidence of any sensorimotor deficits to the upper extremities.  She does have pain with motion at the atlantooccipital joint of the left and right lateral rotation and extension with left rotation worse than on the right side.  Labs  Lab Results  Component Value Date   HGBA1C 6.3 (H) 04/21/2022   HGBA1C 6.1 (H) 09/05/2020   HGBA1C 6.5 (H) 03/28/2020   Lab Results  Component Value Date   LDLCALC 64 04/21/2022   CREATININE 0.87 04/21/2022    -------------------------------------------------------------------------------------------------------------------- Lab Results  Component Value Date   WBC 5.3 04/21/2022   HGB 12.2 04/21/2022   HCT 37.7 04/21/2022   PLT 302 04/21/2022   GLUCOSE 94 04/21/2022   CHOL 140 04/21/2022   TRIG 120 04/21/2022   HDL 55 04/21/2022   LDLCALC 64 04/21/2022   ALT 26 04/21/2022   AST 28 04/21/2022   NA 140 04/21/2022   K 4.4 04/21/2022   CL 100 04/21/2022   CREATININE 0.87 04/21/2022   BUN 21 04/21/2022   CO2 24 04/21/2022   TSH 1.880 04/21/2022   HGBA1C 6.3 (H) 04/21/2022    --------------------------------------------------------------------------------------------------------------------- No results found.   Assessment & Plan:   Carrie Myers was seen today for neck pain, back pain and leg pain.  Diagnoses and all orders for this visit:  Chronic, continuous use of opioids  Chronic bilateral low back pain without sciatica  Fibromyalgia -     Discontinue: diazepam (VALIUM) 5 MG tablet; Take 1 tablet (5 mg total) by mouth at bedtime as needed for anxiety. -     Ambulatory referral to Physical Therapy -     Discontinue: diazepam (VALIUM) 5 MG tablet; Take 1 tablet (5 mg total) by mouth at bedtime as needed for anxiety. -     Discontinue: diazepam (VALIUM) 5 MG tablet; Take 1 tablet (5 mg total) by mouth at bedtime as needed for anxiety. -     Discontinue: diazepam (VALIUM) 5  MG tablet; Take 1 tablet (5 mg total) by mouth at bedtime as needed for anxiety. -     diazepam (VALIUM) 5 MG tablet; Take 1 tablet (5 mg total) by mouth at bedtime as needed for anxiety.  Facet syndrome, lumbar -     Discontinue: diazepam (VALIUM) 5 MG tablet; Take 1 tablet (5 mg total) by mouth at bedtime as needed for anxiety. -     Discontinue: diazepam (VALIUM) 5 MG tablet; Take 1 tablet (5 mg total) by mouth at bedtime as needed for anxiety. -     Discontinue: diazepam (VALIUM) 5 MG tablet; Take 1 tablet (5 mg total) by mouth at bedtime as needed for anxiety. -  Discontinue: diazepam (VALIUM) 5 MG tablet; Take 1 tablet (5 mg total) by mouth at bedtime as needed for anxiety. -     diazepam (VALIUM) 5 MG tablet; Take 1 tablet (5 mg total) by mouth at bedtime as needed for anxiety.  Chronic pain syndrome -     Discontinue: diazepam (VALIUM) 5 MG tablet; Take 1 tablet (5 mg total) by mouth at bedtime as needed for anxiety. -     Discontinue: diazepam (VALIUM) 5 MG tablet; Take 1 tablet (5 mg total) by mouth at bedtime as needed for anxiety. -     Discontinue: diazepam (VALIUM) 5 MG tablet; Take 1 tablet (5 mg total) by mouth at bedtime as needed for anxiety. -     Discontinue: diazepam (VALIUM) 5 MG tablet; Take 1 tablet (5 mg total) by mouth at bedtime as needed for anxiety. -     diazepam (VALIUM) 5 MG tablet; Take 1 tablet (5 mg total) by mouth at bedtime as needed for anxiety.  Spondylosis of lumbar region without myelopathy or radiculopathy  Chronic bilateral low back pain with bilateral sciatica  Cervicalgia -     Discontinue: diazepam (VALIUM) 5 MG tablet; Take 1 tablet (5 mg total) by mouth at bedtime as needed for anxiety. -     Ambulatory referral to Physical Therapy -     Discontinue: diazepam (VALIUM) 5 MG tablet; Take 1 tablet (5 mg total) by mouth at bedtime as needed for anxiety. -     Discontinue: diazepam (VALIUM) 5 MG tablet; Take 1 tablet (5 mg total) by mouth at  bedtime as needed for anxiety. -     Discontinue: diazepam (VALIUM) 5 MG tablet; Take 1 tablet (5 mg total) by mouth at bedtime as needed for anxiety. -     diazepam (VALIUM) 5 MG tablet; Take 1 tablet (5 mg total) by mouth at bedtime as needed for anxiety.  Cervical facet joint syndrome -     Ambulatory referral to Physical Therapy  Other orders -     Discontinue: HYDROcodone-acetaminophen (NORCO/VICODIN) 5-325 MG tablet; Take 1 tablet by mouth 2 (two) times daily. -     Discontinue: HYDROcodone-acetaminophen (NORCO/VICODIN) 5-325 MG tablet; Take 1 tablet by mouth 2 (two) times daily. -     Discontinue: HYDROcodone-acetaminophen (NORCO/VICODIN) 5-325 MG tablet; Take 1 tablet by mouth 2 (two) times daily. -     HYDROcodone-acetaminophen (NORCO/VICODIN) 5-325 MG tablet; Take 1 tablet by mouth 2 (two) times daily. -     Discontinue: HYDROcodone-acetaminophen (NORCO/VICODIN) 5-325 MG tablet; Take 1 tablet by mouth 2 (two) times daily. -     Discontinue: HYDROcodone-acetaminophen (NORCO/VICODIN) 5-325 MG tablet; Take 1 tablet by mouth 2 (two) times daily as needed for moderate pain. -     Discontinue: HYDROcodone-acetaminophen (NORCO/VICODIN) 5-325 MG tablet; Take 1 tablet by mouth 2 (two) times daily. -     HYDROcodone-acetaminophen (NORCO/VICODIN) 5-325 MG tablet; Take 1 tablet by mouth 2 (two) times daily. -     HYDROcodone-acetaminophen (NORCO/VICODIN) 5-325 MG tablet; Take 1 tablet by mouth 2 (two) times daily as needed for moderate pain.        ----------------------------------------------------------------------------------------------------------------------  Problem List Items Addressed This Visit       Unprioritized   Chronic pain syndrome (Chronic)   Relevant Medications   HYDROcodone-acetaminophen (NORCO/VICODIN) 5-325 MG tablet (Start on 06/13/2022)   HYDROcodone-acetaminophen (NORCO/VICODIN) 5-325 MG tablet (Start on 06/12/2022)   HYDROcodone-acetaminophen (NORCO/VICODIN)  5-325 MG tablet   diazepam (VALIUM) 5 MG tablet  Fibromyalgia (Chronic)   Relevant Medications   HYDROcodone-acetaminophen (NORCO/VICODIN) 5-325 MG tablet (Start on 06/13/2022)   HYDROcodone-acetaminophen (NORCO/VICODIN) 5-325 MG tablet (Start on 06/12/2022)   HYDROcodone-acetaminophen (NORCO/VICODIN) 5-325 MG tablet   diazepam (VALIUM) 5 MG tablet   Other Relevant Orders   Ambulatory referral to Physical Therapy   Facet syndrome, lumbar   Relevant Medications   HYDROcodone-acetaminophen (NORCO/VICODIN) 5-325 MG tablet (Start on 06/13/2022)   HYDROcodone-acetaminophen (NORCO/VICODIN) 5-325 MG tablet (Start on 06/12/2022)   HYDROcodone-acetaminophen (NORCO/VICODIN) 5-325 MG tablet   diazepam (VALIUM) 5 MG tablet   Other Visit Diagnoses     Chronic, continuous use of opioids    -  Primary   Chronic bilateral low back pain without sciatica       Relevant Medications   HYDROcodone-acetaminophen (NORCO/VICODIN) 5-325 MG tablet (Start on 06/13/2022)   HYDROcodone-acetaminophen (NORCO/VICODIN) 5-325 MG tablet (Start on 06/12/2022)   HYDROcodone-acetaminophen (NORCO/VICODIN) 5-325 MG tablet   Spondylosis of lumbar region without myelopathy or radiculopathy       Relevant Medications   HYDROcodone-acetaminophen (NORCO/VICODIN) 5-325 MG tablet (Start on 06/13/2022)   HYDROcodone-acetaminophen (NORCO/VICODIN) 5-325 MG tablet (Start on 06/12/2022)   HYDROcodone-acetaminophen (NORCO/VICODIN) 5-325 MG tablet   Chronic bilateral low back pain with bilateral sciatica       Relevant Medications   HYDROcodone-acetaminophen (NORCO/VICODIN) 5-325 MG tablet (Start on 06/13/2022)   HYDROcodone-acetaminophen (NORCO/VICODIN) 5-325 MG tablet (Start on 06/12/2022)   HYDROcodone-acetaminophen (NORCO/VICODIN) 5-325 MG tablet   diazepam (VALIUM) 5 MG tablet   Cervicalgia       Relevant Medications   diazepam (VALIUM) 5 MG tablet   Other Relevant Orders   Ambulatory referral to Physical Therapy   Cervical facet  joint syndrome       Relevant Orders   Ambulatory referral to Physical Therapy         ----------------------------------------------------------------------------------------------------------------------  1. Chronic, continuous use of opioids Continue current medication management with refills given today.  It was complicated getting her medications through secondary to the recent problems with E scribing but ultimately she was able to receive hardcopy medications to take to pharmacy.  I have reviewed the [facility] practitioner database information and it is appropriate.  She is also refilled on her Valium.  2. Chronic bilateral low back pain without sciatica As above  3. Fibromyalgia As above - Ambulatory referral to Physical Therapy - diazepam (VALIUM) 5 MG tablet; Take 1 tablet (5 mg total) by mouth at bedtime as needed for anxiety.  Dispense: 30 tablet; Refill: 5  4. Facet syndrome, lumbar  - diazepam (VALIUM) 5 MG tablet; Take 1 tablet (5 mg total) by mouth at bedtime as needed for anxiety.  Dispense: 30 tablet; Refill: 5  5. Chronic pain syndrome As above - diazepam (VALIUM) 5 MG tablet; Take 1 tablet (5 mg total) by mouth at bedtime as needed for anxiety.  Dispense: 30 tablet; Refill: 5  6. Spondylosis of lumbar region without myelopathy or radiculopathy Continue core stretching strengthening exercises  7. Chronic bilateral low back pain with bilateral sciatica   8. Cervicalgia I am referring her to to physical therapy for evaluation and treatment regarding her neck pain.  Ultimately she may be a candidate for a cervical epidural steroid injection if she has limited success with the physical therapy.  She may require a cervical MRI for further evaluation of her atlantooccipital and cervical anatomy.   - Ambulatory referral to Physical Therapy - diazepam (VALIUM) 5 MG tablet; Take 1 tablet (5 mg total) by  mouth at bedtime as needed for anxiety.  Dispense: 30  tablet; Refill: 5  9. Cervical facet joint syndrome  - Ambulatory referral to Physical Therapy    ----------------------------------------------------------------------------------------------------------------------  I am having Carrie Myers maintain her pravastatin, aspirin EC, Multi-Vitamins, Loperamide HCl (IMODIUM PO), pantoprazole, Armodafinil, pregabalin, levocetirizine, NON FORMULARY, Advair HFA, losartan, clotrimazole-betamethasone, Diclofenac-miSOPROStol, levothyroxine, sucralfate, DULoxetine, clobetasol ointment, HYDROcodone-acetaminophen, HYDROcodone-acetaminophen, HYDROcodone-acetaminophen, and diazepam.   Meds ordered this encounter  Medications   DISCONTD: HYDROcodone-acetaminophen (NORCO/VICODIN) 5-325 MG tablet    Sig: Take 1 tablet by mouth 2 (two) times daily.    Dispense:  45 tablet    Refill:  0   DISCONTD: HYDROcodone-acetaminophen (NORCO/VICODIN) 5-325 MG tablet    Sig: Take 1 tablet by mouth 2 (two) times daily.    Dispense:  45 tablet    Refill:  0   DISCONTD: diazepam (VALIUM) 5 MG tablet    Sig: Take 1 tablet (5 mg total) by mouth at bedtime as needed for anxiety.    Dispense:  30 tablet    Refill:  5   DISCONTD: HYDROcodone-acetaminophen (NORCO/VICODIN) 5-325 MG tablet    Sig: Take 1 tablet by mouth 2 (two) times daily.    Dispense:  45 tablet    Refill:  0   HYDROcodone-acetaminophen (NORCO/VICODIN) 5-325 MG tablet    Sig: Take 1 tablet by mouth 2 (two) times daily.    Dispense:  45 tablet    Refill:  0   DISCONTD: HYDROcodone-acetaminophen (NORCO/VICODIN) 5-325 MG tablet    Sig: Take 1 tablet by mouth 2 (two) times daily.    Dispense:  45 tablet    Refill:  0   DISCONTD: diazepam (VALIUM) 5 MG tablet    Sig: Take 1 tablet (5 mg total) by mouth at bedtime as needed for anxiety.    Dispense:  30 tablet    Refill:  5   DISCONTD: diazepam (VALIUM) 5 MG tablet    Sig: Take 1 tablet (5 mg total) by mouth at bedtime as needed for anxiety.     Dispense:  30 tablet    Refill:  5   DISCONTD: diazepam (VALIUM) 5 MG tablet    Sig: Take 1 tablet (5 mg total) by mouth at bedtime as needed for anxiety.    Dispense:  30 tablet    Refill:  5   DISCONTD: HYDROcodone-acetaminophen (NORCO/VICODIN) 5-325 MG tablet    Sig: Take 1 tablet by mouth 2 (two) times daily as needed for moderate pain.    Dispense:  45 tablet    Refill:  0    To be filled once per month..30 day supply   DISCONTD: HYDROcodone-acetaminophen (NORCO/VICODIN) 5-325 MG tablet    Sig: Take 1 tablet by mouth 2 (two) times daily.    Dispense:  45 tablet    Refill:  0   HYDROcodone-acetaminophen (NORCO/VICODIN) 5-325 MG tablet    Sig: Take 1 tablet by mouth 2 (two) times daily.    Dispense:  45 tablet    Refill:  0   HYDROcodone-acetaminophen (NORCO/VICODIN) 5-325 MG tablet    Sig: Take 1 tablet by mouth 2 (two) times daily as needed for moderate pain.    Dispense:  45 tablet    Refill:  0    To be filled once per month..30 day supply   diazepam (VALIUM) 5 MG tablet    Sig: Take 1 tablet (5 mg total) by mouth at bedtime as needed for anxiety.    Dispense:  30  tablet    Refill:  5   Patient's Medications  New Prescriptions   DIAZEPAM (VALIUM) 5 MG TABLET    Take 1 tablet (5 mg total) by mouth at bedtime as needed for anxiety.   HYDROCODONE-ACETAMINOPHEN (NORCO/VICODIN) 5-325 MG TABLET    Take 1 tablet by mouth 2 (two) times daily.   HYDROCODONE-ACETAMINOPHEN (NORCO/VICODIN) 5-325 MG TABLET    Take 1 tablet by mouth 2 (two) times daily.  Previous Medications   ARMODAFINIL 150 MG TABLET    Take 150 mg by mouth daily. For fibromyalgia fatigue   ASPIRIN EC 81 MG TABLET    Take 81 mg by mouth daily.   CLOBETASOL OINTMENT (TEMOVATE) 0.05 %    Apply topically.   CLOTRIMAZOLE-BETAMETHASONE (LOTRISONE) CREAM    Apply externally BID for 2 wks   DICLOFENAC-MISOPROSTOL 75-0.2 MG TBEC    TAKE (1) TABLET BY MOUTH TWICE DAILY   DULOXETINE (CYMBALTA) 30 MG CAPSULE    Take 3  capsules (90 mg total) by mouth daily.   FLUTICASONE-SALMETEROL (ADVAIR HFA) 115-21 MCG/ACT INHALER    Inhale into the lungs 2 (two) times daily.   LEVOCETIRIZINE (XYZAL) 5 MG TABLET    Take by mouth.   LEVOTHYROXINE (SYNTHROID) 88 MCG TABLET    TAKE 1 TABLET BY MOUTH DAILY   LOPERAMIDE HCL (IMODIUM PO)    Take 1 tablet by mouth as needed.   LOSARTAN (COZAAR) 100 MG TABLET    Take 100 mg by mouth daily.   MULTIPLE VITAMIN (MULTI-VITAMINS) TABS    Take by mouth.   NON FORMULARY    at bedtime. CPAP @ 11 cm H2O with Oxygen   PANTOPRAZOLE (PROTONIX) 40 MG TABLET    Take 1 tablet by mouth 2 (two) times daily.   PRAVASTATIN (PRAVACHOL) 80 MG TABLET    Take 80 mg by mouth daily.   PREGABALIN (LYRICA) 50 MG CAPSULE    Take 50 mg by mouth 2 (two) times daily.   SUCRALFATE (CARAFATE) 1 GM/10ML SUSPENSION    Take 10 mLs (1 g total) by mouth 4 (four) times daily -  with meals and at bedtime.  Modified Medications   Modified Medication Previous Medication   HYDROCODONE-ACETAMINOPHEN (NORCO/VICODIN) 5-325 MG TABLET HYDROcodone-acetaminophen (NORCO/VICODIN) 5-325 MG tablet      Take 1 tablet by mouth 2 (two) times daily as needed for moderate pain.    Take 1 tablet by mouth 2 (two) times daily as needed for moderate pain.  Discontinued Medications   No medications on file   ----------------------------------------------------------------------------------------------------------------------  Follow-up: Return in about 2 months (around 07/12/2022) for evaluation, med refill.    Molli Barrows, MD

## 2022-05-21 ENCOUNTER — Encounter: Payer: Self-pay | Admitting: Internal Medicine

## 2022-05-21 ENCOUNTER — Ambulatory Visit (INDEPENDENT_AMBULATORY_CARE_PROVIDER_SITE_OTHER): Payer: Medicare Other | Admitting: Internal Medicine

## 2022-05-21 VITALS — BP 128/78 | HR 84 | Ht 68.0 in | Wt 177.0 lb

## 2022-05-21 DIAGNOSIS — K219 Gastro-esophageal reflux disease without esophagitis: Secondary | ICD-10-CM

## 2022-05-21 DIAGNOSIS — F331 Major depressive disorder, recurrent, moderate: Secondary | ICD-10-CM | POA: Diagnosis not present

## 2022-05-21 NOTE — Assessment & Plan Note (Addendum)
Cymbalta increased to 90 mg last visit Symptoms much improved on current regimen with good control of symptoms, No SI or HI. Continue current regimen

## 2022-05-21 NOTE — Assessment & Plan Note (Signed)
Symptoms well controlled on daily PPI No red flag signs such as weight loss, n/v, melena Still has the cough - seen by GI and considering EGD

## 2022-05-21 NOTE — Progress Notes (Signed)
Date:  05/21/2022   Name:  Carrie Myers   DOB:  [birth year]   MRN:  [id number]   Chief Complaint: Depression  Depression        This is a chronic problem.  Episode frequency: much improved with cymbalta dose increase.  The problem has been waxing and waning since onset.  Associated symptoms include no appetite change and no headaches.     The symptoms are aggravated by family issues and work stress.  Past treatments include SNRIs - Serotonin and norepinephrine reuptake inhibitors.  Compliance with treatment is good.  Previous treatment provided significant relief. Gastroesophageal Reflux She complains of coughing and heartburn. She reports no abdominal pain, no chest pain or no wheezing. This is a chronic problem. The problem occurs frequently. The problem has been gradually improving (since adding Carafate). She has tried a PPI for the symptoms. The treatment provided moderate relief. EGD planned.    Lab Results  Component Value Date   NA 140 04/21/2022   K 4.4 04/21/2022   CO2 24 04/21/2022   GLUCOSE 94 04/21/2022   BUN 21 04/21/2022   CREATININE 0.87 04/21/2022   CALCIUM 9.2 04/21/2022   EGFR 70 04/21/2022   GFRNONAA 57 (L) 03/28/2020   Lab Results  Component Value Date   CHOL 140 04/21/2022   HDL 55 04/21/2022   LDLCALC 64 04/21/2022   TRIG 120 04/21/2022   CHOLHDL 2.5 04/21/2022   Lab Results  Component Value Date   TSH 1.880 04/21/2022   Lab Results  Component Value Date   HGBA1C 6.3 (H) 04/21/2022   Lab Results  Component Value Date   WBC 5.3 04/21/2022   HGB 12.2 04/21/2022   HCT 37.7 04/21/2022   MCV 89 04/21/2022   PLT 302 04/21/2022   Lab Results  Component Value Date   ALT 26 04/21/2022   AST 28 04/21/2022   ALKPHOS 82 04/21/2022   BILITOT 0.3 04/21/2022   No results found for: "25OHVITD2", "25OHVITD3", "VD25OH"   Review of Systems  Constitutional:  Negative for appetite change, fever and unexpected weight change.  HENT:  Negative for  trouble swallowing.   Respiratory:  Positive for cough. Negative for chest tightness and wheezing.   Cardiovascular:  Negative for chest pain, palpitations and leg swelling.  Gastrointestinal:  Positive for heartburn. Negative for abdominal pain and blood in stool.  Musculoskeletal:  Positive for back pain.  Neurological:  Negative for dizziness and headaches.  Psychiatric/Behavioral:  Positive for depression. Negative for dysphoric mood and sleep disturbance. The patient is not nervous/anxious.     Patient Active Problem List   Diagnosis Date Noted   Arthralgia of left ankle 10/20/2021   Osteopenia determined by x-ray 03/31/2021   Primary osteoarthritis of left knee 01/22/2021   Effusion, right knee 12/15/2020   Primary osteoarthritis of right knee 11/24/2020   Overactive bladder 06/18/2020   Prediabetes 04/14/2020   Slow transit constipation 06/05/2019   Sciatica, left side 01/01/2019   Diverticulosis of colon 04/25/2018   Change in bowel habits    Tobacco use disorder, moderate, in sustained remission 12/22/2017   Moderate episode of recurrent major depressive disorder ([place]) 10/03/2017   Inflammatory spondylopathy of lumbar region ([place]) 10/03/2017   Hoarseness, persistent 04/18/2017   OSA on CPAP 02/18/2017   Cervical disc disease 12/31/2016   Generalized osteoarthritis 12/23/2016   Chronic pain syndrome 12/23/2016   Cystocele, midline 12/06/2016   Urinary incontinence 12/06/2016   Impingement syndrome of right shoulder 04/17/2015  Fibrocystic breast 01/18/2015   Gastroesophageal reflux disease without esophagitis 01/18/2015   Hypothyroidism, postablative 01/18/2015   Arteriosclerosis of coronary artery 12/29/2014   DJD of shoulder 11/13/2014   Fibromyalgia 10/02/2014   Intercostal neuralgia 10/02/2014   Facet syndrome, lumbar 10/02/2014   Sacroiliac joint disease 10/02/2014   Carotid artery narrowing 07/09/2014   Benign essential HTN 07/01/2014   Osteoarthritis of  thumb 09/24/2012   Mixed hyperlipidemia 09/22/2012   Arthritis of hand, degenerative 09/22/2012    Allergies  Allergen Reactions   Amoxicillin-Pot Clavulanate Nausea And Vomiting and Other (See Comments)   Codeine Nausea And Vomiting and Other (See Comments)   Sulfa Antibiotics Nausea And Vomiting   Proparacaine Itching   Shellfish Allergy Nausea And Vomiting    No issues with iodine     Past Surgical History:  Procedure Laterality Date   ABDOMINAL HYSTERECTOMY     total   APPENDECTOMY     BREAST BIOPSY Right    neg   BREAST CYST ASPIRATION Left    neg   BREAST EXCISIONAL BIOPSY Right    BUNIONECTOMY Bilateral    CESAREAN SECTION     COLONOSCOPY  2010   normal   COLONOSCOPY WITH PROPOFOL N/A 04/03/2018   Procedure: COLONOSCOPY WITH BIOPSIES;  Surgeon: Lucilla Lame, MD;  Location: [facility];  Service: Endoscopy;  Laterality: N/A;  sleep apnea   CORONARY ANGIOPLASTY WITH STENT PLACEMENT  04/2006   dental implant     [age]   DILATION AND CURETTAGE OF UTERUS     epidural steroid injection  2018   ESOPHAGOGASTRODUODENOSCOPY N/A 10/03/2020   Procedure: ESOPHAGOGASTRODUODENOSCOPY (EGD);  Surgeon: Lesly Rubenstein, MD;  Location: [facility] ENDOSCOPY;  Service: Endoscopy;  Laterality: N/A;  REQUESTING MORNING AFTER 9 AM   EYE SURGERY     bilateral cataract extraction   SHOULDER ARTHROSCOPY Right 05/07/2015   Procedure: Extensive arthroscopic debridement and arthroscopic subacromial decompression, right shoulder.                        2.  Steroid injection right thumb CMC joint.;  Surgeon: Corky Mull, MD;  Location: [facility];  Service: Orthopedics;  Laterality: Right;   TOOTH EXTRACTION     Upper centrals and lateral    Social History   Tobacco Use   Smoking status: Former    Packs/day: 1.25    Years: 44.00    Total pack years: 55.00    Types: Cigarettes    Quit date: 04/22/2006    Years since quitting: 16.0   Smokeless tobacco: Never   Tobacco  comments:       Vaping Use   Vaping Use: Never used  Substance Use Topics   Alcohol use: Not Currently   Drug use: Never     Medication list has been reviewed and updated.  Current Meds  Medication Sig   aspirin EC 81 MG tablet Take 81 mg by mouth daily.   clobetasol ointment (TEMOVATE) 0.05 % Apply topically.   clotrimazole-betamethasone (LOTRISONE) cream Apply externally BID for 2 wks   diazepam (VALIUM) 5 MG tablet Take 1 tablet (5 mg total) by mouth at bedtime as needed for anxiety.   Diclofenac-miSOPROStol 75-0.2 MG TBEC TAKE (1) TABLET BY MOUTH TWICE DAILY   DULoxetine (CYMBALTA) 30 MG capsule Take 3 capsules (90 mg total) by mouth daily.   fluticasone-salmeterol (ADVAIR HFA) 115-21 MCG/ACT inhaler Inhale into the lungs 2 (two) times daily.   [START ON 06/13/2022]  HYDROcodone-acetaminophen (NORCO/VICODIN) 5-325 MG tablet Take 1 tablet by mouth 2 (two) times daily.   [START ON 06/12/2022] HYDROcodone-acetaminophen (NORCO/VICODIN) 5-325 MG tablet Take 1 tablet by mouth 2 (two) times daily.   HYDROcodone-acetaminophen (NORCO/VICODIN) 5-325 MG tablet Take 1 tablet by mouth 2 (two) times daily as needed for moderate pain.   levocetirizine (XYZAL) 5 MG tablet Take by mouth.   levothyroxine (SYNTHROID) 88 MCG tablet TAKE 1 TABLET BY MOUTH DAILY   Loperamide HCl (IMODIUM PO) Take 1 tablet by mouth as needed.   losartan (COZAAR) 100 MG tablet Take 100 mg by mouth daily.   Multiple Vitamin (MULTI-VITAMINS) TABS Take by mouth.   NON FORMULARY at bedtime. CPAP @ 11 cm H2O with Oxygen   pantoprazole (PROTONIX) 40 MG tablet Take 1 tablet by mouth 2 (two) times daily.   pravastatin (PRAVACHOL) 80 MG tablet Take 80 mg by mouth daily.   pregabalin (LYRICA) 50 MG capsule Take 50 mg by mouth 2 (two) times daily.   sucralfate (CARAFATE) 1 GM/10ML suspension Take 10 mLs (1 g total) by mouth 4 (four) times daily -  with meals and at bedtime.       05/21/2022    3:42 PM 04/21/2022   10:59 AM  02/23/2022    1:59 PM 10/20/2021    3:44 PM  GAD 7 : Generalized Anxiety Score  Nervous, Anxious, on Edge 0 3 0 0  Control/stop worrying 0 3 0 0  Worry too much - different things 0 3 0 0  Trouble relaxing 0 0 0 0  Restless 0 0 0 0  Easily annoyed or irritable 1 2 0 0  Afraid - awful might happen 0 0 0 0  Total GAD 7 Score 1 11 0 0  Anxiety Difficulty Not difficult at all Very difficult Not difficult at all Not difficult at all       05/21/2022    3:42 PM 05/13/2022    1:50 PM 04/21/2022   10:58 AM  Depression screen PHQ 2/9  Decreased Interest 0 0 2  Down, Depressed, Hopeless 0 0 1  PHQ - 2 Score 0 0 3  Altered sleeping 0  0  Tired, decreased energy 1  3  Change in appetite 0  3  Feeling bad or failure about yourself  0  0  Trouble concentrating 0  3  Moving slowly or fidgety/restless 0  0  Suicidal thoughts 0  0  PHQ-9 Score 1  12  Difficult doing work/chores Not difficult at all  Not difficult at all    BP Readings from Last 3 Encounters:  05/21/22 128/78  05/13/22 (!) 164/88  04/21/22 118/70    Physical Exam Vitals and nursing note reviewed.  Constitutional:      General: She is not in acute distress.    Appearance: Normal appearance. She is well-developed.  HENT:     Head: Normocephalic and atraumatic.  Cardiovascular:     Rate and Rhythm: Normal rate and regular rhythm.  Pulmonary:     Effort: Pulmonary effort is normal. No respiratory distress.     Breath sounds: No wheezing or rhonchi.  Musculoskeletal:     Cervical back: Normal range of motion.     Right lower leg: No edema.     Left lower leg: No edema.  Lymphadenopathy:     Cervical: No cervical adenopathy.  Skin:    General: Skin is warm and dry.     Findings: No rash.  Neurological:  Mental Status: She is alert and oriented to person, place, and time.  Psychiatric:        Mood and Affect: Mood normal.        Behavior: Behavior normal.     Wt Readings from Last 3 Encounters:  05/21/22 177  lb (80.3 kg)  05/13/22 176 lb (79.8 kg)  04/21/22 181 lb (82.1 kg)    BP 128/78   Pulse 84   Ht '5\' 8"'$  (1.727 m)   Wt 177 lb (80.3 kg)   SpO2 94%   BMI 26.91 kg/m   Assessment and Plan: Problem List Items Addressed This Visit       Digestive   Gastroesophageal reflux disease without esophagitis - Primary (Chronic)    Symptoms well controlled on daily PPI No red flag signs such as weight loss, n/v, melena Still has the cough - seen by GI and considering EGD        Other   Moderate episode of recurrent major depressive disorder (HCC) (Chronic)    Cymbalta increased to 90 mg last visit Symptoms much improved on current regimen with good control of symptoms, No SI or HI. Continue current regimen         Partially dictated using Editor, commissioning. Any errors are unintentional.  Halina Maidens, MD [facility]  05/21/2022

## 2022-06-02 ENCOUNTER — Other Ambulatory Visit: Payer: Self-pay | Admitting: Internal Medicine

## 2022-06-03 ENCOUNTER — Ambulatory Visit: Payer: Medicare Other | Admitting: Physical Therapy

## 2022-06-03 NOTE — Telephone Encounter (Signed)
Requested Prescriptions  Pending Prescriptions Disp Refills   levothyroxine (SYNTHROID) 88 MCG tablet [Pharmacy Med Name: Levothyroxine Sodium 88 MCG Oral Tablet] 90 tablet 2    Sig: TAKE 1 TABLET BY MOUTH DAILY     Endocrinology:  Hypothyroid Agents Passed - 06/02/2022 10:15 PM      Passed - TSH in normal range and within 360 days    TSH  Date Value Ref Range Status  04/21/2022 1.880 0.450 - 4.500 uIU/mL Final         Passed - Valid encounter within last 12 months    Recent Outpatient Visits           1 week ago Gastroesophageal reflux disease without esophagitis   [facility] at [facility], Jesse Sans, MD   1 month ago Benign essential HTN    [facility] at [facility], Jesse Sans, MD   3 months ago Influenza A   [facility] at [facility], Jesse Sans, MD   7 months ago Arthralgia of left ankle   [facility] at [place], Earley Abide, MD   1 year ago Benign essential HTN   [place] at [facility], Jesse Sans, MD       Future Appointments             In 2 months Army Melia, Jesse Sans, MD [place] at [facility], [facility]

## 2022-06-07 ENCOUNTER — Encounter: Payer: Self-pay | Admitting: Anesthesiology

## 2022-06-07 ENCOUNTER — Ambulatory Visit: Payer: Medicare Other | Attending: Anesthesiology | Admitting: Anesthesiology

## 2022-06-07 DIAGNOSIS — M47816 Spondylosis without myelopathy or radiculopathy, lumbar region: Secondary | ICD-10-CM

## 2022-06-07 DIAGNOSIS — M542 Cervicalgia: Secondary | ICD-10-CM

## 2022-06-07 DIAGNOSIS — M47812 Spondylosis without myelopathy or radiculopathy, cervical region: Secondary | ICD-10-CM

## 2022-06-07 DIAGNOSIS — M5432 Sciatica, left side: Secondary | ICD-10-CM

## 2022-06-07 DIAGNOSIS — M5442 Lumbago with sciatica, left side: Secondary | ICD-10-CM | POA: Diagnosis not present

## 2022-06-07 DIAGNOSIS — F1190 Opioid use, unspecified, uncomplicated: Secondary | ICD-10-CM | POA: Diagnosis not present

## 2022-06-07 DIAGNOSIS — M797 Fibromyalgia: Secondary | ICD-10-CM | POA: Diagnosis not present

## 2022-06-07 DIAGNOSIS — M5441 Lumbago with sciatica, right side: Secondary | ICD-10-CM | POA: Diagnosis not present

## 2022-06-07 DIAGNOSIS — M25512 Pain in left shoulder: Secondary | ICD-10-CM

## 2022-06-07 DIAGNOSIS — M5136 Other intervertebral disc degeneration, lumbar region: Secondary | ICD-10-CM

## 2022-06-07 DIAGNOSIS — G894 Chronic pain syndrome: Secondary | ICD-10-CM

## 2022-06-07 DIAGNOSIS — M545 Low back pain, unspecified: Secondary | ICD-10-CM

## 2022-06-07 DIAGNOSIS — M5412 Radiculopathy, cervical region: Secondary | ICD-10-CM

## 2022-06-07 DIAGNOSIS — G8929 Other chronic pain: Secondary | ICD-10-CM

## 2022-06-07 DIAGNOSIS — M25511 Pain in right shoulder: Secondary | ICD-10-CM

## 2022-06-07 MED ORDER — HYDROCODONE-ACETAMINOPHEN 5-325 MG PO TABS: 1.0000 | ORAL_TABLET | Freq: Two times a day (BID) | ORAL | 0 refills | Status: DC | PRN

## 2022-06-07 NOTE — Progress Notes (Signed)
Virtual Visit via Telephone Note  I connected with Carrie Myers on 06/07/22 at  3:40 PM EDT by telephone and verified that I am speaking with the correct person using two identifiers.  Location: Patient: Home Provider: Pain control center   I discussed the limitations, risks, security and privacy concerns of performing an evaluation and management service by telephone and the availability of in person appointments. I also discussed with the patient that there may be a patient responsible charge related to this service. The patient expressed understanding and agreed to proceed.   History of Present Illness: I spoke with Carrie Myers via telephone as we are unable lengthen the video portion conference.  She reports that unfortunately she is having quite a bit of cervical pain and some recurrent lumbar pain.  In regards to the cervical pain she is having a lot of centralized cervical pain worse with sleeping and bothering her throughout the day.  She considers it to be unbearable and aching gnawing in nature.  It radiates into both shoulders and she cannot discern whether she is getting pain into the arms or not but the pain has been severe.  She has had this pain in the past but not this severe.  It has been present for many years but the intensity as of recent has been severe for at least 6 to 8 weeks.  She is not dropping things and does not report weakness to the upper extremities but just discomfort that is not controlled with her current pain medications.  Additionally in regards to her low back pain she is experiencing some left leg sciatica that has been unremitting and despite physical therapy maneuvers this pain also is persistent.  It is present when she is up walking in the morning and bothers her at night in bed.  In the past she reports having had epidurals of the knocked out the mainstay of the sciatica symptom and have helped with the persistent low back pain she is currently  experiencing.  The low back has been unremitting as well the point where she cannot sleep or function or be active.  No change in bowel or bladder function is noted at this time but she does report that she is also had some flank pain and some left perineal pain in the area of her ovaries based on her report today.  She reports that she has had a hysterectomy with BSO.  She is wondering if this could be from her back as well.  Review of systems: General: No fevers or chills Pulmonary: No shortness of breath or dyspnea Cardiac: No angina or palpitations or lightheadedness GI: No abdominal pain or constipation Psych: No depression    Observations/Objective:  Current Outpatient Medications:    Armodafinil 150 MG tablet, Take 150 mg by mouth daily. For fibromyalgia fatigue, Disp: , Rfl:    aspirin EC 81 MG tablet, Take 81 mg by mouth daily., Disp: , Rfl:    clobetasol ointment (TEMOVATE) 0.05 %, Apply topically., Disp: , Rfl:    clotrimazole-betamethasone (LOTRISONE) cream, Apply externally BID for 2 wks, Disp: 45 g, Rfl: 0   diazepam (VALIUM) 5 MG tablet, Take 1 tablet (5 mg total) by mouth at bedtime as needed for anxiety., Disp: 30 tablet, Rfl: 5   Diclofenac-miSOPROStol 75-0.2 MG TBEC, TAKE (1) TABLET BY MOUTH TWICE DAILY, Disp: 60 tablet, Rfl: 5   DULoxetine (CYMBALTA) 30 MG capsule, Take 3 capsules (90 mg total) by mouth daily., Disp: 270 capsule, Rfl:  0   fluticasone-salmeterol (ADVAIR HFA) 115-21 MCG/ACT inhaler, Inhale into the lungs 2 (two) times daily., Disp: , Rfl:    [START ON 06/13/2022] HYDROcodone-acetaminophen (NORCO/VICODIN) 5-325 MG tablet, Take 1 tablet by mouth 2 (two) times daily., Disp: 45 tablet, Rfl: 0   [START ON 06/12/2022] HYDROcodone-acetaminophen (NORCO/VICODIN) 5-325 MG tablet, Take 1 tablet by mouth 2 (two) times daily., Disp: 45 tablet, Rfl: 0   [START ON 07/12/2022] HYDROcodone-acetaminophen (NORCO/VICODIN) 5-325 MG tablet, Take 1 tablet by mouth 2 (two) times daily  as needed for moderate pain., Disp: 45 tablet, Rfl: 0   levocetirizine (XYZAL) 5 MG tablet, Take by mouth., Disp: , Rfl:    levothyroxine (SYNTHROID) 88 MCG tablet, TAKE 1 TABLET BY MOUTH DAILY, Disp: 90 tablet, Rfl: 2   Loperamide HCl (IMODIUM PO), Take 1 tablet by mouth as needed., Disp: , Rfl:    losartan (COZAAR) 100 MG tablet, Take 100 mg by mouth daily., Disp: , Rfl:    Multiple Vitamin (MULTI-VITAMINS) TABS, Take by mouth., Disp: , Rfl:    NON FORMULARY, at bedtime. CPAP @ 11 cm H2O with Oxygen, Disp: , Rfl:    pantoprazole (PROTONIX) 40 MG tablet, Take 1 tablet by mouth 2 (two) times daily., Disp: , Rfl:    pravastatin (PRAVACHOL) 80 MG tablet, Take 80 mg by mouth daily., Disp: , Rfl:    pregabalin (LYRICA) 50 MG capsule, Take 50 mg by mouth 2 (two) times daily., Disp: , Rfl:    sucralfate (CARAFATE) 1 GM/10ML suspension, Take 10 mLs (1 g total) by mouth 4 (four) times daily -  with meals and at bedtime., Disp: 420 mL, Rfl: 0   Past Medical History:  Diagnosis Date   Allergy    Anxiety    Arthritis    neck, hands, lower back   Bilateral dry eyes    Chronic low back pain 12/23/2016   Coronary artery disease    DDD (degenerative disc disease), lumbar 10/02/2014   Dental crowns present    Implants, front "flipper" while waiting for other implants   Emphysema of lung ([place])    Fibromyalgia    Fibromyalgia    Fracture of right foot 01/14/2015   GERD (gastroesophageal reflux disease)    History of Graves' disease    Hyperlipidemia    Hypertension    Hypothyroidism    Myocardial infarction ([place]) 04/22/2006   Osteoarthritis    Other shoulder lesions, right shoulder 04/17/2015   Rheumatoid arthritis ([place])    Sciatica, left side 01/01/2019   Sleep apnea    CPAP   Status post shoulder surgery 05/27/2015   Thyroid disease    Vertigo    none for approx 9 yrs     Assessment and Plan: 1. Chronic, continuous use of opioids   2. Chronic bilateral low back pain without sciatica    3. Fibromyalgia   4. Facet syndrome, lumbar   5. Chronic pain syndrome   6. Spondylosis of lumbar region without myelopathy or radiculopathy   7. Chronic bilateral low back pain with bilateral sciatica   8. Cervicalgia   9. Cervical facet joint syndrome   10. DDD (degenerative disc disease), lumbar   11. Sciatica, left side   12. Pain of both shoulder joints   13. Cervical radiculitis   Based on her discussion today I think is appropriate to refill her medicines for the next 2 months.  She presently has a hydrocodone prescription for March 30 and I have renewed for April 29.  She  gets good relief from the medications and no side effects are reported.  She generally averages about 1 to 2 tablets/day.  Furthermore she takes Valium occasionally at night to help with sleep and muscle spasms in the neck and low back.  In regards to her neck pain this pain has been worse and has recently been of a crescendo pattern and I think it is worthy of a cervical MRI and this has been requested.  I do not see any other cervical MRI for consideration.  Lastly I am going to schedule her for a lumbar epidural steroid injection as the sciatica has responded to this in the past and think this would be indicated.  I want her to continue with gradual stretching strengthening exercises with scheduled epidural in 2 weeks.  She understands the risks and benefit profile of the injection.  Continue follow-up with her primary care physicians in regard to the flank pain and perineal pain as I feel that this is where the of their consideration to rule out other organic pathology in addition to potential for referred pain from the back.  Follow Up Instructions:    I discussed the assessment and treatment plan with the patient. The patient was provided an opportunity to ask questions and all were answered. The patient agreed with the plan and demonstrated an understanding of the instructions.   The patient was advised to call  back or seek an in-person evaluation if the symptoms worsen or if the condition fails to improve as anticipated.  I provided 30 minutes of non-face-to-face time during this encounter.   Molli Barrows, MD

## 2022-06-08 ENCOUNTER — Telehealth: Payer: Self-pay | Admitting: Internal Medicine

## 2022-06-08 ENCOUNTER — Ambulatory Visit: Payer: Medicare Other | Admitting: Physical Therapy

## 2022-06-08 NOTE — Telephone Encounter (Signed)
[place] to schedule their annual wellness visit. Appointment made for 07/08/2022.  Sherol Dade; Care Guide Ambulatory Clinical [facility] Direct Dial: [phone]

## 2022-06-10 ENCOUNTER — Encounter: Payer: Medicare Other | Admitting: Physical Therapy

## 2022-06-13 ENCOUNTER — Ambulatory Visit
Admission: RE | Admit: 2022-06-13 | Discharge: 2022-06-13 | Disposition: A | Discharge status: Home / Self Care | Payer: Medicare Other | Source: Ambulatory Visit | Attending: Anesthesiology | Admitting: Anesthesiology

## 2022-06-13 DIAGNOSIS — M25511 Pain in right shoulder: Secondary | ICD-10-CM | POA: Diagnosis not present

## 2022-06-13 DIAGNOSIS — M5412 Radiculopathy, cervical region: Secondary | ICD-10-CM

## 2022-06-13 DIAGNOSIS — M4312 Spondylolisthesis, cervical region: Secondary | ICD-10-CM | POA: Diagnosis not present

## 2022-06-13 DIAGNOSIS — M542 Cervicalgia: Secondary | ICD-10-CM

## 2022-06-13 DIAGNOSIS — M25512 Pain in left shoulder: Secondary | ICD-10-CM | POA: Insufficient documentation

## 2022-06-13 DIAGNOSIS — M47812 Spondylosis without myelopathy or radiculopathy, cervical region: Secondary | ICD-10-CM | POA: Diagnosis not present

## 2022-06-15 ENCOUNTER — Encounter: Payer: Medicare Other | Admitting: Physical Therapy

## 2022-06-17 ENCOUNTER — Encounter: Payer: Medicare Other | Admitting: Physical Therapy

## 2022-06-21 ENCOUNTER — Ambulatory Visit
Admission: RE | Admit: 2022-06-21 | Discharge: 2022-06-21 | Disposition: A | Discharge status: Home / Self Care | Payer: Medicare Other | Source: Ambulatory Visit | Attending: Anesthesiology | Admitting: Anesthesiology

## 2022-06-21 ENCOUNTER — Other Ambulatory Visit: Payer: Self-pay | Admitting: Anesthesiology

## 2022-06-21 ENCOUNTER — Encounter: Payer: Self-pay | Admitting: Anesthesiology

## 2022-06-21 ENCOUNTER — Ambulatory Visit (HOSPITAL_BASED_OUTPATIENT_CLINIC_OR_DEPARTMENT_OTHER): Payer: Medicare Other | Admitting: Anesthesiology

## 2022-06-21 VITALS — BP 125/60 | HR 84 | Temp 97.7°F | Resp 16 | Ht 68.0 in | Wt 180.0 lb

## 2022-06-21 DIAGNOSIS — M797 Fibromyalgia: Secondary | ICD-10-CM

## 2022-06-21 DIAGNOSIS — F1190 Opioid use, unspecified, uncomplicated: Secondary | ICD-10-CM | POA: Diagnosis present

## 2022-06-21 DIAGNOSIS — M25512 Pain in left shoulder: Secondary | ICD-10-CM | POA: Insufficient documentation

## 2022-06-21 DIAGNOSIS — M5136 Other intervertebral disc degeneration, lumbar region: Secondary | ICD-10-CM | POA: Insufficient documentation

## 2022-06-21 DIAGNOSIS — G894 Chronic pain syndrome: Secondary | ICD-10-CM

## 2022-06-21 DIAGNOSIS — M25511 Pain in right shoulder: Secondary | ICD-10-CM | POA: Insufficient documentation

## 2022-06-21 DIAGNOSIS — M545 Low back pain, unspecified: Secondary | ICD-10-CM | POA: Diagnosis present

## 2022-06-21 DIAGNOSIS — M5412 Radiculopathy, cervical region: Secondary | ICD-10-CM | POA: Diagnosis not present

## 2022-06-21 DIAGNOSIS — M5432 Sciatica, left side: Secondary | ICD-10-CM | POA: Diagnosis present

## 2022-06-21 DIAGNOSIS — M47816 Spondylosis without myelopathy or radiculopathy, lumbar region: Secondary | ICD-10-CM | POA: Diagnosis present

## 2022-06-21 DIAGNOSIS — M5442 Lumbago with sciatica, left side: Secondary | ICD-10-CM | POA: Insufficient documentation

## 2022-06-21 DIAGNOSIS — M5441 Lumbago with sciatica, right side: Secondary | ICD-10-CM | POA: Diagnosis present

## 2022-06-21 DIAGNOSIS — G8929 Other chronic pain: Secondary | ICD-10-CM

## 2022-06-21 MED ORDER — IOHEXOL 180 MG/ML  SOLN
INTRAMUSCULAR | Status: AC
  Filled 2022-06-21: qty 20

## 2022-06-21 MED ORDER — LIDOCAINE HCL (PF) 1 % IJ SOLN
INTRAMUSCULAR | Status: AC
  Filled 2022-06-21: qty 10

## 2022-06-21 MED ORDER — IOHEXOL 180 MG/ML  SOLN
10.0000 mL | Freq: Once | INTRAMUSCULAR | Status: AC | PRN
  Administered 2022-06-21: 10 mL via EPIDURAL

## 2022-06-21 MED ORDER — TRIAMCINOLONE ACETONIDE 40 MG/ML IJ SUSP
40.0000 mg | Freq: Once | INTRAMUSCULAR | Status: AC
  Administered 2022-06-21: 40 mg

## 2022-06-21 MED ORDER — SODIUM CHLORIDE (PF) 0.9 % IJ SOLN
INTRAMUSCULAR | Status: AC
  Filled 2022-06-21: qty 10

## 2022-06-21 MED ORDER — LIDOCAINE HCL (PF) 1 % IJ SOLN
5.0000 mL | Freq: Once | INTRAMUSCULAR | Status: AC
  Administered 2022-06-21: 5 mL via SUBCUTANEOUS

## 2022-06-21 MED ORDER — ROPIVACAINE HCL 2 MG/ML IJ SOLN
INTRAMUSCULAR | Status: AC
  Filled 2022-06-21: qty 20

## 2022-06-21 MED ORDER — SODIUM CHLORIDE 0.9% FLUSH
10.0000 mL | Freq: Once | INTRAVENOUS | Status: AC
  Administered 2022-06-21: 10 mL

## 2022-06-21 MED ORDER — ROPIVACAINE HCL 2 MG/ML IJ SOLN
10.0000 mL | Freq: Once | INTRAMUSCULAR | Status: AC
  Administered 2022-06-21: 1 mL via EPIDURAL

## 2022-06-21 MED ORDER — TRIAMCINOLONE ACETONIDE 40 MG/ML IJ SUSP
INTRAMUSCULAR | Status: AC
  Filled 2022-06-21: qty 1

## 2022-06-21 NOTE — Patient Instructions (Signed)
Pain Management Discharge Instructions  General Discharge Instructions :  If you need to reach your doctor call: Monday-Friday 8:00 am - 4:00 pm at [phone] or toll free [phone].  After clinic hours [phone] to have operator reach doctor.  Bring all of your medication bottles to all your appointments in the pain clinic.  To cancel or reschedule your appointment with Pain Management please remember to call 24 hours in advance to avoid a fee.  Refer to the educational materials which you have been given on: General Risks, I had my Procedure. Discharge Instructions, Post Sedation.  Post Procedure Instructions:  The drugs you were given will stay in your system until tomorrow, so for the next 24 hours you should not drive, make any legal decisions or drink any alcoholic beverages.  You may eat anything you prefer, but it is better to start with liquids then soups and crackers, and gradually work up to solid foods.  Please notify your doctor immediately if you have any unusual bleeding, trouble breathing or pain that is not related to your normal pain.  Depending on the type of procedure that was done, some parts of your body may feel week and/or numb.  This usually clears up by tonight or the next day.  Walk with the use of an assistive device or accompanied by an adult for the 24 hours.  You may use ice on the affected area for the first 24 hours.  Put ice in a Ziploc bag and cover with a towel and place against area 15 minutes on 15 minutes off.  You may switch to heat after 24 hours.Epidural Steroid Injection Patient Information  Description: The epidural space surrounds the nerves as they exit the spinal cord.  In some patients, the nerves can be compressed and inflamed by a bulging disc or a tight spinal canal (spinal stenosis).  By injecting steroids into the epidural space, we can bring irritated nerves into direct contact with a potentially helpful medication.  These  steroids act directly on the irritated nerves and can reduce swelling and inflammation which often leads to decreased pain.  Epidural steroids may be injected anywhere along the spine and from the neck to the low back depending upon the location of your pain.   After numbing the skin with local anesthetic (like Novocaine), a small needle is passed into the epidural space slowly.  You may experience a sensation of pressure while this is being done.  The entire block usually last less than 10 minutes.  Conditions which may be treated by epidural steroids:  Low back and leg pain Neck and arm pain Spinal stenosis Post-laminectomy syndrome Herpes zoster (shingles) pain Pain from compression fractures  Preparation for the injection:  Do not eat any solid food or dairy products within 8 hours of your appointment.  You may drink clear liquids up to 3 hours before appointment.  Clear liquids include water, black coffee, juice or soda.  No milk or cream please. You may take your regular medication, including pain medications, with a sip of water before your appointment  Diabetics should hold regular insulin (if taken separately) and take 1/2 normal NPH dos the morning of the procedure.  Carry some sugar containing items with you to your appointment. A driver must accompany you and be prepared to drive you home after your procedure.  Bring all your current medications with your. An IV may be inserted and sedation may be given at the discretion of the physician.     A blood pressure cuff, EKG and other monitors will often be applied during the procedure.  Some patients may need to have extra oxygen administered for a short period. You will be asked to provide medical information, including your allergies, prior to the procedure.  We must know immediately if you are taking blood thinners (like Coumadin/Warfarin)  Or if you are allergic to IV iodine contrast (dye). We must know if you could possible be  pregnant.  Possible side-effects: Bleeding from needle site Infection (rare, may require surgery) Nerve injury (rare) Numbness & tingling (temporary) Difficulty urinating (rare, temporary) Spinal headache ( a headache worse with upright posture) Light -headedness (temporary) Pain at injection site (several days) Decreased blood pressure (temporary) Weakness in arm/leg (temporary) Pressure sensation in back/neck (temporary)  Call if you experience: Fever/chills associated with headache or increased back/neck pain. Headache worsened by an upright position. New onset weakness or numbness of an extremity below the injection site Hives or difficulty breathing (go to the emergency room) Inflammation or drainage at the infection site Severe back/neck pain Any new symptoms which are concerning to you  Please note:  Although the local anesthetic injected can often make your back or neck feel good for several hours after the injection, the pain will likely return.  It takes 3-7 days for steroids to work in the epidural space.  You may not notice any pain relief for at least that one week.  If effective, we will often do a series of three injections spaced 3-6 weeks apart to maximally decrease your pain.  After the initial series, we generally will wait several months before considering a repeat injection of the same type.  If you have any questions, please call [phone] [facility] 

## 2022-06-22 ENCOUNTER — Encounter: Payer: Self-pay | Admitting: Anesthesiology

## 2022-06-22 ENCOUNTER — Encounter: Payer: Medicare Other | Admitting: Physical Therapy

## 2022-06-22 NOTE — Progress Notes (Signed)
Subjective:  Patient ID: Carrie Myers, female    DOB: 06-Nov-1945  Age: 77 y.o. MRN: [id number]  CC: Back Pain (lower)   Procedure: L5-S1 epidural steroid under fluoroscopic guidance with no sedation  HPI Carrie Myers presents for reevaluation.  [name] continues to have complaints of left lower extremity pain and sciatica symptoms.  She has had previous epidurals for this and they have given her significant relief long-ter  Most recent epidurals were back in April of last year.  She has tried conservative therapy with stretching exercises and medication management but the pain she is currently experiencing has been unremitting.  In the past she has had similar complaints with epidural steroids yielding a 70 to 80% reduction in low back pain and nearly 100% relief from the sciatica symptom for 4 to 6 months before she gets a gradual recurrence of similar symptoms.  No change in strength is noted at this time though she occasionally has some giveaway weakness on the left side.  Otherwise she is in her usual state of health.  She continues to have complaints of neck pain with no change in upper extremity strength but pain was worse with extension at the neck and worse as the day progresses.  She is interested in possibly pursuing neurosurgical evaluation for possible decompressive surgery.  We have reviewed her most recent radiographs today.  She denies any complaints of upper extremity weakness or problems with grip or dropping items.  The pain primarily is worse with extension at the neck with some radiation into the right greater than left shoulder  Outpatient Medications Prior to Visit  Medication Sig Dispense Refill   Armodafinil 150 MG tablet Take 150 mg by mouth daily. For fibromyalgia fatigue     aspirin EC 81 MG tablet Take 81 mg by mouth daily.     clobetasol ointment (TEMOVATE) 0.05 % Apply topically.     clotrimazole-betamethasone (LOTRISONE) cream Apply externally BID for 2  wks 45 g 0   diazepam (VALIUM) 5 MG tablet Take 1 tablet (5 mg total) by mouth at bedtime as needed for anxiety. 30 tablet 5   Diclofenac-miSOPROStol 75-0.2 MG TBEC TAKE (1) TABLET BY MOUTH TWICE DAILY 60 tablet 5   DULoxetine (CYMBALTA) 30 MG capsule Take 3 capsules (90 mg total) by mouth daily. 270 capsule 0   HYDROcodone-acetaminophen (NORCO/VICODIN) 5-325 MG tablet Take 1 tablet by mouth 2 (two) times daily. 45 tablet 0   HYDROcodone-acetaminophen (NORCO/VICODIN) 5-325 MG tablet Take 1 tablet by mouth 2 (two) times daily. 45 tablet 0   [START ON 07/12/2022] HYDROcodone-acetaminophen (NORCO/VICODIN) 5-325 MG tablet Take 1 tablet by mouth 2 (two) times daily as needed for moderate pain. 45 tablet 0   levocetirizine (XYZAL) 5 MG tablet Take by mouth.     levothyroxine (SYNTHROID) 88 MCG tablet TAKE 1 TABLET BY MOUTH DAILY 90 tablet 2   Loperamide HCl (IMODIUM PO) Take 1 tablet by mouth as needed.     losartan (COZAAR) 100 MG tablet Take 100 mg by mouth daily.     Multiple Vitamin (MULTI-VITAMINS) TABS Take by mouth.     NON FORMULARY at bedtime. CPAP @ 11 cm H2O with Oxygen     pantoprazole (PROTONIX) 40 MG tablet Take 1 tablet by mouth 2 (two) times daily.     pravastatin (PRAVACHOL) 80 MG tablet Take 80 mg by mouth daily.     pregabalin (LYRICA) 50 MG capsule Take 50 mg by mouth 2 (two) times daily.  sucralfate (CARAFATE) 1 GM/10ML suspension Take 10 mLs (1 g total) by mouth 4 (four) times daily -  with meals and at bedtime. 420 mL 0   fluticasone-salmeterol (ADVAIR HFA) 115-21 MCG/ACT inhaler Inhale into the lungs 2 (two) times daily.     No facility-administered medications prior to visit.    Review of Systems CNS: No confusion or sedation Cardiac: No angina or palpitations GI: No abdominal pain or constipation Constitutional: No nausea vomiting fevers or chills  Objective:  BP 125/60   Pulse 84   Temp 97.7 F (36.5 C) (Temporal)   Resp 16   Ht 5\' 8"  (1.727 m)   Wt 180 lb  (81.6 kg)   SpO2 96%   BMI 27.37 kg/m    BP Readings from Last 3 Encounters:  06/21/22 125/60  05/21/22 128/78  05/13/22 (!) 164/88     Wt Readings from Last 3 Encounters:  06/21/22 180 lb (81.6 kg)  05/21/22 177 lb (80.3 kg)  05/13/22 176 lb (79.8 kg)     Physical Exam Pt is alert and oriented PERRL EOMI HEART IS RRR no murmur or rub LCTA no wheezing or rales MUSCULOSKELETAL reveals some paraspinous muscle tenderness in the neck with pain on extension and some tenderness in the right greater than left trapezius musculature.  She has good range of motion of the glenohumeral joints.  Her grip strength is good.  In regards to her lower extremity evaluation.  She has some paraspinous muscle tenderness.  She has a positive left leg stretch on the left side versus right side.  She walks with a mildly antalgic gait but seems to be moving well.  Muscle tone and bulk is at baseline.  Labs  Lab Results  Component Value Date   HGBA1C 6.3 (H) 04/21/2022   HGBA1C 6.1 (H) 09/05/2020   HGBA1C 6.5 (H) 03/28/2020   Lab Results  Component Value Date   LDLCALC 64 04/21/2022   CREATININE 0.87 04/21/2022    -------------------------------------------------------------------------------------------------------------------- Lab Results  Component Value Date   WBC 5.3 04/21/2022   HGB 12.2 04/21/2022   HCT 37.7 04/21/2022   PLT 302 04/21/2022   GLUCOSE 94 04/21/2022   CHOL 140 04/21/2022   TRIG 120 04/21/2022   HDL 55 04/21/2022   LDLCALC 64 04/21/2022   ALT 26 04/21/2022   AST 28 04/21/2022   NA 140 04/21/2022   K 4.4 04/21/2022   CL 100 04/21/2022   CREATININE 0.87 04/21/2022   BUN 21 04/21/2022   CO2 24 04/21/2022   TSH 1.880 04/21/2022   HGBA1C 6.3 (H) 04/21/2022    --------------------------------------------------------------------------------------------------------------------- DG PAIN CLINIC C-ARM 1-60 MIN NO REPORT  Result Date: 06/21/2022 Fluoro was used, but no  Radiologist interpretation will be provided. Please refer to "NOTES" tab for provider progress note.    Assessment & Plan:   Harriett Sineancy was seen today for back pain.  Diagnoses and all orders for this visit:  Chronic, continuous use of opioids  Chronic bilateral low back pain without sciatica -     triamcinolone acetonide (KENALOG-40) injection 40 mg -     sodium chloride flush (NS) 0.9 % injection 10 mL -     ropivacaine (PF) 2 mg/mL (0.2%) (NAROPIN) injection 10 mL -     lidocaine (PF) (XYLOCAINE) 1 % injection 5 mL -     iohexol (OMNIPAQUE) 180 MG/ML injection 10 mL  Fibromyalgia  Facet syndrome, lumbar  Chronic pain syndrome  Chronic bilateral low back pain with bilateral sciatica -  triamcinolone acetonide (KENALOG-40) injection 40 mg -     sodium chloride flush (NS) 0.9 % injection 10 mL -     ropivacaine (PF) 2 mg/mL (0.2%) (NAROPIN) injection 10 mL -     lidocaine (PF) (XYLOCAINE) 1 % injection 5 mL -     iohexol (OMNIPAQUE) 180 MG/ML injection 10 mL  DDD (degenerative disc disease), lumbar -     triamcinolone acetonide (KENALOG-40) injection 40 mg -     sodium chloride flush (NS) 0.9 % injection 10 mL -     ropivacaine (PF) 2 mg/mL (0.2%) (NAROPIN) injection 10 mL -     lidocaine (PF) (XYLOCAINE) 1 % injection 5 mL -     iohexol (OMNIPAQUE) 180 MG/ML injection 10 mL  Sciatica, left side  Pain of both shoulder joints  Cervical radiculitis        ----------------------------------------------------------------------------------------------------------------------  Problem List Items Addressed This Visit       Unprioritized   Chronic pain syndrome (Chronic)   Fibromyalgia (Chronic)   Facet syndrome, lumbar   Sciatica, left side   Other Visit Diagnoses     Chronic, continuous use of opioids    -  Primary   Chronic bilateral low back pain without sciatica       Relevant Medications   triamcinolone acetonide (KENALOG-40) injection 40 mg (Completed)    sodium chloride flush (NS) 0.9 % injection 10 mL (Completed)   ropivacaine (PF) 2 mg/mL (0.2%) (NAROPIN) injection 10 mL (Completed)   lidocaine (PF) (XYLOCAINE) 1 % injection 5 mL (Completed)   iohexol (OMNIPAQUE) 180 MG/ML injection 10 mL (Completed)   Chronic bilateral low back pain with bilateral sciatica       Relevant Medications   triamcinolone acetonide (KENALOG-40) injection 40 mg (Completed)   sodium chloride flush (NS) 0.9 % injection 10 mL (Completed)   ropivacaine (PF) 2 mg/mL (0.2%) (NAROPIN) injection 10 mL (Completed)   lidocaine (PF) (XYLOCAINE) 1 % injection 5 mL (Completed)   iohexol (OMNIPAQUE) 180 MG/ML injection 10 mL (Completed)   DDD (degenerative disc disease), lumbar       Relevant Medications   triamcinolone acetonide (KENALOG-40) injection 40 mg (Completed)   sodium chloride flush (NS) 0.9 % injection 10 mL (Completed)   ropivacaine (PF) 2 mg/mL (0.2%) (NAROPIN) injection 10 mL (Completed)   lidocaine (PF) (XYLOCAINE) 1 % injection 5 mL (Completed)   iohexol (OMNIPAQUE) 180 MG/ML injection 10 mL (Completed)   Pain of both shoulder joints       Cervical radiculitis             ----------------------------------------------------------------------------------------------------------------------  1. Chronic, continuous use of opioids Continue current medication management.  She is using her opioids conservatively.  She knows not to overlap these with her benzodiazepines at bedtime helping with sleep and muscle spasm.  She is averaging about 1 or 2 hydrocodone per day for approximately 45 tablets/month.  This is without side effect and continued improvement in neck and low back pain.  She has failed more conservative therapy and this seems to be working well.  I have reviewed her [facility] practitioner database information is appropriate to continue on his current therapy.  We talked about the risk and benefits of chronic opioid management etc.  2. Chronic  bilateral low back pain without sciatica As above and we will proceed with a repeat epidural injection.  In the past she has had 75% reduction in low back pain and nearly complete relief of her sciatica symptoms.  Her  last epidural was back in April of last year.  Continue with core stretching strengthening exercises. - triamcinolone acetonide (KENALOG-40) injection 40 mg - sodium chloride flush (NS) 0.9 % injection 10 mL - ropivacaine (PF) 2 mg/mL (0.2%) (NAROPIN) injection 10 mL - lidocaine (PF) (XYLOCAINE) 1 % injection 5 mL - iohexol (OMNIPAQUE) 180 MG/ML injection 10 mL  3. Fibromyalgia As above with continued core stretching strengthening  4. Facet syndrome, lumbar   5. Chronic pain syndrome As above  6. Chronic bilateral low back pain with bilateral sciatica As above - triamcinolone acetonide (KENALOG-40) injection 40 mg - sodium chloride flush (NS) 0.9 % injection 10 mL - ropivacaine (PF) 2 mg/mL (0.2%) (NAROPIN) injection 10 mL - lidocaine (PF) (XYLOCAINE) 1 % injection 5 mL - iohexol (OMNIPAQUE) 180 MG/ML injection 10 mL  7. DDD (degenerative disc disease), lumbar As above - triamcinolone acetonide (KENALOG-40) injection 40 mg - sodium chloride flush (NS) 0.9 % injection 10 mL - ropivacaine (PF) 2 mg/mL (0.2%) (NAROPIN) injection 10 mL - lidocaine (PF) (XYLOCAINE) 1 % injection 5 mL - iohexol (OMNIPAQUE) 180 MG/ML injection 10 mL  8. Sciatica, left side As above  9. Pain of both shoulder joints I am encouraged that she is seeking potential neurosurgical evaluation.  We did review her most recent radiographic films.  We are currently scheduled for follow-up in approximately 1 month and contingent on her current symptom status we may plan on a cervical epidural.  We talked about that as an option but she would rather seek neurosurgical evaluation in advance of that based on today's conversation.  I think that is reasonable.  If her symptoms change or worsen she is  instructed to notify us or seek further medical assistance.  10. Cervical radiculitis As above    ----------------------------------------------------------------------------------------------------------------------  I am having Carrie Myers maintain her pravastatin, aspirin EC, Multi-Vitamins, Loperamide HCl (IMODIUM PO), pantoprazole, Armodafinil, pregabalin, levocetirizine, NON FORMULARY, Advair HFA, losartan, clotrimazole-betamethasone, Diclofenac-miSOPROStol, sucralfate, DULoxetine, clobetasol ointment, HYDROcodone-acetaminophen, HYDROcodone-acetaminophen, diazepam, levothyroxine, and HYDROcodone-acetaminophen. We administered triamcinolone acetonide, sodium chloride flush, ropivacaine (PF) 2 mg/mL (0.2%), lidocaine (PF), and iohexol.   Meds ordered this encounter  Medications   triamcinolone acetonide (KENALOG-40) injection 40 mg   sodium chloride flush (NS) 0.9 % injection 10 mL   ropivacaine (PF) 2 mg/mL (0.2%) (NAROPIN) injection 10 mL   lidocaine (PF) (XYLOCAINE) 1 % injection 5 mL   iohexol (OMNIPAQUE) 180 MG/ML injection 10 mL   Patient's Medications  New Prescriptions   No medications on file  Previous Medications   ARMODAFINIL 150 MG TABLET    Take 150 mg by mouth daily. For fibromyalgia fatigue   ASPIRIN EC 81 MG TABLET    Take 81 mg by mouth daily.   CLOBETASOL OINTMENT (TEMOVATE) 0.05 %    Apply topically.   CLOTRIMAZOLE-BETAMETHASONE (LOTRISONE) CREAM    Apply externally BID for 2 wks   DIAZEPAM (VALIUM) 5 MG TABLET    Take 1 tablet (5 mg total) by mouth at bedtime as needed for anxiety.   DICLOFENAC-MISOPROSTOL 75-0.2 MG TBEC    TAKE (1) TABLET BY MOUTH TWICE DAILY   DULOXETINE (CYMBALTA) 30 MG CAPSULE    Take 3 capsules (90 mg total) by mouth daily.   FLUTICASONE-SALMETEROL (ADVAIR HFA) 115-21 MCG/ACT INHALER    Inhale into the lungs 2 (two) times daily.   HYDROCODONE-ACETAMINOPHEN (NORCO/VICODIN) 5-325 MG TABLET    Take 1 tablet by mouth 2 (two) times  daily.   HYDROCODONE-ACETAMINOPHEN (NORCO/VICODIN) 5-325 MG TABLET  Take 1 tablet by mouth 2 (two) times daily.   HYDROCODONE-ACETAMINOPHEN (NORCO/VICODIN) 5-325 MG TABLET    Take 1 tablet by mouth 2 (two) times daily as needed for moderate pain.   LEVOCETIRIZINE (XYZAL) 5 MG TABLET    Take by mouth.   LEVOTHYROXINE (SYNTHROID) 88 MCG TABLET    TAKE 1 TABLET BY MOUTH DAILY   LOPERAMIDE HCL (IMODIUM PO)    Take 1 tablet by mouth as needed.   LOSARTAN (COZAAR) 100 MG TABLET    Take 100 mg by mouth daily.   MULTIPLE VITAMIN (MULTI-VITAMINS) TABS    Take by mouth.   NON FORMULARY    at bedtime. CPAP @ 11 cm H2O with Oxygen   PANTOPRAZOLE (PROTONIX) 40 MG TABLET    Take 1 tablet by mouth 2 (two) times daily.   PRAVASTATIN (PRAVACHOL) 80 MG TABLET    Take 80 mg by mouth daily.   PREGABALIN (LYRICA) 50 MG CAPSULE    Take 50 mg by mouth 2 (two) times daily.   SUCRALFATE (CARAFATE) 1 GM/10ML SUSPENSION    Take 10 mLs (1 g total) by mouth 4 (four) times daily -  with meals and at bedtime.  Modified Medications   No medications on file  Discontinued Medications   No medications on file   ----------------------------------------------------------------------------------------------------------------------  Follow-up: No follow-ups on file.   Procedure: L5-S1 LESI with fluoroscopic guidance and without moderate sedation  NOTE: The risks, benefits, and expectations of the procedure have been discussed and explained to the patient who was understanding and in agreement with suggested treatment plan. No guarantees were made.  DESCRIPTION OF PROCEDURE: Lumbar epidural steroid injection with no IV Versed, EKG, blood pressure, pulse, and pulse oximetry monitoring. The procedure was performed with the patient in the prone position under fluoroscopic guidance.  Sterile prep x3 was initiated and I then injected subcutaneous lidocaine to the overlying L5-S1 site after its fluoroscopic identifictation.  Using  strict aseptic technique, I then advanced an 18-gauge Tuohy epidural needle in the midline using interlaminar approach via loss-of-resistance to saline technique. There was negative aspiration for heme or  CSF.  I then confirmed position with both AP and Lateral fluoroscan.  2 cc of contrast dye were injected and a  total of 5 mL of Preservative-Free normal saline mixed with 40 mg of Kenalog and 1cc Ropicaine 0.2 percent were injected incrementally via the  epidurally placed needle. The needle was removed. The patient tolerated the injection well and was convalesced and discharged to home in stable condition. Should the patient have any post procedure difficulty they have been instructed on how to contact us for assistance.   Yevette Edwards, MD

## 2022-06-24 ENCOUNTER — Encounter: Payer: Medicare Other | Admitting: Physical Therapy

## 2022-06-29 ENCOUNTER — Ambulatory Visit: Payer: Medicare Other | Admitting: Physical Therapy

## 2022-07-01 ENCOUNTER — Encounter: Payer: Medicare Other | Admitting: Physical Therapy

## 2022-07-06 ENCOUNTER — Encounter: Payer: Medicare Other | Admitting: Physical Therapy

## 2022-07-08 ENCOUNTER — Encounter: Payer: Medicare Other | Admitting: Physical Therapy

## 2022-07-08 ENCOUNTER — Ambulatory Visit (INDEPENDENT_AMBULATORY_CARE_PROVIDER_SITE_OTHER): Payer: Medicare Other

## 2022-07-08 VITALS — Ht 68.0 in | Wt 177.0 lb

## 2022-07-08 DIAGNOSIS — Z0000 Encounter for general adult medical examination without abnormal findings: Secondary | ICD-10-CM

## 2022-07-08 NOTE — Progress Notes (Signed)
I connected with  [name] on 07/08/22 by a audio enabled telemedicine application and verified that I am speaking with the correct person using two identifiers.  Patient Location: Home  Provider Location: Office/Clinic  I discussed the limitations of evaluation and management by telemedicine. The patient expressed understanding and agreed to proceed.  Subjective:   BRITNAY MAGNUSSEN is a 77 y.o. female who presents for Medicare Annual (Subsequent) preventive examination.  Review of Systems     Cardiac Risk Factors include: advanced age (>11men, >26 women);hypertension;dyslipidemia     Objective:    Today's Vitals   07/08/22 0929  PainSc: 5    There is no height or weight on file to calculate BMI.     06/21/2022    2:45 PM 05/13/2022    1:51 PM 01/26/2022    1:23 PM 01/06/2022    1:43 PM 06/29/2021   11:31 AM 06/23/2021    2:03 PM 12/31/2020   11:25 AM  Advanced Directives  Does Patient Have a Medical Advance Directive? Yes Yes Yes Yes Yes Yes Yes  Type of Advance Directive  Living will Healthcare Power of [place];Living will Healthcare Power of [place];Living will Healthcare Power of [place];Living will  Healthcare Power of [place];Living will  Does patient want to make changes to medical advance directive?       No - Patient declined  Copy of Healthcare Power of Attorney in Chart?     No - copy requested      Current Medications (verified) Outpatient Encounter Medications as of 07/08/2022  Medication Sig   Armodafinil 150 MG tablet Take 150 mg by mouth daily. For fibromyalgia fatigue   aspirin EC 81 MG tablet Take 81 mg by mouth daily.   clobetasol ointment (TEMOVATE) 0.05 % Apply topically.   clotrimazole-betamethasone (LOTRISONE) cream Apply externally BID for 2 wks   diazepam (VALIUM) 5 MG tablet Take 1 tablet (5 mg total) by mouth at bedtime as needed for anxiety.   Diclofenac-miSOPROStol 75-0.2 MG TBEC TAKE (1) TABLET BY MOUTH TWICE DAILY    DULoxetine (CYMBALTA) 30 MG capsule Take 3 capsules (90 mg total) by mouth daily.   HYDROcodone-acetaminophen (NORCO/VICODIN) 5-325 MG tablet Take 1 tablet by mouth 2 (two) times daily.   HYDROcodone-acetaminophen (NORCO/VICODIN) 5-325 MG tablet Take 1 tablet by mouth 2 (two) times daily.   [START ON 07/12/2022] HYDROcodone-acetaminophen (NORCO/VICODIN) 5-325 MG tablet Take 1 tablet by mouth 2 (two) times daily as needed for moderate pain.   levocetirizine (XYZAL) 5 MG tablet Take by mouth.   levothyroxine (SYNTHROID) 88 MCG tablet TAKE 1 TABLET BY MOUTH DAILY   Loperamide HCl (IMODIUM PO) Take 1 tablet by mouth as needed.   losartan (COZAAR) 100 MG tablet Take 100 mg by mouth daily.   Multiple Vitamin (MULTI-VITAMINS) TABS Take by mouth.   NON FORMULARY at bedtime. CPAP @ 11 cm H2O with Oxygen   pantoprazole (PROTONIX) 40 MG tablet Take 1 tablet by mouth 2 (two) times daily.   pravastatin (PRAVACHOL) 80 MG tablet Take 80 mg by mouth daily.   pregabalin (LYRICA) 50 MG capsule Take 50 mg by mouth 2 (two) times daily.   sucralfate (CARAFATE) 1 GM/10ML suspension Take 10 mLs (1 g total) by mouth 4 (four) times daily -  with meals and at bedtime.   fluticasone-salmeterol (ADVAIR HFA) 115-21 MCG/ACT inhaler Inhale into the lungs 2 (two) times daily.   No facility-administered encounter medications on file as of 07/08/2022.    Allergies (verified) Amoxicillin-pot clavulanate,  Codeine, Sulfa antibiotics, Proparacaine, and Shellfish allergy   History: Past Medical History:  Diagnosis Date   Allergy    Anxiety    Arthritis    neck, hands, lower back   Bilateral dry eyes    Chronic low back pain 12/23/2016   Coronary artery disease    DDD (degenerative disc disease), lumbar 10/02/2014   Dental crowns present    Implants, front "flipper" while waiting for other implants   Emphysema of lung    Fibromyalgia    Fibromyalgia    Fracture of right foot 01/14/2015   GERD (gastroesophageal reflux  disease)    History of Graves' disease    Hyperlipidemia    Hypertension    Hypothyroidism    Myocardial infarction 04/22/2006   Osteoarthritis    Other shoulder lesions, right shoulder 04/17/2015   Rheumatoid arthritis    Sciatica, left side 01/01/2019   Sleep apnea    CPAP   Status post shoulder surgery 05/27/2015   Thyroid disease    Vertigo    none for approx 9 yrs   Past Surgical History:  Procedure Laterality Date   ABDOMINAL HYSTERECTOMY     total   APPENDECTOMY     BREAST BIOPSY Right    neg   BREAST CYST ASPIRATION Left    neg   BREAST EXCISIONAL BIOPSY Right    BUNIONECTOMY Bilateral    CESAREAN SECTION     COLONOSCOPY  2010   normal   COLONOSCOPY WITH PROPOFOL N/A 04/03/2018   Procedure: COLONOSCOPY WITH BIOPSIES;  Surgeon: Midge Minium, MD;  Location: [facility];  Service: Endoscopy;  Laterality: N/A;  sleep apnea   CORONARY ANGIOPLASTY WITH STENT PLACEMENT  04/2006   dental implant     [age]   DILATION AND CURETTAGE OF UTERUS     epidural steroid injection  2018   ESOPHAGOGASTRODUODENOSCOPY N/A 10/03/2020   Procedure: ESOPHAGOGASTRODUODENOSCOPY (EGD);  Surgeon: Regis Bill, MD;  Location: [facility];  Service: Endoscopy;  Laterality: N/A;  REQUESTING MORNING AFTER 9 AM   EYE SURGERY     bilateral cataract extraction   SHOULDER ARTHROSCOPY Right 05/07/2015   Procedure: Extensive arthroscopic debridement and arthroscopic subacromial decompression, right shoulder.                        2.  Steroid injection right thumb CMC joint.;  Surgeon: Christena Flake, MD;  Location: [facility];  Service: Orthopedics;  Laterality: Right;   TOOTH EXTRACTION     Upper centrals and lateral   Family History  Problem Relation Age of Onset   Stroke Mother    Heart disease Father    Pancreatic cancer Sister    Diabetes Sister    Prostate cancer Brother    Breast cancer Neg Hx    Kidney cancer Neg Hx    Bladder Cancer Neg Hx    Social  History   Socioeconomic History   Marital status: Widowed    Spouse name: Not on file   Number of children: 1   Years of education: 15   Highest education level: High school graduate  Occupational History   Occupation: retired   Tobacco Use   Smoking status: Former    Packs/day: 1.25    Years: 44.00    Additional pack years: 0.00    Total pack years: 55.00    Types: Cigarettes    Quit date: 04/22/2006    Years since quitting: 16.2  Smokeless tobacco: Never   Tobacco comments:       Vaping Use   Vaping Use: Never used  Substance and Sexual Activity   Alcohol use: Not Currently   Drug use: Never   Sexual activity: Not Currently    Partners: Male    Birth control/protection: None  Other Topics Concern   Not on file  Social History Narrative   Pt lives alone    Social Determinants of Health   Financial Resource Strain: Low Risk  (07/08/2022)   Overall Financial Resource Strain (CARDIA)    Difficulty of Paying Living Expenses: Not hard at all  Food Insecurity: No Food Insecurity (07/08/2022)   Hunger Vital Sign    Worried About Running Out of Food in the Last Year: Never true    Ran Out of Food in the Last Year: Never true  Transportation Needs: No Transportation Needs (07/08/2022)   PRAPARE - Administrator, Civil Service (Medical): No    Lack of Transportation (Non-Medical): No  Physical Activity: Insufficiently Active (07/08/2022)   Exercise Vital Sign    Days of Exercise per Week: 2 days    Minutes of Exercise per Session: 20 min  Stress: No Stress Concern Present (07/08/2022)   [employer] of Occupational Health - Occupational Stress Questionnaire    Feeling of Stress : Only a little  Social Connections: Moderately Isolated (07/08/2022)   Social Connection and Isolation Panel [NHANES]    Frequency of Communication with Friends and Family: More than three times a week    Frequency of Social Gatherings with Friends and Family: More than three times a  week    Attends Religious Services: More than 4 times per year    Active Member of [employer] or Organizations: No    Attends Banker Meetings: Never    Marital Status: Widowed    Tobacco Counseling Counseling given: Not Answered Tobacco comments:     Clinical Intake:  Pre-visit preparation completed: Yes  Pain : 0-10 Pain Score: 5  Pain Type: Chronic pain Pain Location: Neck     Nutritional Risks: None Diabetes: No  How often do you need to have someone help you when you read instructions, pamphlets, or other written materials from your doctor or pharmacy?: 1 - Never  Diabetic?no  Interpreter Needed?: No  Information entered by :: Kennedy Bucker, LPN   Activities of Daily Living    07/08/2022    9:37 AM  In your present state of health, do you have any difficulty performing the following activities:  Hearing? 0  Vision? 0  Difficulty concentrating or making decisions? 0  Walking or climbing stairs? 1  Comment going up is difficult  Dressing or bathing? 0  Doing errands, shopping? 0  Preparing Food and eating ? N  Using the Toilet? N  In the past six months, have you accidently leaked urine? N  Do you have problems with loss of bowel control? N  Managing your Medications? N  Managing your Finances? N  Housekeeping or managing your Housekeeping? N    Patient Care Team: [name], MD as PCP - General (Internal Medicine) Lamar Blinks, MD as Consulting Physician (Cardiology) Hulan Fray as Physician Assistant (Urology) Deirdre Evener, MD (Dermatology) Linus Salmons, MD (Otolaryngology) Yevette Edwards, MD as Consulting Physician (Anesthesiology) Vida Rigger, MD as Consulting Physician (Pulmonary Disease) Wynona Canes (Gastroenterology) Mia [place] Rossie Muskrat, MD as Consulting Physician (Gastroenterology)  Indicate any recent Medical  Services you may have received from other than [facility] providers in the past  year (date may be approximate).     Assessment:   This is a routine wellness examination for [place].  Hearing/Vision screen Hearing Screening - Comments:: No aids Vision Screening - Comments:: Wears glasses- [facility]  Dietary issues and exercise activities discussed: Current Exercise Habits: Home exercise routine, Type of exercise: walking, Time (Minutes): 20, Frequency (Times/Week): 2, Weekly Exercise (Minutes/Week): 40, Intensity: Mild   Goals Addressed             This Visit's Progress    DIET - EAT MORE FRUITS AND VEGETABLES         Depression Screen    07/08/2022    9:34 AM 06/21/2022    2:45 PM 05/21/2022    3:42 PM 05/13/2022    1:50 PM 04/21/2022   10:58 AM 02/23/2022    1:59 PM 01/26/2022    1:06 PM  PHQ 2/9 Scores  PHQ - 2 Score 0 0 0 0 3 0 0  PHQ- 9 Score 0  Fall Risk    07/08/2022    9:36 AM 06/21/2022    2:45 PM 05/13/2022    1:50 PM 02/23/2022    1:59 PM 01/26/2022    1:06 PM  Fall Risk   Falls in the past year? 1 0 0 0 0  Number falls in past yr: 0   0   Injury with Fall? 0   0   Risk for fall due to : History of fall(s)   No Fall Risks   Follow up Falls prevention discussed;Falls evaluation completed   Falls evaluation completed     FALL RISK PREVENTION PERTAINING TO THE HOME:  Any stairs in or around the home? No  If so, are there any without handrails? No  Home free of loose throw rugs in walkways, pet beds, electrical cords, etc? Yes  Adequate lighting in your home to reduce risk of falls? Yes   ASSISTIVE DEVICES UTILIZED TO PREVENT FALLS:  Life alert? No  Use of a cane, walker or w/c? Yes - cane Grab bars in the bathroom? Yes  Shower chair or bench in shower? Yes  Elevated toilet seat or a handicapped toilet? No        Cognitive Function:        07/08/2022    9:42 AM 05/07/2019    2:25 PM 04/17/2018   11:30 AM 03/18/2017    9:57 AM 03/16/2016    9:48 AM  6CIT Screen  What Year? 0 points 0 points 0 points 0 points 0  points  What month? 0 points 0 points 0 points 0 points 0 points  What time? 0 points 0 points 0 points 0 points 0 points  Count back from 20 0 points 0 points 0 points 0 points 0 points  Months in reverse 0 points 0 points 0 points 0 points 0 points  Repeat phrase 0 points 0 points 0 points 2 points 0 points  Total Score 0 points 0 points 0 points 2 points 0 points    Immunizations Immunization History  Administered Date(s) Administered   Fluad Quad(high Dose 65+) 03/28/2019, 03/28/2020, 03/31/2021   Influenza, High Dose Seasonal PF 12/31/2016, 12/05/2017   Moderna Sars-Covid-2 Vaccination 11/09/2019, 12/13/2019   Pneumococcal Conjugate-13 01/30/2014   Pneumococcal Polysaccharide-23 09/22/2012   Tdap 09/08/2011    TDAP status: Due, Education has been provided regarding the importance  of this vaccine. Advised may receive this vaccine at local pharmacy or Health Dept. Aware to provide a copy of the vaccination record if obtained from local pharmacy or Health Dept. Verbalized acceptance and understanding.  Flu Vaccine status: Up to date  Pneumococcal vaccine status: Up to date  Covid-19 vaccine status: Completed vaccines  Qualifies for Shingles Vaccine? Yes   Zostavax completed No   Shingrix Completed?: No.    Education has been provided regarding the importance of this vaccine. Patient has been advised to call insurance company to determine out of pocket expense if they have not yet received this vaccine. Advised may also receive vaccine at local pharmacy or Health Dept. Verbalized acceptance and understanding.  Screening Tests Health Maintenance  Topic Date Due   Zoster Vaccines- Shingrix (1 of 2) Never done   DTaP/Tdap/Td (2 - Td or Tdap) 09/07/2021   COVID-19 Vaccine (3 - 2023-24 season) 11/13/2021   INFLUENZA VACCINE  10/14/2022   MAMMOGRAM  11/05/2022   Medicare Annual Wellness (AWV)  07/08/2023   COLONOSCOPY (Pts 45-19yrs Insurance coverage will need to be confirmed)   04/03/2028   Pneumonia Vaccine 40+ Years old  Completed   DEXA SCAN  Completed   Hepatitis C Screening  Completed   HPV VACCINES  Aged Out    Health Maintenance  Health Maintenance Due  Topic Date Due   Zoster Vaccines- Shingrix (1 of 2) Never done   DTaP/Tdap/Td (2 - Td or Tdap) 09/07/2021   COVID-19 Vaccine (3 - 2023-24 season) 11/13/2021    Colorectal cancer screening: Type of screening: Colonoscopy. Completed 04/03/18. Repeat every 10 years  Mammogram status: Completed 11/04/21. Repeat every year  Bone Density status: Completed 05/28/20. Results reflect: Bone density results: NORMAL. Repeat every 5 years.  Lung Cancer Screening: (Low Dose CT Chest recommended if Age 50-80 years, 30 pack-year currently smoking OR have quit w/in 15years.) does not qualify.   Additional Screening:  Hepatitis C Screening: does qualify; Completed 03/16/16  Vision Screening: Recommended annual ophthalmology exams for early detection of glaucoma and other disorders of the eye. Is the patient up to date with their annual eye exam?  Yes  Who is the provider or what is the name of the office in which the patient attends annual eye exams? [facility] If pt is not established with a provider, would they like to be referred to a provider to establish care? No .   Dental Screening: Recommended annual dental exams for proper oral hygiene  Community Resource Referral / Chronic Care Management: CRR required this visit?  No   CCM required this visit?  No      Plan:     I have personally reviewed and noted the following in the patient's chart:   Medical and social history Use of alcohol, tobacco or illicit drugs  Current medications and supplements including opioid prescriptions. Patient is currently taking opioid prescriptions. Information provided to patient regarding non-opioid alternatives. Patient advised to discuss non-opioid treatment plan with their provider. Functional ability and  status Nutritional status Physical activity Advanced directives List of other physicians Hospitalizations, surgeries, and ER visits in previous 12 months Vitals Screenings to include cognitive, depression, and falls Referrals and appointments  In addition, I have reviewed and discussed with patient certain preventive protocols, quality metrics, and best practice recommendations. A written personalized care plan for preventive services as well as general preventive health recommendations were provided to patient.     Hal Hope, LPN   06/21/8117  Nurse Notes: none

## 2022-07-08 NOTE — Patient Instructions (Signed)
Carrie Myers , Thank you for taking time to come for your Medicare Wellness Visit. I appreciate your ongoing commitment to your health goals. Please review the following plan we discussed and let me know if I can assist you in the future.   These are the goals we discussed:  Goals      DIET - EAT MORE FRUITS AND VEGETABLES     Increase physical activity     Increase physical activity to 150 minutes per week.      Weight (lb) < 200 lb (90.7 kg)        This is a list of the screening recommended for you and due dates:  Health Maintenance  Topic Date Due   Zoster (Shingles) Vaccine (1 of 2) Never done   DTaP/Tdap/Td vaccine (2 - Td or Tdap) 09/07/2021   COVID-19 Vaccine (3 - 2023-24 season) 11/13/2021   Flu Shot  10/14/2022   Mammogram  11/05/2022   Medicare Annual Wellness Visit  07/08/2023   Colon Cancer Screening  04/03/2028   Pneumonia Vaccine  Completed   DEXA scan (bone density measurement)  Completed   Hepatitis C Screening: USPSTF Recommendation to screen - Ages 33-79 yo.  Completed   HPV Vaccine  Aged Out    Advanced directives: no  Conditions/risks identified: none  Next appointment: Follow up in one year for your annual wellness visit 07/14/23 @ 9:30 am by phone   Preventive Care 65 Years and Older, Female Preventive care refers to lifestyle choices and visits with your health care provider that can promote health and wellness. What does preventive care include? A yearly physical exam. This is also called an annual well check. Dental exams once or twice a year. Routine eye exams. Ask your health care provider how often you should have your eyes checked. Personal lifestyle choices, including: Daily care of your teeth and gums. Regular physical activity. Eating a healthy diet. Avoiding tobacco and drug use. Limiting alcohol use. Practicing safe sex. Taking low-dose aspirin every day. Taking vitamin and mineral supplements as recommended by your health care  provider. What happens during an annual well check? The services and screenings done by your health care provider during your annual well check will depend on your age, overall health, lifestyle risk factors, and family history of disease. Counseling  Your health care provider may ask you questions about your: Alcohol use. Tobacco use. Drug use. Emotional well-being. Home and relationship well-being. Sexual activity. Eating habits. History of falls. Memory and ability to understand (cognition). Work and work Astronomer. Reproductive health. Screening  You may have the following tests or measurements: Height, weight, and BMI. Blood pressure. Lipid and cholesterol levels. These may be checked every 5 years, or more frequently if you are over 21 years old. Skin check. Lung cancer screening. You may have this screening every year starting at age 8 if you have a 30-pack-year history of smoking and currently smoke or have quit within the past 15 years. Fecal occult blood test (FOBT) of the stool. You may have this test every year starting at age 55. Flexible sigmoidoscopy or colonoscopy. You may have a sigmoidoscopy every 5 years or a colonoscopy every 10 years starting at age 55. Hepatitis C blood test. Hepatitis B blood test. Sexually transmitted disease (STD) testing. Diabetes screening. This is done by checking your blood sugar (glucose) after you have not eaten for a while (fasting). You may have this done every 1-3 years. Bone density scan. This is done to  screen for osteoporosis. You may have this done starting at age 42. Mammogram. This may be done every 1-2 years. Talk to your health care provider about how often you should have regular mammograms. Talk with your health care provider about your test results, treatment options, and if necessary, the need for more tests. Vaccines  Your health care provider may recommend certain vaccines, such as: Influenza vaccine. This is  recommended every year. Tetanus, diphtheria, and acellular pertussis (Tdap, Td) vaccine. You may need a Td booster every 10 years. Zoster vaccine. You may need this after age 82. Pneumococcal 13-valent conjugate (PCV13) vaccine. One dose is recommended after age 20. Pneumococcal polysaccharide (PPSV23) vaccine. One dose is recommended after age 13. Talk to your health care provider about which screenings and vaccines you need and how often you need them. This information is not intended to replace advice given to you by your health care provider. Make sure you discuss any questions you have with your health care provider. Document Released: 03/28/2015 Document Revised: 11/19/2015 Document Reviewed: 12/31/2014 Elsevier Interactive Patient Education  2017 [place] Prevention in the Home Falls can cause injuries. They can happen to people of all ages. There are many things you can do to make your home safe and to help prevent falls. What can I do on the outside of my home? Regularly fix the edges of walkways and driveways and fix any cracks. Remove anything that might make you trip as you walk through a door, such as a raised step or threshold. Trim any bushes or trees on the path to your home. Use bright outdoor lighting. Clear any walking paths of anything that might make someone trip, such as rocks or tools. Regularly check to see if handrails are loose or broken. Make sure that both sides of any steps have handrails. Any raised decks and porches should have guardrails on the edges. Have any leaves, snow, or ice cleared regularly. Use sand or salt on walking paths during winter. Clean up any spills in your garage right away. This includes oil or grease spills. What can I do in the bathroom? Use night lights. Install grab bars by the toilet and in the tub and shower. Do not use towel bars as grab bars. Use non-skid mats or decals in the tub or shower. If you need to sit down in  the shower, use a plastic, non-slip stool. Keep the floor dry. Clean up any water that spills on the floor as soon as it happens. Remove soap buildup in the tub or shower regularly. Attach bath mats securely with double-sided non-slip rug tape. Do not have throw rugs and other things on the floor that can make you trip. What can I do in the bedroom? Use night lights. Make sure that you have a light by your bed that is easy to reach. Do not use any sheets or blankets that are too big for your bed. They should not hang down onto the floor. Have a firm chair that has side arms. You can use this for support while you get dressed. Do not have throw rugs and other things on the floor that can make you trip. What can I do in the kitchen? Clean up any spills right away. Avoid walking on wet floors. Keep items that you use a lot in easy-to-reach places. If you need to reach something above you, use a strong step stool that has a grab bar. Keep electrical cords out of the way.  Do not use floor polish or wax that makes floors slippery. If you must use wax, use non-skid floor wax. Do not have throw rugs and other things on the floor that can make you trip. What can I do with my stairs? Do not leave any items on the stairs. Make sure that there are handrails on both sides of the stairs and use them. Fix handrails that are broken or loose. Make sure that handrails are as long as the stairways. Check any carpeting to make sure that it is firmly attached to the stairs. Fix any carpet that is loose or worn. Avoid having throw rugs at the top or bottom of the stairs. If you do have throw rugs, attach them to the floor with carpet tape. Make sure that you have a light switch at the top of the stairs and the bottom of the stairs. If you do not have them, ask someone to add them for you. What else can I do to help prevent falls? Wear shoes that: Do not have high heels. Have rubber bottoms. Are comfortable  and fit you well. Are closed at the toe. Do not wear sandals. If you use a stepladder: Make sure that it is fully opened. Do not climb a closed stepladder. Make sure that both sides of the stepladder are locked into place. Ask someone to hold it for you, if possible. Clearly mark and make sure that you can see: Any grab bars or handrails. First and last steps. Where the edge of each step is. Use tools that help you move around (mobility aids) if they are needed. These include: Canes. Walkers. Scooters. Crutches. Turn on the lights when you go into a dark area. Replace any light bulbs as soon as they burn out. Set up your furniture so you have a clear path. Avoid moving your furniture around. If any of your floors are uneven, fix them. If there are any pets around you, be aware of where they are. Review your medicines with your doctor. Some medicines can make you feel dizzy. This can increase your chance of falling. Ask your doctor what other things that you can do to help prevent falls. This information is not intended to replace advice given to you by your health care provider. Make sure you discuss any questions you have with your health care provider. Document Released: 12/26/2008 Document Revised: 08/07/2015 Document Reviewed: 04/05/2014 Elsevier Interactive Patient Education  2017 [employer].

## 2022-07-13 ENCOUNTER — Encounter: Payer: Medicare Other | Admitting: Physical Therapy

## 2022-07-15 ENCOUNTER — Ambulatory Visit: Payer: Medicare Other | Admitting: Physical Therapy

## 2022-07-15 ENCOUNTER — Encounter: Payer: Self-pay | Admitting: Anesthesiology

## 2022-07-15 ENCOUNTER — Ambulatory Visit: Payer: Medicare Other | Attending: Anesthesiology | Admitting: Anesthesiology

## 2022-07-15 DIAGNOSIS — M5412 Radiculopathy, cervical region: Secondary | ICD-10-CM

## 2022-07-15 DIAGNOSIS — M545 Low back pain, unspecified: Secondary | ICD-10-CM

## 2022-07-15 DIAGNOSIS — M47816 Spondylosis without myelopathy or radiculopathy, lumbar region: Secondary | ICD-10-CM

## 2022-07-15 DIAGNOSIS — G894 Chronic pain syndrome: Secondary | ICD-10-CM | POA: Diagnosis not present

## 2022-07-15 DIAGNOSIS — M25511 Pain in right shoulder: Secondary | ICD-10-CM

## 2022-07-15 DIAGNOSIS — M4722 Other spondylosis with radiculopathy, cervical region: Secondary | ICD-10-CM

## 2022-07-15 DIAGNOSIS — M797 Fibromyalgia: Secondary | ICD-10-CM

## 2022-07-15 DIAGNOSIS — M47812 Spondylosis without myelopathy or radiculopathy, cervical region: Secondary | ICD-10-CM

## 2022-07-15 DIAGNOSIS — M5136 Other intervertebral disc degeneration, lumbar region: Secondary | ICD-10-CM

## 2022-07-15 DIAGNOSIS — M5432 Sciatica, left side: Secondary | ICD-10-CM

## 2022-07-15 DIAGNOSIS — F1190 Opioid use, unspecified, uncomplicated: Secondary | ICD-10-CM

## 2022-07-15 DIAGNOSIS — M542 Cervicalgia: Secondary | ICD-10-CM

## 2022-07-15 DIAGNOSIS — G8929 Other chronic pain: Secondary | ICD-10-CM

## 2022-07-15 DIAGNOSIS — M5442 Lumbago with sciatica, left side: Secondary | ICD-10-CM

## 2022-07-15 DIAGNOSIS — M25512 Pain in left shoulder: Secondary | ICD-10-CM

## 2022-07-15 DIAGNOSIS — M5441 Lumbago with sciatica, right side: Secondary | ICD-10-CM

## 2022-07-15 NOTE — Progress Notes (Signed)
Virtual Visit via Telephone Note I connected with Carrie Myers on 07/15/22 at 11:20 AM EDT by telephone and verified that I am speaking with the correct person using two identifiers.  Location: Patient: Home Provider: Pain control center   I discussed the limitations, risks, security and privacy concerns of performing an evaluation and management service by telephone and the availability of in person appointments. I also discussed with the patient that there may be a patient responsible charge related to this service. The patient expressed understanding and agreed to proceed.   History of Present Illness:  I spoke with Carrie Myers today via telephone as we were unable like for the video portion conference.  She recently had an epidural approximately 1 month ago and did get good relief rated about 75% improvement with her low back and 80 to 100% relief of her sciatica.  With some recent increase in activity and cooking she reports that her back pain has been slightly worse over the past several days but she attributes this to the activity.  The quality characteristic and distribution of her back pain are stable.  She takes hydrocodone about once a day to every other day on average to help with pain relief when she has it.  She uses her Valium on a nightly basis to help with sleep and muscle spasms and this combination is working well and without side effect.  She has failed more conservative therapy.  She tries to stay active and do her stretching exercises.  Her dog passed this morning and she had the recent death of a friend which has caused some problems for her emotionally but she feels that she is coping reasonably well.  No side effects with her medications are reported. Review of systems: General: No fevers or chills Pulmonary: No shortness of breath or dyspnea Cardiac: No angina or palpitations or lightheadedness GI: No abdominal pain or constipation Psych: No depression    Observations/Objective:  Current Outpatient Medications:    Armodafinil 150 MG tablet, Take 150 mg by mouth daily. For fibromyalgia fatigue, Disp: , Rfl:    aspirin EC 81 MG tablet, Take 81 mg by mouth daily., Disp: , Rfl:    clobetasol ointment (TEMOVATE) 0.05 %, Apply topically., Disp: , Rfl:    clotrimazole-betamethasone (LOTRISONE) cream, Apply externally BID for 2 wks, Disp: 45 g, Rfl: 0   diazepam (VALIUM) 5 MG tablet, Take 1 tablet (5 mg total) by mouth at bedtime as needed for anxiety., Disp: 30 tablet, Rfl: 5   Diclofenac-miSOPROStol 75-0.2 MG TBEC, TAKE (1) TABLET BY MOUTH TWICE DAILY, Disp: 60 tablet, Rfl: 5   DULoxetine (CYMBALTA) 30 MG capsule, Take 3 capsules (90 mg total) by mouth daily., Disp: 270 capsule, Rfl: 0   fluticasone-salmeterol (ADVAIR HFA) 115-21 MCG/ACT inhaler, Inhale into the lungs 2 (two) times daily., Disp: , Rfl:    HYDROcodone-acetaminophen (NORCO/VICODIN) 5-325 MG tablet, Take 1 tablet by mouth 2 (two) times daily., Disp: 45 tablet, Rfl: 0   HYDROcodone-acetaminophen (NORCO/VICODIN) 5-325 MG tablet, Take 1 tablet by mouth 2 (two) times daily., Disp: 45 tablet, Rfl: 0   HYDROcodone-acetaminophen (NORCO/VICODIN) 5-325 MG tablet, Take 1 tablet by mouth 2 (two) times daily as needed for moderate pain., Disp: 45 tablet, Rfl: 0   levocetirizine (XYZAL) 5 MG tablet, Take by mouth., Disp: , Rfl:    levothyroxine (SYNTHROID) 88 MCG tablet, TAKE 1 TABLET BY MOUTH DAILY, Disp: 90 tablet, Rfl: 2   Loperamide HCl (IMODIUM PO), Take 1 tablet by  mouth as needed., Disp: , Rfl:    losartan (COZAAR) 100 MG tablet, Take 100 mg by mouth daily., Disp: , Rfl:    Multiple Vitamin (MULTI-VITAMINS) TABS, Take by mouth., Disp: , Rfl:    NON FORMULARY, at bedtime. CPAP @ 11 cm H2O with Oxygen, Disp: , Rfl:    pantoprazole (PROTONIX) 40 MG tablet, Take 1 tablet by mouth 2 (two) times daily., Disp: , Rfl:    pravastatin (PRAVACHOL) 80 MG tablet, Take 80 mg by mouth daily., Disp: , Rfl:     pregabalin (LYRICA) 50 MG capsule, Take 50 mg by mouth 2 (two) times daily., Disp: , Rfl:    sucralfate (CARAFATE) 1 GM/10ML suspension, Take 10 mLs (1 g total) by mouth 4 (four) times daily -  with meals and at bedtime., Disp: 420 mL, Rfl: 0   Past Medical History:  Diagnosis Date   Allergy    Anxiety    Arthritis    neck, hands, lower back   Bilateral dry eyes    Chronic low back pain 12/23/2016   Coronary artery disease    DDD (degenerative disc disease), lumbar 10/02/2014   Dental crowns present    Implants, front "flipper" while waiting for other implants   Emphysema of lung (HCC)    Fibromyalgia    Fibromyalgia    Fracture of right foot 01/14/2015   GERD (gastroesophageal reflux disease)    History of Graves' disease    Hyperlipidemia    Hypertension    Hypothyroidism    Myocardial infarction (HCC) 04/22/2006   Osteoarthritis    Other shoulder lesions, right shoulder 04/17/2015   Rheumatoid arthritis (HCC)    Sciatica, left side 01/01/2019   Sleep apnea    CPAP   Status post shoulder surgery 05/27/2015   Thyroid disease    Vertigo    none for approx 9 yrs     Assessment and Plan: 1. Chronic, continuous use of opioids   2. Chronic bilateral low back pain without sciatica   3. Fibromyalgia   4. Facet syndrome, lumbar   5. Chronic pain syndrome   6. Chronic bilateral low back pain with bilateral sciatica   7. DDD (degenerative disc disease), lumbar   8. Sciatica, left side   9. Pain of both shoulder joints   10. Cervical radiculitis   11. Spondylosis of lumbar region without myelopathy or radiculopathy   12. Cervicalgia   13. Cervical facet joint syndrome   Based on our conversation I think is appropriate to refill her medicines for the next 2 months.  She does not need or want a repeat refill on the hydrocodone at this point as she has not utilized what she currently has.  She will be due for her Vicodin soon.  I want her to continue with her stretching  strengthening exercises.  Continue follow-up with her primary care physicians and we will reach out to her again in 2 months.  Follow Up Instructions:    I discussed the assessment and treatment plan with the patient. The patient was provided an opportunity to ask questions and all were answered. The patient agreed with the plan and demonstrated an understanding of the instructions.   The patient was advised to call back or seek an in-person evaluation if the symptoms worsen or if the condition fails to improve as anticipated.  I provided of non-face-to-face time during this encounter.   Yevette Edwards, MD

## 2022-07-27 DIAGNOSIS — J9611 Chronic respiratory failure with hypoxia: Secondary | ICD-10-CM | POA: Diagnosis not present

## 2022-07-27 DIAGNOSIS — I10 Essential (primary) hypertension: Secondary | ICD-10-CM | POA: Diagnosis not present

## 2022-07-27 DIAGNOSIS — I25118 Atherosclerotic heart disease of native coronary artery with other forms of angina pectoris: Secondary | ICD-10-CM | POA: Diagnosis not present

## 2022-07-27 DIAGNOSIS — R0602 Shortness of breath: Secondary | ICD-10-CM | POA: Diagnosis not present

## 2022-08-04 ENCOUNTER — Other Ambulatory Visit: Payer: Self-pay | Admitting: Internal Medicine

## 2022-08-04 DIAGNOSIS — F331 Major depressive disorder, recurrent, moderate: Secondary | ICD-10-CM

## 2022-08-17 ENCOUNTER — Ambulatory Visit: Payer: Medicare Other | Admitting: Certified Registered"

## 2022-08-17 ENCOUNTER — Other Ambulatory Visit: Payer: Self-pay

## 2022-08-17 ENCOUNTER — Encounter: Payer: Self-pay | Admitting: *Deleted

## 2022-08-17 ENCOUNTER — Ambulatory Visit
Admission: RE | Admit: 2022-08-17 | Discharge: 2022-08-17 | Disposition: A | Discharge status: Home / Self Care | Payer: Medicare Other | Attending: Gastroenterology | Admitting: Gastroenterology

## 2022-08-17 ENCOUNTER — Encounter
Admission: RE | Disposition: A | Discharge status: Home / Self Care | Payer: Self-pay | Source: Home / Self Care | Attending: Gastroenterology

## 2022-08-17 DIAGNOSIS — K219 Gastro-esophageal reflux disease without esophagitis: Secondary | ICD-10-CM | POA: Diagnosis not present

## 2022-08-17 DIAGNOSIS — Z79891 Long term (current) use of opiate analgesic: Secondary | ICD-10-CM | POA: Diagnosis not present

## 2022-08-17 DIAGNOSIS — R053 Chronic cough: Secondary | ICD-10-CM | POA: Insufficient documentation

## 2022-08-17 DIAGNOSIS — K2100 Gastro-esophageal reflux disease with esophagitis, without bleeding: Secondary | ICD-10-CM | POA: Diagnosis not present

## 2022-08-17 DIAGNOSIS — I10 Essential (primary) hypertension: Secondary | ICD-10-CM | POA: Diagnosis not present

## 2022-08-17 DIAGNOSIS — K449 Diaphragmatic hernia without obstruction or gangrene: Secondary | ICD-10-CM | POA: Insufficient documentation

## 2022-08-17 DIAGNOSIS — I2510 Atherosclerotic heart disease of native coronary artery without angina pectoris: Secondary | ICD-10-CM | POA: Diagnosis not present

## 2022-08-17 DIAGNOSIS — Z87891 Personal history of nicotine dependence: Secondary | ICD-10-CM | POA: Diagnosis not present

## 2022-08-17 HISTORY — PX: ESOPHAGOGASTRODUODENOSCOPY (EGD) WITH PROPOFOL: SHX5813

## 2022-08-17 SURGERY — ESOPHAGOGASTRODUODENOSCOPY (EGD) WITH PROPOFOL
Anesthesia: General

## 2022-08-17 MED ORDER — SODIUM CHLORIDE 0.9 % IV SOLN: INTRAVENOUS | Status: DC

## 2022-08-17 MED ORDER — PROPOFOL 500 MG/50ML IV EMUL
INTRAVENOUS | Status: DC | PRN
  Administered 2022-08-17: 150 ug/kg/min via INTRAVENOUS
  Administered 2022-08-17: 50 mg via INTRAVENOUS

## 2022-08-17 MED ORDER — GLYCOPYRROLATE 0.2 MG/ML IJ SOLN
INTRAMUSCULAR | Status: DC | PRN
  Administered 2022-08-17: .2 mg via INTRAVENOUS

## 2022-08-17 MED ORDER — LIDOCAINE HCL (CARDIAC) PF 100 MG/5ML IV SOSY
PREFILLED_SYRINGE | INTRAVENOUS | Status: DC | PRN
  Administered 2022-08-17: 20 mg via INTRAVENOUS

## 2022-08-17 MED ORDER — SODIUM CHLORIDE 0.9 % IV SOLN: INTRAVENOUS | Status: DC | PRN

## 2022-08-17 MED ORDER — ONDANSETRON HCL 4 MG/2ML IJ SOLN
INTRAMUSCULAR | Status: DC | PRN
  Administered 2022-08-17: 4 mg via INTRAVENOUS

## 2022-08-17 NOTE — Anesthesia Preprocedure Evaluation (Signed)
Anesthesia Evaluation  Patient identified by MRN, date of birth, ID band Patient awake    Reviewed: Allergy & Precautions, NPO status , Patient's Chart, lab work & pertinent test results  History of Anesthesia Complications Negative for: history of anesthetic complications  Airway Mallampati: II  TM Distance: <3 FB Neck ROM: Full    Dental no notable dental hx. (+) Teeth Intact   Pulmonary sleep apnea and Continuous Positive Airway Pressure Ventilation , COPD,  COPD inhaler, Patient abstained from smoking.Not current smoker, former smoker   Pulmonary exam normal breath sounds clear to auscultation       Cardiovascular Exercise Tolerance: Good METS: 3 - Mets hypertension, + CAD (Ax- denies anginia;  Has 1 stent 14 yrs ago;  Neg recent EST) and + Past MI  Normal cardiovascular exam(-) dysrhythmias  Rhythm:Regular Rate:Normal     Neuro/Psych  PSYCHIATRIC DISORDERS Anxiety Depression     Neuromuscular disease    GI/Hepatic Neg liver ROS,GERD (rule out for chronic cough-  no "heartburn")  Medicated,,  Endo/Other  neg diabetesHypothyroidism    Renal/GU negative Renal ROS  negative genitourinary   Musculoskeletal  (+) Arthritis ,  Fibromyalgia -  Abdominal   Peds  Hematology negative hematology ROS (+)   Anesthesia Other Findings Past Medical History: No date: Allergy No date: Anxiety No date: Arthritis     Comment:  neck, hands, lower back No date: Bilateral dry eyes 12/23/2016: Chronic low back pain No date: Coronary artery disease 10/02/2014: DDD (degenerative disc disease), lumbar No date: Dental crowns present     Comment:  Implants, front "flipper" while waiting for other               implants No date: Emphysema of lung (HCC) No date: Fibromyalgia No date: Fibromyalgia 01/14/2015: Fracture of right foot No date: GERD (gastroesophageal reflux disease) No date: History of Graves' disease No date:  Hyperlipidemia No date: Hypertension No date: Hypothyroidism 04/22/2006: Myocardial infarction [facility]) No date: Osteoarthritis 04/17/2015: Other shoulder lesions, right shoulder No date: Rheumatoid arthritis (HCC) 01/01/2019: Sciatica, left side No date: Sleep apnea     Comment:  CPAP 05/27/2015: Status post shoulder surgery No date: Thyroid disease No date: Vertigo     Comment:  none for approx 9 yrs  Reproductive/Obstetrics negative OB ROS                             Anesthesia Physical Anesthesia Plan  ASA: 3  Anesthesia Plan: General   Post-op Pain Management: Minimal or no pain anticipated   Induction: Intravenous  PONV Risk Score and Plan: 3 and Propofol infusion, TIVA and Ondansetron  Airway Management Planned: Natural Airway and Nasal Cannula  Additional Equipment: None  Intra-op Plan:   Post-operative Plan:   Informed Consent: I have reviewed the patients History and Physical, chart, labs and discussed the procedure including the risks, benefits and alternatives for the proposed anesthesia with the patient or authorized representative who has indicated his/her understanding and acceptance.     Dental Advisory Given  Plan Discussed with: Anesthesiologist, CRNA and Surgeon  Anesthesia Plan Comments: (Discussed risks of anesthesia with patient, including possibility of difficulty with spontaneous ventilation under anesthesia necessitating airway intervention, PONV, and rare risks such as cardiac or respiratory or neurological events, and allergic reactions. Discussed the role of CRNA in patient's perioperative care. Patient understands.)        Anesthesia Quick Evaluation

## 2022-08-17 NOTE — Anesthesia Postprocedure Evaluation (Signed)
Anesthesia Post Note  Patient: Carrie Myers  Procedure(s) Performed: ESOPHAGOGASTRODUODENOSCOPY (EGD) WITH PROPOFOL  Patient location during evaluation: Endoscopy Anesthesia Type: General Level of consciousness: awake and alert Pain management: pain level controlled Vital Signs Assessment: post-procedure vital signs reviewed and stable Respiratory status: spontaneous breathing, nonlabored ventilation, respiratory function stable and patient connected to nasal cannula oxygen Cardiovascular status: blood pressure returned to baseline and stable Postop Assessment: no apparent nausea or vomiting Anesthetic complications: no   No notable events documented.   Last Vitals:  Vitals:   08/17/22 0758 08/17/22 0838  BP: 133/79 (!) 151/85  Pulse: 71   Resp: 18   Temp: (!) 36 C (!) 35.7 C  SpO2: 100%     Last Pain:  Vitals:   08/17/22 0848  TempSrc:   PainSc: 0-No pain                 Corinda Gubler

## 2022-08-17 NOTE — Transfer of Care (Signed)
Immediate Anesthesia Transfer of Care Note  Patient: Carrie Myers  Procedure(s) Performed: ESOPHAGOGASTRODUODENOSCOPY (EGD) WITH PROPOFOL  Patient Location: PACU  Anesthesia Type:MAC  Level of Consciousness: drowsy  Airway & Oxygen Therapy: Patient Spontanous Breathing  Post-op Assessment: Report given to RN and Post -op Vital signs reviewed and stable  Post vital signs: Reviewed  Last Vitals:  Vitals Value Taken Time  BP 151/85 08/17/22 0838  Temp 35.7 C 08/17/22 0838  Pulse 79 08/17/22 0839  Resp 19 08/17/22 0839  SpO2 93 % 08/17/22 0839  Vitals shown include unvalidated device data.  Last Pain:  Vitals:   08/17/22 0838  TempSrc: Temporal  PainSc: Asleep         Complications: No notable events documented.

## 2022-08-17 NOTE — Op Note (Signed)
[facility] Patient Name: Carrie Myers Procedure Date: 08/17/2022 8:16 AM MRN: [id number] Account #: [id number] Date of Birth: [birth year] Admit Type: Outpatient Age: 77 Room: [facility] ENDO ROOM 1 Gender: Female Note Status: Finalized Instrument Name: Laurette Schimke [id number] Procedure:             Upper GI endoscopy Indications:           Gastro-esophageal reflux disease, Chronic cough Providers:             Eather Colas MD, MD Referring MD:          Bari Edward, MD (Referring MD) Medicines:             Monitored Anesthesia Care Complications:         No immediate complications. Procedure:             Pre-Anesthesia Assessment:                        - Prior to the procedure, a History and Physical was                         performed, and patient medications and allergies were                         reviewed. The patient is competent. The risks and                         benefits of the procedure and the sedation options and                         risks were discussed with the patient. All questions                         were answered and informed consent was obtained.                         Patient identification and proposed procedure were                         verified by the physician, the nurse, the                         anesthesiologist, the anesthetist and the technician                         in the endoscopy suite. Mental Status Examination:                         alert and oriented. Airway Examination: normal                         oropharyngeal airway and neck mobility. Respiratory                         Examination: clear to auscultation. CV Examination:                         normal. Prophylactic Antibiotics: The patient does not  require prophylactic antibiotics. Prior                         Anticoagulants: The patient has taken no anticoagulant                         or antiplatelet  agents except for aspirin. ASA Grade                         Assessment: III - A patient with severe systemic                         disease. After reviewing the risks and benefits, the                         patient was deemed in satisfactory condition to                         undergo the procedure. The anesthesia plan was to use                         monitored anesthesia care (MAC). Immediately prior to                         administration of medications, the patient was                         re-assessed for adequacy to receive sedatives. The                         heart rate, respiratory rate, oxygen saturations,                         blood pressure, adequacy of pulmonary ventilation, and                         response to care were monitored throughout the                         procedure. The physical status of the patient was                         re-assessed after the procedure.                        After obtaining informed consent, the endoscope was                         passed under direct vision. Throughout the procedure,                         the patient's blood pressure, pulse, and oxygen                         saturations were monitored continuously. The Endoscope                         was introduced through the mouth, and advanced to the  second part of duodenum. The upper GI endoscopy was                         accomplished without difficulty. The patient tolerated                         the procedure well. Findings:      A small hiatal hernia was present.      The exam of the esophagus was otherwise normal.      Bilious fluid was found in the gastric fundus, in the gastric body and       in the gastric antrum.      The exam of the stomach was otherwise normal.      The examined duodenum was normal. Impression:            - Small hiatal hernia.                        - Bilious gastric fluid.                        -  Normal examined duodenum.                        - No specimens collected. Recommendation:        - Discharge patient to home.                        - Resume previous diet.                        - Continue present medications.                        - Perform ambulatory esophageal manometry if symptoms                         persist.                        - Perform ambulatory pH and impedance monitoring if                         symptoms persist.                        - Return to referring physician as previously                         scheduled. Would consider EGJOO due to opioids and/or                         gastroparesis due to opioids leading to reflux.                         Regardless, to rule out a GI cause of chronic cough, a                         manometry with impedance on max dose PPI would need to  be performed. Procedure Code(s):     --- Professional ---                        [phone], Esophagogastroduodenoscopy, flexible,                         transoral; diagnostic, including collection of                         specimen(s) by brushing or washing, when performed                         (separate procedure) Diagnosis Code(s):     --- Professional ---                        K44.9, Diaphragmatic hernia without obstruction or                         gangrene                        K21.9, Gastro-esophageal reflux disease without                         esophagitis                        R05.3, Chronic cough CPT copyright 2022 American Medical Association. All rights reserved. The codes documented in this report are preliminary and upon coder review may  be revised to meet current compliance requirements. Eather Colas MD, MD 08/17/2022 8:40:39 AM Number of Addenda: 0 Note Initiated On: 08/17/2022 8:16 AM Estimated Blood Loss:  Estimated blood loss: none.      [facility]

## 2022-08-17 NOTE — Interval H&P Note (Signed)
History and Physical Interval Note:  08/17/2022 8:20 AM  Carrie Myers  has presented today for surgery, with the diagnosis of 530.81 (ICD-9-CM) - K21.9 (ICD-10-CM) - Gastroesophageal reflux disease, unspecified whether esophagitis present.  The various methods of treatment have been discussed with the patient and family. After consideration of risks, benefits and other options for treatment, the patient has consented to  Procedure(s): ESOPHAGOGASTRODUODENOSCOPY (EGD) WITH PROPOFOL (N/A) as a surgical intervention.  The patient's history has been reviewed, patient examined, no change in status, stable for surgery.  I have reviewed the patient's chart and labs.  Questions were answered to the patient's satisfaction.     Regis Bill  Ok to proceed with EGD

## 2022-08-17 NOTE — H&P (Signed)
Outpatient short stay form Pre-procedure 08/17/2022  Regis Bill, MD  Primary Physician: [name], MD  Reason for visit:  Chronic cough  History of present illness:    77 y/o lady with history of COPD, hypothyroidism, and chronic pain here for EGD for chronic cough/GERD. No blood thinners. No neck surgeries. History of appendectomy and c-section. Takes chronic opioids for fibromyalgia.    Current Facility-Administered Medications:    0.9 %  sodium chloride infusion, , Intravenous, Continuous, Davian Wollenberg, Rossie Muskrat, MD, Last Rate: 20 mL/hr at 08/17/22 0811, New Bag at 08/17/22 0811  Facility-Administered Medications Ordered in Other Encounters:    0.9 %  sodium chloride infusion, , Intravenous, Continuous PRN, Merlene Pulling, CRNA, New Bag at 08/17/22 0730  Medications Prior to Admission  Medication Sig Dispense Refill Last Dose   Armodafinil 150 MG tablet Take 150 mg by mouth daily. For fibromyalgia fatigue   08/16/2022   aspirin EC 81 MG tablet Take 81 mg by mouth daily.   08/16/2022   diazepam (VALIUM) 5 MG tablet Take 1 tablet (5 mg total) by mouth at bedtime as needed for anxiety. 30 tablet 5 08/16/2022   Diclofenac-miSOPROStol 75-0.2 MG TBEC TAKE (1) TABLET BY MOUTH TWICE DAILY 60 tablet 5 08/16/2022   DULoxetine (CYMBALTA) 30 MG capsule Take 3 capsules (90 mg total) by mouth daily. 270 capsule 0 08/16/2022   DULoxetine (CYMBALTA) 60 MG capsule TAKE 1 CAPSULE BY MOUTH DAILY 90 capsule 3 08/16/2022   levothyroxine (SYNTHROID) 88 MCG tablet TAKE 1 TABLET BY MOUTH DAILY 90 tablet 2 08/16/2022   losartan (COZAAR) 100 MG tablet Take 100 mg by mouth daily.   08/16/2022   Multiple Vitamin (MULTI-VITAMINS) TABS Take by mouth.   08/16/2022   pantoprazole (PROTONIX) 40 MG tablet Take 1 tablet by mouth 2 (two) times daily.   08/16/2022   pravastatin (PRAVACHOL) 80 MG tablet Take 80 mg by mouth daily.   08/16/2022   pregabalin (LYRICA) 50 MG capsule Take 50 mg by mouth 2 (two) times daily.    08/16/2022   sucralfate (CARAFATE) 1 GM/10ML suspension Take 10 mLs (1 g total) by mouth 4 (four) times daily -  with meals and at bedtime. 420 mL 0 08/16/2022   clobetasol ointment (TEMOVATE) 0.05 % Apply topically.      clotrimazole-betamethasone (LOTRISONE) cream Apply externally BID for 2 wks 45 g 0    fluticasone-salmeterol (ADVAIR HFA) 115-21 MCG/ACT inhaler Inhale into the lungs 2 (two) times daily.      HYDROcodone-acetaminophen (NORCO/VICODIN) 5-325 MG tablet Take 1 tablet by mouth 2 (two) times daily. 45 tablet 0    HYDROcodone-acetaminophen (NORCO/VICODIN) 5-325 MG tablet Take 1 tablet by mouth 2 (two) times daily. 45 tablet 0    HYDROcodone-acetaminophen (NORCO/VICODIN) 5-325 MG tablet Take 1 tablet by mouth 2 (two) times daily as needed for moderate pain. 45 tablet 0    levocetirizine (XYZAL) 5 MG tablet Take by mouth.      Loperamide HCl (IMODIUM PO) Take 1 tablet by mouth as needed.      NON FORMULARY at bedtime. CPAP @ 11 cm H2O with Oxygen        Allergies  Allergen Reactions   Amoxicillin-Pot Clavulanate Nausea And Vomiting and Other (See Comments)   Codeine Nausea And Vomiting and Other (See Comments)   Sulfa Antibiotics Nausea And Vomiting   Proparacaine Itching   Shellfish Allergy Nausea And Vomiting    No issues with iodine      Past Medical  History:  Diagnosis Date   Allergy    Anxiety    Arthritis    neck, hands, lower back   Bilateral dry eyes    Chronic low back pain 12/23/2016   Coronary artery disease    DDD (degenerative disc disease), lumbar 10/02/2014   Dental crowns present    Implants, front "flipper" while waiting for other implants   Emphysema of lung (HCC)    Fibromyalgia    Fibromyalgia    Fracture of right foot 01/14/2015   GERD (gastroesophageal reflux disease)    History of Graves' disease    Hyperlipidemia    Hypertension    Hypothyroidism    Myocardial infarction (HCC) 04/22/2006   Osteoarthritis    Other shoulder lesions, right  shoulder 04/17/2015   Rheumatoid arthritis (HCC)    Sciatica, left side 01/01/2019   Sleep apnea    CPAP   Status post shoulder surgery 05/27/2015   Thyroid disease    Vertigo    none for approx 9 yrs    Review of systems:  Otherwise negative.    Physical Exam  Gen: Alert, oriented. Appears stated age.  HEENT: PERRLA. Lungs: No respiratory distress CV: RRR Abd: soft, benign, no masses Ext: No edema    Planned procedures: Proceed with EGD. The patient understands the nature of the planned procedure, indications, risks, alternatives and potential complications including but not limited to bleeding, infection, perforation, damage to internal organs and possible oversedation/side effects from anesthesia. The patient agrees and gives consent to proceed.  Please refer to procedure notes for findings, recommendations and patient disposition/instructions.     Regis Bill, MD [facility]

## 2022-08-18 ENCOUNTER — Ambulatory Visit: Payer: Medicare Other | Admitting: Internal Medicine

## 2022-08-18 ENCOUNTER — Encounter: Payer: Self-pay | Admitting: Gastroenterology

## 2022-08-18 NOTE — Progress Notes (Deleted)
Date:  08/18/2022   Name:  Carrie Myers   DOB:  January 21, 1946   MRN:  [id number]   Chief Complaint: No chief complaint on file.  Depression        This is a chronic problem.The problem is unchanged.  Past treatments include SNRIs - Serotonin and norepinephrine reuptake inhibitors.   Lab Results  Component Value Date   NA 140 04/21/2022   K 4.4 04/21/2022   CO2 24 04/21/2022   GLUCOSE 94 04/21/2022   BUN 21 04/21/2022   CREATININE 0.87 04/21/2022   CALCIUM 9.2 04/21/2022   EGFR 70 04/21/2022   GFRNONAA 57 (L) 03/28/2020   Lab Results  Component Value Date   CHOL 140 04/21/2022   HDL 55 04/21/2022   LDLCALC 64 04/21/2022   TRIG 120 04/21/2022   CHOLHDL 2.5 04/21/2022   Lab Results  Component Value Date   TSH 1.880 04/21/2022   Lab Results  Component Value Date   HGBA1C 6.3 (H) 04/21/2022   Lab Results  Component Value Date   WBC 5.3 04/21/2022   HGB 12.2 04/21/2022   HCT 37.7 04/21/2022   MCV 89 04/21/2022   PLT 302 04/21/2022   Lab Results  Component Value Date   ALT 26 04/21/2022   AST 28 04/21/2022   ALKPHOS 82 04/21/2022   BILITOT 0.3 04/21/2022   No results found for: "25OHVITD2", "25OHVITD3", "VD25OH"   Review of Systems  Psychiatric/Behavioral:  Positive for depression.     Patient Active Problem List   Diagnosis Date Noted   Arthralgia of left ankle 10/20/2021   Osteopenia determined by x-ray 03/31/2021   Primary osteoarthritis of left knee 01/22/2021   Effusion, right knee 12/15/2020   Primary osteoarthritis of right knee 11/24/2020   Overactive bladder 06/18/2020   Prediabetes 04/14/2020   Slow transit constipation 06/05/2019   Sciatica, left side 01/01/2019   Diverticulosis of colon 04/25/2018   Change in bowel habits    Tobacco use disorder, moderate, in sustained remission 12/22/2017   Moderate episode of recurrent major depressive disorder (HCC) 10/03/2017   Inflammatory spondylopathy of lumbar region (HCC) 10/03/2017    Hoarseness, persistent 04/18/2017   OSA on CPAP 02/18/2017   Cervical disc disease 12/31/2016   Generalized osteoarthritis 12/23/2016   Chronic pain syndrome 12/23/2016   Cystocele, midline 12/06/2016   Urinary incontinence 12/06/2016   Impingement syndrome of right shoulder 04/17/2015   Fibrocystic breast 01/18/2015   Gastroesophageal reflux disease without esophagitis 01/18/2015   Hypothyroidism, postablative 01/18/2015   Arteriosclerosis of coronary artery 12/29/2014   DJD of shoulder 11/13/2014   Fibromyalgia 10/02/2014   Intercostal neuralgia 10/02/2014   Facet syndrome, lumbar 10/02/2014   Sacroiliac joint disease 10/02/2014   Carotid artery narrowing 07/09/2014   Benign essential HTN 07/01/2014   Osteoarthritis of thumb 09/24/2012   Mixed hyperlipidemia 09/22/2012   Arthritis of hand, degenerative 09/22/2012    Allergies  Allergen Reactions   Amoxicillin-Pot Clavulanate Nausea And Vomiting and Other (See Comments)   Codeine Nausea And Vomiting and Other (See Comments)   Sulfa Antibiotics Nausea And Vomiting   Proparacaine Itching   Shellfish Allergy Nausea And Vomiting    No issues with iodine     Past Surgical History:  Procedure Laterality Date   ABDOMINAL HYSTERECTOMY     total   APPENDECTOMY     BREAST BIOPSY Right    neg   BREAST CYST ASPIRATION Left    neg   BREAST EXCISIONAL BIOPSY Right  BUNIONECTOMY Bilateral    CESAREAN SECTION     COLONOSCOPY  2010   normal   COLONOSCOPY WITH PROPOFOL N/A 04/03/2018   Procedure: COLONOSCOPY WITH BIOPSIES;  Surgeon: Midge Minium, MD;  Location: [facility];  Service: Endoscopy;  Laterality: N/A;  sleep apnea   CORONARY ANGIOPLASTY WITH STENT PLACEMENT  04/2006   dental implant     [age]   DILATION AND CURETTAGE OF UTERUS     epidural steroid injection  2018   ESOPHAGOGASTRODUODENOSCOPY N/A 10/03/2020   Procedure: ESOPHAGOGASTRODUODENOSCOPY (EGD);  Surgeon: Regis Bill, MD;  Location[facility]  ENDOSCOPY;  Service: Endoscopy;  Laterality: N/A;  REQUESTING MORNING AFTER 9 AM   ESOPHAGOGASTRODUODENOSCOPY (EGD) WITH PROPOFOL N/A 08/17/2022   Procedure: ESOPHAGOGASTRODUODENOSCOPY (EGD) WITH PROPOFOL;  Surgeon: Regis Bill, MD;  Location: [facility] ENDOSCOPY;  Service: Endoscopy;  Laterality: N/A;   EYE SURGERY     bilateral cataract extraction   SHOULDER ARTHROSCOPY Right 05/07/2015   Procedure: Extensive arthroscopic debridement and arthroscopic subacromial decompression, right shoulder.                        2.  Steroid injection right thumb CMC joint.;  Surgeon: Christena Flake, MD;  Location: [facility];  Service: Orthopedics;  Laterality: Right;   TOOTH EXTRACTION     Upper centrals and lateral    Social History   Tobacco Use   Smoking status: Former    Packs/day: 1.25    Years: 44.00    Additional pack years: 0.00    Total pack years: 55.00    Types: Cigarettes    Quit date: 04/22/2006    Years since quitting: 16.3   Smokeless tobacco: Never   Tobacco comments:       Vaping Use   Vaping Use: Never used  Substance Use Topics   Alcohol use: Not Currently   Drug use: Never     Medication list has been reviewed and updated.  No outpatient medications have been marked as taking for the 08/18/22 encounter (Appointment) with [name], MD.       05/21/2022    3:42 PM 04/21/2022   10:59 AM 02/23/2022    1:59 PM 10/20/2021    3:44 PM  GAD 7 : Generalized Anxiety Score  Nervous, Anxious, on Edge 0 3 0 0  Control/stop worrying 0 3 0 0  Worry too much - different things 0 3 0 0  Trouble relaxing 0 0 0 0  Restless 0 0 0 0  Easily annoyed or irritable 1 2 0 0  Afraid - awful might happen 0 0 0 0  Total GAD 7 Score 1 11 0 0  Anxiety Difficulty Not difficult at all Very difficult Not difficult at all Not difficult at all       07/08/2022    9:34 AM 06/21/2022    2:45 PM 05/21/2022    3:42 PM  Depression screen PHQ 2/9  Decreased Interest 0 0 0  Down,  Depressed, Hopeless 0 0 0  PHQ - 2 Score 0 0 0  Altered sleeping 0  0  Tired, decreased energy 0  1  Change in appetite 0  0  Feeling bad or failure about yourself  0  0  Trouble concentrating 0  0  Moving slowly or fidgety/restless 0  0  Suicidal thoughts 0  0  PHQ-9 Score 0  1  Difficult doing work/chores Not difficult at all  Not difficult  at all    BP Readings from Last 3 Encounters:  08/17/22 (!) 151/85  06/21/22 125/60  05/21/22 128/78    Physical Exam  Wt Readings from Last 3 Encounters:  08/17/22 176 lb (79.8 kg)  07/08/22 177 lb (80.3 kg)  06/21/22 180 lb (81.6 kg)    There were no vitals taken for this visit.  Assessment and Plan:  Problem List Items Addressed This Visit   None   No follow-ups on file.   Partially dictated using Dragon software, any errors are not intentional.  [name], MD [facility] [place], [place]

## 2022-08-23 ENCOUNTER — Encounter: Payer: Self-pay | Admitting: Internal Medicine

## 2022-09-07 DIAGNOSIS — K219 Gastro-esophageal reflux disease without esophagitis: Secondary | ICD-10-CM | POA: Diagnosis not present

## 2022-09-07 DIAGNOSIS — R059 Cough, unspecified: Secondary | ICD-10-CM | POA: Diagnosis not present

## 2022-09-07 DIAGNOSIS — K2289 Other specified disease of esophagus: Secondary | ICD-10-CM | POA: Diagnosis not present

## 2022-09-14 ENCOUNTER — Other Ambulatory Visit: Payer: Self-pay | Admitting: Internal Medicine

## 2022-09-14 ENCOUNTER — Other Ambulatory Visit: Payer: Self-pay | Admitting: Obstetrics and Gynecology

## 2022-09-14 DIAGNOSIS — M797 Fibromyalgia: Secondary | ICD-10-CM

## 2022-09-29 ENCOUNTER — Other Ambulatory Visit: Payer: Self-pay | Admitting: Obstetrics and Gynecology

## 2022-09-29 ENCOUNTER — Telehealth: Payer: Self-pay

## 2022-09-29 DIAGNOSIS — N898 Other specified noninflammatory disorders of vagina: Secondary | ICD-10-CM

## 2022-09-29 NOTE — Telephone Encounter (Signed)
Pt is scheduled for a f/u with ABC on July 25 at 2:35.

## 2022-09-29 NOTE — Telephone Encounter (Signed)
Pt can wait till appt

## 2022-09-29 NOTE — Telephone Encounter (Signed)
Pt calling to request refill of the ointment ABC rx'd for her; pharmacy has tried to contact us as well.  [phone]

## 2022-10-07 ENCOUNTER — Encounter: Payer: Self-pay | Admitting: Obstetrics and Gynecology

## 2022-10-07 ENCOUNTER — Ambulatory Visit (INDEPENDENT_AMBULATORY_CARE_PROVIDER_SITE_OTHER): Payer: Medicare Other | Admitting: Obstetrics and Gynecology

## 2022-10-07 VITALS — Ht 68.0 in | Wt 176.0 lb

## 2022-10-07 DIAGNOSIS — L900 Lichen sclerosus et atrophicus: Secondary | ICD-10-CM

## 2022-10-07 DIAGNOSIS — L738 Other specified follicular disorders: Secondary | ICD-10-CM | POA: Diagnosis not present

## 2022-10-07 MED ORDER — CLOBETASOL PROPIONATE 0.05 % EX OINT: TOPICAL_OINTMENT | CUTANEOUS | 1 refills | Status: DC

## 2022-10-07 NOTE — Progress Notes (Signed)
Carrie Milan, MD   Chief Complaint  Patient presents with   Follow-up    Vaginal itching and bumps    HPI:      [name] is a 77 y.o. G1P1001 whose LMP was No LMP recorded. Patient has had a hysterectomy., presents today for LS f/u from 2/24. Pt with vaginal itching, LS diagnosed on bx. Pt on clobetasol at bedtime for 4 wks, was then supposed to have f/u appt but didn't remember to do that. Sx improved, stopped clobetasol. Now treats daily for a few days when sx flare.  Notes bumps in labial area on LT side. Not sexually active.    Patient Active Problem List   Diagnosis Date Noted   Arthralgia of left ankle 10/20/2021   Osteopenia determined by x-ray 03/31/2021   Primary osteoarthritis of left knee 01/22/2021   Effusion, right knee 12/15/2020   Primary osteoarthritis of right knee 11/24/2020   Overactive bladder 06/18/2020   Prediabetes 04/14/2020   Slow transit constipation 06/05/2019   Sciatica, left side 01/01/2019   Diverticulosis of colon 04/25/2018   Change in bowel habits    Tobacco use disorder, moderate, in sustained remission 12/22/2017   Moderate episode of recurrent major depressive disorder (HCC) 10/03/2017   Inflammatory spondylopathy of lumbar region (HCC) 10/03/2017   Hoarseness, persistent 04/18/2017   OSA on CPAP 02/18/2017   Cervical disc disease 12/31/2016   Generalized osteoarthritis 12/23/2016   Chronic pain syndrome 12/23/2016   Cystocele, midline 12/06/2016   Urinary incontinence 12/06/2016   Impingement syndrome of right shoulder 04/17/2015   Fibrocystic breast 01/18/2015   Gastroesophageal reflux disease without esophagitis 01/18/2015   Hypothyroidism, postablative 01/18/2015   Arteriosclerosis of coronary artery 12/29/2014   DJD of shoulder 11/13/2014   Fibromyalgia 10/02/2014   Intercostal neuralgia 10/02/2014   Facet syndrome, lumbar 10/02/2014   Sacroiliac joint disease 10/02/2014   Carotid artery narrowing  07/09/2014   Benign essential HTN 07/01/2014   Osteoarthritis of thumb 09/24/2012   Mixed hyperlipidemia 09/22/2012   Arthritis of hand, degenerative 09/22/2012    Past Surgical History:  Procedure Laterality Date   ABDOMINAL HYSTERECTOMY     total   APPENDECTOMY     BREAST BIOPSY Right    neg   BREAST CYST ASPIRATION Left    neg   BREAST EXCISIONAL BIOPSY Right    BUNIONECTOMY Bilateral    CESAREAN SECTION     COLONOSCOPY  2010   normal   COLONOSCOPY WITH PROPOFOL N/A 04/03/2018   Procedure: COLONOSCOPY WITH BIOPSIES;  Surgeon: Midge Minium, MD;  Location: [facility];  Service: Endoscopy;  Laterality: N/A;  sleep apnea   CORONARY ANGIOPLASTY WITH STENT PLACEMENT  04/2006   dental implant     [age]   DILATION AND CURETTAGE OF UTERUS     epidural steroid injection  2018   ESOPHAGOGASTRODUODENOSCOPY N/A 10/03/2020   Procedure: ESOPHAGOGASTRODUODENOSCOPY (EGD);  Surgeon: Regis Bill, MD;  Location[facility];  Service: Endoscopy;  Laterality: N/A;  REQUESTING MORNING AFTER 9 AM   ESOPHAGOGASTRODUODENOSCOPY (EGD) WITH PROPOFOL N/A 08/17/2022   Procedure: ESOPHAGOGASTRODUODENOSCOPY (EGD) WITH PROPOFOL;  Surgeon: Regis Bill, MD;  Location: [facility] ENDOSCOPY;  Service: Endoscopy;  Laterality: N/A;   EYE SURGERY     bilateral cataract extraction   SHOULDER ARTHROSCOPY Right 05/07/2015   Procedure: Extensive arthroscopic debridement and arthroscopic subacromial decompression, right shoulder.  2.  Steroid injection right thumb CMC joint.;  Surgeon: Christena Flake, MD;  Location: [facility];  Service: Orthopedics;  Laterality: Right;   TOOTH EXTRACTION     Upper centrals and lateral    Family History  Problem Relation Age of Onset   Stroke Mother    Heart disease Father    Pancreatic cancer Sister    Diabetes Sister    Prostate cancer Brother    Breast cancer Neg Hx    Kidney cancer Neg Hx    Bladder Cancer Neg Hx      Social History   Socioeconomic History   Marital status: Widowed    Spouse name: Not on file   Number of children: 1   Years of education: 12   Highest education level: High school graduate  Occupational History   Occupation: retired   Tobacco Use   Smoking status: Former    Current packs/day: 0.00    Average packs/day: 1.3 packs/day for 44.0 years (55.0 ttl pk-yrs)    Types: Cigarettes    Start date: 04/22/1962    Quit date: 04/22/2006    Years since quitting: 16.4   Smokeless tobacco: Never   Tobacco comments:       Vaping Use   Vaping status: Never Used  Substance and Sexual Activity   Alcohol use: Not Currently   Drug use: Never   Sexual activity: Not Currently    Partners: Male    Birth control/protection: None  Other Topics Concern   Not on file  Social History Narrative   Pt lives alone    Social Determinants of Health   Financial Resource Strain: Low Risk  (07/08/2022)   Overall Financial Resource Strain (CARDIA)    Difficulty of Paying Living Expenses: Not hard at all  Food Insecurity: No Food Insecurity (07/08/2022)   Hunger Vital Sign    Worried About Running Out of Food in the Last Year: Never true    Ran Out of Food in the Last Year: Never true  Transportation Needs: No Transportation Needs (07/08/2022)   PRAPARE - Administrator, Civil Service (Medical): No    Lack of Transportation (Non-Medical): No  Physical Activity: Insufficiently Active (07/08/2022)   Exercise Vital Sign    Days of Exercise per Week: 2 days    Minutes of Exercise per Session: 20 min  Stress: No Stress Concern Present (07/08/2022)   [employer] of Occupational Health - Occupational Stress Questionnaire    Feeling of Stress : Only a little  Social Connections: Moderately Isolated (07/08/2022)   Social Connection and Isolation Panel [NHANES]    Frequency of Communication with Friends and Family: More than three times a week    Frequency of Social Gatherings with  Friends and Family: More than three times a week    Attends Religious Services: More than 4 times per year    Active Member of [employer] or Organizations: No    Attends Banker Meetings: Never    Marital Status: Widowed  Intimate Partner Violence: Not At Risk (07/08/2022)   Humiliation, Afraid, Rape, and Kick questionnaire    Fear of Current or Ex-Partner: No    Emotionally Abused: No    Physically Abused: No    Sexually Abused: No    Outpatient Medications Prior to Visit  Medication Sig Dispense Refill   albuterol (VENTOLIN HFA) 108 (90 Base) MCG/ACT inhaler Inhale into the lungs.     Armodafinil 150 MG tablet Take 150  mg by mouth daily. For fibromyalgia fatigue     aspirin EC 81 MG tablet Take 81 mg by mouth daily.     diazepam (VALIUM) 5 MG tablet Take 1 tablet (5 mg total) by mouth at bedtime as needed for anxiety. 30 tablet 5   Diclofenac-miSOPROStol 75-0.2 MG TBEC TAKE (1) TABLET BY MOUTH TWICE DAILY 60 tablet 1   DULoxetine (CYMBALTA) 30 MG capsule Take by mouth.     DULoxetine (CYMBALTA) 60 MG capsule Take by mouth.     levocetirizine (XYZAL) 5 MG tablet Take by mouth.     levothyroxine (SYNTHROID) 88 MCG tablet TAKE 1 TABLET BY MOUTH DAILY 90 tablet 2   Loperamide HCl (IMODIUM PO) Take 1 tablet by mouth as needed.     losartan (COZAAR) 100 MG tablet Take 100 mg by mouth daily.     Multiple Vitamin (MULTI-VITAMINS) TABS Take by mouth.     NON FORMULARY at bedtime. CPAP @ 11 cm H2O with Oxygen     pantoprazole (PROTONIX) 40 MG tablet Take 1 tablet by mouth 2 (two) times daily.     pravastatin (PRAVACHOL) 80 MG tablet Take 80 mg by mouth daily.     pregabalin (LYRICA) 50 MG capsule Take 50 mg by mouth 2 (two) times daily.     sucralfate (CARAFATE) 1 g tablet Take by mouth.     clobetasol ointment (TEMOVATE) 0.05 % Apply topically.     HYDROcodone-acetaminophen (NORCO/VICODIN) 5-325 MG tablet Take 1 tablet by mouth 2 (two) times daily as needed for moderate pain. 45  tablet 0   clotrimazole-betamethasone (LOTRISONE) cream Apply externally BID for 2 wks 45 g 0   DULoxetine (CYMBALTA) 30 MG capsule Take 3 capsules (90 mg total) by mouth daily. 270 capsule 0   DULoxetine (CYMBALTA) 60 MG capsule TAKE 1 CAPSULE BY MOUTH DAILY 90 capsule 3   fluticasone-salmeterol (ADVAIR HFA) 115-21 MCG/ACT inhaler Inhale into the lungs 2 (two) times daily.     HYDROcodone-acetaminophen (NORCO/VICODIN) 5-325 MG tablet Take 1 tablet by mouth 2 (two) times daily. 45 tablet 0   HYDROcodone-acetaminophen (NORCO/VICODIN) 5-325 MG tablet Take 1 tablet by mouth 2 (two) times daily. 45 tablet 0   sucralfate (CARAFATE) 1 GM/10ML suspension Take 10 mLs (1 g total) by mouth 4 (four) times daily -  with meals and at bedtime. 420 mL 0   No facility-administered medications prior to visit.      ROS:  Review of Systems  Constitutional:  Negative for fever.  Gastrointestinal:  Negative for blood in stool, constipation, diarrhea, nausea and vomiting.  Genitourinary:  Negative for dyspareunia, dysuria, flank pain, frequency, hematuria, urgency, vaginal bleeding, vaginal discharge and vaginal pain.  Musculoskeletal:  Negative for back pain.  Skin:  Negative for rash.   BREAST: No symptoms   OBJECTIVE:   Vitals:  Ht 5\' 8"  (1.727 m)   Wt 176 lb (79.8 kg)   BMI 26.76 kg/m   Physical Exam Vitals reviewed.  Constitutional:      Appearance: She is well-developed.  Pulmonary:     Effort: Pulmonary effort is normal.  Genitourinary:    Labia:        Right: No rash, tenderness or lesion.        Left: No rash, tenderness or lesion.        Comments: VULVA/LABIA REMARKABLY IMPROVED; NO EVID OF LS (BUT HAS BEEN USING CRM DAILY FOR A FEW DAYS DUE TO RECURRENT SX) Musculoskeletal:  General: Normal range of motion.     Cervical back: Normal range of motion.  Skin:    General: Skin is warm and dry.  Neurological:     General: No focal deficit present.     Mental Status: She is  alert and oriented to person, place, and time.     Cranial Nerves: No cranial nerve deficit.  Psychiatric:        Mood and Affect: Mood normal.        Behavior: Behavior normal.        Thought Content: Thought content normal.        Judgment: Judgment normal.     Assessment/Plan: Lichen sclerosus - Plan: clobetasol ointment (TEMOVATE) 0.05 %; doing well. Recommended maintenance dose of 1-2 times wkly since sx recur when stops tx. F/u in 1 yr/sooner prn. Aware of need for exam yearly due to slight increase risk of SCC.   Sebaceous gland hyperplasia of vulva--LT labia majora. Reassurance.    Meds ordered this encounter  Medications   clobetasol ointment (TEMOVATE) 0.05 %    Sig: Apply pea size amount 1-2 times weekly as maintenance    Dispense:  30 g    Refill:  1    Order Specific Question:   Supervising Provider    Answer:   Hildred Laser [[id number]]      Return in about 1 year (around 10/07/2023).  Kelby Lotspeich B. Dezmond Downie, PA-C 10/07/2022 4:57 PM

## 2022-10-07 NOTE — Patient Instructions (Signed)
I value your feedback and you entrusting us with your care. If you get a [facility] patient survey, I would appreciate you taking the time to let us know about your experience today. Thank you! ? ? ?

## 2022-10-25 DIAGNOSIS — M79674 Pain in right toe(s): Secondary | ICD-10-CM | POA: Diagnosis not present

## 2022-10-25 DIAGNOSIS — S90211A Contusion of right great toe with damage to nail, initial encounter: Secondary | ICD-10-CM | POA: Diagnosis not present

## 2022-11-11 DIAGNOSIS — H6063 Unspecified chronic otitis externa, bilateral: Secondary | ICD-10-CM | POA: Diagnosis not present

## 2022-11-11 DIAGNOSIS — J328 Other chronic sinusitis: Secondary | ICD-10-CM | POA: Diagnosis not present

## 2022-11-18 ENCOUNTER — Other Ambulatory Visit: Payer: Self-pay | Admitting: Internal Medicine

## 2022-11-19 ENCOUNTER — Other Ambulatory Visit: Payer: Self-pay

## 2022-11-19 ENCOUNTER — Other Ambulatory Visit: Payer: Self-pay | Admitting: Internal Medicine

## 2022-11-19 DIAGNOSIS — F331 Major depressive disorder, recurrent, moderate: Secondary | ICD-10-CM

## 2022-11-19 DIAGNOSIS — M797 Fibromyalgia: Secondary | ICD-10-CM

## 2022-11-19 MED ORDER — DULOXETINE HCL 30 MG PO CPEP: 90.0000 mg | ORAL_CAPSULE | Freq: Every day | ORAL | 1 refills | Status: DC

## 2022-11-19 MED ORDER — DICLOFENAC-MISOPROSTOL 75-0.2 MG PO TBEC
DELAYED_RELEASE_TABLET | ORAL | 3 refills | Status: DC
Start: 2022-11-19 — End: 2023-02-24

## 2022-11-22 ENCOUNTER — Ambulatory Visit: Payer: Medicare Other | Attending: Anesthesiology | Admitting: Anesthesiology

## 2022-11-22 ENCOUNTER — Encounter: Payer: Self-pay | Admitting: Anesthesiology

## 2022-11-22 DIAGNOSIS — M5136 Other intervertebral disc degeneration, lumbar region: Secondary | ICD-10-CM | POA: Diagnosis not present

## 2022-11-22 DIAGNOSIS — G894 Chronic pain syndrome: Secondary | ICD-10-CM

## 2022-11-22 DIAGNOSIS — M25512 Pain in left shoulder: Secondary | ICD-10-CM | POA: Diagnosis not present

## 2022-11-22 DIAGNOSIS — M797 Fibromyalgia: Secondary | ICD-10-CM | POA: Diagnosis not present

## 2022-11-22 DIAGNOSIS — Z79891 Long term (current) use of opiate analgesic: Secondary | ICD-10-CM

## 2022-11-22 DIAGNOSIS — M5441 Lumbago with sciatica, right side: Secondary | ICD-10-CM

## 2022-11-22 DIAGNOSIS — M5412 Radiculopathy, cervical region: Secondary | ICD-10-CM

## 2022-11-22 DIAGNOSIS — M542 Cervicalgia: Secondary | ICD-10-CM

## 2022-11-22 DIAGNOSIS — M25511 Pain in right shoulder: Secondary | ICD-10-CM | POA: Diagnosis not present

## 2022-11-22 DIAGNOSIS — M5442 Lumbago with sciatica, left side: Secondary | ICD-10-CM | POA: Diagnosis not present

## 2022-11-22 DIAGNOSIS — G8929 Other chronic pain: Secondary | ICD-10-CM

## 2022-11-22 DIAGNOSIS — F1190 Opioid use, unspecified, uncomplicated: Secondary | ICD-10-CM

## 2022-11-22 DIAGNOSIS — M47816 Spondylosis without myelopathy or radiculopathy, lumbar region: Secondary | ICD-10-CM

## 2022-11-22 DIAGNOSIS — M5432 Sciatica, left side: Secondary | ICD-10-CM

## 2022-11-22 MED ORDER — DIAZEPAM 5 MG PO TABS: 5.0000 mg | ORAL_TABLET | Freq: Every day | ORAL | 2 refills | Status: AC

## 2022-11-25 DIAGNOSIS — J302 Other seasonal allergic rhinitis: Secondary | ICD-10-CM | POA: Diagnosis not present

## 2022-11-25 DIAGNOSIS — H903 Sensorineural hearing loss, bilateral: Secondary | ICD-10-CM | POA: Diagnosis not present

## 2022-11-26 NOTE — Progress Notes (Signed)
Virtual Visit via Telephone Note  I connected with Carrie Myers on 11/26/22 at  1:20 PM EDT by telephone and verified that I am speaking with the correct person using two identifiers.  Location: Patient: Home Provider: Pain control center   I discussed the limitations, risks, security and privacy concerns of performing an evaluation and management service by telephone and the availability of in person appointments. I also discussed with the patient that there may be a patient responsible charge related to this service. The patient expressed understanding and agreed to proceed.   History of Present Illness: I spoke with Carrie Myers via telephone as we are unable link for the video portion conference.  She reports that she is continuing to have occasional cervical pain with tension headaches which are stable in nature.  She continues to have intermittent low back pain as well which continues to respond to intermittent use of hydrocodone for pain management.  She does her stretching exercises and is trying to stay active with walking and daily activity.  She takes Valium 1 tablet at nighttime to help with insomnia and muscle spasms in the neck and low back.  This combination is working well for her without any side effects reported.  Otherwise she is in her usual state of health with no new contributory changes noted today.  The quality characteristic distribution of her neck pain and low back pain are stable in nature with no recent changes.  Upper extremity and lower extremity strength and function bowel and bladder function are stable as well.  She uses the hydrocodone intermittently to help with spasming in the low back when this gets excessive and fails conservative therapy.  She averages approximately 1 tablet/day or less.  Review of systems: General: No fevers or chills Pulmonary: No shortness of breath or dyspnea Cardiac: No angina or palpitations or lightheadedness GI: No abdominal  pain or constipation Psych: No depression    Observations/Objective:  Current Outpatient Medications:    [START ON 11/29/2022] diazepam (VALIUM) 5 MG tablet, Take 1 tablet (5 mg total) by mouth at bedtime., Disp: 30 tablet, Rfl: 2   albuterol (VENTOLIN HFA) 108 (90 Base) MCG/ACT inhaler, Inhale into the lungs., Disp: , Rfl:    Armodafinil 150 MG tablet, Take 150 mg by mouth daily. For fibromyalgia fatigue, Disp: , Rfl:    aspirin EC 81 MG tablet, Take 81 mg by mouth daily., Disp: , Rfl:    clobetasol ointment (TEMOVATE) 0.05 %, Apply pea size amount 1-2 times weekly as maintenance, Disp: 30 g, Rfl: 1   Diclofenac-miSOPROStol 75-0.2 MG TBEC, TAKE (1) TABLET BY MOUTH TWICE DAILY, Disp: 60 tablet, Rfl: 3   DULoxetine (CYMBALTA) 30 MG capsule, Take 3 capsules (90 mg total) by mouth daily., Disp: 270 capsule, Rfl: 1   HYDROcodone-acetaminophen (NORCO/VICODIN) 5-325 MG tablet, Take 1 tablet by mouth 2 (two) times daily as needed for moderate pain., Disp: 45 tablet, Rfl: 0   levocetirizine (XYZAL) 5 MG tablet, Take by mouth., Disp: , Rfl:    levothyroxine (SYNTHROID) 88 MCG tablet, TAKE 1 TABLET BY MOUTH DAILY, Disp: 90 tablet, Rfl: 2   Loperamide HCl (IMODIUM PO), Take 1 tablet by mouth as needed., Disp: , Rfl:    losartan (COZAAR) 100 MG tablet, Take 100 mg by mouth daily., Disp: , Rfl:    Multiple Vitamin (MULTI-VITAMINS) TABS, Take by mouth., Disp: , Rfl:    NON FORMULARY, at bedtime. CPAP @ 11 cm H2O with Oxygen, Disp: , Rfl:  pantoprazole (PROTONIX) 40 MG tablet, Take 1 tablet by mouth 2 (two) times daily., Disp: , Rfl:    pravastatin (PRAVACHOL) 80 MG tablet, Take 80 mg by mouth daily., Disp: , Rfl:    pregabalin (LYRICA) 50 MG capsule, Take 50 mg by mouth 2 (two) times daily., Disp: , Rfl:    sucralfate (CARAFATE) 1 g tablet, Take by mouth., Disp: , Rfl:    Past Medical History:  Diagnosis Date   Allergy    Anxiety    Arthritis    neck, hands, lower back   Bilateral dry eyes     Chronic low back pain 12/23/2016   Coronary artery disease    DDD (degenerative disc disease), lumbar 10/02/2014   Dental crowns present    Implants, front "flipper" while waiting for other implants   Emphysema of lung (HCC)    Fibromyalgia    Fibromyalgia    Fracture of right foot 01/14/2015   GERD (gastroesophageal reflux disease)    History of Graves' disease    Hyperlipidemia    Hypertension    Hypothyroidism    Myocardial infarction (HCC) 04/22/2006   Osteoarthritis    Other shoulder lesions, right shoulder 04/17/2015   Rheumatoid arthritis (HCC)    Sciatica, left side 01/01/2019   Sleep apnea    CPAP   Status post shoulder surgery 05/27/2015   Thyroid disease    Vertigo    none for approx 9 yrs     Assessment and Plan: 1. Chronic, continuous use of opioids   2. Chronic bilateral low back pain without sciatica   3. Fibromyalgia   4. Facet syndrome, lumbar   5. Chronic pain syndrome   6. Chronic bilateral low back pain with bilateral sciatica   7. DDD (degenerative disc disease), lumbar   8. Sciatica, left side   9. Pain of both shoulder joints   10. Cervical radiculitis   11. Cervicalgia   Based on our conversation it is appropriate to refill her medicines for the next 2 months.  We will refill the Valium today but she does not need her hydrocodone refilled.  Continue with current stretching strengthening exercises and TENS unit application and physical therapy modalities.  Continue follow-up with primary care physicians for baseline medical care.  I have reviewed the [facility] practitioner database information is appropriate.  Will schedule her for routine return at the 99-month mark.  Follow Up Instructions:    I discussed the assessment and treatment plan with the patient. The patient was provided an opportunity to ask questions and all were answered. The patient agreed with the plan and demonstrated an understanding of the instructions.   The patient was  advised to call back or seek an in-person evaluation if the symptoms worsen or if the condition fails to improve as anticipated.  I provided 30 minutes of non-face-to-face time during this encounter.   Yevette Edwards, MD

## 2023-01-19 DIAGNOSIS — J432 Centrilobular emphysema: Secondary | ICD-10-CM | POA: Diagnosis not present

## 2023-01-19 DIAGNOSIS — Z23 Encounter for immunization: Secondary | ICD-10-CM | POA: Diagnosis not present

## 2023-01-27 DIAGNOSIS — H903 Sensorineural hearing loss, bilateral: Secondary | ICD-10-CM | POA: Diagnosis not present

## 2023-01-27 DIAGNOSIS — J302 Other seasonal allergic rhinitis: Secondary | ICD-10-CM | POA: Diagnosis not present

## 2023-01-31 DIAGNOSIS — R0602 Shortness of breath: Secondary | ICD-10-CM | POA: Diagnosis not present

## 2023-02-23 ENCOUNTER — Other Ambulatory Visit: Payer: Self-pay | Admitting: Internal Medicine

## 2023-02-23 ENCOUNTER — Other Ambulatory Visit: Payer: Self-pay | Admitting: Anesthesiology

## 2023-02-23 DIAGNOSIS — M797 Fibromyalgia: Secondary | ICD-10-CM

## 2023-02-24 NOTE — Telephone Encounter (Signed)
Requested medication (s) are due for refill today - no  Requested medication (s) are on the active medication list -yes  Future visit scheduled -no  Last refill: 11/19/22 #60 3RF  Notes to clinic: non delegated Rx  Requested Prescriptions  Pending Prescriptions Disp Refills   Diclofenac-miSOPROStol 75-0.2 MG TBEC [Pharmacy Med Name: DICLOFENAC SODIUM/MISOPROSTOL 75-0.2MG  TABLET DR] 60 tablet 3    Sig: TAKE (1) TABLET BY MOUTH TWICE DAILY     Not Delegated - Analgesics:  Antirheumatic Agents - diclofenac/misoprostol Failed - 02/24/2023  8:15 AM      Failed - This refill cannot be delegated      Passed - Cr in normal range and within 360 days    Creatinine, Ser  Date Value Ref Range Status  04/21/2022 0.87 0.57 - 1.00 mg/dL Final         Passed - HGB in normal range and within 360 days    Hemoglobin  Date Value Ref Range Status  04/21/2022 12.2 11.1 - 15.9 g/dL Final         Passed - Patient is not pregnant      Passed - Valid encounter within last 12 months    Recent Outpatient Visits           9 months ago Gastroesophageal reflux disease without esophagitis   [place] [facility], Nyoka Cowden, MD   10 months ago Benign essential HTN   [place] [facility], Nyoka Cowden, MD   1 year ago Influenza A   [place] [facility] at [facility] [address], Nyoka Cowden, MD   1 year ago Arthralgia of left ankle   [place] [facility], Ocie Bob, MD   1 year ago Benign essential HTN   [place] [facility] at [facility], Nyoka Cowden, MD                 Requested Prescriptions  Pending Prescriptions Disp Refills   Diclofenac-miSOPROStol 75-0.2 MG TBEC [Pharmacy Med Name: DICLOFENAC SODIUM/MISOPROSTOL 75-0.2MG  TABLET DR] 60 tablet 3    Sig: TAKE (1) TABLET BY MOUTH TWICE DAILY      Not Delegated - Analgesics:  Antirheumatic Agents - diclofenac/misoprostol Failed - 02/24/2023  8:15 AM      Failed - This refill cannot be delegated      Passed - Cr in normal range and within 360 days    Creatinine, Ser  Date Value Ref Range Status  04/21/2022 0.87 0.57 - 1.00 mg/dL Final         Passed - HGB in normal range and within 360 days    Hemoglobin  Date Value Ref Range Status  04/21/2022 12.2 11.1 - 15.9 g/dL Final         Passed - Patient is not pregnant      Passed - Valid encounter within last 12 months    Recent Outpatient Visits           9 months ago Gastroesophageal reflux disease without esophagitis   [place] [facility], Nyoka Cowden, MD   10 months ago Benign essential HTN   [place] [facility] at [facility], Nyoka Cowden, MD   1 year ago Influenza A   [place] [facility], [facility]   1 year ago Arthralgia of left ankle   [place] [facility] at [facility], Ocie Bob, MD   1 year ago Benign essential HTN   [facility] at [facility], Nyoka Cowden, MD

## 2023-03-28 ENCOUNTER — Telehealth: Payer: Self-pay | Admitting: Internal Medicine

## 2023-03-28 NOTE — Telephone Encounter (Signed)
 Copied from CRM [id number]. Topic: Appointment Scheduling - Scheduling Inquiry for Clinic >> Mar 28, 2023  4:10 PM Carrie Myers wrote: Reason for CRM: The patient would like to be contacted by a member of administrative staff when possible to review their past visits   The patient would like to speak with a member of staff when possible to schedule their next physical as well as discuss dates of past visits and annual wellness concerns   Please contact when possible

## 2023-05-02 ENCOUNTER — Ambulatory Visit: Payer: Medicare Other | Attending: Anesthesiology | Admitting: Anesthesiology

## 2023-05-02 ENCOUNTER — Encounter: Payer: Self-pay | Admitting: Anesthesiology

## 2023-05-02 DIAGNOSIS — M5442 Lumbago with sciatica, left side: Secondary | ICD-10-CM

## 2023-05-02 DIAGNOSIS — M5441 Lumbago with sciatica, right side: Secondary | ICD-10-CM

## 2023-05-02 DIAGNOSIS — F1190 Opioid use, unspecified, uncomplicated: Secondary | ICD-10-CM

## 2023-05-02 DIAGNOSIS — M797 Fibromyalgia: Secondary | ICD-10-CM

## 2023-05-02 DIAGNOSIS — M25512 Pain in left shoulder: Secondary | ICD-10-CM

## 2023-05-02 DIAGNOSIS — M47816 Spondylosis without myelopathy or radiculopathy, lumbar region: Secondary | ICD-10-CM | POA: Diagnosis not present

## 2023-05-02 DIAGNOSIS — G894 Chronic pain syndrome: Secondary | ICD-10-CM

## 2023-05-02 DIAGNOSIS — Z79891 Long term (current) use of opiate analgesic: Secondary | ICD-10-CM

## 2023-05-02 DIAGNOSIS — M47812 Spondylosis without myelopathy or radiculopathy, cervical region: Secondary | ICD-10-CM

## 2023-05-02 DIAGNOSIS — M5432 Sciatica, left side: Secondary | ICD-10-CM

## 2023-05-02 DIAGNOSIS — G8929 Other chronic pain: Secondary | ICD-10-CM

## 2023-05-02 DIAGNOSIS — M25511 Pain in right shoulder: Secondary | ICD-10-CM | POA: Diagnosis not present

## 2023-05-02 DIAGNOSIS — M5412 Radiculopathy, cervical region: Secondary | ICD-10-CM | POA: Diagnosis not present

## 2023-05-02 MED ORDER — DIAZEPAM 5 MG PO TABS: 5.0000 mg | ORAL_TABLET | Freq: Every evening | ORAL | 2 refills | Status: DC | PRN

## 2023-05-02 NOTE — Progress Notes (Signed)
Virtual Visit via Telephone Note  I connected with [name] on 05/02/23 at  4:00 PM EST by telephone and verified that I am speaking with the correct person using two identifiers.  Location: Patient: Home Provider: [facility] control center   I discussed the limitations, risks, security and privacy concerns of performing an evaluation and management service by telephone and the availability of in person appointments. I also discussed with the patient that there may be a patient responsible charge related to this service. The patient expressed understanding and agreed to proceed.   History of Present Illness: I was able to catch up with Carrie Myers for her virtual visit.  We had to do this via telephone as we were unable to link for the video portion.  She reports that she is still having a fair amount of low back pain with radiation into the right posterior lateral leg.  She is also getting a tingling sensation and pain across the lumbar spine region.  She has been somewhat limited by this and unable to do her physical therapy exercises.  In further review her last MRI was several years ago but the pain has been persistent.  Approximately 1year ago we did an epidural steroid injection that helped her with some of the sciatica symptoms.  The back pain has persisted despite this.  She has had recurrence of the sciatica symptom.  She occasionally gets some giveaway weakness in the knees additionally.  Bowel bladder function has been nonproblematic.  She did also have a facet injection back in October 2023 which was very effective for her low back pain.  At this point she is having recurrence of that same type of pain but it has been longstanding with occasional giveaway weakness to the knees and has been several years since her last MRI.  She does take occasional hydrocodone but uses this very sparingly and some tramadol occasionally.  She uses Valium once at bedtime to help with muscle spasms  overnight and sleep.  She also does mention that she has been somewhat sleepy during the early morning and has a bit of difficulty getting going however she has been on 90 mg of duloxetine secondary to some depression associated with the care of her brother.  Review of systems: General: No fevers or chills Pulmonary: No shortness of breath or dyspnea Cardiac: No angina or palpitations or lightheadedness GI: No abdominal pain or constipation Psych: History of depression but stable    Observations/Objective:  Current Outpatient Medications:    albuterol (VENTOLIN HFA) 108 (90 Base) MCG/ACT inhaler, Inhale into the lungs., Disp: , Rfl:    Armodafinil 150 MG tablet, Take 150 mg by mouth daily. For fibromyalgia fatigue, Disp: , Rfl:    aspirin EC 81 MG tablet, Take 81 mg by mouth daily., Disp: , Rfl:    clobetasol ointment (TEMOVATE) 0.05 %, Apply pea size amount 1-2 times weekly as maintenance, Disp: 30 g, Rfl: 1   [START ON 05/27/2023] diazepam (VALIUM) 5 MG tablet, Take 1 tablet (5 mg total) by mouth at bedtime as needed for anxiety., Disp: 30 tablet, Rfl: 2   Diclofenac-miSOPROStol 75-0.2 MG TBEC, TAKE (1) TABLET BY MOUTH TWICE DAILY, Disp: 60 tablet, Rfl: 3   DULoxetine (CYMBALTA) 30 MG capsule, Take 3 capsules (90 mg total) by mouth daily., Disp: 270 capsule, Rfl: 1   HYDROcodone-acetaminophen (NORCO/VICODIN) 5-325 MG tablet, Take 1 tablet by mouth 2 (two) times daily as needed for moderate pain., Disp: 45 tablet, Rfl: 0  levocetirizine (XYZAL) 5 MG tablet, Take by mouth., Disp: , Rfl:    levothyroxine (SYNTHROID) 88 MCG tablet, TAKE 1 TABLET BY MOUTH DAILY, Disp: 90 tablet, Rfl: 2   Loperamide HCl (IMODIUM PO), Take 1 tablet by mouth as needed., Disp: , Rfl:    losartan (COZAAR) 100 MG tablet, Take 100 mg by mouth daily., Disp: , Rfl:    Multiple Vitamin (MULTI-VITAMINS) TABS, Take by mouth., Disp: , Rfl:    NON FORMULARY, at bedtime. CPAP @ 11 cm H2O with Oxygen, Disp: , Rfl:     pantoprazole (PROTONIX) 40 MG tablet, Take 1 tablet by mouth 2 (two) times daily., Disp: , Rfl:    pravastatin (PRAVACHOL) 80 MG tablet, Take 80 mg by mouth daily., Disp: , Rfl:    pregabalin (LYRICA) 50 MG capsule, Take 50 mg by mouth 2 (two) times daily., Disp: , Rfl:    sucralfate (CARAFATE) 1 g tablet, Take by mouth., Disp: , Rfl:    Past Medical History:  Diagnosis Date   Allergy    Anxiety    Arthritis    neck, hands, lower back   Bilateral dry eyes    Chronic low back pain 12/23/2016   Coronary artery disease    DDD (degenerative disc disease), lumbar 10/02/2014   Dental crowns present    Implants, front "flipper" while waiting for other implants   Emphysema of lung (HCC)    Fibromyalgia    Fibromyalgia    Fracture of right foot 01/14/2015   GERD (gastroesophageal reflux disease)    History of Graves' disease    Hyperlipidemia    Hypertension    Hypothyroidism    Myocardial infarction (HCC) 04/22/2006   Osteoarthritis    Other shoulder lesions, right shoulder 04/17/2015   Rheumatoid arthritis (HCC)    Sciatica, left side 01/01/2019   Sleep apnea    CPAP   Status post shoulder surgery 05/27/2015   Thyroid disease    Vertigo    none for approx 9 yrs   Assessment and Plan:  1. Chronic, continuous use of opioids   2. Chronic bilateral low back pain without sciatica   3. Fibromyalgia   4. Facet syndrome, lumbar   5. Chronic pain syndrome   6. Chronic bilateral low back pain with bilateral sciatica   7. Sciatica, left side   8. Pain of both shoulder joints   9. Cervical radiculitis   10. Cervical facet joint syndrome    Based on conversation I gets appropriate to refill her Valium.  This is working well for her and helping with her spasms.  I have talked to her in regards to the early morning sedation and lethargy.  She is due to follow-up with her primary care physician here within the next 2 weeks where she will ask to address whether she can dial back some of  the duloxetine.  She does not want to renew her hydrocodone at this stage.  Furthermore, I am going to request a repeat MRI of the lumbar spine to evaluate her facet and lumbar disc status.  I want her to continue with physical therapy exercises and we may look to a formal physical therapy evaluation contingent on her MRI findings.  In the meantime, continue follow-up with her primary care physicians for baseline medical care as she is doing. Follow Up Instructions:    I discussed the assessment and treatment plan with the patient. The patient was provided an opportunity to ask questions and all were answered. The  patient agreed with the plan and demonstrated an understanding of the instructions.   The patient was advised to call back or seek an in-person evaluation if the symptoms worsen or if the condition fails to improve as anticipated.  I provided 30 minutes of non-face-to-face time during this encounter.   Yevette Edwards, MD

## 2023-05-02 NOTE — Patient Instructions (Signed)

## 2023-05-18 ENCOUNTER — Ambulatory Visit (INDEPENDENT_AMBULATORY_CARE_PROVIDER_SITE_OTHER): Payer: Medicare Other | Admitting: Internal Medicine

## 2023-05-18 ENCOUNTER — Encounter: Payer: Self-pay | Admitting: Internal Medicine

## 2023-05-18 VITALS — BP 112/68 | HR 93 | Ht 68.0 in | Wt 176.4 lb

## 2023-05-18 DIAGNOSIS — F331 Major depressive disorder, recurrent, moderate: Secondary | ICD-10-CM

## 2023-05-18 DIAGNOSIS — R7303 Prediabetes: Secondary | ICD-10-CM | POA: Diagnosis not present

## 2023-05-18 DIAGNOSIS — E782 Mixed hyperlipidemia: Secondary | ICD-10-CM

## 2023-05-18 DIAGNOSIS — E890 Postprocedural hypothyroidism: Secondary | ICD-10-CM | POA: Diagnosis not present

## 2023-05-18 DIAGNOSIS — K219 Gastro-esophageal reflux disease without esophagitis: Secondary | ICD-10-CM

## 2023-05-18 DIAGNOSIS — Z1231 Encounter for screening mammogram for malignant neoplasm of breast: Secondary | ICD-10-CM

## 2023-05-18 DIAGNOSIS — I10 Essential (primary) hypertension: Secondary | ICD-10-CM

## 2023-05-18 NOTE — Progress Notes (Signed)
 Date:  05/18/2023   Name:  Carrie Myers   DOB:  11/18/1945   MRN:  [id number]   Chief Complaint: Annual Exam and Medication Management (Patient said she is taking 90 mgs of valium, dropped to 60mg , has been sleeping a little to much ) Carrie Myers is a 78 y.o. female who presents today for her Complete Annual Exam. She feels well. She reports exercising patient said she walks her dog, 15 minutes. She reports she is sleeping fairly well, patient said she thinks she sleeps to much. Breast complaints - none.  Health Maintenance  Topic Date Due   Zoster (Shingles) Vaccine (1 of 2) Never done   DTaP/Tdap/Td vaccine (2 - Td or Tdap) 09/07/2021   Mammogram  11/05/2022   Medicare Annual Wellness Visit  07/08/2023   COVID-19 Vaccine (3 - 2024-25 season) 06/03/2023*   Flu Shot  06/13/2023*   Pneumonia Vaccine  Completed   DEXA scan (bone density measurement)  Completed   Hepatitis C Screening  Completed   HPV Vaccine  Aged Out   Colon Cancer Screening  Discontinued  *Topic was postponed. The date shown is not the original due date.      Hypertension This is a chronic problem. The problem is controlled. Pertinent negatives include no chest pain, headaches, palpitations or shortness of breath. Identifiable causes of hypertension include a thyroid problem.  Thyroid Problem Presents for follow-up visit. Patient reports no constipation, diarrhea, fatigue or palpitations. The symptoms have been stable. Her past medical history is significant for hyperlipidemia.  Hyperlipidemia This is a chronic problem. Pertinent negatives include no chest pain, myalgias or shortness of breath. Current antihyperlipidemic treatment includes statins. The current treatment provides significant improvement of lipids.  Diabetes She presents for her follow-up diabetic visit. Diabetes type: prediabetes. Her disease course has been stable. Pertinent negatives for hypoglycemia include no dizziness or headaches.  Pertinent negatives for diabetes include no chest pain, no fatigue and no weakness.    Review of Systems  Constitutional:  Negative for fatigue and unexpected weight change.  HENT:  Negative for trouble swallowing.   Eyes:  Negative for visual disturbance.  Respiratory:  Negative for cough, chest tightness, shortness of breath and wheezing.   Cardiovascular:  Negative for chest pain, palpitations and leg swelling.  Gastrointestinal:  Negative for abdominal pain, constipation and diarrhea.  Musculoskeletal:  Negative for arthralgias and myalgias.  Neurological:  Negative for dizziness, weakness, light-headedness and headaches.     Lab Results  Component Value Date   NA 140 04/21/2022   K 4.4 04/21/2022   CO2 24 04/21/2022   GLUCOSE 94 04/21/2022   BUN 21 04/21/2022   CREATININE 0.87 04/21/2022   CALCIUM 9.2 04/21/2022   EGFR 70 04/21/2022   GFRNONAA 57 (L) 03/28/2020   Lab Results  Component Value Date   CHOL 140 04/21/2022   HDL 55 04/21/2022   LDLCALC 64 04/21/2022   TRIG 120 04/21/2022   CHOLHDL 2.5 04/21/2022   Lab Results  Component Value Date   TSH 1.880 04/21/2022   Lab Results  Component Value Date   HGBA1C 6.3 (H) 04/21/2022   Lab Results  Component Value Date   WBC 5.3 04/21/2022   HGB 12.2 04/21/2022   HCT 37.7 04/21/2022   MCV 89 04/21/2022   PLT 302 04/21/2022   Lab Results  Component Value Date   ALT 26 04/21/2022   AST 28 04/21/2022   ALKPHOS 82 04/21/2022   BILITOT 0.3 04/21/2022  No results found for: "25OHVITD2", "25OHVITD3", "VD25OH"   Patient Active Problem List   Diagnosis Date Noted   Arthralgia of left ankle 10/20/2021   Osteopenia determined by x-ray 03/31/2021   Primary osteoarthritis of left knee 01/22/2021   Effusion, right knee 12/15/2020   Primary osteoarthritis of right knee 11/24/2020   Overactive bladder 06/18/2020   Prediabetes 04/14/2020   Slow transit constipation 06/05/2019   Sciatica, left side 01/01/2019    Diverticulosis of colon 04/25/2018   Change in bowel habits    Tobacco use disorder, moderate, in sustained remission 12/22/2017   Moderate episode of recurrent major depressive disorder (HCC) 10/03/2017   Inflammatory spondylopathy of lumbar region (HCC) 10/03/2017   Hoarseness, persistent 04/18/2017   OSA on CPAP 02/18/2017   Cervical disc disease 12/31/2016   Generalized osteoarthritis 12/23/2016   Chronic pain syndrome 12/23/2016   Cystocele, midline 12/06/2016   Urinary incontinence 12/06/2016   Impingement syndrome of right shoulder 04/17/2015   Fibrocystic breast 01/18/2015   Gastroesophageal reflux disease without esophagitis 01/18/2015   Hypothyroidism, postablative 01/18/2015   Arteriosclerosis of coronary artery 12/29/2014   DJD of shoulder 11/13/2014   Fibromyalgia 10/02/2014   Intercostal neuralgia 10/02/2014   Facet syndrome, lumbar 10/02/2014   Sacroiliac joint disease 10/02/2014   Carotid artery narrowing 07/09/2014   Essential hypertension 07/01/2014   Osteoarthritis of thumb 09/24/2012   Mixed hyperlipidemia 09/22/2012   Arthritis of hand, degenerative 09/22/2012    Allergies  Allergen Reactions   Amoxicillin-Pot Clavulanate Nausea And Vomiting and Other (See Comments)   Codeine Nausea And Vomiting and Other (See Comments)   Sulfa Antibiotics Nausea And Vomiting   Tolterodine Diarrhea   Proparacaine Itching   Shellfish Allergy Nausea And Vomiting    No issues with iodine     Past Surgical History:  Procedure Laterality Date   ABDOMINAL HYSTERECTOMY     total   APPENDECTOMY     BREAST BIOPSY Right    neg   BREAST CYST ASPIRATION Left    neg   BREAST EXCISIONAL BIOPSY Right    BUNIONECTOMY Bilateral    CESAREAN SECTION     COLONOSCOPY  2010   normal   COLONOSCOPY WITH PROPOFOL N/A 04/03/2018   Procedure: COLONOSCOPY WITH BIOPSIES;  Surgeon: Midge Minium, MD;  Location: [facility];  Service: Endoscopy;  Laterality: N/A;  sleep apnea    CORONARY ANGIOPLASTY WITH STENT PLACEMENT  04/2006   dental implant     [age]   DILATION AND CURETTAGE OF UTERUS     epidural steroid injection  2018   ESOPHAGOGASTRODUODENOSCOPY N/A 10/03/2020   Procedure: ESOPHAGOGASTRODUODENOSCOPY (EGD);  Surgeon: Regis Bill, MD;  Location: [facility];  Service: Endoscopy;  Laterality: N/A;  REQUESTING MORNING AFTER 9 AM   ESOPHAGOGASTRODUODENOSCOPY (EGD) WITH PROPOFOL N/A 08/17/2022   Procedure: ESOPHAGOGASTRODUODENOSCOPY (EGD) WITH PROPOFOL;  Surgeon: Regis Bill, MD;  Location: [facility] ENDOSCOPY;  Service: Endoscopy;  Laterality: N/A;   EYE SURGERY     bilateral cataract extraction   SHOULDER ARTHROSCOPY Right 05/07/2015   Procedure: Extensive arthroscopic debridement and arthroscopic subacromial decompression, right shoulder.                        2.  Steroid injection right thumb CMC joint.;  Surgeon: Christena Flake, MD;  Location: [facility];  Service: Orthopedics;  Laterality: Right;   TOOTH EXTRACTION     Upper centrals and lateral    Social History   Tobacco  Use   Smoking status: Former    Current packs/day: 0.00    Average packs/day: 1.3 packs/day for 44.0 years (55.0 ttl pk-yrs)    Types: Cigarettes    Start date: 04/22/1962    Quit date: 04/22/2006    Years since quitting: 17.0   Smokeless tobacco: Never   Tobacco comments:       Vaping Use   Vaping status: Never Used  Substance Use Topics   Alcohol use: Not Currently   Drug use: Never     Medication list has been reviewed and updated.  Current Meds  Medication Sig   Armodafinil 150 MG tablet Take 150 mg by mouth daily. For fibromyalgia fatigue   aspirin EC 81 MG tablet Take 81 mg by mouth daily.   clobetasol ointment (TEMOVATE) 0.05 % Apply pea size amount 1-2 times weekly as maintenance   [START ON 05/27/2023] diazepam (VALIUM) 5 MG tablet Take 1 tablet (5 mg total) by mouth at bedtime as needed for anxiety.   Diclofenac-miSOPROStol 75-0.2 MG TBEC TAKE  (1) TABLET BY MOUTH TWICE DAILY   DULoxetine (CYMBALTA) 30 MG capsule Take 3 capsules (90 mg total) by mouth daily.   fluticasone (FLONASE) 50 MCG/ACT nasal spray    levocetirizine (XYZAL) 5 MG tablet Take by mouth.   levothyroxine (SYNTHROID) 88 MCG tablet TAKE 1 TABLET BY MOUTH DAILY   Loperamide HCl (IMODIUM PO) Take 1 tablet by mouth as needed.   losartan (COZAAR) 100 MG tablet Take 100 mg by mouth daily.   Multiple Vitamin (MULTI-VITAMINS) TABS Take by mouth.   NON FORMULARY at bedtime. CPAP @ 11 cm H2O with Oxygen   pantoprazole (PROTONIX) 40 MG tablet Take 1 tablet by mouth 2 (two) times daily.   pravastatin (PRAVACHOL) 80 MG tablet Take 80 mg by mouth daily.   pregabalin (LYRICA) 50 MG capsule Take 50 mg by mouth 2 (two) times daily.   sucralfate (CARAFATE) 1 g tablet Take by mouth.       05/18/2023    2:45 PM 05/21/2022    3:42 PM 04/21/2022   10:59 AM 02/23/2022    1:59 PM  GAD 7 : Generalized Anxiety Score  Nervous, Anxious, on Edge 0 0 3 0  Control/stop worrying 0 0 3 0  Worry too much - different things 0 0 3 0  Trouble relaxing 0 0 0 0  Restless 0 0 0 0  Easily annoyed or irritable 1 1 2  0  Afraid - awful might happen 0 0 0 0  Total GAD 7 Score 1 1 11  0  Anxiety Difficulty Somewhat difficult Not difficult at all Very difficult Not difficult at all       05/18/2023    2:45 PM 07/08/2022    9:34 AM 06/21/2022    2:45 PM  Depression screen PHQ 2/9  Decreased Interest 0 0 0  Down, Depressed, Hopeless 0 0 0  PHQ - 2 Score 0 0 0  Altered sleeping 3 0   Tired, decreased energy 3 0   Change in appetite 3 0   Feeling bad or failure about yourself  0 0   Trouble concentrating 0 0   Moving slowly or fidgety/restless 0 0   Suicidal thoughts 0 0   PHQ-9 Score 9 0   Difficult doing work/chores Not difficult at all Not difficult at all     BP Readings from Last 3 Encounters:  05/18/23 112/68  08/17/22 (!) 151/85  06/21/22 125/60    Physical Exam  Vitals and nursing note  reviewed.  Constitutional:      General: She is not in acute distress.    Appearance: She is well-developed.  HENT:     Head: Normocephalic and atraumatic.     Right Ear: Tympanic membrane and ear canal normal.     Left Ear: Tympanic membrane and ear canal normal.     Nose:     Right Sinus: No maxillary sinus tenderness.     Left Sinus: No maxillary sinus tenderness.  Eyes:     General: No scleral icterus.       Right eye: No discharge.        Left eye: No discharge.     Conjunctiva/sclera: Conjunctivae normal.  Neck:     Thyroid: No thyromegaly.     Vascular: No carotid bruit.  Cardiovascular:     Rate and Rhythm: Normal rate and regular rhythm.     Pulses: Normal pulses.     Heart sounds: Normal heart sounds.  Pulmonary:     Effort: Pulmonary effort is normal. No respiratory distress.     Breath sounds: No wheezing.  Abdominal:     General: Bowel sounds are normal.     Palpations: Abdomen is soft.     Tenderness: There is no abdominal tenderness.  Musculoskeletal:     Cervical back: Normal range of motion. No erythema.     Right lower leg: No edema.     Left lower leg: No edema.  Lymphadenopathy:     Cervical: No cervical adenopathy.  Skin:    General: Skin is warm and dry.     Findings: No rash.  Neurological:     Mental Status: She is alert and oriented to person, place, and time.     Cranial Nerves: No cranial nerve deficit.     Sensory: No sensory deficit.     Deep Tendon Reflexes: Reflexes are normal and symmetric.  Psychiatric:        Attention and Perception: Attention normal.        Mood and Affect: Mood normal.     Wt Readings from Last 3 Encounters:  05/18/23 176 lb 6 oz (80 kg)  10/07/22 176 lb (79.8 kg)  08/17/22 176 lb (79.8 kg)    BP 112/68   Pulse 93   Ht 5\' 8"  (1.727 m)   Wt 176 lb 6 oz (80 kg)   SpO2 95%   BMI 26.82 kg/m   Assessment and Plan:  Problem List Items Addressed This Visit       Unprioritized   Essential hypertension  - Primary   Relevant Orders   CBC with Differential/Platelet   Comprehensive metabolic panel   Mixed hyperlipidemia (Chronic)   LDL is  Lab Results  Component Value Date   LDLCALC 64 04/21/2022   Current regimen is pravastatin.  Tolerating medications well without issues.       Relevant Orders   Lipid panel   Gastroesophageal reflux disease without esophagitis (Chronic)   Reflux symptoms are minimal on current therapy - pantoprazole. No red flag signs such as weight loss, n/v, melena       Relevant Orders   CBC with Differential/Platelet   Hypothyroidism, postablative (Chronic)   supplemented      Relevant Orders   TSH + free T4   Moderate episode of recurrent major depressive disorder (HCC) (Chronic)   Doing well on Cymbalta and wants to try to wean off. Not sure she is getting much benefit for  pain anyway      Prediabetes (Chronic)   Managed with diet alone.       Relevant Orders   Hemoglobin A1c   Other Visit Diagnoses       Encounter for screening mammogram for breast cancer       Relevant Orders   MM 3D SCREENING MAMMOGRAM BILATERAL BREAST       Return in about 6 months (around 11/18/2023) for HTN, Depression.    [name], MD [facility] [place]

## 2023-05-18 NOTE — Assessment & Plan Note (Signed)
 Reflux symptoms are minimal on current therapy - pantoprazole. No red flag signs such as weight loss, n/v, melena

## 2023-05-18 NOTE — Assessment & Plan Note (Signed)
 LDL is  Lab Results  Component Value Date   LDLCALC 64 04/21/2022   Current regimen is pravastatin.  Tolerating medications well without issues.

## 2023-05-18 NOTE — Assessment & Plan Note (Signed)
 Doing well on Cymbalta and wants to try to wean off. Not sure she is getting much benefit for pain anyway

## 2023-05-18 NOTE — Assessment & Plan Note (Signed)
 supplemented

## 2023-05-18 NOTE — Patient Instructions (Signed)
 Call [facility] to schedule your mammogram at [phone].

## 2023-05-18 NOTE — Assessment & Plan Note (Signed)
Managed with diet alone 

## 2023-05-19 ENCOUNTER — Encounter: Payer: Self-pay | Admitting: Internal Medicine

## 2023-05-19 LAB — COMPREHENSIVE METABOLIC PANEL
ALT: 21 IU/L (ref 0–32)
AST: 25 IU/L (ref 0–40)
Albumin: 4.8 g/dL (ref 3.8–4.8)
Alkaline Phosphatase: 91 IU/L (ref 44–121)
BUN/Creatinine Ratio: 18 (ref 12–28)
BUN: 17 mg/dL (ref 8–27)
Bilirubin Total: 0.3 mg/dL (ref 0.0–1.2)
CO2: 25 mmol/L (ref 20–29)
Calcium: 9.6 mg/dL (ref 8.7–10.3)
Chloride: 103 mmol/L (ref 96–106)
Creatinine, Ser: 0.93 mg/dL (ref 0.57–1.00)
Globulin, Total: 2.5 g/dL (ref 1.5–4.5)
Glucose: 91 mg/dL (ref 70–99)
Potassium: 4.1 mmol/L (ref 3.5–5.2)
Sodium: 144 mmol/L (ref 134–144)
Total Protein: 7.3 g/dL (ref 6.0–8.5)
eGFR: 64 mL/min/{1.73_m2} (ref >59)

## 2023-05-19 LAB — LIPID PANEL
Chol/HDL Ratio: 2.5 ratio (ref 0.0–4.4)
Cholesterol, Total: 148 mg/dL (ref 100–199)
HDL: 59 mg/dL (ref >39)
LDL Chol Calc (NIH): 64 mg/dL (ref 0–99)
Triglycerides: 146 mg/dL (ref 0–149)
VLDL Cholesterol Cal: 25 mg/dL (ref 5–40)

## 2023-05-19 LAB — CBC WITH DIFFERENTIAL/PLATELET
Basophils Absolute: 0 10*3/uL (ref 0.0–0.2)
Basos: 1 %
EOS (ABSOLUTE): 0.2 10*3/uL (ref 0.0–0.4)
Eos: 3 %
Hematocrit: 38.1 % (ref 34.0–46.6)
Hemoglobin: 12.6 g/dL (ref 11.1–15.9)
Immature Grans (Abs): 0 10*3/uL (ref 0.0–0.1)
Immature Granulocytes: 0 %
Lymphocytes Absolute: 1.5 10*3/uL (ref 0.7–3.1)
Lymphs: 26 %
MCH: 28.6 pg (ref 26.6–33.0)
MCHC: 33.1 g/dL (ref 31.5–35.7)
MCV: 87 fL (ref 79–97)
Monocytes Absolute: 0.4 10*3/uL (ref 0.1–0.9)
Monocytes: 8 %
Neutrophils Absolute: 3.6 10*3/uL (ref 1.4–7.0)
Neutrophils: 62 %
Platelets: 303 10*3/uL (ref 150–450)
RBC: 4.4 x10E6/uL (ref 3.77–5.28)
RDW: 12.4 % (ref 11.7–15.4)
WBC: 5.8 10*3/uL (ref 3.4–10.8)

## 2023-05-19 LAB — TSH+FREE T4
Free T4: 1.27 ng/dL (ref 0.82–1.77)
TSH: 1.51 u[IU]/mL (ref 0.450–4.500)

## 2023-05-19 LAB — HEMOGLOBIN A1C
Est. average glucose Bld gHb Est-mCnc: 137 mg/dL
Hgb A1c MFr Bld: 6.4 % — ABNORMAL HIGH (ref 4.8–5.6)

## 2023-05-24 ENCOUNTER — Ambulatory Visit
Admission: RE | Admit: 2023-05-24 | Discharge: 2023-05-24 | Disposition: A | Discharge status: Home / Self Care | Source: Ambulatory Visit | Attending: Anesthesiology | Admitting: Anesthesiology

## 2023-05-24 DIAGNOSIS — M5137 Other intervertebral disc degeneration, lumbosacral region with discogenic back pain only: Secondary | ICD-10-CM | POA: Diagnosis not present

## 2023-05-24 DIAGNOSIS — M48061 Spinal stenosis, lumbar region without neurogenic claudication: Secondary | ICD-10-CM | POA: Diagnosis not present

## 2023-05-24 DIAGNOSIS — I77811 Abdominal aortic ectasia: Secondary | ICD-10-CM | POA: Diagnosis not present

## 2023-05-24 DIAGNOSIS — M5441 Lumbago with sciatica, right side: Secondary | ICD-10-CM | POA: Insufficient documentation

## 2023-05-24 DIAGNOSIS — M47816 Spondylosis without myelopathy or radiculopathy, lumbar region: Secondary | ICD-10-CM | POA: Diagnosis not present

## 2023-05-24 DIAGNOSIS — G8929 Other chronic pain: Secondary | ICD-10-CM | POA: Diagnosis not present

## 2023-05-24 DIAGNOSIS — M5432 Sciatica, left side: Secondary | ICD-10-CM | POA: Diagnosis not present

## 2023-05-24 DIAGNOSIS — M4726 Other spondylosis with radiculopathy, lumbar region: Secondary | ICD-10-CM | POA: Diagnosis not present

## 2023-05-24 DIAGNOSIS — M5442 Lumbago with sciatica, left side: Secondary | ICD-10-CM | POA: Diagnosis not present

## 2023-06-07 ENCOUNTER — Other Ambulatory Visit: Payer: Self-pay | Admitting: Internal Medicine

## 2023-06-09 ENCOUNTER — Telehealth: Payer: Self-pay | Admitting: Anesthesiology

## 2023-06-09 NOTE — Telephone Encounter (Signed)
 MRI has not been read. No results in computer.

## 2023-06-09 NOTE — Telephone Encounter (Signed)
 Requested Prescriptions  Pending Prescriptions Disp Refills   levothyroxine (SYNTHROID) 88 MCG tablet [Pharmacy Med Name: Levothyroxine Sodium 88 MCG Oral Tablet] 90 tablet 3    Sig: TAKE 1 TABLET BY MOUTH DAILY     Endocrinology:  Hypothyroid Agents Passed - 06/09/2023 10:38 AM      Passed - TSH in normal range and within 360 days    TSH  Date Value Ref Range Status  05/18/2023 1.510 0.450 - 4.500 uIU/mL Final         Passed - Valid encounter within last 12 months    Recent Outpatient Visits           3 weeks ago Essential hypertension   [place] [facility] at [facility], Nyoka Cowden, MD       Future Appointments             In 4 months Carrie Myers, Nyoka Cowden, MD [facility] at [facility], [facility]

## 2023-06-09 NOTE — Telephone Encounter (Signed)
 Patient would like results of her MRI please

## 2023-06-09 NOTE — Telephone Encounter (Signed)
 Called radiology to read results. They state a note will be sent.

## 2023-06-21 DIAGNOSIS — I6529 Occlusion and stenosis of unspecified carotid artery: Secondary | ICD-10-CM | POA: Diagnosis not present

## 2023-06-21 DIAGNOSIS — I2510 Atherosclerotic heart disease of native coronary artery without angina pectoris: Secondary | ICD-10-CM | POA: Diagnosis not present

## 2023-06-27 ENCOUNTER — Ambulatory Visit: Attending: Anesthesiology | Admitting: Anesthesiology

## 2023-06-27 ENCOUNTER — Encounter: Payer: Self-pay | Admitting: Anesthesiology

## 2023-06-27 DIAGNOSIS — M25511 Pain in right shoulder: Secondary | ICD-10-CM | POA: Diagnosis not present

## 2023-06-27 DIAGNOSIS — M47816 Spondylosis without myelopathy or radiculopathy, lumbar region: Secondary | ICD-10-CM

## 2023-06-27 DIAGNOSIS — M5412 Radiculopathy, cervical region: Secondary | ICD-10-CM | POA: Diagnosis not present

## 2023-06-27 DIAGNOSIS — M797 Fibromyalgia: Secondary | ICD-10-CM | POA: Diagnosis not present

## 2023-06-27 DIAGNOSIS — M5441 Lumbago with sciatica, right side: Secondary | ICD-10-CM

## 2023-06-27 DIAGNOSIS — G8929 Other chronic pain: Secondary | ICD-10-CM

## 2023-06-27 DIAGNOSIS — M25512 Pain in left shoulder: Secondary | ICD-10-CM

## 2023-06-27 DIAGNOSIS — Z79891 Long term (current) use of opiate analgesic: Secondary | ICD-10-CM | POA: Diagnosis not present

## 2023-06-27 DIAGNOSIS — M5442 Lumbago with sciatica, left side: Secondary | ICD-10-CM | POA: Diagnosis not present

## 2023-06-27 DIAGNOSIS — F1190 Opioid use, unspecified, uncomplicated: Secondary | ICD-10-CM

## 2023-06-27 DIAGNOSIS — G894 Chronic pain syndrome: Secondary | ICD-10-CM

## 2023-06-27 DIAGNOSIS — M545 Low back pain, unspecified: Secondary | ICD-10-CM

## 2023-06-27 DIAGNOSIS — M5432 Sciatica, left side: Secondary | ICD-10-CM

## 2023-06-27 MED ORDER — DIAZEPAM 5 MG PO TABS
5.0000 mg | ORAL_TABLET | Freq: Every evening | ORAL | 2 refills | Status: DC | PRN
Start: 2023-07-07 — End: 2023-07-19

## 2023-06-28 NOTE — Progress Notes (Signed)
 Virtual Visit via Video Note  I connected with Carrie Myers on 06/28/23 at  3:00 PM EDT by a video enabled telemedicine application and verified that I am speaking with the correct person using two identifiers.  Location: Patient: Home Provider: [facility] control [facility]   I discussed the limitations of evaluation and management by telemedicine and the availability of in person appointments. The patient expressed understanding and agreed to proceed.  History of Present Illness: I spoke with Carrie Myers regarding her persistent lower back pain and intermittent cervical neck pain.  She reports that she is having a fair amount of sciatica symptoms affecting both lower extremities.  She takes her medication as prescribed generally using anti-inflammatory medications and occasional hydrocodone very sparingly.  This combination generally works fairly well for her.  She does have a lot of of the neck spasming and occasional lumbar spasming for which she takes Valium 1 tablet at bedtime to help which is very effective for her.  No change in lower extremity strength function bowel or bladder function is noted at this time otherwise she has been stable.  She is trying to stay active and increase her activity especially with the warmer weather.  She has had more symptoms of sciatica and in the past has had epidural steroid injections for these however would like to defer at this time.  With the injection she would generally get about 3 to 4 months worth of relief from her low back pain rated at 80 to 90% and almost complete resolution of her sciatica symptoms.  Otherwise she is in her usual state of health.  Review of systems: General: No fevers or chills Pulmonary: No shortness of breath or dyspnea Cardiac: No angina or palpitations or lightheadedness GI: No abdominal pain or constipation Psych: No depression    Observations/Objective:  Current Outpatient Medications:    albuterol (VENTOLIN HFA)  108 (90 Base) MCG/ACT inhaler, Inhale into the lungs., Disp: , Rfl:    Armodafinil 150 MG tablet, Take 150 mg by mouth daily. For fibromyalgia fatigue, Disp: , Rfl:    aspirin EC 81 MG tablet, Take 81 mg by mouth daily., Disp: , Rfl:    clobetasol ointment (TEMOVATE) 0.05 %, Apply pea size amount 1-2 times weekly as maintenance, Disp: 30 g, Rfl: 1   [START ON 07/07/2023] diazepam (VALIUM) 5 MG tablet, Take 1 tablet (5 mg total) by mouth at bedtime as needed for anxiety., Disp: 30 tablet, Rfl: 2   Diclofenac-miSOPROStol 75-0.2 MG TBEC, TAKE (1) TABLET BY MOUTH TWICE DAILY, Disp: 60 tablet, Rfl: 3   DULoxetine (CYMBALTA) 30 MG capsule, Take 3 capsules (90 mg total) by mouth daily., Disp: 270 capsule, Rfl: 1   fluticasone (FLONASE) 50 MCG/ACT nasal spray, , Disp: , Rfl:    HYDROcodone-acetaminophen (NORCO/VICODIN) 5-325 MG tablet, Take 1 tablet by mouth 2 (two) times daily as needed for moderate pain., Disp: 45 tablet, Rfl: 0   levocetirizine (XYZAL) 5 MG tablet, Take by mouth., Disp: , Rfl:    levothyroxine (SYNTHROID) 88 MCG tablet, TAKE 1 TABLET BY MOUTH DAILY, Disp: 90 tablet, Rfl: 3   Loperamide HCl (IMODIUM PO), Take 1 tablet by mouth as needed., Disp: , Rfl:    losartan (COZAAR) 100 MG tablet, Take 100 mg by mouth daily., Disp: , Rfl:    Multiple Vitamin (MULTI-VITAMINS) TABS, Take by mouth., Disp: , Rfl:    NON FORMULARY, at bedtime. CPAP @ 11 cm H2O with Oxygen, Disp: , Rfl:    pantoprazole (  PROTONIX) 40 MG tablet, Take 1 tablet by mouth 2 (two) times daily., Disp: , Rfl:    pravastatin (PRAVACHOL) 80 MG tablet, Take 80 mg by mouth daily., Disp: , Rfl:    pregabalin (LYRICA) 50 MG capsule, Take 50 mg by mouth 2 (two) times daily., Disp: , Rfl:    sucralfate (CARAFATE) 1 g tablet, Take by mouth., Disp: , Rfl:    Past Medical History:  Diagnosis Date   Allergy    Anxiety    Arthritis    neck, hands, lower back   Bilateral dry eyes    Chronic low back pain 12/23/2016   Coronary artery  disease    DDD (degenerative disc disease), lumbar 10/02/2014   Dental crowns present    Implants, front "flipper" while waiting for other implants   Emphysema of lung (HCC)    Fibromyalgia    Fibromyalgia    Fracture of right foot 01/14/2015   GERD (gastroesophageal reflux disease)    History of Graves' disease    Hyperlipidemia    Hypertension    Hypothyroidism    Myocardial infarction (HCC) 04/22/2006   Osteoarthritis    Other shoulder lesions, right shoulder 04/17/2015   Rheumatoid arthritis (HCC)    Sciatica, left side 01/01/2019   Sleep apnea    CPAP   Status post shoulder surgery 05/27/2015   Thyroid disease    Vertigo    none for approx 9 yrs   Assessment and Plan:  1. Chronic, continuous use of opioids   2. Chronic bilateral low back pain without sciatica   3. Fibromyalgia   4. Facet syndrome, lumbar   5. Chronic pain syndrome   6. Chronic bilateral low back pain with bilateral sciatica   7. Sciatica, left side   8. Pain of both shoulder joints   9. Cervical radiculitis    Based on our conversation today and it is appropriate to refill her medications.  She does not need a renewal on her hydrocodone but continues to do well with the Vicodin 1 tablet at bedtime which does continue to help with her sleep and muscle spasms.  This will be renewed today.  I have encouraged her to continue with efforts at weight loss stretching strengthening and increase activity especially with the warmer weather.  Based on the persistence of the lower extremity sciatica symptoms and her favorable response to epidural steroid injection in the past I think it would be appropriate to schedule her for an epidural injection.  She has done very well with these in the past getting sustained relief lasting 3 to 4 months before she has recurrence of her baseline pain.  Will schedule this for the next available date likely in 3 weeks.  Continue follow-up with her primary care physician for baseline  medical care with return to clinic scheduled in 2 months. Follow Up Instructions:    I discussed the assessment and treatment plan with the patient. The patient was provided an opportunity to ask questions and all were answered. The patient agreed with the plan and demonstrated an understanding of the instructions.   The patient was advised to call back or seek an in-person evaluation if the symptoms worsen or if the condition fails to improve as anticipated.  I provided 30 minutes of non-face-to-face time during this encounter.   Yevette Edwards, MD

## 2023-07-14 ENCOUNTER — Ambulatory Visit: Payer: Medicare Other | Admitting: Emergency Medicine

## 2023-07-14 VITALS — Ht 68.0 in | Wt 175.0 lb

## 2023-07-14 DIAGNOSIS — Z0000 Encounter for general adult medical examination without abnormal findings: Secondary | ICD-10-CM | POA: Diagnosis not present

## 2023-07-14 NOTE — Patient Instructions (Addendum)
 Carrie Myers , Thank you for taking time to come for your Medicare Wellness Visit. I appreciate your ongoing commitment to your health goals. Please review the following plan we discussed and let me know if I can assist you in the future.   Referrals/Orders/Follow-Ups/Clinician Recommendations: Call [facility] @ [phone] to schedule a mammogram at your earliest convenience. Get the tetanus shot at your local pharmacy.  This is a list of the screening recommended for you and due dates:  Health Maintenance  Topic Date Due   Zoster (Shingles) Vaccine (1 of 2) Never done   DTaP/Tdap/Td vaccine (2 - Td or Tdap) 09/07/2021   Mammogram  11/05/2022   COVID-19 Vaccine (3 - 2024-25 season) 11/14/2022   Flu Shot  10/14/2023   Medicare Annual Wellness Visit  07/13/2024   DEXA scan (bone density measurement)  05/28/2025   Pneumonia Vaccine  Completed   Hepatitis C Screening  Completed   HPV Vaccine  Aged Out   Meningitis B Vaccine  Aged Out   Colon Cancer Screening  Discontinued    Advanced directives: (Copy Requested) Please bring a copy of your health care power of attorney and living will to the office to be added to your chart at your convenience. You can mail to [facility] [address] [place], [place] [address] or email to [email or web address]  Next Medicare Annual Wellness Visit scheduled for next year: Yes, 07/26/24 @ 1:10pm (phone visit)  Fall Prevention in the Home, Adult Falls can cause injuries and affect people of all ages. There are many simple things that you can do to make your home safe and to help prevent falls. If you need it, ask for help making these changes. What actions can I take to prevent falls? General information Use good lighting in all rooms. Make sure to: Replace any light bulbs that burn out. Turn on lights if it is dark and use night-lights. Keep items that you use often in easy-to-reach places. Lower the shelves  around your home if needed. Move furniture so that there are clear paths around it. Do not keep throw rugs or other things on the floor that can make you trip. If any of your floors are uneven, fix them. Add color or contrast paint or tape to clearly mark and help you see: Grab bars or handrails. First and last steps of staircases. Where the edge of each step is. If you use a ladder or stepladder: Make sure that it is fully opened. Do not climb a closed ladder. Make sure the sides of the ladder are locked in place. Have someone hold the ladder while you use it. Know where your pets are as you move through your home. What can I do in the bathroom?     Keep the floor dry. Clean up any water  that is on the floor right away. Remove soap buildup in the bathtub or shower. Buildup makes bathtubs and showers slippery. Use non-skid mats or decals on the floor of the bathtub or shower. Attach bath mats securely with double-sided, non-slip rug tape. If you need to sit down while you are in the shower, use a non-slip stool. Install grab bars by the toilet and in the bathtub and shower. Do not use towel bars as grab bars. What can I do in the bedroom? Make sure that you have a light by your bed that is easy to reach. Do not use any sheets or blankets on your bed that hang  to the floor. Have a firm bench or chair with side arms that you can use for support when you get dressed. What can I do in the kitchen? Clean up any spills right away. If you need to reach something above you, use a sturdy step stool that has a grab bar. Keep electrical cables out of the way. Do not use floor polish or wax that makes floors slippery. What can I do with my stairs? Do not leave anything on the stairs. Make sure that you have a light switch at the top and the bottom of the stairs. Have them installed if you do not have them. Make sure that there are handrails on both sides of the stairs. Fix handrails that are  broken or loose. Make sure that handrails are as long as the staircases. Install non-slip stair treads on all stairs in your home if they do not have carpet. Avoid having throw rugs at the top or bottom of stairs, or secure the rugs with carpet tape to prevent them from moving. Choose a carpet design that does not hide the edge of steps on the stairs. Make sure that carpet is firmly attached to the stairs. Fix any carpet that is loose or worn. What can I do on the outside of my home? Use bright outdoor lighting. Repair the edges of walkways and driveways and fix any cracks. Clear paths of anything that can make you trip, such as tools or rocks. Add color or contrast paint or tape to clearly mark and help you see high doorway thresholds. Trim any bushes or trees on the main path into your home. Check that handrails are securely fastened and in good repair. Both sides of all steps should have handrails. Install guardrails along the edges of any raised decks or porches. Have leaves, snow, and ice cleared regularly. Use sand, salt, or ice melt on walkways during winter months if you live where there is ice and snow. In the garage, clean up any spills right away, including grease or oil spills. What other actions can I take? Review your medicines with your health care provider. Some medicines can make you confused or feel dizzy. This can increase your chance of falling. Wear closed-toe shoes that fit well and support your feet. Wear shoes that have rubber soles and low heels. Use a cane, walker, scooter, or crutches that help you move around if needed. Talk with your provider about other ways that you can decrease your risk of falls. This may include seeing a physical therapist to learn to do exercises to improve movement and strength. Where to find more information Centers for Disease Control and Prevention, [employer]: TonerPromos.no [employer] on Aging: BaseRingTones.pl National Institute on Aging:  BaseRingTones.pl Contact a health care provider if: You are afraid of falling at home. You feel weak, drowsy, or dizzy at home. You fall at home. Get help right away if you: Lose consciousness or have trouble moving after a fall. Have a fall that causes a head injury. These symptoms may be an emergency. Get help right away. Call 911. Do not wait to see if the symptoms will go away. Do not drive yourself to the hospital. This information is not intended to replace advice given to you by your health care provider. Make sure you discuss any questions you have with your health care provider. Document Revised: 11/02/2021 Document Reviewed: 11/02/2021 Elsevier Patient Education  2024 Elsevier Inc.  Managing Pain Without Opioids Opioids are strong medicines used  to treat moderate to severe pain. For some people, especially those who have long-term (chronic) pain, opioids may not be the best choice for pain management due to: Side effects like nausea, constipation, and sleepiness. The risk of addiction (opioid use disorder). The longer you take opioids, the greater your risk of addiction. Pain that lasts for more than 3 months is called chronic pain. Managing chronic pain usually requires more than one approach and is often provided by a team of health care providers working together (multidisciplinary approach). Pain management may be done at a pain management center or pain clinic. How to manage pain without the use of opioids Use non-opioid medicines Non-opioid medicines for pain may include: Over-the-counter or prescription non-steroidal anti-inflammatory drugs (NSAIDs). These may be the first medicines used for pain. They work well for muscle and bone pain, and they reduce swelling. Acetaminophen . This over-the-counter medicine may work well for milder pain but not swelling. Antidepressants. These may be used to treat chronic pain. A certain type of antidepressant (tricyclics) is often used. These  medicines are given in lower doses for pain than when used for depression. Anticonvulsants. These are usually used to treat seizures but may also reduce nerve (neuropathic) pain. Muscle relaxants. These relieve pain caused by sudden muscle tightening (spasms). You may also use a pain medicine that is applied to the skin as a patch, cream, or gel (topical analgesic), such as a numbing medicine. These may cause fewer side effects than medicines taken by mouth. Do certain therapies as directed Some therapies can help with pain management. They include: Physical therapy. You will do exercises to gain strength and flexibility. A physical therapist may teach you exercises to move and stretch parts of your body that are weak, stiff, or painful. You can learn these exercises at physical therapy visits and practice them at home. Physical therapy may also involve: Massage. Heat wraps or applying heat or cold to affected areas. Electrical signals that interrupt pain signals (transcutaneous electrical nerve stimulation, TENS). Weak lasers that reduce pain and swelling (low-level laser therapy). Signals from your body that help you learn to regulate pain (biofeedback). Occupational therapy. This helps you to learn ways to function at home and work with less pain. Recreational therapy. This involves trying new activities or hobbies, such as a physical activity or drawing. Mental health therapy, including: Cognitive behavioral therapy (CBT). This helps you learn coping skills for dealing with pain. Acceptance and commitment therapy (ACT) to change the way you think and react to pain. Relaxation therapies, including muscle relaxation exercises and mindfulness-based stress reduction. Pain management counseling. This may be individual, family, or group counseling.  Receive medical treatments Medical treatments for pain management include: Nerve block injections. These may include a pain blocker and  anti-inflammatory medicines. You may have injections: Near the spine to relieve chronic back or neck pain. Into joints to relieve back or joint pain. Into nerve areas that supply a painful area to relieve body pain. Into muscles (trigger point injections) to relieve some painful muscle conditions. A medical device placed near your spine to help block pain signals and relieve nerve pain or chronic back pain (spinal cord stimulation device). Acupuncture. Follow these instructions at home Medicines Take over-the-counter and prescription medicines only as told by your health care provider. If you are taking pain medicine, ask your health care providers about possible side effects to watch out for. Do not drive or use heavy machinery while taking prescription opioid pain medicine. Lifestyle  Do  not use drugs or alcohol to reduce pain. If you drink alcohol, limit how much you have to: 0-1 drink a day for women who are not pregnant. 0-2 drinks a day for men. Know how much alcohol is in a drink. In the [place] one drink equals one 12 oz bottle of beer (355 mL), one 5 oz glass of wine (148 mL), or one 1 oz glass of hard liquor (44 mL). Do not use any products that contain nicotine or tobacco. These products include cigarettes, chewing tobacco, and vaping devices, such as e-cigarettes. If you need help quitting, ask your health care provider. Eat a healthy diet and maintain a healthy weight. Poor diet and excess weight may make pain worse. Eat foods that are high in fiber. These include fresh fruits and vegetables, whole grains, and beans. Limit foods that are high in fat and processed sugars, such as fried and sweet foods. Exercise regularly. Exercise lowers stress and may help relieve pain. Ask your health care provider what activities and exercises are safe for you. If your health care provider approves, join an exercise class that combines movement and stress reduction. Examples include yoga and tai  chi. Get enough sleep. Lack of sleep may make pain worse. Lower stress as much as possible. Practice stress reduction techniques as told by your therapist. General instructions Work with all your pain management providers to find the treatments that work best for you. You are an important member of your pain management team. There are many things you can do to reduce pain on your own. Consider joining an online or in-person support group for people who have chronic pain. Keep all follow-up visits. This is important. Where to find more information You can find more information about managing pain without opioids from: American Academy of Pain Medicine: painmed.org Institute for Chronic Pain: instituteforchronicpain.org American Chronic Pain Association: theacpa.org Contact a health care provider if: You have side effects from pain medicine. Your pain gets worse or does not get better with treatments or home therapy. You are struggling with anxiety or depression. Summary Many types of pain can be managed without opioids. Chronic pain may respond better to pain management without opioids. Pain is best managed when you and a team of health care providers work together. Pain management without opioids may include non-opioid medicines, medical treatments, physical therapy, mental health therapy, and lifestyle changes. Tell your health care providers if your pain gets worse or is not being managed well enough. This information is not intended to replace advice given to you by your health care provider. Make sure you discuss any questions you have with your health care provider. Document Revised: 06/11/2020 Document Reviewed: 06/11/2020 Elsevier Patient Education  2024 [employer].

## 2023-07-14 NOTE — Progress Notes (Signed)
 Subjective:   Carrie Myers is a 78 y.o. who presents for a Medicare Wellness preventive visit.  Visit Complete: Virtual I connected with  Carrie Myers on 07/14/23 by a audio enabled telemedicine application and verified that I am speaking with the correct person using two identifiers.  Patient Location: Home  Provider Location: Home Office  I discussed the limitations of evaluation and management by telemedicine. The patient expressed understanding and agreed to proceed.  Vital Signs: Because this visit was a virtual/telehealth visit, some criteria may be missing or patient reported. Any vitals not documented were not able to be obtained and vitals that have been documented are patient reported.  VideoDeclined- This patient declined Librarian, academic. Therefore the visit was completed with audio only.  Persons Participating in Visit: Patient.  AWV Questionnaire: No: Patient Medicare AWV questionnaire was not completed prior to this visit.  Cardiac Risk Factors include: advanced age (>70men, >91 women);hypertension;dyslipidemia;Other (see comment), Risk factor comments: prediabetic, OSA (cpap)     Objective:    Today's Vitals   07/14/23 1306  Weight: 175 lb (79.4 kg)  Height: 5\' 8"  (1.727 m)  PainSc: 4    Body mass index is 26.61 kg/m.     07/14/2023    1:28 PM 08/17/2022    7:54 AM 06/21/2022    2:45 PM 05/13/2022    1:51 PM 01/26/2022    1:23 PM 01/06/2022    1:43 PM 06/29/2021   11:31 AM  Advanced Directives  Does Patient Have a Medical Advance Directive? Yes Yes Yes Yes Yes Yes Yes  Type of Estate agent of [place];Living will Living will;Healthcare Power of Attorney  Living will Healthcare Power of [place];Living will Healthcare Power of [place];Living will Healthcare Power of [place];Living will  Does patient want to make changes to medical advance directive? No - Patient declined        Copy of  Healthcare Power of Attorney in Chart? No - copy requested No - copy requested     No - copy requested    Current Medications (verified) Outpatient Encounter Medications as of 07/14/2023  Medication Sig   albuterol  (VENTOLIN  HFA) 108 (90 Base) MCG/ACT inhaler Inhale into the lungs.   Armodafinil 150 MG tablet Take 150 mg by mouth daily. For fibromyalgia fatigue   aspirin EC 81 MG tablet Take 81 mg by mouth daily.   cetirizine (ZYRTEC) 10 MG tablet Take 10 mg by mouth daily.   clobetasol  ointment (TEMOVATE ) 0.05 % Apply pea size amount 1-2 times weekly as maintenance   diazepam  (VALIUM ) 5 MG tablet Take 1 tablet (5 mg total) by mouth at bedtime as needed for anxiety.   Diclofenac -miSOPROStol  75-0.2 MG TBEC TAKE (1) TABLET BY MOUTH TWICE DAILY   DULoxetine  (CYMBALTA ) 30 MG capsule Take 3 capsules (90 mg total) by mouth daily.   fluticasone (FLONASE) 50 MCG/ACT nasal spray    HYDROcodone -acetaminophen  (NORCO/VICODIN) 5-325 MG tablet Take 1 tablet by mouth 2 (two) times daily as needed for moderate pain.   levothyroxine (SYNTHROID) 88 MCG tablet TAKE 1 TABLET BY MOUTH DAILY   Loperamide HCl (IMODIUM PO) Take 1 tablet by mouth as needed.   losartan (COZAAR) 100 MG tablet Take 100 mg by mouth daily.   Multiple Vitamin (MULTI-VITAMINS) TABS Take by mouth.   NON FORMULARY at bedtime. CPAP @ 11 cm H2O with Oxygen    pantoprazole (PROTONIX) 40 MG tablet Take 1 tablet by mouth 2 (two) times daily.   pravastatin (  PRAVACHOL) 80 MG tablet Take 80 mg by mouth daily.   sucralfate  (CARAFATE ) 1 g tablet Take by mouth.   levocetirizine (XYZAL) 5 MG tablet Take by mouth. (Patient not taking: Reported on 07/14/2023)   pregabalin  (LYRICA ) 50 MG capsule Take 50 mg by mouth 2 (two) times daily. (Patient not taking: Reported on 07/14/2023)   No facility-administered encounter medications on file as of 07/14/2023.    Allergies (verified) Amoxicillin-pot clavulanate, Codeine, Sulfa antibiotics, Tolterodine , Proparacaine,  and Shellfish allergy   History: Past Medical History:  Diagnosis Date   Allergy    Anxiety    Arthritis    neck, hands, lower back   Bilateral dry eyes    Chronic low back pain 12/23/2016   Coronary artery disease    DDD (degenerative disc disease), lumbar 10/02/2014   Dental crowns present    Implants, front "flipper" while waiting for other implants   Emphysema of lung (HCC)    Fibromyalgia    Fibromyalgia    Fracture of right foot 01/14/2015   GERD (gastroesophageal reflux disease)    History of Graves' disease    Hyperlipidemia    Hypertension    Hypothyroidism    Myocardial infarction (HCC) 04/22/2006   Osteoarthritis    Other shoulder lesions, right shoulder 04/17/2015   Rheumatoid arthritis (HCC)    Sciatica, left side 01/01/2019   Sleep apnea    CPAP   Status post shoulder surgery 05/27/2015   Thyroid  disease    Vertigo    none for approx 9 yrs   Past Surgical History:  Procedure Laterality Date   ABDOMINAL HYSTERECTOMY     total   APPENDECTOMY     BREAST BIOPSY Right    neg   BREAST CYST ASPIRATION Left    neg   BREAST EXCISIONAL BIOPSY Right    BUNIONECTOMY Bilateral    CESAREAN SECTION     COLONOSCOPY  2010   normal   COLONOSCOPY WITH PROPOFOL  N/A 04/03/2018   Procedure: COLONOSCOPY WITH BIOPSIES;  Surgeon: Marnee Sink, MD;  Location: [facility];  Service: Endoscopy;  Laterality: N/A;  sleep apnea   CORONARY ANGIOPLASTY WITH STENT PLACEMENT  04/2006   dental implant     [age]   DILATION AND CURETTAGE OF UTERUS     epidural steroid injection  2018   ESOPHAGOGASTRODUODENOSCOPY N/A 10/03/2020   Procedure: ESOPHAGOGASTRODUODENOSCOPY (EGD);  Surgeon: Shane Darling, MD;  Location: [facility];  Service: Endoscopy;  Laterality: N/A;  REQUESTING MORNING AFTER 9 AM   ESOPHAGOGASTRODUODENOSCOPY (EGD) WITH PROPOFOL  N/A 08/17/2022   Procedure: ESOPHAGOGASTRODUODENOSCOPY (EGD) WITH PROPOFOL ;  Surgeon: Shane Darling, MD;  Location: [facility]  ENDOSCOPY;  Service: Endoscopy;  Laterality: N/A;   EYE SURGERY     bilateral cataract extraction   SHOULDER ARTHROSCOPY Right 05/07/2015   Procedure: Extensive arthroscopic debridement and arthroscopic subacromial decompression, right shoulder.                        2.  Steroid injection right thumb CMC joint.;  Surgeon: Elner Hahn, MD;  Location: [facility];  Service: Orthopedics;  Laterality: Right;   TOOTH EXTRACTION     Upper centrals and lateral   Family History  Problem Relation Age of Onset   Stroke Mother    Heart disease Father    Pancreatic cancer Sister    Diabetes Sister    Prostate cancer Brother    Breast cancer Neg Hx    Kidney cancer  Neg Hx    Bladder Cancer Neg Hx    Social History   Socioeconomic History   Marital status: Widowed    Spouse name: Not on file   Number of children: 1   Years of education: 12   Highest education level: High school graduate  Occupational History   Occupation: retired   Tobacco Use   Smoking status: Former    Current packs/day: 0.00    Average packs/day: 1.3 packs/day for 44.0 years (55.0 ttl pk-yrs)    Types: Cigarettes    Start date: 04/22/1962    Quit date: 04/22/2006    Years since quitting: 17.2    Passive exposure: Never   Smokeless tobacco: Never   Tobacco comments:       Vaping Use   Vaping status: Never Used  Substance and Sexual Activity   Alcohol use: Not Currently   Drug use: Never   Sexual activity: Not Currently    Partners: Male    Birth control/protection: None  Other Topics Concern   Not on file  Social History Narrative   Pt lives alone    Social Drivers of Health   Financial Resource Strain: Low Risk  (07/14/2023)   Overall Financial Resource Strain (CARDIA)    Difficulty of Paying Living Expenses: Not hard at all  Food Insecurity: No Food Insecurity (07/14/2023)   Hunger Vital Sign    Worried About Running Out of Food in the Last Year: Never true    Ran Out of Food in the Last Year:  Never true  Transportation Needs: No Transportation Needs (07/14/2023)   PRAPARE - Administrator, Civil Service (Medical): No    Lack of Transportation (Non-Medical): No  Physical Activity: Insufficiently Active (07/14/2023)   Exercise Vital Sign    Days of Exercise per Week: 5 days    Minutes of Exercise per Session: 20 min  Stress: Stress Concern Present (07/14/2023)   [employer] of Occupational Health - Occupational Stress Questionnaire    Feeling of Stress : To some extent  Social Connections: Socially Isolated (07/14/2023)   Social Connection and Isolation Panel [NHANES]    Frequency of Communication with Friends and Family: More than three times a week    Frequency of Social Gatherings with Friends and Family: Three times a week    Attends Religious Services: Never    Active Member of Clubs or Organizations: No    Attends Banker Meetings: Never    Marital Status: Widowed    Tobacco Counseling Counseling given: Not Answered Tobacco comments:      Clinical Intake:  Pre-visit preparation completed: Yes  Pain : 0-10 Pain Score: 4  Pain Type: Chronic pain Pain Location: Generalized (fibromyalgia) Pain Descriptors / Indicators: Aching     BMI - recorded: 26.61 Nutritional Status: BMI 25 -29 Overweight Nutritional Risks: None Diabetes: No  Lab Results  Component Value Date   HGBA1C 6.4 (H) 05/18/2023   HGBA1C 6.3 (H) 04/21/2022   HGBA1C 6.1 (H) 09/05/2020     How often do you need to have someone help you when you read instructions, pamphlets, or other written materials from your doctor or pharmacy?: 1 - Never  Interpreter Needed?: No  Information entered by :: Jaunita Messier, CMA   Activities of Daily Living     07/14/2023    1:11 PM  In your present state of health, do you have any difficulty performing the following activities:  Hearing? 1  Comment wears hearing aids  Vision? 0  Difficulty concentrating or making decisions? 0   Walking or climbing stairs? 1  Comment due to fibromyalgia, uses a walking stick when outside  Dressing or bathing? 0  Doing errands, shopping? 0  Preparing Food and eating ? N  Using the Toilet? N  In the past six months, have you accidently leaked urine? Y  Comment wears depends  Do you have problems with loss of bowel control? N  Managing your Medications? N  Managing your Finances? N  Housekeeping or managing your Housekeeping? N    Patient Care Team: Sheron Dixons, MD as PCP - General (Internal Medicine) Cleta Daisy as Physician Assistant (Urology) Elta Halter, MD (Dermatology) Zula Hitch, MD as Consulting Physician (Anesthesiology) Erskin Hearing, MD as Consulting Physician (Pulmonary Disease) Shane Darling, MD as Consulting Physician (Gastroenterology) [facility] (Ophthalmology) Bob Burn Odessa Bene, MD as Consulting Physician (Cardiology) Renda Carpen, [place] (Otolaryngology)  Indicate any recent Medical Services you may have received from other than [facility] providers in the past year (date may be approximate).     Assessment:   This is a routine wellness examination for [place].  Hearing/Vision screen Hearing Screening - Comments:: Wears hearing aids Vision Screening - Comments:: Gets eye exams, [facility], [place]   Goals Addressed             This Visit's Progress    Patient Stated       "Stay awake"       Depression Screen     07/14/2023    1:25 PM 05/18/2023    2:45 PM 07/08/2022    9:34 AM 06/21/2022    2:45 PM 05/21/2022    3:42 PM 05/13/2022    1:50 PM 04/21/2022   10:58 AM  PHQ 2/9 Scores  PHQ - 2 Score 0 0 0 0 0 0 3  PHQ- 9 Score 4 9 0  1  12    Fall Risk     07/14/2023    1:31 PM 05/18/2023    2:45 PM 07/08/2022    9:36 AM 06/21/2022    2:45 PM 05/13/2022    1:50 PM  Fall Risk   Falls in the past year? 1 1 1  0 0  Number falls in past yr: 0 0 0    Injury with Fall? 0 0 0    Risk for fall  due to : History of fall(s);Impaired balance/gait;Orthopedic patient No Fall Risks History of fall(s)    Follow up Falls prevention discussed;Falls evaluation completed;Education provided Falls evaluation completed Falls prevention discussed;Falls evaluation completed      MEDICARE RISK AT HOME:  Medicare Risk at Home Any stairs in or around the home?: Yes (1 step) If so, are there any without handrails?: Yes Home free of loose throw rugs in walkways, pet beds, electrical cords, etc?: Yes Adequate lighting in your home to reduce risk of falls?: Yes Life alert?: Yes (watch) Use of a cane, walker or w/c?: Yes (uses a walking stick when outside) Grab bars in the bathroom?: Yes Shower chair or bench in shower?: No Elevated toilet seat or a handicapped toilet?: No  TIMED UP AND GO:  Was the test performed?  No  Cognitive Function: 6CIT completed        07/14/2023    1:33 PM 07/08/2022    9:42 AM 05/07/2019    2:25 PM 04/17/2018   11:30 AM 03/18/2017    9:57 AM  6CIT Screen  What Year? 0 points 0 points 0 points 0 points 0 points  What month? 0 points 0 points 0 points 0 points 0 points  What time? 0 points 0 points 0 points 0 points 0 points  Count back from 20 0 points 0 points 0 points 0 points 0 points  Months in reverse 0 points 0 points 0 points 0 points 0 points  Repeat phrase 0 points 0 points 0 points 0 points 2 points  Total Score 0 points 0 points 0 points 0 points 2 points    Immunizations Immunization History  Administered Date(s) Administered   Fluad Quad(high Dose 65+) 03/28/2019, 03/28/2020, 03/31/2021   Influenza, High Dose Seasonal PF 12/31/2016, 12/05/2017   Moderna Sars-Covid-2 Vaccination 11/09/2019, 12/13/2019   Pneumococcal Conjugate-13 01/30/2014   Pneumococcal Polysaccharide-23 09/22/2012   Tdap 09/08/2011    Screening Tests Health Maintenance  Topic Date Due   Zoster Vaccines- Shingrix (1 of 2) Never done   DTaP/Tdap/Td (2 - Td or Tdap) 09/07/2021    MAMMOGRAM  11/05/2022   COVID-19 Vaccine (3 - 2024-25 season) 11/14/2022   INFLUENZA VACCINE  10/14/2023   Medicare Annual Wellness (AWV)  07/13/2024   DEXA SCAN  05/28/2025   Pneumonia Vaccine 50+ Years old  Completed   Hepatitis C Screening  Completed   HPV VACCINES  Aged Out   Meningococcal B Vaccine  Aged Out   Colonoscopy  Discontinued    Health Maintenance  Health Maintenance Due  Topic Date Due   Zoster Vaccines- Shingrix (1 of 2) Never done   DTaP/Tdap/Td (2 - Td or Tdap) 09/07/2021   MAMMOGRAM  11/05/2022   COVID-19 Vaccine (3 - 2024-25 season) 11/14/2022   Health Maintenance Items Addressed: See Nurse Notes  Additional Screening:  Vision Screening: Recommended annual ophthalmology exams for early detection of glaucoma and other disorders of the eye.  Dental Screening: Recommended annual dental exams for proper oral hygiene  Community Resource Referral / Chronic Care Management: CRR required this visit?  No   CCM required this visit?  No     Plan:     I have personally reviewed and noted the following in the patient's chart:   Medical and social history Use of alcohol, tobacco or illicit drugs  Current medications and supplements including opioid prescriptions. Patient is currently taking opioid prescriptions. Information provided to patient regarding non-opioid alternatives. Patient advised to discuss non-opioid treatment plan with their provider. Functional ability and status Nutritional status Physical activity Advanced directives List of other physicians Hospitalizations, surgeries, and ER visits in previous 12 months Vitals Screenings to include cognitive, depression, and falls Referrals and appointments  In addition, I have reviewed and discussed with patient certain preventive protocols, quality metrics, and best practice recommendations. A written personalized care plan for preventive services as well as general preventive health recommendations  were provided to patient.     Jaunita Messier, CMA   07/14/2023   After Visit Summary: (MyChart) Due to this being a telephonic visit, the after visit summary with patients personalized plan was offered to patient via MyChart   Notes: Please refer to Routing Comments.

## 2023-07-19 ENCOUNTER — Ambulatory Visit
Admission: RE | Admit: 2023-07-19 | Discharge: 2023-07-19 | Disposition: A | Discharge status: Home / Self Care | Source: Ambulatory Visit | Attending: Anesthesiology | Admitting: Anesthesiology

## 2023-07-19 ENCOUNTER — Other Ambulatory Visit: Payer: Self-pay | Admitting: Anesthesiology

## 2023-07-19 ENCOUNTER — Encounter: Payer: Self-pay | Admitting: Anesthesiology

## 2023-07-19 ENCOUNTER — Ambulatory Visit (HOSPITAL_BASED_OUTPATIENT_CLINIC_OR_DEPARTMENT_OTHER): Admitting: Anesthesiology

## 2023-07-19 VITALS — BP 159/97 | HR 86 | Temp 97.9°F | Resp 15 | Ht 68.0 in | Wt 175.0 lb

## 2023-07-19 DIAGNOSIS — M47812 Spondylosis without myelopathy or radiculopathy, cervical region: Secondary | ICD-10-CM

## 2023-07-19 DIAGNOSIS — R053 Chronic cough: Secondary | ICD-10-CM | POA: Diagnosis not present

## 2023-07-19 DIAGNOSIS — M542 Cervicalgia: Secondary | ICD-10-CM | POA: Diagnosis present

## 2023-07-19 DIAGNOSIS — M25511 Pain in right shoulder: Secondary | ICD-10-CM | POA: Diagnosis present

## 2023-07-19 DIAGNOSIS — M5441 Lumbago with sciatica, right side: Secondary | ICD-10-CM | POA: Diagnosis not present

## 2023-07-19 DIAGNOSIS — M797 Fibromyalgia: Secondary | ICD-10-CM | POA: Diagnosis present

## 2023-07-19 DIAGNOSIS — M5442 Lumbago with sciatica, left side: Secondary | ICD-10-CM

## 2023-07-19 DIAGNOSIS — Z87891 Personal history of nicotine dependence: Secondary | ICD-10-CM | POA: Diagnosis not present

## 2023-07-19 DIAGNOSIS — G894 Chronic pain syndrome: Secondary | ICD-10-CM | POA: Insufficient documentation

## 2023-07-19 DIAGNOSIS — M25512 Pain in left shoulder: Secondary | ICD-10-CM | POA: Diagnosis present

## 2023-07-19 DIAGNOSIS — R457 State of emotional shock and stress, unspecified: Secondary | ICD-10-CM | POA: Diagnosis not present

## 2023-07-19 DIAGNOSIS — G4733 Obstructive sleep apnea (adult) (pediatric): Secondary | ICD-10-CM | POA: Diagnosis not present

## 2023-07-19 DIAGNOSIS — M5136 Other intervertebral disc degeneration, lumbar region with discogenic back pain only: Secondary | ICD-10-CM

## 2023-07-19 DIAGNOSIS — M549 Dorsalgia, unspecified: Secondary | ICD-10-CM | POA: Diagnosis not present

## 2023-07-19 DIAGNOSIS — M5432 Sciatica, left side: Secondary | ICD-10-CM | POA: Insufficient documentation

## 2023-07-19 DIAGNOSIS — M47816 Spondylosis without myelopathy or radiculopathy, lumbar region: Secondary | ICD-10-CM | POA: Insufficient documentation

## 2023-07-19 DIAGNOSIS — F1190 Opioid use, unspecified, uncomplicated: Secondary | ICD-10-CM | POA: Insufficient documentation

## 2023-07-19 DIAGNOSIS — G8929 Other chronic pain: Secondary | ICD-10-CM | POA: Diagnosis present

## 2023-07-19 DIAGNOSIS — M5412 Radiculopathy, cervical region: Secondary | ICD-10-CM | POA: Insufficient documentation

## 2023-07-19 DIAGNOSIS — R52 Pain, unspecified: Secondary | ICD-10-CM

## 2023-07-19 DIAGNOSIS — J449 Chronic obstructive pulmonary disease, unspecified: Secondary | ICD-10-CM | POA: Diagnosis not present

## 2023-07-19 DIAGNOSIS — K2100 Gastro-esophageal reflux disease with esophagitis, without bleeding: Secondary | ICD-10-CM | POA: Diagnosis not present

## 2023-07-19 MED ORDER — DIAZEPAM 5 MG PO TABS: 5.0000 mg | ORAL_TABLET | Freq: Every evening | ORAL | 2 refills | Status: DC | PRN

## 2023-07-19 MED ORDER — IOHEXOL 180 MG/ML  SOLN
10.0000 mL | Freq: Once | INTRAMUSCULAR | Status: AC | PRN
  Administered 2023-07-19: 10 mL via EPIDURAL
  Filled 2023-07-19: qty 20

## 2023-07-19 MED ORDER — CELECOXIB 200 MG PO CAPS: 200.0000 mg | ORAL_CAPSULE | Freq: Every day | ORAL | 1 refills | Status: AC

## 2023-07-19 MED ORDER — SODIUM CHLORIDE 0.9% FLUSH
10.0000 mL | Freq: Once | INTRAVENOUS | Status: AC
Start: 2023-07-19 — End: 2023-07-19
  Administered 2023-07-19: 10 mL

## 2023-07-19 MED ORDER — SODIUM CHLORIDE (PF) 0.9 % IJ SOLN
INTRAMUSCULAR | Status: AC
  Filled 2023-07-19: qty 10

## 2023-07-19 MED ORDER — LIDOCAINE HCL (PF) 1 % IJ SOLN
5.0000 mL | Freq: Once | INTRAMUSCULAR | Status: AC
  Administered 2023-07-19: 5 mL via SUBCUTANEOUS
  Filled 2023-07-19: qty 5

## 2023-07-19 MED ORDER — ROPIVACAINE HCL 2 MG/ML IJ SOLN
10.0000 mL | Freq: Once | INTRAMUSCULAR | Status: AC
  Administered 2023-07-19: 1 mL via EPIDURAL
  Filled 2023-07-19: qty 20

## 2023-07-19 MED ORDER — TRIAMCINOLONE ACETONIDE 40 MG/ML IJ SUSP
40.0000 mg | Freq: Once | INTRAMUSCULAR | Status: AC
  Administered 2023-07-19: 40 mg
  Filled 2023-07-19: qty 1

## 2023-07-19 NOTE — Progress Notes (Signed)
 DIVISION OF PULMONARY AND CRITICAL CARE MEDICINE                              FOLLOW UP ENCOUNTER     Chief complaint: COPD and OSA overlap  History of Present Illness Carrie Myers is a 78 year old female with COPD and fibromyalgia who presents with sinus issues and dizziness.  She is experiencing significant sinus issues and dizziness, which she attributes to severe pollen exposure this year. She has taken sinus medication earlier in the day, but it has not alleviated her symptoms.  She has a persistent cough, which she believes is related to drainage and severe acid reflux rather than lung issues. She describes a 'fuzz' in her throat that precedes the cough, distinguishing it from drainage-related coughing. She has been managing this with medication but finds it challenging to fully clear up the symptoms.  She has a history of smoking, having quit 18 years ago, but for insurance purposes, she reports it as 14 years. She previously had a CT scan for lung nodules two and a half years ago and acknowledges the need for a follow-up scan.  She is currently taking Nuvigil for energy but reports it is not fully effective. She experiences non-restorative sleep, feeling exhausted upon waking. She attributes this to stress related to her brother's recent passing and the responsibilities she has undertaken as his caregiver. She also experiences significant back pain, which limits her ability to perform daily activities like cooking.  Her past medical history includes fibromyalgia, which exacerbates her pain and fatigue, and COPD. She uses oxygen  at night and a CPAP machine.  She takes medication for acid reflux and is mindful of the timing of her doses to avoid interference with her meals.   Past Medical History:   Past Medical History:  Diagnosis Date  . Acute myocardial infarction of anterolateral wall, initial episode of care (CMS/HHS-HCC)   . Coronary  artery disease   . Degenerative arthritis   . Encounter for blood transfusion   . Fibromyalgia   . GERD (gastroesophageal reflux disease)   . History of chickenpox   . History of Graves' disease   . History of motion sickness   . History of spinal stenosis   . Hyperlipidemia   . Hypertension   . Hypothyroidism   . Myocardial infarction (CMS/HHS-HCC)   . Sleep apnea    cpap machine  . Vertigo    one episode    Past Surgical History:   Past Surgical History:  Procedure Laterality Date  . extensive arthroscopic debridment and arthroscopic subacromial decompression, right  Right 05/07/2015   Dr.Poggi   . steroid injection right thumb CMC joint  Right 05/07/2015   Dr.Poggi   . EXTRACTION CATARACT EXTRACAPSULAR W/INSERTION INTRAOCULAR PROSTHESIS Right 05/23/2017   Procedure: R - LENSX / ORA - Cataract Extraction with intraocular lens implantation, right eye;  Surgeon: Charlanne Peggy Rakers, MD;  Location: [facility] OR;  Service: Ophthalmology;  Laterality: Right;  . EXTRACTION CATARACT EXTRACAPSULAR W/INSERTION INTRAOCULAR PROSTHESIS Left 06/06/2017   Procedure: -L- Lensx/ORA -Cataract Extraction with intraocular lens implantation, left eye;  Surgeon: Charlanne Peggy Rakers, MD;  Location: [facility] OR;  Service: Ophthalmology;  Laterality: Left;  . EGD  10/03/2020   Gastritis/Normal EGD biopsies/Repeat  as needed/CTL  . EGD @ [facility]  08/17/2022   Small hiatal hernia/Bilious gastric fluid/Perform esophageal manometry if symptoms persist/Repeat PRN/CTL  . APPENDECTOMY    . Bilateral foot surgery    . Cardiac cath    . Cysts removed from right breast x2    . D & C x3    . HYSTERECTOMY    . Left ovary removed    . PCI and stent placement LAD    . R. knee fluid removal    . Right ovary removed      Allergies:   Allergies  Allergen Reactions  . Amoxicillin-Pot Clavulanate Nausea And Vomiting  . Codeine Nausea And Vomiting  . Detrol  [Tolterodine ] Diarrhea  . Fentanyl  Other (See  Comments)    fentanyl   . Proparacaine Other (See Comments)    Itchy eyes   . Sulfa (Sulfonamide Antibiotics) Nausea And Vomiting  . Codeine Phosphate Nausea  . Shellfish Containing Products Nausea and Nausea And Vomiting    No issues with iodine    Current Medications:   Prior to Admission medications   Medication Sig Taking? Last Dose  albuterol  MDI, PROVENTIL , VENTOLIN , PROAIR , HFA 90 mcg/actuation inhaler Inhale 2 inhalations into the lungs every 6 (six) hours as needed for Wheezing for up to 90 days Yes PRN Not Currently Taking  armodafiniL (NUVIGIL) 150 mg tablet Take 1 tablet by mouth once daily Yes Taking  aspirin 81 MG EC tablet Take 81 mg by mouth once daily. Yes Taking  citalopram  (CELEXA ) 20 MG tablet Take 20 mg by mouth once daily Yes Taking  clobetasoL  (TEMOVATE ) 0.05 % ointment Apply pea size amount 1-2 times weekly as maintenance Yes Taking  diazepam  (VALIUM ) 5 MG tablet Take 5 mg by mouth at bedtime Reported on 06/25/2015 Yes Taking  diclofenac  (VOLTAREN) 75 MG EC tablet Take 75 mg by mouth 2 (two) times daily Yes Taking  diclofenac -misoprostoL  (ARTHROTEC 75) 75-200 mg-mcg DR tablet Take 1 tablet by mouth 2 (two) times daily Yes Taking  DULoxetine  (CYMBALTA ) 30 MG DR capsule Take 30 mg by mouth as needed Yes PRN Not Currently Taking  DULoxetine  (CYMBALTA ) 60 MG DR capsule Take 1 capsule by mouth as needed Yes PRN Not Currently Taking  HYDROcodone -acetaminophen  (NORCO) 5-325 mg tablet Take 1-2 tablets by mouth every 6 (six) hours as needed for Pain. Can take up to 2 tabs every 6 hours for pain. Patient taking differently: Take 1 tablet by mouth once daily Yes PRN Not Currently Taking  levocetirizine (XYZAL) 5 MG tablet Take 5 mg by mouth every evening Yes Taking  levothyroxine (SYNTHROID, LEVOTHROID) 88 MCG tablet Take 88 mcg by mouth once daily   Yes Taking  loperamide (IMODIUM A-D) 2 mg tablet Take by mouth 4 (four) times daily as needed Yes Taking  losartan (COZAAR) 100  MG tablet Take 1 tablet (100 mg total) by mouth once daily Yes Taking  multivitamin tablet Take 1 tablet by mouth once daily.   Yes Taking  MYRBETRIQ  50 mg ER tablet Take 1 tablet by mouth once daily Yes Taking  ofloxacin (OCUFLOX) 0.3 % ophthalmic solution Place 1 drop into the right eye 4 (four) times daily Please begin after procedure. Yes Taking  pantoprazole (PROTONIX) 40 MG DR tablet Take 1 tablet (40 mg total) by mouth 2 (two) times daily before meals Take 30 min before meals Yes Taking  pravastatin (PRAVACHOL) 80 MG tablet TAKE 1 TABLET BY MOUTH DAILY Yes Taking  prednisoLONE acetate (PRED FORTE) 1 % ophthalmic  suspension Place 1 drop into the right eye 3 (three) times daily Yes Taking  prednisoLONE acetate (PRED FORTE) 1 % ophthalmic suspension Place 1 drop into the right eye 4 (four) times daily Please begin after procedure. Yes Taking  predniSONE  (DELTASONE ) 20 MG tablet Take 1 tablet (20 mg total) by mouth once daily Yes Taking  pregabalin  (LYRICA ) 50 MG capsule Take 50 mg by mouth as needed Yes PRN Not Currently Taking  sucralfate  (CARAFATE ) 1 gram tablet Take 1 tablet (1 g total) by mouth 3 (three) times daily Yes Taking  tolterodine  (DETROL  LA) 4 MG LA capsule Take 4 mg by mouth once daily Yes Taking  armodafiniL (NUVIGIL) 150 mg tablet Take 1 tablet (150 mg total) by mouth once daily for 90 days Patient not taking: Reported on 07/19/2023  Not Taking    Family History:   Family History  Problem Relation Name Age of Onset  . High blood pressure (Hypertension) Mother    . Stroke Mother    . High blood pressure (Hypertension) Father    . Myocardial Infarction (Heart attack) Father    . Arthritis Father    . Seizures Brother    . Thyroid  disease Other         sibling    Social History:   Social History   Socioeconomic History  . Marital status: Widowed  Tobacco Use  . Smoking status: Former  . Smokeless tobacco: Never  Vaping Use  . Vaping status: Never Used   Substance and Sexual Activity  . Alcohol use: Yes    Comment: Occasional  . Drug use: No  . Sexual activity: Defer   Social Drivers of Health   Financial Resource Strain: Low Risk  (07/14/2023)   Received from [facility]   Overall Financial Resource Strain (CARDIA)   . Difficulty of Paying Living Expenses: Not hard at all  Food Insecurity: No Food Insecurity (07/14/2023)   Received from [facility]   Hunger Vital Sign   . Worried About Programme researcher, broadcasting/film/video in the Last Year: Never true   . Ran Out of Food in the Last Year: Never true  Transportation Needs: No Transportation Needs (07/14/2023)   Received from [facility] - Transportation   . Lack of Transportation (Medical): No   . Lack of Transportation (Non-Medical): No  Physical Activity: Insufficiently Active (07/14/2023)   Received from [facility]   Exercise Vital Sign   . Days of Exercise per Week: 5 days   . Minutes of Exercise per Session: 20 min  Stress: Stress Concern Present (07/14/2023)   Received from [facility] of Occupational Health - Occupational Stress Questionnaire   . Feeling of Stress : To some extent  Social Connections: Socially Isolated (07/14/2023)   Received from [facility]   Social Connection and Isolation Panel [NHANES]   . Frequency of Communication with Friends and Family: More than three times a week   . Frequency of Social Gatherings with Friends and Family: Three times a week   . Attends Religious Services: Never   . Active Member of Clubs or Organizations: No   . Attends Banker Meetings: Never   . Marital Status: Widowed  Housing Stability: Unknown (07/19/2023)   Housing Stability Vital Sign   . Homeless in the Last Year: No    Review of Systems:   A 10 point review of systems is negative, except for the pertinent positives and negatives detailed in  the HPI.  Vitals:   Vitals:   07/19/23 1323  BP: 136/82  Pulse: 100  SpO2: 99%  Weight: 79.6 kg  (175 lb 6.4 oz)  Height: 172.7 cm (5' 8)     Body mass index is 26.67 kg/m.  Physical Exam:  Physical Exam Vitals and nursing note reviewed.  Constitutional:      General: She is not in acute distress.    Appearance: Normal appearance. She is not ill-appearing, toxic-appearing or diaphoretic.  HENT:     Head: Normocephalic and atraumatic.     Right Ear: External ear normal.     Left Ear: External ear normal.  Eyes:     General:        Right eye: No discharge.        Left eye: No discharge.     Extraocular Movements: Extraocular movements intact.     Pupils: Pupils are equal, round, and reactive to light.  Cardiovascular:     Rate and Rhythm: Normal rate and regular rhythm.     Pulses: Normal pulses.     Heart sounds: Normal heart sounds. No murmur heard.    No friction rub. No gallop.  Abdominal:     General: Bowel sounds are normal.  Skin:    General: Skin is warm and dry.     Capillary Refill: Capillary refill takes less than 2 seconds.  Neurological:     Mental Status: She is alert.     Lab and Imaging Results:  Results     Assessment and Plan:   Diagnoses and all orders for this visit:  Chronic obstructive pulmonary disease, unspecified COPD type (CMS/HHS-HCC)  OSA (obstructive sleep apnea)  Chronic cough  Gastroesophageal reflux disease with esophagitis, unspecified whether hemorrhage  Emotional stress  Back pain without sciatica    Assessment & Plan Chronic Obstructive Pulmonary Disease (COPD) COPD with smoking-related lung injury. Lung function tests show 100% functionality despite emphysema, indicating sufficient lung capacity for current needs. Continued nighttime oxygen  therapy is recommended to prolong life expectancy. CT chest is needed to evaluate lung nodules, with insurance considerations requiring documentation of smoking cessation within the last 15 years. - Order CT chest to evaluate lung nodules - Document smoking cessation as 14  years ago for insurance purposes - Continue nighttime oxygen  therapy - Use inhaler as needed to prevent mucus plugging  Gastroesophageal Reflux Disease (GERD) Severe GERD contributing to chronic cough. Symptoms include throat irritation and cough triggered by acid reflux rather than food regurgitation. She is aware of the connection between GERD and cough. - Continue current GERD medication regimen - Take GERD medication in the morning with other medications  Daytime Somnolence Daytime somnolence potentially exacerbated by late dosing of Nuvigil. Current stress and fibromyalgia may also contribute to fatigue. Adjusting medication timing is necessary to improve sleep quality. - Take Nuvigil early in the morning, ideally by 5-6 AM - Consider adjusting sleep routine to accommodate earlier medication timing  Fibromyalgia Fibromyalgia exacerbated by stress and contributing to fatigue and pain. Current stressors include recent bereavement and responsibilities related to brother's estate. Pain management is challenging due to reluctance to use narcotics. - Focus on stress management to reduce fibromyalgia flare-ups - Consider non-narcotic pain management options   I spent a total of in both face-to-face and non-face-to-face activities, excluding procedures performed, for this visit on the date of this encounter.     This note has been created using dictation software tool and any typographical errors are purely  unintentional.  Patient received an After Visit Summary

## 2023-07-19 NOTE — Patient Instructions (Signed)
____________________________________________________________________________________________  Post-Procedure Discharge Instructions  Instructions: Apply ice:  Purpose: This will minimize any swelling and discomfort after procedure.  When: Day of procedure, as soon as you get home. How: Fill a plastic sandwich bag with crushed ice. Cover it with a small towel and apply to injection site. How long: (15 min on, 15 min off) Apply for 15 minutes then remove x 15 minutes.  Repeat sequence on day of procedure, until you go to bed. Apply heat:  Purpose: To treat any soreness and discomfort from the procedure. When: Starting the next day after the procedure. How: Apply heat to procedure site starting the day following the procedure. How long: May continue to repeat daily, until discomfort goes away. Food intake: Start with clear liquids (like water) and advance to regular food, as tolerated.  Physical activities: Keep activities to a minimum for the first 8 hours after the procedure. After that, then as tolerated. Driving: If you have received any sedation, be responsible and do not drive. You are not allowed to drive for 24 hours after having sedation. Blood thinner: (Applies only to those taking blood thinners) You may restart your blood thinner 6 hours after your procedure. Insulin: (Applies only to Diabetic patients taking insulin) As soon as you can eat, you may resume your normal dosing schedule. Infection prevention: Keep procedure site clean and dry. Shower daily and clean area with soap and water. Post-procedure Pain Diary: Extremely important that this be done correctly and accurately. Recorded information will be used to determine the next step in treatment. For the purpose of accuracy, follow these rules: Evaluate only the area treated. Do not report or include pain from an untreated area. For the purpose of this evaluation, ignore all other areas of pain, except for the treated area. After  your procedure, avoid taking a long nap and attempting to complete the pain diary after you wake up. Instead, set your alarm clock to go off every hour, on the hour, for the initial 8 hours after the procedure. Document the duration of the numbing medicine, and the relief you are getting from it. Do not go to sleep and attempt to complete it later. It will not be accurate. If you received sedation, it is likely that you were given a medication that may cause amnesia. Because of this, completing the diary at a later time may cause the information to be inaccurate. This information is needed to plan your care. Follow-up appointment: Keep your post-procedure follow-up evaluation appointment after the procedure (usually 2 weeks for most procedures, 6 weeks for radiofrequencies). DO NOT FORGET to bring you pain diary with you.   Expect: (What should I expect to see with my procedure?) From numbing medicine (AKA: Local Anesthetics): Numbness or decrease in pain. You may also experience some weakness, which if present, could last for the duration of the local anesthetic. Onset: Full effect within 15 minutes of injected. Duration: It will depend on the type of local anesthetic used. On the average, 1 to 8 hours.  From steroids (Applies only if steroids were used): Decrease in swelling or inflammation. Once inflammation is improved, relief of the pain will follow. Onset of benefits: Depends on the amount of swelling present. The more swelling, the longer it will take for the benefits to be seen. In some cases, up to 10 days. Duration: Steroids will stay in the system x 2 weeks. Duration of benefits will depend on multiple posibilities including persistent irritating factors. Side-effects: If present, they   may typically last 2 weeks (the duration of the steroids). Frequent: Cramps (if they occur, drink Gatorade and take over-the-counter Magnesium 450-500 mg once to twice a day); water retention with temporary  weight gain; increases in blood sugar; decreased immune system response; increased appetite. Occasional: Facial flushing (red, warm cheeks); mood swings; menstrual changes. Uncommon: Long-term decrease or suppression of natural hormones; bone thinning. (These are more common with higher doses or more frequent use. This is why we prefer that our patients avoid having any injection therapies in other practices.)  Very Rare: Severe mood changes; psychosis; aseptic necrosis. From procedure: Some discomfort is to be expected once the numbing medicine wears off. This should be minimal if ice and heat are applied as instructed.  Call if: (When should I call?) You experience numbness and weakness that gets worse with time, as opposed to wearing off. New onset bowel or bladder incontinence. (Applies only to procedures done in the spine)  Emergency Numbers: Durning business hours (Monday - Thursday, 8:00 AM - 4:00 PM) (Friday, 9:00 AM - 12:00 Noon): [phone] After hours: [phone] NOTE: If you are having a problem and are unable connect with, or to talk to a provider, then go to your nearest urgent care or emergency department. If the problem is serious and urgent, please call 911. ____________________________________________________________________________________________  Epidural Steroid Injection Patient Information  Description: The epidural space surrounds the nerves as they exit the spinal cord.  In some patients, the nerves can be compressed and inflamed by a bulging disc or a tight spinal canal (spinal stenosis).  By injecting steroids into the epidural space, we can bring irritated nerves into direct contact with a potentially helpful medication.  These steroids act directly on the irritated nerves and can reduce swelling and inflammation which often leads to decreased pain.  Epidural steroids may be injected anywhere along the spine and from the neck to the low back depending upon the  location of your pain.   After numbing the skin with local anesthetic (like Novocaine), a small needle is passed into the epidural space slowly.  You may experience a sensation of pressure while this is being done.  The entire block usually last less than 10 minutes.  Conditions which may be treated by epidural steroids:  Low back and leg pain Neck and arm pain Spinal stenosis Post-laminectomy syndrome Herpes zoster (shingles) pain Pain from compression fractures  Preparation for the injection:  Do not eat any solid food or dairy products within 8 hours of your appointment.  You may drink clear liquids up to 3 hours before appointment.  Clear liquids include water, black coffee, juice or soda.  No milk or cream please. You may take your regular medication, including pain medications, with a sip of water before your appointment  Diabetics should hold regular insulin (if taken separately) and take 1/2 normal NPH dos the morning of the procedure.  Carry some sugar containing items with you to your appointment. A driver must accompany you and be prepared to drive you home after your procedure.  Bring all your current medications with your. An IV may be inserted and sedation may be given at the discretion of the physician.   A blood pressure cuff, EKG and other monitors will often be applied during the procedure.  Some patients may need to have extra oxygen administered for a short period. You will be asked to provide medical information, including your allergies, prior to the procedure.  We must know immediately   if you are taking blood thinners (like Coumadin/Warfarin)  Or if you are allergic to IV iodine contrast (dye). We must know if you could possible be pregnant.  Possible side-effects: Bleeding from needle site Infection (rare, may require surgery) Nerve injury (rare) Numbness & tingling (temporary) Difficulty urinating (rare, temporary) Spinal headache ( a headache worse with upright  posture) Light -headedness (temporary) Pain at injection site (several days) Decreased blood pressure (temporary) Weakness in arm/leg (temporary) Pressure sensation in back/neck (temporary)  Call if you experience: Fever/chills associated with headache or increased back/neck pain. Headache worsened by an upright position. New onset weakness or numbness of an extremity below the injection site Hives or difficulty breathing (go to the emergency room) Inflammation or drainage at the infection site Severe back/neck pain Any new symptoms which are concerning to you  Please note:  Although the local anesthetic injected can often make your back or neck feel good for several hours after the injection, the pain will likely return.  It takes 3-7 days for steroids to work in the epidural space.  You may not notice any pain relief for at least that one week.  If effective, we will often do a series of three injections spaced 3-6 weeks apart to maximally decrease your pain.  After the initial series, we generally will wait several months before considering a repeat injection of the same type.  If you have any questions, please call [phone] [facility] 

## 2023-07-20 ENCOUNTER — Telehealth: Payer: Self-pay | Admitting: *Deleted

## 2023-07-20 DIAGNOSIS — I2510 Atherosclerotic heart disease of native coronary artery without angina pectoris: Secondary | ICD-10-CM | POA: Diagnosis not present

## 2023-07-20 DIAGNOSIS — I10 Essential (primary) hypertension: Secondary | ICD-10-CM | POA: Diagnosis not present

## 2023-07-20 DIAGNOSIS — E7800 Pure hypercholesterolemia, unspecified: Secondary | ICD-10-CM | POA: Diagnosis not present

## 2023-07-20 NOTE — Progress Notes (Signed)
 Subjective:  Patient ID: Carrie Myers, female    DOB: December 30, 1945  Age: 78 y.o. MRN: [id number]  CC: Back Pain (lower)   Procedure: L5-S1 epidural steroid under fluoroscopic guidance with no sedation  HPI Carrie Myers presents for reevaluation.  She continues to have severe unremitting pain affecting the left calf and posterior lateral left leg worse than right side.  She has had epidural injections in the past with good relief.  She is having a lot more pain as a recent and cannot get ahead of this.  She is trying to do some occasional stretching but does not know what to do with the exercises.  Additionally, she is having a lot of midline low back pain that is worse with standing and inactivity.  She takes her Valium  at bedtime to help with sleep and muscle spasms overnight.  She is very sparingly using occasional Vicodin for pain relief.  She has not been using anti-inflammatories.  The pain quality is a gnawing aching pain worse with increased activity radiating down the right posterior lateral leg to the right calf.  No significant weakness is reported.  No problems of bowel or bladder function are noted.  Outpatient Medications Prior to Visit  Medication Sig Dispense Refill   Armodafinil 150 MG tablet Take 150 mg by mouth daily. For fibromyalgia fatigue     aspirin EC 81 MG tablet Take 81 mg by mouth daily.     cetirizine (ZYRTEC) 10 MG tablet Take 10 mg by mouth daily.     clobetasol  ointment (TEMOVATE ) 0.05 % Apply pea size amount 1-2 times weekly as maintenance 30 g 1   Diclofenac -miSOPROStol  75-0.2 MG TBEC TAKE (1) TABLET BY MOUTH TWICE DAILY 60 tablet 3   fluticasone (FLONASE) 50 MCG/ACT nasal spray      levocetirizine (XYZAL) 5 MG tablet Take by mouth.     levothyroxine (SYNTHROID) 88 MCG tablet TAKE 1 TABLET BY MOUTH DAILY 90 tablet 3   Loperamide HCl (IMODIUM PO) Take 1 tablet by mouth as needed.     losartan (COZAAR) 100 MG tablet Take 100 mg by mouth daily.      Multiple Vitamin (MULTI-VITAMINS) TABS Take by mouth.     NON FORMULARY at bedtime. CPAP @ 11 cm H2O with Oxygen      pantoprazole (PROTONIX) 40 MG tablet Take 1 tablet by mouth 2 (two) times daily.     pravastatin (PRAVACHOL) 80 MG tablet Take 80 mg by mouth daily.     pregabalin  (LYRICA ) 50 MG capsule Take 50 mg by mouth 2 (two) times daily.     sucralfate  (CARAFATE ) 1 g tablet Take by mouth.     albuterol  (VENTOLIN  HFA) 108 (90 Base) MCG/ACT inhaler Inhale into the lungs.     DULoxetine  (CYMBALTA ) 30 MG capsule Take 3 capsules (90 mg total) by mouth daily. (Patient not taking: Reported on 07/19/2023) 270 capsule 1   HYDROcodone -acetaminophen  (NORCO/VICODIN) 5-325 MG tablet Take 1 tablet by mouth 2 (two) times daily as needed for moderate pain. 45 tablet 0   diazepam  (VALIUM ) 5 MG tablet Take 1 tablet (5 mg total) by mouth at bedtime as needed for anxiety. 30 tablet 2   No facility-administered medications prior to visit.    Review of Systems CNS: No confusion or sedation Cardiac: No angina or palpitations GI: No abdominal pain or constipation Constitutional: No nausea vomiting fevers or chills  Objective:  BP (!) 159/97   Pulse 86   Temp 97.9 F (36.6  C) (Temporal)   Resp 15   Ht 5\' 8"  (1.727 m)   Wt 175 lb (79.4 kg)   SpO2 95%   BMI 26.61 kg/m    BP Readings from Last 3 Encounters:  07/19/23 (!) 159/97  05/18/23 112/68  08/17/22 (!) 151/85     Wt Readings from Last 3 Encounters:  07/19/23 175 lb (79.4 kg)  07/14/23 175 lb (79.4 kg)  05/18/23 176 lb 6 oz (80 kg)     Physical Exam Pt is alert and oriented PERRL EOMI HEART IS RRR no murmur or rub LCTA no wheezing or rales MUSCULOSKELETAL reveals some paraspinous muscle tenderness throughout the lumbar region but no overt trigger points.  She does have some mild pain with extension and left and right lateral rotation while standing in the lumbar region.  She does get back pain with left straight leg raise.  Muscle  tone and bulk is at baseline.  Labs  Lab Results  Component Value Date   HGBA1C 6.4 (H) 05/18/2023   HGBA1C 6.3 (H) 04/21/2022   HGBA1C 6.1 (H) 09/05/2020   Lab Results  Component Value Date   LDLCALC 64 05/18/2023   CREATININE 0.93 05/18/2023    -------------------------------------------------------------------------------------------------------------------- Lab Results  Component Value Date   WBC 5.8 05/18/2023   HGB 12.6 05/18/2023   HCT 38.1 05/18/2023   PLT 303 05/18/2023   GLUCOSE 91 05/18/2023   CHOL 148 05/18/2023   TRIG 146 05/18/2023   HDL 59 05/18/2023   LDLCALC 64 05/18/2023   ALT 21 05/18/2023   AST 25 05/18/2023   NA 144 05/18/2023   K 4.1 05/18/2023   CL 103 05/18/2023   CREATININE 0.93 05/18/2023   BUN 17 05/18/2023   CO2 25 05/18/2023   TSH 1.510 05/18/2023   HGBA1C 6.4 (H) 05/18/2023    --------------------------------------------------------------------------------------------------------------------- DG PAIN CLINIC C-ARM 1-60 MIN NO REPORT Result Date: 07/19/2023 Fluoro was used, but no Radiologist interpretation will be provided. Please refer to "NOTES" tab for provider progress note.    Assessment & Plan:   Carrie Myers was seen today for back pain.  Diagnoses and all orders for this visit:  Chronic, continuous use of opioids  Fibromyalgia  Facet syndrome, lumbar -     Ambulatory referral to Physical Therapy  Chronic pain syndrome  Chronic bilateral low back pain with bilateral sciatica -     Lumbar Epidural Injection -     Ambulatory referral to Physical Therapy  Sciatica, left side -     Lumbar Epidural Injection  Pain of both shoulder joints  Cervical radiculitis  Cervical facet joint syndrome  Degeneration of intervertebral disc of lumbar region with discogenic back pain -     Ambulatory referral to Physical Therapy  Cervicalgia  Other orders -     triamcinolone  acetonide (KENALOG -40) injection 40 mg -     sodium  chloride flush (NS) 0.9 % injection 10 mL -     ropivacaine  (PF) 2 mg/mL (0.2%) (NAROPIN ) injection 10 mL -     iohexol  (OMNIPAQUE ) 180 MG/ML injection 10 mL -     lidocaine  (PF) (XYLOCAINE ) 1 % injection 5 mL -     diazepam  (VALIUM ) 5 MG tablet; Take 1 tablet (5 mg total) by mouth at bedtime as needed for anxiety. -     celecoxib (CELEBREX) 200 MG capsule; Take 1 capsule (200 mg total) by mouth daily.        ----------------------------------------------------------------------------------------------------------------------  Problem List Items Addressed This Visit  Unprioritized   Chronic pain syndrome (Chronic)   Relevant Medications   celecoxib (CELEBREX) 200 MG capsule   Fibromyalgia (Chronic)   Relevant Medications   celecoxib (CELEBREX) 200 MG capsule   Facet syndrome, lumbar   Relevant Medications   celecoxib (CELEBREX) 200 MG capsule   Other Relevant Orders   Ambulatory referral to Physical Therapy   Sciatica, left side   Relevant Medications   diazepam  (VALIUM ) 5 MG tablet (Start on 08/10/2023)   Other Visit Diagnoses       Chronic, continuous use of opioids    -  Primary     Chronic bilateral low back pain with bilateral sciatica       Relevant Medications   triamcinolone  acetonide (KENALOG -40) injection 40 mg (Completed)   ropivacaine  (PF) 2 mg/mL (0.2%) (NAROPIN ) injection 10 mL (Completed)   lidocaine  (PF) (XYLOCAINE ) 1 % injection 5 mL (Completed)   diazepam  (VALIUM ) 5 MG tablet (Start on 08/10/2023)   celecoxib (CELEBREX) 200 MG capsule   Other Relevant Orders   Ambulatory referral to Physical Therapy     Pain of both shoulder joints         Cervical radiculitis       Relevant Medications   diazepam  (VALIUM ) 5 MG tablet (Start on 08/10/2023)     Cervical facet joint syndrome         Degeneration of intervertebral disc of lumbar region with discogenic back pain       Relevant Medications   triamcinolone  acetonide (KENALOG -40) injection 40 mg  (Completed)   celecoxib (CELEBREX) 200 MG capsule   Other Relevant Orders   Ambulatory referral to Physical Therapy     Cervicalgia             ----------------------------------------------------------------------------------------------------------------------  1. Chronic, continuous use of opioids (Primary) Continue with sparing use of hydrocodone  for pain relief but I am also going to have her start Celebrex 200 mg once a day to see if this can help.  2. Fibromyalgia As above and continue with current medication management.  We have discussed options regarding adding in once a day Lyrica  as she has had this in the past and it was helpful for the diffuse body muscle pain.  Also adding a TENS unit application for daily lumbar pain management  3. Facet syndrome, lumbar Schedule her for formal physical therapy as she is not doing any current stretching strengthening exercises to help with her facet or lumbar pain. - Ambulatory referral to Physical Therapy  4. Chronic pain syndrome I have reviewed the [place]  practitioner database information as appropriate.  Continue with Valium  at bedtime dosing.  Continue conservative hydrocodone  utilization.  5. Chronic bilateral low back pain with bilateral sciatica We will proceed with a lumbar epidural today.  We gone over the risks and benefits of the procedure with her in full detail all questions were answered.  Continue with stretching strengthening with physical therapy modalities initiated. - Lumbar Epidural Injection - Ambulatory referral to Physical Therapy  6. Sciatica, left side As above - Lumbar Epidural Injection  7. Pain of both shoulder joints   8. Cervical radiculitis   9. Cervical facet joint syndrome   10. Degeneration of intervertebral disc of lumbar region with discogenic back pain  - Ambulatory referral to Physical Therapy  11.  Cervicalgia     ----------------------------------------------------------------------------------------------------------------------  I am having Carrie Myers start on celecoxib. I am also having her maintain her pravastatin, aspirin EC, Multi-Vitamins, Loperamide HCl (IMODIUM PO), pantoprazole,  Armodafinil, pregabalin , levocetirizine, NON FORMULARY, losartan, HYDROcodone -acetaminophen , albuterol , sucralfate , clobetasol  ointment, DULoxetine , Diclofenac -miSOPROStol , fluticasone, levothyroxine, cetirizine, and diazepam . We administered triamcinolone  acetonide, sodium chloride  flush, ropivacaine  (PF) 2 mg/mL (0.2%), iohexol , and lidocaine  (PF).   Meds ordered this encounter  Medications   triamcinolone  acetonide (KENALOG -40) injection 40 mg   sodium chloride  flush (NS) 0.9 % injection 10 mL   ropivacaine  (PF) 2 mg/mL (0.2%) (NAROPIN ) injection 10 mL   iohexol  (OMNIPAQUE ) 180 MG/ML injection 10 mL   lidocaine  (PF) (XYLOCAINE ) 1 % injection 5 mL   diazepam  (VALIUM ) 5 MG tablet    Sig: Take 1 tablet (5 mg total) by mouth at bedtime as needed for anxiety.    Dispense:  30 tablet    Refill:  2   celecoxib (CELEBREX) 200 MG capsule    Sig: Take 1 capsule (200 mg total) by mouth daily.    Dispense:  30 capsule    Refill:  1   Patient's Medications  New Prescriptions   CELECOXIB (CELEBREX) 200 MG CAPSULE    Take 1 capsule (200 mg total) by mouth daily.  Previous Medications   ALBUTEROL  (VENTOLIN  HFA) 108 (90 BASE) MCG/ACT INHALER    Inhale into the lungs.   ARMODAFINIL 150 MG TABLET    Take 150 mg by mouth daily. For fibromyalgia fatigue   ASPIRIN EC 81 MG TABLET    Take 81 mg by mouth daily.   CETIRIZINE (ZYRTEC) 10 MG TABLET    Take 10 mg by mouth daily.   CLOBETASOL  OINTMENT (TEMOVATE ) 0.05 %    Apply pea size amount 1-2 times weekly as maintenance   DICLOFENAC -MISOPROSTOL  75-0.2 MG TBEC    TAKE (1) TABLET BY MOUTH TWICE DAILY   DULOXETINE  (CYMBALTA ) 30 MG CAPSULE    Take 3  capsules (90 mg total) by mouth daily.   FLUTICASONE (FLONASE) 50 MCG/ACT NASAL SPRAY       HYDROCODONE -ACETAMINOPHEN  (NORCO/VICODIN) 5-325 MG TABLET    Take 1 tablet by mouth 2 (two) times daily as needed for moderate pain.   LEVOCETIRIZINE (XYZAL) 5 MG TABLET    Take by mouth.   LEVOTHYROXINE (SYNTHROID) 88 MCG TABLET    TAKE 1 TABLET BY MOUTH DAILY   LOPERAMIDE HCL (IMODIUM PO)    Take 1 tablet by mouth as needed.   LOSARTAN (COZAAR) 100 MG TABLET    Take 100 mg by mouth daily.   MULTIPLE VITAMIN (MULTI-VITAMINS) TABS    Take by mouth.   NON FORMULARY    at bedtime. CPAP @ 11 cm H2O with Oxygen    PANTOPRAZOLE (PROTONIX) 40 MG TABLET    Take 1 tablet by mouth 2 (two) times daily.   PRAVASTATIN (PRAVACHOL) 80 MG TABLET    Take 80 mg by mouth daily.   PREGABALIN  (LYRICA ) 50 MG CAPSULE    Take 50 mg by mouth 2 (two) times daily.   SUCRALFATE  (CARAFATE ) 1 G TABLET    Take by mouth.  Modified Medications   Modified Medication Previous Medication   DIAZEPAM  (VALIUM ) 5 MG TABLET diazepam  (VALIUM ) 5 MG tablet      Take 1 tablet (5 mg total) by mouth at bedtime as needed for anxiety.    Take 1 tablet (5 mg total) by mouth at bedtime as needed for anxiety.  Discontinued Medications   No medications on file   ----------------------------------------------------------------------------------------------------------------------  Follow-up: Return in about 1 month (around 08/19/2023) for evaluation, med refill.   Procedure: L5-S1 LESI with fluoroscopic guidance and with no moderate sedation  NOTE: The risks, benefits, and expectations of the procedure have been discussed and explained to the patient who was understanding and in agreement with suggested treatment plan. No guarantees were made.  DESCRIPTION OF PROCEDURE: Lumbar epidural steroid injection with no IV Versed , EKG, blood pressure, pulse, and pulse oximetry monitoring. The procedure was performed with the patient in the prone position under  fluoroscopic guidance.  Sterile prep x3 was initiated and I then injected subcutaneous lidocaine  to the overlying L5-S1 site after its fluoroscopic identifictation.  Using strict aseptic technique, I then advanced an 18-gauge Tuohy epidural needle in the midline using interlaminar approach via loss-of-resistance to saline technique. There was negative aspiration for heme or  CSF.  I then confirmed position with both AP and Lateral fluoroscan.  2 cc of contrast dye were injected and a  total of 5 mL of Preservative-Free normal saline mixed with 40 mg of Kenalog  and 1cc Ropicaine 0.2 percent were injected incrementally via the  epidurally placed needle. The needle was removed. The patient tolerated the injection well and was convalesced and discharged to home in stable condition. Should the patient have any post procedure difficulty they have been instructed on how to contact us  for assistance.   Zula Hitch, MD

## 2023-07-20 NOTE — Telephone Encounter (Signed)
 No problems post procedure.

## 2023-07-25 ENCOUNTER — Telehealth: Payer: Self-pay

## 2023-07-25 NOTE — Telephone Encounter (Signed)
 Copied from CRM [id number]. Topic: Clinical - Medication Question >> Jul 25, 2023 12:57 PM Yolanda T wrote: Reason for CRM: patient called to see if she could get the liquid form for sucralfate  (CARAFATE ) 1 g tablet for her acid reflux. Please f/u with patient

## 2023-08-01 ENCOUNTER — Telehealth: Payer: Self-pay

## 2023-08-01 NOTE — Telephone Encounter (Signed)
 Copied from CRM [id number]. Topic: Clinical - Medication Question >> Aug 01, 2023 10:18 AM Carrie Myers wrote: Reason for CRM: Patient called in stating she would like Sucralfate  sus 36m/10ml, patient stated she would like the liquid as she feels  that works better , would like to know if she can get it sent in to the pharmacy so she can begin taking it

## 2023-08-02 NOTE — Telephone Encounter (Signed)
 No answer to phone call to discuss previous message. No VM message left.

## 2023-08-03 NOTE — Telephone Encounter (Signed)
 Mychart message sent to patient.

## 2023-08-03 NOTE — Telephone Encounter (Signed)
 LMOM for patient to call back.  JM

## 2023-08-09 ENCOUNTER — Other Ambulatory Visit: Payer: Self-pay | Admitting: Internal Medicine

## 2023-08-09 DIAGNOSIS — M797 Fibromyalgia: Secondary | ICD-10-CM

## 2023-08-11 ENCOUNTER — Ambulatory Visit
Admission: RE | Admit: 2023-08-11 | Discharge: 2023-08-11 | Disposition: A | Discharge status: Home / Self Care | Source: Ambulatory Visit | Attending: Internal Medicine | Admitting: Internal Medicine

## 2023-08-11 DIAGNOSIS — Z1231 Encounter for screening mammogram for malignant neoplasm of breast: Secondary | ICD-10-CM | POA: Insufficient documentation

## 2023-08-12 NOTE — Telephone Encounter (Signed)
 Requested medication (s) are due for refill today: yes  Requested medication (s) are on the active medication list: yes  Last refill:  03/06/23  Future visit scheduled: yes  Notes to clinic:  Unable to refill per protocol, cannot delegate.      Requested Prescriptions  Pending Prescriptions Disp Refills   Diclofenac -miSOPROStol  75-0.2 MG TBEC [Pharmacy Med Name: DICLOFENAC  SODIUM/MISOPROSTOL  75-0.2MG  TABLET DR] 60 tablet 3    Sig: TAKE (1) TABLET BY MOUTH TWICE DAILY     Not Delegated - Analgesics:  Antirheumatic Agents - diclofenac /misoprostol  Failed - 08/12/2023  8:45 AM      Failed - This refill cannot be delegated      Passed - Cr in normal range and within 360 days    Creatinine, Ser  Date Value Ref Range Status  05/18/2023 0.93 0.57 - 1.00 mg/dL Final         Passed - HGB in normal range and within 360 days    Hemoglobin  Date Value Ref Range Status  05/18/2023 12.6 11.1 - 15.9 g/dL Final         Passed - Patient is not pregnant      Passed - Valid encounter within last 12 months    Recent Outpatient Visits           2 months ago Essential hypertension   [facility], Chales [place], MD       Future Appointments             In 2 months Gala Jubilee, Chales [place], MD [facility] at [facility], [facility]

## 2023-08-15 DIAGNOSIS — J328 Other chronic sinusitis: Secondary | ICD-10-CM | POA: Diagnosis not present

## 2023-08-15 DIAGNOSIS — J302 Other seasonal allergic rhinitis: Secondary | ICD-10-CM | POA: Diagnosis not present

## 2023-08-15 DIAGNOSIS — J3489 Other specified disorders of nose and nasal sinuses: Secondary | ICD-10-CM | POA: Diagnosis not present

## 2023-08-22 ENCOUNTER — Encounter: Payer: Self-pay | Admitting: Anesthesiology

## 2023-08-22 ENCOUNTER — Ambulatory Visit: Attending: Anesthesiology | Admitting: Anesthesiology

## 2023-08-22 DIAGNOSIS — M25512 Pain in left shoulder: Secondary | ICD-10-CM

## 2023-08-22 DIAGNOSIS — F1190 Opioid use, unspecified, uncomplicated: Secondary | ICD-10-CM | POA: Diagnosis not present

## 2023-08-22 DIAGNOSIS — G8929 Other chronic pain: Secondary | ICD-10-CM

## 2023-08-22 DIAGNOSIS — M25511 Pain in right shoulder: Secondary | ICD-10-CM

## 2023-08-22 DIAGNOSIS — G894 Chronic pain syndrome: Secondary | ICD-10-CM

## 2023-08-22 DIAGNOSIS — M797 Fibromyalgia: Secondary | ICD-10-CM

## 2023-08-22 DIAGNOSIS — M5441 Lumbago with sciatica, right side: Secondary | ICD-10-CM

## 2023-08-22 DIAGNOSIS — M47812 Spondylosis without myelopathy or radiculopathy, cervical region: Secondary | ICD-10-CM

## 2023-08-22 DIAGNOSIS — M47816 Spondylosis without myelopathy or radiculopathy, lumbar region: Secondary | ICD-10-CM

## 2023-08-22 DIAGNOSIS — M5412 Radiculopathy, cervical region: Secondary | ICD-10-CM

## 2023-08-22 DIAGNOSIS — M5442 Lumbago with sciatica, left side: Secondary | ICD-10-CM

## 2023-08-22 DIAGNOSIS — M4722 Other spondylosis with radiculopathy, cervical region: Secondary | ICD-10-CM

## 2023-08-22 NOTE — Progress Notes (Signed)
 Virtual Visit via Telephone Note  I connected with Carrie Myers on 08/22/23 at  2:00 PM EDT by telephone and verified that I am speaking with the correct person using two identifiers.  Location: Patient: Home Provider: Pain control center   I discussed the limitations, risks, security and privacy concerns of performing an evaluation and management service by telephone and the availability of in person appointments. I also discussed with the patient that there may be a patient responsible charge related to this service. The patient expressed understanding and agreed to proceed.   History of Present Illness: I spoke with Carrie Myers via telephone as we are unable link with the video portion of the conference and she reports that she is doing reasonably well.  She had an epidural back in May and gained 50 to 70% relief with her low back pain since that time.  She has had about 75 to 80% improvement in her right lower extremity sciatica symptoms and she is experiencing less frequent and less severe pain with pain that radiates to the back of the knee but not into the calf or foot.  She is staying active and is walking 1 and half miles per day with her dog and feels that she is doing well.  She has required no opioids.  She still uses Valium  at bedtime to help with muscle spasm and sleep and this is working well for her.  Otherwise she is in her usual state of health with no new changes in the symptom complex no change in quality characteristic or distribution of her pain other than less severe pain and less diffuse pain.   Observations/Objective:  Current Outpatient Medications:    albuterol  (VENTOLIN  HFA) 108 (90 Base) MCG/ACT inhaler, Inhale into the lungs., Disp: , Rfl:    Armodafinil 150 MG tablet, Take 150 mg by mouth daily. For fibromyalgia fatigue, Disp: , Rfl:    aspirin EC 81 MG tablet, Take 81 mg by mouth daily., Disp: , Rfl:    celecoxib  (CELEBREX ) 200 MG capsule, Take 1 capsule  (200 mg total) by mouth daily., Disp: 30 capsule, Rfl: 1   cetirizine (ZYRTEC) 10 MG tablet, Take 10 mg by mouth daily., Disp: , Rfl:    clobetasol  ointment (TEMOVATE ) 0.05 %, Apply pea size amount 1-2 times weekly as maintenance, Disp: 30 g, Rfl: 1   diazepam  (VALIUM ) 5 MG tablet, Take 1 tablet (5 mg total) by mouth at bedtime as needed for anxiety., Disp: 30 tablet, Rfl: 2   Diclofenac -miSOPROStol  75-0.2 MG TBEC, TAKE (1) TABLET BY MOUTH TWICE DAILY, Disp: 60 tablet, Rfl: 1   DULoxetine  (CYMBALTA ) 30 MG capsule, Take 3 capsules (90 mg total) by mouth daily. (Patient not taking: Reported on 07/19/2023), Disp: 270 capsule, Rfl: 1   fluticasone (FLONASE) 50 MCG/ACT nasal spray, , Disp: , Rfl:    HYDROcodone -acetaminophen  (NORCO/VICODIN) 5-325 MG tablet, Take 1 tablet by mouth 2 (two) times daily as needed for moderate pain., Disp: 45 tablet, Rfl: 0   levocetirizine (XYZAL) 5 MG tablet, Take by mouth., Disp: , Rfl:    levothyroxine (SYNTHROID) 88 MCG tablet, TAKE 1 TABLET BY MOUTH DAILY, Disp: 90 tablet, Rfl: 3   Loperamide HCl (IMODIUM PO), Take 1 tablet by mouth as needed., Disp: , Rfl:    losartan (COZAAR) 100 MG tablet, Take 100 mg by mouth daily., Disp: , Rfl:    Multiple Vitamin (MULTI-VITAMINS) TABS, Take by mouth., Disp: , Rfl:    NON FORMULARY, at bedtime. CPAP @  11 cm H2O with Oxygen , Disp: , Rfl:    pantoprazole (PROTONIX) 40 MG tablet, Take 1 tablet by mouth 2 (two) times daily., Disp: , Rfl:    pravastatin (PRAVACHOL) 80 MG tablet, Take 80 mg by mouth daily., Disp: , Rfl:    pregabalin  (LYRICA ) 50 MG capsule, Take 50 mg by mouth 2 (two) times daily., Disp: , Rfl:    sucralfate  (CARAFATE ) 1 g tablet, Take by mouth., Disp: , Rfl:    Past Medical History:  Diagnosis Date   Allergy    Anxiety    Arthritis    neck, hands, lower back   Bilateral dry eyes    Chronic low back pain 12/23/2016   Coronary artery disease    DDD (degenerative disc disease), lumbar 10/02/2014   Dental crowns  present    Implants, front "flipper" while waiting for other implants   Emphysema of lung (HCC)    Fibromyalgia    Fibromyalgia    Fracture of right foot 01/14/2015   GERD (gastroesophageal reflux disease)    History of Graves' disease    Hyperlipidemia    Hypertension    Hypothyroidism    Myocardial infarction (HCC) 04/22/2006   Osteoarthritis    Other shoulder lesions, right shoulder 04/17/2015   Rheumatoid arthritis (HCC)    Sciatica, left side 01/01/2019   Sleep apnea    CPAP   Status post shoulder surgery 05/27/2015   Thyroid  disease    Vertigo    none for approx 9 yrs   Assessment and Plan:  1. Chronic, continuous use of opioids   2. Fibromyalgia   3. Facet syndrome, lumbar   4. Chronic pain syndrome   5. Chronic bilateral low back pain with bilateral sciatica   6. Pain of both shoulder joints   7. Cervical radiculitis   8. Cervical facet joint syndrome   9. Spondylosis of lumbar region without myelopathy or radiculopathy    Based on our conversation it is appropriate to continue with her current medication management.  She is no longer using the Vicodin so no refills will be generated at this point.  We talked about tramadol  as an alternative however she tried this in the past without any benefit from it.  She is getting good relief from Celebrex  200 mg tablets on a daily basis she reports.  Continue with the Valium  at bedtime as this is helping with muscle spasms and sleep and continue with her walking and consider indoor cycling for added health benefit.  Will schedule her for an August epidural contingent on her ongoing sciatica issues.  In the meantime continue follow-up with her primary care physicians for baseline medical care Follow Up Instructions:    I discussed the assessment and treatment plan with the patient. The patient was provided an opportunity to ask questions and all were answered. The patient agreed with the plan and demonstrated an understanding of  the instructions.   The patient was advised to call back or seek an in-person evaluation if the symptoms worsen or if the condition fails to improve as anticipated.  I provided 30 minutes of non-face-to-face time during this encounter.   Zula Hitch, MD

## 2023-08-23 ENCOUNTER — Other Ambulatory Visit: Payer: Self-pay | Admitting: Anesthesiology

## 2023-08-24 ENCOUNTER — Other Ambulatory Visit: Payer: Self-pay | Admitting: Pulmonary Disease

## 2023-08-24 DIAGNOSIS — Z87891 Personal history of nicotine dependence: Secondary | ICD-10-CM

## 2023-08-24 DIAGNOSIS — J449 Chronic obstructive pulmonary disease, unspecified: Secondary | ICD-10-CM

## 2023-09-05 DIAGNOSIS — H9201 Otalgia, right ear: Secondary | ICD-10-CM | POA: Diagnosis not present

## 2023-09-05 DIAGNOSIS — T161XXA Foreign body in right ear, initial encounter: Secondary | ICD-10-CM | POA: Diagnosis not present

## 2023-09-05 DIAGNOSIS — H903 Sensorineural hearing loss, bilateral: Secondary | ICD-10-CM | POA: Diagnosis not present

## 2023-09-14 ENCOUNTER — Other Ambulatory Visit: Payer: Self-pay | Admitting: Internal Medicine

## 2023-09-14 DIAGNOSIS — M797 Fibromyalgia: Secondary | ICD-10-CM

## 2023-09-15 ENCOUNTER — Ambulatory Visit
Admission: RE | Admit: 2023-09-15 | Discharge: 2023-09-15 | Disposition: A | Discharge status: Home / Self Care | Source: Ambulatory Visit | Attending: Pulmonary Disease | Admitting: Pulmonary Disease

## 2023-09-15 DIAGNOSIS — J449 Chronic obstructive pulmonary disease, unspecified: Secondary | ICD-10-CM | POA: Diagnosis not present

## 2023-09-15 DIAGNOSIS — Z87891 Personal history of nicotine dependence: Secondary | ICD-10-CM | POA: Diagnosis not present

## 2023-09-16 NOTE — Telephone Encounter (Signed)
 Requested medications are due for refill today.  A little too soon  Requested medications are on the active medications list.  yes  Last refill. 08/12/2023 #60 1 rf  Future visit scheduled.   yes  Notes to clinic.  Refill not delegated.    Requested Prescriptions  Pending Prescriptions Disp Refills   Diclofenac -miSOPROStol  75-0.2 MG TBEC [Pharmacy Med Name: DICLOFENAC  SODIUM/MISOPROSTOL  75-0.2MG  TABLET DR] 60 tablet 1    Sig: TAKE (1) TABLET BY MOUTH TWICE DAILY     Not Delegated - Analgesics:  Antirheumatic Agents - diclofenac /misoprostol  Failed - 09/16/2023  5:03 PM      Failed - This refill cannot be delegated      Passed - Cr in normal range and within 360 days    Creatinine, Ser  Date Value Ref Range Status  05/18/2023 0.93 0.57 - 1.00 mg/dL Final         Passed - HGB in normal range and within 360 days    Hemoglobin  Date Value Ref Range Status  05/18/2023 12.6 11.1 - 15.9 g/dL Final         Passed - Patient is not pregnant      Passed - Valid encounter within last 12 months    Recent Outpatient Visits           4 months ago Essential hypertension   [facility] at [facility], Leita DEL, MD       Future Appointments             In 1 month Justus, Leita DEL, MD [facility] at [facility], [facility]

## 2023-09-19 ENCOUNTER — Ambulatory Visit: Attending: Anesthesiology | Admitting: Anesthesiology

## 2023-09-19 DIAGNOSIS — F1190 Opioid use, unspecified, uncomplicated: Secondary | ICD-10-CM

## 2023-09-19 DIAGNOSIS — M4722 Other spondylosis with radiculopathy, cervical region: Secondary | ICD-10-CM | POA: Diagnosis not present

## 2023-09-19 DIAGNOSIS — M797 Fibromyalgia: Secondary | ICD-10-CM

## 2023-09-19 DIAGNOSIS — M25512 Pain in left shoulder: Secondary | ICD-10-CM

## 2023-09-19 DIAGNOSIS — M47816 Spondylosis without myelopathy or radiculopathy, lumbar region: Secondary | ICD-10-CM | POA: Diagnosis not present

## 2023-09-19 DIAGNOSIS — M5442 Lumbago with sciatica, left side: Secondary | ICD-10-CM | POA: Diagnosis not present

## 2023-09-19 DIAGNOSIS — M5432 Sciatica, left side: Secondary | ICD-10-CM

## 2023-09-19 DIAGNOSIS — M25511 Pain in right shoulder: Secondary | ICD-10-CM

## 2023-09-19 DIAGNOSIS — M5412 Radiculopathy, cervical region: Secondary | ICD-10-CM

## 2023-09-19 DIAGNOSIS — G8929 Other chronic pain: Secondary | ICD-10-CM

## 2023-09-19 DIAGNOSIS — G894 Chronic pain syndrome: Secondary | ICD-10-CM

## 2023-09-19 DIAGNOSIS — Z79891 Long term (current) use of opiate analgesic: Secondary | ICD-10-CM

## 2023-09-19 DIAGNOSIS — M5441 Lumbago with sciatica, right side: Secondary | ICD-10-CM

## 2023-09-19 DIAGNOSIS — M47812 Spondylosis without myelopathy or radiculopathy, cervical region: Secondary | ICD-10-CM

## 2023-09-19 MED ORDER — CELECOXIB 200 MG PO CAPS: 200.0000 mg | ORAL_CAPSULE | Freq: Every day | ORAL | 2 refills | Status: DC

## 2023-09-19 NOTE — Progress Notes (Unsigned)
 Virtual Visit via Video Note  I connected with Carrie Myers on 09/19/23 at 11:20 AM EDT by a video enabled telemedicine application and verified that I am speaking with the correct person using two identifiers.  Location: Patient: Home Provider: Pain control center   I discussed the limitations of evaluation and management by telemedicine and the availability of in person appointments. The patient expressed understanding and agreed to proceed.  History of Present Illness: I spoke to Carrie Myers via telephone as we could not link for the video portion of the conference.  She reports that she still having considerable amount of sciatica primarily affecting the left lower back with radiation from the left hip buttock region into the lower leg and occasionally affecting the left calf and left anterior tibial region.  She does not believe in opioids and does not feel that they helped her significantly.  She has been taking diclofenac  on a regular basis twice daily but I would like her to discontinue that and reinstitute Celebrex  200 mg once a day.  As she has had good success with this in the past.  Otherwise the quality characteristic and distribution of the pain she is experiencing is stable with no change in bowel or bladder function but continuing spasming and intractable pain affecting the left lateral hip and going down the left leg.  She does note that this pain did significantly improve for about 6 weeks following her L5-S1 epidural.  Despite efforts with stretching strengthening the pain persist. Review of systems: General: No fevers or chills Pulmonary: No shortness of breath or dyspnea Cardiac: No angina or palpitations or lightheadedness GI: No abdominal pain or constipation Psych: No depression   Observations/Objective:  Current Outpatient Medications:    celecoxib  (CELEBREX ) 200 MG capsule, Take 1 capsule (200 mg total) by mouth daily., Disp: 30 capsule, Rfl: 2   albuterol   (VENTOLIN  HFA) 108 (90 Base) MCG/ACT inhaler, Inhale into the lungs., Disp: , Rfl:    Armodafinil 150 MG tablet, Take 150 mg by mouth daily. For fibromyalgia fatigue, Disp: , Rfl:    aspirin EC 81 MG tablet, Take 81 mg by mouth daily., Disp: , Rfl:    cetirizine (ZYRTEC) 10 MG tablet, Take 10 mg by mouth daily., Disp: , Rfl:    clobetasol  ointment (TEMOVATE ) 0.05 %, Apply pea size amount 1-2 times weekly as maintenance, Disp: 30 g, Rfl: 1   diazepam  (VALIUM ) 5 MG tablet, Take 1 tablet (5 mg total) by mouth at bedtime as needed for anxiety., Disp: 30 tablet, Rfl: 2   Diclofenac -miSOPROStol  75-0.2 MG TBEC, TAKE (1) TABLET BY MOUTH TWICE DAILY, Disp: 60 tablet, Rfl: 1   DULoxetine  (CYMBALTA ) 30 MG capsule, Take 3 capsules (90 mg total) by mouth daily. (Patient not taking: Reported on 07/19/2023), Disp: 270 capsule, Rfl: 1   fluticasone (FLONASE) 50 MCG/ACT nasal spray, , Disp: , Rfl:    HYDROcodone -acetaminophen  (NORCO/VICODIN) 5-325 MG tablet, Take 1 tablet by mouth 2 (two) times daily as needed for moderate pain., Disp: 45 tablet, Rfl: 0   levocetirizine (XYZAL) 5 MG tablet, Take by mouth., Disp: , Rfl:    levothyroxine (SYNTHROID) 88 MCG tablet, TAKE 1 TABLET BY MOUTH DAILY, Disp: 90 tablet, Rfl: 3   Loperamide HCl (IMODIUM PO), Take 1 tablet by mouth as needed., Disp: , Rfl:    losartan (COZAAR) 100 MG tablet, Take 100 mg by mouth daily., Disp: , Rfl:    Multiple Vitamin (MULTI-VITAMINS) TABS, Take by mouth., Disp: , Rfl:  NON FORMULARY, at bedtime. CPAP @ 11 cm H2O with Oxygen , Disp: , Rfl:    pantoprazole (PROTONIX) 40 MG tablet, Take 1 tablet by mouth 2 (two) times daily., Disp: , Rfl:    pravastatin (PRAVACHOL) 80 MG tablet, Take 80 mg by mouth daily., Disp: , Rfl:    pregabalin  (LYRICA ) 50 MG capsule, Take 50 mg by mouth 2 (two) times daily., Disp: , Rfl:    sucralfate  (CARAFATE ) 1 g tablet, Take by mouth., Disp: , Rfl:    Past Medical History:  Diagnosis Date   Allergy    Anxiety     Arthritis    neck, hands, lower back   Bilateral dry eyes    Chronic low back pain 12/23/2016   Coronary artery disease    DDD (degenerative disc disease), lumbar 10/02/2014   Dental crowns present    Implants, front flipper while waiting for other implants   Emphysema of lung (HCC)    Fibromyalgia    Fibromyalgia    Fracture of right foot 01/14/2015   GERD (gastroesophageal reflux disease)    History of Graves' disease    Hyperlipidemia    Hypertension    Hypothyroidism    Myocardial infarction (HCC) 04/22/2006   Osteoarthritis    Other shoulder lesions, right shoulder 04/17/2015   Rheumatoid arthritis (HCC)    Sciatica, left side 01/01/2019   Sleep apnea    CPAP   Status post shoulder surgery 05/27/2015   Thyroid  disease    Vertigo    none for approx 9 yrs   Assessment and Plan:  1. Chronic, continuous use of opioids   2. Fibromyalgia   3. Facet syndrome, lumbar   4. Chronic pain syndrome   5. Chronic bilateral low back pain with bilateral sciatica   6. Pain of both shoulder joints   7. Cervical radiculitis   8. Cervical facet joint syndrome   9. Spondylosis of lumbar region without myelopathy or radiculopathy   10. Sciatica, left side    Based on conversation after review of the   practitioner database information it is appropriate for her to continue her current medication therapy.  She is currently using Valium  to help with muscle spasm and sleep at night and this is working well for her with no side effects.  She is avoiding opioids as they have not been beneficial for her.  I am going to restart her Celebrex  200 mg once a day.  Discontinue diclofenac .  Will schedule her for an epidural which is currently scheduled for late July and see if we can move this up to the early portion of next week.  No other changes are initiated now.  Continue follow-up with her primary care physician for baseline medical care. Follow Up Instructions:    I discussed the  assessment and treatment plan with the patient. The patient was provided an opportunity to ask questions and all were answered. The patient agreed with the plan and demonstrated an understanding of the instructions.   The patient was advised to call back or seek an in-person evaluation if the symptoms worsen or if the condition fails to improve as anticipated.  I provided 30 minutes of non-face-to-face time during this encounter.   Lynwood KANDICE Clause, MD

## 2023-09-20 NOTE — Patient Instructions (Signed)

## 2023-09-27 DIAGNOSIS — J320 Chronic maxillary sinusitis: Secondary | ICD-10-CM | POA: Diagnosis not present

## 2023-09-27 DIAGNOSIS — H903 Sensorineural hearing loss, bilateral: Secondary | ICD-10-CM | POA: Diagnosis not present

## 2023-09-27 DIAGNOSIS — J302 Other seasonal allergic rhinitis: Secondary | ICD-10-CM | POA: Diagnosis not present

## 2023-09-27 DIAGNOSIS — J3489 Other specified disorders of nose and nasal sinuses: Secondary | ICD-10-CM | POA: Diagnosis not present

## 2023-10-03 DIAGNOSIS — H18522 Epithelial (juvenile) corneal dystrophy, left eye: Secondary | ICD-10-CM | POA: Diagnosis not present

## 2023-10-03 DIAGNOSIS — Z961 Presence of intraocular lens: Secondary | ICD-10-CM | POA: Diagnosis not present

## 2023-10-08 ENCOUNTER — Other Ambulatory Visit: Payer: Self-pay | Admitting: Anesthesiology

## 2023-10-11 ENCOUNTER — Other Ambulatory Visit: Payer: Self-pay | Admitting: Anesthesiology

## 2023-10-11 DIAGNOSIS — G894 Chronic pain syndrome: Secondary | ICD-10-CM

## 2023-10-12 ENCOUNTER — Ambulatory Visit
Admission: RE | Admit: 2023-10-12 | Discharge: 2023-10-12 | Disposition: A | Discharge status: Home / Self Care | Source: Ambulatory Visit | Attending: Anesthesiology | Admitting: Anesthesiology

## 2023-10-12 ENCOUNTER — Ambulatory Visit (HOSPITAL_BASED_OUTPATIENT_CLINIC_OR_DEPARTMENT_OTHER): Admitting: Anesthesiology

## 2023-10-12 ENCOUNTER — Encounter: Payer: Self-pay | Admitting: Anesthesiology

## 2023-10-12 VITALS — BP 144/93 | HR 96 | Temp 98.2°F | Resp 19 | Ht 68.0 in | Wt 178.0 lb

## 2023-10-12 DIAGNOSIS — M5136 Other intervertebral disc degeneration, lumbar region with discogenic back pain only: Secondary | ICD-10-CM

## 2023-10-12 DIAGNOSIS — G894 Chronic pain syndrome: Secondary | ICD-10-CM

## 2023-10-12 DIAGNOSIS — M542 Cervicalgia: Secondary | ICD-10-CM | POA: Diagnosis present

## 2023-10-12 DIAGNOSIS — M5441 Lumbago with sciatica, right side: Secondary | ICD-10-CM | POA: Insufficient documentation

## 2023-10-12 DIAGNOSIS — M47812 Spondylosis without myelopathy or radiculopathy, cervical region: Secondary | ICD-10-CM | POA: Diagnosis present

## 2023-10-12 DIAGNOSIS — G8929 Other chronic pain: Secondary | ICD-10-CM

## 2023-10-12 DIAGNOSIS — M25512 Pain in left shoulder: Secondary | ICD-10-CM | POA: Diagnosis present

## 2023-10-12 DIAGNOSIS — M47816 Spondylosis without myelopathy or radiculopathy, lumbar region: Secondary | ICD-10-CM | POA: Insufficient documentation

## 2023-10-12 DIAGNOSIS — M5432 Sciatica, left side: Secondary | ICD-10-CM

## 2023-10-12 DIAGNOSIS — M5442 Lumbago with sciatica, left side: Secondary | ICD-10-CM | POA: Insufficient documentation

## 2023-10-12 DIAGNOSIS — F1190 Opioid use, unspecified, uncomplicated: Secondary | ICD-10-CM

## 2023-10-12 DIAGNOSIS — M25511 Pain in right shoulder: Secondary | ICD-10-CM | POA: Insufficient documentation

## 2023-10-12 DIAGNOSIS — M797 Fibromyalgia: Secondary | ICD-10-CM | POA: Insufficient documentation

## 2023-10-12 DIAGNOSIS — M5412 Radiculopathy, cervical region: Secondary | ICD-10-CM | POA: Diagnosis present

## 2023-10-12 MED ORDER — TRIAMCINOLONE ACETONIDE 40 MG/ML IJ SUSP
INTRAMUSCULAR | Status: AC
  Filled 2023-10-12: qty 1

## 2023-10-12 MED ORDER — SODIUM CHLORIDE 0.9% FLUSH
10.0000 mL | Freq: Once | INTRAVENOUS | Status: AC
  Administered 2023-10-12: 10 mL

## 2023-10-12 MED ORDER — SODIUM CHLORIDE (PF) 0.9 % IJ SOLN
INTRAMUSCULAR | Status: AC
Start: 2023-10-12 — End: 2023-10-12
  Filled 2023-10-12: qty 10

## 2023-10-12 MED ORDER — TRIAMCINOLONE ACETONIDE 40 MG/ML IJ SUSP
40.0000 mg | Freq: Once | INTRAMUSCULAR | Status: AC
  Administered 2023-10-12: 40 mg

## 2023-10-12 MED ORDER — IOHEXOL 180 MG/ML  SOLN
10.0000 mL | Freq: Once | INTRAMUSCULAR | Status: DC | PRN
  Filled 2023-10-12: qty 20

## 2023-10-12 MED ORDER — MIDAZOLAM HCL 2 MG/2ML IJ SOLN: 2.0000 mg | Freq: Once | INTRAMUSCULAR | Status: DC

## 2023-10-12 MED ORDER — LACTATED RINGERS IV SOLN: INTRAVENOUS | Status: DC

## 2023-10-12 MED ORDER — LIDOCAINE HCL (PF) 1 % IJ SOLN
5.0000 mL | Freq: Once | INTRAMUSCULAR | Status: AC
  Administered 2023-10-12: 5 mL via SUBCUTANEOUS
  Filled 2023-10-12: qty 5

## 2023-10-12 MED ORDER — ROPIVACAINE HCL 2 MG/ML IJ SOLN
10.0000 mL | Freq: Once | INTRAMUSCULAR | Status: AC
  Administered 2023-10-12: 1 mL via EPIDURAL
  Filled 2023-10-12: qty 20

## 2023-10-12 NOTE — Progress Notes (Unsigned)
 Safety precautions to be maintained throughout the outpatient stay will include: orient to surroundings, keep bed in low position, maintain call bell within reach at all times, provide assistance with transfer out of bed and ambulation.

## 2023-10-12 NOTE — Patient Instructions (Signed)

## 2023-10-14 ENCOUNTER — Encounter: Payer: Self-pay | Admitting: Anesthesiology

## 2023-10-14 MED ORDER — DIAZEPAM 5 MG PO TABS: 5.0000 mg | ORAL_TABLET | Freq: Every evening | ORAL | 2 refills | Status: DC | PRN

## 2023-10-14 NOTE — Progress Notes (Signed)
 Subjective:  Patient ID: Carrie Myers, female    DOB: 1945/06/05  Age: 78 y.o. MRN: [id number]  CC: Back Pain   Procedure: L5-S1 epidural steroid under fluoroscopic guidance with minimal sedation  HPI Carrie Myers presents for evaluation and epidural steroid injection.  She is continue to complain of pain of a gnawing aching spasming nature affecting the left calf and posterior lateral leg on the left side that is intractable and causing her increased pain and inability to sleep and get relief during the day.  She has been been doing stretching strengthening exercises and home therapy in addition to hot cold application or anti-inflammatories and has been unable to get any successful relief.  Otherwise no change in strength bowel or bladder function is noted.  She does take Valium  at night to help with spasming but this also is ineffective at present.  She desires to proceed with epidural steroid injection today.  Outpatient Medications Prior to Visit  Medication Sig Dispense Refill   albuterol  (VENTOLIN  HFA) 108 (90 Base) MCG/ACT inhaler Inhale into the lungs.     Armodafinil 150 MG tablet Take 150 mg by mouth daily. For fibromyalgia fatigue     aspirin EC 81 MG tablet Take 81 mg by mouth daily.     celecoxib  (CELEBREX ) 200 MG capsule Take 1 capsule (200 mg total) by mouth daily. 30 capsule 2   cetirizine (ZYRTEC) 10 MG tablet Take 10 mg by mouth daily.     clobetasol  ointment (TEMOVATE ) 0.05 % Apply pea size amount 1-2 times weekly as maintenance 30 g 1   diazepam  (VALIUM ) 5 MG tablet Take 1 tablet (5 mg total) by mouth at bedtime as needed for anxiety. 30 tablet 2   Diclofenac -miSOPROStol  75-0.2 MG TBEC TAKE (1) TABLET BY MOUTH TWICE DAILY 60 tablet 1   DULoxetine  (CYMBALTA ) 30 MG capsule Take 3 capsules (90 mg total) by mouth daily. 270 capsule 1   fluticasone (FLONASE) 50 MCG/ACT nasal spray      HYDROcodone -acetaminophen  (NORCO/VICODIN) 5-325 MG tablet Take 1 tablet by  mouth 2 (two) times daily as needed for moderate pain. 45 tablet 0   levocetirizine (XYZAL) 5 MG tablet Take by mouth.     levothyroxine (SYNTHROID) 88 MCG tablet TAKE 1 TABLET BY MOUTH DAILY 90 tablet 3   Loperamide HCl (IMODIUM PO) Take 1 tablet by mouth as needed.     losartan (COZAAR) 100 MG tablet Take 100 mg by mouth daily.     Multiple Vitamin (MULTI-VITAMINS) TABS Take by mouth.     NON FORMULARY at bedtime. CPAP @ 11 cm H2O with Oxygen      pantoprazole (PROTONIX) 40 MG tablet Take 1 tablet by mouth 2 (two) times daily.     pravastatin (PRAVACHOL) 80 MG tablet Take 80 mg by mouth daily.     pregabalin  (LYRICA ) 50 MG capsule Take 50 mg by mouth 2 (two) times daily.     sucralfate  (CARAFATE ) 1 g tablet Take by mouth.     No facility-administered medications prior to visit.    Review of Systems CNS: No confusion or sedation Cardiac: No angina or palpitations GI: No abdominal pain or constipation Constitutional: No nausea vomiting fevers or chills  Objective:  BP (!) 144/93   Pulse 96   Temp 98.2 F (36.8 C)   Resp 19   Ht 5' 8 (1.727 m)   Wt 178 lb (80.7 kg)   SpO2 100%   BMI 27.06 kg/m    BP  Readings from Last 3 Encounters:  10/12/23 (!) 144/93  07/19/23 (!) 159/97  05/18/23 112/68     Wt Readings from Last 3 Encounters:  10/12/23 178 lb (80.7 kg)  07/19/23 175 lb (79.4 kg)  07/14/23 175 lb (79.4 kg)     Physical Exam Pt is alert and oriented PERRL EOMI HEART IS RRR no murmur or rub LCTA no wheezing or rales MUSCULOSKELETAL reveals some paraspinous muscle tenderness but no overt trigger points.  Muscle tone and bulk is at baseline and she does have a positive straight leg raise on the left side negative on the right.  No paraspinous muscle trigger points are noted in the low back.  She walks with an antalgic gait.  Labs  Lab Results  Component Value Date   HGBA1C 6.4 (H) 05/18/2023   HGBA1C 6.3 (H) 04/21/2022   HGBA1C 6.1 (H) 09/05/2020   Lab  Results  Component Value Date   LDLCALC 64 05/18/2023   CREATININE 0.93 05/18/2023    -------------------------------------------------------------------------------------------------------------------- Lab Results  Component Value Date   WBC 5.8 05/18/2023   HGB 12.6 05/18/2023   HCT 38.1 05/18/2023   PLT 303 05/18/2023   GLUCOSE 91 05/18/2023   CHOL 148 05/18/2023   TRIG 146 05/18/2023   HDL 59 05/18/2023   LDLCALC 64 05/18/2023   ALT 21 05/18/2023   AST 25 05/18/2023   NA 144 05/18/2023   K 4.1 05/18/2023   CL 103 05/18/2023   CREATININE 0.93 05/18/2023   BUN 17 05/18/2023   CO2 25 05/18/2023   TSH 1.510 05/18/2023   HGBA1C 6.4 (H) 05/18/2023    --------------------------------------------------------------------------------------------------------------------- DG PAIN CLINIC C-ARM 1-60 MIN NO REPORT Result Date: 10/12/2023 Fluoro was used, but no Radiologist interpretation will be provided. Please refer to NOTES tab for provider progress note.    Assessment & Plan:   Chalet was seen today for back pain.  Diagnoses and all orders for this visit:  Chronic, continuous use of opioids  Fibromyalgia  Facet syndrome, lumbar  Chronic pain syndrome  Chronic bilateral low back pain with bilateral sciatica -     triamcinolone  acetonide (KENALOG -40) injection 40 mg -     sodium chloride  flush (NS) 0.9 % injection 10 mL -     ropivacaine  (PF) 2 mg/mL (0.2%) (NAROPIN ) injection 10 mL -     lidocaine  (PF) (XYLOCAINE ) 1 % injection 5 mL -     iohexol  (OMNIPAQUE ) 180 MG/ML injection 10 mL -     lactated ringers  infusion -     midazolam  (VERSED ) injection 2 mg  Pain of both shoulder joints  Cervical radiculitis  Cervical facet joint syndrome  Cervicalgia        ----------------------------------------------------------------------------------------------------------------------  Problem List Items Addressed This Visit       Unprioritized   Chronic pain  syndrome (Chronic)   Fibromyalgia (Chronic)   Facet syndrome, lumbar   Other Visit Diagnoses       Chronic, continuous use of opioids    -  Primary     Chronic bilateral low back pain with bilateral sciatica       Relevant Medications   triamcinolone  acetonide (KENALOG -40) injection 40 mg (Completed)   sodium chloride  flush (NS) 0.9 % injection 10 mL (Completed)   ropivacaine  (PF) 2 mg/mL (0.2%) (NAROPIN ) injection 10 mL (Completed)   lidocaine  (PF) (XYLOCAINE ) 1 % injection 5 mL (Completed)   iohexol  (OMNIPAQUE ) 180 MG/ML injection 10 mL   lactated ringers  infusion   midazolam  (VERSED ) injection 2  mg     Pain of both shoulder joints         Cervical radiculitis       Relevant Medications   midazolam  (VERSED ) injection 2 mg     Cervical facet joint syndrome         Cervicalgia             ----------------------------------------------------------------------------------------------------------------------  1. Chronic, continuous use of opioids (Primary) Continue on the current regimen with Valium  at bedtime to help with muscle spasm and sleep with chronic insomnia.  She does well with this.  2. Fibromyalgia Continue with daily core stretching strengthening exercises and physical therapy.  3. Facet syndrome, lumbar As above  4. Chronic pain syndrome As above  5. Chronic bilateral low back pain with bilateral sciatica Will proceed with a lumbar epidural steroid injection today as reviewed.  All questions were answered.  Continue with core stretching strengthening exercises as she improves.  We may schedule her for repeat epidural in 1 month contingent on her response to therapy today. - triamcinolone  acetonide (KENALOG -40) injection 40 mg - sodium chloride  flush (NS) 0.9 % injection 10 mL - ropivacaine  (PF) 2 mg/mL (0.2%) (NAROPIN ) injection 10 mL - lidocaine  (PF) (XYLOCAINE ) 1 % injection 5 mL - iohexol  (OMNIPAQUE ) 180 MG/ML injection 10 mL - lactated ringers   infusion - midazolam  (VERSED ) injection 2 mg  6. Pain of both shoulder joints   7. Cervical radiculitis Continue with upper extremity core stretching exercises as reviewed  8. Cervical facet joint syndrome As above  9. Cervicalgia As above and continue follow-up with her primary care physicians for baseline medical care with return scheduled in 1 month    ----------------------------------------------------------------------------------------------------------------------  I am having Carrie Myers maintain her pravastatin, aspirin EC, Multi-Vitamins, Loperamide HCl (IMODIUM PO), pantoprazole, Armodafinil, pregabalin , levocetirizine, NON FORMULARY, losartan, HYDROcodone -acetaminophen , albuterol , sucralfate , clobetasol  ointment, DULoxetine , fluticasone, levothyroxine, cetirizine, diazepam , Diclofenac -miSOPROStol , and celecoxib . We administered triamcinolone  acetonide, sodium chloride  flush, ropivacaine  (PF) 2 mg/mL (0.2%), and lidocaine  (PF).   Meds ordered this encounter  Medications   triamcinolone  acetonide (KENALOG -40) injection 40 mg   sodium chloride  flush (NS) 0.9 % injection 10 mL   ropivacaine  (PF) 2 mg/mL (0.2%) (NAROPIN ) injection 10 mL   lidocaine  (PF) (XYLOCAINE ) 1 % injection 5 mL   iohexol  (OMNIPAQUE ) 180 MG/ML injection 10 mL   lactated ringers  infusion   midazolam  (VERSED ) injection 2 mg   Patient's Medications  New Prescriptions   No medications on file  Previous Medications   ALBUTEROL  (VENTOLIN  HFA) 108 (90 BASE) MCG/ACT INHALER    Inhale into the lungs.   ARMODAFINIL 150 MG TABLET    Take 150 mg by mouth daily. For fibromyalgia fatigue   ASPIRIN EC 81 MG TABLET    Take 81 mg by mouth daily.   CELECOXIB  (CELEBREX ) 200 MG CAPSULE    Take 1 capsule (200 mg total) by mouth daily.   CETIRIZINE (ZYRTEC) 10 MG TABLET    Take 10 mg by mouth daily.   CLOBETASOL  OINTMENT (TEMOVATE ) 0.05 %    Apply pea size amount 1-2 times weekly as maintenance   DIAZEPAM   (VALIUM ) 5 MG TABLET    Take 1 tablet (5 mg total) by mouth at bedtime as needed for anxiety.   DICLOFENAC -MISOPROSTOL  75-0.2 MG TBEC    TAKE (1) TABLET BY MOUTH TWICE DAILY   DULOXETINE  (CYMBALTA ) 30 MG CAPSULE    Take 3 capsules (90 mg total) by mouth daily.   FLUTICASONE (FLONASE) 50 MCG/ACT NASAL SPRAY  HYDROCODONE -ACETAMINOPHEN  (NORCO/VICODIN) 5-325 MG TABLET    Take 1 tablet by mouth 2 (two) times daily as needed for moderate pain.   LEVOCETIRIZINE (XYZAL) 5 MG TABLET    Take by mouth.   LEVOTHYROXINE (SYNTHROID) 88 MCG TABLET    TAKE 1 TABLET BY MOUTH DAILY   LOPERAMIDE HCL (IMODIUM PO)    Take 1 tablet by mouth as needed.   LOSARTAN (COZAAR) 100 MG TABLET    Take 100 mg by mouth daily.   MULTIPLE VITAMIN (MULTI-VITAMINS) TABS    Take by mouth.   NON FORMULARY    at bedtime. CPAP @ 11 cm H2O with Oxygen    PANTOPRAZOLE (PROTONIX) 40 MG TABLET    Take 1 tablet by mouth 2 (two) times daily.   PRAVASTATIN (PRAVACHOL) 80 MG TABLET    Take 80 mg by mouth daily.   PREGABALIN  (LYRICA ) 50 MG CAPSULE    Take 50 mg by mouth 2 (two) times daily.   SUCRALFATE  (CARAFATE ) 1 G TABLET    Take by mouth.  Modified Medications   No medications on file  Discontinued Medications   No medications on file   ----------------------------------------------------------------------------------------------------------------------  Follow-up: No follow-ups on file.   Procedure: L5-S1 LESI with fluoroscopic guidance and minimal  sedation  NOTE: The risks, benefits, and expectations of the procedure have been discussed and explained to the patient who was understanding and in agreement with suggested treatment plan. No guarantees were made.  DESCRIPTION OF PROCEDURE: Lumbar epidural steroid injection with 2 mg IV Versed , EKG, blood pressure, pulse, and pulse oximetry monitoring. The procedure was performed with the patient in the prone position under fluoroscopic guidance.  Sterile prep x3 was initiated and I  then injected subcutaneous lidocaine  to the overlying L5-S1 site after its fluoroscopic identifictation.  Using strict aseptic technique, I then advanced an 18-gauge Tuohy epidural needle in the midline using interlaminar approach via loss-of-resistance to saline technique. There was negative aspiration for heme or  CSF.  I then confirmed position with both AP and Lateral fluoroscan.  2 cc of contrast dye were injected and a  total of 5 mL of Preservative-Free normal saline mixed with 40 mg of Kenalog  and 1cc Ropicaine 0.2 percent were injected incrementally via the  epidurally placed needle. The needle was removed. The patient tolerated the injection well and was convalesced and discharged to home in stable condition. Should the patient have any post procedure difficulty they have been instructed on how to contact us  for assistance.   Lynwood KANDICE Clause, MD

## 2023-10-18 DIAGNOSIS — M797 Fibromyalgia: Secondary | ICD-10-CM | POA: Diagnosis not present

## 2023-10-18 DIAGNOSIS — J449 Chronic obstructive pulmonary disease, unspecified: Secondary | ICD-10-CM | POA: Diagnosis not present

## 2023-10-18 DIAGNOSIS — K219 Gastro-esophageal reflux disease without esophagitis: Secondary | ICD-10-CM | POA: Diagnosis not present

## 2023-11-01 ENCOUNTER — Encounter: Payer: Self-pay | Admitting: Family Medicine

## 2023-11-01 ENCOUNTER — Ambulatory Visit: Payer: Self-pay | Admitting: Family Medicine

## 2023-11-01 ENCOUNTER — Ambulatory Visit (INDEPENDENT_AMBULATORY_CARE_PROVIDER_SITE_OTHER): Admitting: Family Medicine

## 2023-11-01 ENCOUNTER — Ambulatory Visit
Admission: RE | Admit: 2023-11-01 | Discharge: 2023-11-01 | Disposition: A | Discharge status: Home / Self Care | Attending: Family Medicine | Admitting: Family Medicine

## 2023-11-01 ENCOUNTER — Ambulatory Visit
Admission: RE | Admit: 2023-11-01 | Discharge: 2023-11-01 | Disposition: A | Discharge status: Home / Self Care | Source: Ambulatory Visit | Attending: Family Medicine | Admitting: Family Medicine

## 2023-11-01 VITALS — BP 124/82 | HR 79 | Ht 68.0 in | Wt 176.0 lb

## 2023-11-01 DIAGNOSIS — M79671 Pain in right foot: Secondary | ICD-10-CM | POA: Diagnosis not present

## 2023-11-01 NOTE — Assessment & Plan Note (Signed)
 History of Present Illness Carrie Myers is a 78 year old female who presents with right pinky toe pain and swelling. She was referred by Dr. Eva Gay for evaluation of a right toe contusion.  Right fifth toe pain and swelling - Acute onset of pain and swelling in the right pinky toe after wearing different shoes last Thursday - Swelling and blood-red discoloration localized to the tip of the right fifth toe - Pain and soreness present on Thursday and Friday, with improvement by Saturday - No current pain reported; redness has improved - Neosporin applied to the toe for infection prevention - History of soft tissue abrasions and prior contusion of the right toe evaluated by podiatry in August 2024  Paresthesia and dysesthesia of the toes and feet - Chronic numbness affecting four toes, present for a long duration - Burning sensation on the plantar surface of both feet, described as feeling hot or on fire, persistent for several years and unrelated to footwear  Foot deformity and arthralgia - History of arthritis in the feet - Right pinky toe is elongated and deviated, sometimes requiring a Band-Aid for discomfort  Physical Exam Right fifth toe with medial erythema, and excoriated skin.  Media Information  Document Information  Photos  Right fifth erythema  11/01/2023 16:02  Attached To:  Office Visit on 11/01/23 with Alvia Selinda PARAS, MD  Source Information  Alvia Selinda PARAS, MD  [facility]    Assessment and Plan Right fifth toe abrasion Recent abrasion with improved redness and swelling. No infection signs. Most likely related to structural issues and altered biomechanics due to altered toe positioning. - Order right foot x-ray to assess bone integrity and rule out structural issues. - Advise Neosporin application to prevent infection. - Recommend wearing non-pressure shoes until healing. - Refer to podiatrist Dr. Eva Gay for further  evaluation. - Instruct to monitor and report symptom changes.

## 2023-11-01 NOTE — Progress Notes (Signed)
 Primary Care / Sports Medicine Office Visit  Patient Information:  Patient ID: Carrie Myers, female DOB: September 30, 1945 Age: 78 y.o. MRN: [id number]   Carrie Myers is a pleasant 78 y.o. female presenting with the following:  Chief Complaint  Patient presents with   Foot Pain    Right foot pinky swelling and redness. NKI. No images.     Vitals:   11/01/23 1515  BP: 124/82  Pulse: 79  SpO2: 97%   Vitals:   11/01/23 1515  Weight: 176 lb (79.8 kg)  Height: 5' 8 (1.727 m)   Body mass index is 26.76 kg/m.  DG Foot Complete Right Result Date: 11/01/2023 CLINICAL DATA:  right 5th pain, eval [place] EXAM: RIGHT FOOT COMPLETE - 3+ VIEW COMPARISON:  None Available. FINDINGS: 2 cm K-wire along the first metatarsal head.No acute fracture or dislocation. There is no evidence of arthropathy or other focal bone abnormality. Soft tissues are unremarkable. No radiopaque foreign body. IMPRESSION: No acute fracture or dislocation. Electronically Signed   By: Rogelia Myers M.D.   On: 11/01/2023 17:52   Independent interpretation of notes and tests performed by another provider:   None  Procedures performed:   None  Pertinent History, Exam, Impression, and Recommendations:   Problem List Items Addressed This Visit     Right foot pain - Primary   History of Present Illness Carrie Myers is a 78 year old female who presents with right pinky toe pain and swelling. She was referred by Dr. Eva Gay for evaluation of a right toe contusion.  Right fifth toe pain and swelling - Acute onset of pain and swelling in the right pinky toe after wearing different shoes last Thursday - Swelling and blood-red discoloration localized to the tip of the right fifth toe - Pain and soreness present on Thursday and Friday, with improvement by Saturday - No current pain reported; redness has improved - Neosporin applied to the toe for infection prevention - History of soft tissue  abrasions and prior contusion of the right toe evaluated by podiatry in August 2024  Paresthesia and dysesthesia of the toes and feet - Chronic numbness affecting four toes, present for a long duration - Burning sensation on the plantar surface of both feet, described as feeling hot or on fire, persistent for several years and unrelated to footwear  Foot deformity and arthralgia - History of arthritis in the feet - Right pinky toe is elongated and deviated, sometimes requiring a Band-Aid for discomfort  Physical Exam Right fifth toe with medial erythema, and excoriated skin.  Media Information  Document Information  Photos  Right fifth erythema  11/01/2023 16:02  Attached To:  Office Visit on 11/01/23 with Alvia Selinda PARAS, MD  Source Information  Alvia Selinda PARAS, MD  [facility]    Assessment and Plan Right fifth toe abrasion Recent abrasion with improved redness and swelling. No infection signs. Most likely related to structural issues and altered biomechanics due to altered toe positioning. - Order right foot x-ray to assess bone integrity and rule out structural issues. - Advise Neosporin application to prevent infection. - Recommend wearing non-pressure shoes until healing. - Refer to podiatrist Dr. Eva Gay for further evaluation. - Instruct to monitor and report symptom changes.      Relevant Orders   DG Foot Complete Right (Completed)   A total of 30 minutes was spent on the date of service, 11/01/2023, encompassing both face-to-face and non-face-to-face time. This  included review of prior records and imaging (e.g., MRI and/or radiographs), medical chart review, information gathering, documentation, care coordination with clinic staff, discussion and counseling with the patient regarding clinical findings and treatment options, and planning for follow-up and next steps in management.   Orders & Medications Medications: No orders of the defined types  were placed in this encounter.  Orders Placed This Encounter  Procedures   DG Foot Complete Right     No follow-ups on file.     Selinda JINNY Ku, MD, [facility]   Primary Care Sports Medicine Primary Care and Sports Medicine at [facility]

## 2023-11-01 NOTE — Patient Instructions (Signed)
 Patient Plan for Post-Visit Guidance  - Right foot x-ray has been ordered to check bone structure. - Apply Neosporin to the right fifth toe to help prevent infection. - Wear shoes that do not put pressure on the toe until it is healed. - Follow up with podiatrist Dr. Eva Gay for further evaluation. - Monitor the toe and report any changes in symptoms.  Red Flags - If you notice increased redness, swelling, warmth, pus, severe pain, or develop a fever, seek medical attention promptly.

## 2023-11-04 ENCOUNTER — Ambulatory Visit: Admitting: Internal Medicine

## 2023-11-04 DIAGNOSIS — H18522 Epithelial (juvenile) corneal dystrophy, left eye: Secondary | ICD-10-CM | POA: Diagnosis not present

## 2023-11-08 ENCOUNTER — Ambulatory Visit: Attending: Anesthesiology | Admitting: Anesthesiology

## 2023-11-08 ENCOUNTER — Encounter: Payer: Self-pay | Admitting: Anesthesiology

## 2023-11-08 DIAGNOSIS — M25511 Pain in right shoulder: Secondary | ICD-10-CM | POA: Diagnosis not present

## 2023-11-08 DIAGNOSIS — M5442 Lumbago with sciatica, left side: Secondary | ICD-10-CM

## 2023-11-08 DIAGNOSIS — Z79891 Long term (current) use of opiate analgesic: Secondary | ICD-10-CM | POA: Diagnosis not present

## 2023-11-08 DIAGNOSIS — M5432 Sciatica, left side: Secondary | ICD-10-CM

## 2023-11-08 DIAGNOSIS — M47816 Spondylosis without myelopathy or radiculopathy, lumbar region: Secondary | ICD-10-CM

## 2023-11-08 DIAGNOSIS — M797 Fibromyalgia: Secondary | ICD-10-CM | POA: Diagnosis not present

## 2023-11-08 DIAGNOSIS — G894 Chronic pain syndrome: Secondary | ICD-10-CM | POA: Diagnosis not present

## 2023-11-08 DIAGNOSIS — M25512 Pain in left shoulder: Secondary | ICD-10-CM

## 2023-11-08 DIAGNOSIS — G8929 Other chronic pain: Secondary | ICD-10-CM

## 2023-11-08 DIAGNOSIS — M5441 Lumbago with sciatica, right side: Secondary | ICD-10-CM

## 2023-11-08 DIAGNOSIS — F1190 Opioid use, unspecified, uncomplicated: Secondary | ICD-10-CM

## 2023-11-08 NOTE — Progress Notes (Signed)
 Virtual Visit via Telephone Note  I connected with Carrie Myers Phlegm on 11/08/23 at  1:20 PM EDT by telephone and verified that I am speaking with the correct person using two identifiers.  Location: Patient: Home Provider: [facility] control center   I discussed the limitations, risks, security and privacy concerns of performing an evaluation and management service by telephone and the availability of in person appointments. I also discussed with the patient that there may be a patient responsible charge related to this service. The patient expressed understanding and agreed to proceed.   History of Present Illness: I spoke with Carrie Phlegm via telephone for her follow-up evaluation virtual after her epidural in July 30.  She has had 75% relief of the sciatica symptoms but no significant improvement in her low back pain.  She feels that this is mainly muscular at this point in the back.  She is having almost 100% relief from the sciatica for the first 2 weeks but has had some gradual recurrence but the pain that she is having going down in the leg does not go as far down the leg as before and she feels she is made good progress.  She is sleeping better at night and her medications are more effective for her as well.  She would like to undergo a repeat injection as soon as possible.  Otherwise no change in lower extremity strength function bowel or bladder function is noted.  Review of systems: General: No fevers or chills Pulmonary: No shortness of breath or dyspnea Cardiac: No angina or palpitations or lightheadedness GI: No abdominal pain or constipation Psych: No depression    Observations/Objective:  Current Outpatient Medications:    albuterol  (VENTOLIN  HFA) 108 (90 Base) MCG/ACT inhaler, Inhale into the lungs. (Patient taking differently: Inhale into the lungs.), Disp: , Rfl:    Armodafinil 150 MG tablet, Take 150 mg by mouth daily. For fibromyalgia fatigue (Patient taking  differently: Take 150 mg by mouth daily. For fibromyalgia fatigue), Disp: , Rfl:    aspirin EC 81 MG tablet, Take 81 mg by mouth daily., Disp: , Rfl:    celecoxib  (CELEBREX ) 200 MG capsule, Take 1 capsule (200 mg total) by mouth daily., Disp: 30 capsule, Rfl: 2   cetirizine (ZYRTEC) 10 MG tablet, Take 10 mg by mouth daily., Disp: , Rfl:    clobetasol  ointment (TEMOVATE ) 0.05 %, Apply pea size amount 1-2 times weekly as maintenance (Patient taking differently: Apply pea size amount 1-2 times weekly as maintenance), Disp: 30 g, Rfl: 1   diazepam  (VALIUM ) 5 MG tablet, Take 1 tablet (5 mg total) by mouth at bedtime as needed for anxiety., Disp: 30 tablet, Rfl: 2   Diclofenac -miSOPROStol  75-0.2 MG TBEC, TAKE (1) TABLET BY MOUTH TWICE DAILY, Disp: 60 tablet, Rfl: 1   DULoxetine  (CYMBALTA ) 30 MG capsule, Take 3 capsules (90 mg total) by mouth daily. (Patient taking differently: Take 3 capsules (90 mg total) by mouth daily.), Disp: 270 capsule, Rfl: 1   fluticasone (FLONASE) 50 MCG/ACT nasal spray, , Disp: , Rfl:    HYDROcodone -acetaminophen  (NORCO/VICODIN) 5-325 MG tablet, Take 1 tablet by mouth 2 (two) times daily as needed for moderate pain., Disp: 45 tablet, Rfl: 0   levocetirizine (XYZAL) 5 MG tablet, Take by mouth., Disp: , Rfl:    levothyroxine (SYNTHROID) 88 MCG tablet, TAKE 1 TABLET BY MOUTH DAILY, Disp: 90 tablet, Rfl: 3   losartan (COZAAR) 100 MG tablet, Take 100 mg by mouth daily., Disp: , Rfl:  Multiple Vitamin (MULTI-VITAMINS) TABS, Take by mouth., Disp: , Rfl:    NON FORMULARY, at bedtime. CPAP @ 11 cm H2O with Oxygen , Disp: , Rfl:    pravastatin (PRAVACHOL) 80 MG tablet, Take 80 mg by mouth daily., Disp: , Rfl:    sucralfate  (CARAFATE ) 1 g tablet, Take by mouth., Disp: , Rfl:    Past Medical History:  Diagnosis Date   Allergy    Anxiety    Arthritis    neck, hands, lower back   Bilateral dry eyes    Chronic low back pain 12/23/2016   Coronary artery disease    DDD (degenerative  disc disease), lumbar 10/02/2014   Dental crowns present    Implants, front flipper while waiting for other implants   Emphysema of lung (HCC)    Fibromyalgia    Fibromyalgia    Fracture of right foot 01/14/2015   GERD (gastroesophageal reflux disease)    History of Graves' disease    Hyperlipidemia    Hypertension    Hypothyroidism    Myocardial infarction (HCC) 04/22/2006   Osteoarthritis    Other shoulder lesions, right shoulder 04/17/2015   Rheumatoid arthritis (HCC)    Sciatica, left side 01/01/2019   Sleep apnea    CPAP   Status post shoulder surgery 05/27/2015   Thyroid  disease    Vertigo    none for approx 9 yrs   Assessment and Plan: 1. Chronic, continuous use of opioids   2. Fibromyalgia   3. Facet syndrome, lumbar   4. Chronic pain syndrome   5. Chronic bilateral low back pain with bilateral sciatica   6. Pain of both shoulder joints   7. Sciatica, left side    Based on our conversation today I think it is appropriate to plan for repeat epidural.  This knocked her pain score down from a VAS of 9 down to 2 or 3 and she has had ongoing improvement in the sciatica symptoms.  Unfortunately she failed more conservative therapy and she has been through anti-inflammatories in addition to muscle relaxants and physical therapy modalities that did not help with this.  She has made good progress but she has had some slight recurrence and I feel it is appropriate to schedule her for repeat epidural as she made good progress with this.  Continue core stretching strengthening exercises and we will schedule her for repeat epidural at the next available appointment.  Continue follow-up with her primary care physicians for baseline medical care.  Follow Up Instructions:    I discussed the assessment and treatment plan with the patient. The patient was provided an opportunity to ask questions and all were answered. The patient agreed with the plan and demonstrated an understanding of  the instructions.   The patient was advised to call back or seek an in-person evaluation if the symptoms worsen or if the condition fails to improve as anticipated.  I provided 20 minutes of non-face-to-face time during this encounter.   Lynwood KANDICE Clause, MD

## 2023-11-11 DIAGNOSIS — H18593 Other hereditary corneal dystrophies, bilateral: Secondary | ICD-10-CM | POA: Diagnosis not present

## 2023-11-22 ENCOUNTER — Other Ambulatory Visit: Payer: Self-pay | Admitting: Anesthesiology

## 2023-11-22 ENCOUNTER — Ambulatory Visit
Admission: RE | Admit: 2023-11-22 | Discharge: 2023-11-22 | Disposition: A | Discharge status: Home / Self Care | Source: Ambulatory Visit | Attending: Anesthesiology | Admitting: Anesthesiology

## 2023-11-22 ENCOUNTER — Ambulatory Visit: Admitting: Anesthesiology

## 2023-11-22 ENCOUNTER — Encounter: Payer: Self-pay | Admitting: Anesthesiology

## 2023-11-22 VITALS — BP 169/73 | HR 93 | Temp 97.2°F | Resp 17 | Ht 68.0 in | Wt 178.0 lb

## 2023-11-22 DIAGNOSIS — M5442 Lumbago with sciatica, left side: Secondary | ICD-10-CM | POA: Diagnosis present

## 2023-11-22 DIAGNOSIS — M47816 Spondylosis without myelopathy or radiculopathy, lumbar region: Secondary | ICD-10-CM | POA: Insufficient documentation

## 2023-11-22 DIAGNOSIS — R52 Pain, unspecified: Secondary | ICD-10-CM | POA: Insufficient documentation

## 2023-11-22 DIAGNOSIS — M5412 Radiculopathy, cervical region: Secondary | ICD-10-CM

## 2023-11-22 DIAGNOSIS — M5432 Sciatica, left side: Secondary | ICD-10-CM | POA: Diagnosis present

## 2023-11-22 DIAGNOSIS — M542 Cervicalgia: Secondary | ICD-10-CM | POA: Diagnosis present

## 2023-11-22 DIAGNOSIS — M47812 Spondylosis without myelopathy or radiculopathy, cervical region: Secondary | ICD-10-CM | POA: Diagnosis present

## 2023-11-22 DIAGNOSIS — M25511 Pain in right shoulder: Secondary | ICD-10-CM

## 2023-11-22 DIAGNOSIS — F1190 Opioid use, unspecified, uncomplicated: Secondary | ICD-10-CM | POA: Insufficient documentation

## 2023-11-22 DIAGNOSIS — G8929 Other chronic pain: Secondary | ICD-10-CM | POA: Diagnosis present

## 2023-11-22 DIAGNOSIS — G894 Chronic pain syndrome: Secondary | ICD-10-CM | POA: Diagnosis present

## 2023-11-22 DIAGNOSIS — M797 Fibromyalgia: Secondary | ICD-10-CM | POA: Diagnosis present

## 2023-11-22 DIAGNOSIS — M25512 Pain in left shoulder: Secondary | ICD-10-CM | POA: Insufficient documentation

## 2023-11-22 DIAGNOSIS — M5441 Lumbago with sciatica, right side: Secondary | ICD-10-CM | POA: Diagnosis present

## 2023-11-22 DIAGNOSIS — Z79891 Long term (current) use of opiate analgesic: Secondary | ICD-10-CM

## 2023-11-22 MED ORDER — ROPIVACAINE HCL 2 MG/ML IJ SOLN
INTRAMUSCULAR | Status: AC
  Filled 2023-11-22: qty 20

## 2023-11-22 MED ORDER — TRIAMCINOLONE ACETONIDE 40 MG/ML IJ SUSP
INTRAMUSCULAR | Status: AC
  Filled 2023-11-22: qty 1

## 2023-11-22 MED ORDER — LIDOCAINE HCL (PF) 1 % IJ SOLN
5.0000 mL | Freq: Once | INTRAMUSCULAR | Status: AC
  Administered 2023-11-22: 5 mL via SUBCUTANEOUS

## 2023-11-22 MED ORDER — SODIUM CHLORIDE 0.9% FLUSH
10.0000 mL | Freq: Once | INTRAVENOUS | Status: AC
  Administered 2023-11-22: 10 mL

## 2023-11-22 MED ORDER — MIDAZOLAM HCL 2 MG/2ML IJ SOLN: 2.0000 mg | Freq: Once | INTRAMUSCULAR | Status: DC

## 2023-11-22 MED ORDER — ROPIVACAINE HCL 2 MG/ML IJ SOLN
10.0000 mL | Freq: Once | INTRAMUSCULAR | Status: AC
  Administered 2023-11-22: 2 mL via EPIDURAL

## 2023-11-22 MED ORDER — LACTATED RINGERS IV SOLN: INTRAVENOUS | Status: DC

## 2023-11-22 MED ORDER — SODIUM CHLORIDE (PF) 0.9 % IJ SOLN
INTRAMUSCULAR | Status: AC
  Filled 2023-11-22: qty 10

## 2023-11-22 MED ORDER — TRIAMCINOLONE ACETONIDE 40 MG/ML IJ SUSP
40.0000 mg | Freq: Once | INTRAMUSCULAR | Status: AC
  Administered 2023-11-22: 40 mg

## 2023-11-22 MED ORDER — IOHEXOL 180 MG/ML  SOLN
10.0000 mL | Freq: Once | INTRAMUSCULAR | Status: AC | PRN
  Administered 2023-11-22: 10 mL via EPIDURAL
  Filled 2023-11-22: qty 20

## 2023-11-22 MED ORDER — LIDOCAINE HCL (PF) 1 % IJ SOLN
INTRAMUSCULAR | Status: AC
  Filled 2023-11-22: qty 10

## 2023-11-22 NOTE — Patient Instructions (Signed)
____________________________________________________________________________________________  Post-Procedure Discharge Instructions  Instructions: Apply ice:  Purpose: This will minimize any swelling and discomfort after procedure.  When: Day of procedure, as soon as you get home. How: Fill a plastic sandwich bag with crushed ice. Cover it with a small towel and apply to injection site. How long: (15 min on, 15 min off) Apply for 15 minutes then remove x 15 minutes.  Repeat sequence on day of procedure, until you go to bed. Apply heat:  Purpose: To treat any soreness and discomfort from the procedure. When: Starting the next day after the procedure. How: Apply heat to procedure site starting the day following the procedure. How long: May continue to repeat daily, until discomfort goes away. Food intake: Start with clear liquids (like water) and advance to regular food, as tolerated.  Physical activities: Keep activities to a minimum for the first 8 hours after the procedure. After that, then as tolerated. Driving: If you have received any sedation, be responsible and do not drive. You are not allowed to drive for 24 hours after having sedation. Blood thinner: (Applies only to those taking blood thinners) You may restart your blood thinner 6 hours after your procedure. Insulin: (Applies only to Diabetic patients taking insulin) As soon as you can eat, you may resume your normal dosing schedule. Infection prevention: Keep procedure site clean and dry. Shower daily and clean area with soap and water. Post-procedure Pain Diary: Extremely important that this be done correctly and accurately. Recorded information will be used to determine the next step in treatment. For the purpose of accuracy, follow these rules: Evaluate only the area treated. Do not report or include pain from an untreated area. For the purpose of this evaluation, ignore all other areas of pain, except for the treated area. After  your procedure, avoid taking a long nap and attempting to complete the pain diary after you wake up. Instead, set your alarm clock to go off every hour, on the hour, for the initial 8 hours after the procedure. Document the duration of the numbing medicine, and the relief you are getting from it. Do not go to sleep and attempt to complete it later. It will not be accurate. If you received sedation, it is likely that you were given a medication that may cause amnesia. Because of this, completing the diary at a later time may cause the information to be inaccurate. This information is needed to plan your care. Follow-up appointment: Keep your post-procedure follow-up evaluation appointment after the procedure (usually 2 weeks for most procedures, 6 weeks for radiofrequencies). DO NOT FORGET to bring you pain diary with you.   Expect: (What should I expect to see with my procedure?) From numbing medicine (AKA: Local Anesthetics): Numbness or decrease in pain. You may also experience some weakness, which if present, could last for the duration of the local anesthetic. Onset: Full effect within 15 minutes of injected. Duration: It will depend on the type of local anesthetic used. On the average, 1 to 8 hours.  From steroids (Applies only if steroids were used): Decrease in swelling or inflammation. Once inflammation is improved, relief of the pain will follow. Onset of benefits: Depends on the amount of swelling present. The more swelling, the longer it will take for the benefits to be seen. In some cases, up to 10 days. Duration: Steroids will stay in the system x 2 weeks. Duration of benefits will depend on multiple posibilities including persistent irritating factors. Side-effects: If present, they   may typically last 2 weeks (the duration of the steroids). Frequent: Cramps (if they occur, drink Gatorade and take over-the-counter Magnesium 450-500 mg once to twice a day); water retention with temporary  weight gain; increases in blood sugar; decreased immune system response; increased appetite. Occasional: Facial flushing (red, warm cheeks); mood swings; menstrual changes. Uncommon: Long-term decrease or suppression of natural hormones; bone thinning. (These are more common with higher doses or more frequent use. This is why we prefer that our patients avoid having any injection therapies in other practices.)  Very Rare: Severe mood changes; psychosis; aseptic necrosis. From procedure: Some discomfort is to be expected once the numbing medicine wears off. This should be minimal if ice and heat are applied as instructed.  Call if: (When should I call?) You experience numbness and weakness that gets worse with time, as opposed to wearing off. New onset bowel or bladder incontinence. (Applies only to procedures done in the spine)  Emergency Numbers: Durning business hours (Monday - Thursday, 8:00 AM - 4:00 PM) (Friday, 9:00 AM - 12:00 Noon): [phone] After hours: [phone] NOTE: If you are having a problem and are unable connect with, or to talk to a provider, then go to your nearest urgent care or emergency department. If the problem is serious and urgent, please call 911. ____________________________________________________________________________________________  Epidural Steroid Injection Patient Information  Description: The epidural space surrounds the nerves as they exit the spinal cord.  In some patients, the nerves can be compressed and inflamed by a bulging disc or a tight spinal canal (spinal stenosis).  By injecting steroids into the epidural space, we can bring irritated nerves into direct contact with a potentially helpful medication.  These steroids act directly on the irritated nerves and can reduce swelling and inflammation which often leads to decreased pain.  Epidural steroids may be injected anywhere along the spine and from the neck to the low back depending upon the  location of your pain.   After numbing the skin with local anesthetic (like Novocaine), a small needle is passed into the epidural space slowly.  You may experience a sensation of pressure while this is being done.  The entire block usually last less than 10 minutes.  Conditions which may be treated by epidural steroids:  Low back and leg pain Neck and arm pain Spinal stenosis Post-laminectomy syndrome Herpes zoster (shingles) pain Pain from compression fractures  Preparation for the injection:  Do not eat any solid food or dairy products within 8 hours of your appointment.  You may drink clear liquids up to 3 hours before appointment.  Clear liquids include water, black coffee, juice or soda.  No milk or cream please. You may take your regular medication, including pain medications, with a sip of water before your appointment  Diabetics should hold regular insulin (if taken separately) and take 1/2 normal NPH dos the morning of the procedure.  Carry some sugar containing items with you to your appointment. A driver must accompany you and be prepared to drive you home after your procedure.  Bring all your current medications with your. An IV may be inserted and sedation may be given at the discretion of the physician.   A blood pressure cuff, EKG and other monitors will often be applied during the procedure.  Some patients may need to have extra oxygen administered for a short period. You will be asked to provide medical information, including your allergies, prior to the procedure.  We must know immediately   if you are taking blood thinners (like Coumadin/Warfarin)  Or if you are allergic to IV iodine contrast (dye). We must know if you could possible be pregnant.  Possible side-effects: Bleeding from needle site Infection (rare, may require surgery) Nerve injury (rare) Numbness & tingling (temporary) Difficulty urinating (rare, temporary) Spinal headache ( a headache worse with upright  posture) Light -headedness (temporary) Pain at injection site (several days) Decreased blood pressure (temporary) Weakness in arm/leg (temporary) Pressure sensation in back/neck (temporary)  Call if you experience: Fever/chills associated with headache or increased back/neck pain. Headache worsened by an upright position. New onset weakness or numbness of an extremity below the injection site Hives or difficulty breathing (go to the emergency room) Inflammation or drainage at the infection site Severe back/neck pain Any new symptoms which are concerning to you  Please note:  Although the local anesthetic injected can often make your back or neck feel good for several hours after the injection, the pain will likely return.  It takes 3-7 days for steroids to work in the epidural space.  You may not notice any pain relief for at least that one week.  If effective, we will often do a series of three injections spaced 3-6 weeks apart to maximally decrease your pain.  After the initial series, we generally will wait several months before considering a repeat injection of the same type.  If you have any questions, please call [phone] [facility] 

## 2023-11-22 NOTE — Progress Notes (Unsigned)
 Safety precautions to be maintained throughout the outpatient stay will include: orient to surroundings, keep bed in low position, maintain call bell within reach at all times, provide assistance with transfer out of bed and ambulation.

## 2023-11-23 ENCOUNTER — Telehealth: Payer: Self-pay

## 2023-11-23 ENCOUNTER — Other Ambulatory Visit: Payer: Self-pay | Admitting: Obstetrics and Gynecology

## 2023-11-23 DIAGNOSIS — L900 Lichen sclerosus et atrophicus: Secondary | ICD-10-CM

## 2023-11-23 NOTE — Progress Notes (Signed)
 Subjective:  Patient ID: Inocente LELON Phlegm, female    DOB: 1945/09/27  Age: 78 y.o. MRN: [id number]  CC: Leg Pain (left)   Procedure: L3-4 lumbar epidural steroid under fluoroscopic guidance with no sedation  HPI JOSHLYN BEADLE presents for reevaluation.  Shamikia continues to have left anterior thigh pain with left hip pain that has been protracted and unresponsive to conservative care.  She desires to proceed with a repeat lumbar epidural steroid injection today.  She reports that she had significant improvement following her July injection but has had recurrence of that pain.  She has significant improvement in the low back pain and calf pain on the left side but still has left anterior thigh pain.  Despite conservative care with physical therapy and medication management this pain persist and she desires to proceed with a repeat epidural injection today to see if this can help with the left anterior thigh pain.  This pain is keeping her up at night and limiting her activity.  Outpatient Medications Prior to Visit  Medication Sig Dispense Refill   albuterol  (VENTOLIN  HFA) 108 (90 Base) MCG/ACT inhaler Inhale into the lungs. (Patient taking differently: Inhale into the lungs.)     Armodafinil 150 MG tablet Take 150 mg by mouth daily. For fibromyalgia fatigue (Patient taking differently: Take 150 mg by mouth daily. For fibromyalgia fatigue)     aspirin EC 81 MG tablet Take 81 mg by mouth daily.     celecoxib  (CELEBREX ) 200 MG capsule Take 1 capsule (200 mg total) by mouth daily. 30 capsule 2   cetirizine (ZYRTEC) 10 MG tablet Take 10 mg by mouth daily.     clobetasol  ointment (TEMOVATE ) 0.05 % Apply pea size amount 1-2 times weekly as maintenance (Patient taking differently: Apply pea size amount 1-2 times weekly as maintenance) 30 g 1   diazepam  (VALIUM ) 5 MG tablet Take 1 tablet (5 mg total) by mouth at bedtime as needed for anxiety. 30 tablet 2   Diclofenac -miSOPROStol  75-0.2 MG TBEC TAKE  (1) TABLET BY MOUTH TWICE DAILY 60 tablet 1   DULoxetine  (CYMBALTA ) 30 MG capsule Take 3 capsules (90 mg total) by mouth daily. (Patient taking differently: Take 3 capsules (90 mg total) by mouth daily.) 270 capsule 1   fluticasone (FLONASE) 50 MCG/ACT nasal spray      HYDROcodone -acetaminophen  (NORCO/VICODIN) 5-325 MG tablet Take 1 tablet by mouth 2 (two) times daily as needed for moderate pain. 45 tablet 0   levocetirizine (XYZAL) 5 MG tablet Take by mouth.     levothyroxine (SYNTHROID) 88 MCG tablet TAKE 1 TABLET BY MOUTH DAILY 90 tablet 3   losartan (COZAAR) 100 MG tablet Take 100 mg by mouth daily.     Multiple Vitamin (MULTI-VITAMINS) TABS Take by mouth.     NON FORMULARY at bedtime. CPAP @ 11 cm H2O with Oxygen      pravastatin (PRAVACHOL) 80 MG tablet Take 80 mg by mouth daily.     sucralfate  (CARAFATE ) 1 g tablet Take by mouth.     No facility-administered medications prior to visit.    Review of Systems CNS: No confusion or sedation Cardiac: No angina or palpitations GI: No abdominal pain or constipation Constitutional: No nausea vomiting fevers or chills  Objective:  BP (!) 169/73   Pulse 93   Temp (!) 97.2 F (36.2 C)   Resp 17   Ht 5' 8 (1.727 m)   Wt 178 lb (80.7 kg)   SpO2 96%   BMI 27.06  kg/m    BP Readings from Last 3 Encounters:  11/22/23 (!) 169/73  11/01/23 124/82  10/12/23 (!) 144/93     Wt Readings from Last 3 Encounters:  11/22/23 178 lb (80.7 kg)  11/01/23 176 lb (79.8 kg)  10/12/23 178 lb (80.7 kg)     Physical Exam Pt is alert and oriented PERRL EOMI HEART IS RRR no murmur or rub LCTA no wheezing or rales MUSCULOSKELETAL reveals some paraspinous muscle tenderness in the lumbar region but no overt trigger points.  She walks with a mildly antalgic gait with diminished sensation over the left anterior thigh muscle tone and bulk is at baseline.  Labs  Lab Results  Component Value Date   HGBA1C 6.4 (H) 05/18/2023   HGBA1C 6.3 (H)  04/21/2022   HGBA1C 6.1 (H) 09/05/2020   Lab Results  Component Value Date   LDLCALC 64 05/18/2023   CREATININE 0.93 05/18/2023    -------------------------------------------------------------------------------------------------------------------- Lab Results  Component Value Date   WBC 5.8 05/18/2023   HGB 12.6 05/18/2023   HCT 38.1 05/18/2023   PLT 303 05/18/2023   GLUCOSE 91 05/18/2023   CHOL 148 05/18/2023   TRIG 146 05/18/2023   HDL 59 05/18/2023   LDLCALC 64 05/18/2023   ALT 21 05/18/2023   AST 25 05/18/2023   NA 144 05/18/2023   K 4.1 05/18/2023   CL 103 05/18/2023   CREATININE 0.93 05/18/2023   BUN 17 05/18/2023   CO2 25 05/18/2023   TSH 1.510 05/18/2023   HGBA1C 6.4 (H) 05/18/2023    --------------------------------------------------------------------------------------------------------------------- DG PAIN CLINIC C-ARM 1-60 MIN NO REPORT Result Date: 11/22/2023 Fluoro was used, but no Radiologist interpretation will be provided. Please refer to NOTES tab for provider progress note.    Assessment & Plan:   Felicite was seen today for leg pain.  Diagnoses and all orders for this visit:  Chronic, continuous use of opioids  Fibromyalgia  Facet syndrome, lumbar  Pain of both shoulder joints  Chronic bilateral low back pain with bilateral sciatica  Chronic pain syndrome  Sciatica, left side  Cervical radiculitis  Cervical facet joint syndrome  Cervicalgia  Other orders -     triamcinolone  acetonide (KENALOG -40) injection 40 mg -     sodium chloride  flush (NS) 0.9 % injection 10 mL -     ropivacaine  (PF) 2 mg/mL (0.2%) (NAROPIN ) injection 10 mL -     lidocaine  (PF) (XYLOCAINE ) 1 % injection 5 mL -     iohexol  (OMNIPAQUE ) 180 MG/ML injection 10 mL -     midazolam  (VERSED ) injection 2 mg -     lactated ringers   infusion        ----------------------------------------------------------------------------------------------------------------------  Problem List Items Addressed This Visit       Unprioritized   Chronic pain syndrome (Chronic)   Fibromyalgia (Chronic)   Facet syndrome, lumbar   Sciatica, left side   Relevant Medications   midazolam  (VERSED ) injection 2 mg   Other Visit Diagnoses       Chronic, continuous use of opioids    -  Primary     Pain of both shoulder joints         Chronic bilateral low back pain with bilateral sciatica       Relevant Medications   triamcinolone  acetonide (KENALOG -40) injection 40 mg (Completed)   ropivacaine  (PF) 2 mg/mL (0.2%) (NAROPIN ) injection 10 mL (Completed)   lidocaine  (PF) (XYLOCAINE ) 1 % injection 5 mL (Completed)   midazolam  (VERSED ) injection 2 mg  Cervical radiculitis       Relevant Medications   midazolam  (VERSED ) injection 2 mg     Cervical facet joint syndrome         Cervicalgia             ----------------------------------------------------------------------------------------------------------------------  1. Chronic, continuous use of opioids (Primary)   2. Fibromyalgia Continue core stretching strengthening exercises as she is doing  3. Facet syndrome, lumbar As above  4. Pain of both shoulder joints   5. Chronic bilateral low back pain with bilateral sciatica Will proceed with a L3-4 lumbar epidural steroid today to see if this can help with some of the left anterior thigh pain.  I want her to continue with core stretching strengthening exercises and physical therapy modality.  Will schedule her for return to clinic in 1 month.  6. Chronic pain syndrome As above  7. Sciatica, left side As above  8. Cervical radiculitis Continue with upper extremity core stretching strengthening  9. Cervical facet joint syndrome As above  10. Cervicalgia As above.  For diffuse muscle soreness and spasm she can  continue the Valium  1 tablet at bedtime to help with sleep and muscle spasm which has been working well for her.  No side effects reported.    ----------------------------------------------------------------------------------------------------------------------  I am having Inocente MICAEL Phlegm maintain her pravastatin, aspirin EC, Multi-Vitamins, Armodafinil, levocetirizine, NON FORMULARY, losartan, HYDROcodone -acetaminophen , albuterol , sucralfate , clobetasol  ointment, DULoxetine , fluticasone, levothyroxine, cetirizine, Diclofenac -miSOPROStol , celecoxib , and diazepam . We administered triamcinolone  acetonide, sodium chloride  flush, ropivacaine  (PF) 2 mg/mL (0.2%), lidocaine  (PF), and iohexol .   Meds ordered this encounter  Medications   triamcinolone  acetonide (KENALOG -40) injection 40 mg   sodium chloride  flush (NS) 0.9 % injection 10 mL   ropivacaine  (PF) 2 mg/mL (0.2%) (NAROPIN ) injection 10 mL   lidocaine  (PF) (XYLOCAINE ) 1 % injection 5 mL   iohexol  (OMNIPAQUE ) 180 MG/ML injection 10 mL   midazolam  (VERSED ) injection 2 mg   lactated ringers  infusion   Patient's Medications  New Prescriptions   No medications on file  Previous Medications   ALBUTEROL  (VENTOLIN  HFA) 108 (90 BASE) MCG/ACT INHALER    Inhale into the lungs.   ARMODAFINIL 150 MG TABLET    Take 150 mg by mouth daily. For fibromyalgia fatigue   ASPIRIN EC 81 MG TABLET    Take 81 mg by mouth daily.   CELECOXIB  (CELEBREX ) 200 MG CAPSULE    Take 1 capsule (200 mg total) by mouth daily.   CETIRIZINE (ZYRTEC) 10 MG TABLET    Take 10 mg by mouth daily.   CLOBETASOL  OINTMENT (TEMOVATE ) 0.05 %    Apply pea size amount 1-2 times weekly as maintenance   DIAZEPAM  (VALIUM ) 5 MG TABLET    Take 1 tablet (5 mg total) by mouth at bedtime as needed for anxiety.   DICLOFENAC -MISOPROSTOL  75-0.2 MG TBEC    TAKE (1) TABLET BY MOUTH TWICE DAILY   DULOXETINE  (CYMBALTA ) 30 MG CAPSULE    Take 3 capsules (90 mg total) by mouth daily.   FLUTICASONE  (FLONASE) 50 MCG/ACT NASAL SPRAY       HYDROCODONE -ACETAMINOPHEN  (NORCO/VICODIN) 5-325 MG TABLET    Take 1 tablet by mouth 2 (two) times daily as needed for moderate pain.   LEVOCETIRIZINE (XYZAL) 5 MG TABLET    Take by mouth.   LEVOTHYROXINE (SYNTHROID) 88 MCG TABLET    TAKE 1 TABLET BY MOUTH DAILY   LOSARTAN (COZAAR) 100 MG TABLET    Take 100 mg by mouth daily.  MULTIPLE VITAMIN (MULTI-VITAMINS) TABS    Take by mouth.   NON FORMULARY    at bedtime. CPAP @ 11 cm H2O with Oxygen    PRAVASTATIN (PRAVACHOL) 80 MG TABLET    Take 80 mg by mouth daily.   SUCRALFATE  (CARAFATE ) 1 G TABLET    Take by mouth.  Modified Medications   No medications on file  Discontinued Medications   No medications on file   ----------------------------------------------------------------------------------------------------------------------  Follow-up: Return in about 1 month (around 12/22/2023) for evaluation, med refill.   Procedure: L3-L4 LESI with fluoroscopic guidance and without moderate sedation  NOTE: The risks, benefits, and expectations of the procedure have been discussed and explained to the patient who was understanding and in agreement with suggested treatment plan. No guarantees were made.  DESCRIPTION OF PROCEDURE: Lumbar epidural steroid injection with no IV Versed , EKG, blood pressure, pulse, and pulse oximetry monitoring. The procedure was performed with the patient in the prone position under fluoroscopic guidance.  Sterile prep x3 was initiated and I then injected subcutaneous lidocaine  to the overlying L3-L4 site after its fluoroscopic identifictation.  Using strict aseptic technique, I then advanced an 18-gauge Tuohy epidural needle in the midline using interlaminar approach via loss-of-resistance to saline technique. There was negative aspiration for heme or  CSF.  I then confirmed position with both AP and Lateral fluoroscan.  2 cc of contrast dye were injected and a  total of 5 mL of  Preservative-Free normal saline mixed with 40 mg of Kenalog  and 1cc Ropicaine 0.2 percent were injected incrementally via the  epidurally placed needle. The needle was removed. The patient tolerated the injection well and was convalesced and discharged to home in stable condition. Should the patient have any post procedure difficulty they have been instructed on how to contact us  for assistance.   Lynwood KANDICE Clause, MD

## 2023-11-23 NOTE — Telephone Encounter (Signed)
 Post procedure follow up.  Patient states she is doing good.

## 2023-11-28 ENCOUNTER — Encounter: Payer: Self-pay | Admitting: Internal Medicine

## 2023-11-28 ENCOUNTER — Ambulatory Visit (INDEPENDENT_AMBULATORY_CARE_PROVIDER_SITE_OTHER): Admitting: Internal Medicine

## 2023-11-28 VITALS — BP 132/70 | HR 85 | Ht 68.0 in | Wt 178.0 lb

## 2023-11-28 DIAGNOSIS — F331 Major depressive disorder, recurrent, moderate: Secondary | ICD-10-CM | POA: Diagnosis not present

## 2023-11-28 DIAGNOSIS — I10 Essential (primary) hypertension: Secondary | ICD-10-CM

## 2023-11-28 DIAGNOSIS — R7303 Prediabetes: Secondary | ICD-10-CM | POA: Diagnosis not present

## 2023-11-28 DIAGNOSIS — Z23 Encounter for immunization: Secondary | ICD-10-CM

## 2023-11-28 NOTE — Progress Notes (Signed)
 Date:  11/28/2023   Name:  Carrie Myers   DOB:  1946/01/27   MRN:  [id number]   Chief Complaint: Medical Management of Chronic Issues (HTN, depression. Patient is doing well and would like her flu vaccine. )  Hypertension This is a chronic problem. The problem is controlled. Pertinent negatives include no chest pain, headaches, palpitations or shortness of breath. Past treatments include angiotensin blockers. The current treatment provides significant improvement. There is no history of kidney disease, CAD/MI or CVA.  Depression        This is a chronic problem.  The problem has been rapidly improving since onset.  Associated symptoms include no fatigue, no myalgias and no headaches.  Past treatments include SNRIs - Serotonin and norepinephrine reuptake inhibitors (weaned off Cymbalta ). Diabetes She presents for her follow-up diabetic visit. Diabetes type: prediabetes. Pertinent negatives for hypoglycemia include no dizziness, headaches or nervousness/anxiousness. Pertinent negatives for diabetes include no chest pain, no fatigue and no weakness. Pertinent negatives for diabetic complications include no CVA.    Review of Systems  Constitutional:  Negative for chills, fatigue and unexpected weight change.  HENT:  Negative for trouble swallowing.   Eyes:  Negative for visual disturbance.  Respiratory:  Negative for cough, chest tightness, shortness of breath and wheezing.   Cardiovascular:  Negative for chest pain, palpitations and leg swelling.  Gastrointestinal:  Negative for abdominal pain, constipation and diarrhea.  Musculoskeletal:  Negative for arthralgias and myalgias.  Neurological:  Negative for dizziness, weakness, light-headedness and headaches.  Psychiatric/Behavioral:  Positive for depression. Negative for dysphoric mood and sleep disturbance. The patient is not nervous/anxious.      Lab Results  Component Value Date   NA 144 05/18/2023   K 4.1 05/18/2023   CO2 25  05/18/2023   GLUCOSE 91 05/18/2023   BUN 17 05/18/2023   CREATININE 0.93 05/18/2023   CALCIUM 9.6 05/18/2023   EGFR 64 05/18/2023   GFRNONAA 57 (L) 03/28/2020   Lab Results  Component Value Date   CHOL 148 05/18/2023   HDL 59 05/18/2023   LDLCALC 64 05/18/2023   TRIG 146 05/18/2023   CHOLHDL 2.5 05/18/2023   Lab Results  Component Value Date   TSH 1.510 05/18/2023   Lab Results  Component Value Date   HGBA1C 6.4 (H) 05/18/2023   Lab Results  Component Value Date   WBC 5.8 05/18/2023   HGB 12.6 05/18/2023   HCT 38.1 05/18/2023   MCV 87 05/18/2023   PLT 303 05/18/2023   Lab Results  Component Value Date   ALT 21 05/18/2023   AST 25 05/18/2023   ALKPHOS 91 05/18/2023   BILITOT 0.3 05/18/2023   No results found for: MARIEN BOLLS, VD25OH   Patient Active Problem List   Diagnosis Date Noted   Right foot pain 11/01/2023   Arthralgia of left ankle 10/20/2021   Osteopenia determined by x-ray 03/31/2021   Primary osteoarthritis of left knee 01/22/2021   Effusion, right knee 12/15/2020   Primary osteoarthritis of right knee 11/24/2020   Overactive bladder 06/18/2020   Prediabetes 04/14/2020   Slow transit constipation 06/05/2019   Sciatica, left side 01/01/2019   Diverticulosis of colon 04/25/2018   Change in bowel habits    Tobacco use disorder, moderate, in sustained remission 12/22/2017   Moderate episode of recurrent major depressive disorder (HCC) 10/03/2017   Inflammatory spondylopathy of lumbar region (HCC) 10/03/2017   Hoarseness, persistent 04/18/2017   OSA on CPAP 02/18/2017   Cervical  disc disease 12/31/2016   Generalized osteoarthritis 12/23/2016   Chronic pain syndrome 12/23/2016   Cystocele, midline 12/06/2016   Urinary incontinence 12/06/2016   Impingement syndrome of right shoulder 04/17/2015   Fibrocystic breast 01/18/2015   Gastroesophageal reflux disease without esophagitis 01/18/2015   Hypothyroidism, postablative 01/18/2015    Arteriosclerosis of coronary artery 12/29/2014   DJD of shoulder 11/13/2014   Fibromyalgia 10/02/2014   Intercostal neuralgia 10/02/2014   Facet syndrome, lumbar 10/02/2014   Sacroiliac joint disease 10/02/2014   Carotid artery narrowing 07/09/2014   Essential hypertension 07/01/2014   Osteoarthritis of thumb 09/24/2012   Mixed hyperlipidemia 09/22/2012   Arthritis of hand, degenerative 09/22/2012    Allergies  Allergen Reactions   Amoxicillin-Pot Clavulanate Nausea And Vomiting and Other (See Comments)   Codeine Nausea And Vomiting and Other (See Comments)   Sulfa Antibiotics Nausea And Vomiting   Tolterodine  Diarrhea   Proparacaine Itching   Shellfish Allergy Nausea And Vomiting    No issues with iodine     Past Surgical History:  Procedure Laterality Date   ABDOMINAL HYSTERECTOMY     total   APPENDECTOMY     BREAST BIOPSY Right    neg   BREAST CYST ASPIRATION Left    neg   BREAST EXCISIONAL BIOPSY Right    BUNIONECTOMY Bilateral    CESAREAN SECTION     COLONOSCOPY  2010   normal   COLONOSCOPY WITH PROPOFOL  N/A 04/03/2018   Procedure: COLONOSCOPY WITH BIOPSIES;  Surgeon: Jinny Carmine, MD;  [facility];  Service: Endoscopy;  Laterality: N/A;  sleep apnea   CORONARY ANGIOPLASTY WITH STENT PLACEMENT  04/2006   dental implant     [age]   DILATION AND CURETTAGE OF UTERUS     epidural steroid injection  2018   ESOPHAGOGASTRODUODENOSCOPY N/A 10/03/2020   Procedure: ESOPHAGOGASTRODUODENOSCOPY (EGD);  Surgeon: Maryruth Ole DASEN, MD;  Location: [facility];  Service: Endoscopy;  Laterality: N/A;  REQUESTING MORNING AFTER 9 AM   ESOPHAGOGASTRODUODENOSCOPY (EGD) WITH PROPOFOL  N/A 08/17/2022   Procedure: ESOPHAGOGASTRODUODENOSCOPY (EGD) WITH PROPOFOL ;  Surgeon: Maryruth Ole DASEN, MD;  Location: [facility] ENDOSCOPY;  Service: Endoscopy;  Laterality: N/A;   EYE SURGERY     bilateral cataract extraction   SHOULDER ARTHROSCOPY Right 05/07/2015   Procedure:  Extensive arthroscopic debridement and arthroscopic subacromial decompression, right shoulder.                        2.  Steroid injection right thumb CMC joint.;  Surgeon: Norleen JINNY Maltos, MD;  Location: [facility];  Service: Orthopedics;  Laterality: Right;   TOOTH EXTRACTION     Upper centrals and lateral    Social History   Tobacco Use   Smoking status: Former    Current packs/day: 0.00    Average packs/day: 1.3 packs/day for 44.0 years (55.0 ttl pk-yrs)    Types: Cigarettes    Start date: 04/22/1962    Quit date: 04/22/2006    Years since quitting: 17.6    Passive exposure: Never   Smokeless tobacco: Never   Tobacco comments:       Vaping Use   Vaping status: Never Used  Substance Use Topics   Alcohol use: Not Currently   Drug use: Never     Medication list has been reviewed and updated.  Current Meds  Medication Sig   albuterol  (VENTOLIN  HFA) 108 (90 Base) MCG/ACT inhaler Inhale into the lungs. (Patient taking differently: Inhale into the lungs.)  Armodafinil 150 MG tablet Take 150 mg by mouth daily. For fibromyalgia fatigue (Patient taking differently: Take 150 mg by mouth daily. For fibromyalgia fatigue)   aspirin EC 81 MG tablet Take 81 mg by mouth daily.   celecoxib  (CELEBREX ) 200 MG capsule Take 1 capsule (200 mg total) by mouth daily.   cetirizine (ZYRTEC) 10 MG tablet Take 10 mg by mouth daily.   clobetasol  ointment (TEMOVATE ) 0.05 % Apply pea size amount 1-2 times weekly as maintenance (Patient taking differently: Apply pea size amount 1-2 times weekly as maintenance)   diazepam  (VALIUM ) 5 MG tablet Take 1 tablet (5 mg total) by mouth at bedtime as needed for anxiety.   Diclofenac -miSOPROStol  75-0.2 MG TBEC TAKE (1) TABLET BY MOUTH TWICE DAILY   fluticasone (FLONASE) 50 MCG/ACT nasal spray    HYDROcodone -acetaminophen  (NORCO/VICODIN) 5-325 MG tablet Take 1 tablet by mouth 2 (two) times daily as needed for moderate pain.   levocetirizine (XYZAL) 5 MG tablet  Take by mouth.   levothyroxine (SYNTHROID) 88 MCG tablet TAKE 1 TABLET BY MOUTH DAILY   losartan (COZAAR) 100 MG tablet Take 100 mg by mouth daily.   Multiple Vitamin (MULTI-VITAMINS) TABS Take by mouth.   NON FORMULARY at bedtime. CPAP @ 11 cm H2O with Oxygen    pravastatin (PRAVACHOL) 80 MG tablet Take 80 mg by mouth daily.   [DISCONTINUED] DULoxetine  (CYMBALTA ) 30 MG capsule Take 3 capsules (90 mg total) by mouth daily. (Patient taking differently: Take 3 capsules (90 mg total) by mouth daily.)       11/28/2023    1:16 PM 05/18/2023    2:45 PM 05/21/2022    3:42 PM 04/21/2022   10:59 AM  GAD 7 : Generalized Anxiety Score  Nervous, Anxious, on Edge 0 0 0 3  Control/stop worrying 0 0 0 3  Worry too much - different things 0 0 0 3  Trouble relaxing 0 0 0 0  Restless 0 0 0 0  Easily annoyed or irritable 1 1 1 2   Afraid - awful might happen 0 0 0 0  Total GAD 7 Score 1 1 1 11   Anxiety Difficulty Not difficult at all Somewhat difficult Not difficult at all Very difficult       11/28/2023    1:16 PM 11/22/2023    1:53 PM 11/01/2023    3:35 PM  Depression screen PHQ 2/9  Decreased Interest 0 0 0  Down, Depressed, Hopeless 0 0 0  PHQ - 2 Score 0 0 0  Altered sleeping 0  0  Tired, decreased energy 0  0  Change in appetite 0  0  Feeling bad or failure about yourself  0  0  Trouble concentrating 0  0  Moving slowly or fidgety/restless 0  0  Suicidal thoughts 0  0  PHQ-9 Score 0  0  Difficult doing work/chores Not difficult at all  Not difficult at all    BP Readings from Last 3 Encounters:  11/28/23 132/70  11/22/23 (!) 169/73  11/01/23 124/82    Physical Exam Vitals and nursing note reviewed.  Constitutional:      General: She is not in acute distress.    Appearance: Normal appearance. She is well-developed.  HENT:     Head: Normocephalic and atraumatic.  Cardiovascular:     Rate and Rhythm: Normal rate and regular rhythm.     Heart sounds: No murmur heard. Pulmonary:      Effort: Pulmonary effort is normal. No respiratory distress.  Breath sounds: No wheezing or rhonchi.  Musculoskeletal:     Right lower leg: No edema.     Left lower leg: No edema.  Skin:    General: Skin is warm and dry.     Findings: No rash.  Neurological:     Mental Status: She is alert and oriented to person, place, and time.  Psychiatric:        Mood and Affect: Mood normal.        Behavior: Behavior normal.     Wt Readings from Last 3 Encounters:  11/28/23 178 lb (80.7 kg)  11/22/23 178 lb (80.7 kg)  11/01/23 176 lb (79.8 kg)    BP 132/70   Pulse 85   Ht 5' 8 (1.727 m)   Wt 178 lb (80.7 kg)   SpO2 98%   BMI 27.06 kg/m   Assessment and Plan:  Problem List Items Addressed This Visit       Unprioritized   Essential hypertension - Primary (Chronic)   Blood pressure is well controlled on losartan prescribed by Cardiology No medication side effects noted. Plan to continue current medications.       Moderate episode of recurrent major depressive disorder (HCC) (Chronic)   She has weaned off of Cymbalta  and is doing well. She has taken it intermittently but I recommend only taking it daily or not at all. Will discontinue from med list - if she needs to resume it she can return to discuss.      Prediabetes (Chronic)   Managing with diet and exercise. Lab Results  Component Value Date   HGBA1C 6.4 (H) 05/18/2023         Other Visit Diagnoses       Encounter for immunization       Relevant Orders   Flu vaccine HIGH DOSE PF(Fluzone Trivalent) (Completed)       Return in about 6 months (around 05/27/2024) for [facility] and Medicare Annual  Dr. Lemon.    Leita HILARIO Adie, MD [facility] [place]

## 2023-11-28 NOTE — Assessment & Plan Note (Addendum)
 Blood pressure is well controlled on losartan prescribed by Cardiology No medication side effects noted. Plan to continue current medications.

## 2023-11-28 NOTE — Assessment & Plan Note (Addendum)
 She has weaned off of Cymbalta  and is doing well. She has taken it intermittently but I recommend only taking it daily or not at all. Will discontinue from med list - if she needs to resume it she can return to discuss.

## 2023-11-28 NOTE — Assessment & Plan Note (Signed)
 Managing with diet and exercise. Lab Results  Component Value Date   HGBA1C 6.4 (H) 05/18/2023

## 2023-12-02 ENCOUNTER — Other Ambulatory Visit: Payer: Self-pay | Admitting: Internal Medicine

## 2023-12-02 DIAGNOSIS — M797 Fibromyalgia: Secondary | ICD-10-CM

## 2023-12-02 NOTE — Telephone Encounter (Signed)
 Requested medication (s) are due for refill today: Yes  Requested medication (s) are on the active medication list: Yes  Last refill:  09/19/23  Future visit scheduled: Yes  Notes to clinic:  Unable to refill per protocol, cannot delegate.      Requested Prescriptions  Pending Prescriptions Disp Refills   Diclofenac -miSOPROStol  75-0.2 MG TBEC [Pharmacy Med Name: DICLOFENAC  SODIUM/MISOPROSTOL  75-0.2MG  TABLET DR] 60 tablet 1    Sig: TAKE (1) TABLET BY MOUTH TWICE DAILY     Not Delegated - Analgesics:  Antirheumatic Agents - diclofenac /misoprostol  Failed - 12/02/2023  4:35 PM      Failed - This refill cannot be delegated      Passed - Cr in normal range and within 360 days    Creatinine, Ser  Date Value Ref Range Status  05/18/2023 0.93 0.57 - 1.00 mg/dL Final         Passed - HGB in normal range and within 360 days    Hemoglobin  Date Value Ref Range Status  05/18/2023 12.6 11.1 - 15.9 g/dL Final         Passed - Patient is not pregnant      Passed - Valid encounter within last 12 months    Recent Outpatient Visits           4 days ago Essential hypertension   [place] [facility], Leita DEL, MD   1 month ago Right foot pain   [place] [facility], Selinda PARAS, MD   6 months ago Essential hypertension   [facility] at [facility], Leita DEL, MD

## 2023-12-06 NOTE — Telephone Encounter (Signed)
 Called and canceled Rx refill for diclofenac -misoprostol  75-0.2 mg per Dr Justus. Patient is taking celebrex  from another provider and cannot take these two medication together.  - CMcAdoo

## 2023-12-26 ENCOUNTER — Encounter: Payer: Self-pay | Admitting: Anesthesiology

## 2023-12-26 ENCOUNTER — Ambulatory Visit: Attending: Anesthesiology | Admitting: Anesthesiology

## 2023-12-26 DIAGNOSIS — M25512 Pain in left shoulder: Secondary | ICD-10-CM

## 2023-12-26 DIAGNOSIS — M5441 Lumbago with sciatica, right side: Secondary | ICD-10-CM | POA: Diagnosis not present

## 2023-12-26 DIAGNOSIS — M797 Fibromyalgia: Secondary | ICD-10-CM

## 2023-12-26 DIAGNOSIS — M5442 Lumbago with sciatica, left side: Secondary | ICD-10-CM

## 2023-12-26 DIAGNOSIS — M25511 Pain in right shoulder: Secondary | ICD-10-CM | POA: Diagnosis not present

## 2023-12-26 DIAGNOSIS — G8929 Other chronic pain: Secondary | ICD-10-CM

## 2023-12-26 DIAGNOSIS — M47816 Spondylosis without myelopathy or radiculopathy, lumbar region: Secondary | ICD-10-CM | POA: Diagnosis not present

## 2023-12-26 DIAGNOSIS — F1190 Opioid use, unspecified, uncomplicated: Secondary | ICD-10-CM

## 2023-12-26 DIAGNOSIS — M5136 Other intervertebral disc degeneration, lumbar region with discogenic back pain only: Secondary | ICD-10-CM | POA: Diagnosis not present

## 2023-12-26 DIAGNOSIS — M5432 Sciatica, left side: Secondary | ICD-10-CM

## 2023-12-26 DIAGNOSIS — G894 Chronic pain syndrome: Secondary | ICD-10-CM | POA: Diagnosis not present

## 2023-12-26 DIAGNOSIS — Z79891 Long term (current) use of opiate analgesic: Secondary | ICD-10-CM | POA: Diagnosis not present

## 2023-12-26 DIAGNOSIS — M48062 Spinal stenosis, lumbar region with neurogenic claudication: Secondary | ICD-10-CM

## 2023-12-26 MED ORDER — DIAZEPAM 5 MG PO TABS: 5.0000 mg | ORAL_TABLET | Freq: Every evening | ORAL | 2 refills | Status: AC | PRN

## 2023-12-26 MED ORDER — HYDROCODONE-ACETAMINOPHEN 5-325 MG PO TABS: 1.0000 | ORAL_TABLET | Freq: Four times a day (QID) | ORAL | 0 refills | Status: DC | PRN

## 2023-12-26 NOTE — Progress Notes (Signed)
 Virtual Visit via Telephone Note  I connected with Carrie Myers on 12/26/23 at  2:40 PM EDT by telephone and verified that I am speaking with the correct person using two identifiers.  Location: Patient: Home Provider: Pain control center   I discussed the limitations, risks, security and privacy concerns of performing an evaluation and management service by telephone and the availability of in person appointments. I also discussed with the patient that there may be a patient responsible charge related to this service. The patient expressed understanding and agreed to proceed.   History of Present Illness: I spoke with Carrie Myers via telephone as she was unable to do the video portion of the conference.  She reports that she is continuing to have significant low back pain with left posterior lateral leg pain and hip pain.  She is not experiencing any significant left calf numbness tingling or cramping.  She notes that the pain she experiences is worse with any prolonged standing or walking.  Twisting motion seems to worsen her back pain as well.  Unfortunately she is having less benefit from the epidural steroid injections.  She did have a facet injection back in October 2023.  Following that she had epidurals back in 2024 and now 03-2023 however is getting less benefit from these.  I did review her MRI findings with her today.  We did talk about the facet degeneration and neuroforaminal encroachment in addition to the advanced spinal stenosis noted at L4-5.  At this point bowel and bladder function has been stable with no recent changes.  She does get good relief from the Valium .  She takes this at bedtime to help with low back spasm and lower extremity spasm in addition to insomnia and this works well without side effects.  Review of systems: General: No fevers or chills Pulmonary: No shortness of breath or dyspnea Cardiac: No angina or palpitations or lightheadedness GI: No abdominal  pain or constipation Psych: No depression    Observations/Objective:  Current Outpatient Medications:    HYDROcodone -acetaminophen  (NORCO/VICODIN) 5-325 MG tablet, Take 1 tablet by mouth every 6 (six) hours as needed for moderate pain (pain score 4-6) or severe pain (pain score 7-10)., Disp: 30 tablet, Rfl: 0   albuterol  (VENTOLIN  HFA) 108 (90 Base) MCG/ACT inhaler, Inhale into the lungs. (Patient taking differently: Inhale into the lungs.), Disp: , Rfl:    Armodafinil 150 MG tablet, Take 150 mg by mouth daily. For fibromyalgia fatigue (Patient taking differently: Take 150 mg by mouth daily. For fibromyalgia fatigue), Disp: , Rfl:    aspirin EC 81 MG tablet, Take 81 mg by mouth daily., Disp: , Rfl:    celecoxib  (CELEBREX ) 200 MG capsule, Take 1 capsule (200 mg total) by mouth daily., Disp: 30 capsule, Rfl: 2   cetirizine (ZYRTEC) 10 MG tablet, Take 10 mg by mouth daily., Disp: , Rfl:    clobetasol  ointment (TEMOVATE ) 0.05 %, Apply pea size amount 1-2 times weekly as maintenance (Patient taking differently: Apply pea size amount 1-2 times weekly as maintenance), Disp: 30 g, Rfl: 1   [START ON 02/06/2024] diazepam  (VALIUM ) 5 MG tablet, Take 1 tablet (5 mg total) by mouth at bedtime as needed for anxiety., Disp: 30 tablet, Rfl: 2   fluticasone (FLONASE) 50 MCG/ACT nasal spray, , Disp: , Rfl:    HYDROcodone -acetaminophen  (NORCO/VICODIN) 5-325 MG tablet, Take 1 tablet by mouth 2 (two) times daily as needed for moderate pain., Disp: 45 tablet, Rfl: 0   levocetirizine (XYZAL) 5  MG tablet, Take by mouth., Disp: , Rfl:    levothyroxine (SYNTHROID) 88 MCG tablet, TAKE 1 TABLET BY MOUTH DAILY, Disp: 90 tablet, Rfl: 3   losartan (COZAAR) 100 MG tablet, Take 100 mg by mouth daily., Disp: , Rfl:    Multiple Vitamin (MULTI-VITAMINS) TABS, Take by mouth., Disp: , Rfl:    NON FORMULARY, at bedtime. CPAP @ 11 cm H2O with Oxygen , Disp: , Rfl:    pravastatin (PRAVACHOL) 80 MG tablet, Take 80 mg by mouth daily.,  Disp: , Rfl:    Past Medical History:  Diagnosis Date   Allergy    Anxiety    Arthritis    neck, hands, lower back   Bilateral dry eyes    Chronic low back pain 12/23/2016   Coronary artery disease    DDD (degenerative disc disease), lumbar 10/02/2014   Dental crowns present    Implants, front flipper while waiting for other implants   Emphysema of lung (HCC)    Fibromyalgia    Fibromyalgia    Fracture of right foot 01/14/2015   GERD (gastroesophageal reflux disease)    History of Graves' disease    Hyperlipidemia    Hypertension    Hypothyroidism    Myocardial infarction (HCC) 04/22/2006   Osteoarthritis    Other shoulder lesions, right shoulder 04/17/2015   Rheumatoid arthritis (HCC)    Sciatica, left side 01/01/2019   Sleep apnea    CPAP   Status post shoulder surgery 05/27/2015   Thyroid  disease    Vertigo    none for approx 9 yrs   Assessment and Plan:  1. Chronic, continuous use of opioids   2. Fibromyalgia   3. Facet syndrome, lumbar   4. Pain of both shoulder joints   5. Chronic bilateral low back pain with bilateral sciatica   6. Chronic pain syndrome   7. Sciatica, left side   8. Spondylosis of lumbar region without myelopathy or radiculopathy   9. Degeneration of intervertebral disc of lumbar region with discogenic back pain   10. Spinal stenosis of lumbar region with neurogenic claudication    Based on our conversation I think it is appropriate to keep her on her current medication management.  She has been on hydrocodone  and uses these very sparingly.  Her last refill was back in 2024.  I will refill that today for 1 tablet as needed every 8 hours.  Additionally she can continue with the Valium  at bedtime.  She is due for refill in November 24.  I have reviewed the [place]  practitioner database information and that is appropriate for refill.  Continue with stretching strengthening and core stretching strengthening with physical therapy.  In  addition I am referring her to Dr. Katrina for evaluation to see if she is a candidate for surgical decompression and possible fusion especially considering her spinal stenosis.  Continue follow-up with her primary care physicians for baseline medical care with a scheduled return to clinic in 2 months. Follow Up Instructions:    I discussed the assessment and treatment plan with the patient. The patient was provided an opportunity to ask questions and all were answered. The patient agreed with the plan and demonstrated an understanding of the instructions.   The patient was advised to call back or seek an in-person evaluation if the symptoms worsen or if the condition fails to improve as anticipated.  I provided 30 minutes of non-face-to-face time during this encounter.   Lynwood KANDICE Clause, MD

## 2024-01-02 ENCOUNTER — Other Ambulatory Visit: Payer: Self-pay | Admitting: Internal Medicine

## 2024-01-02 DIAGNOSIS — M797 Fibromyalgia: Secondary | ICD-10-CM

## 2024-01-04 ENCOUNTER — Telehealth: Payer: Self-pay

## 2024-01-04 NOTE — Progress Notes (Signed)
 Referring Physician:  Myra Lynwood MATSU, MD [address] [place],  [place] [address]  Primary Physician:  Justus Leita DEL, MD  History of Present Illness: 01/10/2024 [name] is here today with a chief complaint of chronic back pain and spinal stenosis who presents with worsening symptoms. She was referred by Dr. Myra for evaluation of her chronic back pain and spinal stenosis.  She has experienced significant back pain for the past ten years, initially affecting her left sciatic nerve. Pain management injections were effective until September, but the pain has worsened over the past two months. The pain is centered in her lower back, radiating down both legs, and is exacerbated by walking or standing for prolonged periods. She uses a walking stick for support and wears a brace when active. The pain is severe enough to prevent her from walking more than a few steps without significant discomfort.  She has fibromyalgia, which does not cause as much pain as her current back issues. She also has two degenerative discs, one in her neck and one in her lower back. She experiences balance issues, which she attributes to either her medication or pain. No numbness or tingling in her legs, but she reports discomfort in her hands due to arthritis, affecting her grip strength.  Her daily activities have been significantly impacted; she used to walk 4,000 to 6,000 steps daily but has been unable to maintain this due to her pain. She can stand for about an hour before the pain becomes unbearable, and she finds relief in her recliner with a heating pad.  She lives alone and has a supportive family, including a daughter and a niece who is a engineer, civil (consulting).  Bowel/Bladder Dysfunction: none  Conservative measures:  Physical therapy: Has participated in at [facility] over 2 years ago.  Multimodal medical therapy including regular antiinflammatories: Celebrex , Hydrocodone , Valium    Injections:  11/22/2023: Left L3-4 ESI by Dr. Myra 10/12/2023: L5-S1 ESI by Dr. Myra 07/19/2023:  L5-S1 ESI by Dr. Myra  Past Surgery: no spinal surgeries   Carrie Myers has no symptoms of cervical myelopathy.  The symptoms are causing a significant impact on the patient's life.   I have utilized the care everywhere function in epic to review the outside records available from external health systems.   Review of Systems:  A 10 point review of systems is negative, except for the pertinent positives and negatives detailed in the HPI.  Past Medical History: Past Medical History:  Diagnosis Date   Allergy    Anxiety    Arthritis    neck, hands, lower back   Bilateral dry eyes    Chronic low back pain 12/23/2016   Coronary artery disease    DDD (degenerative disc disease), lumbar 10/02/2014   Dental crowns present    Implants, front flipper while waiting for other implants   Emphysema of lung (HCC)    Fibromyalgia    Fibromyalgia    Fracture of right foot 01/14/2015   GERD (gastroesophageal reflux disease)    History of Graves' disease    Hyperlipidemia    Hypertension    Hypothyroidism    Myocardial infarction (HCC) 04/22/2006   Osteoarthritis    Other shoulder lesions, right shoulder 04/17/2015   Rheumatoid arthritis (HCC)    Sciatica, left side 01/01/2019   Sleep apnea    CPAP   Status post shoulder surgery 05/27/2015   Thyroid  disease    Vertigo    none for  approx 9 yrs    Past Surgical History: Past Surgical History:  Procedure Laterality Date   ABDOMINAL HYSTERECTOMY     total   APPENDECTOMY     BREAST BIOPSY Right    neg   BREAST CYST ASPIRATION Left    neg   BREAST EXCISIONAL BIOPSY Right    BUNIONECTOMY Bilateral    CESAREAN SECTION     COLONOSCOPY  2010   normal   COLONOSCOPY WITH PROPOFOL  N/A 04/03/2018   Procedure: COLONOSCOPY WITH BIOPSIES;  Surgeon: Jinny Carmine, MD;  Location: [facility];  Service: Endoscopy;  Laterality:  N/A;  sleep apnea   CORONARY ANGIOPLASTY WITH STENT PLACEMENT  04/2006   dental implant     [age]   DILATION AND CURETTAGE OF UTERUS     epidural steroid injection  2018   ESOPHAGOGASTRODUODENOSCOPY N/A 10/03/2020   Procedure: ESOPHAGOGASTRODUODENOSCOPY (EGD);  Surgeon: Maryruth Ole DASEN, MD;  Location: [facility] END[facility];  Service: Endoscopy;  Laterality: N/A;  REQUESTING MORNING AFTER 9 AM   ESOPHAGOGASTRODUODENOSCOPY (EGD) WITH PROPOFOL  N/A 08/17/2022   Procedure: ESOPHAGOGASTRODUODENOSCOPY (EGD) WITH PROPOFOL ;  Surgeon: Maryruth Ole DASEN, MD;  Location: [facility] ENDOSCOPY;  Service: Endoscopy;  Laterality: N/A;   EYE SURGERY     bilateral cataract extraction   SHOULDER ARTHROSCOPY Right 05/07/2015   Procedure: Extensive arthroscopic debridement and arthroscopic subacromial decompression, right shoulder.                        2.  Steroid injection right thumb CMC joint.;  Surgeon: Norleen JINNY Maltos, MD;  Location: [facility];  Service: Orthopedics;  Laterality: Right;   TOOTH EXTRACTION     Upper centrals and lateral    Allergies: Allergies as of 01/10/2024 - Review Complete 12/26/2023  Allergen Reaction Noted   Amoxicillin-pot clavulanate Nausea And Vomiting and Other (See Comments) 10/02/2014   Codeine Nausea And Vomiting and Other (See Comments) 10/02/2014   Sulfa antibiotics Nausea And Vomiting 10/02/2014   Tolterodine  Diarrhea 01/19/2023   Proparacaine Itching 09/28/2017   Shellfish allergy Nausea And Vomiting 10/02/2014    Medications:  Current Outpatient Medications:    albuterol  (VENTOLIN  HFA) 108 (90 Base) MCG/ACT inhaler, Inhale into the lungs. (Patient taking differently: Inhale into the lungs.), Disp: , Rfl:    Armodafinil 150 MG tablet, Take 150 mg by mouth daily. For fibromyalgia fatigue (Patient taking differently: Take 150 mg by mouth daily. For fibromyalgia fatigue), Disp: , Rfl:    aspirin EC 81 MG tablet, Take 81 mg by mouth daily., Disp: , Rfl:    cetirizine  (ZYRTEC) 10 MG tablet, Take 10 mg by mouth daily., Disp: , Rfl:    clobetasol  ointment (TEMOVATE ) 0.05 %, Apply pea size amount 1-2 times weekly as maintenance (Patient taking differently: Apply pea size amount 1-2 times weekly as maintenance), Disp: 30 g, Rfl: 1   [START ON 02/06/2024] diazepam  (VALIUM ) 5 MG tablet, Take 1 tablet (5 mg total) by mouth at bedtime as needed for anxiety., Disp: 30 tablet, Rfl: 2   Diclofenac -miSOPROStol  75-0.2 MG TBEC, TAKE (1) TABLET BY MOUTH TWICE DAILY, Disp: 60 tablet, Rfl: 1   fluticasone (FLONASE) 50 MCG/ACT nasal spray, , Disp: , Rfl:    HYDROcodone -acetaminophen  (NORCO/VICODIN) 5-325 MG tablet, Take 1 tablet by mouth 2 (two) times daily as needed for moderate pain., Disp: 45 tablet, Rfl: 0   levocetirizine (XYZAL) 5 MG tablet, Take by mouth., Disp: , Rfl:    levothyroxine (SYNTHROID) 88 MCG tablet, TAKE  1 TABLET BY MOUTH DAILY, Disp: 90 tablet, Rfl: 3   losartan (COZAAR) 100 MG tablet, Take 100 mg by mouth daily., Disp: , Rfl:    Multiple Vitamin (MULTI-VITAMINS) TABS, Take by mouth., Disp: , Rfl:    NON FORMULARY, at bedtime. CPAP @ 11 cm H2O with Oxygen , Disp: , Rfl:    pravastatin (PRAVACHOL) 80 MG tablet, Take 80 mg by mouth daily., Disp: , Rfl:    HYDROcodone -acetaminophen  (NORCO/VICODIN) 5-325 MG tablet, Take 1 tablet by mouth every 6 (six) hours as needed for moderate pain (pain score 4-6) or severe pain (pain score 7-10)., Disp: 30 tablet, Rfl: 0  Social History: Social History   Tobacco Use   Smoking status: Former    Current packs/day: 0.00    Average packs/day: 1.3 packs/day for 44.0 years (55.0 ttl pk-yrs)    Types: Cigarettes    Start date: 04/22/1962    Quit date: 04/22/2006    Years since quitting: 17.7    Passive exposure: Never   Smokeless tobacco: Never   Tobacco comments:       Vaping Use   Vaping status: Never Used  Substance Use Topics   Alcohol use: Not Currently   Drug use: Never    Family Medical History: Family History   Problem Relation Age of Onset   Stroke Mother    Heart disease Father    Pancreatic cancer Sister    Diabetes Sister    Prostate cancer Brother    Breast cancer Neg Hx    Kidney cancer Neg Hx    Bladder Cancer Neg Hx     Physical Examination: Vitals:   01/10/24 1347  BP: 130/86    General: Patient is in no apparent distress. Attention to examination is appropriate.  Neck:   Supple.  Full range of motion.  Respiratory: Patient is breathing without any difficulty.   NEUROLOGICAL:     Awake, alert, oriented to person, place, and time.  Speech is clear and fluent.   Cranial Nerves: Pupils equal round and reactive to light.  Facial tone is symmetric.  Facial sensation is symmetric. Shoulder shrug is symmetric. Tongue protrusion is midline.  There is no pronator drift.  Strength: Side Biceps Triceps Deltoid Interossei Grip Wrist Ext. Wrist Flex.  R 5 5 5 5 5 5 5   L 5 5 5 5 5 5 5    Side Iliopsoas Quads Hamstring PF DF EHL  R 5 5 5 5 5 5   L 5 5 5 5 5 5    Reflexes are 1+ and symmetric at the biceps, triceps, brachioradialis, patella and achilles.   Hoffman's is absent.   Bilateral upper and lower extremity sensation is intact to light touch.    No evidence of dysmetria noted.  Gait is antalgic.     Medical Decision Making  Imaging: MR L spine 05/24/2023  Disc levels:   T11-12: Disc space narrowing with small protrusion only covered on sagittal images. No impingement.   L1-L2: Mild disc desiccation, narrowing, and bulging. Mild facet spurring.   L2-L3: Mild disc bulging and facet spurring.   L3-L4: Mild disc narrowing with circumferential bulging. Mild degenerative facet spurring   L4-L5: Advanced facet osteoarthritis with spurring, ligamentum flavum thickening, anterolisthesis. The disc is narrowed and circumferentially bulging. Advanced spinal stenosis. Patent foramina.   L5-S1:Disc collapse and endplate degeneration that is progressed. The disc is  circumferentially bulging and there is mild to moderate bilateral facet spurring. Mild left foraminal narrowing.   IMPRESSION: 1. Facet  osteoarthritis focally advanced at L4-5 where there is anterolisthesis and advanced spinal stenosis that is progressed from 2011. 2. Focally advanced disc degeneration at L5-S1 with progression from 2011. Mild foraminal narrowing at this level. 3. Ectatic infrarenal aorta measuring up to 2.7 cm. Recommend follow-up ultrasound every 5 years. This recommendation follows ACR consensus guidelines: White Paper of the ACR Incidental Findings Committee II on Vascular Findings. J Am Coll Radiol 2013; 10:789-794.     Electronically Signed   By: Dorn Roulette M.D.   On: 06/10/2023 05:39    I have personally reviewed the images and agree with the above interpretation.  Assessment and Plan: Carrie Myers is a pleasant 78 y.o. female with worsening anterolisthesis at L4-5.  She has severe stenosis at L4-5.  She now has severe back pain as well as bilateral sciatica.  Her degree of stenosis is caused neurogenic claudication in addition to this.  In reviewing her old MRI scans, her findings have progressed over time.  With her anterolisthesis with severe stenosis and prior failure of physical therapy in the last 2 years, I do not feel that further therapy is likely to help her.  That being said, we will start some therapy now.  I think it is extremely likely that she will need an L4-5 lateral lumbar interbody fusion with percutaneous fixation.  He will begin planning for yes and start physical therapy in the meantime.  I discussed the planned procedure at length with the patient, including the risks, benefits, alternatives, and indications. The risks discussed include but are not limited to bleeding, infection, need for reoperation, spinal fluid leak, stroke, vision loss, anesthetic complication, coma, paralysis, and even death. I also described the possibility of  psoas weakness and paresthesias. I described in detail that improvement was not guaranteed.  The patient expressed understanding of these risks, and asked that we proceed with surgery. I described the surgery in layman's terms, and gave ample opportunity for questions, which were answered to the best of my ability.   I spent a total of 30 minutes in this patient's care today. This time was spent reviewing pertinent records including imaging studies, obtaining and confirming history, performing a directed evaluation, formulating and discussing my recommendations, and documenting the visit within the medical record.      Thank you for involving me in the care of this patient.      Idora Brosious K. Clois MD, [facility]

## 2024-01-04 NOTE — Telephone Encounter (Signed)
 Discontinued on 12/05/23.  Requested Prescriptions  Pending Prescriptions Disp Refills   Diclofenac -miSOPROStol  75-0.2 MG TBEC [Pharmacy Med Name: DICLOFENAC  SODIUM/MISOPROSTOL  75-0.2MG  TABLET DR] 60 tablet 1    Sig: TAKE (1) TABLET BY MOUTH TWICE DAILY     Not Delegated - Analgesics:  Antirheumatic Agents - diclofenac /misoprostol  Failed - 01/04/2024 11:22 AM      Failed - This refill cannot be delegated      Passed - Cr in normal range and within 360 days    Creatinine, Ser  Date Value Ref Range Status  05/18/2023 0.93 0.57 - 1.00 mg/dL Final         Passed - HGB in normal range and within 360 days    Hemoglobin  Date Value Ref Range Status  05/18/2023 12.6 11.1 - 15.9 g/dL Final         Passed - Patient is not pregnant      Passed - Valid encounter within last 12 months    Recent Outpatient Visits           1 month ago Essential hypertension   [facility], Leita DEL, MD   2 months ago Right foot pain   [place] [facility] at [facility], Selinda PARAS, MD   7 months ago Essential hypertension   [facility] at [facility], Leita DEL, MD

## 2024-01-04 NOTE — Telephone Encounter (Signed)
 Copied from CRM #[id number]. Topic: Clinical - Prescription Issue >> Jan 04, 2024  4:25 PM Delon DASEN wrote: Reason for CRM: wants to refill the Diclofenac -miSOPROStol  75-0.2 MG for twice a day, does not want to take the other medication that contradicts with this medication- she stopped taking the other medication [phone]

## 2024-01-05 ENCOUNTER — Other Ambulatory Visit: Payer: Self-pay | Admitting: Internal Medicine

## 2024-01-05 DIAGNOSIS — M797 Fibromyalgia: Secondary | ICD-10-CM

## 2024-01-05 MED ORDER — DICLOFENAC-MISOPROSTOL 75-0.2 MG PO TBEC: DELAYED_RELEASE_TABLET | ORAL | 1 refills | Status: DC

## 2024-01-05 NOTE — Progress Notes (Unsigned)
 Date:  01/05/2024   Name:  Carrie Myers   DOB:  03-Feb-1946   MRN:  [id number]   Chief Complaint: No chief complaint on file.  HPI  Review of Systems   Lab Results  Component Value Date   NA 144 05/18/2023   K 4.1 05/18/2023   CO2 25 05/18/2023   GLUCOSE 91 05/18/2023   BUN 17 05/18/2023   CREATININE 0.93 05/18/2023   CALCIUM 9.6 05/18/2023   EGFR 64 05/18/2023   GFRNONAA 57 (L) 03/28/2020   Lab Results  Component Value Date   CHOL 148 05/18/2023   HDL 59 05/18/2023   LDLCALC 64 05/18/2023   TRIG 146 05/18/2023   CHOLHDL 2.5 05/18/2023   Lab Results  Component Value Date   TSH 1.510 05/18/2023   Lab Results  Component Value Date   HGBA1C 6.4 (H) 05/18/2023   Lab Results  Component Value Date   WBC 5.8 05/18/2023   HGB 12.6 05/18/2023   HCT 38.1 05/18/2023   MCV 87 05/18/2023   PLT 303 05/18/2023   Lab Results  Component Value Date   ALT 21 05/18/2023   AST 25 05/18/2023   ALKPHOS 91 05/18/2023   BILITOT 0.3 05/18/2023   No results found for: MARIEN BOLLS, VD25OH   Patient Active Problem List   Diagnosis Date Noted   Right foot pain 11/01/2023   Arthralgia of left ankle 10/20/2021   Osteopenia determined by x-ray 03/31/2021   Primary osteoarthritis of left knee 01/22/2021   Effusion, right knee 12/15/2020   Primary osteoarthritis of right knee 11/24/2020   Overactive bladder 06/18/2020   Prediabetes 04/14/2020   Slow transit constipation 06/05/2019   Sciatica, left side 01/01/2019   Diverticulosis of colon 04/25/2018   Change in bowel habits    Tobacco use disorder, moderate, in sustained remission 12/22/2017   Moderate episode of recurrent major depressive disorder (HCC) 10/03/2017   Inflammatory spondylopathy of lumbar region 10/03/2017   Hoarseness, persistent 04/18/2017   OSA on CPAP 02/18/2017   Cervical disc disease 12/31/2016   Generalized osteoarthritis 12/23/2016   Chronic pain syndrome 12/23/2016    Cystocele, midline 12/06/2016   Urinary incontinence 12/06/2016   Impingement syndrome of right shoulder 04/17/2015   Fibrocystic breast 01/18/2015   Gastroesophageal reflux disease without esophagitis 01/18/2015   Hypothyroidism, postablative 01/18/2015   Arteriosclerosis of coronary artery 12/29/2014   DJD of shoulder 11/13/2014   Fibromyalgia 10/02/2014   Intercostal neuralgia 10/02/2014   Facet syndrome, lumbar 10/02/2014   Sacroiliac joint disease 10/02/2014   Carotid artery narrowing 07/09/2014   Essential hypertension 07/01/2014   Osteoarthritis of thumb 09/24/2012   Mixed hyperlipidemia 09/22/2012   Arthritis of hand, degenerative 09/22/2012    Allergies  Allergen Reactions   Amoxicillin-Pot Clavulanate Nausea And Vomiting and Other (See Comments)   Codeine Nausea And Vomiting and Other (See Comments)   Sulfa Antibiotics Nausea And Vomiting   Tolterodine  Diarrhea   Proparacaine Itching   Shellfish Allergy Nausea And Vomiting    No issues with iodine     Past Surgical History:  Procedure Laterality Date   ABDOMINAL HYSTERECTOMY     total   APPENDECTOMY     BREAST BIOPSY Right    neg   BREAST CYST ASPIRATION Left    neg   BREAST EXCISIONAL BIOPSY Right    BUNIONECTOMY Bilateral    CESAREAN SECTION     COLONOSCOPY  2010   normal   COLONOSCOPY WITH PROPOFOL  N/A 04/03/2018  Procedure: COLONOSCOPY WITH BIOPSIES;  Surgeon: Jinny Carmine, MD;  Location: [facility];  Service: Endoscopy;  Laterality: N/A;  sleep apnea   CORONARY ANGIOPLASTY WITH STENT PLACEMENT  04/2006   dental implant     [age]   DILATION AND CURETTAGE OF UTERUS     epidural steroid injection  2018   ESOPHAGOGASTRODUODENOSCOPY N/A 10/03/2020   Procedure: ESOPHAGOGASTRODUODENOSCOPY (EGD);  Surgeon: Maryruth Ole DASEN, MD;  Location: [facility] ENDOSCOPY;  Service: Endoscopy;  Laterality: N/A;  REQUESTING MORNING AFTER 9 AM   ESOPHAGOGASTRODUODENOSCOPY (EGD) WITH PROPOFOL  N/A 08/17/2022    Procedure: ESOPHAGOGASTRODUODENOSCOPY (EGD) WITH PROPOFOL ;  Surgeon: Maryruth Ole DASEN, MD;  Location: [facility] ENDOSCOPY;  Service: Endoscopy;  Laterality: N/A;   EYE SURGERY     bilateral cataract extraction   SHOULDER ARTHROSCOPY Right 05/07/2015   Procedure: Extensive arthroscopic debridement and arthroscopic subacromial decompression, right shoulder.                        2.  Steroid injection right thumb CMC joint.;  Surgeon: Norleen JINNY Maltos, MD;  Location: [facility];  Service: Orthopedics;  Laterality: Right;   TOOTH EXTRACTION     Upper centrals and lateral    Social History   Tobacco Use   Smoking status: Former    Current packs/day: 0.00    Average packs/day: 1.3 packs/day for 44.0 years (55.0 ttl pk-yrs)    Types: Cigarettes    Start date: 04/22/1962    Quit date: 04/22/2006    Years since quitting: 17.7    Passive exposure: Never   Smokeless tobacco: Never   Tobacco comments:       Vaping Use   Vaping status: Never Used  Substance Use Topics   Alcohol use: Not Currently   Drug use: Never     Medication list has been reviewed and updated.  No outpatient medications have been marked as taking for the 01/05/24 encounter (Orders Only) with Justus Leita DEL, MD.       11/28/2023    1:16 PM 05/18/2023    2:45 PM 05/21/2022    3:42 PM 04/21/2022   10:59 AM  GAD 7 : Generalized Anxiety Score  Nervous, Anxious, on Edge 0 0 0 3  Control/stop worrying 0 0 0 3  Worry too much - different things 0 0 0 3  Trouble relaxing 0 0 0 0  Restless 0 0 0 0  Easily annoyed or irritable 1 1 1 2   Afraid - awful might happen 0 0 0 0  Total GAD 7 Score 1 1 1 11   Anxiety Difficulty Not difficult at all Somewhat difficult Not difficult at all Very difficult       11/28/2023    1:16 PM 11/22/2023    1:53 PM 11/01/2023    3:35 PM  Depression screen PHQ 2/9  Decreased Interest 0 0 0  Down, Depressed, Hopeless 0 0 0  PHQ - 2 Score 0 0 0  Altered sleeping 0  0  Tired, decreased  energy 0  0  Change in appetite 0  0  Feeling bad or failure about yourself  0  0  Trouble concentrating 0  0  Moving slowly or fidgety/restless 0  0  Suicidal thoughts 0  0  PHQ-9 Score 0  0  Difficult doing work/chores Not difficult at all  Not difficult at all    BP Readings from Last 3 Encounters:  11/28/23 132/70  11/22/23 (!) 169/73  11/01/23  124/82    Physical Exam  Wt Readings from Last 3 Encounters:  11/28/23 178 lb (80.7 kg)  11/22/23 178 lb (80.7 kg)  11/01/23 176 lb (79.8 kg)    There were no vitals taken for this visit.  Assessment and Plan:  Problem List Items Addressed This Visit   None   No follow-ups on file.    Leita HILARIO Adie, MD [facility] [place]

## 2024-01-05 NOTE — Telephone Encounter (Signed)
 Spoke with patient and she said the other medication was Celebrex .  JM

## 2024-01-10 ENCOUNTER — Other Ambulatory Visit: Payer: Self-pay

## 2024-01-10 ENCOUNTER — Ambulatory Visit (INDEPENDENT_AMBULATORY_CARE_PROVIDER_SITE_OTHER): Admitting: Neurosurgery

## 2024-01-10 ENCOUNTER — Encounter: Payer: Self-pay | Admitting: Neurosurgery

## 2024-01-10 VITALS — BP 130/86 | Wt 180.0 lb

## 2024-01-10 DIAGNOSIS — M5441 Lumbago with sciatica, right side: Secondary | ICD-10-CM | POA: Diagnosis not present

## 2024-01-10 DIAGNOSIS — M4316 Spondylolisthesis, lumbar region: Secondary | ICD-10-CM | POA: Diagnosis not present

## 2024-01-10 DIAGNOSIS — M48062 Spinal stenosis, lumbar region with neurogenic claudication: Secondary | ICD-10-CM | POA: Diagnosis not present

## 2024-01-10 DIAGNOSIS — G8929 Other chronic pain: Secondary | ICD-10-CM | POA: Diagnosis not present

## 2024-01-10 DIAGNOSIS — Z01818 Encounter for other preprocedural examination: Secondary | ICD-10-CM

## 2024-01-10 DIAGNOSIS — M5442 Lumbago with sciatica, left side: Secondary | ICD-10-CM | POA: Diagnosis not present

## 2024-01-10 NOTE — Patient Instructions (Signed)
 Please see below for information in regards to your upcoming surgery:   Planned surgery: L4-5 Extreme Lateral Interbody Fusion and Posterior Spinal Fusion   Surgery date: 03/22/2023 at [facility] (Medical Mall: [address], [place], [place] [address]) - you will find out your arrival time the business day before your surgery.    Pre-op appointment at [facility] Pre-admit Testing: you will receive a call with a date/time for this appointment. If you are scheduled for an in person appointment, Pre-admit Testing is located on the first floor of the [facility] building, [address], [address]. During this appointment, they will advise you which medications you can take the morning of surgery, and which medications you will need to hold for surgery. Labs (such as blood work, EKG) may be done at your pre-op appointment. You are not required to fast for these labs. Should you need to change your pre-op appointment, please call Pre-admit testing at [phone].      Blood thinners:  Aspirin 81mg :  OK to stay on aspirin 81mg      Surgical clearance: we will send a clearance form to Leita Adie, MD & [facility] (Cardiology). They may wish to see you in their office prior to signing the clearance form. If so, they may call you to schedule an appointment.      NSAIDS (Non-steroidal anti-inflammatory drugs): because you are having a fusion, please avoid taking any NSAIDS (examples: ibuprofen, motrin, aleve, naproxen, meloxicam , diclofenac ) for 3 months after surgery. Celebrex  is an exception and is OK to take, if prescribed. Tylenol  is not an NSAID.     Common restrictions after spine surgery: No bending, lifting, or twisting ("BLT"). Avoid lifting objects heavier than 10 pounds for the first 6 weeks after surgery. Where possible, avoid household activities that involve lifting, bending, reaching, pushing, or pulling such as laundry,  vacuuming, grocery shopping, and childcare. Try to arrange for help from friends and family for these activities while you heal. Do not drive while taking prescription pain medication. Weeks 6 through 12 after surgery: avoid lifting more than 25 pounds.     X-rays after surgery: Because you are having a fusion or arthroplasty: for appointments after your 2 week follow-up: please arrive our office 30 minutes prior to your appointment for x-rays. This applies to every appointment after your 2 week follow-up. Failure to do so may result in your appointment being rescheduled.     How to contact us :  If you have any questions/concerns before or after surgery, you can reach us  at [phone], or you can send a mychart message. We can be reached by phone or mychart 8am-4pm, Monday-Friday.  *Please note: Calls after 4pm are forwarded to a third party answering service. Mychart messages are not routinely monitored during evenings, weekends, and holidays. Please call our office to contact the answering service for urgent concerns during non-business hours.     If you have FMLA/disability paperwork, please drop it off or fax it to [phone]   Appointments/FMLA & disability paperwork: Reche Hait, & Nichole Registered Nurse/Surgery scheduler: Kendelyn, RN & Katie, RN Certified Medical Assistants: Don, CMA, Elenor, CMA, Damien, CMA, & Auston, [place] Physician Assistants: Lyle Decamp, PA-C, Edsel Goods, PA-C & Glade Boys, PA-C Surgeons: Penne Sharps, MD & Reeves Daisy, MD     [facility] PREADMIT TESTING VISIT and SURGERY INFORMATION SHEET   Now that surgery has been scheduled you can anticipate several phone calls from [facility] services. A pharmacy technician will call you to verify your current list of medications taken at home.               The Pre-Service Center will call to verify your insurance information and to give you billing estimates and  information.             The Preadmit Testing Office will be calling to schedule a visit to obtain information for the anesthesia team and provide instructions on preparation for surgery.  What can you expect for the Preadmit Testing Visit: Appointments may be scheduled in-person or by telephone.  If a telephone visit is scheduled, you may be asked to come into the office to have lab tests or other studies performed.   This visit will not be completed any greater than 14 days prior to your surgery.  If your surgery has been scheduled for a future date, please do not be alarmed if we have not contacted you to schedule an appointment more than a month prior to the surgery date.    Please be prepared to provide the following information during this appointment:            -Personal medical history                                               -Medication and allergy list            -Any history of problems with anesthesia              -Recent lab work or diagnostic studies            -Please notify us  of any needs we should be aware of to provide the best care possible           -You will be provided with instructions on how to prepare for your surgery.    On The Day of Surgery:  You must have a driver to take you home after surgery, you will be asked not to drive for 24 hours following surgery.  Taxi, Gisele and non-medical transport will not be acceptable means of transportation unless you have a responsible individual who will be traveling with you.  Visitors in the surgical area:   2 people will be able to visit you in your room once your preparation for surgery has been completed. During surgery, your visitors will be asked to wait in the Surgery Waiting Area.  It is not a requirement for them to stay, if they prefer to leave and come back.  Your visitor(s) will be given an update once the surgery has been completed.  No visitors are allowed in the initial recovery room to respect patient  privacy and safety.  Once you are more awake and transfer to the secondary recovery area, or are transferred to an inpatient room, visitors will again be able to see you.  To respect and protect your privacy: We will ask on the day of surgery who your driver will be and what the contact number for that individual will be. We will ask if it is okay to share information with this individual, or if there is an alternative individual that we, or the surgeon, should contact to provide updates and information. If family or friends come to the surgical information desk requesting information about you, who you  have not listed with us , no information will be given.   It may be helpful to designate someone as the main contact who will be responsible for updating your other friends and family.    PREADMIT TESTING OFFICE: [phone] SAME DAY SURGERY: [phone] We look forward to caring for you before and throughout the process of your surgery.

## 2024-01-10 NOTE — Addendum Note (Signed)
 Addended by: Karsyn Jamie E on: 01/10/2024 04:43 PM   Modules accepted: Orders

## 2024-01-30 ENCOUNTER — Ambulatory Visit: Attending: Neurosurgery | Admitting: Physical Therapy

## 2024-01-30 DIAGNOSIS — R262 Difficulty in walking, not elsewhere classified: Secondary | ICD-10-CM | POA: Insufficient documentation

## 2024-01-30 DIAGNOSIS — M48062 Spinal stenosis, lumbar region with neurogenic claudication: Secondary | ICD-10-CM | POA: Insufficient documentation

## 2024-01-30 DIAGNOSIS — M6281 Muscle weakness (generalized): Secondary | ICD-10-CM | POA: Insufficient documentation

## 2024-01-30 DIAGNOSIS — M545 Low back pain, unspecified: Secondary | ICD-10-CM | POA: Insufficient documentation

## 2024-01-30 DIAGNOSIS — M5459 Other low back pain: Secondary | ICD-10-CM | POA: Insufficient documentation

## 2024-01-30 DIAGNOSIS — G8929 Other chronic pain: Secondary | ICD-10-CM | POA: Insufficient documentation

## 2024-01-30 NOTE — Therapy (Deleted)
 OUTPATIENT PHYSICAL THERAPY THORACOLUMBAR EVALUATION   Patient Name: Carrie Myers MRN: [id number] DOB:1945-09-02, 78 y.o., female Today's Date: 01/30/2024  END OF SESSION:   Past Medical History:  Diagnosis Date   Allergy    Anxiety    Arthritis    neck, hands, lower back   Bilateral dry eyes    Chronic low back pain 12/23/2016   Coronary artery disease    DDD (degenerative disc disease), lumbar 10/02/2014   Dental crowns present    Implants, front flipper while waiting for other implants   Emphysema of lung (HCC)    Fibromyalgia    Fibromyalgia    Fracture of right foot 01/14/2015   GERD (gastroesophageal reflux disease)    History of Graves' disease    Hyperlipidemia    Hypertension    Hypothyroidism    Myocardial infarction (HCC) 04/22/2006   Osteoarthritis    Other shoulder lesions, right shoulder 04/17/2015   Rheumatoid arthritis (HCC)    Sciatica, left side 01/01/2019   Sleep apnea    CPAP   Status post shoulder surgery 05/27/2015   Thyroid  disease    Vertigo    none for approx 9 yrs   Past Surgical History:  Procedure Laterality Date   ABDOMINAL HYSTERECTOMY     total   APPENDECTOMY     BREAST BIOPSY Right    neg   BREAST CYST ASPIRATION Left    neg   BREAST EXCISIONAL BIOPSY Right    BUNIONECTOMY Bilateral    CESAREAN SECTION     COLONOSCOPY  2010   normal   COLONOSCOPY WITH PROPOFOL  N/A 04/03/2018   Procedure: COLONOSCOPY WITH BIOPSIES;  Surgeon: Jinny Carmine, MD;  Location: [facility];  Service: Endoscopy;  Laterality: N/A;  sleep apnea   CORONARY ANGIOPLASTY WITH STENT PLACEMENT  04/2006   dental implant     [age]   DILATION AND CURETTAGE OF UTERUS     epidural steroid injection  2018   ESOPHAGOGASTRODUODENOSCOPY N/A 10/03/2020   Procedure: ESOPHAGOGASTRODUODENOSCOPY (EGD);  Surgeon: Maryruth Ole DASEN, MD;  Location: [facility];  Service: Endoscopy;  Laterality: N/A;  REQUESTING MORNING AFTER 9 AM    ESOPHAGOGASTRODUODENOSCOPY (EGD) WITH PROPOFOL  N/A 08/17/2022   Procedure: ESOPHAGOGASTRODUODENOSCOPY (EGD) WITH PROPOFOL ;  Surgeon: Maryruth Ole DASEN, MD;  Location: [facility] ENDOSCOPY;  Service: Endoscopy;  Laterality: N/A;   EYE SURGERY     bilateral cataract extraction   SHOULDER ARTHROSCOPY Right 05/07/2015   Procedure: Extensive arthroscopic debridement and arthroscopic subacromial decompression, right shoulder.                        2.  Steroid injection right thumb CMC joint.;  Surgeon: Norleen JINNY Maltos, MD;  Location: [facility];  Service: Orthopedics;  Laterality: Right;   TOOTH EXTRACTION     Upper centrals and lateral   Patient Active Problem List   Diagnosis Date Noted   Right foot pain 11/01/2023   Arthralgia of left ankle 10/20/2021   Osteopenia determined by x-ray 03/31/2021   Primary osteoarthritis of left knee 01/22/2021   Effusion, right knee 12/15/2020   Primary osteoarthritis of right knee 11/24/2020   Overactive bladder 06/18/2020   Prediabetes 04/14/2020   Slow transit constipation 06/05/2019   Sciatica, left side 01/01/2019   Diverticulosis of colon 04/25/2018   Change in bowel habits    Tobacco use disorder, moderate, in sustained remission 12/22/2017   Moderate episode of recurrent major depressive disorder (HCC) 10/03/2017  Inflammatory spondylopathy of lumbar region 10/03/2017   Hoarseness, persistent 04/18/2017   OSA on CPAP 02/18/2017   Cervical disc disease 12/31/2016   Generalized osteoarthritis 12/23/2016   Chronic pain syndrome 12/23/2016   Cystocele, midline 12/06/2016   Urinary incontinence 12/06/2016   Impingement syndrome of right shoulder 04/17/2015   Fibrocystic breast 01/18/2015   Gastroesophageal reflux disease without esophagitis 01/18/2015   Hypothyroidism, postablative 01/18/2015   Arteriosclerosis of coronary artery 12/29/2014   DJD of shoulder 11/13/2014   Fibromyalgia 10/02/2014   Intercostal neuralgia 10/02/2014   Facet  syndrome, lumbar 10/02/2014   Sacroiliac joint disease 10/02/2014   Carotid artery narrowing 07/09/2014   Essential hypertension 07/01/2014   Osteoarthritis of thumb 09/24/2012   Mixed hyperlipidemia 09/22/2012   Arthritis of hand, degenerative 09/22/2012    PCP: Justus Leita DEL, MD  REFERRING PROVIDER: Justus Leita DEL, MD  REFERRING DIAG: ***  RATIONALE FOR EVALUATION AND TREATMENT: {HABREHAB:27488}  THERAPY DIAG: No diagnosis found.  ONSET DATE: ***  FOLLOW-UP APPT SCHEDULED WITH REFERRING PROVIDER: {yes/no:20286}    SUBJECTIVE:                                                                                                                                                                                         SUBJECTIVE STATEMENT:  ***  PERTINENT HISTORY: ***  PAIN:    Pain Intensity: Present: /10, Best: /10, Worst: /10 Pain location: *** Pain Quality: {PAIN DESCRIPTION:21022940}  Radiating: {yes/no:20286}  Numbness/Tingling: {yes/no:20286} Focal Weakness: {yes/no:20286} Aggravating factors: *** Relieving factors: *** 24-hour pain behavior: *** How long can you sit: How long can you stand: History of prior back injury, pain, surgery, or therapy: {yes/no:20286} Dominant hand: {RIGHT/LEFT:20294} Imaging: {yes/no:20286}  Red flags: Negative for bowel/bladder changes, saddle paresthesia, personal history of cancer, h/o spinal tumors, h/o compression fx, h/o abdominal aneurysm, abdominal pain, chills/fever, night sweats, nausea, vomiting, unrelenting pain, first onset of insidious LBP <20 y/o  PRECAUTIONS: {Therapy precautions:24002}  WEIGHT BEARING RESTRICTIONS: {Yes ***/No:24003}  FALLS: Has patient fallen in last 6 months? {fallsyesno:27318}  Living Environment Lives with: {OPRC lives with:25569::lives with their family} Lives in: {Lives in:25570} Stairs: {opstairs:27293} Has following equipment at home: {Assistive devices:23999}  Prior level of  function: {PLOF:24004}  Occupational demands:   Hobbies:   Patient Goals: ***   OBJECTIVE:  Patient Surveys  {rehab surveys:24030}  Cognition Patient is oriented to person, place, and time.  Recent memory is intact.  Remote memory is intact.  Attention span and concentration are intact.  Expressive speech is intact.  Patient's fund of knowledge is within normal limits for educational level.    Gross Musculoskeletal Assessment Tremor: None Bulk:  Normal Tone: Normal No visible step-off along spinal column, no signs of scoliosis  GAIT: Distance walked: *** Assistive device utilized: {Assistive devices:23999} Level of assistance: {Levels of assistance:24026} Comments: ***  Posture: Lumbar lordosis: WNL Iliac crest height: Equal bilaterally Lumbar lateral shift: Negative  AROM AROM (Normal range in degrees) AROM   Lumbar   Flexion (65)   Extension (30)   Right lateral flexion (25)   Left lateral flexion (25)   Right rotation (30)   Left rotation (30)       Hip Right Left  Flexion (125)    Extension (15)    Abduction (40)    Adduction     Internal Rotation (45)    External Rotation (45)        Knee    Flexion (135)    Extension (0)        Ankle    Dorsiflexion (20)    Plantarflexion (50)    Inversion (35)    Eversion (15)    (* = pain; Blank rows = not tested)  LE MMT: MMT (out of 5) Right  Left   Hip flexion    Hip extension    Hip abduction    Hip adduction    Hip internal rotation    Hip external rotation    Knee flexion    Knee extension    Ankle dorsiflexion    Ankle plantarflexion    Ankle inversion    Ankle eversion    (* = pain; Blank rows = not tested)  Sensation Grossly intact to light touch throughout bilateral LEs as determined by testing dermatomes L2-S2. Proprioception, stereognosis, and hot/cold testing deferred on this date.  Reflexes R/L Knee Jerk (L3/4): 2+/2+  Ankle Jerk (S1/2): 2+/2+   Muscle  Length Hamstrings: R: {NEGATIVE/POSITIVE QNM:80001} L: {NEGATIVE/POSITIVE QNM:80001} Ely (quadriceps): R: {NEGATIVE/POSITIVE QNM:80001} L: {NEGATIVE/POSITIVE QNM:80001} Thomas (hip flexors): R: {NEGATIVE/POSITIVE QNM:80001} L: {NEGATIVE/POSITIVE QNM:80001} Ober: R: {NEGATIVE/POSITIVE QNM:80001} L: {NEGATIVE/POSITIVE QNM:80001}  Palpation Location Right Left         Lumbar paraspinals    Quadratus Lumborum    Gluteus Maximus    Gluteus Medius    Deep hip external rotators    PSIS    Fortin's Area (SIJ)    Greater Trochanter    (Blank rows = not tested) Graded on 0-4 scale (0 = no pain, 1 = pain, 2 = pain with wincing/grimacing/flinching, 3 = pain with withdrawal, 4 = unwilling to allow palpation)  Passive Accessory  Motion Pt denies reproduction of back pain with CPA L1-L5 and UPA bilaterally L1-L5. Generally, hypomobile throughout  Special Tests Lumbar Radiculopathy and Discogenic: Centralization and Peripheralization (SN 92, -LR 0.12): {NEGATIVE/POSITIVE FOR:19998} Slump (SN 83, -LR 0.32): R: {NEGATIVE/POSITIVE FOR:19998} L: {NEGATIVE/POSITIVE FOR:19998} SLR (SN 92, -LR 0.29): R: {NEGATIVE/POSITIVE QNM:80001} L:  {NEGATIVE/POSITIVE QNM:80001} Crossed SLR (SP 90): R: {NEGATIVE/POSITIVE QNM:80001} L: {NEGATIVE/POSITIVE QNM:80001}  Facet Joint: Extension-Rotation (SN 100, -LR 0.0): R: {NEGATIVE/POSITIVE QNM:80001} L: {NEGATIVE/POSITIVE QNM:80001}  Lumbar Foraminal Stenosis: Lumbar quadrant (SN 70): R: {NEGATIVE/POSITIVE QNM:80001} L: {NEGATIVE/POSITIVE QNM:80001}  Hip: FABER (SN 81): R: {NEGATIVE/POSITIVE QNM:80001} L: {NEGATIVE/POSITIVE QNM:80001} FADIR (SN 94): R: {NEGATIVE/POSITIVE FOR:19998} L: {NEGATIVE/POSITIVE QNM:80001} Hip scour (SN 50): R: {NEGATIVE/POSITIVE QNM:80001} L: {NEGATIVE/POSITIVE QNM:80001}  SIJ:  Thigh Thrust (SN 88, -LR 0.18) : R: {NEGATIVE/POSITIVE QNM:80001} L: {NEGATIVE/POSITIVE QNM:80001}  Piriformis Syndrome: FAIR Test (SN 88, SP 83): R:  {NEGATIVE/POSITIVE QNM:80001} L: {NEGATIVE/POSITIVE QNM:80001}  Functional Tasks Lifting: Deep squat: Sit to stand: Forward Step-Down Test: R:  L:  Lateral Step-Down Test: R:  L:   Beighton scale  LEFT  RIGHT           1. Passive dorsiflexion and hyperextension of the fifth MCP joint beyond 90  0 0   2. Passive apposition of the thumb to the flexor aspect of the forearm  0  0   3. Passive hyperextension of the elbow beyond 10  0  0   4. Passive hyperextension of the knee beyond 10  0  0   5. Active forward flexion of the trunk with the knees fully extended so that the palms of the hands rest flat on the floor   0   TOTAL         0/ 9      TODAY'S TREATMENT: DATE: ***     PATIENT EDUCATION:  Education details: *** Person educated: {Person educated:25204} Education method: {Education Method:25205} Education comprehension: {Education Comprehension:25206}   HOME EXERCISE PROGRAM:     ASSESSMENT:  CLINICAL IMPRESSION: Patient is a *** y.o. *** who was seen today for physical therapy evaluation and treatment for ***.   OBJECTIVE IMPAIRMENTS: {opptimpairments:25111}.   ACTIVITY LIMITATIONS: {activitylimitations:27494}  PARTICIPATION LIMITATIONS: {participationrestrictions:25113}  PERSONAL FACTORS: {Personal factors:25162} are also affecting patient's functional outcome.   REHAB POTENTIAL: {rehabpotential:25112}  CLINICAL DECISION MAKING: {clinical decision making:25114}  EVALUATION COMPLEXITY: {Evaluation complexity:25115}   GOALS: Goals reviewed with patient? {yes/no:20286}  SHORT TERM GOALS: Target date: {follow up:25551}  Pt will be independent with HEP in order to improve strength and decrease back pain to improve pain-free function at home and work. Baseline: *** Goal status: INITIAL   LONG TERM GOALS: Target date: {follow up:25551}  Pt will increase FOTO to at least *** to demonstrate significant improvement in function at home and work related to  back pain  Baseline:  Goal status: INITIAL  2.  Pt will decrease worst back pain by at least 2 points on the NPRS in order to demonstrate clinically significant reduction in back pain. Baseline: *** Goal status: INITIAL  3.  Pt will decrease mODI score by at least 13 points in order demonstrate clinically significant reduction in back pain/disability.       Baseline: *** Goal status: INITIAL  4.  *** Baseline: *** Goal status: INITIAL   PLAN: PT FREQUENCY: 1-2x/week  PT DURATION: {rehab duration:25117}  PLANNED INTERVENTIONS: Therapeutic exercises, Therapeutic activity, Neuromuscular re-education, Balance training, Gait training, Patient/Family education, Self Care, Joint mobilization, Joint manipulation, Vestibular training, Canalith repositioning, Orthotic/Fit training, DME instructions, Dry Needling, Electrical stimulation, Spinal manipulation, Spinal mobilization, Cryotherapy, Moist heat, Taping, Traction, Ultrasound, Ionotophoresis 4mg /ml Dexamethasone , Manual therapy, and Re-evaluation.  PLAN FOR NEXT SESSION: ***   Venetia Endo, PT, DPT [phone]  Venetia ONEIDA Endo, PT 01/30/2024, 7:38 AM

## 2024-02-01 ENCOUNTER — Telehealth: Payer: Self-pay | Admitting: Neurosurgery

## 2024-02-01 NOTE — Telephone Encounter (Signed)
 Patient called and said she would like to have have the opinion of Dr Clois on whether he thinks it would be a good idea to keep her small little dog that weighs about 6 pounds. Carrie Myers has surgery 03/21/24 and said she has someone to help her with the dog for the first 6 weeks of recovery. She said she just has concerns afterwards that if having the dog would be a risk for her, like having to ben over or possibly tripping over her. She didn't know if Dr Clois would think it would be more beneficial to keep the dog or give her to another home. She said she wasn't sure who to ask and thought since the provider has done these types of surgeries before that he might have some insight. Please advise. She said she can come in to talk with the provider if he would like.

## 2024-02-02 ENCOUNTER — Ambulatory Visit: Admitting: Physical Therapy

## 2024-02-02 NOTE — Telephone Encounter (Signed)
 Patient advised. Patient wanted me to tell Dr Clois she said Thank you for taking the time to read her message and reply. She appreciates us .

## 2024-02-02 NOTE — Therapy (Deleted)
 OUTPATIENT PHYSICAL THERAPY THORACOLUMBAR EVALUATION   Patient Name: Carrie Myers MRN: [id number] DOB:[birth year], 78 y.o., female Today's Date: 02/02/2024  END OF SESSION:   Past Medical History:  Diagnosis Date   Allergy    Anxiety    Arthritis    neck, hands, lower back   Bilateral dry eyes    Chronic low back pain 12/23/2016   Coronary artery disease    DDD (degenerative disc disease), lumbar 10/02/2014   Dental crowns present    Implants, front flipper while waiting for other implants   Emphysema of lung (HCC)    Fibromyalgia    Fibromyalgia    Fracture of right foot 01/14/2015   GERD (gastroesophageal reflux disease)    History of Graves' disease    Hyperlipidemia    Hypertension    Hypothyroidism    Myocardial infarction (HCC) 04/22/2006   Osteoarthritis    Other shoulder lesions, right shoulder 04/17/2015   Rheumatoid arthritis (HCC)    Sciatica, left side 01/01/2019   Sleep apnea    CPAP   Status post shoulder surgery 05/27/2015   Thyroid  disease    Vertigo    none for approx 9 yrs   Past Surgical History:  Procedure Laterality Date   ABDOMINAL HYSTERECTOMY     total   APPENDECTOMY     BREAST BIOPSY Right    neg   BREAST CYST ASPIRATION Left    neg   BREAST EXCISIONAL BIOPSY Right    BUNIONECTOMY Bilateral    CESAREAN SECTION     COLONOSCOPY  2010   normal   COLONOSCOPY WITH PROPOFOL  N/A 04/03/2018   Procedure: COLONOSCOPY WITH BIOPSIES;  Surgeon: Jinny Carmine, MD;  Location: [facility];  Service: Endoscopy;  Laterality: N/A;  sleep apnea   CORONARY ANGIOPLASTY WITH STENT PLACEMENT  04/2006   dental implant     [age]   DILATION AND CURETTAGE OF UTERUS     epidural steroid injection  2018   ESOPHAGOGASTRODUODENOSCOPY N/A 10/03/2020   Procedure: ESOPHAGOGASTRODUODENOSCOPY (EGD);  Surgeon: Maryruth Ole DASEN, MD;  Location: [facility];  Service: Endoscopy;  Laterality: N/A;  REQUESTING MORNING AFTER 9 AM    ESOPHAGOGASTRODUODENOSCOPY (EGD) WITH PROPOFOL  N/A 08/17/2022   Procedure: ESOPHAGOGASTRODUODENOSCOPY (EGD) WITH PROPOFOL ;  Surgeon: Maryruth Ole DASEN, MD;  Location: [facility] ENDOSCOPY;  Service: Endoscopy;  Laterality: N/A;   EYE SURGERY     bilateral cataract extraction   SHOULDER ARTHROSCOPY Right 05/07/2015   Procedure: Extensive arthroscopic debridement and arthroscopic subacromial decompression, right shoulder.                        2.  Steroid injection right thumb CMC joint.;  Surgeon: Norleen JINNY Maltos, MD;  Location: [facility];  Service: Orthopedics;  Laterality: Right;   TOOTH EXTRACTION     Upper centrals and lateral   Patient Active Problem List   Diagnosis Date Noted   Right foot pain 11/01/2023   Arthralgia of left ankle 10/20/2021   Osteopenia determined by x-ray 03/31/2021   Primary osteoarthritis of left knee 01/22/2021   Effusion, right knee 12/15/2020   Primary osteoarthritis of right knee 11/24/2020   Overactive bladder 06/18/2020   Prediabetes 04/14/2020   Slow transit constipation 06/05/2019   Sciatica, left side 01/01/2019   Diverticulosis of colon 04/25/2018   Change in bowel habits    Tobacco use disorder, moderate, in sustained remission 12/22/2017   Moderate episode of recurrent major depressive disorder (HCC) 10/03/2017  Inflammatory spondylopathy of lumbar region 10/03/2017   Hoarseness, persistent 04/18/2017   OSA on CPAP 02/18/2017   Cervical disc disease 12/31/2016   Generalized osteoarthritis 12/23/2016   Chronic pain syndrome 12/23/2016   Cystocele, midline 12/06/2016   Urinary incontinence 12/06/2016   Impingement syndrome of right shoulder 04/17/2015   Fibrocystic breast 01/18/2015   Gastroesophageal reflux disease without esophagitis 01/18/2015   Hypothyroidism, postablative 01/18/2015   Arteriosclerosis of coronary artery 12/29/2014   DJD of shoulder 11/13/2014   Fibromyalgia 10/02/2014   Intercostal neuralgia 10/02/2014   Facet  syndrome, lumbar 10/02/2014   Sacroiliac joint disease 10/02/2014   Carotid artery narrowing 07/09/2014   Essential hypertension 07/01/2014   Osteoarthritis of thumb 09/24/2012   Mixed hyperlipidemia 09/22/2012   Arthritis of hand, degenerative 09/22/2012    PCP: Justus Leita DEL, MD  REFERRING PROVIDER: Clois Fret, MD  REFERRING DIAG: [phone] (ICD-10-CM) - Spondylolisthesis of lumbar region   RATIONALE FOR EVALUATION AND TREATMENT: Rehabilitation  THERAPY DIAG: Other low back pain  Difficulty in walking, not elsewhere classified  Muscle weakness (generalized)  ONSET DATE: ***  FOLLOW-UP APPT SCHEDULED WITH REFERRING PROVIDER: {yes/no:20286}    SUBJECTIVE:                                                                                                                                                                                         SUBJECTIVE STATEMENT:  Pt is a 78 year old female with hx of chronic back pain/lumbar spinal stenosis.   PERTINENT HISTORY: Pt is a 78 year old female with hx of chronic back pain/lumbar spinal stenosis.   She now has lumbar fusion planned for 03/21/2024. She has experienced significant back pain for the past ten years, initially affecting her left sciatic nerve. Pain management injections were effective until September, but the pain has worsened over the past two months. The pain is centered in her lower back, radiating down both legs, and is exacerbated by walking or standing for prolonged periods. She uses a walking stick for support and wears a brace when active.   Pt has known grade 1 anterolisthesis at L4-5. MRI shows facet osteoarthritis focally advanced at L4-5. Focally advanced disc degeneration at L5-S1 with progression from 2011. Mild foraminal narrowing at this level.  PAIN:    Pain Intensity: Present: /10, Best: /10, Worst: /10 Pain location: *** Pain Quality: {PAIN DESCRIPTION:21022940}  Radiating: {yes/no:20286}   Numbness/Tingling: {yes/no:20286} Focal Weakness: {yes/no:20286} Aggravating factors: *** Relieving factors: *** 24-hour pain behavior: *** How long can you sit: How long can you stand: History of prior back injury, pain, surgery, or therapy: {yes/no:20286} Dominant hand: {RIGHT/LEFT:20294} Imaging: Yes ;  MR Lumbar spine;  05/24/23 Facet osteoarthritis focally advanced at L4-5 where there is anterolisthesis and advanced spinal stenosis that is progressed from 2011. Focally advanced disc degeneration at L5-S1 with progression from 2011. Mild foraminal narrowing at this level.   Red flags: Negative for bowel/bladder changes, saddle paresthesia, personal history of cancer, h/o spinal tumors, h/o compression fx, h/o abdominal aneurysm, abdominal pain, chills/fever, night sweats, nausea, vomiting, unrelenting pain, first onset of insidious LBP <20 y/o  PRECAUTIONS: {Therapy precautions:24002}  WEIGHT BEARING RESTRICTIONS: {Yes ***/No:24003}  FALLS: Has patient fallen in last 6 months? {fallsyesno:27318}  Living Environment Lives with: {[facility] lives with:25569::lives with their family} Lives in: {Lives in:25570} Stairs: {opstairs:27293} Has following equipment at home: {Assistive devices:23999}  Prior level of function: {PLOF:24004}  Occupational demands:   Hobbies:   Patient Goals: ***   OBJECTIVE:  Patient Surveys  {rehab surveys:24030}  Cognition Patient is oriented to person, place, and time.  Recent memory is intact.  Remote memory is intact.  Attention span and concentration are intact.  Expressive speech is intact.  Patient's fund of knowledge is within normal limits for educational level.    Gross Musculoskeletal Assessment Tremor: None Bulk: Normal Tone: Normal No visible step-off along spinal column, no signs of scoliosis  GAIT: Distance walked: *** Assistive device utilized: {Assistive devices:23999} Level of assistance: {Levels of  assistance:24026} Comments: ***  Posture: Lumbar lordosis: WNL Iliac crest height: Equal bilaterally Lumbar lateral shift: Negative  AROM AROM (Normal range in degrees) AROM  02/02/2024  Lumbar   Flexion (65)   Extension (30)   Right lateral flexion (25)   Left lateral flexion (25)   Right rotation (30)   Left rotation (30)       Hip Right Left  Flexion (125)    Extension (15)    Abduction (40)    Adduction     Internal Rotation (45)    External Rotation (45)        Knee    Flexion (135)    Extension (0)        Ankle    Dorsiflexion (20)    Plantarflexion (50)    Inversion (35)    Eversion (15)    (* = pain; Blank rows = not tested)  LE MMT: MMT (out of 5) Right 02/02/2024 Left 02/02/2024  Hip flexion    Hip extension    Hip abduction    Hip adduction    Hip internal rotation    Hip external rotation    Knee flexion    Knee extension    Ankle dorsiflexion    Ankle plantarflexion    Ankle inversion    Ankle eversion    (* = pain; Blank rows = not tested)  Sensation Grossly intact to light touch throughout bilateral LEs as determined by testing dermatomes L2-S2. Proprioception, stereognosis, and hot/cold testing deferred on this date.  Reflexes R/L Knee Jerk (L3/4): 2+/2+  Ankle Jerk (S1/2): 2+/2+   Muscle Length Hamstrings: R: {NEGATIVE/POSITIVE QNM:80001} L: {NEGATIVE/POSITIVE QNM:80001} Ely (quadriceps): R: {NEGATIVE/POSITIVE QNM:80001} L: {NEGATIVE/POSITIVE QNM:80001} Thomas (hip flexors): R: {NEGATIVE/POSITIVE QNM:80001} L: {NEGATIVE/POSITIVE QNM:80001} Ober: R: {NEGATIVE/POSITIVE QNM:80001} L: {NEGATIVE/POSITIVE QNM:80001}  Palpation Location Right Left         Lumbar paraspinals    Quadratus Lumborum    Gluteus Maximus    Gluteus Medius    Deep hip external rotators    PSIS    Fortin's Area (SIJ)    Greater Trochanter    (Blank rows = not tested) Graded on 0-4 scale (  0 = no pain, 1 = pain, 2 = pain with wincing/grimacing/flinching,  3 = pain with withdrawal, 4 = unwilling to allow palpation)  Passive Accessory  Motion Pt denies reproduction of back pain with CPA L1-L5 and UPA bilaterally L1-L5. Generally, hypomobile throughout  Special Tests Lumbar Radiculopathy and Discogenic: Centralization and Peripheralization (SN 92, -LR 0.12): {NEGATIVE/POSITIVE FOR:19998} Slump (SN 83, -LR 0.32): R: {NEGATIVE/POSITIVE FOR:19998} L: {NEGATIVE/POSITIVE FOR:19998} SLR (SN 92, -LR 0.29): R: {NEGATIVE/POSITIVE QNM:80001} L:  {NEGATIVE/POSITIVE QNM:80001} Crossed SLR (SP 90): R: {NEGATIVE/POSITIVE QNM:80001} L: {NEGATIVE/POSITIVE QNM:80001}  Facet Joint: Extension-Rotation (SN 100, -LR 0.0): R: {NEGATIVE/POSITIVE QNM:80001} L: {NEGATIVE/POSITIVE QNM:80001}  Lumbar Foraminal Stenosis: Lumbar quadrant (SN 70): R: {NEGATIVE/POSITIVE QNM:80001} L: {NEGATIVE/POSITIVE QNM:80001}  Hip: FABER (SN 81): R: {NEGATIVE/POSITIVE QNM:80001} L: {NEGATIVE/POSITIVE QNM:80001} FADIR (SN 94): R: {NEGATIVE/POSITIVE FOR:19998} L: {NEGATIVE/POSITIVE QNM:80001} Hip scour (SN 50): R: {NEGATIVE/POSITIVE QNM:80001} L: {NEGATIVE/POSITIVE QNM:80001}  SIJ:  Thigh Thrust (SN 88, -LR 0.18) : R: {NEGATIVE/POSITIVE QNM:80001} L: {NEGATIVE/POSITIVE QNM:80001}  Piriformis Syndrome: FAIR Test (SN 88, SP 83): R: {NEGATIVE/POSITIVE QNM:80001} L: {NEGATIVE/POSITIVE QNM:80001}  Functional Tasks Lifting: Deep squat: Sit to stand: Forward Step-Down Test: R:  L:  Lateral Step-Down Test: R:  L:   Beighton scale  LEFT  RIGHT           1. Passive dorsiflexion and hyperextension of the fifth MCP joint beyond 90  0 0   2. Passive apposition of the thumb to the flexor aspect of the forearm  0  0   3. Passive hyperextension of the elbow beyond 10  0  0   4. Passive hyperextension of the knee beyond 10  0  0   5. Active forward flexion of the trunk with the knees fully extended so that the palms of the hands rest flat on the floor   0   TOTAL         0/ 9       TODAY'S TREATMENT: DATE: 02/02/2024    Therapeutic Exercise - for HEP establishment, discussion on appropriate exercise/activity modification, PT education   Reviewed baseline home exercises and provided handout for [facility] program (see Access Code); tactile cueing and therapist demonstration utilized as needed for carryover of proper technique to HEP.        PATIENT EDUCATION:  Education details: see above for patient education details Person educated: {Person educated:25204} Education method: {Education Method:25205} Education comprehension: {Education Comprehension:25206}   HOME EXERCISE PROGRAM:     ASSESSMENT:  CLINICAL IMPRESSION: Patient is a 78 y.o. female who was seen today for physical therapy evaluation and treatment for chronic low back pain in setting of known spondylosis and lumbar spinal stenosis with low-grade anterolisthesis at L4-5; mild L5-S1 foraminal narrowing. Pt has fusion planned for March 21, 2024 (1 level, anterior lateral fusion).   OBJECTIVE IMPAIRMENTS: {opptimpairments:25111}.   ACTIVITY LIMITATIONS: {activitylimitations:27494}  PARTICIPATION LIMITATIONS: {participationrestrictions:25113}  PERSONAL FACTORS: Age, Past/current experiences, Time since onset of injury/illness/exacerbation, and 3+ comorbidities: (anxiety, CAD, emphysema, fibromyalgia, Grave's disease, RA, OA, HLD, HTN) are also affecting patient's functional outcome.   REHAB POTENTIAL: Fair due to significant comorbidities, chronicity of pain, fibromyalgia, and upcoming planned fusion  CLINICAL DECISION MAKING: {clinical decision making:25114}  EVALUATION COMPLEXITY: {Evaluation complexity:25115}   GOALS: Goals reviewed with patient? No  SHORT TERM GOALS: Target date: 02/23/2024  Pt will be independent with HEP in order to improve strength and decrease back pain to improve pain-free function at home and work. Baseline: 02/02/2024: Baseline HEP initiated Goal  status: INITIAL  LONG TERM GOALS: Target date: 03/15/2024  Pt will  Baseline:  Goal status: INITIAL  2.  Pt will decrease worst back pain by at least 2 points on the NPRS in order to demonstrate clinically significant reduction in back pain. Baseline: *** Goal status: INITIAL  3.  Pt will decrease mODI score by at least 13 points in order demonstrate clinically significant reduction in back pain/disability.       Baseline: *** Goal status: INITIAL  4.  *** Baseline: *** Goal status: INITIAL   PLAN: PT FREQUENCY: 1-2x/week  PT DURATION: 6 weeks  PLANNED INTERVENTIONS: Therapeutic exercises, Therapeutic activity, Neuromuscular re-education, Balance training, Gait training, Patient/Family education, Self Care, Joint mobilization, Joint manipulation, Vestibular training, Canalith repositioning, Orthotic/Fit training, DME instructions, Dry Needling, Electrical stimulation, Spinal manipulation, Spinal mobilization, Cryotherapy, Moist heat, Taping, Traction, Ultrasound, Ionotophoresis 4mg /ml Dexamethasone , Manual therapy, and Re-evaluation.  PLAN FOR NEXT SESSION: ***   Venetia Endo, PT, DPT [phone]  Venetia ONEIDA Endo, PT 02/02/2024, 8:23 AM

## 2024-02-03 ENCOUNTER — Telehealth: Payer: Self-pay | Admitting: Neurosurgery

## 2024-02-03 NOTE — Telephone Encounter (Signed)
 I notified the patient of Dr Elliot response and informed her to contact us  to see if we can move her up to a sooner date if PT discharges her early because they determine that it is not appropriate for her to continue.

## 2024-02-03 NOTE — Telephone Encounter (Signed)
 Patient is calling to see if there was a possibility of scheduling surgery any sooner than 03/21/2024.

## 2024-02-07 ENCOUNTER — Ambulatory Visit: Admitting: Physical Therapy

## 2024-02-07 DIAGNOSIS — R262 Difficulty in walking, not elsewhere classified: Secondary | ICD-10-CM

## 2024-02-07 DIAGNOSIS — M6281 Muscle weakness (generalized): Secondary | ICD-10-CM

## 2024-02-07 DIAGNOSIS — M545 Low back pain, unspecified: Secondary | ICD-10-CM

## 2024-02-07 DIAGNOSIS — G8929 Other chronic pain: Secondary | ICD-10-CM | POA: Diagnosis present

## 2024-02-07 DIAGNOSIS — M48062 Spinal stenosis, lumbar region with neurogenic claudication: Secondary | ICD-10-CM | POA: Diagnosis present

## 2024-02-07 DIAGNOSIS — M5459 Other low back pain: Secondary | ICD-10-CM | POA: Diagnosis present

## 2024-02-07 NOTE — Therapy (Signed)
 OUTPATIENT PHYSICAL THERAPY THORACOLUMBAR EVALUATION   Patient Name: Carrie Myers MRN: [id number] DOB:Jun 04, 1945, 78 y.o., female Today's Date: 02/07/2024  END OF SESSION:  PT End of Session - 02/07/24 1553     Visit Number 1    Number of Visits 13    Date for Recertification  03/22/24    PT Start Time 1603    PT Stop Time 1655    PT Time Calculation (min) 52 min    Activity Tolerance Patient tolerated treatment well    Behavior During Therapy [facility] for tasks assessed/performed          Past Medical History:  Diagnosis Date   Allergy    Anxiety    Arthritis    neck, hands, lower back   Bilateral dry eyes    Chronic low back pain 12/23/2016   Coronary artery disease    DDD (degenerative disc disease), lumbar 10/02/2014   Dental crowns present    Implants, front flipper while waiting for other implants   Emphysema of lung (HCC)    Fibromyalgia    Fibromyalgia    Fracture of right foot 01/14/2015   GERD (gastroesophageal reflux disease)    History of Graves' disease    Hyperlipidemia    Hypertension    Hypothyroidism    Myocardial infarction (HCC) 04/22/2006   Osteoarthritis    Other shoulder lesions, right shoulder 04/17/2015   Rheumatoid arthritis (HCC)    Sciatica, left side 01/01/2019   Sleep apnea    CPAP   Status post shoulder surgery 05/27/2015   Thyroid  disease    Vertigo    none for approx 9 yrs   Past Surgical History:  Procedure Laterality Date   ABDOMINAL HYSTERECTOMY     total   APPENDECTOMY     BREAST BIOPSY Right    neg   BREAST CYST ASPIRATION Left    neg   BREAST EXCISIONAL BIOPSY Right    BUNIONECTOMY Bilateral    CESAREAN SECTION     COLONOSCOPY  2010   normal   COLONOSCOPY WITH PROPOFOL  N/A 04/03/2018   Procedure: COLONOSCOPY WITH BIOPSIES;  Surgeon: Jinny Carmine, MD;  Location: [facility];  Service: Endoscopy;  Laterality: N/A;  sleep apnea   CORONARY ANGIOPLASTY WITH STENT PLACEMENT  04/2006   dental implant      [age]   DILATION AND CURETTAGE OF UTERUS     epidural steroid injection  2018   ESOPHAGOGASTRODUODENOSCOPY N/A 10/03/2020   Procedure: ESOPHAGOGASTRODUODENOSCOPY (EGD);  Surgeon: Maryruth Ole DASEN, MD;  Location: [facility];  Service: Endoscopy;  Laterality: N/A;  REQUESTING MORNING AFTER 9 AM   ESOPHAGOGASTRODUODENOSCOPY (EGD) WITH PROPOFOL  N/A 08/17/2022   Procedure: ESOPHAGOGASTRODUODENOSCOPY (EGD) WITH PROPOFOL ;  Surgeon: Maryruth Ole DASEN, MD;  Location: [facility] END[facility]COPY;  Service: Endoscopy;  Laterality: N/A;   EYE SURGERY     bilateral cataract extraction   SHOULDER ARTHROSCOPY Right 05/07/2015   Procedure: Extensive arthroscopic debridement and arthroscopic subacromial decompression, right shoulder.                        2.  Steroid injection right thumb CMC joint.;  Surgeon: Norleen JINNY Maltos, MD;  Location: [facility];  Service: Orthopedics;  Laterality: Right;   TOOTH EXTRACTION     Upper centrals and lateral   Patient Active Problem List   Diagnosis Date Noted   Right foot pain 11/01/2023   Arthralgia of left ankle 10/20/2021   Osteopenia determined by x-ray 03/31/2021  Primary osteoarthritis of left knee 01/22/2021   Effusion, right knee 12/15/2020   Primary osteoarthritis of right knee 11/24/2020   Overactive bladder 06/18/2020   Prediabetes 04/14/2020   Slow transit constipation 06/05/2019   Sciatica, left side 01/01/2019   Diverticulosis of colon 04/25/2018   Change in bowel habits    Tobacco use disorder, moderate, in sustained remission 12/22/2017   Moderate episode of recurrent major depressive disorder (HCC) 10/03/2017   Inflammatory spondylopathy of lumbar region 10/03/2017   Hoarseness, persistent 04/18/2017   OSA on CPAP 02/18/2017   Cervical disc disease 12/31/2016   Generalized osteoarthritis 12/23/2016   Chronic pain syndrome 12/23/2016   Cystocele, midline 12/06/2016   Urinary incontinence 12/06/2016   Impingement syndrome of right  shoulder 04/17/2015   Fibrocystic breast 01/18/2015   Gastroesophageal reflux disease without esophagitis 01/18/2015   Hypothyroidism, postablative 01/18/2015   Arteriosclerosis of coronary artery 12/29/2014   DJD of shoulder 11/13/2014   Fibromyalgia 10/02/2014   Intercostal neuralgia 10/02/2014   Facet syndrome, lumbar 10/02/2014   Sacroiliac joint disease 10/02/2014   Carotid artery narrowing 07/09/2014   Essential hypertension 07/01/2014   Osteoarthritis of thumb 09/24/2012   Mixed hyperlipidemia 09/22/2012   Arthritis of hand, degenerative 09/22/2012    PCP: Justus Leita DEL, MD  REFERRING PROVIDER: Clois Fret, MD  REFERRING DIAG: M43.16 (ICD-10-CM) - Spondylolisthesis of lumbar region   RATIONALE FOR EVALUATION AND TREATMENT: Rehabilitation  THERAPY DIAG: Chronic bilateral low back pain, unspecified whether sciatica present  Difficulty in walking, not elsewhere classified  Muscle weakness (generalized)  Spinal stenosis of lumbar region with neurogenic claudication  ONSET DATE: Chronic (10-year history of sciatica episodes with recurrent ESI; ESI in Sept 2025 did not alleviate pain, ongoing worsening symptoms)  FOLLOW-UP APPT SCHEDULED WITH REFERRING PROVIDER: Yes ; f/u with neurosurgery in Jan 2026 for lumbar fusion   SUBJECTIVE:                                                                                                                                                                                         SUBJECTIVE STATEMENT:  Pt is a 78 year old female with hx of chronic back pain/lumbar spinal stenosis.   PERTINENT HISTORY: Pt is a 78 year old female with hx of chronic back pain/lumbar spinal stenosis. Hx of fibromyalgia.    She now has lumbar fusion planned for 03/21/2024. She has experienced significant back pain for the past ten years, initially affecting her left sciatic nerve. Pain management injections were effective until September, but the  pain has worsened over the past two months. The pain is centered in her lower back, radiating down both legs and stopping just  above ankle, and is exacerbated by walking or standing for prolonged periods. She uses a walking stick for support and wears a brace when active.   Pt has known grade 1 anterolisthesis at L4-5. MRI shows facet osteoarthritis focally advanced at L4-5. Focally advanced disc degeneration at L5-S1 with progression from 2011. Mild foraminal narrowing at this level.  Coughing and sneezing exacerbate symptoms. Patient reports pain with urinating and having bowel movement due to back pain.   PAIN:    Pain Intensity: Present: 4/10, Best: 4/10, Worst: 10/10 Pain location: Across back, lumbosacral level Pain Quality: burning, shooting Radiating: Yes; down LEs and stopping just short of ankles (L worse than R) Numbness/Tingling: No Focal Weakness: No Aggravating factors: standing, bending over, walking Relieving factors: on the move; sitting on recliner/reclined position; heating pad  24-hour pain behavior: significant pain in AM and getting out of bed  How long can you stand: 15-20 min standing History of prior back injury, pain, surgery, or therapy: Yes; No hx of back surgery; chronic back pain with repeated ESIs, surgery planned for January 2026  Imaging: Yes ;  MR Lumbar spine; 05/24/23 Facet osteoarthritis focally advanced at L4-5 where there is anterolisthesis and advanced spinal stenosis that is progressed from 2011. Focally advanced disc degeneration at L5-S1 with progression from 2011. Mild foraminal narrowing at this level.   Red flags: Negative for bowel/bladder changes, saddle paresthesia, personal history of cancer, h/o spinal tumors, h/o compression fx, h/o abdominal aneurysm, abdominal pain, chills/fever, night sweats, nausea, vomiting, unrelenting pain, first onset of insidious LBP <20 y/o  PRECAUTIONS: None  WEIGHT BEARING RESTRICTIONS: No  FALLS: Has  patient fallen in last 6 months? No  Living Environment Lives with: lives alone Lives in: House/apartment; one level inside home Stairs: 1 step into home from garage Has following equipment at home: rubber non-slip mat in shower  Prior level of function: Independent  Occupational demands: Retired  Presenter, Broadcasting: Cooking, walking exercise routine  Patient Goals: Improve pain and try to manage pain prior to surgery   OBJECTIVE:  Patient Surveys  Modified Oswestry: 22/50 = 44%  Cognition Patient is oriented to person, place, and time.  Recent memory is intact.  Remote memory is intact.  Attention span and concentration are intact.  Expressive speech is intact.  Patient's fund of knowledge is within normal limits for educational level.    Gross Musculoskeletal Assessment Tremor: None Bulk: Normal Tone: Normal No visible step-off along spinal column, no signs of scoliosis  GAIT: Distance walked: 40 fit Assistive device utilized: Walking stick, L side Level of assistance: SBA Comments: Guarded posture, L>R pelvic drop with dec trunk rotation, dec arm swing  Posture: Lumbar lordosis: Decreased Increased thoracic kyphosis Iliac crest height: Equal bilaterally Lumbar lateral shift: Negative  AROM AROM (Normal range in degrees) AROM  02/07/2024  Lumbar   Flexion (65) 75%  Extension (30) 75%*  Right lateral flexion (25) 75%*  Left lateral flexion (25) 75%*  Right rotation (30)   Left rotation (30)       Hip Right Left  Flexion (125)    Extension (15)    Abduction (40)    Adduction     Internal Rotation (45)    External Rotation (45)        Knee    Flexion (135)    Extension (0)        Ankle    Dorsiflexion (20)    Plantarflexion (50)    Inversion (35)    Eversion (15)    (* =  pain; Blank rows = not tested)  LE MMT: MMT (out of 5) Right 02/07/2024 Left 02/07/2024  Hip flexion 4 4-  Hip extension    Hip abduction 4* 4*  Hip adduction    Hip internal  rotation    Hip external rotation    Knee flexion 5   Knee extension 4+* 4*  Ankle dorsiflexion 4+ 4-  Ankle plantarflexion    Ankle inversion    Ankle eversion    (* = pain; Blank rows = not tested)  Sensation Grossly intact to light touch throughout bilateral LEs as determined by testing dermatomes L2-S2. Proprioception, stereognosis, and hot/cold testing deferred on this date.  Reflexes R/L Knee Jerk (L3/4): 2+/2+  Ankle Jerk (S1/2): 2+/2+  Clonus: Negative Hoffman's Negative  Muscle Length Hamstrings: R: Not examined L: Not examined Ely (quadriceps): R: Not examined L: Not examined Thomas (hip flexors): R: Not examined L: Not examined   Palpation Location Right Left         Lumbar paraspinals    Quadratus Lumborum    Gluteus Maximus    Gluteus Medius    Deep hip external rotators    PSIS    Fortin's Area (SIJ)    Greater Trochanter    (Blank rows = not tested) Graded on 0-4 scale (0 = no pain, 1 = pain, 2 = pain with wincing/grimacing/flinching, 3 = pain with withdrawal, 4 = unwilling to allow palpation)  Passive Accessory  Motion Deferred  Special Tests Lumbar Radiculopathy and Discogenic: Centralization and Peripheralization (SN 92, -LR 0.12): Not examined Slump (SN 83, -LR 0.32): R: Not examined L: Not examined SLR (SN 92, -LR 0.29): R: Not examined L:  Not examined Crossed SLR (SP 90): R: Not examined L: Not examined  Facet Joint: Extension-Rotation (SN 100, -LR 0.0): R: Not examined L: Not examined  Lumbar Foraminal Stenosis: Lumbar quadrant (SN 70): R: Not examined L: Not examined  Hip: FABER (SN 81): R: Not examined L: Not examined     TODAY'S TREATMENT: DATE: 02/07/2024    Self-care/Home management - PT education, discussion on activity modification, POC  Patient education on current condition, anatomy involved, prognosis, plan of care. Discussion on activity modification to prevent flare-up of condition, including breaking up prolonged  standing time, avoidance of excessive or repetitive extension postures, and benefit of flexion-based exercise.       PATIENT EDUCATION:  Education details: see above for patient education details Person educated: Patient Education method: Medical Illustrator Education comprehension: verbalized understanding   HOME EXERCISE PROGRAM:  Formal HEP to be initiated next visit   ASSESSMENT:  CLINICAL IMPRESSION: Patient is a 78 y.o. female who was seen today for physical therapy evaluation and treatment for chronic low back pain in setting of known spondylosis and lumbar spinal stenosis with low-grade anterolisthesis at L4-5; mild L5-S1 foraminal narrowing. Pt has fusion planned for March 21, 2024 (1 level, anterior lateral fusion). Patient's history is consistent with spinal stenosis, and pt is awaiting lumbar fusion in January; pt has PT referral to exhaust conservative measures prior to surgical intervention. Pt has significantly debilitated by shooting, severe pain with transferring and thoracolumbar AROM. Objective exam is not fully completed due to time constraints today. We will need to update special testing, further MMT, palpation, passive accessories, and test response with flexion. Pt will continue to benefit from skilled PT services to address deficits and improve function.   OBJECTIVE IMPAIRMENTS: Abnormal gait, difficulty walking, decreased ROM, decreased strength, impaired flexibility, postural dysfunction,  and pain.   ACTIVITY LIMITATIONS: carrying, lifting, bending, sitting, sleeping, transfers, and locomotion level  PARTICIPATION LIMITATIONS: meal prep, driving, shopping, community activity, and church  PERSONAL FACTORS: Age, Past/current experiences, Time since onset of injury/illness/exacerbation, and 3+ comorbidities: (anxiety, CAD, emphysema, fibromyalgia, Grave's disease, RA, OA, HLD, HTN) are also affecting patient's functional outcome.   REHAB POTENTIAL:  Fair due to significant comorbidities, chronicity of pain, fibromyalgia, and upcoming planned fusion  CLINICAL DECISION MAKING: Unstable/unpredictable  EVALUATION COMPLEXITY: High   GOALS: Goals reviewed with patient? No  SHORT TERM GOALS: Target date: 02/29/2024  Pt will be independent with HEP in order to improve strength and decrease back pain to improve pain-free function at home and work. Baseline: 02/08/2024: Baseline HEP initiated Goal status: INITIAL   LONG TERM GOALS: Target date: 03/21/2024  Patient will have full thoracolumbar AROM without reproduction of pain as needed for reaching items on ground, household chores, bending. Baseline: 02/08/24: Decreased flexion and extension ROM, pain with extension/concordant sign. Goal status: INITIAL  2.  Pt will decrease worst back pain by at least 2 points on the NPRS in order to demonstrate clinically significant reduction in back pain. Baseline: 111/26/25: 10/10 at worst Goal status: INITIAL  3.  Pt will decrease mODI score by at least 10 points in order demonstrate clinically significant reduction in back pain/disability.       Baseline: 02/08/24:  22/50 = 44% Goal status: INITIAL  4.  Pt will be able to complete community-level ambulation with LRAD without exacerbation of back pain as needed for accessing local community and church/philanthropic events Baseline: 02/08/24: Severe pain with prolonged walking and standing Goal status: INITIAL   PLAN: PT FREQUENCY: 1-2x/week  PT DURATION: 6 weeks  PLANNED INTERVENTIONS: Therapeutic exercises, Therapeutic activity, Neuromuscular re-education, Balance training, Gait training, Patient/Family education, Self Care, Joint mobilization, Joint manipulation, Vestibular training, Canalith repositioning, Orthotic/Fit training, DME instructions, Dry Needling, Electrical stimulation, Spinal manipulation, Spinal mobilization, Cryotherapy, Moist heat, Taping, Traction, Ultrasound,  Ionotophoresis 4mg /ml Dexamethasone , Manual therapy, and Re-evaluation.  PLAN FOR NEXT SESSION: SLUMP, SLR testing, lumbar quadrant and extension-rotation testing. Palpation and passive accessories. Test response with traction and flexion in unloaded position. Manual techniques prn for symptom modulation. Consider neurodynamics following lower limb tension testing.    Venetia Endo, PT, DPT #[id number]  Venetia ONEIDA Endo, PT 02/08/2024, 2:46 PM

## 2024-02-08 ENCOUNTER — Encounter: Payer: Self-pay | Admitting: Physical Therapy

## 2024-02-13 ENCOUNTER — Encounter: Payer: Self-pay | Admitting: Physical Therapy

## 2024-02-13 ENCOUNTER — Ambulatory Visit: Admitting: Physical Therapy

## 2024-02-13 DIAGNOSIS — G8929 Other chronic pain: Secondary | ICD-10-CM | POA: Diagnosis present

## 2024-02-13 DIAGNOSIS — M48062 Spinal stenosis, lumbar region with neurogenic claudication: Secondary | ICD-10-CM | POA: Insufficient documentation

## 2024-02-13 DIAGNOSIS — M545 Low back pain, unspecified: Secondary | ICD-10-CM | POA: Insufficient documentation

## 2024-02-13 DIAGNOSIS — M6281 Muscle weakness (generalized): Secondary | ICD-10-CM | POA: Insufficient documentation

## 2024-02-13 DIAGNOSIS — R262 Difficulty in walking, not elsewhere classified: Secondary | ICD-10-CM | POA: Diagnosis present

## 2024-02-13 NOTE — Therapy (Unsigned)
 OUTPATIENT PHYSICAL THERAPY THORACOLUMBAR/LOWER QUARTER TREATMENT   Patient Name: Carrie Myers MRN: [id number] DOB:1946-03-07, 78 y.o., female Today's Date: 02/13/2024  END OF SESSION:  PT End of Session - 02/13/24 1416     Visit Number 2    Number of Visits 13    Date for Recertification  03/22/24    PT Start Time 1417    PT Stop Time 1507    PT Time Calculation (min) 50 min    Activity Tolerance Patient tolerated treatment well    Behavior During Therapy [facility] for tasks assessed/performed           Past Medical History:  Diagnosis Date   Allergy    Anxiety    Arthritis    neck, hands, lower back   Bilateral dry eyes    Chronic low back pain 12/23/2016   Coronary artery disease    DDD (degenerative disc disease), lumbar 10/02/2014   Dental crowns present    Implants, front flipper while waiting for other implants   Emphysema of lung (HCC)    Fibromyalgia    Fibromyalgia    Fracture of right foot 01/14/2015   GERD (gastroesophageal reflux disease)    History of Graves' disease    Hyperlipidemia    Hypertension    Hypothyroidism    Myocardial infarction (HCC) 04/22/2006   Osteoarthritis    Other shoulder lesions, right shoulder 04/17/2015   Rheumatoid arthritis (HCC)    Sciatica, left side 01/01/2019   Sleep apnea    CPAP   Status post shoulder surgery 05/27/2015   Thyroid  disease    Vertigo    none for approx 9 yrs   Past Surgical History:  Procedure Laterality Date   ABDOMINAL HYSTERECTOMY     total   APPENDECTOMY     BREAST BIOPSY Right    neg   BREAST CYST ASPIRATION Left    neg   BREAST EXCISIONAL BIOPSY Right    BUNIONECTOMY Bilateral    CESAREAN SECTION     COLONOSCOPY  2010   normal   COLONOSCOPY WITH PROPOFOL  N/A 04/03/2018   Procedure: COLONOSCOPY WITH BIOPSIES;  Surgeon: Jinny Carmine, MD;  Location: [facility];  Service: Endoscopy;  Laterality: N/A;  sleep apnea   CORONARY ANGIOPLASTY WITH STENT PLACEMENT  04/2006    dental implant     [age]   DILATION AND CURETTAGE OF UTERUS     epidural steroid injection  2018   ESOPHAGOGASTRODUODENOSCOPY N/A 10/03/2020   Procedure: ESOPHAGOGASTRODUODENOSCOPY (EGD);  Surgeon: Maryruth Ole DASEN, MD;  Location: [facility];  Service: Endoscopy;  Laterality: N/A;  REQUESTING MORNING AFTER 9 AM   ESOPHAGOGASTRODUODENOSCOPY (EGD) WITH PROPOFOL  N/A 08/17/2022   Procedure: ESOPHAGOGASTRODUODENOSCOPY (EGD) WITH PROPOFOL ;  Surgeon: Maryruth Ole DASEN, MD;  Location: [facility] ENDOSCOPY;  Service: Endoscopy;  Laterality: N/A;   EYE SURGERY     bilateral cataract extraction   SHOULDER ARTHROSCOPY Right 05/07/2015   Procedure: Extensive arthroscopic debridement and arthroscopic subacromial decompression, right shoulder.                        2.  Steroid injection right thumb CMC joint.;  Surgeon: Norleen JINNY Maltos, MD;  Location: [facility];  Service: Orthopedics;  Laterality: Right;   TOOTH EXTRACTION     Upper centrals and lateral   Patient Active Problem List   Diagnosis Date Noted   Right foot pain 11/01/2023   Arthralgia of left ankle 10/20/2021   Osteopenia determined by  x-ray 03/31/2021   Primary osteoarthritis of left knee 01/22/2021   Effusion, right knee 12/15/2020   Primary osteoarthritis of right knee 11/24/2020   Overactive bladder 06/18/2020   Prediabetes 04/14/2020   Slow transit constipation 06/05/2019   Sciatica, left side 01/01/2019   Diverticulosis of colon 04/25/2018   Change in bowel habits    Tobacco use disorder, moderate, in sustained remission 12/22/2017   Moderate episode of recurrent major depressive disorder (HCC) 10/03/2017   Inflammatory spondylopathy of lumbar region 10/03/2017   Hoarseness, persistent 04/18/2017   OSA on CPAP 02/18/2017   Cervical disc disease 12/31/2016   Generalized osteoarthritis 12/23/2016   Chronic pain syndrome 12/23/2016   Cystocele, midline 12/06/2016   Urinary incontinence 12/06/2016   Impingement syndrome  of right shoulder 04/17/2015   Fibrocystic breast 01/18/2015   Gastroesophageal reflux disease without esophagitis 01/18/2015   Hypothyroidism, postablative 01/18/2015   Arteriosclerosis of coronary artery 12/29/2014   DJD of shoulder 11/13/2014   Fibromyalgia 10/02/2014   Intercostal neuralgia 10/02/2014   Facet syndrome, lumbar 10/02/2014   Sacroiliac joint disease 10/02/2014   Carotid artery narrowing 07/09/2014   Essential hypertension 07/01/2014   Osteoarthritis of thumb 09/24/2012   Mixed hyperlipidemia 09/22/2012   Arthritis of hand, degenerative 09/22/2012    PCP: Justus Leita DEL, MD  REFERRING PROVIDER: Clois Fret, MD  REFERRING DIAG: M43.16 (ICD-10-CM) - Spondylolisthesis of lumbar region   RATIONALE FOR EVALUATION AND TREATMENT: Rehabilitation  THERAPY DIAG: Chronic bilateral low back pain, unspecified whether sciatica present  Difficulty in walking, not elsewhere classified  Muscle weakness (generalized)  Spinal stenosis of lumbar region with neurogenic claudication  ONSET DATE: Chronic (10-year history of sciatica episodes with recurrent ESI; ESI in Sept 2025 did not alleviate pain, ongoing worsening symptoms)  FOLLOW-UP APPT SCHEDULED WITH REFERRING PROVIDER: Yes ; f/u with neurosurgery in Jan 2026 for lumbar fusion  PERTINENT HISTORY: Pt is a 78 year old female with hx of chronic back pain/lumbar spinal stenosis. Hx of fibromyalgia.    She now has lumbar fusion planned for 03/21/2024. She has experienced significant back pain for the past ten years, initially affecting her left sciatic nerve. Pain management injections were effective until September, but the pain has worsened over the past two months. The pain is centered in her lower back, radiating down both legs and stopping just above ankle, and is exacerbated by walking or standing for prolonged periods. She uses a walking stick for support and wears a brace when active.   Pt has known grade 1  anterolisthesis at L4-5. MRI shows facet osteoarthritis focally advanced at L4-5. Focally advanced disc degeneration at L5-S1 with progression from 2011. Mild foraminal narrowing at this level.  Coughing and sneezing exacerbate symptoms. Patient reports pain with urinating and having bowel movement due to back pain.   PAIN:    Pain Intensity: Present: 4/10, Best: 4/10, Worst: 10/10 Pain location: Across back, lumbosacral level Pain Quality: burning, shooting Radiating: Yes; down LEs and stopping just short of ankles (L worse than R) Numbness/Tingling: No Focal Weakness: No Aggravating factors: standing, bending over, walking Relieving factors: on the move; sitting on recliner/reclined position; heating pad  24-hour pain behavior: significant pain in AM and getting out of bed  How long can you stand: 15-20 min standing History of prior back injury, pain, surgery, or therapy: Yes; No hx of back surgery; chronic back pain with repeated ESIs, surgery planned for January 2026  Imaging: Yes ;  MR Lumbar spine; 05/24/23 Facet osteoarthritis focally advanced at  L4-5 where there is anterolisthesis and advanced spinal stenosis that is progressed from 2011. Focally advanced disc degeneration at L5-S1 with progression from 2011. Mild foraminal narrowing at this level.   Red flags: Negative for bowel/bladder changes, saddle paresthesia, personal history of cancer, h/o spinal tumors, h/o compression fx, h/o abdominal aneurysm, abdominal pain, chills/fever, night sweats, nausea, vomiting, unrelenting pain, first onset of insidious LBP <20 y/o  PRECAUTIONS: None  WEIGHT BEARING RESTRICTIONS: No  FALLS: Has patient fallen in last 6 months? No  Living Environment Lives with: lives alone Lives in: House/apartment; one level inside home Stairs: 1 step into home from garage Has following equipment at home: rubber non-slip mat in shower  Prior level of function: Independent  Occupational  demands: Retired  Presenter, Broadcasting: Cooking, walking exercise routine  Patient Goals: Improve pain and try to manage pain prior to surgery    OBJECTIVE (data from initial evaluation unless otherwise dated):   Patient Surveys  Modified Oswestry: 22/50 = 44%  Cognition Patient is oriented to person, place, and time.  Recent memory is intact.  Remote memory is intact.  Attention span and concentration are intact.  Expressive speech is intact.  Patient's fund of knowledge is within normal limits for educational level.    Gross Musculoskeletal Assessment Tremor: None Bulk: Normal Tone: Normal No visible step-off along spinal column, no signs of scoliosis   GAIT: Distance walked: 40 fit Assistive device utilized: Walking stick, L side Level of assistance: SBA Comments: Guarded posture, L>R pelvic drop with dec trunk rotation, dec arm swing  Posture: Lumbar lordosis: Decreased Increased thoracic kyphosis Iliac crest height: Equal bilaterally Lumbar lateral shift: Negative  AROM AROM (Normal range in degrees) AROM  02/07/2024  Lumbar   Flexion (65) 75%*  Extension (30) 75%*  Right lateral flexion (25) 75%*  Left lateral flexion (25) 75%*  Right rotation (30) WNL  Left rotation (30) 75%*       Hip Right Left  Flexion (125) WNL WNL  Extension (15)    Abduction (40)    Adduction     Internal Rotation (45)    External Rotation (45) WNL WNL      Knee    Flexion (135)    Extension (0)        Ankle    Dorsiflexion (20)    Plantarflexion (50)    Inversion (35)    Eversion (15)    (* = pain; Blank rows = not tested)  LE MMT: MMT (out of 5) Right 02/07/2024 Left 02/07/2024  Hip flexion 4 4-  Hip extension    Hip abduction 4* 4*  Hip adduction    Hip internal rotation    Hip external rotation    Knee flexion 5   Knee extension 4+* 4*  Ankle dorsiflexion 4+ 4-  Ankle plantarflexion    Ankle inversion    Ankle eversion    (* = pain; Blank rows = not  tested)  Sensation Grossly intact to light touch throughout bilateral LEs as determined by testing dermatomes L2-S2. Proprioception, stereognosis, and hot/cold testing deferred on this date.  Reflexes R/L Knee Jerk (L3/4): 2+/2+  Ankle Jerk (S1/2): 2+/2+  Clonus: Negative Hoffman's Negative  Muscle Length Hamstrings: R: Only 10 deg deficit; L: Only 10 deg deficit  Ely (quadriceps): R: Not examined L: Not examined Thomas (hip flexors): R: Not examined L: Not examined   Palpation Location Right Left         Lumbar paraspinals  Quadratus Lumborum    Gluteus Maximus    Gluteus Medius    Deep hip external rotators    PSIS    Fortin's Area (SIJ)    Greater Trochanter    (Blank rows = not tested) Graded on 0-4 scale (0 = no pain, 1 = pain, 2 = pain with wincing/grimacing/flinching, 3 = pain with withdrawal, 4 = unwilling to allow palpation)  Passive Accessory  Motion Deferred  Special Tests (02/13/24) Lumbar Radiculopathy and Discogenic: Centralization and Peripheralization (SN 92, -LR 0.12):  -Repeated flexion in lying: no effect during, moderate gluteal pain R>L immediately afterward; (minimal pain in lying after rest break) Slump (SN 83, -LR 0.32): R: Negative  L: Positive   SLR (SN 92, -LR 0.29): R: Negative   L: Positive   General lumbar traction in hooklying: No effect  Facet Joint: Extension-Rotation (SN 100, -LR 0.0): R: Positive   L: Positive    Lumbar Foraminal Stenosis: Lumbar quadrant (SN 70): R: Negative   L: Negative    Hip: FABER (SN 81): R: Positive for back pain   L: Positive for back pain       TODAY'S TREATMENT: DATE: 02/13/2024   SUBJECTIVE STATEMENT:   Patient reports severe pain with transferring to seat. Patient reports feeling similar level of substantial pain felt last week. She feels that today is generally worse day than last visit. Patient 7-8/10 at arrival. Pt reports pain radiating down length of LLE.    Completed testing for  completion of initial assessment (special testing, repeated motion testing, re-check of AROM baselines)   Therapeutic Exercise - for improved soft tissue flexibility and extensibility as needed for ROM, improved strength as needed to improve performance of CKC activities/functional movements  Repeated flexion in lying: 1 x 10  -no pain in lying after  Seated sciatic nerve flossing; 1 x 10, for HEP review  PATIENT EDUCATION: Discussed strategies for symptom modulation. HEP initiated and new MedBridge. Reviewed log rolling technique to improve tolerance of transferring from bed.     MHP (unbilled) utilized post-treatment for analgesic effect and improved soft tissue extensibility, x 5 minutes on low back with pt seated    PATIENT EDUCATION:  Education details: see above for patient education details Person educated: Patient Education method: Medical Illustrator Education comprehension: verbalized understanding   HOME EXERCISE PROGRAM:  Access Code: [email or web address] URL: [email or web address] Date: 02/13/2024 Prepared by: Venetia Endo  Exercises - Supine Double Knee to Chest  - 5-6 x daily - 7 x weekly - 1 sets - 10 reps - 1sec hold - Seated Slump Nerve Glide  - 2 x daily - 7 x weekly - 2 sets - 10 reps - 1sec hold   ASSESSMENT:  CLINICAL IMPRESSION: Patient has severe symptoms and notable pain responses with transferring and bed mobility tasks today. We reviewed log rolling technique to improve tolerance with supine to sit transfers; pt has significant pain moving from sidelying to upright sitting. She has poor baseline tolerance of standing flexion, and she has positive testing for radiculopathy/lower limb neural tension and lumbar spinal stenosis. We initiated her HEP with focus on supine flexion; we also reviewed seated nerve flossing to improve neural/dural tension and lower limb radiating symptoms. Most of today's session focused on completion of necessary  testing; volume of exercise is limited due to time required for testing given heightened pain response and limited activity tolerance. Pt will continue to benefit from skilled PT services to address deficits and improve function.  OBJECTIVE IMPAIRMENTS: Abnormal gait, difficulty walking, decreased ROM, decreased strength, impaired flexibility, postural dysfunction, and pain.   ACTIVITY LIMITATIONS: carrying, lifting, bending, sitting, sleeping, transfers, and locomotion level  PARTICIPATION LIMITATIONS: meal prep, driving, shopping, community activity, and church  PERSONAL FACTORS: Age, Past/current experiences, Time since onset of injury/illness/exacerbation, and 3+ comorbidities: (anxiety, CAD, emphysema, fibromyalgia, Grave's disease, RA, OA, HLD, HTN) are also affecting patient's functional outcome.   REHAB POTENTIAL: Fair due to significant comorbidities, chronicity of pain, fibromyalgia, and upcoming planned fusion  CLINICAL DECISION MAKING: Unstable/unpredictable  EVALUATION COMPLEXITY: High   GOALS: Goals reviewed with patient? No  SHORT TERM GOALS: Target date: 02/29/2024  Pt will be independent with HEP in order to improve strength and decrease back pain to improve pain-free function at home and work. Baseline: 02/08/2024: Baseline HEP initiated Goal status: INITIAL   LONG TERM GOALS: Target date: 03/21/2024  Patient will have full thoracolumbar AROM without reproduction of pain as needed for reaching items on ground, household chores, bending. Baseline: 02/08/24: Decreased flexion and extension ROM, pain with extension/concordant sign. Goal status: INITIAL  2.  Pt will decrease worst back pain by at least 2 points on the NPRS in order to demonstrate clinically significant reduction in back pain. Baseline: 111/26/25: 10/10 at worst Goal status: INITIAL  3.  Pt will decrease mODI score by at least 10 points in order demonstrate clinically significant reduction in back  pain/disability.       Baseline: 02/08/24:  22/50 = 44% Goal status: INITIAL  4.  Pt will be able to complete community-level ambulation with LRAD without exacerbation of back pain as needed for accessing local community and church/philanthropic events Baseline: 02/08/24: Severe pain with prolonged walking and standing Goal status: INITIAL   PLAN: PT FREQUENCY: 1-2x/week  PT DURATION: 6 weeks  PLANNED INTERVENTIONS: Therapeutic exercises, Therapeutic activity, Neuromuscular re-education, Balance training, Gait training, Patient/Family education, Self Care, Joint mobilization, Joint manipulation, Vestibular training, Canalith repositioning, Orthotic/Fit training, DME instructions, Dry Needling, Electrical stimulation, Spinal manipulation, Spinal mobilization, Cryotherapy, Moist heat, Taping, Traction, Ultrasound, Ionotophoresis 4mg /ml Dexamethasone , Manual therapy, and Re-evaluation.  PLAN FOR NEXT SESSION: Palpation and passive accessory testing. Follow-up on response with repeated flexion. Manual techniques prn for symptom modulation and consideration of modalities (e-stim, TENS) for severe pain.    Venetia Endo, PT, DPT #[id number]  Venetia ONEIDA Endo, PT 02/14/2024, 11:15 AM

## 2024-02-14 NOTE — Progress Notes (Unsigned)
 Justus Leita DEL, MD   No chief complaint on file.   HPI:      [name] is a 78 y.o. G1P1001 whose LMP was No LMP recorded. Patient has had a hysterectomy., presents today for yearly LS f/u.   7/24 NOTE: Patient has had a hysterectomy., presents today for LS f/u from 2/24. Pt with vaginal itching, LS diagnosed on bx. Pt on clobetasol  at bedtime for 4 wks, was then supposed to have f/u appt but didn't remember to do that. Sx improved, stopped clobetasol . Now treats daily for a few days when sx flare.     Patient Active Problem List   Diagnosis Date Noted   Right foot pain 11/01/2023   Arthralgia of left ankle 10/20/2021   Osteopenia determined by x-ray 03/31/2021   Primary osteoarthritis of left knee 01/22/2021   Effusion, right knee 12/15/2020   Primary osteoarthritis of right knee 11/24/2020   Overactive bladder 06/18/2020   Prediabetes 04/14/2020   Slow transit constipation 06/05/2019   Sciatica, left side 01/01/2019   Diverticulosis of colon 04/25/2018   Change in bowel habits    Tobacco use disorder, moderate, in sustained remission 12/22/2017   Moderate episode of recurrent major depressive disorder (HCC) 10/03/2017   Inflammatory spondylopathy of lumbar region 10/03/2017   Hoarseness, persistent 04/18/2017   OSA on CPAP 02/18/2017   Cervical disc disease 12/31/2016   Generalized osteoarthritis 12/23/2016   Chronic pain syndrome 12/23/2016   Cystocele, midline 12/06/2016   Urinary incontinence 12/06/2016   Impingement syndrome of right shoulder 04/17/2015   Fibrocystic breast 01/18/2015   Gastroesophageal reflux disease without esophagitis 01/18/2015   Hypothyroidism, postablative 01/18/2015   Arteriosclerosis of coronary artery 12/29/2014   DJD of shoulder 11/13/2014   Fibromyalgia 10/02/2014   Intercostal neuralgia 10/02/2014   Facet syndrome, lumbar 10/02/2014   Sacroiliac joint disease 10/02/2014   Carotid artery narrowing 07/09/2014    Essential hypertension 07/01/2014   Osteoarthritis of thumb 09/24/2012   Mixed hyperlipidemia 09/22/2012   Arthritis of hand, degenerative 09/22/2012    Past Surgical History:  Procedure Laterality Date   ABDOMINAL HYSTERECTOMY     total   APPENDECTOMY     BREAST BIOPSY Right    neg   BREAST CYST ASPIRATION Left    neg   BREAST EXCISIONAL BIOPSY Right    BUNIONECTOMY Bilateral    CESAREAN SECTION     COLONOSCOPY  2010   normal   COLONOSCOPY WITH PROPOFOL  N/A 04/03/2018   Procedure: COLONOSCOPY WITH BIOPSIES;  Surgeon: Jinny Carmine, MD;  Location: [facility];  Service: Endoscopy;  Laterality: N/A;  sleep apnea   CORONARY ANGIOPLASTY WITH STENT PLACEMENT  04/2006   dental implant     [age]   DILATION AND CURETTAGE OF UTERUS     epidural steroid injection  2018   ESOPHAGOGASTRODUODENOSCOPY N/A 10/03/2020   Procedure: ESOPHAGOGASTRODUODENOSCOPY (EGD);  Surgeon: Maryruth Ole DASEN, MD;  Location: [facility];  Service: Endoscopy;  Laterality: N/A;  REQUESTING MORNING AFTER 9 AM   ESOPHAGOGASTRODUODENOSCOPY (EGD) WITH PROPOFOL  N/A 08/17/2022   Procedure: ESOPHAGOGASTRODUODENOSCOPY (EGD) WITH PROPOFOL ;  Surgeon: Maryruth Ole DASEN, MD;  Location: [facility] END[facility];  Service: Endoscopy;  Laterality: N/A;   EYE SURGERY     bilateral cataract extraction   SHOULDER ARTHROSCOPY Right 05/07/2015   Procedure: Extensive arthroscopic debridement and arthroscopic subacromial decompression, right shoulder.  2.  Steroid injection right thumb CMC joint.;  Surgeon: Norleen JINNY Maltos, MD;  Location: [facility];  Service: Orthopedics;  Laterality: Right;   TOOTH EXTRACTION     Upper centrals and lateral    Family History  Problem Relation Age of Onset   Stroke Mother    Heart disease Father    Pancreatic cancer Sister    Diabetes Sister    Prostate cancer Brother    Breast cancer Neg Hx    Kidney cancer Neg Hx    Bladder Cancer Neg Hx     Social History    Socioeconomic History   Marital status: Widowed    Spouse name: Not on file   Number of children: 1   Years of education: 77   Highest education level: High school graduate  Occupational History   Occupation: retired   Tobacco Use   Smoking status: Former    Current packs/day: 0.00    Average packs/day: 1.3 packs/day for 44.0 years (55.0 ttl pk-yrs)    Types: Cigarettes    Start date: 04/22/1962    Quit date: 04/22/2006    Years since quitting: 17.8    Passive exposure: Never   Smokeless tobacco: Never   Tobacco comments:       Vaping Use   Vaping status: Never Used  Substance and Sexual Activity   Alcohol use: Not Currently   Drug use: Never   Sexual activity: Not Currently    Partners: Male    Birth control/protection: None  Other Topics Concern   Not on file  Social History Narrative   Pt lives alone    Social Drivers of Health   Financial Resource Strain: Low Risk  (07/20/2023)   Received from [facility]   Overall Financial Resource Strain (CARDIA)    Difficulty of Paying Living Expenses: Not hard at all  Food Insecurity: No Food Insecurity (07/20/2023)   Received from [facility]   Hunger Vital Sign    Within the past 12 months, you worried that your food would run out before you got the money to buy more.: Never true    Within the past 12 months, the food you bought just didn't last and you didn't have money to get more.: Never true  Transportation Needs: No Transportation Needs (07/20/2023)   Received from [facility] - Transportation    In the past 12 months, has lack of transportation kept you from medical appointments or from getting medications?: No    Lack of Transportation (Non-Medical): No  Physical Activity: Insufficiently Active (07/14/2023)   Exercise Vital Sign    Days of Exercise per Week: 5 days    Minutes of Exercise per Session: 20 min  Stress: Stress Concern Present (07/14/2023)    [employer] - Occupational Stress Questionnaire    Feeling of Stress : To some extent  Social Connections: Socially Isolated (07/14/2023)   Social Connection and Isolation Panel    Frequency of Communication with Friends and Family: More than three times a week    Frequency of Social Gatherings with Friends and Family: Three times a week    Attends Religious Services: Never    Active Member of Clubs or Organizations: No    Attends Banker Meetings: Never    Marital Status: Widowed  Intimate Partner Violence: Not At Risk (07/14/2023)   Humiliation, Afraid, Rape, and Kick questionnaire    Fear of Current  or Ex-Partner: No    Emotionally Abused: No    Physically Abused: No    Sexually Abused: No    Outpatient Medications Prior to Visit  Medication Sig Dispense Refill   albuterol  (VENTOLIN  HFA) 108 (90 Base) MCG/ACT inhaler Inhale into the lungs. (Patient taking differently: Inhale into the lungs.)     Armodafinil 150 MG tablet Take 150 mg by mouth daily. For fibromyalgia fatigue (Patient taking differently: Take 150 mg by mouth daily. For fibromyalgia fatigue)     aspirin EC 81 MG tablet Take 81 mg by mouth daily.     cetirizine (ZYRTEC) 10 MG tablet Take 10 mg by mouth daily.     diazepam  (VALIUM ) 5 MG tablet Take 1 tablet (5 mg total) by mouth at bedtime as needed for anxiety. 30 tablet 2   Diclofenac -miSOPROStol  75-0.2 MG TBEC TAKE (1) TABLET BY MOUTH TWICE DAILY 60 tablet 1   diphenhydramine-acetaminophen  (TYLENOL  PM) 25-500 MG TABS tablet Take 1 tablet by mouth at bedtime as needed.     DULoxetine  (CYMBALTA ) 30 MG capsule      HYDROcodone -acetaminophen  (NORCO/VICODIN) 5-325 MG tablet Take 1 tablet by mouth 2 (two) times daily as needed for moderate pain. 45 tablet 0   HYDROcodone -acetaminophen  (NORCO/VICODIN) 5-325 MG tablet Take 1 tablet by mouth every 6 (six) hours as needed for moderate pain (pain score 4-6) or severe pain (pain score 7-10). 30  tablet 0   levothyroxine (SYNTHROID) 88 MCG tablet TAKE 1 TABLET BY MOUTH DAILY 90 tablet 3   losartan (COZAAR) 100 MG tablet Take 100 mg by mouth daily.     Multiple Vitamin (MULTI-VITAMINS) TABS Take by mouth.     NON FORMULARY at bedtime. CPAP @ 11 cm H2O with Oxygen      pravastatin (PRAVACHOL) 80 MG tablet Take 80 mg by mouth daily.     No facility-administered medications prior to visit.      ROS:  Review of Systems BREAST: No symptoms   OBJECTIVE:   Vitals:  There were no vitals taken for this visit.  Physical Exam  Results: No results found for this or any previous visit (from the past 24 hours).   Assessment/Plan: No diagnosis found.    No orders of the defined types were placed in this encounter.     No follow-ups on file.  Adric Wrede B. Andrey Mccaskill, PA-C 02/14/2024 5:30 PM

## 2024-02-15 ENCOUNTER — Ambulatory Visit: Admitting: Obstetrics and Gynecology

## 2024-02-15 ENCOUNTER — Encounter: Payer: Self-pay | Admitting: Obstetrics and Gynecology

## 2024-02-15 ENCOUNTER — Ambulatory Visit: Admitting: Physical Therapy

## 2024-02-15 VITALS — BP 113/67 | HR 101 | Ht 68.0 in | Wt 182.0 lb

## 2024-02-15 DIAGNOSIS — N9069 Other specified hypertrophy of vulva: Secondary | ICD-10-CM | POA: Diagnosis not present

## 2024-02-15 DIAGNOSIS — L738 Other specified follicular disorders: Secondary | ICD-10-CM

## 2024-02-15 DIAGNOSIS — L900 Lichen sclerosus et atrophicus: Secondary | ICD-10-CM | POA: Diagnosis not present

## 2024-02-15 DIAGNOSIS — R262 Difficulty in walking, not elsewhere classified: Secondary | ICD-10-CM

## 2024-02-15 DIAGNOSIS — M6281 Muscle weakness (generalized): Secondary | ICD-10-CM

## 2024-02-15 DIAGNOSIS — M48062 Spinal stenosis, lumbar region with neurogenic claudication: Secondary | ICD-10-CM

## 2024-02-15 DIAGNOSIS — M545 Low back pain, unspecified: Secondary | ICD-10-CM | POA: Diagnosis not present

## 2024-02-15 DIAGNOSIS — G8929 Other chronic pain: Secondary | ICD-10-CM

## 2024-02-15 MED ORDER — CLOBETASOL PROPIONATE 0.05 % EX OINT: TOPICAL_OINTMENT | CUTANEOUS | 0 refills | Status: AC

## 2024-02-15 NOTE — Therapy (Addendum)
 OUTPATIENT PHYSICAL THERAPY THORACOLUMBAR/LOWER QUARTER TREATMENT   Patient Name: Carrie Myers MRN: [id number] DOB:09/18/1945, 78 y.o., female Today's Date: 02/15/2024   END OF SESSION:  PT End of Session - 02/15/24 1412     Visit Number 3    Number of Visits 13    Date for Recertification  03/22/24    PT Start Time 1413    PT Stop Time 1520    PT Time Calculation (min) 67 min    Activity Tolerance Patient tolerated treatment well    Behavior During Therapy [facility] for tasks assessed/performed          Past Medical History:  Diagnosis Date   Allergy    Anxiety    Arthritis    neck, hands, lower back   Bilateral dry eyes    Chronic low back pain 12/23/2016   Coronary artery disease    DDD (degenerative disc disease), lumbar 10/02/2014   Dental crowns present    Implants, front flipper while waiting for other implants   Emphysema of lung (HCC)    Fibromyalgia    Fibromyalgia    Fracture of right foot 01/14/2015   GERD (gastroesophageal reflux disease)    History of Graves' disease    Hyperlipidemia    Hypertension    Hypothyroidism    Myocardial infarction (HCC) 04/22/2006   Osteoarthritis    Other shoulder lesions, right shoulder 04/17/2015   Rheumatoid arthritis (HCC)    Sciatica, left side 01/01/2019   Sleep apnea    CPAP   Status post shoulder surgery 05/27/2015   Thyroid  disease    Vertigo    none for approx 9 yrs   Past Surgical History:  Procedure Laterality Date   ABDOMINAL HYSTERECTOMY     total   APPENDECTOMY     BREAST BIOPSY Right    neg   BREAST CYST ASPIRATION Left    neg   BREAST EXCISIONAL BIOPSY Right    BUNIONECTOMY Bilateral    CESAREAN SECTION     COLONOSCOPY  2010   normal   COLONOSCOPY WITH PROPOFOL  N/A 04/03/2018   Procedure: COLONOSCOPY WITH BIOPSIES;  Surgeon: Jinny Carmine, MD;  Location: [facility];  Service: Endoscopy;  Laterality: N/A;  sleep apnea   CORONARY ANGIOPLASTY WITH STENT PLACEMENT  04/2006    dental implant     [age]   DILATION AND CURETTAGE OF UTERUS     epidural steroid injection  2018   ESOPHAGOGASTRODUODENOSCOPY N/A 10/03/2020   Procedure: ESOPHAGOGASTRODUODENOSCOPY (EGD);  Surgeon: Maryruth Ole DASEN, MD;  Location: [facility];  Service: Endoscopy;  Laterality: N/A;  REQUESTING MORNING AFTER 9 AM   ESOPHAGOGASTRODUODENOSCOPY (EGD) WITH PROPOFOL  N/A 08/17/2022   Procedure: ESOPHAGOGASTRODUODENOSCOPY (EGD) WITH PROPOFOL ;  Surgeon: Maryruth Ole DASEN, MD;  Location: [facility] ENDOSCOPY;  Service: Endoscopy;  Laterality: N/A;   EYE SURGERY     bilateral cataract extraction   SHOULDER ARTHROSCOPY Right 05/07/2015   Procedure: Extensive arthroscopic debridement and arthroscopic subacromial decompression, right shoulder.                        2.  Steroid injection right thumb CMC joint.;  Surgeon: Norleen JINNY Maltos, MD;  Location: [facility];  Service: Orthopedics;  Laterality: Right;   TOOTH EXTRACTION     Upper centrals and lateral   Patient Active Problem List   Diagnosis Date Noted   Right foot pain 11/01/2023   Arthralgia of left ankle 10/20/2021   Osteopenia determined by  x-ray 03/31/2021   Primary osteoarthritis of left knee 01/22/2021   Effusion, right knee 12/15/2020   Primary osteoarthritis of right knee 11/24/2020   Overactive bladder 06/18/2020   Prediabetes 04/14/2020   Slow transit constipation 06/05/2019   Sciatica, left side 01/01/2019   Diverticulosis of colon 04/25/2018   Change in bowel habits    Tobacco use disorder, moderate, in sustained remission 12/22/2017   Moderate episode of recurrent major depressive disorder (HCC) 10/03/2017   Inflammatory spondylopathy of lumbar region 10/03/2017   Hoarseness, persistent 04/18/2017   OSA on CPAP 02/18/2017   Cervical disc disease 12/31/2016   Generalized osteoarthritis 12/23/2016   Chronic pain syndrome 12/23/2016   Cystocele, midline 12/06/2016   Urinary incontinence 12/06/2016   Impingement syndrome  of right shoulder 04/17/2015   Fibrocystic breast 01/18/2015   Gastroesophageal reflux disease without esophagitis 01/18/2015   Hypothyroidism, postablative 01/18/2015   Arteriosclerosis of coronary artery 12/29/2014   DJD of shoulder 11/13/2014   Fibromyalgia 10/02/2014   Intercostal neuralgia 10/02/2014   Facet syndrome, lumbar 10/02/2014   Sacroiliac joint disease 10/02/2014   Carotid artery narrowing 07/09/2014   Essential hypertension 07/01/2014   Osteoarthritis of thumb 09/24/2012   Mixed hyperlipidemia 09/22/2012   Arthritis of hand, degenerative 09/22/2012    PCP: Justus Leita DEL, MD  REFERRING PROVIDER: Clois Fret, MD  REFERRING DIAG: M43.16 (ICD-10-CM) - Spondylolisthesis of lumbar region   RATIONALE FOR EVALUATION AND TREATMENT: Rehabilitation  THERAPY DIAG: Chronic bilateral low back pain, unspecified whether sciatica present  Difficulty in walking, not elsewhere classified  Muscle weakness (generalized)  Spinal stenosis of lumbar region with neurogenic claudication  ONSET DATE: Chronic (10-year history of sciatica episodes with recurrent ESI; ESI in Sept 2025 did not alleviate pain, ongoing worsening symptoms)  FOLLOW-UP APPT SCHEDULED WITH REFERRING PROVIDER: Yes ; f/u with neurosurgery in Jan 2026 for lumbar fusion  PERTINENT HISTORY: Pt is a 78 year old female with hx of chronic back pain/lumbar spinal stenosis. Hx of fibromyalgia.    She now has lumbar fusion planned for 03/21/2024. She has experienced significant back pain for the past ten years, initially affecting her left sciatic nerve. Pain management injections were effective until September, but the pain has worsened over the past two months. The pain is centered in her lower back, radiating down both legs and stopping just above ankle, and is exacerbated by walking or standing for prolonged periods. She uses a walking stick for support and wears a brace when active.   Pt has known grade 1  anterolisthesis at L4-5. MRI shows facet osteoarthritis focally advanced at L4-5. Focally advanced disc degeneration at L5-S1 with progression from 2011. Mild foraminal narrowing at this level.  Coughing and sneezing exacerbate symptoms. Patient reports pain with urinating and having bowel movement due to back pain.   PAIN:    Pain Intensity: Present: 4/10, Best: 4/10, Worst: 10/10 Pain location: Across back, lumbosacral level Pain Quality: burning, shooting Radiating: Yes; down LEs and stopping just short of ankles (L worse than R) Numbness/Tingling: No Focal Weakness: No Aggravating factors: standing, bending over, walking Relieving factors: on the move; sitting on recliner/reclined position; heating pad  24-hour pain behavior: significant pain in AM and getting out of bed  How long can you stand: 15-20 min standing History of prior back injury, pain, surgery, or therapy: Yes; No hx of back surgery; chronic back pain with repeated ESIs, surgery planned for January 2026  Imaging: Yes ;  MR Lumbar spine; 05/24/23 Facet osteoarthritis focally advanced at  L4-5 where there is anterolisthesis and advanced spinal stenosis that is progressed from 2011. Focally advanced disc degeneration at L5-S1 with progression from 2011. Mild foraminal narrowing at this level.   Red flags: Negative for bowel/bladder changes, saddle paresthesia, personal history of cancer, h/o spinal tumors, h/o compression fx, h/o abdominal aneurysm, abdominal pain, chills/fever, night sweats, nausea, vomiting, unrelenting pain, first onset of insidious LBP <20 y/o  PRECAUTIONS: None  WEIGHT BEARING RESTRICTIONS: No  FALLS: Has patient fallen in last 6 months? No  Living Environment Lives with: lives alone Lives in: House/apartment; one level inside home Stairs: 1 step into home from garage Has following equipment at home: rubber non-slip mat in shower  Prior level of function: Independent  Occupational  demands: Retired  Presenter, Broadcasting: Cooking, walking exercise routine  Patient Goals: Improve pain and try to manage pain prior to surgery    OBJECTIVE (data from initial evaluation unless otherwise dated):   Patient Surveys  Modified Oswestry: 22/50 = 44%  Cognition Patient is oriented to person, place, and time.  Recent memory is intact.  Remote memory is intact.  Attention span and concentration are intact.  Expressive speech is intact.  Patient's fund of knowledge is within normal limits for educational level.    Gross Musculoskeletal Assessment Tremor: None Bulk: Normal Tone: Normal No visible step-off along spinal column, no signs of scoliosis   GAIT: Distance walked: 40 fit Assistive device utilized: Walking stick, L side Level of assistance: SBA Comments: Guarded posture, L>R pelvic drop with dec trunk rotation, dec arm swing  Posture: Lumbar lordosis: Decreased Increased thoracic kyphosis Iliac crest height: Equal bilaterally Lumbar lateral shift: Negative  AROM AROM (Normal range in degrees) AROM  02/07/2024  Lumbar   Flexion (65) 75%*  Extension (30) 75%*  Right lateral flexion (25) 75%*  Left lateral flexion (25) 75%*  Right rotation (30) WNL  Left rotation (30) 75%*       Hip Right Left  Flexion (125) WNL WNL  Extension (15)    Abduction (40)    Adduction     Internal Rotation (45)    External Rotation (45) WNL WNL      Knee    Flexion (135)    Extension (0)        Ankle    Dorsiflexion (20)    Plantarflexion (50)    Inversion (35)    Eversion (15)    (* = pain; Blank rows = not tested)  LE MMT: MMT (out of 5) Right 02/07/2024 Left 02/07/2024  Hip flexion 4 4-  Hip extension    Hip abduction 4* 4*  Hip adduction    Hip internal rotation    Hip external rotation    Knee flexion 5   Knee extension 4+* 4*  Ankle dorsiflexion 4+ 4-  Ankle plantarflexion    Ankle inversion    Ankle eversion    (* = pain; Blank rows = not  tested)  Sensation Grossly intact to light touch throughout bilateral LEs as determined by testing dermatomes L2-S2. Proprioception, stereognosis, and hot/cold testing deferred on this date.  Reflexes R/L Knee Jerk (L3/4): 2+/2+  Ankle Jerk (S1/2): 2+/2+  Clonus: Negative Hoffman's Negative  Muscle Length Hamstrings: R: Only 10 deg deficit; L: Only 10 deg deficit  Ely (quadriceps): R: Not examined L: Not examined Thomas (hip flexors): R: Not examined L: Not examined   Palpation Location Right Left         Lumbar paraspinals   1 2  Quadratus Lumborum    Gluteus Maximus 1 1  Gluteus Medius 1 1  Deep hip external rotators    PSIS 2 2  Fortin's Area (SIJ)    Greater Trochanter    (Blank rows = not tested) Graded on 0-4 scale (0 = no pain, 1 = pain, 2 = pain with wincing/grimacing/flinching, 3 = pain with withdrawal, 4 = unwilling to allow palpation)  Passive Accessory  Motion Deferred  Special Tests (02/13/24) Lumbar Radiculopathy and Discogenic: Centralization and Peripheralization (SN 92, -LR 0.12):  -Repeated flexion in lying: no effect during, moderate gluteal pain R>L immediately afterward; (minimal pain in lying after rest break) Slump (SN 83, -LR 0.32): R: Negative  L: Positive   SLR (SN 92, -LR 0.29): R: Negative   L: Positive   General lumbar traction in hooklying: No effect  Facet Joint: Extension-Rotation (SN 100, -LR 0.0): R: Positive   L: Positive    Lumbar Foraminal Stenosis: Lumbar quadrant (SN 70): R: Negative   L: Negative    Hip: FABER (SN 81): R: Positive for back pain   L: Positive for back pain       TODAY'S TREATMENT: DATE: 02/15/2024   SUBJECTIVE STATEMENT:   Patient reports 7-8/10 pain at arrival to PT. She reports being busy with errands and appointments, and completing this activity aggravates her symptoms. Pt reports he completed exercises very little at home, and she reports limited tolerance.     Therapeutic Exercise - for improved  soft tissue flexibility and extensibility as needed for ROM, improved strength as needed to improve performance of CKC activities/functional movements  Repeated flexion in lying: 1 x 10  -no pain with moving into end-range, return to baseline after completion of movement   Lower trunk rotations; 1 x  10 alt R/L  Piriformis stretch; 2 x 30 sec, bilat   Seated sciatic nerve flossing; 1 x 10, for HEP review   *Reviewed log rolling technique to improve tolerance of bed mobility/transfers   PATIENT EDUCATION: HEP updated and Med[employer] program updated.     Neuromuscular Re-education - for nervous system downregulation, desensitization   STM and IASTM with Hypervolt along bilat longissimus lumborum L3-S1 and bilateral gluteal musculature; x 12 minutes   IFC e-stim with moist heat pack - pt in prone lying; for symptom modulation/analgesic effect; carrier frequency 4000 Hz, 21 V X 10 minutes    PATIENT EDUCATION:  Education details: see above for patient education details Person educated: Patient Education method: Explanation and Demonstration Education comprehension: verbalized understanding   HOME EXERCISE PROGRAM:  Access Code: [email or web address] URL: [email or web address] Date: 02/15/2024 Prepared by: Venetia Endo  Exercises - Supine Double Knee to Chest  - 5-6 x daily - 7 x weekly - 1 sets - 10 reps - 1sec hold - Seated Slump Nerve Glide  - 2 x daily - 7 x weekly - 2 sets - 10 reps - 1sec hold - Supine Lower Trunk Rotation  - 2 x daily - 7 x weekly - 2 sets - 10 reps - 2 sec hold - Supine Piriformis Stretch with Foot on Ground  - 2 x daily - 7 x weekly - 3 sets - 30sec hold   ASSESSMENT:  CLINICAL IMPRESSION: Patient has equivocal response to manual lumbar traction and ongoing severe pain and mobility deficits related to L>R-sided low back pain and LE referred symptoms. Patient has limited tolerance to exercise and hyperalgesia of L iliolumbar and gluteal  region. Manual therapy is limited today due to significant  hyperalgesia. We utilized e-stim/IFC and MHP with good treatment effect. We reviewed exercise techniques and discussed expectations for soreness/low-level discomfort with given exercise. HEP was updated to include gentle L-spine mobility in hooklying and gluteal stretching. Pt will continue to benefit from skilled PT services to address deficits and improve function.   OBJECTIVE IMPAIRMENTS: Abnormal gait, difficulty walking, decreased ROM, decreased strength, impaired flexibility, postural dysfunction, and pain.   ACTIVITY LIMITATIONS: carrying, lifting, bending, sitting, sleeping, transfers, and locomotion level  PARTICIPATION LIMITATIONS: meal prep, driving, shopping, community activity, and church  PERSONAL FACTORS: Age, Past/current experiences, Time since onset of injury/illness/exacerbation, and 3+ comorbidities: (anxiety, CAD, emphysema, fibromyalgia, Grave's disease, RA, OA, HLD, HTN) are also affecting patient's functional outcome.   REHAB POTENTIAL: Fair due to significant comorbidities, chronicity of pain, fibromyalgia, and upcoming planned fusion  CLINICAL DECISION MAKING: Unstable/unpredictable  EVALUATION COMPLEXITY: High   GOALS: Goals reviewed with patient? No  SHORT TERM GOALS: Target date: 02/29/2024  Pt will be independent with HEP in order to improve strength and decrease back pain to improve pain-free function at home and work. Baseline: 02/08/2024: Baseline HEP initiated Goal status: INITIAL   LONG TERM GOALS: Target date: 03/21/2024  Patient will have full thoracolumbar AROM without reproduction of pain as needed for reaching items on ground, household chores, bending. Baseline: 02/08/24: Decreased flexion and extension ROM, pain with extension/concordant sign. Goal status: INITIAL  2.  Pt will decrease worst back pain by at least 2 points on the NPRS in order to demonstrate clinically significant  reduction in back pain. Baseline: 111/26/25: 10/10 at worst Goal status: INITIAL  3.  Pt will decrease mODI score by at least 10 points in order demonstrate clinically significant reduction in back pain/disability.       Baseline: 02/08/24:  22/50 = 44% Goal status: INITIAL  4.  Pt will be able to complete community-level ambulation with LRAD without exacerbation of back pain as needed for accessing local community and church/philanthropic events Baseline: 02/08/24: Severe pain with prolonged walking and standing Goal status: INITIAL   PLAN: PT FREQUENCY: 1-2x/week  PT DURATION: 6 weeks  PLANNED INTERVENTIONS: Therapeutic exercises, Therapeutic activity, Neuromuscular re-education, Balance training, Gait training, Patient/Family education, Self Care, Joint mobilization, Joint manipulation, Vestibular training, Canalith repositioning, Orthotic/Fit training, DME instructions, Dry Needling, Electrical stimulation, Spinal manipulation, Spinal mobilization, Cryotherapy, Moist heat, Taping, Traction, Ultrasound, Ionotophoresis 4mg /ml Dexamethasone , Manual therapy, and Re-evaluation.  PLAN FOR NEXT SESSION: Follow-up on response with repeated flexion. Manual techniques prn for symptom modulation and consideration of modalities (e-stim, TENS) for severe pain.    Venetia Endo, PT, DPT #[id number]  Venetia ONEIDA Endo, PT 02/15/2024, 3:41 PM

## 2024-02-15 NOTE — Patient Instructions (Signed)
 I value your feedback and you entrusting [place] with your care. If you get a [place] patient survey, I would appreciate you taking the time to let us know about your experience today. Thank you! ? ? ?

## 2024-02-22 ENCOUNTER — Encounter: Payer: Self-pay | Admitting: Physical Therapy

## 2024-02-22 ENCOUNTER — Ambulatory Visit: Admitting: Physical Therapy

## 2024-02-22 DIAGNOSIS — M48062 Spinal stenosis, lumbar region with neurogenic claudication: Secondary | ICD-10-CM

## 2024-02-22 DIAGNOSIS — M545 Low back pain, unspecified: Secondary | ICD-10-CM | POA: Diagnosis not present

## 2024-02-22 DIAGNOSIS — M6281 Muscle weakness (generalized): Secondary | ICD-10-CM

## 2024-02-22 DIAGNOSIS — R262 Difficulty in walking, not elsewhere classified: Secondary | ICD-10-CM

## 2024-02-22 NOTE — Therapy (Signed)
 OUTPATIENT PHYSICAL THERAPY THORACOLUMBAR/LOWER QUARTER TREATMENT   Patient Name: CAREL SCHNEE MRN: [id number] DOB:11-Apr-1945, 78 y.o., female Today's Date: 02/22/2024   END OF SESSION:  PT End of Session - 02/22/24 1106     Visit Number 4    Number of Visits 13    Date for Recertification  03/22/24    PT Start Time 1106    PT Stop Time 1148    PT Time Calculation (min) 42 min    Activity Tolerance Patient tolerated treatment well    Behavior During Therapy [facility] for tasks assessed/performed           Past Medical History:  Diagnosis Date   Allergy    Anxiety    Arthritis    neck, hands, lower back   Bilateral dry eyes    Chronic low back pain 12/23/2016   Coronary artery disease    DDD (degenerative disc disease), lumbar 10/02/2014   Dental crowns present    Implants, front flipper while waiting for other implants   Emphysema of lung (HCC)    Fibromyalgia    Fibromyalgia    Fracture of right foot 01/14/2015   GERD (gastroesophageal reflux disease)    History of Graves' disease    Hyperlipidemia    Hypertension    Hypothyroidism    Myocardial infarction (HCC) 04/22/2006   Osteoarthritis    Other shoulder lesions, right shoulder 04/17/2015   Rheumatoid arthritis (HCC)    Sciatica, left side 01/01/2019   Sleep apnea    CPAP   Status post shoulder surgery 05/27/2015   Thyroid  disease    Vertigo    none for approx 9 yrs   Past Surgical History:  Procedure Laterality Date   ABDOMINAL HYSTERECTOMY     total   APPENDECTOMY     BREAST BIOPSY Right    neg   BREAST CYST ASPIRATION Left    neg   BREAST EXCISIONAL BIOPSY Right    BUNIONECTOMY Bilateral    CESAREAN SECTION     COLONOSCOPY  2010   normal   COLONOSCOPY WITH PROPOFOL  N/A 04/03/2018   Procedure: COLONOSCOPY WITH BIOPSIES;  Surgeon: Jinny Carmine, MD;  Location: [facility];  Service: Endoscopy;  Laterality: N/A;  sleep apnea   CORONARY ANGIOPLASTY WITH STENT PLACEMENT  04/2006    dental implant     [age]   DILATION AND CURETTAGE OF UTERUS     epidural steroid injection  2018   ESOPHAGOGASTRODUODENOSCOPY N/A 10/03/2020   Procedure: ESOPHAGOGASTRODUODENOSCOPY (EGD);  Surgeon: Maryruth Ole DASEN, MD;  Location: [facility];  Service: Endoscopy;  Laterality: N/A;  REQUESTING MORNING AFTER 9 AM   ESOPHAGOGASTRODUODENOSCOPY (EGD) WITH PROPOFOL  N/A 08/17/2022   Procedure: ESOPHAGOGASTRODUODENOSCOPY (EGD) WITH PROPOFOL ;  Surgeon: Maryruth Ole DASEN, MD;  Location: [facility];  Service: Endoscopy;  Laterality: N/A;   EYE SURGERY     bilateral cataract extraction   SHOULDER ARTHROSCOPY Right 05/07/2015   Procedure: Extensive arthroscopic debridement and arthroscopic subacromial decompression, right shoulder.                        2.  Steroid injection right thumb CMC joint.;  Surgeon: Norleen JINNY Maltos, MD;  Location: [facility];  Service: Orthopedics;  Laterality: Right;   TOOTH EXTRACTION     Upper centrals and lateral   Patient Active Problem List   Diagnosis Date Noted   Right foot pain 11/01/2023   Arthralgia of left ankle 10/20/2021   Osteopenia determined  by x-ray 03/31/2021   Primary osteoarthritis of left knee 01/22/2021   Effusion, right knee 12/15/2020   Primary osteoarthritis of right knee 11/24/2020   Overactive bladder 06/18/2020   Prediabetes 04/14/2020   Slow transit constipation 06/05/2019   Sciatica, left side 01/01/2019   Diverticulosis of colon 04/25/2018   Change in bowel habits    Tobacco use disorder, moderate, in sustained remission 12/22/2017   Moderate episode of recurrent major depressive disorder (HCC) 10/03/2017   Inflammatory spondylopathy of lumbar region 10/03/2017   Hoarseness, persistent 04/18/2017   OSA on CPAP 02/18/2017   Cervical disc disease 12/31/2016   Generalized osteoarthritis 12/23/2016   Chronic pain syndrome 12/23/2016   Cystocele, midline 12/06/2016   Urinary incontinence 12/06/2016   Impingement syndrome  of right shoulder 04/17/2015   Fibrocystic breast 01/18/2015   Gastroesophageal reflux disease without esophagitis 01/18/2015   Hypothyroidism, postablative 01/18/2015   Arteriosclerosis of coronary artery 12/29/2014   DJD of shoulder 11/13/2014   Fibromyalgia 10/02/2014   Intercostal neuralgia 10/02/2014   Facet syndrome, lumbar 10/02/2014   Sacroiliac joint disease 10/02/2014   Carotid artery narrowing 07/09/2014   Essential hypertension 07/01/2014   Osteoarthritis of thumb 09/24/2012   Mixed hyperlipidemia 09/22/2012   Arthritis of hand, degenerative 09/22/2012    PCP: Justus Leita DEL, MD  REFERRING PROVIDER: Clois Fret, MD  REFERRING DIAG: M43.16 (ICD-10-CM) - Spondylolisthesis of lumbar region   RATIONALE FOR EVALUATION AND TREATMENT: Rehabilitation  THERAPY DIAG: Chronic bilateral low back pain, unspecified whether sciatica present  Difficulty in walking, not elsewhere classified  Muscle weakness (generalized)  Spinal stenosis of lumbar region with neurogenic claudication  ONSET DATE: Chronic (10-year history of sciatica episodes with recurrent ESI; ESI in Sept 2025 did not alleviate pain, ongoing worsening symptoms)  FOLLOW-UP APPT SCHEDULED WITH REFERRING PROVIDER: Yes ; f/u with neurosurgery in Jan 2026 for lumbar fusion  PERTINENT HISTORY: Pt is a 78 year old female with hx of chronic back pain/lumbar spinal stenosis. Hx of fibromyalgia.    She now has lumbar fusion planned for 03/21/2024. She has experienced significant back pain for the past ten years, initially affecting her left sciatic nerve. Pain management injections were effective until September, but the pain has worsened over the past two months. The pain is centered in her lower back, radiating down both legs and stopping just above ankle, and is exacerbated by walking or standing for prolonged periods. She uses a walking stick for support and wears a brace when active.   Pt has known grade 1  anterolisthesis at L4-5. MRI shows facet osteoarthritis focally advanced at L4-5. Focally advanced disc degeneration at L5-S1 with progression from 2011. Mild foraminal narrowing at this level.  Coughing and sneezing exacerbate symptoms. Patient reports pain with urinating and having bowel movement due to back pain.   PAIN:    Pain Intensity: Present: 4/10, Best: 4/10, Worst: 10/10 Pain location: Across back, lumbosacral level Pain Quality: burning, shooting Radiating: Yes; down LEs and stopping just short of ankles (L worse than R) Numbness/Tingling: No Focal Weakness: No Aggravating factors: standing, bending over, walking Relieving factors: on the move; sitting on recliner/reclined position; heating pad  24-hour pain behavior: significant pain in AM and getting out of bed  How long can you stand: 15-20 min standing History of prior back injury, pain, surgery, or therapy: Yes; No hx of back surgery; chronic back pain with repeated ESIs, surgery planned for January 2026  Imaging: Yes ;  MR Lumbar spine; 05/24/23 Facet osteoarthritis focally advanced  at L4-5 where there is anterolisthesis and advanced spinal stenosis that is progressed from 2011. Focally advanced disc degeneration at L5-S1 with progression from 2011. Mild foraminal narrowing at this level.   Red flags: Negative for bowel/bladder changes, saddle paresthesia, personal history of cancer, h/o spinal tumors, h/o compression fx, h/o abdominal aneurysm, abdominal pain, chills/fever, night sweats, nausea, vomiting, unrelenting pain, first onset of insidious LBP <20 y/o  PRECAUTIONS: None  WEIGHT BEARING RESTRICTIONS: No  FALLS: Has patient fallen in last 6 months? No  Living Environment Lives with: lives alone Lives in: House/apartment; one level inside home Stairs: 1 step into home from garage Has following equipment at home: rubber non-slip mat in shower  Prior level of function: Independent  Occupational  demands: Retired  Presenter, Broadcasting: Cooking, walking exercise routine  Patient Goals: Improve pain and try to manage pain prior to surgery    OBJECTIVE (data from initial evaluation unless otherwise dated):   Patient Surveys  Modified Oswestry: 22/50 = 44%  Cognition Patient is oriented to person, place, and time.  Recent memory is intact.  Remote memory is intact.  Attention span and concentration are intact.  Expressive speech is intact.  Patient's fund of knowledge is within normal limits for educational level.    Gross Musculoskeletal Assessment Tremor: None Bulk: Normal Tone: Normal No visible step-off along spinal column, no signs of scoliosis   GAIT: Distance walked: 40 fit Assistive device utilized: Walking stick, L side Level of assistance: SBA Comments: Guarded posture, L>R pelvic drop with dec trunk rotation, dec arm swing  Posture: Lumbar lordosis: Decreased Increased thoracic kyphosis Iliac crest height: Equal bilaterally Lumbar lateral shift: Negative  AROM AROM (Normal range in degrees) AROM  02/07/2024  Lumbar   Flexion (65) 75%*  Extension (30) 75%*  Right lateral flexion (25) 75%*  Left lateral flexion (25) 75%*  Right rotation (30) WNL  Left rotation (30) 75%*       Hip Right Left  Flexion (125) WNL WNL  Extension (15)    Abduction (40)    Adduction     Internal Rotation (45)    External Rotation (45) WNL WNL      Knee    Flexion (135)    Extension (0)        Ankle    Dorsiflexion (20)    Plantarflexion (50)    Inversion (35)    Eversion (15)    (* = pain; Blank rows = not tested)  LE MMT: MMT (out of 5) Right 02/07/2024 Left 02/07/2024  Hip flexion 4 4-  Hip extension    Hip abduction 4* 4*  Hip adduction    Hip internal rotation    Hip external rotation    Knee flexion 5   Knee extension 4+* 4*  Ankle dorsiflexion 4+ 4-  Ankle plantarflexion    Ankle inversion    Ankle eversion    (* = pain; Blank rows = not  tested)  Sensation Grossly intact to light touch throughout bilateral LEs as determined by testing dermatomes L2-S2. Proprioception, stereognosis, and hot/cold testing deferred on this date.  Reflexes R/L Knee Jerk (L3/4): 2+/2+  Ankle Jerk (S1/2): 2+/2+  Clonus: Negative Hoffman's Negative  Muscle Length Hamstrings: R: Only 10 deg deficit; L: Only 10 deg deficit  Ely (quadriceps): R: Not examined L: Not examined Thomas (hip flexors): R: Not examined L: Not examined   Palpation Location Right Left         Lumbar paraspinals   1 2  Quadratus Lumborum    Gluteus Maximus 1 1  Gluteus Medius 1 1  Deep hip external rotators    PSIS 2 2  Fortin's Area (SIJ)    Greater Trochanter    (Blank rows = not tested) Graded on 0-4 scale (0 = no pain, 1 = pain, 2 = pain with wincing/grimacing/flinching, 3 = pain with withdrawal, 4 = unwilling to allow palpation)  Passive Accessory  Motion Deferred  Special Tests (02/13/24) Lumbar Radiculopathy and Discogenic: Centralization and Peripheralization (SN 92, -LR 0.12):  -Repeated flexion in lying: no effect during, moderate gluteal pain R>L immediately afterward; (minimal pain in lying after rest break) Slump (SN 83, -LR 0.32): R: Negative  L: Positive   SLR (SN 92, -LR 0.29): R: Negative   L: Positive   General lumbar traction in hooklying: No effect  Facet Joint: Extension-Rotation (SN 100, -LR 0.0): R: Positive   L: Positive    Lumbar Foraminal Stenosis: Lumbar quadrant (SN 70): R: Negative   L: Negative    Hip: FABER (SN 81): R: Positive for back pain   L: Positive for back pain       TODAY'S TREATMENT: DATE: 02/22/2024    SUBJECTIVE STATEMENT:   Patient reports no pain on arrival. She reports the estim feels good but does not provide relief.    Therapeutic Exercise - for improved soft tissue flexibility and extensibility as needed for ROM, improved strength as needed to improve performance of CKC activities/functional  movements  Nustep level 2 (seat 10) x 5 minutes to improve trunk mobility and overall BLE strength - MH applied to low back during  Seated marching 2 x 10 each LE - cues for core activation  Seated marching while seated on green dynadisc 2 x 10 each LE  Seated forward trunk flexion 2 x 10   *Reviewed log rolling technique to improve tolerance of bed mobility/transfers   Neuromuscular Re-education - for nervous system downregulation, desensitization   STM and IASTM with Hypervolt along bilat longissimus lumborum L3-S1 and bilateral gluteal musculature; x 25 minutes    PATIENT EDUCATION:  Education details: see above for patient education details Person educated: Patient Education method: Explanation and Demonstration Education comprehension: verbalized understanding   HOME EXERCISE PROGRAM:  Access Code: [email or web address] URL: [email or web address] Date: 02/15/2024 Prepared by: Venetia Endo  Exercises - Supine Double Knee to Chest  - 5-6 x daily - 7 x weekly - 1 sets - 10 reps - 1sec hold - Seated Slump Nerve Glide  - 2 x daily - 7 x weekly - 2 sets - 10 reps - 1sec hold - Supine Lower Trunk Rotation  - 2 x daily - 7 x weekly - 2 sets - 10 reps - 2 sec hold - Supine Piriformis Stretch with Foot on Ground  - 2 x daily - 7 x weekly - 3 sets - 30sec hold   ASSESSMENT:  CLINICAL IMPRESSION:   Patient has limited tolerance to exercise and hyperalgesia of L iliolumbar and gluteal region. Session focused on manual therapy and core activation/strengthening. HEP was updated to include gentle L-spine mobility in hooklying and gluteal stretching. Pt will continue to benefit from skilled PT services to address deficits and improve function.   OBJECTIVE IMPAIRMENTS: Abnormal gait, difficulty walking, decreased ROM, decreased strength, impaired flexibility, postural dysfunction, and pain.   ACTIVITY LIMITATIONS: carrying, lifting, bending, sitting, sleeping, transfers, and  locomotion level  PARTICIPATION LIMITATIONS: meal prep, driving, shopping, community activity, and church  PERSONAL FACTORS: Age, Past/current experiences,  Time since onset of injury/illness/exacerbation, and 3+ comorbidities: (anxiety, CAD, emphysema, fibromyalgia, Grave's disease, RA, OA, HLD, HTN) are also affecting patient's functional outcome.   REHAB POTENTIAL: Fair due to significant comorbidities, chronicity of pain, fibromyalgia, and upcoming planned fusion  CLINICAL DECISION MAKING: Unstable/unpredictable  EVALUATION COMPLEXITY: High   GOALS: Goals reviewed with patient? No  SHORT TERM GOALS: Target date: 02/29/2024  Pt will be independent with HEP in order to improve strength and decrease back pain to improve pain-free function at home and work. Baseline: 02/08/2024: Baseline HEP initiated Goal status: INITIAL   LONG TERM GOALS: Target date: 03/21/2024  Patient will have full thoracolumbar AROM without reproduction of pain as needed for reaching items on ground, household chores, bending. Baseline: 02/08/24: Decreased flexion and extension ROM, pain with extension/concordant sign. Goal status: INITIAL  2.  Pt will decrease worst back pain by at least 2 points on the NPRS in order to demonstrate clinically significant reduction in back pain. Baseline: 111/26/25: 10/10 at worst Goal status: INITIAL  3.  Pt will decrease mODI score by at least 10 points in order demonstrate clinically significant reduction in back pain/disability.       Baseline: 02/08/24:  22/50 = 44% Goal status: INITIAL  4.  Pt will be able to complete community-level ambulation with LRAD without exacerbation of back pain as needed for accessing local community and church/philanthropic events Baseline: 02/08/24: Severe pain with prolonged walking and standing Goal status: INITIAL   PLAN: PT FREQUENCY: 1-2x/week  PT DURATION: 6 weeks  PLANNED INTERVENTIONS: Therapeutic exercises, Therapeutic  activity, Neuromuscular re-education, Balance training, Gait training, Patient/Family education, Self Care, Joint mobilization, Joint manipulation, Vestibular training, Canalith repositioning, Orthotic/Fit training, DME instructions, Dry Needling, Electrical stimulation, Spinal manipulation, Spinal mobilization, Cryotherapy, Moist heat, Taping, Traction, Ultrasound, Ionotophoresis 4mg /ml Dexamethasone , Manual therapy, and Re-evaluation.  PLAN FOR NEXT SESSION: Follow-up on response with repeated flexion. Manual techniques prn for symptom modulation and consideration of modalities (e-stim, TENS) for severe pain.    Maryanne Finder, PT, DPT Physical Therapist - [facility]  [facility]  Maryanne DELENA Finder, PT 02/22/2024, 11:07 AM

## 2024-02-23 DIAGNOSIS — M2041 Other hammer toe(s) (acquired), right foot: Secondary | ICD-10-CM | POA: Diagnosis not present

## 2024-02-23 DIAGNOSIS — L97511 Non-pressure chronic ulcer of other part of right foot limited to breakdown of skin: Secondary | ICD-10-CM | POA: Diagnosis not present

## 2024-02-23 DIAGNOSIS — M2011 Hallux valgus (acquired), right foot: Secondary | ICD-10-CM | POA: Diagnosis not present

## 2024-02-29 ENCOUNTER — Encounter: Payer: Self-pay | Admitting: Physical Therapy

## 2024-02-29 ENCOUNTER — Ambulatory Visit: Admitting: Physical Therapy

## 2024-02-29 DIAGNOSIS — R262 Difficulty in walking, not elsewhere classified: Secondary | ICD-10-CM

## 2024-02-29 DIAGNOSIS — Z01818 Encounter for other preprocedural examination: Secondary | ICD-10-CM | POA: Diagnosis not present

## 2024-02-29 DIAGNOSIS — M545 Low back pain, unspecified: Secondary | ICD-10-CM | POA: Diagnosis not present

## 2024-02-29 DIAGNOSIS — M48062 Spinal stenosis, lumbar region with neurogenic claudication: Secondary | ICD-10-CM

## 2024-02-29 DIAGNOSIS — I10 Essential (primary) hypertension: Secondary | ICD-10-CM | POA: Diagnosis not present

## 2024-02-29 DIAGNOSIS — M6281 Muscle weakness (generalized): Secondary | ICD-10-CM

## 2024-02-29 DIAGNOSIS — E7800 Pure hypercholesterolemia, unspecified: Secondary | ICD-10-CM | POA: Diagnosis not present

## 2024-02-29 DIAGNOSIS — I2510 Atherosclerotic heart disease of native coronary artery without angina pectoris: Secondary | ICD-10-CM | POA: Diagnosis not present

## 2024-02-29 NOTE — Therapy (Signed)
 OUTPATIENT PHYSICAL THERAPY THORACOLUMBAR/LOWER QUARTER TREATMENT   Patient Name: Carrie Myers MRN: [id number] DOB:1946-03-08, 78 y.o., female Today's Date: 02/29/2024   END OF SESSION:  PT End of Session - 02/29/24 1105     Visit Number 5    Number of Visits 13    Date for Recertification  03/22/24    PT Start Time 1111    PT Stop Time 1210    PT Time Calculation (min) 59 min    Activity Tolerance Patient tolerated treatment well    Behavior During Therapy [facility] for tasks assessed/performed           Past Medical History:  Diagnosis Date   Allergy    Anxiety    Arthritis    neck, hands, lower back   Bilateral dry eyes    Chronic low back pain 12/23/2016   Coronary artery disease    DDD (degenerative disc disease), lumbar 10/02/2014   Dental crowns present    Implants, front flipper while waiting for other implants   Emphysema of lung (HCC)    Fibromyalgia    Fibromyalgia    Fracture of right foot 01/14/2015   GERD (gastroesophageal reflux disease)    History of Graves' disease    Hyperlipidemia    Hypertension    Hypothyroidism    Myocardial infarction (HCC) 04/22/2006   Osteoarthritis    Other shoulder lesions, right shoulder 04/17/2015   Rheumatoid arthritis (HCC)    Sciatica, left side 01/01/2019   Sleep apnea    CPAP   Status post shoulder surgery 05/27/2015   Thyroid  disease    Vertigo    none for approx 9 yrs   Past Surgical History:  Procedure Laterality Date   ABDOMINAL HYSTERECTOMY     total   APPENDECTOMY     BREAST BIOPSY Right    neg   BREAST CYST ASPIRATION Left    neg   BREAST EXCISIONAL BIOPSY Right    BUNIONECTOMY Bilateral    CESAREAN SECTION     COLONOSCOPY  2010   normal   COLONOSCOPY WITH PROPOFOL  N/A 04/03/2018   Procedure: COLONOSCOPY WITH BIOPSIES;  Surgeon: Jinny Carmine, MD;  Location: [facility];  Service: Endoscopy;  Laterality: N/A;  sleep apnea   CORONARY ANGIOPLASTY WITH STENT PLACEMENT  04/2006    dental implant     [age]   DILATION AND CURETTAGE OF UTERUS     epidural steroid injection  2018   ESOPHAGOGASTRODUODENOSCOPY N/A 10/03/2020   Procedure: ESOPHAGOGASTRODUODENOSCOPY (EGD);  Surgeon: Maryruth Ole DASEN, MD;  Location: [facility];  Service: Endoscopy;  Laterality: N/A;  REQUESTING MORNING AFTER 9 AM   ESOPHAGOGASTRODUODENOSCOPY (EGD) WITH PROPOFOL  N/A 08/17/2022   Procedure: ESOPHAGOGASTRODUODENOSCOPY (EGD) WITH PROPOFOL ;  Surgeon: Maryruth Ole DASEN, MD;  Location: [facility];  Service: Endoscopy;  Laterality: N/A;   EYE SURGERY     bilateral cataract extraction   SHOULDER ARTHROSCOPY Right 05/07/2015   Procedure: Extensive arthroscopic debridement and arthroscopic subacromial decompression, right shoulder.                        2.  Steroid injection right thumb CMC joint.;  Surgeon: Norleen JINNY Maltos, MD;  Location: [facility];  Service: Orthopedics;  Laterality: Right;   TOOTH EXTRACTION     Upper centrals and lateral   Patient Active Problem List   Diagnosis Date Noted   Right foot pain 11/01/2023   Arthralgia of left ankle 10/20/2021   Osteopenia determined  by x-ray 03/31/2021   Primary osteoarthritis of left knee 01/22/2021   Effusion, right knee 12/15/2020   Primary osteoarthritis of right knee 11/24/2020   Overactive bladder 06/18/2020   Prediabetes 04/14/2020   Slow transit constipation 06/05/2019   Sciatica, left side 01/01/2019   Diverticulosis of colon 04/25/2018   Change in bowel habits    Tobacco use disorder, moderate, in sustained remission 12/22/2017   Moderate episode of recurrent major depressive disorder (HCC) 10/03/2017   Inflammatory spondylopathy of lumbar region 10/03/2017   Hoarseness, persistent 04/18/2017   OSA on CPAP 02/18/2017   Cervical disc disease 12/31/2016   Generalized osteoarthritis 12/23/2016   Chronic pain syndrome 12/23/2016   Cystocele, midline 12/06/2016   Urinary incontinence 12/06/2016   Impingement syndrome  of right shoulder 04/17/2015   Fibrocystic breast 01/18/2015   Gastroesophageal reflux disease without esophagitis 01/18/2015   Hypothyroidism, postablative 01/18/2015   Arteriosclerosis of coronary artery 12/29/2014   DJD of shoulder 11/13/2014   Fibromyalgia 10/02/2014   Intercostal neuralgia 10/02/2014   Facet syndrome, lumbar 10/02/2014   Sacroiliac joint disease 10/02/2014   Carotid artery narrowing 07/09/2014   Essential hypertension 07/01/2014   Osteoarthritis of thumb 09/24/2012   Mixed hyperlipidemia 09/22/2012   Arthritis of hand, degenerative 09/22/2012    PCP: Justus Leita DEL, MD  REFERRING PROVIDER: Clois Fret, MD  REFERRING DIAG: M43.16 (ICD-10-CM) - Spondylolisthesis of lumbar region   RATIONALE FOR EVALUATION AND TREATMENT: Rehabilitation  THERAPY DIAG: Chronic bilateral low back pain, unspecified whether sciatica present  Difficulty in walking, not elsewhere classified  Muscle weakness (generalized)  Spinal stenosis of lumbar region with neurogenic claudication  ONSET DATE: Chronic (10-year history of sciatica episodes with recurrent ESI; ESI in Sept 2025 did not alleviate pain, ongoing worsening symptoms)  FOLLOW-UP APPT SCHEDULED WITH REFERRING PROVIDER: Yes ; f/u with neurosurgery in Jan 2026 for lumbar fusion  PERTINENT HISTORY: Pt is a 78 year old female with hx of chronic back pain/lumbar spinal stenosis. Hx of fibromyalgia.    She now has lumbar fusion planned for 03/21/2024. She has experienced significant back pain for the past ten years, initially affecting her left sciatic nerve. Pain management injections were effective until September, but the pain has worsened over the past two months. The pain is centered in her lower back, radiating down both legs and stopping just above ankle, and is exacerbated by walking or standing for prolonged periods. She uses a walking stick for support and wears a brace when active.   Pt has known grade 1  anterolisthesis at L4-5. MRI shows facet osteoarthritis focally advanced at L4-5. Focally advanced disc degeneration at L5-S1 with progression from 2011. Mild foraminal narrowing at this level.  Coughing and sneezing exacerbate symptoms. Patient reports pain with urinating and having bowel movement due to back pain.   PAIN:    Pain Intensity: Present: 4/10, Best: 4/10, Worst: 10/10 Pain location: Across back, lumbosacral level Pain Quality: burning, shooting Radiating: Yes; down LEs and stopping just short of ankles (L worse than R) Numbness/Tingling: No Focal Weakness: No Aggravating factors: standing, bending over, walking Relieving factors: on the move; sitting on recliner/reclined position; heating pad  24-hour pain behavior: significant pain in AM and getting out of bed  How long can you stand: 15-20 min standing History of prior back injury, pain, surgery, or therapy: Yes; No hx of back surgery; chronic back pain with repeated ESIs, surgery planned for January 2026  Imaging: Yes ;  MR Lumbar spine; 05/24/23 Facet osteoarthritis focally advanced  at L4-5 where there is anterolisthesis and advanced spinal stenosis that is progressed from 2011. Focally advanced disc degeneration at L5-S1 with progression from 2011. Mild foraminal narrowing at this level.   Red flags: Negative for bowel/bladder changes, saddle paresthesia, personal history of cancer, h/o spinal tumors, h/o compression fx, h/o abdominal aneurysm, abdominal pain, chills/fever, night sweats, nausea, vomiting, unrelenting pain, first onset of insidious LBP <20 y/o  PRECAUTIONS: None  WEIGHT BEARING RESTRICTIONS: No  FALLS: Has patient fallen in last 6 months? No  Living Environment Lives with: lives alone Lives in: House/apartment; one level inside home Stairs: 1 step into home from garage Has following equipment at home: rubber non-slip mat in shower  Prior level of function: Independent  Occupational  demands: Retired  Presenter, Broadcasting: Cooking, walking exercise routine  Patient Goals: Improve pain and try to manage pain prior to surgery    OBJECTIVE (data from initial evaluation unless otherwise dated):   Patient Surveys  Modified Oswestry: 22/50 = 44%  Cognition Patient is oriented to person, place, and time.  Recent memory is intact.  Remote memory is intact.  Attention span and concentration are intact.  Expressive speech is intact.  Patient's fund of knowledge is within normal limits for educational level.    Gross Musculoskeletal Assessment Tremor: None Bulk: Normal Tone: Normal No visible step-off along spinal column, no signs of scoliosis   GAIT: Distance walked: 40 fit Assistive device utilized: Walking stick, L side Level of assistance: SBA Comments: Guarded posture, L>R pelvic drop with dec trunk rotation, dec arm swing  Posture: Lumbar lordosis: Decreased Increased thoracic kyphosis Iliac crest height: Equal bilaterally Lumbar lateral shift: Negative  AROM AROM (Normal range in degrees) AROM  02/07/2024  Lumbar   Flexion (65) 75%*  Extension (30) 75%*  Right lateral flexion (25) 75%*  Left lateral flexion (25) 75%*  Right rotation (30) WNL  Left rotation (30) 75%*       Hip Right Left  Flexion (125) WNL WNL  Extension (15)    Abduction (40)    Adduction     Internal Rotation (45)    External Rotation (45) WNL WNL      Knee    Flexion (135)    Extension (0)        Ankle    Dorsiflexion (20)    Plantarflexion (50)    Inversion (35)    Eversion (15)    (* = pain; Blank rows = not tested)  LE MMT: MMT (out of 5) Right 02/07/2024 Left 02/07/2024  Hip flexion 4 4-  Hip extension    Hip abduction 4* 4*  Hip adduction    Hip internal rotation    Hip external rotation    Knee flexion 5   Knee extension 4+* 4*  Ankle dorsiflexion 4+ 4-  Ankle plantarflexion    Ankle inversion    Ankle eversion    (* = pain; Blank rows = not  tested)  Sensation Grossly intact to light touch throughout bilateral LEs as determined by testing dermatomes L2-S2. Proprioception, stereognosis, and hot/cold testing deferred on this date.  Reflexes R/L Knee Jerk (L3/4): 2+/2+  Ankle Jerk (S1/2): 2+/2+  Clonus: Negative Hoffman's Negative  Muscle Length Hamstrings: R: Only 10 deg deficit; L: Only 10 deg deficit  Ely (quadriceps): R: Not examined L: Not examined Thomas (hip flexors): R: Not examined L: Not examined   Palpation Location Right Left         Lumbar paraspinals   1 2  Quadratus Lumborum    Gluteus Maximus 1 1  Gluteus Medius 1 1  Deep hip external rotators    PSIS 2 2  Fortin's Area (SIJ)    Greater Trochanter    (Blank rows = not tested) Graded on 0-4 scale (0 = no pain, 1 = pain, 2 = pain with wincing/grimacing/flinching, 3 = pain with withdrawal, 4 = unwilling to allow palpation)  Passive Accessory  Motion Deferred  Special Tests (02/13/24) Lumbar Radiculopathy and Discogenic: Centralization and Peripheralization (SN 92, -LR 0.12):  -Repeated flexion in lying: no effect during, moderate gluteal pain R>L immediately afterward; (minimal pain in lying after rest break) Slump (SN 83, -LR 0.32): R: Negative  L: Positive   SLR (SN 92, -LR 0.29): R: Negative   L: Positive   General lumbar traction in hooklying: No effect  Facet Joint: Extension-Rotation (SN 100, -LR 0.0): R: Positive   L: Positive    Lumbar Foraminal Stenosis: Lumbar quadrant (SN 70): R: Negative   L: Negative    Hip: FABER (SN 81): R: Positive for back pain   L: Positive for back pain       TODAY'S TREATMENT: DATE: 02/29/2024    SUBJECTIVE STATEMENT:   Patient reports strategies attempted have not provided notable relief to date. She reports 8/10 pain at arrival - described as shooting pain along thoracolumbar region presently. She reports more back pain versus leg pain at arrival today. She reports notable leg pain after walking >  10 mins. She states that STM/IASTM felt good last time, but this did not seem to provide relief similar to other strategies attempted to date.     AROM Lumbar flexion 50%* Lumbar extension 75%* Lateral flexion: R WNL, L 50%* Thoracolumbar rotation: R WNL, L WNL   Therapeutic Exercise - for improved soft tissue flexibility and extensibility as needed for ROM, improved strength as needed to improve performance of CKC activities/functional movements  Nustep level 2 (seat 10) x 5 minutes to improve trunk mobility and overall BLE strength, nervous system downregulation  -MHP applied during time on NuStep  Repeated flexion in lying; 1 x 10  -no notable discomfort with flexion to end-range, pain with return to neutral; 2/10 pain in lying following repeated motion  Lower trunk rotations; 1 x 10  Posterior pelvic tilt; 2 x 10, in hooklying  Physioball 3-way rollout, blue ball; x 5 ea dir, 5 sec hold; anterior/anterolateral R/L  *Reviewed log rolling technique to improve tolerance of bed mobility/transfers    *not today* Seated forward trunk flexion 2 x 10  Seated marching 2 x 10 each LE - cues for core activation  Seated marching while seated on green dynadisc 2 x 10 each LE   *Attempted manual traction in hooklying, no relief of symptoms   Neuromuscular Re-education - for nervous system downregulation, desensitization   *not today*  STM and IASTM with Hypervolt along bilat longissimus lumborum L3-S1 and bilateral gluteal musculature; x 25 minutes   Self-care/Home Management - strategies for using appropriate adaptive equipment and at-home devices to improve ADL performance and activity tolerance   Discussed use of TENs at home and provided online resource for TENS unit for acute/short-term pain management for severe episodic flare-ups. Reviewed various resources for bed transfer railings for home and safe use with adjustable frame/hospital bed setup at home.     PATIENT  EDUCATION:  Education details: see above for patient education details Person educated: Patient Education method: Explanation and Demonstration Education comprehension: verbalized understanding  HOME EXERCISE PROGRAM:  Access Code: [email or web address] URL: [email or web address] Date: 02/15/2024 Prepared by: Venetia Endo  Exercises - Supine Double Knee to Chest  - 5-6 x daily - 7 x weekly - 1 sets - 10 reps - 1sec hold - Seated Slump Nerve Glide  - 2 x daily - 7 x weekly - 2 sets - 10 reps - 1sec hold - Supine Lower Trunk Rotation  - 2 x daily - 7 x weekly - 2 sets - 10 reps - 2 sec hold - Supine Piriformis Stretch with Foot on Ground  - 2 x daily - 7 x weekly - 3 sets - 30sec hold   ASSESSMENT:  CLINICAL IMPRESSION:   Patient has ongoing severe pain and intermittent shooting symptoms with grimacing/significant pain behaviors during stepping and transferring. Pt has modest improvement with ROM and tolerance during bilateral trunk rotation and R thoracolumbar lateral flexion. ROM is still markedly limited, and pt has notable symptoms in either direction in sagittal plane. We have discussed strategies to improve tolerance of bed transfers and ADLs as well as strategies for pain management during severe flare-ups. Pt does have limited expectations for conservative management, and this may limit prognosis for physical therapy for lumbar spinal stenosis/chronic back pain in setting of fibromyalgia. Pt has Sx scheduled for 03/21/24 with Dr. Clois, and she will likely need to move forward with this if notable progress is not attained. Pt will continue to benefit from skilled PT services to address deficits and improve function.   OBJECTIVE IMPAIRMENTS: Abnormal gait, difficulty walking, decreased ROM, decreased strength, impaired flexibility, postural dysfunction, and pain.   ACTIVITY LIMITATIONS: carrying, lifting, bending, sitting, sleeping, transfers, and locomotion  level  PARTICIPATION LIMITATIONS: meal prep, driving, shopping, community activity, and church  PERSONAL FACTORS: Age, Past/current experiences, Time since onset of injury/illness/exacerbation, and 3+ comorbidities: (anxiety, CAD, emphysema, fibromyalgia, Grave's disease, RA, OA, HLD, HTN) are also affecting patient's functional outcome.   REHAB POTENTIAL: Fair due to significant comorbidities, chronicity of pain, fibromyalgia, and upcoming planned fusion  CLINICAL DECISION MAKING: Unstable/unpredictable  EVALUATION COMPLEXITY: High   GOALS: Goals reviewed with patient? No  SHORT TERM GOALS: Target date: 02/29/2024  Pt will be independent with HEP in order to improve strength and decrease back pain to improve pain-free function at home and work. Baseline: 02/08/2024: Baseline HEP initiated Goal status: INITIAL   LONG TERM GOALS: Target date: 03/21/2024  Patient will have full thoracolumbar AROM without reproduction of pain as needed for reaching items on ground, household chores, bending. Baseline: 02/08/24: Decreased flexion and extension ROM, pain with extension/concordant sign. Goal status: INITIAL  2.  Pt will decrease worst back pain by at least 2 points on the NPRS in order to demonstrate clinically significant reduction in back pain. Baseline: 111/26/25: 10/10 at worst Goal status: INITIAL  3.  Pt will decrease mODI score by at least 10 points in order demonstrate clinically significant reduction in back pain/disability.       Baseline: 02/08/24:  22/50 = 44% Goal status: INITIAL  4.  Pt will be able to complete community-level ambulation with LRAD without exacerbation of back pain as needed for accessing local community and church/philanthropic events Baseline: 02/08/24: Severe pain with prolonged walking and standing Goal status: INITIAL   PLAN: PT FREQUENCY: 1-2x/week  PT DURATION: 6 weeks  PLANNED INTERVENTIONS: Therapeutic exercises, Therapeutic activity,  Neuromuscular re-education, Balance training, Gait training, Patient/Family education, Self Care, Joint mobilization, Joint manipulation, Vestibular training, Canalith repositioning, Orthotic/Fit training, DME instructions, Dry Needling,  Electrical stimulation, Spinal manipulation, Spinal mobilization, Cryotherapy, Moist heat, Taping, Traction, Ultrasound, Ionotophoresis 4mg /ml Dexamethasone , Manual therapy, and Re-evaluation.  PLAN FOR NEXT SESSION: Follow-up on response with repeated flexion. Manual techniques prn for symptom modulation and consideration of modalities (e-stim, TENS) for severe pain.    Venetia Endo, PT, DPT [phone]  Physical Therapist - [facility]  Venetia ONEIDA Endo, [place] 02/29/2024, 12:12 PM

## 2024-03-05 ENCOUNTER — Ambulatory Visit: Admitting: Physical Therapy

## 2024-03-05 ENCOUNTER — Encounter: Payer: Self-pay | Admitting: Physical Therapy

## 2024-03-05 DIAGNOSIS — M6281 Muscle weakness (generalized): Secondary | ICD-10-CM

## 2024-03-05 DIAGNOSIS — M545 Low back pain, unspecified: Secondary | ICD-10-CM

## 2024-03-05 DIAGNOSIS — M48062 Spinal stenosis, lumbar region with neurogenic claudication: Secondary | ICD-10-CM

## 2024-03-05 DIAGNOSIS — R262 Difficulty in walking, not elsewhere classified: Secondary | ICD-10-CM

## 2024-03-05 NOTE — Therapy (Signed)
 " OUTPATIENT PHYSICAL THERAPY THORACOLUMBAR/LOWER QUARTER TREATMENT   Patient Name: Carrie Myers MRN: [id number] DOB:1945-06-13, 78 y.o., female Today's Date: 03/05/2024   END OF SESSION:  PT End of Session - 03/05/24 1114     Visit Number 6    Number of Visits 13    Date for Recertification  03/22/24    PT Start Time 1120    PT Stop Time 1208    PT Time Calculation (min) 48 min    Activity Tolerance Patient tolerated treatment well    Behavior During Therapy [facility] for tasks assessed/performed           Past Medical History:  Diagnosis Date   Allergy    Anxiety    Arthritis    neck, hands, lower back   Bilateral dry eyes    Chronic low back pain 12/23/2016   Coronary artery disease    DDD (degenerative disc disease), lumbar 10/02/2014   Dental crowns present    Implants, front flipper while waiting for other implants   Emphysema of lung (HCC)    Fibromyalgia    Fibromyalgia    Fracture of right foot 01/14/2015   GERD (gastroesophageal reflux disease)    History of Graves' disease    Hyperlipidemia    Hypertension    Hypothyroidism    Myocardial infarction (HCC) 04/22/2006   Osteoarthritis    Other shoulder lesions, right shoulder 04/17/2015   Rheumatoid arthritis (HCC)    Sciatica, left side 01/01/2019   Sleep apnea    CPAP   Status post shoulder surgery 05/27/2015   Thyroid  disease    Vertigo    none for approx 9 yrs   Past Surgical History:  Procedure Laterality Date   ABDOMINAL HYSTERECTOMY     total   APPENDECTOMY     BREAST BIOPSY Right    neg   BREAST CYST ASPIRATION Left    neg   BREAST EXCISIONAL BIOPSY Right    BUNIONECTOMY Bilateral    CESAREAN SECTION     COLONOSCOPY  2010   normal   COLONOSCOPY WITH PROPOFOL  N/A 04/03/2018   Procedure: COLONOSCOPY WITH BIOPSIES;  Surgeon: Jinny Carmine, MD;  Location: [facility];  Service: Endoscopy;  Laterality: N/A;  sleep apnea   CORONARY ANGIOPLASTY WITH STENT PLACEMENT  04/2006    dental implant     [age]   DILATION AND CURETTAGE OF UTERUS     epidural steroid injection  2018   ESOPHAGOGASTRODUODENOSCOPY N/A 10/03/2020   Procedure: ESOPHAGOGASTRODUODENOSCOPY (EGD);  Surgeon: Maryruth Ole DASEN, MD;  Location: [facility];  Service: Endoscopy;  Laterality: N/A;  REQUESTING MORNING AFTER 9 AM   ESOPHAGOGASTRODUODENOSCOPY (EGD) WITH PROPOFOL  N/A 08/17/2022   Procedure: ESOPHAGOGASTRODUODENOSCOPY (EGD) WITH PROPOFOL ;  Surgeon: Maryruth Ole DASEN, MD;  Location: [facility] END[facility];  Service: Endoscopy;  Laterality: N/A;   EYE SURGERY     bilateral cataract extraction   SHOULDER ARTHROSCOPY Right 05/07/2015   Procedure: Extensive arthroscopic debridement and arthroscopic subacromial decompression, right shoulder.                        2.  Steroid injection right thumb CMC joint.;  Surgeon: Norleen JINNY Maltos, MD;  Location: [facility];  Service: Orthopedics;  Laterality: Right;   TOOTH EXTRACTION     Upper centrals and lateral   Patient Active Problem List   Diagnosis Date Noted   Right foot pain 11/01/2023   Arthralgia of left ankle 10/20/2021   Osteopenia  determined by x-ray 03/31/2021   Primary osteoarthritis of left knee 01/22/2021   Effusion, right knee 12/15/2020   Primary osteoarthritis of right knee 11/24/2020   Overactive bladder 06/18/2020   Prediabetes 04/14/2020   Slow transit constipation 06/05/2019   Sciatica, left side 01/01/2019   Diverticulosis of colon 04/25/2018   Change in bowel habits    Tobacco use disorder, moderate, in sustained remission 12/22/2017   Moderate episode of recurrent major depressive disorder (HCC) 10/03/2017   Inflammatory spondylopathy of lumbar region 10/03/2017   Hoarseness, persistent 04/18/2017   OSA on CPAP 02/18/2017   Cervical disc disease 12/31/2016   Generalized osteoarthritis 12/23/2016   Chronic pain syndrome 12/23/2016   Cystocele, midline 12/06/2016   Urinary incontinence 12/06/2016   Impingement syndrome  of right shoulder 04/17/2015   Fibrocystic breast 01/18/2015   Gastroesophageal reflux disease without esophagitis 01/18/2015   Hypothyroidism, postablative 01/18/2015   Arteriosclerosis of coronary artery 12/29/2014   DJD of shoulder 11/13/2014   Fibromyalgia 10/02/2014   Intercostal neuralgia 10/02/2014   Facet syndrome, lumbar 10/02/2014   Sacroiliac joint disease 10/02/2014   Carotid artery narrowing 07/09/2014   Essential hypertension 07/01/2014   Osteoarthritis of thumb 09/24/2012   Mixed hyperlipidemia 09/22/2012   Arthritis of hand, degenerative 09/22/2012    PCP: Justus Leita DEL, MD  REFERRING PROVIDER: Clois Fret, MD  REFERRING DIAG: M43.16 (ICD-10-CM) - Spondylolisthesis of lumbar region   RATIONALE FOR EVALUATION AND TREATMENT: Rehabilitation  THERAPY DIAG: Chronic bilateral low back pain, unspecified whether sciatica present  Difficulty in walking, not elsewhere classified  Muscle weakness (generalized)  Spinal stenosis of lumbar region with neurogenic claudication  ONSET DATE: Chronic (10-year history of sciatica episodes with recurrent ESI; ESI in Sept 2025 did not alleviate pain, ongoing worsening symptoms)  FOLLOW-UP APPT SCHEDULED WITH REFERRING PROVIDER: Yes ; f/u with neurosurgery in Jan 2026 for lumbar fusion  PERTINENT HISTORY: Pt is a 78 year old female with hx of chronic back pain/lumbar spinal stenosis. Hx of fibromyalgia.    She now has lumbar fusion planned for 03/21/2024. She has experienced significant back pain for the past ten years, initially affecting her left sciatic nerve. Pain management injections were effective until September, but the pain has worsened over the past two months. The pain is centered in her lower back, radiating down both legs and stopping just above ankle, and is exacerbated by walking or standing for prolonged periods. She uses a walking stick for support and wears a brace when active.   Pt has known grade 1  anterolisthesis at L4-5. MRI shows facet osteoarthritis focally advanced at L4-5. Focally advanced disc degeneration at L5-S1 with progression from 2011. Mild foraminal narrowing at this level.  Coughing and sneezing exacerbate symptoms. Patient reports pain with urinating and having bowel movement due to back pain.   PAIN:    Pain Intensity: Present: 4/10, Best: 4/10, Worst: 10/10 Pain location: Across back, lumbosacral level Pain Quality: burning, shooting Radiating: Yes; down LEs and stopping just short of ankles (L worse than R) Numbness/Tingling: No Focal Weakness: No Aggravating factors: standing, bending over, walking Relieving factors: on the move; sitting on recliner/reclined position; heating pad  24-hour pain behavior: significant pain in AM and getting out of bed  How long can you stand: 15-20 min standing History of prior back injury, pain, surgery, or therapy: Yes; No hx of back surgery; chronic back pain with repeated ESIs, surgery planned for January 2026  Imaging: Yes ;  MR Lumbar spine; 05/24/23 Facet osteoarthritis focally  advanced at L4-5 where there is anterolisthesis and advanced spinal stenosis that is progressed from 2011. Focally advanced disc degeneration at L5-S1 with progression from 2011. Mild foraminal narrowing at this level.   Red flags: Negative for bowel/bladder changes, saddle paresthesia, personal history of cancer, h/o spinal tumors, h/o compression fx, h/o abdominal aneurysm, abdominal pain, chills/fever, night sweats, nausea, vomiting, unrelenting pain, first onset of insidious LBP <20 y/o  PRECAUTIONS: None  WEIGHT BEARING RESTRICTIONS: No  FALLS: Has patient fallen in last 6 months? No  Living Environment Lives with: lives alone Lives in: House/apartment; one level inside home Stairs: 1 step into home from garage Has following equipment at home: rubber non-slip mat in shower  Prior level of function: Independent  Occupational  demands: Retired  Presenter, Broadcasting: Cooking, walking exercise routine  Patient Goals: Improve pain and try to manage pain prior to surgery    OBJECTIVE (data from initial evaluation unless otherwise dated):   Patient Surveys  Modified Oswestry: 22/50 = 44%  Cognition Patient is oriented to person, place, and time.  Recent memory is intact.  Remote memory is intact.  Attention span and concentration are intact.  Expressive speech is intact.  Patient's fund of knowledge is within normal limits for educational level.    Gross Musculoskeletal Assessment Tremor: None Bulk: Normal Tone: Normal No visible step-off along spinal column, no signs of scoliosis   GAIT: Distance walked: 40 fit Assistive device utilized: Walking stick, L side Level of assistance: SBA Comments: Guarded posture, L>R pelvic drop with dec trunk rotation, dec arm swing  Posture: Lumbar lordosis: Decreased Increased thoracic kyphosis Iliac crest height: Equal bilaterally Lumbar lateral shift: Negative  AROM AROM (Normal range in degrees) AROM  02/07/2024  Lumbar   Flexion (65) 75%*  Extension (30) 75%*  Right lateral flexion (25) 75%*  Left lateral flexion (25) 75%*  Right rotation (30) WNL  Left rotation (30) 75%*       Hip Right Left  Flexion (125) WNL WNL  Extension (15)    Abduction (40)    Adduction     Internal Rotation (45)    External Rotation (45) WNL WNL      Knee    Flexion (135)    Extension (0)        Ankle    Dorsiflexion (20)    Plantarflexion (50)    Inversion (35)    Eversion (15)    (* = pain; Blank rows = not tested)  LE MMT: MMT (out of 5) Right 02/07/2024 Left 02/07/2024  Hip flexion 4 4-  Hip extension    Hip abduction 4* 4*  Hip adduction    Hip internal rotation    Hip external rotation    Knee flexion 5   Knee extension 4+* 4*  Ankle dorsiflexion 4+ 4-  Ankle plantarflexion    Ankle inversion    Ankle eversion    (* = pain; Blank rows = not  tested)  Sensation Grossly intact to light touch throughout bilateral LEs as determined by testing dermatomes L2-S2. Proprioception, stereognosis, and hot/cold testing deferred on this date.  Reflexes R/L Knee Jerk (L3/4): 2+/2+  Ankle Jerk (S1/2): 2+/2+  Clonus: Negative Hoffman's Negative  Muscle Length Hamstrings: R: Only 10 deg deficit; L: Only 10 deg deficit  Ely (quadriceps): R: Not examined L: Not examined Thomas (hip flexors): R: Not examined L: Not examined   Palpation Location Right Left         Lumbar paraspinals   1  2  Quadratus Lumborum    Gluteus Maximus 1 1  Gluteus Medius 1 1  Deep hip external rotators    PSIS 2 2  Fortin's Area (SIJ)    Greater Trochanter    (Blank rows = not tested) Graded on 0-4 scale (0 = no pain, 1 = pain, 2 = pain with wincing/grimacing/flinching, 3 = pain with withdrawal, 4 = unwilling to allow palpation)  Passive Accessory  Motion Deferred  Special Tests (02/13/24) Lumbar Radiculopathy and Discogenic: Centralization and Peripheralization (SN 92, -LR 0.12):  -Repeated flexion in lying: no effect during, moderate gluteal pain R>L immediately afterward; (minimal pain in lying after rest break) Slump (SN 83, -LR 0.32): R: Negative  L: Positive   SLR (SN 92, -LR 0.29): R: Negative   L: Positive   General lumbar traction in hooklying: No effect  Facet Joint: Extension-Rotation (SN 100, -LR 0.0): R: Positive   L: Positive    Lumbar Foraminal Stenosis: Lumbar quadrant (SN 70): R: Negative   L: Negative    Hip: FABER (SN 81): R: Positive for back pain   L: Positive for back pain       TODAY'S TREATMENT: DATE: 03/05/2024    SUBJECTIVE STATEMENT:   Patient reports minimal pain at arrival today; she is having localized pain across low back without LE referred symptoms this AM. Patient reports having some days better than others - her baseline symptoms are relatively mild this AM.    AROM Lumbar flexion 75%* Lumbar extension  75%* (pain R buttock down RLE) Lateral flexion: R WNL*, L WNL Thoracolumbar rotation: R WNL, L 75%    Therapeutic Exercise - for improved soft tissue flexibility and extensibility as needed for ROM, improved strength as needed to improve performance of CKC activities/functional movements  Nustep level 3 (seat 10) x 5 minutes to improve trunk mobility and overall BLE strength, nervous system downregulation  -MHP applied along low back during time on NuStep  Repeated flexion in lying, with clinician overpressure; 1 x 10  -no notable discomfort with flexion to end-range, pain with return to neutral; 2/10 pain  Lower trunk rotations; 1 x 10  Supine active HS/sciatic flossing; 2 x 10, 1 sec  Upper trunk rotations with dowel; 1 x 10 alt R/L  Posterior pelvic tilt; 2 x 10, in hooklying     *not today* Physioball 3-way rollout, blue ball; x 5 ea dir, 5 sec hold; anterior/anterolateral R/L Seated forward trunk flexion 2 x 10  Seated marching 2 x 10 each LE - cues for core activation  Seated marching while seated on green dynadisc 2 x 10 each LE   Neuromuscular Re-education - for nervous system downregulation, desensitization   *not today*  STM and IASTM with Hypervolt along bilat longissimus lumborum L3-S1 and bilateral gluteal musculature; x 25 minutes   Post-treatment  MHP (unbilled) utilized post-treatment along low back for analgesic effect and improved soft tissue extensibility; x 5 min  PATIENT EDUCATION:  Education details: see above for patient education details Person educated: Patient Education method: Medical Illustrator Education comprehension: verbalized understanding   HOME EXERCISE PROGRAM:  Access Code: [email or web address] URL: [email or web address] Date: 03/05/2024 Prepared by: Venetia Endo  Exercises - Supine Double Knee to Chest  - 5-6 x daily - 7 x weekly - 1 sets - 10 reps - 1sec hold - Supine Lower Trunk Rotation  - 2 x daily - 7 x  weekly - 2 sets - 10 reps - 2 sec hold - Hooklying Hamstring  Stretch  - 2 x daily - 7 x weekly - 2 sets - 10 reps - 1sec hold - Supine Piriformis Stretch with Foot on Ground  - 2 x daily - 7 x weekly - 3 sets - 30sec hold - Supine Posterior Pelvic Tilt  - 2 x daily - 7 x weekly - 2 sets - 10 reps   ASSESSMENT:  CLINICAL IMPRESSION:   Patient arrives with mild symptoms at baseline; minimal LE referred pain but continuous pain across low back. Pt has overlay of known DDD with fibromyalgia and now symptomatic spinal stenosis that notably limits tolerance of walking, standing, and bending. We attempted progression of repeated flexion with no notable symptoms reported in lying. Pt unfortunately still is notably debilitated with transferring/bed mobility tasks. Her program was updated today to include supine neural/dural flossing and posterior pelvic tilt. Pt does have limited expectations for conservative management, and this may limit prognosis for physical therapy for lumbar spinal stenosis/chronic back pain in setting of fibromyalgia. Pt has Sx scheduled for 03/21/24 with Dr. Clois, and she will likely need to move forward with this if notable progress is not attained. Pt will continue to benefit from skilled PT services to address deficits and improve function.   OBJECTIVE IMPAIRMENTS: Abnormal gait, difficulty walking, decreased ROM, decreased strength, impaired flexibility, postural dysfunction, and pain.   ACTIVITY LIMITATIONS: carrying, lifting, bending, sitting, sleeping, transfers, and locomotion level  PARTICIPATION LIMITATIONS: meal prep, driving, shopping, community activity, and church  PERSONAL FACTORS: Age, Past/current experiences, Time since onset of injury/illness/exacerbation, and 3+ comorbidities: (anxiety, CAD, emphysema, fibromyalgia, Grave's disease, RA, OA, HLD, HTN) are also affecting patient's functional outcome.   REHAB POTENTIAL: Fair due to significant comorbidities,  chronicity of pain, fibromyalgia, and upcoming planned fusion  CLINICAL DECISION MAKING: Unstable/unpredictable  EVALUATION COMPLEXITY: High   GOALS: Goals reviewed with patient? No  SHORT TERM GOALS: Target date: 02/29/2024  Pt will be independent with HEP in order to improve strength and decrease back pain to improve pain-free function at home and work. Baseline: 02/08/2024: Baseline HEP initiated Goal status: INITIAL   LONG TERM GOALS: Target date: 03/21/2024  Patient will have full thoracolumbar AROM without reproduction of pain as needed for reaching items on ground, household chores, bending. Baseline: 02/08/24: Decreased flexion and extension ROM, pain with extension/concordant sign. Goal status: INITIAL  2.  Pt will decrease worst back pain by at least 2 points on the NPRS in order to demonstrate clinically significant reduction in back pain. Baseline: 111/26/25: 10/10 at worst Goal status: INITIAL  3.  Pt will decrease mODI score by at least 10 points in order demonstrate clinically significant reduction in back pain/disability.       Baseline: 02/08/24:  22/50 = 44% Goal status: INITIAL  4.  Pt will be able to complete community-level ambulation with LRAD without exacerbation of back pain as needed for accessing local community and church/philanthropic events Baseline: 02/08/24: Severe pain with prolonged walking and standing Goal status: INITIAL   PLAN: PT FREQUENCY: 1-2x/week  PT DURATION: 6 weeks  PLANNED INTERVENTIONS: Therapeutic exercises, Therapeutic activity, Neuromuscular re-education, Balance training, Gait training, Patient/Family education, Self Care, Joint mobilization, Joint manipulation, Vestibular training, Canalith repositioning, Orthotic/Fit training, DME instructions, Dry Needling, Electrical stimulation, Spinal manipulation, Spinal mobilization, Cryotherapy, Moist heat, Taping, Traction, Ultrasound, Ionotophoresis 4mg /ml Dexamethasone , Manual  therapy, and Re-evaluation.  PLAN FOR NEXT SESSION: Follow-up on response with repeated flexion and continue with neurodynamics and graded movement as tolerated. Continue with trunk isometrics and neutral-spine  strengthening as tolerated.   Venetia Endo, PT, DPT [phone]  Physical Therapist - [facility]  Venetia ONEIDA Endo, Arrey 03/05/2024, 2:16 PM  "

## 2024-03-06 ENCOUNTER — Other Ambulatory Visit: Payer: Self-pay | Admitting: Internal Medicine

## 2024-03-06 DIAGNOSIS — M797 Fibromyalgia: Secondary | ICD-10-CM

## 2024-03-07 NOTE — Telephone Encounter (Signed)
 Requested medication (s) are due for refill today: Yes  Requested medication (s) are on the active medication list: Yes  Last refill:  01/05/24  Future visit scheduled: Yes  Notes to clinic:  Unable to refill per protocol, cannot delegate.      Requested Prescriptions  Pending Prescriptions Disp Refills   Diclofenac -miSOPROStol  75-0.2 MG TBEC [Pharmacy Med Name: DICLOFENAC  SODIUM/MISOPROSTOL  75-0.2MG  TABLET DR] 60 tablet 1    Sig: TAKE (1) TABLET BY MOUTH TWICE DAILY     Not Delegated - Analgesics:  Antirheumatic Agents - diclofenac /misoprostol  Failed - 03/07/2024 12:27 PM      Failed - This refill cannot be delegated      Passed - Cr in normal range and within 360 days    Creatinine, Ser  Date Value Ref Range Status  05/18/2023 0.93 0.57 - 1.00 mg/dL Final         Passed - HGB in normal range and within 360 days    Hemoglobin  Date Value Ref Range Status  05/18/2023 12.6 11.1 - 15.9 g/dL Final         Passed - Patient is not pregnant      Passed - Valid encounter within last 12 months    Recent Outpatient Visits           3 months ago Essential hypertension   [facility], Leita DEL, MD   4 months ago Right foot pain   [facility] [facility], Selinda PARAS, MD   9 months ago Essential hypertension   [facility] at [facility], Leita DEL, MD

## 2024-03-09 MED ORDER — DICLOFENAC SODIUM 75 MG PO TBEC: 75.0000 mg | DELAYED_RELEASE_TABLET | Freq: Two times a day (BID) | ORAL | 0 refills | Status: DC

## 2024-03-12 ENCOUNTER — Other Ambulatory Visit: Payer: Self-pay

## 2024-03-12 ENCOUNTER — Other Ambulatory Visit

## 2024-03-12 ENCOUNTER — Encounter
Admission: RE | Admit: 2024-03-12 | Discharge: 2024-03-12 | Disposition: A | Discharge status: Home / Self Care | Source: Ambulatory Visit | Attending: Neurosurgery | Admitting: Neurosurgery

## 2024-03-12 VITALS — BP 132/80 | HR 95 | Resp 16 | Ht 68.0 in | Wt 183.0 lb

## 2024-03-12 DIAGNOSIS — R7303 Prediabetes: Secondary | ICD-10-CM | POA: Insufficient documentation

## 2024-03-12 DIAGNOSIS — M19041 Primary osteoarthritis, right hand: Secondary | ICD-10-CM | POA: Insufficient documentation

## 2024-03-12 DIAGNOSIS — G4733 Obstructive sleep apnea (adult) (pediatric): Secondary | ICD-10-CM | POA: Insufficient documentation

## 2024-03-12 DIAGNOSIS — E782 Mixed hyperlipidemia: Secondary | ICD-10-CM | POA: Insufficient documentation

## 2024-03-12 DIAGNOSIS — I2510 Atherosclerotic heart disease of native coronary artery without angina pectoris: Secondary | ICD-10-CM | POA: Diagnosis not present

## 2024-03-12 DIAGNOSIS — Z01818 Encounter for other preprocedural examination: Secondary | ICD-10-CM

## 2024-03-12 DIAGNOSIS — M19042 Primary osteoarthritis, left hand: Secondary | ICD-10-CM | POA: Insufficient documentation

## 2024-03-12 DIAGNOSIS — Z01812 Encounter for preprocedural laboratory examination: Secondary | ICD-10-CM | POA: Diagnosis present

## 2024-03-12 DIAGNOSIS — I10 Essential (primary) hypertension: Secondary | ICD-10-CM | POA: Insufficient documentation

## 2024-03-12 DIAGNOSIS — M549 Dorsalgia, unspecified: Secondary | ICD-10-CM | POA: Insufficient documentation

## 2024-03-12 DIAGNOSIS — M4800 Spinal stenosis, site unspecified: Secondary | ICD-10-CM | POA: Diagnosis not present

## 2024-03-12 HISTORY — DX: Prediabetes: R73.03

## 2024-03-12 LAB — TYPE AND SCREEN
ABO/RH(D): B POS
Antibody Screen: NEGATIVE

## 2024-03-12 LAB — URINALYSIS, COMPLETE (UACMP) WITH MICROSCOPIC
Bacteria, UA: NONE SEEN
Bilirubin Urine: NEGATIVE
Glucose, UA: NEGATIVE mg/dL
Hgb urine dipstick: NEGATIVE
Ketones, ur: NEGATIVE mg/dL
Nitrite: NEGATIVE
Protein, ur: NEGATIVE mg/dL
Specific Gravity, Urine: 1.017 (ref 1.005–1.030)
pH: 5 (ref 5.0–8.0)

## 2024-03-12 LAB — CBC
HCT: 38.6 % (ref 36.0–46.0)
Hemoglobin: 12.2 g/dL (ref 12.0–15.0)
MCH: 28.8 pg (ref 26.0–34.0)
MCHC: 31.6 g/dL (ref 30.0–36.0)
MCV: 91 fL (ref 80.0–100.0)
Platelets: 285 K/uL (ref 150–400)
RBC: 4.24 MIL/uL (ref 3.87–5.11)
RDW: 13.9 % (ref 11.5–15.5)
WBC: 5.3 K/uL (ref 4.0–10.5)
nRBC: 0 % (ref 0.0–0.2)

## 2024-03-12 LAB — BASIC METABOLIC PANEL WITH GFR
Anion gap: 12 (ref 5–15)
BUN: 25 mg/dL — ABNORMAL HIGH (ref 8–23)
CO2: 28 mmol/L (ref 22–32)
Calcium: 9.5 mg/dL (ref 8.9–10.3)
Chloride: 100 mmol/L (ref 98–111)
Creatinine, Ser: 0.89 mg/dL (ref 0.44–1.00)
GFR, Estimated: >60 mL/min
Glucose, Bld: 80 mg/dL (ref 70–99)
Potassium: 4.6 mmol/L (ref 3.5–5.1)
Sodium: 140 mmol/L (ref 135–145)

## 2024-03-12 LAB — SURGICAL PCR SCREEN
MRSA, PCR: NEGATIVE
Staphylococcus aureus: POSITIVE — AB

## 2024-03-12 NOTE — Patient Instructions (Addendum)
 Your procedure is scheduled on: Wednesday 03/21/24 To find out your arrival time, please call [phone] between 1PM - 3PM on: Tuesday 03/20/24 Report to the Registration Desk on the 1st floor of the [facility]. If your arrival time is 6:00 am, do not arrive before that time as the [facility] entrance doors do not open until 6:00 am.  REMEMBER: Instructions that are not followed completely may result in serious medical risk, up to and including death; or upon the discretion of your surgeon and anesthesiologist your surgery may need to be rescheduled.  Do not eat food after midnight the night before surgery.  No gum chewing or hard candies.  You may however, drink CLEAR liquids up to 2 hours before you are scheduled to arrive for your surgery. Do not drink anything within 2 hours of your scheduled arrival time.  Clear liquids include: - water   - apple juice without pulp - gatorade (not RED colors) - black coffee or tea (Do NOT add milk or creamers to the coffee or tea) Do NOT drink anything that is not on this list.  One week prior to surgery: Stop Anti-inflammatories (NSAIDS) such as Advil, Aleve, Ibuprofen, Motrin, Naproxen, Naprosyn and Aspirin based products such as Excedrin, Goody's Powder, BC Powder.  You may however, continue to take Tylenol  if needed for pain up until the day of surgery.  Stop ANY OVER THE COUNTER supplements and vitamins for at least 7 days until after surgery.  **Follow recommendations regarding stopping blood thinners.** No Aspirin on the day of surgery. (If you take at night skip that dose)  Continue taking all of your other prescription medications up until the day of surgery.  ON THE DAY OF SURGERY ONLY TAKE THESE MEDICATIONS WITH SIPS OF WATER :  cetirizine (ZYRTEC) 10 MG tablet  DULoxetine  (CYMBALTA ) 60 MG capsule  levothyroxine (SYNTHROID) 88 MCG tablet   No Alcohol for 24 hours before or after surgery.  No Smoking including e-cigarettes for  24 hours before surgery.  No chewable tobacco products for at least 6 hours before surgery.  No nicotine patches on the day of surgery.  Do not use any recreational drugs for at least a week (preferably 2 weeks) before your surgery.  Please be advised that the combination of cocaine and anesthesia may have negative outcomes, up to and including death. If you test positive for cocaine, your surgery will be cancelled.  On the morning of surgery brush your teeth with toothpaste and water , you may rinse your mouth with mouthwash if you wish. Do not swallow any toothpaste or mouthwash.  Use CHG Soap or wipes as directed on instruction sheet. (You can pick this up at our office in the [facility], the building to the left of the [employer], [address] at [address]  Do not shave body hair from the neck down 48 hours before surgery.  Do not wear lotions, powders, or perfumes.   Wear comfortable clothing (specific to your surgery type) to the hospital.  Do not wear jewelry, make-up, hairpins, clips or nail polish.  For welded (permanent) jewelry: bracelets, anklets, waist bands, etc.  Please have this removed prior to surgery.  If it is not removed, there is a chance that hospital personnel will need to cut it off on the day of surgery.  Contact lenses, hearing aids and dentures may not be worn into surgery.  Do not bring valuables to the hospital. [facility] is not responsible  for any missing/lost belongings or valuables.   Bring your C-PAP to the hospital in case you may have to spend the night.   Notify your doctor if there is any change in your medical condition (cold, fever, infection).  After surgery, you can help prevent lung complications by doing breathing exercises.  Take deep breaths and cough every 1-2 hours. Your doctor may order a device called an Incentive Spirometer to help you take deep breaths.  When coughing or sneezing, hold a pillow  firmly against your incision with both hands. This is called splinting. Doing this helps protect your incision. It also decreases belly discomfort.  If you are being admitted to the hospital overnight, leave your suitcase in the car. After surgery it may be brought to your room.  In case of increased patient census, it may be necessary for you, the patient, to continue your postoperative care in the Same Day Surgery department.  Please call the Pre-admissions Testing Dept. at [phone] if you have any questions about these instructions.  Surgery Visitation Policy:  Patients having surgery or a procedure may have two visitors.  Children under the age of 34 must have an adult with them who is not the patient.  Inpatient Visitation:    Visiting hours are 7 a.m. to 8 p.m. Up to four visitors are allowed at one time in a patient room. The visitors may rotate out with other people during the day.  One visitor age 4 or older may stay with the patient overnight and must be in the room by 8 p.m.   Merchandiser, Retail to address health-related social needs:  [email or web address]    Pre-operative 4 CHG Bath Instructions   You can play a key role in reducing the risk of infection after surgery. Your skin needs to be as free of germs as possible. You can reduce the number of germs on your skin by washing with CHG (chlorhexidine gluconate) soap before surgery. CHG is an antiseptic soap that kills germs and continues to kill germs even after washing.   DO NOT use if you have an allergy to chlorhexidine/CHG or antibacterial soaps. If your skin becomes reddened or irritated, stop using the CHG and notify one of our RNs at [phone].   Please shower with the CHG soap starting 4 days before surgery using the following schedule:   Saturday 03/17/24 - Tuesday 03/20/24    Please keep in mind the following:  DO NOT shave, including legs and underarms, starting the day of  your first shower.   You may shave your face at any point before/day of surgery.  Place clean sheets on your bed the day you start using CHG soap. Use a clean washcloth (not used since being washed) for each shower. DO NOT sleep with pets once you start using the CHG.   CHG Shower Instructions:  If you choose to wash your hair and private area, wash first with your normal shampoo/soap.  After you use shampoo/soap, rinse your hair and body thoroughly to remove shampoo/soap residue.  Turn the water  OFF and apply about 3 tablespoons (45 ml) of CHG soap to a CLEAN washcloth.  Apply CHG soap ONLY FROM YOUR NECK DOWN TO YOUR TOES (washing for 3-5 minutes)  DO NOT use CHG soap on face, private areas, open wounds, or sores.  Pay special attention to the area where your surgery is being performed.  If you are having back surgery, having someone wash your back for you may  be helpful. Wait 2 minutes after CHG soap is applied, then you may rinse off the CHG soap.  Pat dry with a clean towel  Put on clean clothes/pajamas   If you choose to wear lotion, please use ONLY the CHG-compatible lotions on the back of this paper.     Additional instructions for the day of surgery: DO NOT APPLY any lotions, deodorants, cologne, or perfumes.   Put on clean/comfortable clothes.  Brush your teeth.  Ask your nurse before applying any prescription medications to the skin.      CHG Compatible Lotions   Aveeno Moisturizing lotion  Cetaphil Moisturizing Cream  Cetaphil Moisturizing Lotion  Clairol Herbal Essence Moisturizing Lotion, Dry Skin  Clairol Herbal Essence Moisturizing Lotion, Extra Dry Skin  Clairol Herbal Essence Moisturizing Lotion, Normal Skin  Curel Age Defying Therapeutic Moisturizing Lotion with Alpha Hydroxy  Curel Extreme Care Body Lotion  Curel Soothing Hands Moisturizing Hand Lotion  Curel Therapeutic Moisturizing Cream, Fragrance-Free  Curel Therapeutic Moisturizing Lotion,  Fragrance-Free  Curel Therapeutic Moisturizing Lotion, Original Formula  Eucerin Daily Replenishing Lotion  Eucerin Dry Skin Therapy Plus Alpha Hydroxy Crme  Eucerin Dry Skin Therapy Plus Alpha Hydroxy Lotion  Eucerin Original Crme  Eucerin Original Lotion  Eucerin Plus Crme Eucerin Plus Lotion  Eucerin TriLipid Replenishing Lotion  Keri Anti-Bacterial Hand Lotion  Keri Deep Conditioning Original Lotion Dry Skin Formula Softly Scented  Keri Deep Conditioning Original Lotion, Fragrance Free Sensitive Skin Formula  Keri Lotion Fast Absorbing Fragrance Free Sensitive Skin Formula  Keri Lotion Fast Absorbing Softly Scented Dry Skin Formula  Keri Original Lotion  Keri Skin Renewal Lotion Keri Silky Smooth Lotion  Keri Silky Smooth Sensitive Skin Lotion  Nivea Body Creamy Conditioning Oil  Nivea Body Extra Enriched Lotion  Nivea Body Original Lotion  Nivea Body Sheer Moisturizing Lotion Nivea Crme  Nivea Skin Firming Lotion  NutraDerm 30 Skin Lotion  NutraDerm Skin Lotion  NutraDerm Therapeutic Skin Cream  NutraDerm Therapeutic Skin Lotion  ProShield Protective Hand Cream  Provon moisturizing lotion  How to Use an Incentive Spirometer  An incentive spirometer is a tool that measures how well you are filling your lungs with each breath. Learning to take long, deep breaths using this tool can help you keep your lungs clear and active. This may help to reverse or lessen your chance of developing breathing (pulmonary) problems, especially infection. You may be asked to use a spirometer: After a surgery. If you have a lung problem or a history of smoking. After a long period of time when you have been unable to move or be active. If the spirometer includes an indicator to show the highest number that you have reached, your health care provider or respiratory therapist will help you set a goal. Keep a log of your progress as told by your health care provider. What are the  risks? Breathing too quickly may cause dizziness or cause you to pass out. Take your time so you do not get dizzy or light-headed. If you are in pain, you may need to take pain medicine before doing incentive spirometry. It is harder to take a deep breath if you are having pain.  Sit up on the edge of your bed or on a chair. Hold the incentive spirometer so that it is in an upright position. Before you use the spirometer, breathe out normally. Place the mouthpiece in your mouth. Make sure your lips are closed tightly around it. Breathe in slowly and as deeply as  you can through your mouth, causing the piston or the ball to rise toward the top of the chamber. Hold your breath for 3-5 seconds, or for as long as possible. If the spirometer includes a coach indicator, use this to guide you in breathing. Slow down your breathing if the indicator goes above the marked areas. Remove the mouthpiece from your mouth and breathe out normally. The piston or ball will return to the bottom of the chamber. Rest for a few seconds, then repeat the steps 10 or more times. Take your time and take a few normal breaths between deep breaths so that you do not get dizzy or light-headed. Do this every 1-2 hours when you are awake. If the spirometer includes a goal marker to show the highest number you have reached (best effort), use this as a goal to work toward during each repetition. After each set of 10 deep breaths, cough a few times. This will help to make sure that your lungs are clear. If you have an incision on your chest or abdomen from surgery, place a pillow or a rolled-up towel firmly against the incision when you cough. This can help to reduce pain while taking deep breaths and coughing. General tips When you are able to get out of bed: Walk around often. Continue to take deep breaths and cough in order to clear your lungs. Keep using the incentive spirometer until your health care provider says it is  okay to stop using it. If you have been in the hospital, you may be told to keep using the spirometer at home. Contact a health care provider if: You are having difficulty using the spirometer. You have trouble using the spirometer as often as instructed. Your pain medicine is not giving enough relief for you to use the spirometer as told. You have a fever. Get help right away if: You develop shortness of breath. You develop a cough with bloody mucus from the lungs. You have fluid or blood coming from an incision site after you cough. Summary An incentive spirometer is a tool that can help you learn to take long, deep breaths to keep your lungs clear and active. You may be asked to use a spirometer after a surgery, if you have a lung problem or a history of smoking, or if you have been inactive for a long period of time. Use your incentive spirometer as instructed every 1-2 hours while you are awake. If you have an incision on your chest or abdomen, place a pillow or a rolled-up towel firmly against your incision when you cough. This will help to reduce pain. Get help right away if you have shortness of breath, you cough up bloody mucus, or blood comes from your incision when you cough. This information is not intended to replace advice given to you by your health care provider. Make sure you discuss any questions you have with your health care provider. Document Revised: 05/21/2019 Document Reviewed: 05/21/2019 Elsevier Patient Education  2023 [employer].

## 2024-03-12 NOTE — Pre-Procedure Instructions (Signed)
 Pt states that sometimes with anesthesia one of her pupils can become dilated.

## 2024-03-12 NOTE — Progress Notes (Addendum)
 Cardiac Clearance from Dr Carrie:  Carrie Keller Grist, MD - 02/29/2024 10:30 AM EST Formatting of this note is different from the original. Established Patient Visit  Chief Complaint: Preop evaluation for spinal stenosis Chief Complaint Patient presents with Pre-op Exam Date of Service: 02/29/2024 Date of Birth: 11/30/1945 PCP: Justus Leita Mettle, MD History of Present Illness: [name] is a 78 y.o.female patient who presented to our clinic for preop evaluation for spinal stenosis  Past medical history significant for CAD status post PCI of LAD 2008 following NSTEMI, hypertension, hyperlipidemia, spinal stenosis, fibromyalgia. Echocardiogram 02/2021 with normal biventricular systolic function and no significant valvular abnormality. Stress test 02/2021 normal with no ischemia. LDL 64 in 2025.  Today she came to office walking with a cane. Detailed that she is having a lot of issues from spinal stenosis and scheduled for surgery next month. 2 to 3 months ago she used to be able to walk for 1 mile daily without any anginal symptoms. Currently limited due to back pain issues. Otherwise no chest pain/pressure, shortness of breath, palpitation, dizziness or syncope. EKG today showed sinus rhythm without any ischemic changes.  Past Medical and Surgical History Past Medical History Past Medical History: Diagnosis Date Acute myocardial infarction of anterolateral wall, initial episode of care (CMS/HHS-HCC) Coronary artery disease Degenerative arthritis Encounter for blood transfusion Fibromyalgia GERD (gastroesophageal reflux disease) History of chickenpox History of Graves' disease History of motion sickness History of spinal stenosis Hyperlipidemia Hypertension Hypothyroidism Myocardial infarction (CMS/HHS-HCC) Sleep apnea cpap machine Vertigo one episode  Past Surgical History She has a past surgical history that includes PCI and stent placement LAD;  Hysterectomy; D & C x3; Appendectomy; Left ovary removed; Right ovary removed; Cysts removed from right breast x2; Bilateral foot surgery; Cardiac cath; extensive arthroscopic debridment and arthroscopic subacromial decompression, right (Right, 05/07/2015); steroid injection right thumb CMC joint (Right, 05/07/2015); extraction cataract extracapsular w/insertion intraocular prosthesis (Right, 05/23/2017); extraction cataract extracapsular w/insertion intraocular prosthesis (Left, 06/06/2017); egd (10/03/2020); R. knee fluid removal; and EGD @ [facility] (08/17/2022).  Medications and Allergies Current Medications  Current Outpatient Medications Medication Sig Dispense Refill armodafiniL (NUVIGIL) 150 mg tablet TAKE (1) TABLET BY MOUTH EVERY DAY 90 tablet 3 aspirin 81 MG EC tablet Take 81 mg by mouth once daily. celecoxib  (CELEBREX ) 200 MG capsule Take 200 mg by mouth once daily cetirizine (ZYRTEC) 10 MG tablet Take 10 mg by mouth once daily citalopram  (CELEXA ) 20 MG tablet Take 20 mg by mouth once daily clobetasoL  (TEMOVATE ) 0.05 % ointment Apply pea size amount 1-2 times weekly as maintenance diazepam  (VALIUM ) 5 MG tablet Take 5 mg by mouth at bedtime Reported on 06/25/2015 3 diclofenac  (VOLTAREN ) 75 MG EC tablet Take 75 mg by mouth 2 (two) times daily diclofenac -misoprostoL  (ARTHROTEC 75) 75-200 mg-mcg DR tablet Take 1 tablet by mouth 2 (two) times daily diphenhydrAMINE-acetaminophen  (TYLENOL  PM) 25-500 mg per tablet Take 1 tablet by mouth at bedtime as needed DULoxetine  (CYMBALTA ) 30 MG DR capsule Take 30 mg by mouth as needed DULoxetine  (CYMBALTA ) 60 MG DR capsule Take 1 capsule by mouth as needed fluticasone propionate (FLONASE) 50 mcg/actuation nasal spray HYDROcodone -acetaminophen  (NORCO) 5-325 mg tablet Take 1-2 tablets by mouth every 6 (six) hours as needed for Pain. Can take up to 2 tabs every 6 hours for pain. (Patient taking differently: Take 1 tablet by mouth once daily) 30 tablet  0 levocetirizine (XYZAL) 5 MG tablet Take 5 mg by mouth every evening levothyroxine (SYNTHROID, LEVOTHROID) 88 MCG tablet Take 88 mcg by  mouth once daily loperamide (IMODIUM A-D) 2 mg tablet Take by mouth 4 (four) times daily as needed losartan (COZAAR) 100 MG tablet TAKE (1) TABLET BY MOUTH EVERY DAY 90 tablet 1 multivitamin tablet Take 1 tablet by mouth once daily.  MYRBETRIQ  50 mg ER tablet Take 1 tablet by mouth once daily ofloxacin (OCUFLOX) 0.3 % ophthalmic solution Place 1 drop into the right eye 4 (four) times daily Please begin after procedure. 5 mL 0 ofloxacin (OCUFLOX) 0.3 % ophthalmic solution PLACE ONE (1) DROP INTO THE LEFT EYE FOUR (4) TIMES DAILY (BEGIN AFTER SCHEDULED PROCEDURE) 5 mL 0 pantoprazole (PROTONIX) 40 MG DR tablet Take 1 tablet (40 mg total) by mouth 2 (two) times daily before meals Take 30 min before meals 180 tablet 1 pravastatin (PRAVACHOL) 80 MG tablet TAKE 1 TABLET BY MOUTH DAILY 90 tablet 3 prednisoLONE acetate (PRED FORTE) 1 % ophthalmic suspension Place 1 drop into the right eye 3 (three) times daily prednisoLONE acetate (PRED FORTE) 1 % ophthalmic suspension Place 1 drop into the right eye 4 (four) times daily Please begin after procedure. 5 mL 1 prednisoLONE acetate (PRED FORTE) 1 % ophthalmic suspension Place 1 drop into the left eye 4 (four) times daily 10 mL 3 predniSONE  (DELTASONE ) 20 MG tablet Take 1 tablet (20 mg total) by mouth once daily 5 tablet 0 pregabalin  (LYRICA ) 50 MG capsule Take 50 mg by mouth as needed sucralfate  (CARAFATE ) 100 mg/mL suspension Take 10 mLs (1 g total) by mouth 3 (three) times daily as needed 1200 mL 6 tolterodine  (DETROL  LA) 4 MG LA capsule Take 4 mg by mouth once daily 11 albuterol  MDI, PROVENTIL , VENTOLIN , PROAIR , HFA 90 mcg/actuation inhaler Inhale 2 inhalations into the lungs every 6 (six) hours as needed for Wheezing for up to 90 days 3 each 2  No current facility-administered medications for this visit.  Allergies:  Amoxicillin-pot clavulanate, Codeine, Detrol  [tolterodine ], Proparacaine, Sulfa (sulfonamide antibiotics), Codeine phosphate, and Shellfish containing products  Social and Family History Social History reports that she has quit smoking. She has never used smokeless tobacco. She reports current alcohol use. She reports that she does not use drugs.  Family History Family History Problem Relation Name Age of Onset High blood pressure (Hypertension) Mother Stroke Mother High blood pressure (Hypertension) Father Myocardial Infarction (Heart attack) Father Arthritis Father Seizures Brother Thyroid  disease Other sibling Glaucoma Neg Hx Macular degeneration Neg Hx  Review of Systems  Review of Systems: The patient denies chest pain, shortness of breath, orthopnea, paroxysmal nocturnal dyspnea, pedal edema, palpitations, heart racing, presyncope, syncope.  Physical Examination  Vitals:BP (!) 142/70  Pulse 98  Resp 16  Ht 172.7 cm (5' 8)  Wt 82.6 kg (182 lb)  LMP (LMP Unknown)  SpO2 98%  BMI 27.67 kg/m Ht:172.7 cm (5' 8) Wt:82.6 kg (182 lb) ADJ:Anib surface area is 1.99 meters squared. Body mass index is 27.67 kg/m.  HEENT: Pupils equally reactive to light and accomodation Neck: Supple, no significant JVD Lungs: clear to auscultation bilaterally; no wheezes, rales, rhonchi Heart: Regular rate and rhythm. No murmur Extremities: no pedal edema  Assessment and Plan  78 y.o. female with Preop risk stratification, for spinal stenosis surgery Coronary artery disease status post PCI LAD 2008 following NSTEMI Hypertension Hyperlipidemia Carotid artery disease  Stable from cardiac standpoint without any anginal symptoms. Euvolemic on exam. Regarding preop risk stratification, she will be at intermediate risk for spinal stenosis surgery. No further cardiac evaluation is indicated. Optimized from cardiac standpoint. Blood pressure usually  well-controlled, continue losartan LDL  at goal, continue statin. Low-dose aspirin. Heart healthy diet and regular activity  No orders of the defined types were placed in this encounter.  Return in about 6 months (around 08/29/2024).  KRISHNA CHAITANYA ALLURI, MD  This dictation was prepared with dragon dictation. Any transcription errors that result from this process are unintentional.  Electronically signed by Carrie Keller Grist, MD at 02/29/2024 10:50 AM EST

## 2024-03-13 ENCOUNTER — Ambulatory Visit: Admitting: Physical Therapy

## 2024-03-13 DIAGNOSIS — R262 Difficulty in walking, not elsewhere classified: Secondary | ICD-10-CM

## 2024-03-13 DIAGNOSIS — M6281 Muscle weakness (generalized): Secondary | ICD-10-CM

## 2024-03-13 DIAGNOSIS — M48062 Spinal stenosis, lumbar region with neurogenic claudication: Secondary | ICD-10-CM

## 2024-03-13 DIAGNOSIS — M545 Low back pain, unspecified: Secondary | ICD-10-CM | POA: Diagnosis not present

## 2024-03-13 DIAGNOSIS — G8929 Other chronic pain: Secondary | ICD-10-CM

## 2024-03-13 NOTE — Therapy (Unsigned)
 " OUTPATIENT PHYSICAL THERAPY THORACOLUMBAR/LOWER QUARTER TREATMENT/GOAL UPDATE   Patient Name: Carrie Myers MRN: [id number] DOB:Aug 15, 1945, 78 y.o., female Today's Date: 03/13/2024   END OF SESSION:  PT End of Session - 03/13/24 1259     Visit Number 7    Number of Visits 13    Date for Recertification  03/22/24    PT Start Time 1302    PT Stop Time 1345    PT Time Calculation (min) 43 min    Activity Tolerance Patient tolerated treatment well    Behavior During Therapy [facility] for tasks assessed/performed            Past Medical History:  Diagnosis Date   Allergy    Anxiety    Arthritis    neck, hands, lower back   Bilateral dry eyes    Chronic low back pain 12/23/2016   Coronary artery disease    DDD (degenerative disc disease), lumbar 10/02/2014   Dental crowns present    Implants, front flipper while waiting for other implants   Emphysema of lung (HCC)    Fibromyalgia    Fibromyalgia    Fracture of right foot 01/14/2015   GERD (gastroesophageal reflux disease)    History of Graves' disease    Hyperlipidemia    Hypertension    Hypothyroidism    Myocardial infarction (HCC) 04/22/2006   Osteoarthritis    Other shoulder lesions, right shoulder 04/17/2015   Pre-diabetes    Rheumatoid arthritis (HCC)    Sciatica, left side 01/01/2019   Sleep apnea    CPAP   Status post shoulder surgery 05/27/2015   Thyroid  disease    Vertigo    none for approx 9 yrs   Past Surgical History:  Procedure Laterality Date   ABDOMINAL HYSTERECTOMY     total   APPENDECTOMY     BREAST BIOPSY Right    neg   BREAST CYST ASPIRATION Left    neg   BREAST EXCISIONAL BIOPSY Right    BUNIONECTOMY Bilateral    CESAREAN SECTION     COLONOSCOPY  2010   normal   COLONOSCOPY WITH PROPOFOL  N/A 04/03/2018   Procedure: COLONOSCOPY WITH BIOPSIES;  Surgeon: Jinny Carmine, MD;  Location: [facility];  Service: Endoscopy;  Laterality: N/A;  sleep apnea   CORONARY ANGIOPLASTY  WITH STENT PLACEMENT  04/2006   dental implant     [age]   DILATION AND CURETTAGE OF UTERUS     epidural steroid injection  2018   ESOPHAGOGASTRODUODENOSCOPY N/A 10/03/2020   Procedure: ESOPHAGOGASTRODUODENOSCOPY (EGD);  Surgeon: Maryruth Ole DASEN, MD;  Location: [facility];  Service: Endoscopy;  Laterality: N/A;  REQUESTING MORNING AFTER 9 AM   ESOPHAGOGASTRODUODENOSCOPY (EGD) WITH PROPOFOL  N/A 08/17/2022   Procedure: ESOPHAGOGASTRODUODENOSCOPY (EGD) WITH PROPOFOL ;  Surgeon: Maryruth Ole DASEN, MD;  Location: [facility] ENDOSCOPY;  Service: Endoscopy;  Laterality: N/A;   EYE SURGERY     bilateral cataract extraction   SHOULDER ARTHROSCOPY Right 05/07/2015   Procedure: Extensive arthroscopic debridement and arthroscopic subacromial decompression, right shoulder.                        2.  Steroid injection right thumb CMC joint.;  Surgeon: Norleen JINNY Maltos, MD;  Location: [facility];  Service: Orthopedics;  Laterality: Right;   TOOTH EXTRACTION     Upper centrals and lateral   Patient Active Problem List   Diagnosis Date Noted   Right foot pain 11/01/2023   Arthralgia of  left ankle 10/20/2021   Osteopenia determined by x-ray 03/31/2021   Primary osteoarthritis of left knee 01/22/2021   Effusion, right knee 12/15/2020   Primary osteoarthritis of right knee 11/24/2020   Overactive bladder 06/18/2020   Prediabetes 04/14/2020   Slow transit constipation 06/05/2019   Sciatica, left side 01/01/2019   Diverticulosis of colon 04/25/2018   Change in bowel habits    Tobacco use disorder, moderate, in sustained remission 12/22/2017   Moderate episode of recurrent major depressive disorder (HCC) 10/03/2017   Inflammatory spondylopathy of lumbar region 10/03/2017   Hoarseness, persistent 04/18/2017   OSA on CPAP 02/18/2017   Cervical disc disease 12/31/2016   Generalized osteoarthritis 12/23/2016   Chronic pain syndrome 12/23/2016   Cystocele, midline 12/06/2016   Urinary incontinence  12/06/2016   Impingement syndrome of right shoulder 04/17/2015   Fibrocystic breast 01/18/2015   Gastroesophageal reflux disease without esophagitis 01/18/2015   Hypothyroidism, postablative 01/18/2015   Arteriosclerosis of coronary artery 12/29/2014   DJD of shoulder 11/13/2014   Fibromyalgia 10/02/2014   Intercostal neuralgia 10/02/2014   Facet syndrome, lumbar 10/02/2014   Sacroiliac joint disease 10/02/2014   Carotid artery narrowing 07/09/2014   Essential hypertension 07/01/2014   Osteoarthritis of thumb 09/24/2012   Mixed hyperlipidemia 09/22/2012   Arthritis of hand, degenerative 09/22/2012    PCP: Justus Leita DEL, MD  REFERRING PROVIDER: Clois Fret, MD  REFERRING DIAG: M43.16 (ICD-10-CM) - Spondylolisthesis of lumbar region   RATIONALE FOR EVALUATION AND TREATMENT: Rehabilitation  THERAPY DIAG: Chronic bilateral low back pain, unspecified whether sciatica present  Difficulty in walking, not elsewhere classified  Muscle weakness (generalized)  Spinal stenosis of lumbar region with neurogenic claudication  ONSET DATE: Chronic (10-year history of sciatica episodes with recurrent ESI; ESI in Sept 2025 did not alleviate pain, ongoing worsening symptoms)  FOLLOW-UP APPT SCHEDULED WITH REFERRING PROVIDER: Yes ; f/u with neurosurgery in Jan 2026 for lumbar fusion  PERTINENT HISTORY: Pt is a 78 year old female with hx of chronic back pain/lumbar spinal stenosis. Hx of fibromyalgia.    She now has lumbar fusion planned for 03/21/2024. She has experienced significant back pain for the past ten years, initially affecting her left sciatic nerve. Pain management injections were effective until September, but the pain has worsened over the past two months. The pain is centered in her lower back, radiating down both legs and stopping just above ankle, and is exacerbated by walking or standing for prolonged periods. She uses a walking stick for support and wears a brace when  active.   Pt has known grade 1 anterolisthesis at L4-5. MRI shows facet osteoarthritis focally advanced at L4-5. Focally advanced disc degeneration at L5-S1 with progression from 2011. Mild foraminal narrowing at this level.  Coughing and sneezing exacerbate symptoms. Patient reports pain with urinating and having bowel movement due to back pain.   PAIN:    Pain Intensity: Present: 4/10, Best: 4/10, Worst: 10/10 Pain location: Across back, lumbosacral level Pain Quality: burning, shooting Radiating: Yes; down LEs and stopping just short of ankles (L worse than R) Numbness/Tingling: No Focal Weakness: No Aggravating factors: standing, bending over, walking Relieving factors: on the move; sitting on recliner/reclined position; heating pad  24-hour pain behavior: significant pain in AM and getting out of bed  How long can you stand: 15-20 min standing History of prior back injury, pain, surgery, or therapy: Yes; No hx of back surgery; chronic back pain with repeated ESIs, surgery planned for January 2026  Imaging: Yes ;  MR  Lumbar spine; 05/24/23 Facet osteoarthritis focally advanced at L4-5 where there is anterolisthesis and advanced spinal stenosis that is progressed from 2011. Focally advanced disc degeneration at L5-S1 with progression from 2011. Mild foraminal narrowing at this level.   Red flags: Negative for bowel/bladder changes, saddle paresthesia, personal history of cancer, h/o spinal tumors, h/o compression fx, h/o abdominal aneurysm, abdominal pain, chills/fever, night sweats, nausea, vomiting, unrelenting pain, first onset of insidious LBP <20 y/o  PRECAUTIONS: None  WEIGHT BEARING RESTRICTIONS: No  FALLS: Has patient fallen in last 6 months? No  Living Environment Lives with: lives alone Lives in: House/apartment; one level inside home Stairs: 1 step into home from garage Has following equipment at home: rubber non-slip mat in shower  Prior level of function:  Independent  Occupational demands: Retired  Presenter, Broadcasting: Cooking, walking exercise routine  Patient Goals: Improve pain and try to manage pain prior to surgery    OBJECTIVE (data from initial evaluation unless otherwise dated):   Patient Surveys  Modified Oswestry: 22/50 = 44%  Cognition Patient is oriented to person, place, and time.  Recent memory is intact.  Remote memory is intact.  Attention span and concentration are intact.  Expressive speech is intact.  Patient's fund of knowledge is within normal limits for educational level.    Gross Musculoskeletal Assessment Tremor: None Bulk: Normal Tone: Normal No visible step-off along spinal column, no signs of scoliosis   GAIT: Distance walked: 40 fit Assistive device utilized: Walking stick, L side Level of assistance: SBA Comments: Guarded posture, L>R pelvic drop with dec trunk rotation, dec arm swing  Posture: Lumbar lordosis: Decreased Increased thoracic kyphosis Iliac crest height: Equal bilaterally Lumbar lateral shift: Negative  AROM AROM (Normal range in degrees) AROM  02/07/2024 AROM 03/13/24  Lumbar    Flexion (65) 75%* 75%*  Extension (30) 75%* 50%*  Right lateral flexion (25) 75%* WNL* (pain into BLE)  Left lateral flexion (25) 75%* WNL  Right rotation (30) WNL WNL  Left rotation (30) 75%*  50%*       Hip Right Left   Flexion (125) WNL WNL   Extension (15)     Abduction (40)     Adduction      Internal Rotation (45)     External Rotation (45) WNL WNL        Knee     Flexion (135)     Extension (0)          Ankle     Dorsiflexion (20)     Plantarflexion (50)     Inversion (35)     Eversion (15)     (* = pain; Blank rows = not tested)  LE MMT: MMT (out of 5) Right 02/07/2024 Left 02/07/2024  Hip flexion 4 4-  Hip extension    Hip abduction 4* 4*  Hip adduction    Hip internal rotation    Hip external rotation    Knee flexion 5   Knee extension 4+* 4*  Ankle dorsiflexion 4+ 4-   Ankle plantarflexion    Ankle inversion    Ankle eversion    (* = pain; Blank rows = not tested)  Sensation Grossly intact to light touch throughout bilateral LEs as determined by testing dermatomes L2-S2. Proprioception, stereognosis, and hot/cold testing deferred on this date.  Reflexes R/L Knee Jerk (L3/4): 2+/2+  Ankle Jerk (S1/2): 2+/2+  Clonus: Negative Hoffman's Negative  Muscle Length Hamstrings: R: Only 10 deg deficit; L: Only 10 deg deficit  Ely (quadriceps): R: Not examined L: Not examined Thomas (hip flexors): R: Not examined L: Not examined   Palpation Location Right Left         Lumbar paraspinals   1 2  Quadratus Lumborum    Gluteus Maximus 1 1  Gluteus Medius 1 1  Deep hip external rotators    PSIS 2 2  Fortin's Area (SIJ)    Greater Trochanter    (Blank rows = not tested) Graded on 0-4 scale (0 = no pain, 1 = pain, 2 = pain with wincing/grimacing/flinching, 3 = pain with withdrawal, 4 = unwilling to allow palpation)  Passive Accessory  Motion Deferred  Special Tests (02/13/24) Lumbar Radiculopathy and Discogenic: Centralization and Peripheralization (SN 92, -LR 0.12):  -Repeated flexion in lying: no effect during, moderate gluteal pain R>L immediately afterward; (minimal pain in lying after rest break) Slump (SN 83, -LR 0.32): R: Negative  L: Positive   SLR (SN 92, -LR 0.29): R: Negative   L: Positive   General lumbar traction in hooklying: No effect  Facet Joint: Extension-Rotation (SN 100, -LR 0.0): R: Positive   L: Positive    Lumbar Foraminal Stenosis: Lumbar quadrant (SN 70): R: Negative   L: Negative    Hip: FABER (SN 81): R: Positive for back pain   L: Positive for back pain       TODAY'S TREATMENT: DATE: 03/14/2024    SUBJECTIVE STATEMENT:   Patient reports 5-6/10 pain at arrival to PT - pt reports having severe pain (about as bad as it's been) yesterday. Patient reports her pain determines her level of function. Pt reports  worsening pain with being on the go and higher volume of walking. Patient reports 30-35% global rating of function at this time. Patient reports 7-8/10 pain at worst over the previous week. She reports progressive worsening since first seeing Dr. Clois.    *GOAL UPDATE PERFORMED   Therapeutic Exercise - for improved soft tissue flexibility and extensibility as needed for ROM, improved strength as needed to improve performance of CKC activities/functional movements  Posterior pelvic tilt; 1 x 10, in hooklying  -for HEP review   PPT with bridge, partial ROM/beginner bridge; 1 x 10   -for HEP review   PATIENT EDUCATION: Updated home exercise program for pt to continue prior to moving forward with lumbar fusion. Discussed expectations in early and sub-acute post-operative phases and discussed discontinuing current program immediately after surgery until informed by MD and given new exercises with PT. Answered pt questions regarding course of home health and transition to outpatient rehab following neurosurgery.     *not today* Nustep level 3 (seat 10) x 5 minutes to improve trunk mobility and overall BLE strength, nervous system downregulation  -MHP applied along low back during time on NuStep Physioball 3-way rollout, blue ball; x 5 ea dir, 5 sec hold; anterior/anterolateral R/L Seated forward trunk flexion 2 x 10  Seated marching 2 x 10 each LE - cues for core activation  Seated marching while seated on green dynadisc 2 x 10 each LE   Neuromuscular Re-education - for nervous system downregulation, desensitization   *not today*  STM and IASTM with Hypervolt along bilat longissimus lumborum L3-S1 and bilateral gluteal musculature; x 25 minutes   PATIENT EDUCATION:  Education details: see above for patient education details Person educated: Patient Education method: Explanation and Demonstration Education comprehension: verbalized understanding   HOME EXERCISE PROGRAM:  Access  Code: [email or web address] URL: [email or web address] Date: 03/13/2024 Prepared by: Venetia Endo  Exercises - Supine Double Knee to Chest  - 5-6 x daily - 7 x weekly - 1 sets - 10 reps - 1sec hold - Supine Lower Trunk Rotation  - 2 x daily - 7 x weekly - 2 sets - 10 reps - 2 sec hold - Hooklying Hamstring Stretch  - 2 x daily - 7 x weekly - 2 sets - 10 reps - 1sec hold - Supine Piriformis Stretch with Foot on Ground  - 2 x daily - 7 x weekly - 3 sets - 30sec hold - Supine Posterior Pelvic Tilt  - 2 x daily - 7 x weekly - 2 sets - 10 reps - Beginner Bridge  - 1 x daily - 5-7 x weekly - 2 sets - 8 reps   ASSESSMENT:  CLINICAL IMPRESSION:   Significant time today was spent updating goals and discussing current level of function and readiness for moving forward with lumbar fusion next week (DOS 03/21/24). Pt does have clinically significant improvement in NPRS at worst, but no major change overall with thoracolumbar AROM. mODI/disability index is modestly decreased, but pt has not met detectable change for this test. Pt feels that she does not have notable relief of pain to date with conservative measures. Heat and e-stim are acute pain relievers only. Pt has been limited with tolerance of exercise and has notable pain with transfers, bed mobility, and thoracolumbar AROM with episodic severe pain that limits even low-level activity. Walking distance is markedly limited and further walking/ambulatory activity exacerbates back/L>R LE pain (consistent with lumbar spinal stenosis). Pt will be moving forward with surgery next week given ongoing pain/functional limitation and exhaustion of conservative intervention. Pt to continue with home exercises until surgery date; then, she will follow new guidelines/exercises set by surgeon and PT/OT team post-op.    OBJECTIVE IMPAIRMENTS: Abnormal gait, difficulty walking, decreased ROM, decreased strength, impaired flexibility, postural dysfunction, and pain.    ACTIVITY LIMITATIONS: carrying, lifting, bending, sitting, sleeping, transfers, and locomotion level  PARTICIPATION LIMITATIONS: meal prep, driving, shopping, community activity, and church  PERSONAL FACTORS: Age, Past/current experiences, Time since onset of injury/illness/exacerbation, and 3+ comorbidities: (anxiety, CAD, emphysema, fibromyalgia, Grave's disease, RA, OA, HLD, HTN) are also affecting patient's functional outcome.   REHAB POTENTIAL: Fair due to significant comorbidities, chronicity of pain, fibromyalgia, and upcoming planned fusion  CLINICAL DECISION MAKING: Unstable/unpredictable  EVALUATION COMPLEXITY: High   GOALS: Goals reviewed with patient? No  SHORT TERM GOALS: Target date: 02/29/2024  Pt will be independent with HEP in order to improve strength and decrease back pain to improve pain-free function at home and work. Baseline: 02/08/2024: Baseline HEP initiated 03/13/24: Pt reports exercises do help, works on them in AM mainly before getting up. Goal status: PARTIALLY MET   LONG TERM GOALS: Target date: 03/21/2024  Patient will have full thoracolumbar AROM without reproduction of pain as needed for reaching items on ground, household chores, bending. Baseline: 02/08/24: Decreased flexion and extension ROM, pain with extension/concordant sign.    03/13/24: Mixed progress with some motions modestly improved, some modestly worse.  Goal status: NOT MET   2.  Pt will decrease worst back pain by at least 2 points on the NPRS in order to demonstrate clinically significant reduction in back pain. Baseline: 111/26/25: 10/10 at worst    03/13/24: 7-8/10 pain at worst over previous week. Goal status: ACHIEVED  3.  Pt will decrease mODI score by at least 10 points in order demonstrate clinically significant reduction in back pain/disability.  Baseline: 02/08/24:  22/50 = 44%    03/13/24: 38% Goal status: PARTIAL PROGRESS  4.  Pt will be able to complete  community-level ambulation with LRAD without exacerbation of back pain as needed for accessing local community and church/philanthropic events Baseline: 02/08/24: Severe pain with prolonged walking and standing    03/13/24: Unable to walk for notable distance outside of home.  Goal status: NOT MET    PLAN: PT FREQUENCY: -  PT DURATION: -  PLAN FOR NEXT SESSION: pt to continue with HEP until surgery on 03/21/24.    Venetia Endo, PT, DPT #[id number]  Venetia ONEIDA Endo, PT 03/14/2024, 7:45 AM  "

## 2024-03-14 ENCOUNTER — Encounter: Payer: Self-pay | Admitting: Physical Therapy

## 2024-03-16 NOTE — Progress Notes (Signed)
"  Favor ASB over UTI. No Tx indicated at this time. "

## 2024-03-19 ENCOUNTER — Telehealth: Payer: Self-pay | Admitting: Anesthesiology

## 2024-03-19 NOTE — Telephone Encounter (Signed)
 Pt stated that she would like for you to be her Anesthesiology on the 7th for her surgery.

## 2024-03-21 ENCOUNTER — Encounter: Payer: Self-pay | Admitting: Neurosurgery

## 2024-03-21 ENCOUNTER — Ambulatory Visit

## 2024-03-21 ENCOUNTER — Ambulatory Visit: Payer: Self-pay | Admitting: Urgent Care

## 2024-03-21 ENCOUNTER — Encounter
Admission: RE | Disposition: A | Discharge status: Home / Self Care | Payer: Self-pay | Source: Ambulatory Visit | Attending: Neurosurgery

## 2024-03-21 ENCOUNTER — Other Ambulatory Visit: Payer: Self-pay

## 2024-03-21 ENCOUNTER — Observation Stay
Admission: RE | Admit: 2024-03-21 | Discharge: 2024-03-22 | Disposition: A | Discharge status: Home / Self Care | Source: Ambulatory Visit | Attending: Neurosurgery | Admitting: Neurosurgery

## 2024-03-21 DIAGNOSIS — Z01818 Encounter for other preprocedural examination: Secondary | ICD-10-CM

## 2024-03-21 DIAGNOSIS — G8929 Other chronic pain: Secondary | ICD-10-CM | POA: Diagnosis not present

## 2024-03-21 DIAGNOSIS — I2510 Atherosclerotic heart disease of native coronary artery without angina pectoris: Secondary | ICD-10-CM | POA: Insufficient documentation

## 2024-03-21 DIAGNOSIS — Z87891 Personal history of nicotine dependence: Secondary | ICD-10-CM | POA: Diagnosis not present

## 2024-03-21 DIAGNOSIS — M4316 Spondylolisthesis, lumbar region: Secondary | ICD-10-CM | POA: Diagnosis not present

## 2024-03-21 DIAGNOSIS — Z955 Presence of coronary angioplasty implant and graft: Secondary | ICD-10-CM | POA: Diagnosis not present

## 2024-03-21 DIAGNOSIS — I10 Essential (primary) hypertension: Secondary | ICD-10-CM | POA: Insufficient documentation

## 2024-03-21 DIAGNOSIS — M5442 Lumbago with sciatica, left side: Secondary | ICD-10-CM

## 2024-03-21 DIAGNOSIS — M5441 Lumbago with sciatica, right side: Secondary | ICD-10-CM | POA: Insufficient documentation

## 2024-03-21 DIAGNOSIS — M48062 Spinal stenosis, lumbar region with neurogenic claudication: Secondary | ICD-10-CM | POA: Diagnosis not present

## 2024-03-21 DIAGNOSIS — J449 Chronic obstructive pulmonary disease, unspecified: Secondary | ICD-10-CM | POA: Diagnosis not present

## 2024-03-21 DIAGNOSIS — Z79899 Other long term (current) drug therapy: Secondary | ICD-10-CM | POA: Insufficient documentation

## 2024-03-21 DIAGNOSIS — Z981 Arthrodesis status: Principal | ICD-10-CM

## 2024-03-21 DIAGNOSIS — E039 Hypothyroidism, unspecified: Secondary | ICD-10-CM | POA: Diagnosis not present

## 2024-03-21 HISTORY — PX: ANTERIOR LATERAL LUMBAR FUSION WITH PERCUTANEOUS SCREW 1 LEVEL: SHX5553

## 2024-03-21 HISTORY — PX: APPLICATION OF INTRAOPERATIVE CT SCAN: SHX6668

## 2024-03-21 LAB — ABO/RH: ABO/RH(D): B POS

## 2024-03-21 SURGERY — ANTERIOR LATERAL LUMBAR FUSION WITH PERCUTANEOUS SCREW 1 LEVEL
Anesthesia: General

## 2024-03-21 MED ORDER — ARMODAFINIL 150 MG PO TABS: 150.0000 mg | ORAL_TABLET | Freq: Every day | ORAL | Status: DC

## 2024-03-21 MED ORDER — VANCOMYCIN HCL IN DEXTROSE 1-5 GM/200ML-% IV SOLN
1000.0000 mg | Freq: Once | INTRAVENOUS | Status: AC
  Administered 2024-03-21: 1000 mg via INTRAVENOUS

## 2024-03-21 MED ORDER — ENOXAPARIN SODIUM 40 MG/0.4ML IJ SOSY
40.0000 mg | PREFILLED_SYRINGE | INTRAMUSCULAR | Status: DC
  Administered 2024-03-22: 40 mg via SUBCUTANEOUS
  Filled 2024-03-21: qty 0.4

## 2024-03-21 MED ORDER — OXYCODONE HCL 5 MG/5ML PO SOLN: 5.0000 mg | Freq: Once | ORAL | Status: AC | PRN

## 2024-03-21 MED ORDER — METHOCARBAMOL 1000 MG/10ML IJ SOLN
500.0000 mg | Freq: Four times a day (QID) | INTRAMUSCULAR | Status: DC | PRN
  Administered 2024-03-21: 500 mg via INTRAVENOUS

## 2024-03-21 MED ORDER — ALBUTEROL SULFATE HFA 108 (90 BASE) MCG/ACT IN AERS
1.0000 | INHALATION_SPRAY | Freq: Four times a day (QID) | RESPIRATORY_TRACT | Status: DC | PRN
  Filled 2024-03-21: qty 6.7

## 2024-03-21 MED ORDER — EPHEDRINE 5 MG/ML INJ
INTRAVENOUS | Status: AC
  Filled 2024-03-21: qty 5

## 2024-03-21 MED ORDER — FENTANYL CITRATE (PF) 100 MCG/2ML IJ SOLN
25.0000 ug | INTRAMUSCULAR | Status: DC | PRN
  Administered 2024-03-21 (×6): 25 ug via INTRAVENOUS

## 2024-03-21 MED ORDER — SURGIFLO WITH THROMBIN (HEMOSTATIC MATRIX KIT) OPTIME
TOPICAL | Status: DC | PRN
  Administered 2024-03-21: 1 via TOPICAL

## 2024-03-21 MED ORDER — METHOCARBAMOL 500 MG PO TABS
500.0000 mg | ORAL_TABLET | Freq: Four times a day (QID) | ORAL | Status: DC | PRN
  Administered 2024-03-21: 500 mg via ORAL
  Filled 2024-03-21: qty 1

## 2024-03-21 MED ORDER — LACTATED RINGERS IV SOLN: INTRAVENOUS | Status: DC | PRN

## 2024-03-21 MED ORDER — VANCOMYCIN HCL IN DEXTROSE 1-5 GM/200ML-% IV SOLN
INTRAVENOUS | Status: AC
  Filled 2024-03-21: qty 200

## 2024-03-21 MED ORDER — ONDANSETRON HCL 4 MG PO TABS: 4.0000 mg | ORAL_TABLET | Freq: Four times a day (QID) | ORAL | Status: DC | PRN

## 2024-03-21 MED ORDER — SODIUM CHLORIDE 0.9% FLUSH: 3.0000 mL | INTRAVENOUS | Status: DC | PRN

## 2024-03-21 MED ORDER — SODIUM CHLORIDE 0.9 % IV SOLN: 250.0000 mL | INTRAVENOUS | Status: AC

## 2024-03-21 MED ORDER — PROPOFOL 500 MG/50ML IV EMUL
INTRAVENOUS | Status: DC | PRN
  Administered 2024-03-21: 50 ug/kg/min via INTRAVENOUS

## 2024-03-21 MED ORDER — SORBITOL 70 % SOLN: 30.0000 mL | Freq: Every day | Status: DC | PRN

## 2024-03-21 MED ORDER — LIDOCAINE HCL (CARDIAC) PF 100 MG/5ML IV SOSY
PREFILLED_SYRINGE | INTRAVENOUS | Status: DC | PRN
  Administered 2024-03-21: 60 mg via INTRAVENOUS

## 2024-03-21 MED ORDER — LACTATED RINGERS IV SOLN: INTRAVENOUS | Status: DC

## 2024-03-21 MED ORDER — DOCUSATE SODIUM 100 MG PO CAPS
100.0000 mg | ORAL_CAPSULE | Freq: Two times a day (BID) | ORAL | Status: DC
  Administered 2024-03-21 – 2024-03-22 (×2): 100 mg via ORAL
  Filled 2024-03-21 (×2): qty 1

## 2024-03-21 MED ORDER — REMIFENTANIL HCL 1 MG IV SOLR
INTRAVENOUS | Status: AC
  Filled 2024-03-21: qty 1000

## 2024-03-21 MED ORDER — GLYCOPYRROLATE 0.2 MG/ML IJ SOLN
INTRAMUSCULAR | Status: AC
  Filled 2024-03-21: qty 1

## 2024-03-21 MED ORDER — FENTANYL CITRATE (PF) 100 MCG/2ML IJ SOLN
INTRAMUSCULAR | Status: DC | PRN
  Administered 2024-03-21: 50 ug via INTRAVENOUS

## 2024-03-21 MED ORDER — POLYETHYLENE GLYCOL 3350 17 G PO PACK: 17.0000 g | PACK | Freq: Every day | ORAL | Status: DC | PRN

## 2024-03-21 MED ORDER — LOSARTAN POTASSIUM 50 MG PO TABS
100.0000 mg | ORAL_TABLET | Freq: Every day | ORAL | Status: DC
  Administered 2024-03-22: 100 mg via ORAL
  Filled 2024-03-21: qty 2

## 2024-03-21 MED ORDER — OXYCODONE HCL 5 MG PO TABS
5.0000 mg | ORAL_TABLET | Freq: Once | ORAL | Status: AC | PRN
  Administered 2024-03-21: 5 mg via ORAL

## 2024-03-21 MED ORDER — SUCCINYLCHOLINE CHLORIDE 200 MG/10ML IV SOSY
PREFILLED_SYRINGE | INTRAVENOUS | Status: AC
  Filled 2024-03-21: qty 10

## 2024-03-21 MED ORDER — LIDOCAINE HCL (PF) 2 % IJ SOLN
INTRAMUSCULAR | Status: AC
  Filled 2024-03-21: qty 5

## 2024-03-21 MED ORDER — ONDANSETRON HCL 4 MG/2ML IJ SOLN
INTRAMUSCULAR | Status: DC | PRN
  Administered 2024-03-21: 4 mg via INTRAVENOUS

## 2024-03-21 MED ORDER — PROPOFOL 10 MG/ML IV BOLUS
INTRAVENOUS | Status: DC | PRN
  Administered 2024-03-21: 150 mg via INTRAVENOUS
  Administered 2024-03-21: 30 mg via INTRAVENOUS

## 2024-03-21 MED ORDER — CHLORHEXIDINE GLUCONATE 4 % EX SOLN
1.0000 | CUTANEOUS | 1 refills | Status: AC
  Filled 2024-03-21: qty 236, 7d supply, fill #0

## 2024-03-21 MED ORDER — ALBUTEROL SULFATE HFA 108 (90 BASE) MCG/ACT IN AERS
INHALATION_SPRAY | RESPIRATORY_TRACT | Status: DC | PRN
  Administered 2024-03-21: 6 via RESPIRATORY_TRACT

## 2024-03-21 MED ORDER — SENNA 8.6 MG PO TABS
1.0000 | ORAL_TABLET | Freq: Two times a day (BID) | ORAL | Status: DC
  Administered 2024-03-21 – 2024-03-22 (×2): 8.6 mg via ORAL
  Filled 2024-03-21 (×2): qty 1

## 2024-03-21 MED ORDER — LEVOTHYROXINE SODIUM 88 MCG PO TABS
88.0000 ug | ORAL_TABLET | Freq: Every day | ORAL | Status: DC
  Administered 2024-03-22: 88 ug via ORAL
  Filled 2024-03-21 (×2): qty 1

## 2024-03-21 MED ORDER — ONDANSETRON HCL 4 MG/2ML IJ SOLN: 4.0000 mg | Freq: Four times a day (QID) | INTRAMUSCULAR | Status: DC | PRN

## 2024-03-21 MED ORDER — SUCCINYLCHOLINE CHLORIDE 200 MG/10ML IV SOSY
PREFILLED_SYRINGE | INTRAVENOUS | Status: DC | PRN
  Administered 2024-03-21: 120 mg via INTRAVENOUS

## 2024-03-21 MED ORDER — DROPERIDOL 2.5 MG/ML IJ SOLN: 0.6250 mg | Freq: Once | INTRAMUSCULAR | Status: DC | PRN

## 2024-03-21 MED ORDER — FENTANYL CITRATE (PF) 100 MCG/2ML IJ SOLN
INTRAMUSCULAR | Status: AC
  Filled 2024-03-21: qty 2

## 2024-03-21 MED ORDER — VANCOMYCIN HCL 1000 MG IV SOLR
INTRAVENOUS | Status: DC | PRN
  Administered 2024-03-21: 1000 mg via INTRAVENOUS

## 2024-03-21 MED ORDER — PROPOFOL 1000 MG/100ML IV EMUL
INTRAVENOUS | Status: AC
  Filled 2024-03-21: qty 100

## 2024-03-21 MED ORDER — DULOXETINE HCL 30 MG PO CPEP
60.0000 mg | ORAL_CAPSULE | Freq: Every day | ORAL | Status: DC
  Administered 2024-03-22: 60 mg via ORAL
  Filled 2024-03-21: qty 2

## 2024-03-21 MED ORDER — SODIUM CHLORIDE 0.9% FLUSH
3.0000 mL | Freq: Two times a day (BID) | INTRAVENOUS | Status: DC
  Administered 2024-03-21 – 2024-03-22 (×2): 3 mL via INTRAVENOUS

## 2024-03-21 MED ORDER — MAGNESIUM CITRATE PO SOLN: 1.0000 | Freq: Once | ORAL | Status: DC | PRN

## 2024-03-21 MED ORDER — PRAVASTATIN SODIUM 20 MG PO TABS
80.0000 mg | ORAL_TABLET | Freq: Every day | ORAL | Status: DC
  Administered 2024-03-21: 80 mg via ORAL
  Filled 2024-03-21: qty 4

## 2024-03-21 MED ORDER — CEFAZOLIN SODIUM-DEXTROSE 2-4 GM/100ML-% IV SOLN
2.0000 g | Freq: Once | INTRAVENOUS | Status: AC
  Administered 2024-03-21: 2 g via INTRAVENOUS

## 2024-03-21 MED ORDER — BUPIVACAINE-EPINEPHRINE 0.5% -1:200000 IJ SOLN
INTRAMUSCULAR | Status: DC | PRN
  Administered 2024-03-21: 6 mL
  Administered 2024-03-21: 7 mL

## 2024-03-21 MED ORDER — KETOROLAC TROMETHAMINE 15 MG/ML IJ SOLN
7.5000 mg | Freq: Four times a day (QID) | INTRAMUSCULAR | Status: AC
  Administered 2024-03-21 – 2024-03-22 (×4): 7.5 mg via INTRAVENOUS
  Filled 2024-03-21 (×4): qty 1

## 2024-03-21 MED ORDER — OXYCODONE HCL 5 MG PO TABS: 10.0000 mg | ORAL_TABLET | ORAL | Status: DC | PRN

## 2024-03-21 MED ORDER — METHOCARBAMOL 1000 MG/10ML IJ SOLN
INTRAMUSCULAR | Status: AC
  Filled 2024-03-21: qty 10

## 2024-03-21 MED ORDER — ONDANSETRON HCL 4 MG/2ML IJ SOLN
INTRAMUSCULAR | Status: AC
  Filled 2024-03-21: qty 2

## 2024-03-21 MED ORDER — DEXAMETHASONE SOD PHOSPHATE PF 10 MG/ML IJ SOLN
INTRAMUSCULAR | Status: AC
  Filled 2024-03-21: qty 1

## 2024-03-21 MED ORDER — ACETAMINOPHEN 10 MG/ML IV SOLN
INTRAVENOUS | Status: DC | PRN
  Administered 2024-03-21: 1000 mg via INTRAVENOUS

## 2024-03-21 MED ORDER — OXYCODONE HCL 5 MG PO TABS
5.0000 mg | ORAL_TABLET | ORAL | Status: DC | PRN
  Administered 2024-03-21 – 2024-03-22 (×2): 5 mg via ORAL
  Filled 2024-03-21 (×2): qty 1

## 2024-03-21 MED ORDER — DIPHENHYDRAMINE-APAP (SLEEP) 25-500 MG PO TABS: 1.0000 | ORAL_TABLET | Freq: Every evening | ORAL | Status: DC | PRN

## 2024-03-21 MED ORDER — CHLORHEXIDINE GLUCONATE 0.12 % MT SOLN
OROMUCOSAL | Status: AC
  Filled 2024-03-21: qty 15

## 2024-03-21 MED ORDER — REMIFENTANIL HCL 1 MG IV SOLR
INTRAVENOUS | Status: DC | PRN
  Administered 2024-03-21: .2 ug/kg/min via INTRAVENOUS

## 2024-03-21 MED ORDER — PHENYLEPHRINE 80 MCG/ML (10ML) SYRINGE FOR IV PUSH (FOR BLOOD PRESSURE SUPPORT)
PREFILLED_SYRINGE | INTRAVENOUS | Status: AC
  Filled 2024-03-21: qty 10

## 2024-03-21 MED ORDER — MUPIROCIN 2 % EX OINT
1.0000 | TOPICAL_OINTMENT | Freq: Two times a day (BID) | CUTANEOUS | 0 refills | Status: AC
  Filled 2024-03-21: qty 22, 30d supply, fill #0

## 2024-03-21 MED ORDER — ORAL CARE MOUTH RINSE: 15.0000 mL | Freq: Once | OROMUCOSAL | Status: AC

## 2024-03-21 MED ORDER — PROPOFOL 10 MG/ML IV BOLUS
INTRAVENOUS | Status: AC
  Filled 2024-03-21: qty 20

## 2024-03-21 MED ORDER — CEFAZOLIN SODIUM-DEXTROSE 2-4 GM/100ML-% IV SOLN
INTRAVENOUS | Status: AC
  Filled 2024-03-21: qty 100

## 2024-03-21 MED ORDER — OXYCODONE HCL 5 MG PO TABS
ORAL_TABLET | ORAL | Status: AC
  Filled 2024-03-21: qty 1

## 2024-03-21 MED ORDER — IRRISEPT - 450ML BOTTLE WITH 0.05% CHG IN STERILE WATER, USP 99.95% OPTIME
TOPICAL | Status: DC | PRN
  Administered 2024-03-21: 450 mL via TOPICAL

## 2024-03-21 MED ORDER — MENTHOL 3 MG MT LOZG: 1.0000 | LOZENGE | OROMUCOSAL | Status: DC | PRN

## 2024-03-21 MED ORDER — PHENYLEPHRINE HCL-NACL 20-0.9 MG/250ML-% IV SOLN
INTRAVENOUS | Status: DC | PRN
  Administered 2024-03-21: 80 ug via INTRAVENOUS
  Administered 2024-03-21: 20 ug/min via INTRAVENOUS

## 2024-03-21 MED ORDER — CHLORHEXIDINE GLUCONATE 0.12 % MT SOLN
15.0000 mL | Freq: Once | OROMUCOSAL | Status: AC
  Administered 2024-03-21: 15 mL via OROMUCOSAL

## 2024-03-21 MED ORDER — PHENYLEPHRINE HCL-NACL 20-0.9 MG/250ML-% IV SOLN
INTRAVENOUS | Status: AC
  Filled 2024-03-21: qty 250

## 2024-03-21 MED ORDER — 0.9 % SODIUM CHLORIDE (POUR BTL) OPTIME
TOPICAL | Status: DC | PRN
  Administered 2024-03-21: 300 mL

## 2024-03-21 MED ORDER — ACETAMINOPHEN 10 MG/ML IV SOLN: 1000.0000 mg | Freq: Once | INTRAVENOUS | Status: DC | PRN

## 2024-03-21 MED ORDER — ACETAMINOPHEN 650 MG RE SUPP: 650.0000 mg | RECTAL | Status: DC | PRN

## 2024-03-21 MED ORDER — DEXAMETHASONE SOD PHOSPHATE PF 10 MG/ML IJ SOLN
INTRAMUSCULAR | Status: DC | PRN
  Administered 2024-03-21: 10 mg via INTRAVENOUS

## 2024-03-21 MED ORDER — HYDROMORPHONE HCL 1 MG/ML IJ SOLN: 0.5000 mg | INTRAMUSCULAR | Status: AC | PRN

## 2024-03-21 MED ORDER — DIAZEPAM 5 MG PO TABS: 5.0000 mg | ORAL_TABLET | Freq: Every evening | ORAL | Status: DC | PRN

## 2024-03-21 MED ORDER — PHENOL 1.4 % MT LIQD: 1.0000 | OROMUCOSAL | Status: DC | PRN

## 2024-03-21 MED ORDER — ACETAMINOPHEN 325 MG PO TABS: 650.0000 mg | ORAL_TABLET | ORAL | Status: DC | PRN

## 2024-03-21 SURGICAL SUPPLY — 65 items
ALLOGRAFT BONESTRIP KORE 2.5X5 (Bone Implant) IMPLANT
BASIN KIT SINGLE STR (MISCELLANEOUS) ×1 IMPLANT
CHLORAPREP W/TINT 26 (MISCELLANEOUS) ×2 IMPLANT
CORD BIP STRL DISP 12FT (MISCELLANEOUS) ×1 IMPLANT
CORD LIGHT LATERIAL X LIFT (MISCELLANEOUS) IMPLANT
DERMABOND ADVANCED .7 DNX12 (GAUZE/BANDAGES/DRESSINGS) ×3 IMPLANT
DRAPE C ARM PK CFD 31 SPINE (DRAPES) ×1 IMPLANT
DRAPE C-ARMOR (DRAPES) ×1 IMPLANT
DRAPE HD 5FT BACK TABLE (DRAPES) ×1 IMPLANT
DRAPE INCISE IOBAN 66X45 STRL (DRAPES) ×1 IMPLANT
DRAPE LAPAROTOMY 100X77 ABD (DRAPES) ×2 IMPLANT
DRAPE POUCH INSTRU U-SHP 10X18 (DRAPES) ×1 IMPLANT
DRAPE SCAN PATIENT (DRAPES) ×1 IMPLANT
DRAPE SPINE LEICA/WILD 54X150 (DRAPES) IMPLANT
DRAPE TABLE BACK 80X90 (DRAPES) ×1 IMPLANT
DRSG OPSITE POSTOP 4X6 (GAUZE/BANDAGES/DRESSINGS) IMPLANT
DRSG TEGADERM 2-3/8X2-3/4 SM (GAUZE/BANDAGES/DRESSINGS) IMPLANT
DRSG TEGADERM 4X4.75 (GAUZE/BANDAGES/DRESSINGS) IMPLANT
DRSG TEGADERM 6X8 (GAUZE/BANDAGES/DRESSINGS) IMPLANT
ELECTRODE REM PT RTRN 9FT ADLT (ELECTROSURGICAL) ×2 IMPLANT
EX-PIN ORTHOLOCK NAV 4X150 (PIN) ×1 IMPLANT
FEE CVG SUPP BRAINLAB NG SPNE (MISCELLANEOUS) ×1 IMPLANT
FORCEPS BPLR BAYO 10IN 1.0TIP (ORTHOPEDIC DISPOSABLE SUPPLIES) IMPLANT
GAUZE 4X4 16PLY ~~LOC~~+RFID DBL (SPONGE) IMPLANT
GAUZE SPONGE 2X2 STRL 8-PLY (GAUZE/BANDAGES/DRESSINGS) IMPLANT
GLOVE BIOGEL PI IND STRL 6.5 (GLOVE) ×3 IMPLANT
GLOVE SURG SYN 6.5 PF PI (GLOVE) ×5 IMPLANT
GLOVE SURG SYN 8.5 PF PI (GLOVE) ×6 IMPLANT
GOWN SRG LRG LVL 4 IMPRV REINF (GOWNS) ×2 IMPLANT
GOWN SRG XL LVL 3 NONREINFORCE (GOWNS) ×2 IMPLANT
GUIDEWIRE NITINOL BEVEL TIP (WIRE) IMPLANT
HOLDER FOLEY CATH W/STRAP (MISCELLANEOUS) ×1 IMPLANT
KIT DILATOR XLIF 5 (KITS) IMPLANT
KIT MAXCESS (KITS) IMPLANT
KIT SPINAL PRONEVIEW (KITS) ×1 IMPLANT
KIT TURNOVER KIT A (KITS) ×1 IMPLANT
LAVAGE JET IRRISEPT WOUND (IRRIGATION / IRRIGATOR) ×1 IMPLANT
MANIFOLD NEPTUNE II (INSTRUMENTS) ×2 IMPLANT
MARKER SKIN DUAL TIP RULER LAB (MISCELLANEOUS) IMPLANT
MARKER SPHERE PSV REFLC 13MM (MARKER) ×7 IMPLANT
MODULE NVM5 NEXT GEN EMG (NEUROSURGERY SUPPLIES) IMPLANT
NDL SAFETY ECLIP 18X1.5 (MISCELLANEOUS) ×1 IMPLANT
NS IRRIG 500ML POUR BTL (IV SOLUTION) ×1 IMPLANT
PACK LAMINECTOMY ARMC (PACKS) ×1 IMPLANT
PAD ARMBOARD POSITIONER FOAM (MISCELLANEOUS) ×1 IMPLANT
PENCIL SMOKE EVACUATOR (MISCELLANEOUS) ×1 IMPLANT
ROD RELINE MAS LORD 5.5X45MM (Rod) IMPLANT
ROD RELINE MAS TI LORD 5.5X40 (Rod) IMPLANT
SCREW LOCK RELINE 5.5 TULIP (Screw) IMPLANT
SCREW RELINE RED 6.5X45MM POLY (Screw) IMPLANT
SOLN STERILE WATER 500 ML (IV SOLUTION) IMPLANT
SOLN STERILE WATER BTL 1000 ML (IV SOLUTION) IMPLANT
SPACER HEDRON 18X45X11 15D (Spacer) IMPLANT
STAPLER SKIN PROX 35W (STAPLE) IMPLANT
SURGIFLO W/THROMBIN 8M KIT (HEMOSTASIS) ×1 IMPLANT
SUT STRATA 3-0 15 PS-2 (SUTURE) ×3 IMPLANT
SUT VIC AB 0 CT1 27XCR 8 STRN (SUTURE) ×2 IMPLANT
SUT VIC AB 2-0 CT1 18 (SUTURE) ×2 IMPLANT
SUTURE EHLN 3-0 FS-10 30 BLK (SUTURE) IMPLANT
SYR 30ML LL (SYRINGE) ×2 IMPLANT
TAPE CLOTH 3X10 WHT NS LF (GAUZE/BANDAGES/DRESSINGS) ×4 IMPLANT
TOWEL OR 17X26 4PK STRL BLUE (TOWEL DISPOSABLE) ×2 IMPLANT
TRAP FLUID SMOKE EVACUATOR (MISCELLANEOUS) ×1 IMPLANT
TRAY FOLEY SLVR 16FR LF STAT (SET/KITS/TRAYS/PACK) ×1 IMPLANT
TUBING CONNECTING 10 (TUBING) ×1 IMPLANT

## 2024-03-21 NOTE — H&P (Signed)
 "   Referring Physician:  Clois Fret, MD [address] [place],  [place] [address]  Primary Physician:  Justus Leita DEL, MD  History of Present Illness: 03/21/2024 [name] presents today with continued symptoms.  01/10/2024 Ms. Zani Kyllonen is here today with a chief complaint of chronic back pain and spinal stenosis who presents with worsening symptoms. She was referred by Dr. Myra for evaluation of her chronic back pain and spinal stenosis.  She has experienced significant back pain for the past ten years, initially affecting her left sciatic nerve. Pain management injections were effective until September, but the pain has worsened over the past two months. The pain is centered in her lower back, radiating down both legs, and is exacerbated by walking or standing for prolonged periods. She uses a walking stick for support and wears a brace when active. The pain is severe enough to prevent her from walking more than a few steps without significant discomfort.  She has fibromyalgia, which does not cause as much pain as her current back issues. She also has two degenerative discs, one in her neck and one in her lower back. She experiences balance issues, which she attributes to either her medication or pain. No numbness or tingling in her legs, but she reports discomfort in her hands due to arthritis, affecting her grip strength.  Her daily activities have been significantly impacted; she used to walk 4,000 to 6,000 steps daily but has been unable to maintain this due to her pain. She can stand for about an hour before the pain becomes unbearable, and she finds relief in her recliner with a heating pad.  She lives alone and has a supportive family, including a daughter and a niece who is a engineer, civil (consulting).  Bowel/Bladder Dysfunction: none  Conservative measures:  Physical therapy: Has participated in at [facility] over 2 years ago.  Multimodal medical therapy  including regular antiinflammatories: Celebrex , Hydrocodone , Valium   Injections:  11/22/2023: Left L3-4 ESI by Dr. Myra 10/12/2023: L5-S1 ESI by Dr. Myra 07/19/2023:  L5-S1 ESI by Dr. Myra  Past Surgery: no spinal surgeries   Carrie Myers has no symptoms of cervical myelopathy.  The symptoms are causing a significant impact on the patient's life.   I have utilized the care everywhere function in epic to review the outside records available from external health systems.   Review of Systems:  A 10 point review of systems is negative, except for the pertinent positives and negatives detailed in the HPI.  Past Medical History: Past Medical History:  Diagnosis Date   Allergy    Anxiety    Arthritis    neck, hands, lower back   Bilateral dry eyes    Chronic low back pain 12/23/2016   Coronary artery disease    DDD (degenerative disc disease), lumbar 10/02/2014   Dental crowns present    Implants, front flipper while waiting for other implants   Emphysema of lung (HCC)    Fibromyalgia    Fibromyalgia    Fracture of right foot 01/14/2015   GERD (gastroesophageal reflux disease)    History of Graves' disease    Hyperlipidemia    Hypertension    Hypothyroidism    Myocardial infarction (HCC) 04/22/2006   Osteoarthritis    Other shoulder lesions, right shoulder 04/17/2015   Pre-diabetes    Rheumatoid arthritis (HCC)    Sciatica, left side 01/01/2019   Sleep apnea    CPAP   Status post shoulder surgery 05/27/2015  Thyroid  disease    Vertigo    none for approx 9 yrs    Past Surgical History: Past Surgical History:  Procedure Laterality Date   ABDOMINAL HYSTERECTOMY     total   APPENDECTOMY     BREAST BIOPSY Right    neg   BREAST CYST ASPIRATION Left    neg   BREAST EXCISIONAL BIOPSY Right    BUNIONECTOMY Bilateral    CESAREAN SECTION     COLONOSCOPY  2010   normal   COLONOSCOPY WITH PROPOFOL  N/A 04/03/2018   Procedure: COLONOSCOPY WITH BIOPSIES;   Surgeon: Jinny Carmine, MD;  Location: [facility];  Service: Endoscopy;  Laterality: N/A;  sleep apnea   CORONARY ANGIOPLASTY WITH STENT PLACEMENT  04/2006   dental implant     [age]   DILATION AND CURETTAGE OF UTERUS     epidural steroid injection  2018   ESOPHAGOGASTRODUODENOSCOPY N/A 10/03/2020   Procedure: ESOPHAGOGASTRODUODENOSCOPY (EGD);  Surgeon: Maryruth Ole DASEN, MD;  Location: [facility];  Service: Endoscopy;  Laterality: N/A;  REQUESTING MORNING AFTER 9 AM   ESOPHAGOGASTRODUODENOSCOPY (EGD) WITH PROPOFOL  N/A 08/17/2022   Procedure: ESOPHAGOGASTRODUODENOSCOPY (EGD) WITH PROPOFOL ;  Surgeon: Maryruth Ole DASEN, MD;  Location: [facility] ENDOSCOPY;  Service: Endoscopy;  Laterality: N/A;   EYE SURGERY     bilateral cataract extraction   SHOULDER ARTHROSCOPY Right 05/07/2015   Procedure: Extensive arthroscopic debridement and arthroscopic subacromial decompression, right shoulder.                        2.  Steroid injection right thumb CMC joint.;  Surgeon: Norleen JINNY Maltos, MD;  Location: [facility];  Service: Orthopedics;  Laterality: Right;   TOOTH EXTRACTION     Upper centrals and lateral    Allergies: Allergies as of 01/10/2024 - Review Complete 12/26/2023  Allergen Reaction Noted   Amoxicillin-pot clavulanate Nausea And Vomiting and Other (See Comments) 10/02/2014   Codeine Nausea And Vomiting and Other (See Comments) 10/02/2014   Sulfa antibiotics Nausea And Vomiting 10/02/2014   Tolterodine  Diarrhea 01/19/2023   Proparacaine Itching 09/28/2017   Shellfish allergy Nausea And Vomiting 10/02/2014    Medications:  Current Facility-Administered Medications:    ceFAZolin  (ANCEF ) IVPB 2g/100 mL premix, 2 g, Intravenous, Once, Clois Fret, MD   lactated ringers  infusion, , Intravenous, Continuous, Chesley Lendia CROME, MD, Last Rate: 10 mL/hr at 03/21/24 9078, Continued from Pre-op at 03/21/24 9078   vancomycin  (VANCOCIN ) IVPB 1000 mg/200 mL premix, 1,000 mg,  Intravenous, Once, Clois Fret, MD  Social History: Social History   Tobacco Use   Smoking status: Former    Current packs/day: 0.00    Average packs/day: 1.3 packs/day for 44.0 years (55.0 ttl pk-yrs)    Types: Cigarettes    Start date: 04/22/1962    Quit date: 04/22/2006    Years since quitting: 17.9    Passive exposure: Never   Smokeless tobacco: Never   Tobacco comments:       Vaping Use   Vaping status: Never Used  Substance Use Topics   Alcohol use: Not Currently   Drug use: Never    Family Medical History: Family History  Problem Relation Age of Onset   Stroke Mother    Heart disease Father    Pancreatic cancer Sister    Diabetes Sister    Prostate cancer Brother    Breast cancer Neg Hx    Kidney cancer Neg Hx    Bladder Cancer Neg Hx  Physical Examination: Vitals:   03/21/24 0855 03/21/24 1022  BP: (!) 152/112 (!) 159/81  Pulse: 82   Resp: 16   Temp: (!) 97.3 F (36.3 C)   SpO2: 97%    Heart sounds normal no MRG. Chest Clear to Auscultation Bilaterally.  General: Patient is in no apparent distress. Attention to examination is appropriate.  Neck:   Supple.  Full range of motion.  Respiratory: Patient is breathing without any difficulty.   NEUROLOGICAL:     Awake, alert, oriented to person, place, and time.  Speech is clear and fluent.   Cranial Nerves: Pupils equal round and reactive to light.  Facial tone is symmetric.  Facial sensation is symmetric. Shoulder shrug is symmetric. Tongue protrusion is midline.  There is no pronator drift.  Strength: Side Biceps Triceps Deltoid Interossei Grip Wrist Ext. Wrist Flex.  R 5 5 5 5 5 5 5   L 5 5 5 5 5 5 5    Side Iliopsoas Quads Hamstring PF DF EHL  R 5 5 5 5 5 5   L 5 5 5 5 5 5    Reflexes are 1+ and symmetric at the biceps, triceps, brachioradialis, patella and achilles.   Hoffman's is absent.   Bilateral upper and lower extremity sensation is intact to light touch.    No evidence of  dysmetria noted.  Gait is antalgic.     Medical Decision Making  Imaging: MR L spine 05/24/2023  Disc levels:   T11-12: Disc space narrowing with small protrusion only covered on sagittal images. No impingement.   L1-L2: Mild disc desiccation, narrowing, and bulging. Mild facet spurring.   L2-L3: Mild disc bulging and facet spurring.   L3-L4: Mild disc narrowing with circumferential bulging. Mild degenerative facet spurring   L4-L5: Advanced facet osteoarthritis with spurring, ligamentum flavum thickening, anterolisthesis. The disc is narrowed and circumferentially bulging. Advanced spinal stenosis. Patent foramina.   L5-S1:Disc collapse and endplate degeneration that is progressed. The disc is circumferentially bulging and there is mild to moderate bilateral facet spurring. Mild left foraminal narrowing.   IMPRESSION: 1. Facet osteoarthritis focally advanced at L4-5 where there is anterolisthesis and advanced spinal stenosis that is progressed from 2011. 2. Focally advanced disc degeneration at L5-S1 with progression from 2011. Mild foraminal narrowing at this level. 3. Ectatic infrarenal aorta measuring up to 2.7 cm. Recommend follow-up ultrasound every 5 years. This recommendation follows ACR consensus guidelines: White Paper of the ACR Incidental Findings Committee II on Vascular Findings. J Am Coll Radiol 2013; 10:789-794.     Electronically Signed   By: Dorn Roulette M.D.   On: 06/10/2023 05:39    I have personally reviewed the images and agree with the above interpretation.  Assessment and Plan: Ms. Furey is a pleasant 79 y.o. female with worsening anterolisthesis at L4-5.  She has severe stenosis at L4-5.  She now has severe back pain as well as bilateral sciatica.  Her degree of stenosis is caused neurogenic claudication in addition to this.  In reviewing her old MRI scans, her findings have progressed over time.  We will proceed with L4-5  lateral lumbar interbody fusion with percutaneous fixation.      Kynslie Ringle K. Clois MD, [facility]  "

## 2024-03-21 NOTE — Anesthesia Preprocedure Evaluation (Addendum)
 "                                  Anesthesia Evaluation  Patient identified by MRN, date of birth, ID band Patient awake    Reviewed: Allergy & Precautions, H&P , NPO status , Patient's Chart, lab work & pertinent test results, reviewed documented beta blocker date and time   Airway Mallampati: III  TM Distance: >3 FB Neck ROM: full    Dental  (+) Teeth Intact, Poor Dentition, Missing   Pulmonary sleep apnea, Continuous Positive Airway Pressure Ventilation and Oxygen  sleep apnea , COPD,  COPD inhaler, former smoker   Pulmonary exam normal        Cardiovascular Exercise Tolerance: Poor hypertension, On Medications + CAD, + Past MI, + Cardiac Stents and + DOE  (-) Orthopnea and (-) PND Normal cardiovascular exam Rhythm:regular Rate:Normal  Cardiac clearance note appreciated and patient at moderate risk.  This was discussed with patient. ja   Neuro/Psych  PSYCHIATRIC DISORDERS Anxiety Depression     Neuromuscular disease    GI/Hepatic Neg liver ROS,GERD  Medicated,,  Endo/Other  Hypothyroidism    Renal/GU negative Renal ROS  negative genitourinary   Musculoskeletal  (+) Arthritis ,  Fibromyalgia -  Abdominal   Peds  Hematology negative hematology ROS (+)   Anesthesia Other Findings Past Medical History: No date: Allergy No date: Anxiety No date: Arthritis     Comment:  neck, hands, lower back No date: Bilateral dry eyes 12/23/2016: Chronic low back pain No date: Coronary artery disease 10/02/2014: DDD (degenerative disc disease), lumbar No date: Dental crowns present     Comment:  Implants, front flipper while waiting for other               implants No date: Emphysema of lung (HCC) No date: Fibromyalgia No date: Fibromyalgia 01/14/2015: Fracture of right foot No date: GERD (gastroesophageal reflux disease) No date: History of Graves' disease No date: Hyperlipidemia No date: Hypertension No date: Hypothyroidism 04/22/2006: Myocardial  infarction [facility]) No date: Osteoarthritis 04/17/2015: Other shoulder lesions, right shoulder No date: Pre-diabetes No date: Rheumatoid arthritis (HCC) 01/01/2019: Sciatica, left side No date: Sleep apnea     Comment:  CPAP 05/27/2015: Status post shoulder surgery No date: Thyroid  disease No date: Vertigo     Comment:  none for approx 9 yrs Past Surgical History: No date: ABDOMINAL HYSTERECTOMY     Comment:  total No date: APPENDECTOMY No date: BREAST BIOPSY; Right     Comment:  neg No date: BREAST CYST ASPIRATION; Left     Comment:  neg No date: BREAST EXCISIONAL BIOPSY; Right No date: BUNIONECTOMY; Bilateral No date: CESAREAN SECTION 2010: COLONOSCOPY     Comment:  normal 04/03/2018: COLONOSCOPY WITH PROPOFOL ; N/A     Comment:  Procedure: COLONOSCOPY WITH BIOPSIES;  Surgeon: Jinny Carmine, MD;  Location: [facility];  Service:               Endoscopy;  Laterality: N/A;  sleep apnea 04/2006: CORONARY ANGIOPLASTY WITH STENT PLACEMENT No date: dental implant     Comment:  [age] No date: DILATION AND CURETTAGE OF UTERUS 2018: epidural steroid injection 10/03/2020: ESOPHAGOGASTRODUODENOSCOPY; N/A     Comment:  Procedure: ESOPHAGOGASTRODUODENOSCOPY (EGD);  Surgeon:               Maryruth Smalls  T, MD;  Location: [facility] ENDOSCOPY;                Service: Endoscopy;  Laterality: N/A;  REQUESTING MORNING              AFTER 9 AM 08/17/2022: ESOPHAGOGASTRODUODENOSCOPY (EGD) WITH PROPOFOL ; N/A     Comment:  Procedure: ESOPHAGOGASTRODUODENOSCOPY (EGD) WITH               PROPOFOL ;  Surgeon: Maryruth Ole DASEN, MD;  Location:               [facility] END[facility];  Service: Endoscopy;  Laterality: N/A; No date: EYE SURGERY     Comment:  bilateral cataract extraction 05/07/2015: SHOULDER ARTHROSCOPY; Right     Comment:  Procedure: Extensive arthroscopic debridement and               arthroscopic subacromial decompression, right shoulder.                 2. Steroid injection right thumb               CMC joint.;  Surgeon: Norleen JINNY Maltos, MD;  Location: [facility];  Service: Orthopedics;  Laterality: Right; No date: TOOTH EXTRACTION     Comment:  Upper centrals and lateral BMI    Body Mass Index: 27.82 kg/m     Reproductive/Obstetrics negative OB ROS                              Anesthesia Physical Anesthesia Plan  ASA: 3  Anesthesia Plan: General ETT   Post-op Pain Management:    Induction:   PONV Risk Score and Plan: 4 or greater  Airway Management Planned:   Additional Equipment:   Intra-op Plan:   Post-operative Plan:   Informed Consent: I have reviewed the patients History and Physical, chart, labs and discussed the procedure including the risks, benefits and alternatives for the proposed anesthesia with the patient or authorized representative who has indicated his/her understanding and acceptance.     Dental Advisory Given  Plan Discussed with: CRNA  Anesthesia Plan Comments:          Anesthesia Quick Evaluation  "

## 2024-03-21 NOTE — Anesthesia Procedure Notes (Addendum)
 Procedure Name: Intubation Date/Time: 03/21/2024 11:07 AM  Performed by: Tymier Lindholm, CRNAPre-anesthesia Checklist: Patient identified, Emergency Drugs available, Suction available and Patient being monitored Patient Re-evaluated:Patient Re-evaluated prior to induction Oxygen  Delivery Method: Circle System Utilized Preoxygenation: Pre-oxygenation with 100% oxygen  Induction Type: IV induction Ventilation: Mask ventilation without difficulty Laryngoscope Size: Glidescope and 3 Grade View: Grade I Tube type: Oral Tube size: 7.0 mm Number of attempts: 1 Airway Equipment and Method: Stylet and Oral airway Placement Confirmation: ETT inserted through vocal cords under direct vision, positive ETCO2 and breath sounds checked- equal and bilateral Secured at: 22 cm Tube secured with: Tape Dental Injury: Teeth and Oropharynx as per pre-operative assessment  Comments: Easy,atraumatic intubation, lips, tongue and teeth unchanged. Head and neck midline.

## 2024-03-21 NOTE — Discharge Instructions (Signed)
 Your surgeon has performed an operation on your lumbar spine (low back) to relieve pressure on one or more nerves. Many times, patients feel better immediately after surgery and can "overdo it." Even if you feel well, it is important that you follow these activity guidelines. If you do not let your back heal properly from the surgery, you can increase the chance of hardware complications and/or return of your symptoms. The following are instructions to help in your recovery once you have been discharged from the hospital.  Do not use NSAIDs for 3 months after surgery.  *Regarding compression stockings-  Please wear day and night until you are walking a couple hundred feet three times a day.   Activity    No bending, lifting, or twisting ("BLT"). Avoid lifting objects heavier than 10 pounds (gallon milk jug).  Where possible, avoid household activities that involve lifting, bending, pushing, or pulling such as laundry, vacuuming, grocery shopping, and childcare. Try to arrange for help from friends and family for these activities while your back heals.  Increase physical activity slowly as tolerated.  Taking short walks is encouraged, but avoid strenuous exercise. Do not jog, run, bicycle, lift weights, or participate in any other exercises unless specifically allowed by your doctor. Avoid prolonged sitting, including car rides.  Talk to your doctor before resuming sexual activity.  You should not drive until cleared by your doctor.  Until released by your doctor, you should not return to work or school.  You should rest at home and let your body heal.   You may shower three days after your surgery.  After showering, lightly dab your incision dry. Do not take a tub bath or go swimming for 3 weeks, or until approved by your doctor at your follow-up appointment.  If you smoke, we strongly recommend that you quit.  Smoking has been proven to interfere with normal healing in your back and will  dramatically reduce the success rate of your surgery. Please contact [facility] (800-QUIT-NOW) and use the resources at www.[facility].com for assistance in stopping smoking.  Surgical Incision   If you have a dressing on your incision, you may remove it three days after your surgery. Keep your incision area clean and dry.  Your incision was closed with Dermabond glue. The glue should begin to peel away within about a week.  Diet            You may return to your usual diet. Be sure to stay hydrated.  When to Contact Us   Although your surgery and recovery will likely be uneventful, you may have some residual numbness, aches, and pains in your back and/or legs. This is normal and should improve in the next few weeks.  However, should you experience any of the following, contact us  immediately: New numbness or weakness Pain that is progressively getting worse, and is not relieved by your pain medications or rest Bleeding, redness, swelling, pain, or drainage from surgical incision Chills or flu-like symptoms Fever greater than 101.0 F (38.3 C) Problems with bowel or bladder functions Difficulty breathing or shortness of breath Warmth, tenderness, or swelling in your calf  Contact Information How to contact us :  If you have any questions/concerns before or after surgery, you can reach us  at [phone], or you can send a mychart message. We can be reached by phone or mychart 8am-4pm, Monday-Friday.  *Please note: Calls after 4pm are forwarded to a third party answering service. Mychart messages are not routinely monitored during evenings,  weekends, and holidays. Please call our office to contact the answering service for urgent concerns during non-business hours.

## 2024-03-21 NOTE — Op Note (Signed)
 Indications: [name] is a 79 y.o. female with M43.16 Spondylolisthesis of lumbar region, M54.42, M54.41, G89.29 Chronic bilateral low back pain with bilateral sciatica, M48.062 Neurogenic claudication due to lumbar spinal stenosis   Findings: expansion of disc space  Preoperative Diagnosis: M43.16 Spondylolisthesis of lumbar region, M54.42, M54.41, G89.29 Chronic bilateral low back pain with bilateral sciatica, M48.062 Neurogenic claudication due to lumbar spinal stenosis  Postoperative Diagnosis: same   EBL: 50 ml IVF: see anesthesia record Drains: none Disposition: Extubated and Stable to PACU Complications: none  A foley catheter was placed.   Preoperative Note:   Risks of surgery discussed include: infection, bleeding, stroke, coma, death, paralysis, CSF leak, nerve/spinal cord injury, numbness, tingling, weakness, complex regional pain syndrome, recurrent stenosis and/or disc herniation, vascular injury, development of instability, neck/back pain, need for further surgery, persistent symptoms, development of deformity, and the risks of anesthesia. The patient understood these risks and agreed to proceed.  NAME OF ANTERIOR PROCEDURE:               1. Anterior lumbar interbody fusion via a right lateral retroperitoneal approach at L4/5 2. Placement of a Lordotic Globus Hedron interbody cage, filled with Demineralized Bone Matrix  NAME OF POSTERIOR PROCEDURE 1. Posterior instrumentation using Nuvasive Reline Instrumentation 2. Posterolateral fusion, L4/5, utilizing demineralized bone matrix 3. Use of Stereotaxis   PROCEDURE:  Patient was brought to the operating room, intubated, turned to the lateral position.  All pressure points were checked and double-checked.  The patient was prepped and draped in the standard fashion. Prior to prepping, fluoroscopy was brought in and the patient was positioned with a large bump under the contralateral side between the iliac crest  and rib cage, allowing the area between the iliac crest and the lateral aspect of the rib cage to open and increase the ability to reach inferiorly, to facilitate entry into the disc space.  The incision was marked upon the skin both the location of the disc space as well as the superior most aspect of the iliac crest.  Based on the identification of the disc space an incision was prepared, marked upon the skin and eventually was used for our lateral incision.  The fluoroscopy was turned into a cross table A/P image in order to confirm that the patients spine remained in a perpendicular trajectory to the floor without rotation.  Once confirming that all the pressure points were checked and double-checked and the patient remained in sturdy position strapped down in this slightly jack-knifed lateral position, the patient was prepped and draped in standard fashion.  The skin was injected with local anesthetic, then incised until the abdominal wall fascia was noted.  I bluntly dissected posteriorly until we were able to identify the posterior musculature near petits triangle.  At this point, using primarily blunt dissection with our finger aided with a metzenbaum scissor, were able to enter the retroperitoneal cavity.  The retroperitoneal potential space was opened further until palpating out the psoas muscle, the medial aspect of the iliac crest, the medial aspect of the last rib and continued to define the retroperitoneal space with blunt dissection in order to facilitate safe placement of our dilators.    While protecting by dissecting directly onto a finger in the retroperitoneum, the retroperitoneal space was entered safely from the lateral incision and the initial dilator placed onto the muscle belly of the psoas.  While directly stimulating the dilator and after radiographically confirming our location relative to the disc space, I  placed the dilator through the psoas.  The dilators were stimulated to  ensure remaining safely away from any of the lumbar plexus nerves; the dilators were repositioned until no pathologic stimulation was appreciated.  Once I had confirmed the location of our initial dilator radiographically, a K-wire secured the dilator into the L4/5 disc space and confirmed position under A/P and lateral fluoroscopy.  At this point, I dilated up with direct stimulation to confirm lack of pathologic stimulation.  Once all the dilators were in position, I placed in the retractor and secured it onto the table, locked into position and confirmed under A/P and lateral fluoroscopy to confirm our approach angle to the disc space as well as location relative to the disc space.  I then placed the muscle stimulator in through the working channel down to the vertebral body, stimulating the entire lateral surface of the vertebral body and any of the visualized psoas muscle that was adjacent to the retractor, confirming again the safe passage to the psoas before we began performing the discectomy.  At this point, we began our discectomy at L4/5.  The disc was incised laterally throughout the extent of our exposure. Using a combination of pituitary rongeurs, Kerrison rongeurs, rasps, curettes of various sorts, we were able to begin to clean out the disc space.  Once we had cleaned out the majority of the disc space, we then cut the lateral annulus with a cob, breaking the lateral annual attachments on the contralateral side by subtly working the cob through the annulus while using flouroscopy.  Care was taken not to extend further than required after cutting the annular attachments.  After this had been performed, we prepared the endplates for placement of our graft, sized a graft to the disc space by serially dilating up in trial sizes until we confirmed that our graft would be well positioned, allowing distraction while maintaining good grip.  This was confirmed under A/P and lateral fluoroscopy in order to  ensure its placement as an eventual trial for placement of our final graft.  We irrigated with saline.  Once confirmed placement, the Hedron implant filled with allograft was impacted into position at L4/5.   Through a combination of intradiscal distraction and anterior releasing, we were able to correct the anterior deformity during disc preparation and placement of the graft.  At this point, final radiographs were performed, and we began closure.  The wound was closed using 0 Vicryl interrupted suture in the fascia and 2-0 Vicryl inverted suture were placed in the subcutaneous tissue and dermis. 3-0 monocryl was used for final closure. Dermabond was used to close the skin.    After closing the anterior part in layers, the patient was repositioned into prone position.  All pressure points were checked and double-checked.  The posterior operative site was prepped and draped in standard fashion.  The stereotactic array was placed.  Stereotactic images were acquired using intraoperative CT scanning.  This was registered to the patient.  Using stereotaxis, screw trajectories were planned and incisions made.  The pedicles from L4 to L5 were cannulated bilaterally and K wires used to secure the tracks.  We then utilized a stereotactic screwdriver to place pedicle screws from L4 to L5.  At each level, Nuvasive Reline pedicle screws were placed.  Once the screws were placed, the screw extensions were then linked, a path was formed for the rod and a rod was utilized to connect the screws.  We then compressed, torqued / counter-torqued  and removed the screw assembly. Once performed on each side, the C-arm was brought back and to take confirmatory CT scan showing appropriate placement of all instrumentation and anatomic alignment.    Posterolateral arthrodesis was performed at L4-L5 utilizing demineralized bone matrix.  We irrigated each incision and obtained hemostasis. Each wound was closed using 0 Vicryl  interrupted suture in the fascia, 2-0 Vicryl inverted suture were placed in the subcutaneous tissue and dermis. 3-0 monocryl was used for final closure. Dermabond was used to close the skin.    Needle, lap and all counts were correct at the end of the case.     Edsel Goods PA assisted in the entire procedure. An assistant was required for this procedure due to the complexity.  The assistant provided assistance in tissue manipulation and suction, and was required for the successful and safe performance of the procedure. I performed the critical portions of the procedure.   Reeves Daisy MD Neurosurgery

## 2024-03-21 NOTE — Anesthesia Postprocedure Evaluation (Signed)
"   Anesthesia Post Note  Patient: Carrie Myers  Procedure(s) Performed: ANTERIOR LATERAL LUMBAR FUSION WITH PERCUTANEOUS SCREW 1 LEVEL APPLICATION OF INTRAOPERATIVE CT SCAN  Patient location during evaluation: PACU Anesthesia Type: General Level of consciousness: awake and alert Pain management: pain level controlled Vital Signs Assessment: post-procedure vital signs reviewed and stable Respiratory status: spontaneous breathing, nonlabored ventilation, respiratory function stable and patient connected to nasal cannula oxygen  Cardiovascular status: blood pressure returned to baseline and stable Postop Assessment: no apparent nausea or vomiting Anesthetic complications: no   No notable events documented.   Last Vitals:  Vitals:   03/21/24 1445 03/21/24 1500  BP: 132/76 (!) 144/79  Pulse: 73 64  Resp: 15 14  Temp:    SpO2: 99% 99%    Last Pain:  Vitals:   03/21/24 1500  TempSrc:   PainSc: 10-Worst pain ever                 Carrie Myers      "

## 2024-03-21 NOTE — Transfer of Care (Signed)
 Immediate Anesthesia Transfer of Care Note  Patient: Carrie Myers  Procedure(s) Performed: ANTERIOR LATERAL LUMBAR FUSION WITH PERCUTANEOUS SCREW 1 LEVEL APPLICATION OF INTRAOPERATIVE CT SCAN  Patient Location: PACU  Anesthesia Type:General  Level of Consciousness: drowsy  Airway & Oxygen  Therapy: Patient Spontanous Breathing and Patient connected to face mask oxygen   Post-op Assessment: Report given to RN and Post -op Vital signs reviewed and stable  Post vital signs: Reviewed and stable  Last Vitals:  Vitals Value Taken Time  BP 153/82 03/21/24 13:40  Temp    Pulse 88 03/21/24 13:43  Resp 13 03/21/24 13:44  SpO2 100 % 03/21/24 13:43  Vitals shown include unfiled device data.  Last Pain:  Vitals:   03/21/24 0855  TempSrc: Temporal  PainSc: 8          Complications: No notable events documented.

## 2024-03-21 NOTE — Plan of Care (Signed)
" °  Problem: Education: Goal: Knowledge of the prescribed therapeutic regimen will improve Outcome: Progressing   Problem: Bowel/Gastric: Goal: Gastrointestinal status for postoperative course will improve Outcome: Progressing   Problem: Nutritional: Goal: Will attain and maintain optimal nutritional status Outcome: Progressing   Problem: Clinical Measurements: Goal: Ability to maintain clinical measurements within normal limits Outcome: Progressing   "

## 2024-03-22 ENCOUNTER — Other Ambulatory Visit: Payer: Self-pay

## 2024-03-22 ENCOUNTER — Other Ambulatory Visit: Payer: Self-pay | Admitting: Physician Assistant

## 2024-03-22 ENCOUNTER — Encounter: Payer: Self-pay | Admitting: Neurosurgery

## 2024-03-22 DIAGNOSIS — M4316 Spondylolisthesis, lumbar region: Secondary | ICD-10-CM | POA: Diagnosis not present

## 2024-03-22 MED ORDER — SENNA 8.6 MG PO TABS
1.0000 | ORAL_TABLET | Freq: Two times a day (BID) | ORAL | 0 refills | Status: AC
  Filled 2024-03-22: qty 120, 60d supply, fill #0

## 2024-03-22 MED ORDER — OXYCODONE HCL 5 MG PO TABS
5.0000 mg | ORAL_TABLET | ORAL | 0 refills | Status: DC | PRN
  Filled 2024-03-22: qty 30, 5d supply, fill #0

## 2024-03-22 MED ORDER — ONDANSETRON HCL 4 MG PO TABS
4.0000 mg | ORAL_TABLET | Freq: Four times a day (QID) | ORAL | 0 refills | Status: AC | PRN
  Filled 2024-03-22: qty 20, 5d supply, fill #0

## 2024-03-22 MED ORDER — ACETAMINOPHEN 500 MG PO TABS
1000.0000 mg | ORAL_TABLET | Freq: Four times a day (QID) | ORAL | 2 refills | Status: AC | PRN
  Filled 2024-03-22: qty 100, 13d supply, fill #0

## 2024-03-22 MED ORDER — METHOCARBAMOL 500 MG PO TABS
500.0000 mg | ORAL_TABLET | Freq: Four times a day (QID) | ORAL | 0 refills | Status: AC | PRN
  Filled 2024-03-22: qty 90, 23d supply, fill #0

## 2024-03-22 MED ORDER — CELECOXIB 100 MG PO CAPS
100.0000 mg | ORAL_CAPSULE | Freq: Two times a day (BID) | ORAL | 2 refills | Status: DC
  Filled 2024-03-22: qty 60, 30d supply, fill #0

## 2024-03-22 MED ORDER — CELECOXIB 100 MG PO CAPS
100.0000 mg | ORAL_CAPSULE | Freq: Two times a day (BID) | ORAL | 2 refills | Status: AC
  Filled 2024-03-22: qty 60, 30d supply, fill #0

## 2024-03-22 MED ORDER — DOCUSATE SODIUM 100 MG PO CAPS
100.0000 mg | ORAL_CAPSULE | Freq: Two times a day (BID) | ORAL | 0 refills | Status: AC
  Filled 2024-03-22: qty 10, 5d supply, fill #0

## 2024-03-22 NOTE — Progress Notes (Signed)
 DISCHARGE NOTE:  Pt and daughter Jon, given discharge instructions and verbalized understanding. TED hose on both legs, CPAP, walker and meds to beds medications sent with pt. Pt wheeled to car by staff, granddaughter providing transportation home.

## 2024-03-22 NOTE — Evaluation (Addendum)
 Physical Therapy Evaluation Patient Details Name: Carrie Myers MRN: [id number] DOB: 12-Dec-1945 Today's Date: 03/22/2024  History of Present Illness  Pt is a 79 year old female s/p L4-5 XLIF and PSF on 03/21/24    PMH significant for CAD status post PCI of LAD 2008 following NSTEMI, hypertension, hyperlipidemia, spinal stenosis, fibromyalgia  Clinical Impression  Patient noted to be in seated position at PT arrival in room, for an initial PT evaluation due to a decline in functional status, with baseline mobility reported as modI, and currently requiring minA for room ambulation. The patient is A&O x 4, presenting with good willingness to work with PT. The patient resides in a condo and lives alone with family/friend support. There is 1 STE inside the residence.Gait was assessed with RW. Gait mechanic observations noted unsteadiness and very antalgic; pt often breaks precautions by twisting several times during mobility. Pt educated to correct. The overall clinical impression is that the patient presents with moderate mobility limitations. Recommended skilled PT will address safety, mobility, and discharge planning.        If plan is discharge home, recommend the following: A little help with walking and/or transfers;A little help with bathing/dressing/bathroom;Help with stairs or ramp for entrance;Assist for transportation   Can travel by private vehicle        Equipment Recommendations BSC/3in1 (shower seat)  Recommendations for Other Services       Functional Status Assessment Patient has had a recent decline in their functional status and demonstrates the ability to make significant improvements in function in a reasonable and predictable amount of time.     Precautions / Restrictions Precautions Precautions: Fall Recall of Precautions/Restrictions: Intact Restrictions Weight Bearing Restrictions Per Provider Order: No      Mobility  Bed Mobility                     Transfers Overall transfer level: Needs assistance Equipment used: Rolling walker (2 wheels) Transfers: Sit to/from Stand Sit to Stand: Contact guard assist, Min assist                Ambulation/Gait Ambulation/Gait assistance: Min assist Gait Distance (Feet): 80 Feet Assistive device: Rolling walker (2 wheels) Gait Pattern/deviations: Step-through pattern Gait velocity: decreased     General Gait Details: unsteadiness  Stairs            Wheelchair Mobility     Tilt Bed    Modified Rankin (Stroke Patients Only)       Balance Overall balance assessment: Needs assistance Sitting-balance support: Feet supported Sitting balance-Leahy Scale: Good     Standing balance support: Bilateral upper extremity supported, During functional activity, Reliant on assistive device for balance Standing balance-Leahy Scale: Good                               Pertinent Vitals/Pain      Home Living Family/patient expects to be discharged to:: Private residence Living Arrangements: Alone Available Help at Discharge: Family;Available 24 hours/day (daugther/grand daugther/neice will be  staying with her for the next few weeks) Type of Home:  (condo) Home Access: Stairs to enter   Entrance Stairs-Number of Steps: 1 threshold in garage, 2 in front   Home Layout: One level Home Equipment: BSC/3in1 Additional Comments: walking stick    Prior Function Prior Level of Function : Independent/Modified Independent             Mobility Comments:  amb with walking stick community distances ADLs Comments: generally MOD I-I with ADL/IADL     Extremity/Trunk Assessment   Upper Extremity Assessment Upper Extremity Assessment: Overall WFL for tasks assessed (grossly 4+-5/5 throughout)    Lower Extremity Assessment Lower Extremity Assessment: Defer to PT evaluation    Cervical / Trunk Assessment Cervical / Trunk Assessment: Back Surgery  Communication    Communication Communication: Impaired Factors Affecting Communication: Hearing impaired (does not have hearing aids at hospital)    Cognition Arousal: Alert Behavior During Therapy: [facility] for tasks assessed/performed                             Following commands: Intact       Cueing Cueing Techniques: Verbal cues     General Comments General comments (skin integrity, edema, etc.): vss throughout, pt does endorse dizziness with initial STS, improves with time; BP 149/67 (90), spo2 100%, HR 65 bpm after mobility    Exercises     Assessment/Plan    PT Assessment Patient needs continued PT services  PT Problem List Decreased activity tolerance;Decreased mobility;Decreased strength       PT Treatment Interventions Gait training;Functional mobility training;Therapeutic activities;Neuromuscular re-education;Balance training;Therapeutic exercise;Patient/family education    PT Goals (Current goals can be found in the Care Plan section)  Acute Rehab PT Goals Patient Stated Goal: Pt wants to go home but is not sure about support at home PT Goal Formulation: With patient Time For Goal Achievement: 04/12/24 Potential to Achieve Goals: Good    Frequency Min 2X/week     Co-evaluation               AM-PAC PT 6 Clicks Mobility  Outcome Measure Help needed turning from your back to your side while in a flat bed without using bedrails?: None Help needed moving from lying on your back to sitting on the side of a flat bed without using bedrails?: A Little Help needed moving to and from a bed to a chair (including a wheelchair)?: A Little Help needed standing up from a chair using your arms (e.g., wheelchair or bedside chair)?: A Little Help needed to walk in hospital room?: A Little Help needed climbing 3-5 steps with a railing? : A Little 6 Click Score: 19    End of Session   Activity Tolerance: Patient tolerated treatment well Patient left: in chair;with call  bell/phone within reach Nurse Communication: Mobility status PT Visit Diagnosis: Other abnormalities of gait and mobility (R26.89);Difficulty in walking, not elsewhere classified (R26.2)    Time: 8981-8963 PT Time Calculation (min) (ACUTE ONLY): 18 min   Charges:   PT Evaluation $PT Eval Low Complexity: 1 Low   PT General Charges $$ ACUTE PT VISIT: 1 Visit         Sherlean Lesches DPT, PT    Sherlean A Deandre Brannan 03/22/2024, 10:47 AM

## 2024-03-22 NOTE — Progress Notes (Signed)
 Physical Therapy Treatment Patient Details Name: Carrie Myers MRN: [id number] DOB: 1946-01-04 Today's Date: 03/22/2024   History of Present Illness Pt is a 79 year old female s/p L4-5 XLIF and PSF on 03/21/24    PMH significant for CAD status post PCI of LAD 2008 following NSTEMI, hypertension, hyperlipidemia, spinal stenosis, fibromyalgia    PT Comments  Patient seen for PT session focused on patient education, family/caregiver education and functional mobility training. Patient required min/CGA for 80' feet of ambulation and used RW . Tolerated session well  with no signs of exertion. Interventions aimed at improving reducing pt and caregiver stress to prepare to d/c. Patient shows good potential to make progress. Pt making good progress toward goals. Discharge recommendation remains appropriate     If plan is discharge home, recommend the following: A little help with walking and/or transfers;A little help with bathing/dressing/bathroom;Help with stairs or ramp for entrance;Assist for transportation   Can travel by private vehicle        Equipment Recommendations  Rolling walker (2 wheels)    Recommendations for Other Services       Precautions / Restrictions Precautions Precautions: Fall Recall of Precautions/Restrictions: Intact Restrictions Weight Bearing Restrictions Per Provider Order: No     Mobility  Bed Mobility                    Transfers Overall transfer level: Needs assistance Equipment used: Rolling walker (2 wheels) Transfers: Sit to/from Stand Sit to Stand: Contact guard assist, Min assist                Ambulation/Gait Ambulation/Gait assistance: Min assist Gait Distance (Feet): 80 Feet Assistive device: Rolling walker (2 wheels) Gait Pattern/deviations: Step-through pattern Gait velocity: decreased     General Gait Details: unsteadiness   Stairs             Wheelchair Mobility     Tilt Bed    Modified Rankin  (Stroke Patients Only)       Balance Overall balance assessment: Needs assistance Sitting-balance support: Feet supported Sitting balance-Leahy Scale: Good     Standing balance support: Bilateral upper extremity supported, During functional activity, Reliant on assistive device for balance Standing balance-Leahy Scale: Good                              Communication Communication Communication: Impaired Factors Affecting Communication: Hearing impaired  Cognition Arousal: Alert Behavior During Therapy: WFL for tasks assessed/performed                             Following commands: Intact      Cueing Cueing Techniques: Verbal cues  Exercises      General Comments General comments (skin integrity, edema, etc.): vss throughout, pt does endorse dizziness with initial STS, improves with time; BP 149/67 (90), spo2 100%, HR 65 bpm after mobility      Pertinent Vitals/Pain Pain Assessment Pain Assessment: Faces Pain Score: 7  Faces Pain Scale: Hurts even more Pain Location: low back Pain Descriptors / Indicators: Sharp Pain Intervention(s): Monitored during session, Premedicated before session    Home Living Family/patient expects to be discharged to:: Private residence Living Arrangements: Alone Available Help at Discharge: Family;Available 24 hours/day (daugther/grand daugther/neice will be  staying with her for the next few weeks) Type of Home:  (condo) Home Access: Stairs to enter   [employer]  of Steps: 1 threshold in garage, 2 in front   Home Layout: One level Home Equipment: BSC/3in1 Additional Comments: walking stick    Prior Function            PT Goals (current goals can now be found in the care plan section) Acute Rehab PT Goals Patient Stated Goal: Pt wants to go home PT Goal Formulation: With patient Time For Goal Achievement: 04/12/24 Potential to Achieve Goals: Good Progress towards PT goals: Progressing  toward goals    Frequency    Min 2X/week      PT Plan      Co-evaluation              AM-PAC PT 6 Clicks Mobility   Outcome Measure  Help needed turning from your back to your side while in a flat bed without using bedrails?: None Help needed moving from lying on your back to sitting on the side of a flat bed without using bedrails?: A Little Help needed moving to and from a bed to a chair (including a wheelchair)?: A Little Help needed standing up from a chair using your arms (e.g., wheelchair or bedside chair)?: A Little Help needed to walk in hospital room?: A Little Help needed climbing 3-5 steps with a railing? : A Little 6 Click Score: 19    End of Session   Activity Tolerance: Patient tolerated treatment well Patient left: in chair;with call bell/phone within reach Nurse Communication: Mobility status PT Visit Diagnosis: Other abnormalities of gait and mobility (R26.89);Difficulty in walking, not elsewhere classified (R26.2)     Time: 8699-8681 PT Time Calculation (min) (ACUTE ONLY): 18 min  Charges:    $Therapeutic Activity: 8-22 mins PT General Charges $$ ACUTE PT VISIT: 1 Visit                     Carrie Lesches DPT, PT     Carrie Myers 03/22/2024, 1:23 PM

## 2024-03-22 NOTE — TOC Initial Note (Addendum)
 Transition of Care [facility]) - Initial/Assessment Note    Patient Details  Name: Carrie Myers MRN: [id number] Date of Birth: 29-Jan-1946  Transition of Care [facility]) CM/SW Contact:    [name], RN Phone Number: 03/22/2024, 11:22 AM  Clinical Narrative:                 RNCM met with patient at bedside. RNCM introduced myself and my role and explained that discharge planning would be discussed.Patient wasn't sure if HH/PT had been set up yet. States that she needs a RW. RW ordered via [employer] to be delivered to bedside. Asked if [facility] hadn't been set up that set up by the MD's office that she would like for me to submit referrals for her since she isn't familiar with the agencies in the area. Her granddaughter will be picking her up at the time of discharge and will be staying with her until her niece that's a nurse gets here to stay with her.   RNCM called Dr. Elliot office @ [phone] and spoke to [place]. She confirmed that HH/PT was not set up for the patient prior to surgery. RNCM sent out referral's for [facility] and will review offers with the patient once they come in. RNCM will continue to follow for discharge planning needs.    12:00 pm: RNCM reviewed HH offers with her, her daughter and 2 granddaughters at bedside. Patient choose [facility].   Expected Discharge Plan: Home w Home Health Services Barriers to Discharge: Continued Medical Work up   Patient Goals and CMS Choice            Expected Discharge Plan and Services                         DME Arranged: Walker rolling DME Agency: [employer] Date DME Agency Contacted: 03/22/24   Representative spoke with at DME Agency: [facility] HH Arranged: PT, OT HH Agency: Other - See comment (Referral's sent via EPIC) Date HH Agency Contacted: 03/22/24   Representative spoke with at [facility] Agency: Referral's sent via EPIC  Prior Living Arrangements/Services   Lives with:: Self Patient language and need for  interpreter reviewed:: Yes Do you feel safe going back to the place where you live?: Yes      Need for Family Participation in Patient Care: Yes (Comment) Care giver support system in place?: Yes (comment) Current home services: DME Criminal Activity/Legal Involvement Pertinent to Current Situation/Hospitalization: No - Comment as needed  Activities of Daily Living   ADL Screening (condition at time of admission) Independently performs ADLs?: Yes (appropriate for developmental age) Is the patient deaf or have difficulty hearing?: Yes (Hearing aids at home) Does the patient have difficulty seeing, even when wearing glasses/contacts?: No Does the patient have difficulty concentrating, remembering, or making decisions?: No  Permission Sought/Granted                  Emotional Assessment Appearance:: Appears stated age, Well-Groomed Attitude/Demeanor/Rapport: Engaged, Self-Confident, Gracious   Orientation: : Oriented to Self, Oriented to Place, Oriented to  Time, Oriented to Situation Alcohol / Substance Use: Not Applicable Psych Involvement: No (comment)  Admission diagnosis:  Spondylolisthesis of lumbar region [M43.16] Chronic left-sided low back pain with bilateral sciatica [M54.41, M54.42, G89.29] Neurogenic claudication due to lumbar spinal stenosis [F51.937] S/P lumbar fusion [Z98.1] Patient Active Problem List   Diagnosis Date Noted   Spondylolisthesis of lumbar region 03/21/2024   Neurogenic claudication due to  lumbar spinal stenosis 03/21/2024   Chronic bilateral low back pain with bilateral sciatica 03/21/2024   S/P lumbar fusion 03/21/2024   Right foot pain 11/01/2023   Arthralgia of left ankle 10/20/2021   Osteopenia determined by x-ray 03/31/2021   Primary osteoarthritis of left knee 01/22/2021   Effusion, right knee 12/15/2020   Primary osteoarthritis of right knee 11/24/2020   Overactive bladder 06/18/2020   Prediabetes 04/14/2020   Slow transit  constipation 06/05/2019   Sciatica, left side 01/01/2019   Diverticulosis of colon 04/25/2018   Change in bowel habits    Tobacco use disorder, moderate, in sustained remission 12/22/2017   Moderate episode of recurrent major depressive disorder (HCC) 10/03/2017   Inflammatory spondylopathy of lumbar region 10/03/2017   Hoarseness, persistent 04/18/2017   OSA on CPAP 02/18/2017   Cervical disc disease 12/31/2016   Generalized osteoarthritis 12/23/2016   Chronic pain syndrome 12/23/2016   Cystocele, midline 12/06/2016   Urinary incontinence 12/06/2016   Impingement syndrome of right shoulder 04/17/2015   Fibrocystic breast 01/18/2015   Gastroesophageal reflux disease without esophagitis 01/18/2015   Hypothyroidism, postablative 01/18/2015   Arteriosclerosis of coronary artery 12/29/2014   DJD of shoulder 11/13/2014   Fibromyalgia 10/02/2014   Intercostal neuralgia 10/02/2014   Facet syndrome, lumbar 10/02/2014   Sacroiliac joint disease 10/02/2014   Carotid artery narrowing 07/09/2014   Essential hypertension 07/01/2014   Osteoarthritis of thumb 09/24/2012   Mixed hyperlipidemia 09/22/2012   Arthritis of hand, degenerative 09/22/2012   PCP:  Justus Leita DEL, MD Pharmacy:   [facility] - [place], [place] - [address] [place] [address] Phone: [phone] Fax: [phone]  [facility]) - [place], [place] - [address] [facility] [address] [place] [address] [place] [address] Phone: [phone] Fax: [phone]  [facility] - [place], [place] - [address] [place] [address] Phone: [phone] Fax: [phone]  [facility] [address] [place] [address] Phone: [phone] Fax: [phone]     Social Drivers of Health (SDOH) Social History: SDOH Screenings   Food Insecurity: No Food Insecurity  (03/21/2024)  Housing: Low Risk (03/21/2024)  Transportation Needs: No Transportation Needs (03/21/2024)  Utilities: Not At Risk (03/21/2024)  Alcohol Screen: Low Risk (07/14/2023)  Depression (PHQ2-9): Low Risk (11/28/2023)  Financial Resource Strain: Low Risk  (02/29/2024)   Received from [facility]  Physical Activity: Insufficiently Active (07/14/2023)  Social Connections: Moderately Isolated (03/21/2024)  Stress: Stress Concern Present (07/14/2023)  Tobacco Use: Medium Risk (03/21/2024)  Health Literacy: Adequate Health Literacy (07/14/2023)   SDOH Interventions:     Readmission Risk Interventions     No data to display

## 2024-03-22 NOTE — Care Management CC44 (Signed)
"         Condition Code 44 Documentation Completed  Patient Details  Name: Carrie Myers MRN: [id number] Date of Birth: [birth year]   Condition Code 44 given:  Yes  Patient signature on Condition Code 44 notice:  Yes  Documentation of 2 MD's agreement:  Yes  Code 44 added to claim:   Yes    [name], RN 03/22/2024, 4:13 PM  "

## 2024-03-22 NOTE — Evaluation (Signed)
 Occupational Therapy Evaluation Patient Details Name: Carrie Myers MRN: [id number] DOB: [birth year] Today's Date: 03/22/2024   History of Present Illness   Pt is a 79 year old female s/p L4-5 XLIF and PSF on 03/21/24    PMH significant for CAD status post PCI of LAD 2008 following NSTEMI, hypertension, hyperlipidemia, spinal stenosis, fibromyalgia     Clinical Impressions Pt seen for OT evaluation this date, POD#1 from above procedure. PTA Pt is generally MOD I-I in ADL, amb with walking stick community distances. Pt educated in precautions, self care skills, AE, and home/routines modifications to maximize safety and functional independence while minimizing falls risk and maintaining precautions. Pt verbalized understanding of all education/training provided. Able to return demonstration safe techniques while maintaining precautions throughout. Handout provided to support recall and carry over of learned precautions/techniques for bed mobility, functional transfers, and self care skills. OT will follow acutely to facilitate optimal ADL/functional mobility performance. Pt is left in bedside chair, all needs met.  vss throughout, pt does endorse dizziness with initial STS, improves with time; BP 149/67 (90), spo2 100%, HR 65 bpm after mobility    If plan is discharge home, recommend the following:   A little help with bathing/dressing/bathroom;Assist for transportation;Assistance with cooking/housework     Functional Status Assessment   Patient has had a recent decline in their functional status and demonstrates the ability to make significant improvements in function in a reasonable and predictable amount of time.     Equipment Recommendations   Other (comment);Tub/shower seat (2WW)     Recommendations for Other Services         Precautions/Restrictions   Precautions Precautions: Fall Recall of Precautions/Restrictions: Intact Required Braces or Orthoses:  (no brace  needed) Restrictions Weight Bearing Restrictions Per Provider Order: No     Mobility Bed Mobility Overal bed mobility: Needs Assistance Bed Mobility: Rolling, Sidelying to Sit Rolling: Supervision Sidelying to sit: Supervision, HOB elevated, Used rails       General bed mobility comments: frequent vcs for log roll technique    Transfers Overall transfer level: Needs assistance Equipment used: Rolling walker (2 wheels) Transfers: Sit to/from Stand Sit to Stand: Contact guard assist, Min assist (from regular bed height)                  Balance Overall balance assessment: Needs assistance Sitting-balance support: Feet supported Sitting balance-Leahy Scale: Good     Standing balance support: Bilateral upper extremity supported, During functional activity, Reliant on assistive device for balance Standing balance-Leahy Scale: Good                             ADL either performed or assessed with clinical judgement   ADL Overall ADL's : Needs assistance/impaired Eating/Feeding: Set up;Sitting   Grooming: Supervision/safety;Standing;Wash/dry hands Grooming Details (indicate cue type and reason): with RW at sink level             Lower Body Dressing: Maximal assistance;Sitting/lateral leans Lower Body Dressing Details (indicate cue type and reason): donn underwear and pants, educated pt re: use of reacher for LB dressing Toilet Transfer: Contact guard assist;Minimal assistance;Rolling walker (2 wheels);Ambulation;Cueing for sequencing Toilet Transfer Details (indicate cue type and reason): to lower toilet height, anticipate CGA with use of bsc Toileting- Clothing Manipulation and Hygiene: Supervision/safety;Sitting/lateral lean Toileting - Clothing Manipulation Details (indicate cue type and reason): continent urine on toilet     Functional mobility during ADLs: Rolling walker (  2 wheels);Supervision/safety;Contact guard assist (approx 15' two attempts;  CGA, progress to supervision, intermittent vcs for RW technique)       Vision Patient Visual Report: No change from baseline       Perception         Praxis         Pertinent Vitals/Pain Pain Assessment Pain Assessment: 0-10 Pain Score: 9  Pain Descriptors / Indicators: Sharp Pain Intervention(s): Limited activity within patient's tolerance, Monitored during session, Repositioned, Premedicated before session     Extremity/Trunk Assessment Upper Extremity Assessment Upper Extremity Assessment: Overall WFL for tasks assessed (grossly 4+-5/5 throughout)   Lower Extremity Assessment Lower Extremity Assessment: Defer to PT evaluation   Cervical / Trunk Assessment Cervical / Trunk Assessment: Back Surgery   Communication Communication Communication: Impaired Factors Affecting Communication: Hearing impaired (does not have hearing aids at hospital)   Cognition Arousal: Alert Behavior During Therapy: [facility] for tasks assessed/performed Cognition: No apparent impairments                               Following commands: Intact       Cueing  General Comments   Cueing Techniques: Verbal cues  vss throughout, pt does endorse dizziness with initial STS, improves with time; BP 149/67 (90), spo2 100%, HR 65 bpm after mobility   Exercises     Shoulder Instructions      Home Living Family/patient expects to be discharged to:: Private residence Living Arrangements: Alone Available Help at Discharge: Family;Available 24 hours/day (daugther/grand daugther/neice will be  staying with her for the next few weeks) Type of Home:  (condo) Home Access: Stairs to enter Entrance Stairs-Number of Steps: 1 threshold in garage, 2 in front   Home Layout: One level     Bathroom Shower/Tub: Walk-in shower (with threshold)   Bathroom Toilet: Handicapped height     Home Equipment: BSC/3in1   Additional Comments: walking stick      Prior Functioning/Environment  Prior Level of Function : Independent/Modified Independent             Mobility Comments: amb with walking stick community distances ADLs Comments: generally MOD I-I with ADL/IADL    OT Problem List: Decreased strength;Decreased activity tolerance;Impaired balance (sitting and/or standing);Decreased knowledge of precautions;Decreased safety awareness;Decreased knowledge of use of DME or AE   OT Treatment/Interventions: Self-care/ADL training;DME and/or AE instruction;Therapeutic activities;Balance training;Therapeutic exercise;Energy conservation;Patient/family education      OT Goals(Current goals can be found in the care plan section)   Acute Rehab OT Goals Patient Stated Goal: go home OT Goal Formulation: With patient Time For Goal Achievement: 04/05/24 Potential to Achieve Goals: Good ADL Goals Pt Will Perform Grooming: with modified independence;sitting;standing Pt Will Perform Lower Body Dressing: with modified independence;sit to/from stand;sitting/lateral leans Pt Will Transfer to Toilet: with modified independence;ambulating Pt Will Perform Toileting - Clothing Manipulation and hygiene: with modified independence;sitting/lateral leans;sit to/from stand   OT Frequency:  Min 2X/week    Co-evaluation              AM-PAC OT 6 Clicks Daily Activity     Outcome Measure Help from another person eating meals?: None Help from another person taking care of personal grooming?: None Help from another person toileting, which includes using toliet, bedpan, or urinal?: None Help from another person bathing (including washing, rinsing, drying)?: A Lot Help from another person to put on and taking off regular upper body clothing?: None Help  from another person to put on and taking off regular lower body clothing?: A Lot 6 Click Score: 20   End of Session Equipment Utilized During Treatment: Rolling walker (2 wheels) Nurse Communication: Mobility status (pain)  Activity  Tolerance: Patient tolerated treatment well Patient left: in chair;with call bell/phone within reach  OT Visit Diagnosis: Other abnormalities of gait and mobility (R26.89);Muscle weakness (generalized) (M62.81)                Time: 9150-9064 OT Time Calculation (min): 46 min Charges:  OT General Charges $OT Visit: 1 Visit OT Evaluation $OT Eval Moderate Complexity: 1 Mod  Therisa Sheffield, OTD OTR/L  03/22/2024, 11:04 AM

## 2024-03-22 NOTE — Anesthesia Postprocedure Evaluation (Signed)
"   Anesthesia Post Note  Patient: Carrie Myers  Procedure(s) Performed: ANTERIOR LATERAL LUMBAR FUSION WITH PERCUTANEOUS SCREW 1 LEVEL APPLICATION OF INTRAOPERATIVE CT SCAN  Patient location during evaluation: PACU Anesthesia Type: General Level of consciousness: awake and alert Pain management: pain level controlled Vital Signs Assessment: post-procedure vital signs reviewed and stable Respiratory status: spontaneous breathing, nonlabored ventilation, respiratory function stable and patient connected to nasal cannula oxygen  Cardiovascular status: blood pressure returned to baseline and stable Postop Assessment: no apparent nausea or vomiting Anesthetic complications: no   No notable events documented.   Last Vitals:  Vitals:   03/21/24 2300 03/22/24 0457  BP: 133/68 135/72  Pulse: 83 63  Resp: 18 18  Temp: 36.6 C 36.8 C  SpO2: 94% 96%    Last Pain:  Vitals:   03/22/24 0457  TempSrc: Oral  PainSc:                  [place] KANDICE Clause      "

## 2024-03-22 NOTE — Care Management Important Message (Signed)
 Important Message  Patient Details  Name: Carrie Myers MRN: [id number] Date of Birth: Nov 23, 1945   Important Message Given:  Yes      [name], RN 03/22/2024, 4:14 PM

## 2024-03-22 NOTE — TOC CM/SW Note (Signed)
 Rolling Walker: The beneficiary has a mobility limitation that significantly impairs his/her ability to participate in one or more mobility-related activities of daily living (MRADL) in the home. The patient is able to safely use the walker. The functional mobility deficit can be sufficiently resolved by use of walker.

## 2024-03-22 NOTE — Progress Notes (Signed)
" ° °  Neurosurgery Progress Note  History: Carrie Myers is s/p L4-5 XLIF and PSF  POD1: Pt doing well this morning despite expected back soreness and right groin pain with movement. She feels her pain is largely controlled on PO medications.   Physical Exam: Vitals:   03/21/24 2300 03/22/24 0457  BP: 133/68 135/72  Pulse: 83 63  Resp: 18 18  Temp: 97.8 F (36.6 C) 98.2 F (36.8 C)  SpO2: 94% 96%    AA Ox3 CNI  Strength:5/5 throughout BLE   Incisions: c/d/I with clean dressings in place.   Data:  Other tests/results: NA  Assessment/Plan:  Carrie Myers is a 79 y.o presenting with lumbar spondylolisthesis and bilateral sciatica and neurogenic claudication s/p L4-5 XLIF and PSF  - mobilize - pain control - DVT prophylaxis - PTOT; dispo planning underway  Edsel Goods PA-C Department of Neurosurgery    "

## 2024-03-23 NOTE — Discharge Summary (Signed)
 Physician Discharge Summary  Patient ID: Carrie Myers MRN: [id number] DOB/AGE: [birth year] 77 y.o.  Admit date: 03/21/2024 Discharge date: 03/22/2024  Admission Diagnoses: Principal Problem:   S/P lumbar fusion Active Problems:   Spondylolisthesis of lumbar region   Neurogenic claudication due to lumbar spinal stenosis   Chronic bilateral low back pain with bilateral sciatica  Discharge Diagnoses:  Principal Problem:   S/P lumbar fusion Active Problems:   Spondylolisthesis of lumbar region   Neurogenic claudication due to lumbar spinal stenosis   Chronic bilateral low back pain with bilateral sciatica   Discharged Condition: good  Hospital Course: Carrie Myers presented to the hospital with the diagnoses above.  She underwent elective surgical intervention.  She was admitted overnight and worked with physical and Occupational Therapy.  She was found to be stable for discharge on postoperative day 1.  Consults: None  Significant Diagnostic Studies: radiology: X-Ray: correct placement of implants  Treatments: surgery: L4-5 XLIF PSF  Discharge Exam: Blood pressure 136/73, pulse 81, temperature 97.7 F (36.5 C), temperature source Oral, resp. rate 18, height 5' 8 (1.727 m), weight 83 kg, SpO2 96%. General appearance: alert and cooperative CNI MAEW  Disposition: Discharge disposition: 01-Home or Self Care       Discharge Instructions     Incentive spirometry RT   Complete by: As directed       Allergies as of 03/22/2024       Reactions   Amoxicillin-pot Clavulanate Nausea And Vomiting, Other (See Comments)   Codeine Nausea And Vomiting, Other (See Comments)   Sulfa Antibiotics Nausea And Vomiting   Tolterodine  Diarrhea   Proparacaine Itching   Shellfish Allergy Nausea And Vomiting   No issues with iodine        Medication List     STOP taking these medications    diclofenac  75 MG EC tablet Commonly known as: VOLTAREN     HYDROcodone -acetaminophen  5-325 MG tablet Commonly known as: NORCO/VICODIN       TAKE these medications    Acetaminophen  Extra Strength 500 MG Tabs Commonly known as: TYLENOL  Take 2 tablets (1,000 mg total) by mouth every 6 (six) hours as needed.   albuterol  108 (90 Base) MCG/ACT inhaler Commonly known as: VENTOLIN  HFA Inhale into the lungs. What changed:  how much to take when to take this reasons to take this   Armodafinil  150 MG tablet Take 150 mg by mouth daily.   aspirin EC 81 MG tablet Take 81 mg by mouth daily.   cetirizine 10 MG tablet Commonly known as: ZYRTEC Take 10 mg by mouth daily.   chlorhexidine  4 % external liquid Commonly known as: HIBICLENS  Apply 15 mLs (1 Application total) topically as directed for 30 doses. Use as directed daily for 5 days every other week for 6 weeks.   clobetasol  ointment 0.05 % Commonly known as: TEMOVATE  Apply to affected area once weekly as maintenance   diazepam  5 MG tablet Commonly known as: VALIUM  Take 1 tablet (5 mg total) by mouth at bedtime as needed for anxiety.   diphenhydramine -acetaminophen  25-500 MG Tabs tablet Commonly known as: TYLENOL  PM Take 1 tablet by mouth at bedtime as needed (sleep).   docusate sodium  100 MG capsule Commonly known as: COLACE Take 1 capsule (100 mg total) by mouth 2 (two) times daily.   DULoxetine  60 MG capsule Commonly known as: CYMBALTA  Take 60 mg by mouth daily.   levothyroxine  88 MCG tablet Commonly known as: SYNTHROID  TAKE 1 TABLET BY MOUTH DAILY  losartan  100 MG tablet Commonly known as: COZAAR  Take 100 mg by mouth daily.   methocarbamol  500 MG tablet Commonly known as: ROBAXIN  Take 1 tablet (500 mg total) by mouth every 6 (six) hours as needed for muscle spasms.   Multi-Vitamins Tabs Take 1 tablet by mouth daily.   mupirocin  ointment 2 % Commonly known as: BACTROBAN  Place 1 Application into the nose 2 (two) times daily for 60 doses. Use as directed 2 times  daily for 5 days every other week for 6 weeks.   NON FORMULARY at bedtime. CPAP @ 11 cm H2O with Oxygen    ondansetron  4 MG tablet Commonly known as: ZOFRAN  Take 1 tablet (4 mg total) by mouth every 6 (six) hours as needed for nausea or vomiting.   oxyCODONE  5 MG immediate release tablet Commonly known as: Oxy IR/ROXICODONE  Take 1 tablet (5 mg total) by mouth every 4 (four) hours as needed for moderate pain (pain score 4-6).   OXYGEN  Inhale into the lungs at bedtime.   pravastatin  80 MG tablet Commonly known as: PRAVACHOL  Take 80 mg by mouth at bedtime.   senna 8.6 MG Tabs tablet Commonly known as: SENOKOT Take 1 tablet (8.6 mg total) by mouth 2 (two) times daily.       ASK your doctor about these medications    celecoxib  100 MG capsule Commonly known as: CeleBREX  Take 1 capsule (100 mg total) by mouth 2 (two) times daily.        Contact information for after-discharge care     Home Medical Care     [facility] [place] [facility]) .   Service: Home Health Services Contact information: [address] [place]  [address] [phone]                     Signed: REEVES DAISY 03/23/2024, 9:15 AM

## 2024-03-30 ENCOUNTER — Telehealth: Payer: Self-pay | Admitting: Neurosurgery

## 2024-03-30 DIAGNOSIS — Z981 Arthrodesis status: Secondary | ICD-10-CM

## 2024-03-30 DIAGNOSIS — M4316 Spondylolisthesis, lumbar region: Secondary | ICD-10-CM

## 2024-03-30 MED ORDER — OXYCODONE HCL 5 MG PO TABS: 5.0000 mg | ORAL_TABLET | Freq: Four times a day (QID) | ORAL | 0 refills | Status: DC | PRN

## 2024-03-30 NOTE — Progress Notes (Unsigned)
" ° °  REFERRING PHYSICIAN:  Justus Leita DEL, Md [address] [place],  [place] [address]  DOS: 03/21/24  XLIF/PSF L4-L5  HISTORY OF PRESENT ILLNESS: Carrie Myers is approximately 2 weeks status post above surgery. Was given oxycodone  and robaxin  on discharge from the hospital.   Preop op LBP and bilateral leg pain worse with walking***    PHYSICAL EXAMINATION:  General: Patient is well developed, well nourished, calm, collected, and in no apparent distress.   NEUROLOGICAL:  General: In no acute distress.   Awake, alert, oriented to person, place, and time.  Pupils equal round and reactive to light.  Facial tone is symmetric.     Strength:          Side Iliopsoas Quads Hamstring PF DF EHL  R 5 5 5 5 5 5   L 5 5 5 5 5 5    Incisions c/d/i   ROS (Neurologic):  Negative except as noted above  IMAGING: Nothing new to review.   ASSESSMENT/PLAN:  Carrie Myers is doing well s/p above surgery. Treatment options reviewed with patient and following plan made:   - I have advised the patient to lift up to 10 pounds until 6 weeks after surgery (follow up with Dr. Clois).  - Reviewed wound care.  - No bending, twisting, or lifting.  - Continue on current medications including ***.  - Preop nasal swab was positive for staph- patient is to continue mupirocin  and chloraprep x 6 weeks.  - Follow up as scheduled in 4 weeks and prn.   Advised to contact the office if any questions or concerns arise.  Glade Boys PA-C Department of neurosurgery "

## 2024-03-30 NOTE — Telephone Encounter (Signed)
 Date01/16/26 Provider [facility] Requesting Oxycodone  5mg  immediate release (she's been taking 1 in the am & 1 at night with her muscle relaxer)Pt is down to 2 pills left. Pharmacy [facility]. Patient contact [phone]  Context for pain: Needing to calm the pain down around her lower back / top of her buttocks. The meds are working but they are not wanting anything stronger.

## 2024-03-30 NOTE — Telephone Encounter (Signed)
 Patient notified and will pick up RX.

## 2024-03-30 NOTE — Telephone Encounter (Signed)
 DOS:  03/21/24  L4-5 XLIF and PSF   Given robaxin  and oxycodone  on discharge.   Refill for oxycodone  okay. PMP reviewed and is appropriate. Directions changed to q 6 hours prn pain. She should continue to take the least amount possible.

## 2024-04-04 ENCOUNTER — Encounter: Payer: Self-pay | Admitting: Orthopedic Surgery

## 2024-04-04 ENCOUNTER — Ambulatory Visit: Admitting: Orthopedic Surgery

## 2024-04-04 VITALS — BP 120/76 | Temp 97.9°F | Wt 183.0 lb

## 2024-04-04 DIAGNOSIS — Z981 Arthrodesis status: Secondary | ICD-10-CM

## 2024-04-04 DIAGNOSIS — M4316 Spondylolisthesis, lumbar region: Secondary | ICD-10-CM

## 2024-04-13 ENCOUNTER — Other Ambulatory Visit: Payer: Self-pay | Admitting: Anesthesiology

## 2024-04-16 ENCOUNTER — Telehealth: Payer: Self-pay | Admitting: Anesthesiology

## 2024-04-17 ENCOUNTER — Telehealth: Payer: Self-pay | Admitting: Neurosurgery

## 2024-04-17 ENCOUNTER — Telehealth: Payer: Self-pay | Admitting: Anesthesiology

## 2024-04-17 DIAGNOSIS — M4316 Spondylolisthesis, lumbar region: Secondary | ICD-10-CM

## 2024-04-17 DIAGNOSIS — Z981 Arthrodesis status: Secondary | ICD-10-CM

## 2024-04-17 MED ORDER — OXYCODONE HCL 5 MG PO TABS: 5.0000 mg | ORAL_TABLET | Freq: Three times a day (TID) | ORAL | 0 refills | Status: AC | PRN

## 2024-04-17 NOTE — Telephone Encounter (Signed)
 I spoke with the patient and notified her about the prescription and about the directions changing. She verbalized understanding.

## 2024-04-17 NOTE — Telephone Encounter (Signed)
 Dr. Myra will do a VV as long as insurance approves.

## 2024-04-17 NOTE — Telephone Encounter (Signed)
 Pt wants to know could she do a VV appt. She hasn't been here in awhile.

## 2024-05-01 ENCOUNTER — Encounter: Admitting: Neurosurgery

## 2024-05-01 ENCOUNTER — Other Ambulatory Visit

## 2024-05-08 ENCOUNTER — Other Ambulatory Visit

## 2024-05-08 ENCOUNTER — Encounter: Admitting: Neurosurgery

## 2024-05-22 ENCOUNTER — Encounter: Admitting: Student

## 2024-06-04 ENCOUNTER — Other Ambulatory Visit

## 2024-06-04 ENCOUNTER — Encounter: Admitting: Orthopedic Surgery

## 2024-07-26 ENCOUNTER — Ambulatory Visit
